# Patient Record
Sex: Female | Born: 1975 | Race: White | Hispanic: No | Marital: Married
Health system: Southern US, Academic
[De-identification: ages and names within clinical notes are randomized; demographics above are authoritative.]

## PROBLEM LIST (undated history)

## (undated) ENCOUNTER — Ambulatory Visit

## (undated) ENCOUNTER — Encounter

## (undated) DIAGNOSIS — T8859XA Other complications of anesthesia, initial encounter: Secondary | ICD-10-CM

## (undated) DIAGNOSIS — I890 Lymphedema, not elsewhere classified: Secondary | ICD-10-CM

## (undated) DIAGNOSIS — M069 Rheumatoid arthritis, unspecified: Secondary | ICD-10-CM

## (undated) DIAGNOSIS — I82409 Acute embolism and thrombosis of unspecified deep veins of unspecified lower extremity: Secondary | ICD-10-CM

## (undated) DIAGNOSIS — K529 Noninfective gastroenteritis and colitis, unspecified: Secondary | ICD-10-CM

## (undated) DIAGNOSIS — K3184 Gastroparesis: Secondary | ICD-10-CM

## (undated) DIAGNOSIS — T4145XA Adverse effect of unspecified anesthetic, initial encounter: Secondary | ICD-10-CM

## (undated) DIAGNOSIS — C55 Malignant neoplasm of uterus, part unspecified: Secondary | ICD-10-CM

## (undated) HISTORY — PX: CHOLECYSTECTOMY: SHX55

## (undated) HISTORY — PX: APPENDECTOMY: SHX54

## (undated) HISTORY — PX: KNEE SURGERY: SHX244

## (undated) HISTORY — DX: Malignant neoplasm of uterus, part unspecified: C55

## (undated) HISTORY — PX: HERNIA REPAIR: SHX51

## (undated) HISTORY — PX: TONSILLECTOMY: SUR1361

## (undated) HISTORY — PX: MINIMALLY INVASIVE FORAMINOTOMY CERVICAL SPINE: SHX2036

## (undated) HISTORY — DX: Acute embolism and thrombosis of unspecified deep veins of unspecified lower extremity: I82.409

## (undated) HISTORY — PX: TOTAL ABDOMINAL HYSTERECTOMY: SHX209

---
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
## 2005-03-01 ENCOUNTER — Ambulatory Visit: Admission: RE | Admit: 2005-03-01 | Discharge: 2005-03-01 | Payer: Self-pay | Admitting: Gynecologic Oncology

## 2005-03-31 ENCOUNTER — Ambulatory Visit: Payer: Self-pay | Admitting: Internal Medicine

## 2005-06-14 ENCOUNTER — Encounter (INDEPENDENT_AMBULATORY_CARE_PROVIDER_SITE_OTHER): Payer: Self-pay | Admitting: Specialist

## 2005-06-14 ENCOUNTER — Ambulatory Visit (HOSPITAL_COMMUNITY): Admission: RE | Admit: 2005-06-14 | Discharge: 2005-06-14 | Payer: Self-pay | Admitting: Gynecologic Oncology

## 2005-06-14 ENCOUNTER — Ambulatory Visit: Admission: RE | Admit: 2005-06-14 | Discharge: 2005-06-14 | Payer: Self-pay | Admitting: Gynecologic Oncology

## 2005-09-06 ENCOUNTER — Ambulatory Visit: Admission: RE | Admit: 2005-09-06 | Discharge: 2005-09-06 | Payer: Self-pay | Admitting: Gynecologic Oncology

## 2005-12-06 ENCOUNTER — Ambulatory Visit: Admission: RE | Admit: 2005-12-06 | Discharge: 2005-12-06 | Payer: Self-pay | Admitting: Gynecologic Oncology

## 2005-12-06 ENCOUNTER — Encounter (INDEPENDENT_AMBULATORY_CARE_PROVIDER_SITE_OTHER): Payer: Self-pay | Admitting: Specialist

## 2005-12-07 ENCOUNTER — Other Ambulatory Visit: Admission: RE | Admit: 2005-12-07 | Discharge: 2005-12-07 | Payer: Self-pay | Admitting: Gynecology

## 2006-02-01 ENCOUNTER — Emergency Department (HOSPITAL_COMMUNITY): Admission: EM | Admit: 2006-02-01 | Discharge: 2006-02-01 | Payer: Self-pay | Admitting: Family Medicine

## 2006-04-05 ENCOUNTER — Emergency Department (HOSPITAL_COMMUNITY): Admission: EM | Admit: 2006-04-05 | Discharge: 2006-04-05 | Payer: Self-pay | Admitting: Family Medicine

## 2006-04-11 ENCOUNTER — Other Ambulatory Visit: Admission: RE | Admit: 2006-04-11 | Discharge: 2006-04-11 | Payer: Self-pay | Admitting: Gynecologic Oncology

## 2006-04-11 ENCOUNTER — Encounter (INDEPENDENT_AMBULATORY_CARE_PROVIDER_SITE_OTHER): Payer: Self-pay | Admitting: *Deleted

## 2006-04-11 ENCOUNTER — Ambulatory Visit: Admission: RE | Admit: 2006-04-11 | Discharge: 2006-04-11 | Payer: Self-pay | Admitting: Gynecologic Oncology

## 2006-05-19 ENCOUNTER — Ambulatory Visit (HOSPITAL_COMMUNITY): Admission: RE | Admit: 2006-05-19 | Discharge: 2006-05-19 | Payer: Self-pay | Admitting: Orthopedic Surgery

## 2006-05-25 ENCOUNTER — Ambulatory Visit (HOSPITAL_COMMUNITY): Admission: RE | Admit: 2006-05-25 | Discharge: 2006-05-25 | Payer: Self-pay | Admitting: Gynecologic Oncology

## 2006-08-06 ENCOUNTER — Emergency Department (HOSPITAL_COMMUNITY): Admission: EM | Admit: 2006-08-06 | Discharge: 2006-08-06 | Payer: Self-pay | Admitting: Family Medicine

## 2006-08-26 ENCOUNTER — Ambulatory Visit (HOSPITAL_COMMUNITY): Admission: RE | Admit: 2006-08-26 | Discharge: 2006-08-26 | Payer: Self-pay | Admitting: Orthopedic Surgery

## 2006-09-13 ENCOUNTER — Ambulatory Visit: Admission: RE | Admit: 2006-09-13 | Discharge: 2006-09-13 | Payer: Self-pay | Admitting: Gynecologic Oncology

## 2006-09-13 ENCOUNTER — Encounter (INDEPENDENT_AMBULATORY_CARE_PROVIDER_SITE_OTHER): Payer: Self-pay | Admitting: *Deleted

## 2006-09-13 ENCOUNTER — Other Ambulatory Visit: Admission: RE | Admit: 2006-09-13 | Discharge: 2006-09-13 | Payer: Self-pay | Admitting: Gynecologic Oncology

## 2006-09-21 ENCOUNTER — Emergency Department (HOSPITAL_COMMUNITY): Admission: EM | Admit: 2006-09-21 | Discharge: 2006-09-21 | Payer: Self-pay | Admitting: Family Medicine

## 2006-12-25 ENCOUNTER — Emergency Department (HOSPITAL_COMMUNITY): Admission: EM | Admit: 2006-12-25 | Discharge: 2006-12-25 | Payer: Self-pay | Admitting: Family Medicine

## 2007-01-11 ENCOUNTER — Ambulatory Visit (HOSPITAL_BASED_OUTPATIENT_CLINIC_OR_DEPARTMENT_OTHER): Admission: RE | Admit: 2007-01-11 | Discharge: 2007-01-11 | Payer: Self-pay | Admitting: Orthopedic Surgery

## 2007-02-28 ENCOUNTER — Ambulatory Visit: Admission: RE | Admit: 2007-02-28 | Discharge: 2007-02-28 | Payer: Self-pay | Admitting: Gynecologic Oncology

## 2007-02-28 ENCOUNTER — Other Ambulatory Visit: Admission: RE | Admit: 2007-02-28 | Discharge: 2007-02-28 | Payer: Self-pay | Admitting: Gynecologic Oncology

## 2007-02-28 ENCOUNTER — Encounter (INDEPENDENT_AMBULATORY_CARE_PROVIDER_SITE_OTHER): Payer: Self-pay | Admitting: *Deleted

## 2007-06-12 ENCOUNTER — Ambulatory Visit (HOSPITAL_COMMUNITY): Admission: RE | Admit: 2007-06-12 | Discharge: 2007-06-12 | Payer: Self-pay | Admitting: Sports Medicine

## 2007-06-29 ENCOUNTER — Emergency Department (HOSPITAL_COMMUNITY): Admission: EM | Admit: 2007-06-29 | Discharge: 2007-06-29 | Payer: Self-pay | Admitting: Family Medicine

## 2007-07-16 ENCOUNTER — Ambulatory Visit: Payer: Self-pay | Admitting: Family Medicine

## 2007-07-16 ENCOUNTER — Telehealth (INDEPENDENT_AMBULATORY_CARE_PROVIDER_SITE_OTHER): Payer: Self-pay | Admitting: *Deleted

## 2007-07-16 DIAGNOSIS — Z9889 Other specified postprocedural states: Secondary | ICD-10-CM | POA: Insufficient documentation

## 2007-07-16 DIAGNOSIS — Z8542 Personal history of malignant neoplasm of other parts of uterus: Secondary | ICD-10-CM | POA: Insufficient documentation

## 2007-07-19 ENCOUNTER — Telehealth (INDEPENDENT_AMBULATORY_CARE_PROVIDER_SITE_OTHER): Payer: Self-pay | Admitting: *Deleted

## 2007-07-20 ENCOUNTER — Ambulatory Visit: Payer: Self-pay | Admitting: Family Medicine

## 2007-07-20 DIAGNOSIS — E28319 Asymptomatic premature menopause: Secondary | ICD-10-CM | POA: Insufficient documentation

## 2007-07-23 ENCOUNTER — Telehealth: Payer: Self-pay | Admitting: Family Medicine

## 2007-07-25 ENCOUNTER — Ambulatory Visit: Payer: Self-pay | Admitting: Family Medicine

## 2007-07-25 LAB — CONVERTED CEMR LAB
Blood in Urine, dipstick: NEGATIVE
Glucose, Urine, Semiquant: NEGATIVE
Nitrite: POSITIVE
Protein, U semiquant: NEGATIVE
Specific Gravity, Urine: <1.005
Urobilinogen, UA: NEGATIVE
WBC Urine, dipstick: NEGATIVE

## 2007-07-27 ENCOUNTER — Ambulatory Visit (HOSPITAL_COMMUNITY): Admission: RE | Admit: 2007-07-27 | Discharge: 2007-07-29 | Payer: Self-pay | Admitting: Specialist

## 2007-09-24 ENCOUNTER — Telehealth (INDEPENDENT_AMBULATORY_CARE_PROVIDER_SITE_OTHER): Payer: Self-pay | Admitting: *Deleted

## 2007-09-25 ENCOUNTER — Ambulatory Visit: Payer: Self-pay | Admitting: Family Medicine

## 2007-09-25 ENCOUNTER — Encounter (INDEPENDENT_AMBULATORY_CARE_PROVIDER_SITE_OTHER): Payer: Self-pay | Admitting: *Deleted

## 2007-09-25 DIAGNOSIS — J019 Acute sinusitis, unspecified: Secondary | ICD-10-CM

## 2007-09-26 ENCOUNTER — Emergency Department (HOSPITAL_COMMUNITY): Admission: EM | Admit: 2007-09-26 | Discharge: 2007-09-26 | Payer: Self-pay | Admitting: Family Medicine

## 2007-11-01 ENCOUNTER — Emergency Department (HOSPITAL_COMMUNITY): Admission: EM | Admit: 2007-11-01 | Discharge: 2007-11-01 | Payer: Self-pay | Admitting: Family Medicine

## 2007-11-02 ENCOUNTER — Telehealth: Payer: Self-pay | Admitting: Family Medicine

## 2007-11-03 ENCOUNTER — Emergency Department (HOSPITAL_COMMUNITY): Admission: EM | Admit: 2007-11-03 | Discharge: 2007-11-03 | Payer: Self-pay | Admitting: Emergency Medicine

## 2007-11-05 ENCOUNTER — Ambulatory Visit: Payer: Self-pay

## 2007-11-09 ENCOUNTER — Encounter: Admission: RE | Admit: 2007-11-09 | Discharge: 2007-11-09 | Payer: Self-pay | Admitting: Internal Medicine

## 2007-11-16 ENCOUNTER — Telehealth: Payer: Self-pay | Admitting: Family Medicine

## 2007-11-18 ENCOUNTER — Emergency Department (HOSPITAL_COMMUNITY): Admission: EM | Admit: 2007-11-18 | Discharge: 2007-11-18 | Payer: Self-pay | Admitting: Family Medicine

## 2007-11-20 ENCOUNTER — Telehealth (INDEPENDENT_AMBULATORY_CARE_PROVIDER_SITE_OTHER): Payer: Self-pay | Admitting: *Deleted

## 2007-11-21 ENCOUNTER — Emergency Department (HOSPITAL_COMMUNITY): Admission: EM | Admit: 2007-11-21 | Discharge: 2007-11-21 | Payer: Self-pay | Admitting: Emergency Medicine

## 2007-12-01 ENCOUNTER — Ambulatory Visit: Payer: Self-pay | Admitting: Family Medicine

## 2007-12-01 DIAGNOSIS — L03039 Cellulitis of unspecified toe: Secondary | ICD-10-CM

## 2007-12-01 DIAGNOSIS — L02619 Cutaneous abscess of unspecified foot: Secondary | ICD-10-CM | POA: Insufficient documentation

## 2007-12-07 ENCOUNTER — Ambulatory Visit (HOSPITAL_COMMUNITY): Admission: RE | Admit: 2007-12-07 | Discharge: 2007-12-07 | Payer: Self-pay | Admitting: Internal Medicine

## 2007-12-07 ENCOUNTER — Ambulatory Visit: Payer: Self-pay | Admitting: Internal Medicine

## 2007-12-17 ENCOUNTER — Telehealth (INDEPENDENT_AMBULATORY_CARE_PROVIDER_SITE_OTHER): Payer: Self-pay | Admitting: *Deleted

## 2007-12-21 ENCOUNTER — Emergency Department (HOSPITAL_COMMUNITY): Admission: EM | Admit: 2007-12-21 | Discharge: 2007-12-21 | Payer: Self-pay | Admitting: Family Medicine

## 2007-12-28 ENCOUNTER — Telehealth: Payer: Self-pay | Admitting: Internal Medicine

## 2007-12-31 ENCOUNTER — Emergency Department (HOSPITAL_COMMUNITY): Admission: EM | Admit: 2007-12-31 | Discharge: 2008-01-01 | Payer: Self-pay | Admitting: Emergency Medicine

## 2008-01-07 ENCOUNTER — Telehealth: Payer: Self-pay | Admitting: Internal Medicine

## 2008-01-07 ENCOUNTER — Encounter
Admission: RE | Admit: 2008-01-07 | Discharge: 2008-01-28 | Payer: Self-pay | Admitting: Physical Medicine & Rehabilitation

## 2008-01-19 ENCOUNTER — Emergency Department (HOSPITAL_COMMUNITY): Admission: EM | Admit: 2008-01-19 | Discharge: 2008-01-19 | Payer: Self-pay | Admitting: Emergency Medicine

## 2008-01-25 ENCOUNTER — Ambulatory Visit: Payer: Self-pay | Admitting: Physical Medicine & Rehabilitation

## 2008-02-05 ENCOUNTER — Encounter: Admission: RE | Admit: 2008-02-05 | Discharge: 2008-05-05 | Payer: Self-pay | Admitting: Anesthesiology

## 2008-02-05 ENCOUNTER — Ambulatory Visit: Payer: Self-pay | Admitting: Anesthesiology

## 2008-02-26 ENCOUNTER — Ambulatory Visit: Payer: Self-pay | Admitting: Physical Medicine & Rehabilitation

## 2008-02-28 ENCOUNTER — Ambulatory Visit: Payer: Self-pay | Admitting: Family Medicine

## 2008-03-03 ENCOUNTER — Ambulatory Visit: Payer: Self-pay | Admitting: Internal Medicine

## 2008-03-03 DIAGNOSIS — M940 Chondrocostal junction syndrome [Tietze]: Secondary | ICD-10-CM

## 2008-03-12 ENCOUNTER — Ambulatory Visit: Payer: Self-pay | Admitting: Physical Medicine & Rehabilitation

## 2008-03-25 ENCOUNTER — Ambulatory Visit: Payer: Self-pay | Admitting: Internal Medicine

## 2008-03-29 ENCOUNTER — Emergency Department (HOSPITAL_COMMUNITY): Admission: EM | Admit: 2008-03-29 | Discharge: 2008-03-29 | Payer: Self-pay | Admitting: Family Medicine

## 2008-04-01 ENCOUNTER — Ambulatory Visit: Payer: Self-pay | Admitting: Anesthesiology

## 2008-04-07 ENCOUNTER — Encounter
Admission: RE | Admit: 2008-04-07 | Discharge: 2008-04-07 | Payer: Self-pay | Admitting: Physical Medicine & Rehabilitation

## 2008-04-09 ENCOUNTER — Telehealth: Payer: Self-pay | Admitting: Internal Medicine

## 2008-04-23 ENCOUNTER — Telehealth (INDEPENDENT_AMBULATORY_CARE_PROVIDER_SITE_OTHER): Payer: Self-pay | Admitting: *Deleted

## 2008-04-29 ENCOUNTER — Ambulatory Visit: Payer: Self-pay | Admitting: Anesthesiology

## 2009-05-27 ENCOUNTER — Encounter (INDEPENDENT_AMBULATORY_CARE_PROVIDER_SITE_OTHER): Payer: Self-pay | Admitting: Gynecologic Oncology

## 2009-05-27 ENCOUNTER — Other Ambulatory Visit: Admission: RE | Admit: 2009-05-27 | Discharge: 2009-05-27 | Payer: Self-pay | Admitting: Gynecologic Oncology

## 2009-05-27 ENCOUNTER — Ambulatory Visit: Admission: RE | Admit: 2009-05-27 | Discharge: 2009-05-27 | Payer: Self-pay | Admitting: Gynecologic Oncology

## 2009-05-28 ENCOUNTER — Ambulatory Visit (HOSPITAL_COMMUNITY): Admission: RE | Admit: 2009-05-28 | Discharge: 2009-05-28 | Payer: Self-pay | Admitting: Gynecologic Oncology

## 2009-09-24 ENCOUNTER — Emergency Department (HOSPITAL_COMMUNITY): Admission: EM | Admit: 2009-09-24 | Discharge: 2009-09-24 | Payer: Self-pay | Admitting: Emergency Medicine

## 2009-10-15 ENCOUNTER — Emergency Department (HOSPITAL_COMMUNITY): Admission: EM | Admit: 2009-10-15 | Discharge: 2009-10-15 | Payer: Self-pay | Admitting: Emergency Medicine

## 2009-11-06 ENCOUNTER — Emergency Department (HOSPITAL_COMMUNITY): Admission: EM | Admit: 2009-11-06 | Discharge: 2009-11-06 | Payer: Self-pay | Admitting: Family Medicine

## 2009-12-21 ENCOUNTER — Emergency Department (HOSPITAL_COMMUNITY): Admission: EM | Admit: 2009-12-21 | Discharge: 2009-12-21 | Payer: Self-pay | Admitting: Emergency Medicine

## 2010-03-25 ENCOUNTER — Emergency Department (HOSPITAL_COMMUNITY): Admission: EM | Admit: 2010-03-25 | Discharge: 2010-03-25 | Payer: Self-pay | Admitting: Family Medicine

## 2010-03-28 ENCOUNTER — Emergency Department (HOSPITAL_COMMUNITY): Admission: EM | Admit: 2010-03-28 | Discharge: 2010-03-28 | Payer: Self-pay | Admitting: Family Medicine

## 2010-04-26 ENCOUNTER — Emergency Department (HOSPITAL_COMMUNITY): Admission: EM | Admit: 2010-04-26 | Discharge: 2010-04-26 | Payer: Self-pay | Admitting: Family Medicine

## 2010-08-18 ENCOUNTER — Emergency Department (HOSPITAL_COMMUNITY): Admission: EM | Admit: 2010-08-18 | Discharge: 2010-08-18 | Payer: Self-pay | Admitting: Family Medicine

## 2010-09-28 ENCOUNTER — Emergency Department (HOSPITAL_COMMUNITY): Admission: EM | Admit: 2010-09-28 | Discharge: 2010-09-28 | Payer: Self-pay | Admitting: Family Medicine

## 2010-09-29 ENCOUNTER — Other Ambulatory Visit: Admission: RE | Admit: 2010-09-29 | Discharge: 2010-09-29 | Payer: Self-pay | Admitting: Gynecologic Oncology

## 2010-09-29 ENCOUNTER — Ambulatory Visit: Admission: RE | Admit: 2010-09-29 | Discharge: 2010-09-29 | Payer: Self-pay | Admitting: Gynecologic Oncology

## 2010-09-30 ENCOUNTER — Ambulatory Visit (HOSPITAL_COMMUNITY): Admission: RE | Admit: 2010-09-30 | Discharge: 2010-09-30 | Payer: Self-pay | Admitting: Gynecologic Oncology

## 2010-10-03 ENCOUNTER — Emergency Department (HOSPITAL_COMMUNITY): Admission: EM | Admit: 2010-10-03 | Discharge: 2010-10-03 | Payer: Self-pay | Admitting: Emergency Medicine

## 2010-10-10 ENCOUNTER — Emergency Department (HOSPITAL_COMMUNITY): Admission: EM | Admit: 2010-10-10 | Discharge: 2010-10-11 | Payer: Self-pay | Admitting: Emergency Medicine

## 2010-10-11 ENCOUNTER — Telehealth: Payer: Self-pay | Admitting: Family Medicine

## 2010-10-11 ENCOUNTER — Ambulatory Visit: Payer: Self-pay | Admitting: Family Medicine

## 2010-10-11 DIAGNOSIS — I82409 Acute embolism and thrombosis of unspecified deep veins of unspecified lower extremity: Secondary | ICD-10-CM | POA: Insufficient documentation

## 2010-10-11 DIAGNOSIS — F411 Generalized anxiety disorder: Secondary | ICD-10-CM | POA: Insufficient documentation

## 2010-10-14 ENCOUNTER — Ambulatory Visit: Payer: Self-pay | Admitting: Family Medicine

## 2010-10-20 ENCOUNTER — Ambulatory Visit: Payer: Self-pay | Admitting: Family Medicine

## 2010-10-20 LAB — CONVERTED CEMR LAB: INR: 2.4

## 2010-11-02 ENCOUNTER — Ambulatory Visit: Payer: Self-pay | Admitting: Family Medicine

## 2010-11-02 LAB — CONVERTED CEMR LAB: INR: 1.6

## 2010-11-03 ENCOUNTER — Emergency Department (HOSPITAL_COMMUNITY): Admission: EM | Admit: 2010-11-03 | Discharge: 2010-11-03 | Payer: Self-pay | Admitting: Emergency Medicine

## 2010-11-09 ENCOUNTER — Encounter (INDEPENDENT_AMBULATORY_CARE_PROVIDER_SITE_OTHER): Payer: Self-pay | Admitting: *Deleted

## 2010-11-24 ENCOUNTER — Ambulatory Visit: Payer: Self-pay | Admitting: Family Medicine

## 2010-11-24 DIAGNOSIS — M25579 Pain in unspecified ankle and joints of unspecified foot: Secondary | ICD-10-CM

## 2010-11-24 LAB — CONVERTED CEMR LAB: INR: 1.5

## 2010-12-06 ENCOUNTER — Telehealth (INDEPENDENT_AMBULATORY_CARE_PROVIDER_SITE_OTHER): Payer: Self-pay | Admitting: *Deleted

## 2010-12-08 ENCOUNTER — Ambulatory Visit: Payer: Self-pay | Admitting: Family Medicine

## 2010-12-15 ENCOUNTER — Emergency Department (HOSPITAL_COMMUNITY)
Admission: EM | Admit: 2010-12-15 | Discharge: 2010-12-15 | Payer: Self-pay | Source: Home / Self Care | Admitting: Emergency Medicine

## 2010-12-26 ENCOUNTER — Emergency Department (HOSPITAL_COMMUNITY)
Admission: EM | Admit: 2010-12-26 | Discharge: 2010-12-26 | Payer: Self-pay | Source: Home / Self Care | Admitting: Family Medicine

## 2010-12-28 ENCOUNTER — Encounter (INDEPENDENT_AMBULATORY_CARE_PROVIDER_SITE_OTHER): Payer: Self-pay | Admitting: *Deleted

## 2010-12-28 ENCOUNTER — Ambulatory Visit
Admission: RE | Admit: 2010-12-28 | Discharge: 2010-12-28 | Payer: Self-pay | Source: Home / Self Care | Attending: Family Medicine | Admitting: Family Medicine

## 2010-12-28 ENCOUNTER — Encounter: Payer: Self-pay | Admitting: Family Medicine

## 2011-01-27 NOTE — Letter (Signed)
Summary: Work Dietitian at [employer]  [address] [place], [place] [address]   Phone: [phone]  Fax: [phone]    Today's Date: October 20, 2010  Name of Patient: [name] Horn  The above named patient had a medical visit today at:  4 pm.  Please take this into consideration when reviewing the time away from work/school.    Special Instructions:  [  ] None  [  ] To be off the remainder of today, returning to the normal work / school schedule tomorrow.  [  ] To be off until the next scheduled appointment on ______________________.  [ x ] Other - can return to work w/out restrictions but should have a chair available for when she has pain in her leg.  Should not work double shifts at this time.   Sincerely yours,   Neena Rhymes MD

## 2011-01-27 NOTE — Assessment & Plan Note (Signed)
Summary: PT/INR//PH  Nurse Visit   Vital Signs:  Patient profile:   35 year old female Height:      64 inches Weight:      205 pounds Temp:     98.6 degrees F oral Pulse rate:   112 / minute BP sitting:   108 / 78  (left arm)  Vitals Entered By: Jeremy Johann CMA (October 14, 2010 3:51 PM)  Patient Instructions: 1)  Please schedule a follow-up appointment for Tuesday 2)  Take 10 mg thurs,fri, sat then 5 mg sun, mon and return to office on tuesday for PT/INR check. 3)  Continue with lovenox daily until recheck  CC: pt/inr, refill Comments [facility] [address]   Current Medications (verified): 1)  Prilosec Otc 20 Mg  Tbec (Omeprazole Magnesium) .Marland Kitchen.. 1 Once Daily 2)  Coumadin 5 Mg Tabs (Warfarin Sodium) .... Take As Directed 3)  Buspirone Hcl 15 Mg Tabs (Buspirone Hcl) .... 1/2 Tab Two Times A Day X1 Week and Then Increase To 1 Tab By Mouth Two Times A Day. 4)  Vicodin 5-500 Mg Tabs (Hydrocodone-Acetaminophen) .Marland Kitchen.. 1 Tab By Mouth Q6 As Needed For Severe Pain 5)  Alprazolam 0.5 Mg Tabs (Alprazolam) .Marland Kitchen.. 1 Tab By Mouth Q4 As Needed For Severe Anxiety 6)  Ventolin Hfa 108 (90 Base) Mcg/act Aers (Albuterol Sulfate) .... 2 Puffs Q4 As Needed For Wheezing  Allergies (verified): 1)  ! Sulfa Laboratory Results   Blood Tests    Date/Time Reported: October 14, 2010 4:08 PM   INR: 1.7   (Normal Range: 0.88-1.12   Therap INR: 2.0-3.5) Comments: Has not taking coumadin today    Orders Added: 1)  Est. Patient Level I [11914] 2)  Protime [[id number]] Prescriptions: LOVENOX 120 MG/0.8ML SOLN (ENOXAPARIN SODIUM) inject 120mg  daily subcutaneously   for 6 days  #1 vial x 0   Entered by:   Jeremy Johann CMA   Authorized by:   Neena Rhymes MD   Signed by:   Jeremy Johann CMA on 10/14/2010   Method used:   Printed then faxed to ...       [facility]  [address] # [id number]* (retail)       [address]       [place], [place]  [address]       Ph: [phone]  or [phone]       Fax: [phone]   RxID:   [id number] ALPRAZOLAM 0.5 MG TABS (ALPRAZOLAM) 1 tab by mouth q4 as needed for severe anxiety  #30 x 0   Entered by:   Jeremy Johann CMA   Authorized by:   Neena Rhymes MD   Signed by:   Jeremy Johann CMA on 10/14/2010   Method used:   Printed then faxed to ...       [facility]  [address] # [id number]* (retail)       [address]       [place], [place]  [address]       Ph: [phone] or [phone]       Fax: [phone]   RxID:   [id number] VICODIN 5-500 MG TABS (HYDROCODONE-ACETAMINOPHEN) 1 tab by mouth Q6 as needed for severe pain  #20 x 0   Entered and Authorized by:   Neena Rhymes MD   Signed by:   Neena Rhymes MD on 10/14/2010   Method used:   Print then Give to Patient  RxID:   [id number]     ANTICOAGULATION RECORD PREVIOUS REGIMEN & LAB RESULTS   Previous INR:  1.7 on  10/11/2010    NEW REGIMEN & LAB RESULTS Current INR: 1.7 Current Coumadin Dose(mg): 5 mg qd Regimen: 10 mg thurs,fri,sat then 5 mg sun, mon  Provider: tabori Repeat testing in: tuesday  Anticoagulation Visit Questionnaire Coumadin dose missed/changed:  No Abnormal Bleeding Symptoms:  No  Any diet changes including alcohol intake, vegetables or greens since the last visit:  No Any illnesses or hospitalizations since the last visit:  No Any signs of clotting since the last visit (including chest discomfort, dizziness, shortness of breath, arm tingling, slurred speech, swelling or redness in leg):  No  MEDICATIONS PRILOSEC OTC 20 MG  TBEC (OMEPRAZOLE MAGNESIUM) 1 once daily COUMADIN 5 MG TABS (WARFARIN SODIUM) take as directed BUSPIRONE HCL 15 MG TABS (BUSPIRONE HCL) 1/2 tab two times a day x1 week and then increase to 1 tab by mouth two times a day. VICODIN 5-500 MG TABS (HYDROCODONE-ACETAMINOPHEN) 1 tab by mouth Q6 as needed for severe pain ALPRAZOLAM 0.5 MG TABS (ALPRAZOLAM) 1 tab by mouth q4 as  needed for severe anxiety VENTOLIN HFA 108 (90 BASE) MCG/ACT AERS (ALBUTEROL SULFATE) 2 puffs q4 as needed for wheezing LOVENOX 120 MG/0.8ML SOLN (ENOXAPARIN SODIUM) inject 120mg  daily subcutaneously   for 6 days

## 2011-01-27 NOTE — Progress Notes (Signed)
Summary: Hydrocodon-Acetaminophen refill  Phone Note Refill Request Message from:  Fax from Pharmacy on December 06, 2010 9:36 AM  Refills Requested: Medication #1:  VICODIN 5-500 MG TABS 1 tab by mouth Q6 as needed for severe pain   [facility] [address], [place], [place]    phone=[phone]  fax =  [phone]   qty = 20  Next Appointment Scheduled: Wed 12/08/2010 = nurse's visit Initial call taken by: Jerolyn Shin,  December 06, 2010 9:37 AM  Follow-up for Phone Call        last filled 11.30.11.......Marland KitchenDoristine Devoid CMA  December 06, 2010 3:12 PM   Additional Follow-up for Phone Call Additional follow up Details #1::        no refills at this time.  if leg is still painful or swollen needs to see ortho. Additional Follow-up by: Neena Rhymes MD,  December 06, 2010 3:37 PM

## 2011-01-27 NOTE — Assessment & Plan Note (Signed)
Summary: [facility] F/U/scm   Vital Signs:  Patient profile:   35 year old female Height:      64 inches Weight:      207 pounds BMI:     35.66 Temp:     98.9 degrees F oral Pulse rate:   115 / minute BP sitting:   110 / 74  (left arm)  Vitals Entered By: Doristine Devoid CMA (October 11, 2010 1:31 PM) CC: hospital f/u- currently doing lovenox has last dose today previously being monitored by oncologist   History of Present Illness: 35 yo woman here today for hospital f/u.  PCP- Lowne.  Onc- Paola Gehrig  1) DVT- dx'd at [facility] on 9/27.  L calf x2.  normal chest CT.  started on Lovenox and coumadin.  wants [place] to follow coumadin.  alternates 5mg  Tues/Thurs/Sat w/ 2.5mg  M/W/F/Sun.  INR was 1.8 yesterday in ER.  took Lovenox again today.  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  Onc stopped hormone replacement (likely the cause of DVT) and stopped Zoloft.  2) Uterine Cancer- following yearly w/ Dr Duard Brady, s/p hysterectomy.  not currently on tx- radiation or chemo.  3) Fever- 3-4 days of low grade temps w/out cause.  no N/V/D.  + fatigue.  no nasal congestion or cough.  some SOB- hx of asthma, not using inhaler.  4) Anxiety- stopped Zoloft due to potential interaction w/ coumadin.  ER gave Xanax.  according to pt's chart, hx of misusing meds.    Current Medications (verified): 1)  Prilosec Otc 20 Mg  Tbec (Omeprazole Magnesium) .Marland Kitchen.. 1 Once Daily 2)  Percocet 5-325 Mg Tabs (Oxycodone-Acetaminophen) .... Take 1 Tablet Every 6 Hours As Needed 3)  Coumadin 5 Mg Tabs (Warfarin Sodium) .... Take As Directed  Allergies (verified): 1)  ! 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   Head:  normocephalic.  no TTP over sinuses Eyes:  no injxn or inflammation Ears:  R ear normal and L ear normal.   Nose:  mild congestion Mouth:  pharynx pink and moist.   Neck:  No deformities, masses, or tenderness noted. Lungs:  normal respiratory effort, no intercostal retractions, no accessory muscle use, and normal breath sounds.   Heart:  normal rate, regular rhythm, and no murmur.   Abdomen:  soft, NT/ND, +BS Pulses:  +2 carotid, radial, DP Extremities:  + TTP over L calf, no edema or erythema Cervical Nodes:  No lymphadenopathy noted Psych:  mildly anxious   Impression & Recommendations:  Problem # 1:  DVT (ICD-453.40) Assessment New this is pt's 2nd LE DVT.  no evidence of PE on CT yesterday.  will need lifetime coumadin tx.  given INR of 1.8 should continue Lovenox injxns.  given pain pt can take vicodin as needed but this should be short term. Orders: Protime ([id number])  Problem # 2:  UTERINE CANCER, HX OF (ICD-V10.42) Assessment: New following w/ oncology yearly.  Problem # 3:  FEVER (  ICD-780.60) Assessment: New likely viral given pt's vague sxs and lack of apparent bacterial infxn on exam.  temp normal in office today.  reviewed supportive care and red flags that should prompt return.  Pt expresses understanding and is in agreement w/ this plan.  Problem # 4:  ANXIETY (ICD-300.00) Assessment: New pt needs controller med rather than rescue medication.  since zoloft was stopped will start Buspar.  xanax only as needed for extreme anxiety.  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 Her updated medication list for this problem  includes:    Buspirone Hcl 15 Mg Tabs (Buspirone hcl) .Marland Kitchen... 1/2 tab two times a day x1 week and then increase to 1 tab by mouth two times a day.    Alprazolam 0.5 Mg Tabs (Alprazolam) .Marland Kitchen... 1 tab by mouth q4 as needed for severe anxiety  Complete Medication List: 1)  Prilosec Otc 20 Mg Tbec (Omeprazole magnesium) .Marland Kitchen.. 1 once daily 2)  Percocet 5-325 Mg Tabs (Oxycodone-acetaminophen) .... Take 1 tablet every 6 hours as needed 3)  Coumadin 5 Mg Tabs (Warfarin sodium) .... Take as directed 4)  Buspirone Hcl 15 Mg Tabs (Buspirone hcl) .... 1/2 tab two times a day x1 week and then increase to 1 tab by mouth two times a day. 5)  Vicodin 5-500 Mg Tabs (Hydrocodone-acetaminophen) .Marland Kitchen.. 1 tab by mouth q6 as needed for severe pain 6)  Alprazolam 0.5 Mg Tabs (Alprazolam) .Marland Kitchen.. 1 tab by mouth q4 as needed for severe anxiety 7)  Ventolin Hfa 108 (90 Base) Mcg/act Aers (Albuterol sulfate) .... 2 puffs q4 as needed for wheezing  Patient Instructions: 1)  Schedule a PT check for Thursday 2)  Schedule your complete physical w/ me at your convenience- do not eat before this appt 3)  Take 5 mg until you follow up on Thursday 4)  Continue the Lovenox until Thursday (or you run out) 5)  Start the Buspirone as directed for anxiety 6)  Only use the Alprazolam for panic attacks 7)  The Vicodin is only for severe pain- otherwise, use tylenol 8)  Heating pad will help with your leg pain 9)  Your low grade temp appears to be viral b/c your blood work from yesterday was all normal 10)  Call with any questions or concerns 11)  Hang in there!! Prescriptions: BUSPIRONE HCL 15 MG TABS (BUSPIRONE HCL) 1/2 tab two times a day x1 week and then increase to 1 tab by mouth two times a day.  #60 x 3   Entered and Authorized by:   Neena Rhymes MD   Signed by:   Neena Rhymes MD on 10/11/2010   Method used:   Reprint   RxID:   [id number] VENTOLIN HFA 108 (90 BASE) MCG/ACT AERS (ALBUTEROL SULFATE) 2 puffs q4 as  needed for wheezing  #1 x 3   Entered and Authorized by:   Neena Rhymes MD   Signed by:   Neena Rhymes MD on 10/11/2010   Method used:   Print then Give to Patient   RxID:   [id number] ALPRAZOLAM 0.5 MG TABS (ALPRAZOLAM) 1 tab by mouth q4 as needed for severe anxiety  #15 x 0   Entered and Authorized by:   Neena Rhymes MD   Signed by:   Neena Rhymes MD on 10/11/2010   Method used:   Print then Give to Patient   RxID:   [id number] VICODIN 5-500 MG TABS (HYDROCODONE-ACETAMINOPHEN) 1 tab by mouth Q6 as needed  for severe pain  #20 x 0   Entered and Authorized by:   Neena Rhymes MD   Signed by:   Neena Rhymes MD on 10/11/2010   Method used:   Print then Give to Patient   RxID:   [id number] BUSPIRONE HCL 15 MG TABS (BUSPIRONE HCL) 1/2 tab two times a day x1 week and then increase to 1 tab by mouth two times a day.  #60 x 3   Entered and Authorized by:   Neena Rhymes MD   Signed by:   Neena Rhymes MD on 10/11/2010   Method used:   Electronically to        [phone][facility] (retail)       [address]       [place], [place]  [address]       Ph: [phone]       Fax: [phone]   RxID:   [id number]    Orders Added: 1)  Protime [[id number]] 2)  Est. Patient Level IV [34742]    Laboratory Results   Blood Tests      INR: 1.7   (Normal Range: 0.88-1.12   Therap INR: 2.0-3.5)

## 2011-01-27 NOTE — Letter (Signed)
Summary: Generic Letter  [facility]  [address] [place], [place] [address]   Phone: [phone]  Fax: [phone]    12/28/2010  Yareni Cordy [address] [place], [place]  [address]  To Whom It May Concern,  Ms Oley has not been taking her Coumadin as directed and as of today her INR is 1 (normal).  This means there is absolutely no increased risk of bleeding.   Sincerely,   Neena Rhymes MD

## 2011-01-27 NOTE — Letter (Signed)
Summary: [facility] No Show Letter  [facility]  [address] [place], [place] [address]   Phone: [phone]  Fax: [phone]    11/09/2010 MRN: [id number]  Aubreyana Piascik [address] [place], [place]  [address]   Dear Ms. Horger,   Our records indicate that you missed your scheduled appointment with ______Dr.Tabori___________ on __11/15/11_________.  Please contact this office to reschedule your appointment as soon as possible.  It is important that you keep your scheduled appointments with your physician, so we can provide you the best care possible.  Please be advised that there may be a charge for "no show" appointments.    Sincerely,   [facility]

## 2011-01-27 NOTE — Assessment & Plan Note (Signed)
Summary: PT CHECK//PH  Nurse Visit   Vital Signs:  Patient profile:   35 year old female Height:      64 inches (162.56 cm) Weight:      202.25 pounds (91.93 kg) Temp:     98.6 degrees F (37.00 degrees C) oral  Vitals Entered By: Lucious Groves CMA (November 02, 2010 4:19 PM) CC: PT check./kb   Allergies: 1)  ! Sulfa Laboratory Results   Blood Tests      INR: 1.6   (Normal Range: 0.88-1.12   Therap INR: 2.0-3.5)    Orders Added: 1)  Est. Patient Level I [[address]] 2)  Protime [[id number]]   ANTICOAGULATION RECORD PREVIOUS REGIMEN & LAB RESULTS   Previous INR:  2.4 on  10/20/2010 Previous Coumadin Dose(mg):  5 mg qd on  10/20/2010 Previous Regimen:  (5mg ) 1 tab MWF,(2.5 mg) 1/2 tab all other days on  10/20/2010  NEW REGIMEN & LAB RESULTS Anticoag. Dx: Deep venous thrombosis Current INR: 1.6 Current Coumadin Dose(mg): 2.5mg  on W,F,Sun and 5mg  all other days Regimen: see below  Provider: Lowne Repeat testing in: 1 week Other Comments: Patient notes that she has missed approx. 4 doses and she has 5mg  tabs at home. She takes 2.5mg  on W,F,Sun and 5mg  all other days. Lucious Groves CMA  November 02, 2010 4:22 PM   Per Dr. Laury Axon, the patient will take 1.5 tabs today, 1 tab tomorrow, and then resume her previous schedule. Re-check in one week, any additional issues must be discussed with Dr. Beverely Low. Lucious Groves CMA  November 02, 2010 4:27 PM   Dose has been reviewed with patient or caretaker during this visit. Reviewed by: Lucious Groves  Anticoagulation Visit Questionnaire Coumadin dose missed/changed:  Yes Coumadin Dose Comments:  one or more missed dose(s) Abnormal Bleeding Symptoms:  No  Any diet changes including alcohol intake, vegetables or greens since the last visit:  No Any illnesses or hospitalizations since the last visit:  Yes      Recent Illness/Hospitalizations:  Patient notes that she has been very sick this week and missed several doses (approx 4) Any signs of  clotting since the last visit (including chest discomfort, dizziness, shortness of breath, arm tingling, slurred speech, swelling or redness in leg):  Yes      Signs of Clotting:  Patient notes that she continues to have pain and swelling of her leg. Adverse Events: Patient also notes still having issues with panic attacks  MEDICATIONS PRILOSEC OTC 20 MG  TBEC (OMEPRAZOLE MAGNESIUM) 1 once daily COUMADIN 5 MG TABS (WARFARIN SODIUM) take as directed BUSPIRONE HCL 15 MG TABS (BUSPIRONE HCL) 1/2 tab two times a day x1 week and then increase to 1 tab by mouth two times a day. VICODIN 5-500 MG TABS (HYDROCODONE-ACETAMINOPHEN) 1 tab by mouth Q6 as needed for severe pain ALPRAZOLAM 0.5 MG TABS (ALPRAZOLAM) 1 tab by mouth q4 as needed for severe anxiety VENTOLIN HFA 108 (90 BASE) MCG/ACT AERS (ALBUTEROL SULFATE) 2 puffs q4 as needed for wheezing

## 2011-01-27 NOTE — Assessment & Plan Note (Signed)
Summary: coumidin check///sph  Nurse Visit   Vital Signs:  Patient profile:   35 year old female Height:      64 inches Weight:      189 pounds Pulse rate:   78 / minute BP sitting:   106 / 76  (left arm)  Vitals Entered By: Jeremy Johann CMA (December 28, 2010 2:26 PM)  Patient Instructions: 1)  SCHEDULE A PT CHECK IN 2 WEEKS 2)  Please resume your coumadin immediately after your tooth extraction- take 2 pills daily 3)  You have had multiple blood clots and failure to take your coumadin as directed and follow up when scheduled could result in serious consequences- such as stroke, heart attack, and death. 4)  You can receive pain medicine from the dentist doing your tooth extraction 5)  Good luck with your procedure  CC: PT check   Current Medications (verified): 1)  Prilosec Otc 20 Mg  Tbec (Omeprazole Magnesium) .Marland Kitchen.. 1 Once Daily 2)  Coumadin 5 Mg Tabs (Warfarin Sodium) .... Take As Directed 3)  Buspirone Hcl 15 Mg Tabs (Buspirone Hcl) .... 1/2 Tab Two Times A Day X1 Week and Then Increase To 1 Tab By Mouth Two Times A Day. 4)  Vicodin 5-500 Mg Tabs (Hydrocodone-Acetaminophen) .Marland Kitchen.. 1 Tab By Mouth Q6 As Needed For Severe Pain 5)  Alprazolam 0.5 Mg Tabs (Alprazolam) .Marland Kitchen.. 1 Tab By Mouth Q4 As Needed For Severe Anxiety 6)  Ventolin Hfa 108 (90 Base) Mcg/act Aers (Albuterol Sulfate) .... 2 Puffs Q4 As Needed For Wheezing  Allergies (verified): 1)  ! Sulfa Laboratory Results   Blood Tests      INR: 1.1   (Normal Range: 0.88-1.12   Therap INR: 2.0-3.5)    Orders Added: 1)  Est. Patient Level I [08657] 2)  Protime [[id number]]    ANTICOAGULATION RECORD PREVIOUS REGIMEN & LAB RESULTS Anticoagulation Diagnosis:  Deep venous thrombosis on  11/02/2010  Previous INR:  1.5 on  11/24/2010 Previous Coumadin Dose(mg):  1 tab daily, 1 1/2 tab M,W,F on  11/24/2010 Previous Regimen:  see below on  11/02/2010  NEW REGIMEN & LAB RESULTS Current INR: 1.1 Current Coumadin  Dose(mg): (7.5 mg) 1 1/2 tab daily Regimen: see below  (no change)   Anticoagulation Visit Questionnaire Coumadin dose missed/changed:  Yes Coumadin Dose Comments:  one or more missed dose(s) Abnormal Bleeding Symptoms:  Yes    Bruising or bleeding from nose or gums, in urine or stool since the last visit:  gum bleeding with brushing Any diet changes including alcohol intake, vegetables or greens since the last visit:  Yes      Diet Comments:ate greens 2 day this week Any illnesses or hospitalizations since the last visit:  No Any signs of clotting since the last visit (including chest discomfort, dizziness, shortness of breath, arm tingling, slurred speech, swelling or redness in leg):  No  MEDICATIONS PRILOSEC OTC 20 MG  TBEC (OMEPRAZOLE MAGNESIUM) 1 once daily COUMADIN 5 MG TABS (WARFARIN SODIUM) take as directed BUSPIRONE HCL 15 MG TABS (BUSPIRONE HCL) 1/2 tab two times a day x1 week and then increase to 1 tab by mouth two times a day. VICODIN 5-500 MG TABS (HYDROCODONE-ACETAMINOPHEN) 1 tab by mouth Q6 as needed for severe pain ALPRAZOLAM 0.5 MG TABS (ALPRAZOLAM) 1 tab by mouth q4 as needed for severe anxiety VENTOLIN HFA 108 (90 BASE) MCG/ACT AERS (ALBUTEROL SULFATE) 2 puffs q4 as needed for wheezing

## 2011-01-27 NOTE — Assessment & Plan Note (Signed)
Summary: PT/INR/PH  Nurse Visit   Vital Signs:  Patient profile:   35 year old female Height:      64 inches Weight:      212 pounds Temp:     98.4 degrees F oral Pulse rate:   82 / minute BP sitting:   100 / 78  (left arm)  Vitals Entered By: Jeremy Johann CMA (October 20, 2010 4:21 PM) CC: pt/inr   Patient Instructions: 1)  Please schedule a follow-up appointment in 1 weeks for PT/INR.  2)  New coumadin dose (5mg ) 1 tab MWF,(2.5 mg) 1/2 tab all other days. 3)  Stop Lovenox. 4)  Take tylenol as needed for pain.   Current Medications (verified): 1)  Prilosec Otc 20 Mg  Tbec (Omeprazole Magnesium) .Marland Kitchen.. 1 Once Daily 2)  Coumadin 5 Mg Tabs (Warfarin Sodium) .... Take As Directed 3)  Buspirone Hcl 15 Mg Tabs (Buspirone Hcl) .... 1/2 Tab Two Times A Day X1 Week and Then Increase To 1 Tab By Mouth Two Times A Day. 4)  Vicodin 5-500 Mg Tabs (Hydrocodone-Acetaminophen) .Marland Kitchen.. 1 Tab By Mouth Q6 As Needed For Severe Pain 5)  Alprazolam 0.5 Mg Tabs (Alprazolam) .Marland Kitchen.. 1 Tab By Mouth Q4 As Needed For Severe Anxiety 6)  Ventolin Hfa 108 (90 Base) Mcg/act Aers (Albuterol Sulfate) .... 2 Puffs Q4 As Needed For Wheezing  Allergies (verified): 1)  ! Sulfa Laboratory Results   Blood Tests    Date/Time Reported: October 20, 2010 4:22 PM   INR: 2.4   (Normal Range: 0.88-1.12   Therap INR: 2.0-3.5)    Orders Added: 1)  Est. Patient Level I [47829] 2)  Protime [[id number]]  Prevention & Chronic Care Immunizations   Influenza vaccine: Not documented    Tetanus booster: Not documented    Pneumococcal vaccine: Not documented  Other Screening   Pap smear: Not documented   Smoking status: never  (07/16/2007)  Lipids   Total Cholesterol: Not documented   LDL: Not documented   LDL Direct: Not documented   HDL: Not documented   Triglycerides: Not documented    ANTICOAGULATION RECORD PREVIOUS REGIMEN & LAB RESULTS   Previous INR:  1.7 on  10/14/2010 Previous Coumadin  Dose(mg):  5 mg qd on  10/14/2010 Previous Regimen:  10 mg thurs,fri,sat then 5 mg sun, mon on  10/14/2010  NEW REGIMEN & LAB RESULTS Current INR: 2.4 Current Coumadin Dose(mg): 5 mg qd Regimen: (5mg ) 1 tab MWF,(2.5 mg) 1/2 tab all other days  Provider: tabori Repeat testing in: repeat 1 week  Anticoagulation Visit Questionnaire Coumadin dose missed/changed:  No Abnormal Bleeding Symptoms:  No  Any diet changes including alcohol intake, vegetables or greens since the last visit:  No Any illnesses or hospitalizations since the last visit:  No Any signs of clotting since the last visit (including chest discomfort, dizziness, shortness of breath, arm tingling, slurred speech, swelling or redness in leg):  No  MEDICATIONS PRILOSEC OTC 20 MG  TBEC (OMEPRAZOLE MAGNESIUM) 1 once daily COUMADIN 5 MG TABS (WARFARIN SODIUM) take as directed BUSPIRONE HCL 15 MG TABS (BUSPIRONE HCL) 1/2 tab two times a day x1 week and then increase to 1 tab by mouth two times a day. VICODIN 5-500 MG TABS (HYDROCODONE-ACETAMINOPHEN) 1 tab by mouth Q6 as needed for severe pain ALPRAZOLAM 0.5 MG TABS (ALPRAZOLAM) 1 tab by mouth q4 as needed for severe anxiety VENTOLIN HFA 108 (90 BASE) MCG/ACT AERS (ALBUTEROL SULFATE) 2 puffs q4 as needed for wheezing

## 2011-01-27 NOTE — Assessment & Plan Note (Signed)
Summary: PT CHECK/KN  Nurse Visit   Vital Signs:  Patient profile:   35 year old female Height:      64 inches Weight:      194 pounds BMI:     33.42 Pulse rate:   68 / minute BP sitting:   110 / 86  (left arm)  Vitals Entered By: Jeremy Johann CMA (November 24, 2010 4:39 PM) CC: PT/INR, pain and swelling in leg, refill pain med Comments Pt took med on Friday but vomited shortly afterward...Marland KitchenMarland KitchenFelecia Deloach CMA  November 24, 2010 4:43 PM    History of Present Illness: 35 yo woman here today for  1) Coumadin- subtherapeutic.  reports she vomited 1 dose and possibly missed another dose but otherwise has been compliant.  missed last PT check.    2) leg pain- L leg, from just below calf down to ankle.  having associated ankle swelling.  swelling will improve w/ elevation.  no known injury.  reports pain has been worsening over last couple weeks. has been making frequent trips to [facility].  no change in activity level.   has been taking coumadin w/out relief.   Review of Systems      See HPI   Patient Instructions: 1)  Please schedule a follow-up appointment in 2 weeks for PT check 2)  Increase Coumadin to 7.5mg  daily 3)  Your ankle is inflammed- continue to ice and elevate as able.  Wear your supportive shoes 4)  Continue the tylenol, vicodin for severe pain 5)  If no improvement in the next 2 weeks- call and we'll send you to ortho 6)  Hang in there!!!   Physical Exam  General:  alert, well-developed, and well-nourished.   Msk:  + TTP over L lateral malleolus w/ edema inferiorly and anteriorly.  full ROM.  no bruising or erythema.  (-) Homan's Pulses:  +2 DP/PT   Impression & Recommendations:  Problem # 1:  ANKLE, PAIN (ICD-719.47) Assessment New pt's pain doesn't appear to be related to DVT.  appears to be tendonitis or mild sprain.  unable to use NSAIDs due to coumadin (although she is subtherapeutic).  continue tylenol, vicodin only as needed for severe  pain.  reviewed supportive care and red flags that should prompt return. Pt expresses understanding and is in agreement w/ this plan.  Problem # 2:  ENCOUNTER FOR LONG-TERM USE OF ANTICOAGULANTS (ICD-V58.61) Assessment: Unchanged subtherapeutic.  increase dose and recheck in 2 weeks. Orders: Protime ([id number])  Complete Medication List: 1)  Prilosec Otc 20 Mg Tbec (Omeprazole magnesium) .Marland Kitchen.. 1 once daily 2)  Coumadin 5 Mg Tabs (Warfarin sodium) .... Take as directed 3)  Buspirone Hcl 15 Mg Tabs (Buspirone hcl) .... 1/2 tab two times a day x1 week and then increase to 1 tab by mouth two times a day. 4)  Vicodin 5-500 Mg Tabs (Hydrocodone-acetaminophen) .Marland Kitchen.. 1 tab by mouth q6 as needed for severe pain 5)  Alprazolam 0.5 Mg Tabs (Alprazolam) .Marland Kitchen.. 1 tab by mouth q4 as needed for severe anxiety 6)  Ventolin Hfa 108 (90 Base) Mcg/act Aers (Albuterol sulfate) .... 2 puffs q4 as needed for wheezing    Current Medications (verified): 1)  Prilosec Otc 20 Mg  Tbec (Omeprazole Magnesium) .Marland Kitchen.. 1 Once Daily 2)  Coumadin 5 Mg Tabs (Warfarin Sodium) .... Take As Directed 3)  Buspirone Hcl 15 Mg Tabs (Buspirone Hcl) .... 1/2 Tab Two Times A Day X1 Week and Then Increase To 1 Tab By Mouth Two Times  A Day. 4)  Vicodin 5-500 Mg Tabs (Hydrocodone-Acetaminophen) .Marland Kitchen.. 1 Tab By Mouth Q6 As Needed For Severe Pain 5)  Alprazolam 0.5 Mg Tabs (Alprazolam) .Marland Kitchen.. 1 Tab By Mouth Q4 As Needed For Severe Anxiety 6)  Ventolin Hfa 108 (90 Base) Mcg/act Aers (Albuterol Sulfate) .... 2 Puffs Q4 As Needed For Wheezing  Allergies (verified): 1)  ! Sulfa Laboratory Results   Blood Tests      INR: 1.5   (Normal Range: 0.88-1.12   Therap INR: 2.0-3.5)    Orders Added: 1)  Protime [[id number]] 2)  Est. Patient Level III [16109] Prescriptions: VICODIN 5-500 MG TABS (HYDROCODONE-ACETAMINOPHEN) 1 tab by mouth Q6 as needed for severe pain  #20 x 0   Entered and Authorized by:   Neena Rhymes MD   Signed by:   Neena Rhymes MD on 11/24/2010   Method used:   Print then Give to Patient   RxID:   [id number]     ANTICOAGULATION RECORD PREVIOUS REGIMEN & LAB RESULTS Anticoagulation Diagnosis:  Deep venous thrombosis on  11/02/2010  Previous INR:  1.6 on  11/02/2010 Previous Coumadin Dose(mg):  2.5mg  on W,F,Sun and 5mg  all other days on  11/02/2010 Previous Regimen:  see below on  11/02/2010  NEW REGIMEN & LAB RESULTS Current INR: 1.5 Current Coumadin Dose(mg): 1 tab daily, 1 1/2 tab M,W,F Regimen: see below  (no change)   Anticoagulation Visit Questionnaire Coumadin dose missed/changed:  Yes Coumadin Dose Comments:  one or more missed dose(s) Abnormal Bleeding Symptoms:  No  Any diet changes including alcohol intake, vegetables or greens since the last visit:  No Any illnesses or hospitalizations since the last visit:  No Any signs of clotting since the last visit (including chest discomfort, dizziness, shortness of breath, arm tingling, slurred speech, swelling or redness in leg):  No  MEDICATIONS PRILOSEC OTC 20 MG  TBEC (OMEPRAZOLE MAGNESIUM) 1 once daily COUMADIN 5 MG TABS (WARFARIN SODIUM) take as directed BUSPIRONE HCL 15 MG TABS (BUSPIRONE HCL) 1/2 tab two times a day x1 week and then increase to 1 tab by mouth two times a day. VICODIN 5-500 MG TABS (HYDROCODONE-ACETAMINOPHEN) 1 tab by mouth Q6 as needed for severe pain ALPRAZOLAM 0.5 MG TABS (ALPRAZOLAM) 1 tab by mouth q4 as needed for severe anxiety VENTOLIN HFA 108 (90 BASE) MCG/ACT AERS (ALBUTEROL SULFATE) 2 puffs q4 as needed for wheezing

## 2011-01-27 NOTE — Letter (Signed)
Summary: Out of Work  [facility]  [address] [place], [place] [address]   Phone: [phone]  Fax: [phone]    October 11, 2010   Employee:  Kadeshia M Hoelzer    To Whom It May Concern:   For Medical reasons, please excuse the above named employee from work for the following dates:  Start:   10/16  End:   10/20  If you need additional information, please feel free to contact our office.         Sincerely,    Neena Rhymes MD
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
## 2011-01-27 NOTE — Letter (Signed)
Summary: Primary Care Appointment Letter  [facility] [place]  [address] [place], [place] [address]   Phone: [phone]  Fax: [phone]    12/28/2010 MRN: [id number]  [name] Horn [address] [place], [place]  [address]  Dear Ms. Kinner,   Your Primary Care Physician Neena Rhymes MD has indicated that:    _______it is time to schedule an appointment.    ___X____you missed your appointment on_12/14/11 and need to call and          reschedule APPT YOU ARE PAST DUE FOR COUMADIN CHECK IT WAS LAST CHECKED 11/24/10. IT IS IMPORTANT THAT YOU F/U IMMEDIATELY.       Please call our office as soon as possible. Our phone number is [phone]          __[phone]______. Our office is open 8a-5p, Monday through Friday.     Thank you,    [facility] Primary Care Scheduler

## 2011-02-06 ENCOUNTER — Emergency Department (HOSPITAL_COMMUNITY)
Admission: EM | Admit: 2011-02-06 | Discharge: 2011-02-06 | Disposition: A | Discharge status: Home / Self Care | Payer: Self-pay | Attending: Emergency Medicine | Admitting: Emergency Medicine

## 2011-02-06 DIAGNOSIS — Z923 Personal history of irradiation: Secondary | ICD-10-CM | POA: Insufficient documentation

## 2011-02-06 DIAGNOSIS — J45909 Unspecified asthma, uncomplicated: Secondary | ICD-10-CM | POA: Insufficient documentation

## 2011-02-06 DIAGNOSIS — K089 Disorder of teeth and supporting structures, unspecified: Secondary | ICD-10-CM | POA: Insufficient documentation

## 2011-02-06 DIAGNOSIS — Z8542 Personal history of malignant neoplasm of other parts of uterus: Secondary | ICD-10-CM | POA: Insufficient documentation

## 2011-02-06 DIAGNOSIS — F411 Generalized anxiety disorder: Secondary | ICD-10-CM | POA: Insufficient documentation

## 2011-03-09 LAB — CBC
HCT: 38.2 % (ref 36.0–46.0)
MCH: 34.9 pg — ABNORMAL HIGH (ref 26.0–34.0)
MCHC: 34.7 g/dL (ref 30.0–36.0)
MCV: 100.5 fL — ABNORMAL HIGH (ref 78.0–100.0)
Platelets: 222 10*3/uL (ref 150–400)
RDW: 13.7 % (ref 11.5–15.5)

## 2011-03-09 LAB — PROTIME-INR
INR: 1.88 — ABNORMAL HIGH (ref 0.00–1.49)
Prothrombin Time: 21.8 seconds — ABNORMAL HIGH (ref 11.6–15.2)
Prothrombin Time: 30.9 seconds — ABNORMAL HIGH (ref 11.6–15.2)

## 2011-03-09 LAB — BASIC METABOLIC PANEL
BUN: 7 mg/dL (ref 6–23)
Chloride: 106 mEq/L (ref 96–112)
Creatinine, Ser: 0.77 mg/dL (ref 0.4–1.2)
Glucose, Bld: 113 mg/dL — ABNORMAL HIGH (ref 70–99)
Potassium: 4.3 mEq/L (ref 3.5–5.1)

## 2011-03-09 LAB — DIFFERENTIAL
Eosinophils Absolute: 0.2 10*3/uL (ref 0.0–0.7)
Eosinophils Relative: 2 % (ref 0–5)
Monocytes Absolute: 0.5 10*3/uL (ref 0.1–1.0)

## 2011-03-10 LAB — PROTIME-INR
INR: 2.21 — ABNORMAL HIGH (ref 0.00–1.49)
Prothrombin Time: 24.7 seconds — ABNORMAL HIGH (ref 11.6–15.2)

## 2011-04-01 LAB — POCT I-STAT, CHEM 8
BUN: 19 mg/dL (ref 6–23)
Creatinine, Ser: 0.6 mg/dL (ref 0.4–1.2)
Glucose, Bld: 96 mg/dL (ref 70–99)
Hemoglobin: 15.6 g/dL — ABNORMAL HIGH (ref 12.0–15.0)
TCO2: 26 mmol/L (ref 0–100)

## 2011-04-04 LAB — URINALYSIS, ROUTINE W REFLEX MICROSCOPIC
Bilirubin Urine: NEGATIVE
Glucose, UA: NEGATIVE mg/dL
Hgb urine dipstick: NEGATIVE
Protein, ur: NEGATIVE mg/dL
Specific Gravity, Urine: 1.006 (ref 1.005–1.030)
Urobilinogen, UA: 1 mg/dL (ref 0.0–1.0)

## 2011-04-04 LAB — URINE CULTURE: Colony Count: NO GROWTH

## 2011-04-04 LAB — URINE MICROSCOPIC-ADD ON
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
## 2011-05-10 NOTE — Assessment & Plan Note (Signed)
[facility]                                 ON-CALL NOTE   NAME:[name] Horn, [name] Horn                        MRN:          [id number]  DATE:12/27/2007                            DOB:          1976/01/31    DATE OF INTERACTION:  December 26, 2007, at 3:55 p.m.   PHONE NUMBER:  [phone]   Had a question about medicines.  She is on medication for a toe  infection, worried about osteomyelitis, is to see an orthopedic doctor  but has not seen him yet.  Has a past balance due with them from an  insurance problem and therefore has not seen orthopedics yet.  Dr. Drue Novel  has been giving her pain medications, according to her.  She left a  message with them this morning to try and get pain medicine and did not  hear back.  I actually checked in the EMR and she has been prescribed  Vicodin ES.  I see no phone note from today, however, but called in #15  Vicodin ES for her at [facility], at [phone], to be taken  until she can call the office.   PRIMARY CARE Damani Kelemen:  Dr. Drue Novel   HOME OFFICE:  Laren Boom, MD  Electronically Signed    RNS/MedQ  DD: 12/27/2007  DT: 12/27/2007  Job #: [id number]

## 2011-05-10 NOTE — Group Therapy Note (Signed)
REFERRING PHYSICIAN:  Kerrin Horn, M.D.   A 35 year old female who has neck pain onset in May of 2008.  She  initially told me that it was about a year ago.  Other documentation  indicates perhaps June of 2008.   CHIEF COMPLAINT:  Neck pain, left shoulder numbness, and left arm pain.   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  She was seen  by Dr. Farris Has from [facility].  She did see a  neurosurgeon as well, although I do not have the name.  She is treated  with Vicodin.  She was sent for MRI of the cervical spine on June 12, 2007, which showed disk protrusion at C6-7 with left foraminal  encroachment combined with osteophyte formation.  There was some mild  left foraminal narrowing at left C5-6 with a small central disk.  The  remainder of the levels showed no foraminal stenosis or central  stenosis.  She was seen by Dr. Otelia Sergeant in initial consultation who  recommended trial of Lyrica, Tramadol, and Vicodin.  The patient was  already tried on Soma and ibuprofen by that time.  She was noted to have  some mild weakness in left triceps function with wrist flexion.  She  underwent foraminotomy left C6-7 and left C7-T1 on July 27, 2007, and  postoperatively placed on OxyContin 20 mg and oxycodone.  She had no  postoperative complications.  She continued on Percocet for about one  month postoperatively and then switched to hydrocodone.  She was okay  for return to work half days after one month and then full-time after  about six weeks.  She worked until December, but states that she was let  go because of too many medical absences.  She was then placed on  restrictions for no more than 10-15 pound overhead lifting.  Goals were  for her to get off of pain medications.  The patient states that she  would not mind  getting off of her medicines.   Her average pain is rated 7-8 out of 10, interferes with activity at a  moderate level.  Sleep is poor.  The pain is worse with standing,  improves with rest and head as well as medications.  Relief from  medications is good.  She is able to climb steps.  She is able to drive.  She is independent with all self-care.  She needs some assistance with  certain household duties.   CURRENT MEDICATIONS:  1. Nortrel 7/7/7.  2. Phenoperidine t.i.d.  3. Tramadol 50 mg one p.o. q.6 hours. She really just takes about      three a day.  4. Hydrocodone 7.5 one to two q.8.  She really just takes about three      a day on average.  5. Over-the-counter Prilosec.  6. Ibuprofen 800 mg daily.   FURTHER TREATMENTS TRIED:  She has not had any formal physical therapy.  She has not had any TENS unit.  She indicates poor sleep due to pain in  the neck.   PAST SURGICAL HISTORY:  Right knee arthroscopy per [name] Horn. [name] Horn,  M.D. in January of 2008.  She had a TAH, BSO for endometrial stromal  tumor in 2004.   SOCIAL HISTORY:  Married.  She lives with her husband.  She has about  three glasses of wine a week.   FAMILY HISTORY:  Heart disease and high blood pressure.   Review of echart shows multiple ER visits for a number of recurrent  problems including ingrown toe nail, recurrent UTI, and cough.  She does  have a primary physician, but she does not always see them.   PHYSICAL EXAMINATION:  VITAL SIGNS:  Blood pressure 121/83, pulse 111,  respirations 18, O2 saturation 99% on room air.  GENERAL:  Mildly anxious female in no acute distress.  Alert and  oriented x3.  Gait is normal.  She is able to toe-walk and heel-walk.  NECK:  Range of motion is 75% range forward flexion, extension, lateral  rotation, and bending.  Tenderness in paraspinals  NEUROLOGY:  Her strength is 5/5 bilateral deltoids, biceps, triceps,  grips as well as hip flexor, knee extension, and  dorsiflexion.  She has  mildly diminished sensation to pinprick left deltoid area.  She has  negative Spurling's maneuver bilaterally.  Deep tendon reflexes are 2+  bilateral biceps, triceps, brachial radialis, knee and ankle.  BACK:  Lumbar spine range of motion full flex/ext, Lat bending, no  evidence of scoliosis, no tenderness to palpation  Her upper and lower extremity range of motion is normal at the  shoulders, elbows, wrists, hands, hips, knees, and ankles.  She does  have a dressing over her left great toe.  She has good pedal pulses  bilaterally.  Shoulder impingement signs are negative.   IMPRESSION:  1. Cervical post laminectomy syndrome.  Her pain is likely      multifactorial including diskogenic, cervical facet mediated, and      probable myofascial.  Given overall goal of reducing or eliminating      narcotic analgesic medications, we will first schedule for cervical      medical branch blocks, proceed to radiofrequency if only temporary      relief.  2. Initiate Ultram ER 200 mg per day.  3. Initiate Nortriptyline 10 q.h.s. for sleep as well as some of her      neuropathic component of pain in the left shoulder area.  4. After injections, consider PT referral for a TENS unit and a trial      of traction.   Thank you for this interesting consultation, I will keep you apprised of  her progress.  I have encouraged her to get back to work.  She states  that this is her goal as well.      [name] Horn, M.D.  Electronically Signed     AEK/MedQ  D:  01/28/2008 15:37:57  T:  01/29/2008 00:13:50  Job #:  [id number]   cc:   [name] Horn, M.D.  Fax: [phone]   Willow Ora, MD  [phone] [place], [place] [address]   G. Dorene Grebe, M.D.  Fax: [phone]   Excell Seltzer. Annabell Howells, M.D.  Fax: [phone]

## 2011-05-10 NOTE — Op Note (Signed)
NAME:  [name] Horn, [name] Horn               ACCOUNT NO.:  [id number]   MEDICAL RECORD NO.:  [id number]          PATIENT TYPE:  OIB   LOCATION:  5011                         FACILITY:  [facility]   PHYSICIAN:  [name], M.D.   DATE OF BIRTH:  04/21/1976   DATE OF PROCEDURE:  07/27/2007  DATE OF DISCHARGE:                               OPERATIVE REPORT   PREOPERATIVE DIAGNOSES:  1. Herniated nucleus pulposus, left C6-7, with left C7 nerve root      encroachment.  2. Herniated nucleus pulposus, left C7-T1.   POSTOPERATIVE DIAGNOSES:  1. 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  2. Left C7-T1 herniated nucleus pulposus, far lateral.   PROCEDURE:  Left C6-7 and left the C7-T1 [place] foraminotomies with  decompression the left C7 and C8 nerve roots.   SURGEON:  [name], M.D.   ASSISTANT:  Maud Deed, PA-C   ANESTHESIA:  General via orotracheal intubation, Dr. Krista Blue,  supplemented with local infiltration with Marcaine 0.5% with 1:200,000  epinephrine 10 mL.   DRAINS:  None.   BRIEF CLINICAL HISTORY:  The patient is a 35 year old right-handed  female.  She has been experiencing neck pain with radiation to the left  arm for and nearly 7 weeks.  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  She was seen  and evaluated by Dr. Farris Has as well as neurosurgery.  On evaluation she  was found to have left intrinsic nerve weakness, left triceps weakness.  Her reflexes remained normal.  Her MRI scan demonstrated a central  protruded disk at C5-6 with no significant cord compression and a very  minimal degree of impingement on the ventral surface of the thecal sac,  the appearance of a chronic disk bulge.  She was also found to have left-  sided C6-7, C7-T1 foraminal spurs with foraminal entrapment involving  the  left C7 and C8 nerve roots.  Attempts at conservative management  were performed.  She underwent steroid Dosepaks multiple times,  developed upper respiratory tract infection which was finally able to be  treated with Zosyn as well as Avelox and Z-Pak, eventually room  recovering from this.  She persists with numbness and weakness in her  left upper extremity intrinsics.  She was brought to the operating room  to undergo left-sided C6-7 and left-sided C7-T1 [place] foraminotomies.   DESCRIPTION OF PROCEDURE:  After adequate general anesthesia the patient  was placed into a prone position, chest rolls were used, the head on the  well-padded [place] horseshoe.  The shoulders taped downwards.  TED  support stockings to prevent DVT.  Standard preoperative antibiotics  with Ancef.  Standard prep with DuraPrep solution over the posterior  neck, extending from the occiput downwards to the upper dorsal spine.  She was draped in the usual manner.  Iodine Vi-Drape was used.  The  expected area of incision was first marked with the surgical marking  pen,  infiltrated with Marcaine 0.5%, 1:200,000 epinephrine.  Incision  made through skin and subcutaneous layers directly down to the prominent  spinous process present here.  This was then continued upwards in the  midline, exposing the next spinous process upwards, then placing a towel  clip on the upper level and an Allis clamp on the less visible prominent  spinous process within the incision.  Intraoperative lateral radiograph  demonstrated the Allis clamp on the C6 spinous process and the upper  clamp on the C5 spinous process.  Incision then was carried distally  caudally an additional 1 inch and exposure obtained over the left side,  the spinous process of C6, C7 and T1.  Electrocautery used to incise the  paracervical muscles off the lateral aspect of the left side the C6, C7  and T1 spinous processes and Cobb used to carefully elevate these   muscles.  These were carried out laterally to the patient's facets at C6-  7 and C7-T1.  Bleeders controlled using bipolar electrocautery.  Boss-  McCullough retractor was inserted in the incision for exposure of the  posterior aspect of the cervical spine, this portion of the procedure  performed using loupe magnification and headlights.  A high-speed bur  was then used to remove a small portion of the medial aspect of the  facet and inferior articular process of C6 and the inferior portion of  the lamina laterally.  A 3.8 mm diamond bur was used.  Additionally, the  superior portion of the lateral portion of the lamina of C7 and the  medial aspect, superior articular process, were carefully burred.  Additionally, this was done at the C7-T1 level as well, again using a  high-speed bur and removing the medial portion of the inferior articular  process of C7 and lateral aspect of the lamina, inferior aspect of C7.  The operating room microscope was then carefully sterilely draped,  brought into the field.  Under the microscope, then a 1 mm Kerrison was  used to carefully release the attachment of the ligamentum flavum from  the superior portion of the lamina of C7 and then the medial portion of  the superior articular process of C7 was carefully resected over about  10-15%.  This was carried superiorly.  The ligamentum flavum was  completely released from the superior portion of the lamina of C7 and a  micro titanium nerve hook was then used to lift the epidural veins away  from the thecal sac laterally and bipolar electrocautery used to divide  the epidural veins out laterally, releasing the fibrovascular leash and  over the posterior and medial aspect of the C7 nerve root.  This  released then the interval between the pedicle was C7, and the axillary  portion of the C7 nerve root was able to be identified.  There was fat  present.  A 3.0 straight microcurette was then used to remove a  small  portion of the superior medial border of the pedicle of C7, allowing for  easier access to the axillary area.  A nerve hook was then able to be  introduced within the axillary area of the nerve and then used to lift  the C7 nerve root superiorly and the uncovertebral joint was able to be  assessed, as was the disk on the left side of C6-7.  The disk itself did  not appear to be herniated.  There did appear to be prominence of the  uncovertebral joint with narrowing of the  neural foramen secondary to  this.  The neural foramen was able to be probed using a nerve hook and  demonstrated no further compression on the left C7 nerve root.  Thrombin-  soaked Gelfoam was applied into the axillary area along with bipolar  electrocautery of the epidural veins here to control bleeding.  Bone wax  applied to the bleeding cancellous bone surfaces.  Attention then turned  to the C7-T1 level, where similarly a bur had been used to thin the  medial aspect of the C7-T1 facet inferior articular process of C7 over  about 15%.  A 1-mm Kerrison used to resect this along with a portion of  the lateral aspect of the C7 inferior lamina.  The ligamentum flavum was  able to be resected off the superior surface of the T1 lamina and the  superior portion of the lamina of T1 also resected laterally using 1 and  2-mm Kerrisons.  The medial aspect of the superior articular process of  T1 was able to be partially facetectomized, again using 1 and 2-mm  Kerrisons.  The medial border of the superior portion of the T1 pedicle  identified, and this was partially removed using a 3.0 micro curette.  Approximately 10-15% of the C7-T1 facet was resected medially expose the  C8 nerve root within its neural foramen.  A micro titanium nerve hook  was used to elevate the epidural vein off of the thecal sac and then  bipolar cautery used to cauterize and divide the epidural veins,  releasing the C8 nerve root.  The  fibrovascular sheath holding the nerve  root ventral was also released.  The interval between the axillary  portion of the nerve root and the superior medial corner of the pedicle  was then easily identified, easily approached with a nerve hook.  Nerve  hook placed beneath the C8 nerve root used to elevate it superiorly.  The undersurface of the nerve root disk at the C7-T1 level was then  easily probed.  No particular disk herniation was noted to be posterior.  Primarily the disk appeared to be bulging within the neural foramen and  the fibrovascular sheath overlying this protrusion was carefully  released, decreasing any tendency for the nerve to be compressed against  this protrusion.  The disk itself was felt to be too far lateral to be  approached through the foraminotomy; however, foraminotomy alone  appeared to be adequate to decompress the nerve.  This allowed the nerve  root to float posterior to the disk protrusion.  This completed then, a  nerve hook could be carefully passed out the neural foramen,  demonstrating its patency and no nerve root compression here.  Bleeders  controlled using bipolar electrocautery and thrombin-soaked Gelfoam but  no active bleeding was present and following copious amounts of  irrigation, the portions of thrombin-soaked Gelfoam were removed  posteriorly.  There was no active bleeding present.  Soft tissues were  allowed to fall back into place.  The patient's ligamentum nuchae was  reapproximated in the midline with interrupted 0 Ethibond sutures, as  were the deep subcu layers.  More superficial layers approximated with  interrupted 0Vicryl sutures, then 2-0 Vicryl sutures, the skin closed  with a running subcu stitch of 4-0 Vicryl.  The patient then had  Dermabond applied.  Four by fours were fixed to the skin with Tegaderm.  The patient was then reactivated following return to a supine position,  extubated, and returned to the recovery room  in  satisfactory condition.  All instrument and sponge counts were correct.      [name], M.D.  Electronically Signed     JEN/MEDQ  D:  07/27/2007  T:  07/28/2007  Job:  [id number]

## 2011-05-10 NOTE — Assessment & Plan Note (Signed)
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  She has tried Lyrica, Tramadol and Vicodin.  She eventually underwent  foraminotomy left C6-7, left C7-T1 July 27, 2007, postoperatively on oxycodone and OxyContin.  When she saw me in  initial consultation, February 05, 2008, scheduled for cervical facet  injections but had a toe infection and was taking antibiotics, and  therefore, this had to be rescheduled.   She has restarted working in that interval time.  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  Does not do any lifting.  Passes out  medications mainly.   Her pain is still at a 7 out of 10 level basically at the neck and upper  shoulder and medial scapular area.  Interferes with activity at a  moderate level.  Sleep is poor.  Pain improves with rest and medications  however.  She does drive.  She does work.  She is independent with all  her self care.  Needs some assistance with certain household duties and  shopping.   MEDICATIONS:  1. Tramadol 3 tablets per day.  2. Norco 10/325 three to four tablets per day.  She does not think the tramadol is helping much.   REVIEW OF SYSTEMS:  Positive for numbness, tingling, spasms, night  sweats, painful urination.   Her blood pressure is 127/77, pulse 73, respirations 18, oxygen  saturation 100% on room air.  GENERAL:  No acute distress.  NECK:  Has 75% range of forward flexion, extension, lateral rotation,  bending.  She has normal sensation in the upper extremities.  Normal  deep tendon reflexes in the upper extremities, 5/5 strength in the  deltoid, biceps, triceps grip.  She has tenderness to palpation at the  medial scapular border on the left side greater than the right side.  She has negative Sperling's maneuver.   IMPRESSION:  1. Cervical postlaminectomy syndrome.  2. Cervical facet syndrome.  3. Probable cervical trapezius myofascial pain syndrome.   PLAN:  1.  Will get her going in some physical therapy right after her      cervical facet injections that are scheduled on April 7.  Will ask      her to trial cervical traction as well as TENS as well as cervical      thoracic stabilization.  2. Will wean off the Ultram.  3. Continue ibuprofen 800 t.i.d. but needs to stop 5 days prior to      injection.  4. Norco 10/325 t.i.d. or q.i.d. 100 for 1 month.  No signs of      aberrant drug behavior.      Erick Colace, M.D.  Electronically Signed     AEK/MedQ  D:  03/13/2008 10:42:53  T:  03/13/2008 12:33:44  Job #:  [id number]   cc:   [name], M.D.  Fax: [phone]   Willow Ora, MD  [phone] [place], [place] [address]

## 2011-05-10 NOTE — Consult Note (Signed)
[name] Horn, [name] Horn              ACCOUNT NO.:  [id number]   MEDICAL RECORD NO.:  [id number]          PATIENT TYPE:  OUT   LOCATION:  GYN                          FACILITY:  [facility]   PHYSICIAN:  John T. Kyla Balzarine, M.D.    DATE OF BIRTH:  1976/03/16   DATE OF CONSULTATION:  05/27/2009  DATE OF DISCHARGE:                                 CONSULTATION   CHIEF COMPLAINT:  Follow-up of stromal sarcoma.   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  All  treatment delivered in [place].  She relocated to [place] area.  Last examination was in March 2008 and was negative.  In  October/November 2009, she was explored for a cholecystectomy and had  extensive adhesiolysis with ventral hernia repair.  This has given her  problems with discomfort and occasional crampy pain.  She continues to  have issues with her frequent UTIs and interstitial cystitis, currently  having an exacerbation of urinary frequency.  She has occasional  diarrhea and continues to have dyspareunia and occasional postcoital  spotting because of vaginal atrophy.   CURRENT MEDICATIONS:  Nortrel daily, peridium and Duricef.   REVIEW OF SYSTEMS:  It should be noted that she had a few subcentimeter  pulmonary nodules seen on a chest CT scan that was performed in [facility] in November.  Recommendation was for 23-month follow-up.   PHYSICAL EXAMINATION:  Weight 169 pounds.  There was no supraclavicular or inguinal adenopathy.  No back or CVA  tenderness.  The abdomen was soft and benign with well-healed midline incision.  There is a diastasis in the umbilical region, without distinct hernia.  This is slightly tender to palpation.  No ascites or mass palpable.  EXTREMITIES:  Full strength and range of motion without edema, cords or  Homan's.  PELVIC:  External  genitalia, BUS, bladder and urethra normal.  The  vagina is extremely atrophic and somewhat agglutinated at the apex with  synechia broken down with speculum.  Radiation changes evident.  Bimanual and rectovaginal examinations reveal no mass or nodularity.   LABORATORY DATA:  Urine was obtained because symptoms and sent for C and  S and urinalysis.   ASSESSMENT:  Stromal sarcoma of the uterus, no evidence of disease  clinically.  Small pulmonary lesions on CT of the chest.  Estrogen  deficiency.   PLAN:  The patient was given refills for Nortrel.  Also prescription for  vaginal Premarin and a prescription for Macrobid in the event she has  positive urinary culture.  We can see her back for follow-up in 6 months  and recommend CT of the chest be obtained within the near future.      John T. Kyla Balzarine, M.D.  Electronically Signed     JTS/MEDQ  D:  05/27/2009  T:  05/27/2009  Job:  [id number]   cc:   Excell Seltzer. Annabell Howells, M.D.  Fax: [phone]   Telford Nab, R.N.  [address]  [place], [place] [address]  Darius Bump, M.D.  Fax: [phone]

## 2011-05-10 NOTE — Procedures (Signed)
NAME:  [name] Horn, [name] Horn              ACCOUNT NO.:  [id number]   MEDICAL RECORD NO.:  [id number]           PATIENT TYPE:   LOCATION:                                 FACILITY:   PHYSICIAN:  Celene Kras, MD        DATE OF BIRTH:  [birth year]   DATE OF PROCEDURE:  04/01/2008  DATE OF DISCHARGE:                               OPERATIVE REPORT   [name]. I  evaluated her __________  history of 14 point review of systems.   1. No evidence of infection.  We are going to go on to cervical facet      medial branch intervention, most problematic to the right side,      demonstrating spondylosis, minimal myelopathy, right side 4, 5, 6      and 7.  Predicate further intervention based on need.  We will      block four levels to affect block at 5, 6 and 7,  most problematic      levels, with contributory innervation addressed.  2. Other modifiable features and health profile reviewed.  Home based      therapy.  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  She needs to be as      functional as possible.  We want to minimize escalation of      controlled substances which is a goal for [place], but she is using      sparingly and appropriately, I will go ahead and renew.  3. Cautions as to medications working.  4. Consider Flector patch or topicals.  5. Consider second intervention as a sequential block, with the      possibility going to RF with positive predictive experience.   Objectively diffuse paracervical myofascial, positive cervical fetal  compression test particularly right greater than left with suprascapular  and levator scapular amplification side bending.  Nothing new  neurologically.   IMPRESSION:  Spondylosis, cervical.   PLAN:  Cervical facet medial branch intervention 4, 5, 6 and 7 with  contributory innervation addressed under local anesthetic and she has  consented.   Predicate further intervention based on need and overall  response.  Questions were answered, discussed in lay terms.  Follow-up  in 1 month.   The patient was taken to the fluoroscopy suite and placed in the supine  position. The neck prepped and draped in the usual fashion.  Using a 25  gauge needle I advanced the cervical facet medial branch at 4, 5, 6 and  7 under a local anesthetic, right side and she tolerated well.  Did  inject 0.5 mL lidocaine 1% MPF at each level, independent needle access  points, with a total of 40 mg of Aristocort in divided dose.   Tolerated the procedure well.  No complications from our procedure.  Appropriate discharge instructions given.  Assess within context of  activities of daily living.  ______________________________  Celene Kras, MD     HH/MEDQ  D:  04/01/2008 09:07:11  T:  04/01/2008 09:21:00  Job:  [id number]

## 2011-05-10 NOTE — Procedures (Signed)
[name], [name]              ACCOUNT NO.:  [id number]   MEDICAL RECORD NO.:  [id number]          PATIENT TYPE:  REC   LOCATION:  TPC                          FACILITY:  [facility]   PHYSICIAN:  Celene Kras, MD        DATE OF BIRTH:  1976/09/14   DATE OF PROCEDURE:  04/29/2008  DATE OF DISCHARGE:                               OPERATIVE REPORT   [name].  I  evaluated her and reviewed health and history, form 14-point review of  systems.   1. [name] has benefited from cervical facet medial branch      intervention, and this will be a sequential intervention to assess      efficacy, and contemplate RF.  I am going to stick with lidocaine.      I do not want to go on to bupivacaine today, as she did have a good      result probably more so on the steroid and local anesthetic.  She      had a lag phase, and then a good result with the referred pain,      suprascapular, levator scapular to the right side, and I am going      to go ahead and reblock her.  We planned C4, C5, C6 and C7,      independent needle access points under local anesthetic.  2. 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  3. Review of medication and other rationale to perform intervention to      minimize escalation of controlled substances.  Will follow up with      her in three weeks to see if she is a suitable RF candidate.  Will      have her see Dr. Wynn Banker, our biomechanical and rehab consultant.   OBJECTIVE:  Diffuse paracervical myofascial discomfort with positive  cervical fetal compression test, almost at baseline, right greater than  left suprascapular, levator scapular pain.  Nothing new neurologically.   IMPRESSION:  Spondylosis, degenerative spinal disease cervical spine,  minimal myelopathy.   PLAN:  Cervical facet medial branch intervention C4, 5, 6, 7.  Independent needle access points under local anesthetic.  She is  consented for today's  procedure, questions were answered and discussed  in lay terms.   The patient taken to fluoroscopy suite and placed in the supine  position.  After prep and drape in the usual fashion, using a 25-gauge  needle, I advanced to the cervical facet medial branch under direct  fluoroscopic observation at C4, C5, C6 and C7 with contributory  innervation addressed.  Confirmed placement.  I then inject 0.5 mL  lidocaine 1% MPF at each level with total of 40 mg Aristocort in divided  dose.   Tolerated procedure well.  No complications from our procedure.  Appropriate recovery.  Discharge instructions given.  Assess within  context of activities of daily living.  No barrier to communication.           ______________________________  Celene Kras, MD     HH/MEDQ  D:  04/29/2008 09:19:51  T:  04/29/2008 09:29:08  Job:  [id number]

## 2011-05-10 NOTE — Assessment & Plan Note (Signed)
1. [name] Horn comes in for pain management today, evaluated her health      and history form, 14-point review of systems.  [name] Horn was      originally scheduled for an intervention today, but I am going to      have hold off.  I took a look at her toe.  We actually have to      assist her in taking off her bandage, and I added saline and a      little lidocaine, as it is crusting onto the bandage.  She has      evidence of an infection and she has some bleeding there.  I want      her to go see her physician that is dealing with her toe, and I      think it is pretty clearly infected.  She has had a systemic      extension in the past, and she will need to be on antibiotics.      Will hold off intervention until this gets cleared up, and I      reviewed that with her.  I will follow up with her in a month.  I      have also reviewed her imaging progress to date, overall directive      care approach.  I have used models and discussed in lay terms.  I      do think she will probably benefit from cervical facet      intervention.  RF could be an option here.  2. Modifiable features and health profile discussed.  She is around a      husband who smokes around her, and the second-hand smoke is clearly      going to aggravate her.  She is an individual who has had cervical      spine surgery, foraminectomy, followed by infection with closure by      secondary intention, and we will follow her expectantly.  I think      that she has less myelopathy, more of a spondylosis, and this would      define more of a cervical facet dysfunction that I think medial      branch will help [place] address.  RF could be an option.  3. Other modifiable features and health profile discussed.  Home based      therapy.  Weight control for best outcome, maintaining contact with      primary care.  4. Full review of risk, complications, and options.  Will go ahead and      also give her some hydrocodone to be used  sparingly.  She is      familiar with this medicine.  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  Reviewed with her, questions are      answered, discussed in lay terms with no barrier to communication.   Objectively, she has diffuse paracervical myofascials, suprascapular  levator, scapular pain, positive cervical facetal  compression test,  right, left, suboccipital compression test positive.  She has a soft, nontender abdomen.  Cardiac exam is fine.  Pulmonary exam is clear to auscultation and percussion.  She has some myofascial pain in the para lumbar region.  Side bending  positive.  In  the posterior cervical region she has incision that looks  like it closed by secondary intention.  Clean and dry.  Her left great  toe is infected as described.  No nail is present.  Apparently this was  removed.   IMPRESSION:  _________ cervical spine with facet syndrome, spondylosis  without myelopathy.  Secondary problem is infected toe.   PLAN:  As outlined above.  We will see her back in about 1 month.  I  will give her some hydrocodone with cautions given.  Questions are  answered, discussed in lay terms.           ______________________________  Celene Kras, MD     HH/MedQ  D:  02/05/2008 10:33:46  T:  02/06/2008 10:37:10  Job #:  [id number]

## 2011-05-12 ENCOUNTER — Encounter: Payer: Self-pay | Admitting: Family Medicine

## 2011-05-13 NOTE — Consult Note (Signed)
NAME:  [name] Horn, [name] Horn               ACCOUNT NO.:  [id number]   MEDICAL RECORD NO.:  [id number]          PATIENT TYPE:  OUT   LOCATION:  GYN                          FACILITY:  [facility]   PHYSICIAN:  Paola A. Duard Brady, MD    DATE OF BIRTH:  [birth year]   DATE OF CONSULTATION:  02/28/2007  DATE OF DISCHARGE:                                 CONSULTATION   HISTORY:  The patient is a very pleasant 35 year old who underwent  exploratory laparotomy, TAH-BSO lymph node dissection, and appendectomy  in November of 2004 for a low-grade endometrial stromal sarcoma.  She  had stage IC disease based on significant extensive venous invasion of  tumor.  She underwent postoperative pelvic radiation in [place].  All of her surgery and radiation therapy was delivered and consulted  about with other GYN oncologist; and we have been seeing her for routine  follow up as she lives in the [place] area.  I most recently saw her  in September of 2007 at which time her exam and Pap smear were  unremarkable.  She had significant atrophic postradiation changes in the  vagina, but no evidence of recurrent disease.  She comes in today for  followup.   She is overall doing fairly well.  She did have right knee surgery in  January 2008.  She still out of work.  Her surgery was arthroscopic; and  she is still undergoing physical therapy.  She does require some more  pain medication; and her orthopedist is out of the office at a meeting;  and there is no one available to call it in, so she is asking that we  write that for her.  She is complaining of persistent increasing fatigue  which has increased steadily since October of 2007.  We have checked a  TSH for her in the past; and she is asking that we check that, again,  for her today.  She has had a couple of episodes of urinary tract  infections with cystitis.  She has been taking Pyridium and Urisep.  She  continues to suffer from dyspareunia and postcoital  bleeding which is  not new for her.  She is attempting to find a new primary physician and  may be going to see Dr. Blossom Hoops who is the same physician that her  husband sees.   PHYSICAL EXAMINATION:  VITAL SIGNS:  Weight 181 pounds which is up 17  pounds since her last visit in September.  Blood pressure 122/88.  GENERAL:  A well-nourished, well-developed female in no acute distress.  NECK:  Supple.  There is no lymphadenopathy, no thyromegaly.  LUNGS:  Clear to auscultation bilaterally.  CARDIOVASCULAR EXAM:  Regular rate and rhythm.  ABDOMEN:  Shows A well-healed vertical skin incision.  She has got  tattoos and an umbilical ring.  Abdomen is soft, nontender,  nondistended.  There are no palpable masses or hepatosplenomegaly.  GROINS:  Negative for adenopathy.  EXTREMITIES:  She has a well-healed surgical incisions overlying her  right knee.  PELVIC:  External genitalia is within normal limits.  The vagina is  markedly atrophic with significant foreshortening, and more  agglutination of the upper vagina than she has had in the past.  There  are postradiation changes.  There is no evidence of recurrent disease.  A ThinPrep Pap was submitted without difficulty.  Bimanual examination,  again, confirms the increased agglutination and foreshortening that she  has had.  The vaginal mucosa is smooth.  RECTOVAGINAL EXAMINATION:  Confirms.   ASSESSMENT:  8. A 35 year old with history of stage IC low-grade endometrial      stromal sarcoma who has no evidence of recurrent disease and is 3      years out; almost 3-1/2 years from her surgery.  Plan to follow      results from Pap smear, will check a TSH.  2. I wrote her for hydrocodone APAP 5/500, #40 take p.r.n..  3. She has had some increasing foreshortening of the vagina.  She is      sexually active, but secondary to dyspareunia has limited her      sexual activities.  We had a discussion regarding the need to      continue with sexual  activity and even discussed a dilator.  She      was given a dilator, today, to see if that would potentially      improve the agglutination of the vagina which may lead to decreased      dyspareunia.  She will return to see me in 6 months or p.r.n.      Paola A. Duard Brady, MD  Electronically Signed     PAG/MEDQ  D:  02/28/2007  T:  02/28/2007  Job:  [id number]   cc:   Hazle Coca  Fax: [phone]   Excell Seltzer. Annabell Howells, M.D.  Fax: [phone]   Telford Nab, R.N.  [address]  [place], [place] [address]

## 2011-05-13 NOTE — Consult Note (Signed)
NAME:  [name] Horn, [name] Horn               ACCOUNT NO.:  [id number]   MEDICAL RECORD NO.:  [id number]          PATIENT TYPE:  OUT   LOCATION:  GYN                          FACILITY:  [facility]   PHYSICIAN:  Paola A. Duard Brady, MD    DATE OF BIRTH:  09/01/1976   DATE OF CONSULTATION:  04/11/2006  DATE OF DISCHARGE:                                   CONSULTATION   ONCOLOGY CONSULTATION   HISTORY OF PRESENT ILLNESS:  [name] Horn is a very pleasant 35 year old who  underwent exploratory laparotomy, total abdominal hysterectomy, bilateral  salpingo-oophorectomy, lymph node dissection and appendectomy in November  2004 for low grade endometrial stromal sarcoma.  She had no evidence of  extrauterine metastatic disease, however, she had stage 1C disease based on  extensive venous invasion of her tumor.  She underwent postoperative pelvic  radiotherapy in [place].  I most recently saw her in December of 2006.  Her examination was essentially unremarkable at that time. Her Pap smear did  show atypical squamous cells with slight squamous atypia.  This most likely  is secondary to radiation changes but she comes in today for follow up.  She  has overall been doing fairly well.  She states since we last saw her in  December she has been feeling somewhat poorly in that she is feeling tired.  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  She is sleeping well.  She is eating well.  She did have a  gastrointestinal bug and for those days she was not eating well and had some  diarrhea, nausea and vomiting but that has resolved. She denies any  significant change in bowel habits or bladder habits. She does complain of  some occasional postcoital spotting and some mild dyspareunia which is no  different than it has been since her radiation.  She is complaining of a  rash on her lower back. She had it  several years ago and it resolved  spontaneously and appears to have returned.  She otherwise denies  complaints.   PHYSICAL EXAMINATION:  VITAL SIGNS:  Weight 162 pounds.  Well-nourished,  well-developed female in no acute distress.  NECK:  Supple.  There is no lymphadenopathy.  No thyromegaly.  LUNGS:  Clear to auscultation bilaterally.  CARDIOVASCULAR:  Regular rate and rhythm.  ABDOMEN:  She has a well-healed vertical skin incision.  She has tattoos.  Abdomen soft, nontender, nondistended.  There are no palpable masses or  hepatosplenomegaly.  GENITOURINARY:  Groin negative for adenopathy.  EXTREMITIES:  There is no edema.  PELVIC:  External genitalia is within normal limits.  The vagina is  atrophic.  The vaginal cuff is visualized.  It is atrophic in appearance,  status post radiation.  No gross visible lesions.  There is no abnormal  discharge.  Thin prep Pap was submitted without difficulty.  Bimanual  examination reveals no masses or nodularity.  Rectal examination confirms.   ASSESSMENT:  35 year old with stage 1C low grade  endometrial stroma sarcoma  who has no evidence of recurrent disease.   PLAN:  1.  Will follow up on results from Pap smear from today.  2.  She will return to see me in six months if her Pap smear is      unremarkable.  3.  She elects to have staging in the summertime. She will contact us in      June and we will get her set up for a chest x-ray as well as CT scan of      the abdomen and pelvis.      Paola A. Duard Brady, MD  Electronically Signed     PAG/MEDQ  D:  04/11/2006  T:  04/12/2006  Job:  [id number]   cc:   Hazle Coca  Fax: [phone]   Excell Seltzer. Annabell Howells, M.D.  Fax: [phone]   Telford Nab, R.N.  [address]  [place], [place] [address]

## 2011-05-13 NOTE — Consult Note (Signed)
NAME:  Snavely, Mattisen               ACCOUNT NO.:  [id number]   MEDICAL RECORD NO.:  [id number]          PATIENT TYPE:  OUT   LOCATION:  GYN                          FACILITY:  [facility]   PHYSICIAN:  Paola A. Duard Brady, MD    DATE OF BIRTH:  [birth year]   DATE OF CONSULTATION:  06/14/2005  DATE OF DISCHARGE:                                   CONSULTATION   REASON FOR CONSULTATION:  [name] Horn is a very pleasant 35 year old who  underwent exploratory laparotomy, TAH/BSO, lymph node dissection, and  appendectomy November 2004 for a low-grade endometrial stromal sarcoma with  no evidence of metastatic disease. She had stage Ic disease. By operative  note, she had extensive venous invasion of tumor just beneath the serosa in  the myometrium. She had postoperative radiation therapy and was seen by Dr.  Durward Fortes in February 2006. We last saw her in March 2006. She recently returned  to this area and needed to be reestablished. At the time we saw her, exam  was unremarkable. She had been doing well with the exception of having some  fatigue and weight loss. She had a normal TSH by report. She comes in today.  She is overall doing fairly well. She has infrequent, intermittent abdominal  discomfort. Bowels are without change. She has not noticed any particular  bowel irregularity associated with particular dietary choices. She has not  been working quite as hard at losing weight and has not been as strict with  her diet or exercise program. She occasionally has some hot flashes,  primarily at night. They have not particularly changed at all. She did have  a CT scan of the abdomen and pelvis as well as a chest x-ray earlier this  month. Chest x-ray was negative. CT scan of the abdomen and pelvis revealed  no evidence of metastatic disease. The osseous structures were unremarkable.   PHYSICAL EXAMINATION:  VITAL SIGNS:  Weight 145, blood pressure 100/68.  GENERAL:  Well-nourished, well-developed female in no  acute distress.  NECK:  Supple. There is no lymphadenopathy, no thyromegaly.  LUNGS:  Clear to auscultation bilaterally.  CARDIOVASCULAR:  Regular rate and rhythm.  ABDOMEN:  She has a well-healed skin incision. Abdomen is soft, nontender,  nondistended. There is no palpable mass or hepatosplenomegaly. Groins are  negative for adenopathy.  EXTREMITIES:  There is no edema.  PELVIC:  External genitalia is within normal limits. The vagina is markedly  atrophic. The vaginal cuff is visualized. There are no visible lesions.  ThinPrep Pap was submitted without difficulty. Bimanual examination reveals  no masses or nodularities. There is some tenderness at the right side of the  vaginal cuff. Rectal confirms.   ASSESSMENT:  A 35 year old with low-grade endometrial stromal sarcoma with  no evidence of recurrent disease.   PLAN:  1.  She will continue her Ortho-Novum 7/7/7 for hot flash management. She      has not used any Premarin cream for      assistance with dyspareunia; that has not been as much of an issue      recently.  2.  She will return to see me in 3 months for follow-up of the results of      her Pap smear. If she should continue to have as much fatigue and should      her weight loss become unintentional, workup will be necessary for that.       PAG/MEDQ  D:  06/14/2005  T:  06/14/2005  Job:  [id number]   cc:   Telford Nab, R.N.  [address]  [place], [place] [address]

## 2011-05-13 NOTE — Consult Note (Signed)
NAME:  [name] Horn, [name] Horn               ACCOUNT NO.:  [id number]   MEDICAL RECORD NO.:  [id number]          PATIENT TYPE:  OUT   LOCATION:  GYN                          FACILITY:  [facility]   PHYSICIAN:  Paola A. Duard Brady, MD    DATE OF BIRTH:  [birth year]   DATE OF CONSULTATION:  03/01/2005  DATE OF DISCHARGE:                                   CONSULTATION   CONSULTING PHYSICIAN:  Dr. Hazle Coca, [facility], GYN/Oncology.   [name] Horn is a very pleasant 35 year old gravida 1, para 0, aborta 1, who  underwent exploratory laparotomy, TAH/BSO, lymph node dissection, and  appendectomy, November 12, 2003, for a low-grade endometrial stroma sarcoma  with no evidence of metastatic disease.  She had stage IC disease.  I do not  have estrogen or progesterone receptor status.  She did have extensive  venous invasion of tumor to just beneath the serosa and the myometrium.  There was no sarcoma seen outside of the uterus.  She had postoperative  radiation therapy and was last seen by Dr. Noland Fordyce, January 27, 2005.  At that  time, her exam was unremarkable.  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  It is more convenient for her to be seen by [name] Horn.  She  has had no evidence of recurrent disease and when Dr. Noland Fordyce saw her last  month, she had a normal Pap smear.  She had radiographic imaging in July  2005 with a CT scan of the abdomen and pelvis which was negative.  She  states she also had a chest x-ray in June 2005 that was unremarkable.   REVIEW OF SYSTEMS:  She has lost some weight and continues to do so.  She  has lost an additional 5 pounds but overall has lost about 20 pounds in the  past year.  She has modified her diet and is exercising more.  She had her  TSH checked which, by her report, was normal.  She has had a cystitis and  recurrent UTIs, and that has been exacerbated since her pelvic and vaginal  cuff brachytherapy.  She is  on Pyridium and __________ on a regular basis.  She does have intermittent constipation and diarrhea, and there have been no  changes.  This all started when she had her myomectomy which diagnosed her  endometrial stromal sarcoma.  It does not appear that her diet significantly  changes her bowel or bladder habits.   PAST MEDICAL HISTORY:  None.   PAST SURGICAL HISTORY:  1.  Myomectomy.  2.  TAH/BSO.  3.  Staging, as above.  4.  Bilateral knee arthroscopic surgery.  5.  Cystoscopy x 2.  6.  Tonsillectomy.   FAMILY HISTORY:  Significant for hypertension.  No diabetes.  Maternal great  aunt had breast cancer and ovarian cancer at a fairly young age.  She is  the only child, so she does not have siblings with any medical exams.   SOCIAL HISTORY:  Denies any tobacco.  She drinks alcohol socially.  She is  married.  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  Her husband is in office sales.   PHYSICAL EXAMINATION:  VITAL SIGNS:  Weight 153 pounds.  Blood pressure  128/80, pulse 60, respirations 16.  GENERAL:  Well-nourished, well-developed female in no acute distress.  At  admission, fully-healed skin incision.  ABDOMEN:  Soft, nontender, nondistended.  No palpable masses or  hepatosplenomegaly.  Groins are negative for adenopathy.  EXTREMITIES:  There is no edema.  BIMANUAL EXAMINATION:  No masses or nodularity.  RECTAL:  Deferred as she recently had an exam February 2 of the year 2006.   ASSESSMENT:  A 35 year old with low-grade endometrial stromal sarcoma with  no evidence of recurrent disease.   PLAN:  1.  She will return to see me in 3 months' time.  At that time, we will      perform a full examination and also stage her radiographically with a CT      scan of the abdomen and pelvis and chest x-ray.  2.  She is having some difficulties with dyspareunia and bleeding      postintercourse.  We will start her on Premarin vaginal cream 1 g twice       weekly. She will continue on her Ortho-Novum 7/7/7.  If she continues to      have some vasomotor symptoms at night, we may consider adding Effexor      XL.  I would be hesitant in increasing her estrogen dose secondary to      her family history of breast cancer.      PAG/MEDQ  D:  03/01/2005  T:  03/01/2005  Job:  [id number]

## 2011-05-13 NOTE — Consult Note (Signed)
NAME:  [name] Horn, [name] Horn               ACCOUNT NO.:  [id number]   MEDICAL RECORD NO.:  [id number]          PATIENT TYPE:  OUT   LOCATION:  GYN                          [facility]ACILITY:  [facility]   PHYSICIAN:  Paola A. Duard Brady, MD    DATE OF BIRTH:  1976-07-22   DATE OF CONSULTATION:  12/06/2005  DATE OF DISCHARGE:                                   CONSULTATION   [name] Horn is a very pleasant 35 year old who underwent exploratory  laparotomy, TAH/BSO, lymph node dissection, appendectomy in November 2004  for a low grade endometrial stromal sarcoma. She had no evidence of  metastatic disease. However, she had stage IC disease and extensive venous  invasion of tumor. She subsequently underwent postoperative pelvic  radiotherapy and this was all done in [facility]. I most recently saw her  in September 2006 at which time her exam and Pap smear were unremarkable.  She comes in today for followup. She is overall doing quite well and denies  any significant complaints with the exception of some slight increased  bleeding after intercourse. She has no bleeding other than with intercourse  and it lasts for about a day or two. Her dyspareunia persists, it is no  worse. Her bladder symptoms have increased as she is no longer on her  Urised. She has followed up with Dr. Annabell Howells for this. She is currently only  on Pyridium and if she could have resolution of some of her bladder  symptoms, she states that she would feel better. Clearly, she had cystitis  as associated by her radiation therapy. She was followed up by her  orthopedic surgeon and she has had resolution of her pain. She otherwise  denies any significant change in her bowel or bladder function. Her bladder  dryness is improved. She denies any hot flashes.   PHYSICAL EXAMINATION:  VITAL SIGNS:  Weight 149 pounds which is up 2 pounds  from her last visit. Blood pressure 122/72.  NECK:  Supple, there is no lymphadenopathy, no thyromegaly.  LUNGS:   Clear to auscultation bilaterally.  CARDIOVASCULAR:  Regular rate and rhythm.  ABDOMEN:  Shows a well healed surgical incision. She has an umbilical  piercing and multiple tattoos. Abdomen is soft, diffusely tender in the  bilateral lower quadrant. There is no rebound or guarding. Abdomen is soft,  there is no palpable mass.  GROINS:  Negative for adenopathy.  EXTREMITIES:  No edema.  PELVIC:  External genitalia is within normal limits. The vagina is atrophic.  The vaginal cuff is visualized, there are no visible lesions. A ThinPrep Pap  was submitted without difficulty. Bimanual examination reveals no masses or  nodularity. Rectal confirms.   ASSESSMENT:  A 35 year old with stage IC low grade endometrial stromal  sarcoma with no evidence of recurrent disease who is now 2 years out.   PLAN:  1.  Will follow results for Pap smear from today. She will return to see [place]      in 4 months.  2.  She will continue her Ortho-Novum 777.      Paola A. Duard Brady, MD  Electronically Signed     PAG/MEDQ  D:  12/06/2005  T:  12/07/2005  Job:  [id number]   cc:   Hazle Coca  Fax: [phone]   Excell Seltzer. Annabell Howells, M.D.  Fax: [phone]   Telford Nab, R.N.  [address]  [place], [place] [address]

## 2011-05-13 NOTE — Consult Note (Signed)
NAME:  [name] Horn, [name] Horn               ACCOUNT NO.:  [id number]   MEDICAL RECORD NO.:  [id number]          PATIENT TYPE:  OUT   LOCATION:  GYN                          FACILITY:  [facility]   PHYSICIAN:  Paola A. Duard Brady, MD    DATE OF BIRTH:  [birth year]   DATE OF CONSULTATION:  09/13/2006  DATE OF DISCHARGE:  09/13/2006                                   CONSULTATION   [name] Horn is a very pleasant 6, soon to be 35 year old who underwent  exploratory laparotomy, TAH-BSO, lymph node dissection and appendectomy in  November 2004 for a low-grade endometrial stromal sarcoma.  She had stage I-  C disease and based on significant extensive venous invasion of her tumor,  she underwent postoperative pelvic radiotherapy in [place].  All of  her surgical and radiation therapy was delivered by other GYN oncologists.  We have been seeing her here in consultation.  She was most recently seen by  me in April 2007, at which time her exam was notable for an atrophic vagina  but otherwise no evidence of metastatic disease.  Her Pap smear showed  atypical squamous cells of undetermined significance.  She did undergo a CT  scan of the abdomen and pelvis on May 31.  It revealed no evidence of  metastatic disease.  She is overall doing fairly well.  She seems to be  feeling a little bit better than she normally does.  She did, however,  injure her right knee as she hyperextended it playing kick ball and is  wearing a knee brace.  She is undergoing physical therapy.  She continues to  have some hot flashes, which are fairly well-controlled with her birth  control pills.  She has been using her vaginal Premarin cream on a fairly  regular basis, approximately two times per week.  She has not noticed any  significant improvement with regard to her postcoital spotting and mild  dyspareunia.   PHYSICAL EXAMINATION:  VITAL SIGNS:  Weight 164 pounds, blood pressure  110/70.  GENERAL:  Well-nourished, well-developed  female in no acute distress.  NECK:  Supple.  There is no lymphadenopathy, no thyromegaly.  LUNGS:  Clear to auscultation bilaterally.  CARDIOVASCULAR:  Regular rate and rhythm.  ABDOMEN:  Abdomen shows a well-healed vertical skin incision with tattoos  and an umbilical ring.  Abdomen is soft, nontender, nondistended.  There are  no palpable masses or hepatosplenomegaly.  Groins are negative for  adenopathy.  EXTREMITIES:  No edema.  PELVIC:  External genitalia is within normal limits.  the vagina is  atrophic.  The vaginal cuff is visualized.  It is atrophic status post  radiation changes with petechial changes.  There are no gross visible  lesions.  ThinPrep Pap was submitted without difficulty.  Bimanual  examination reveals no masses or nodularity.  Rectal confirms.   ASSESSMENT:  A 35 year old with stage I-C low-grade endometrial stromal  sarcoma, who has no evidence of disease and is almost 3 years out.   PLAN:  1. Will follow up the results of her Pap smear from today.  She will  return to see [place] in 6 months.  2. She will continue her birth control pills and her vaginal Premarin      cream.      Paola A. Duard Brady, MD  Electronically Signed     PAG/MEDQ  D:  09/13/2006  T:  09/15/2006  Job:  [id number]   cc:   Hazle Coca  Fax: [phone]   Excell Seltzer. Annabell Howells, M.D.  Fax: [phone]   Telford Nab, R.N.  [address]  [place], [place] [address]

## 2011-05-13 NOTE — Op Note (Signed)
NAME:  Horn, [name]               ACCOUNT NO.:  [id number]   MEDICAL RECORD NO.:  [id number]          PATIENT TYPE:  AMB   LOCATION:  [facility]                         FACILITY:  [facility]   PHYSICIAN:  Almedia Balls. Ranell Patrick, M.D. DATE OF BIRTH:  1976/07/19   DATE OF PROCEDURE:  01/11/2007  DATE OF DISCHARGE:                               OPERATIVE REPORT   PREOPERATIVE DIAGNOSIS:  Right knee pain of unknown etiology, suspect  occult chondral defect.   POSTOPERATIVE DIAGNOSIS:  Right knee pain of unknown etiology, suspect  occult chondral defect as well as medial plica syndrome and loose  bodies.   PROCEDURE PERFORMED:  Right knee arthroscopy with removal of loose body  as well as plical shelf excision.   ATTENDING PHYSICIAN:  Almedia Balls. Ranell Patrick, M.D.   ASSISTANT:  None.   ANESTHESIA:  General.   ESTIMATED BLOOD LOSS:  Minimal.   FLUIDS REPLACED:  Crystalloid 1200 cc.   INSTRUMENT COUNT:  Correct.   COMPLICATIONS:  None.   PERIOPERATIVE ANTIBIOTICS:  Given.   INDICATIONS:  Patient is a 35 year old female with a history of  worsening right medial knee pain.  Patient has had mechanical symptoms  and signs, including large effusions.  The patient presents now with  persistent functional deficits in the knee and pain, desiring operative  treatment, to identify the source of her pain.  Informed consent was  obtained.   DESCRIPTION OF PROCEDURE:  After an adequate level of anesthesia was  achieved, the patient was positioned supine on the operating room table.  The right leg was correctly identified and placed in an arthroscopic leg  holder.  The left leg was placed in a well leg holder.  After sterile  prep and drape to the right knee, we entered the knee arthroscopically,  using standard arthroscopic portals.  Superolateral outflow,  anterolateral scope, and anterior medial working portals were  established.  We identified a very thin fissure line and some scuffing  in the trochlear  notch, dead center, with no wear at all on the patella.  There was a medial plica that did seem to touch the medial femoral  condyle.  It had not done any abrasion to that, and it was not  significantly red; however, due to the fact that it was contacting the  medial femoral condyle, and she had these symptoms, we then performed a  plical excision.  Medial gutter free of loose body.  Lateral gutter did  have a chondral loose body, which we identified and removed.  This was a  1-2 mm loose body and was rounded off like it had been there a while.  ACL was intact.  Lateral compartment completely normal with the  exception of some fissuring, grade 2 and grade 3 changes on the tibial  surface.  The femoral surface seemed to be in good condition.  The  medial compartment was entered.  There was noted to be no sign of  meniscal tear, as there was none in the lateral compartment, but there  were very significant areas of softness where the cartilage was probed  and felt  to be very soft and the bone underlying it potentially as well.  This was mostly on the tibial side but also on the femoral side over  areas measuring 2 x 2 cm.  Following removal of loose body and the  removal of the plical shelf and the inspection of the entire knee,  included scoping the knee through the notch and the back of the knee and  finding no other pathology, we went ahead and completed arthroscopy,  suturing wounds with 4-0 Monocryl, followed by injection with local and  compressive bandage.  The patient tolerated the surgery well and was  taken to the recovery room.      Almedia Balls. Ranell Patrick, M.D.  Electronically Signed     SRN/MEDQ  D:  01/11/2007  T:  01/11/2007  Job:  [id number]

## 2011-05-13 NOTE — Consult Note (Signed)
NAME:  [name] Horn, [name] Horn               ACCOUNT NO.:  [id number]   MEDICAL RECORD NO.:  [id number]          PATIENT TYPE:  OUT   LOCATION:  GYN                          FACILITY:  [facility]   PHYSICIAN:  Paola A. Duard Brady, MD    DATE OF BIRTH:  Apr 03, 1976   DATE OF CONSULTATION:  09/06/2005  DATE OF DISCHARGE:                                   CONSULTATION   [name] Horn is a very pleasant 35 year old, who underwent exploratory  laparotomy, TAH/BSO, lymph node dissection, appendectomy in November 2004  for low grade endometrial stroma sarcoma and who had no evidence of  metastatic disease.  She had stage IC disease and because of extensive  venous invasion of tumor just beneath the serosa, she underwent  postoperative radiation.  We most recently saw her in June 2006 at which  time her exam and Pap smear were unremarkable.  At that time, she had been  having some fatigue and overall weight loss with a negative TSH, and we were  concerned about her persistent weight loss.  She underwent a CT scan of the  abdomen and pelvis in June 2006 which was unremarkable.  She comes in today  for follow up.  She has overall been doing quite well.  She had a viral  infection over the summer with high fevers, headaches, sore throat.  She  missed several days of work but has resolved from that.  She has also  recently pulled her neck trying to open a garage door.  She had a similar  issue in the past.  She was seen by orthopedic surgeon and prescribed  Robaxin and Vicodin which after approximately 2-3 weeks resolved her  symptoms.  She states she has been trying to contact his office but has not  gotten through today.  She otherwise denies any change in her bowel or  bladder habits.  She continues to have vaginal dryness and occasional hot  flashes.  They are no worse or changed.  In fact, the vaginal dryness may be  somewhat improved.  She denies any vaginal bleeding.   PHYSICAL EXAMINATION:  VITAL SIGNS:  Weight  147 pounds which is up 2 pounds  from her last visit, blood pressure 130/78.  GENERAL:  Well-nourished, well-developed female in no acute distress.  NECK:  Supple.  There is no lymphadenopathy, no thyromegaly.  LUNGS:  Clear to auscultation bilaterally.  CARDIOVASCULAR:  Regular rate and rhythm.  ABDOMEN:  She has a well-healed surgical skin incision.  Abdomen is soft,  nontender, nondistended.  There are no palpable masses or  hepatosplenomegaly.  Groins are negative for adenopathy.  EXTREMITIES:  There is no edema.  BIMANUAL EXAMINATION:  No masses or nodularity.  Rectal confirms.   ASSESSMENT:  A 35 year old with stage IC low grade endometrial stroma  sarcoma with no evidence of recurrent disease.   PLAN:  1.  She will return to see me in 3 months.  That will be about 2 years out,      and we can start spacing her appointments out.  2.  She will continue her  Ortho Novum 7-7-7.  3.  I wrote her for Robaxin and Vicodin __________her symptoms but have      encouraged her to continue contacting orthopedic surgeon.      Paola A. Duard Brady, MD  Electronically Signed     PAG/MEDQ  D:  09/06/2005  T:  09/06/2005  Job:  [id number]   cc:   Hazle Coca  [facility] [place]  [place] [address]  Fax: [phone]   Telford Nab, R.N.  [address]  [place], [place] [address]

## 2011-09-14 LAB — URINALYSIS, ROUTINE W REFLEX MICROSCOPIC
Bilirubin Urine: NEGATIVE
Glucose, UA: NEGATIVE
Ketones, ur: 15 — AB
Protein, ur: NEGATIVE
pH: 6.5

## 2011-09-14 LAB — URINE CULTURE
Colony Count: NO GROWTH
Culture: NO GROWTH

## 2011-09-14 LAB — URINE MICROSCOPIC-ADD ON

## 2011-10-10 LAB — BASIC METABOLIC PANEL
BUN: 13
CO2: 28
Calcium: 9.4
Chloride: 102
Creatinine, Ser: 0.9
Glucose, Bld: 87

## 2011-10-10 LAB — CBC
MCHC: 34
MCV: 101.6 — ABNORMAL HIGH
RBC: 3.93
RDW: 13.9

## 2011-10-10 LAB — TYPE AND SCREEN
ABO/RH(D): O NEG
Antibody Screen: NEGATIVE

## 2012-02-07 ENCOUNTER — Other Ambulatory Visit (HOSPITAL_COMMUNITY): Payer: Self-pay | Admitting: *Deleted

## 2012-02-07 DIAGNOSIS — Z1231 Encounter for screening mammogram for malignant neoplasm of breast: Secondary | ICD-10-CM

## 2012-03-06 ENCOUNTER — Ambulatory Visit (HOSPITAL_COMMUNITY): Payer: Self-pay

## 2012-03-29 ENCOUNTER — Ambulatory Visit (HOSPITAL_COMMUNITY): Payer: Self-pay | Attending: Family Medicine

## 2012-10-27 ENCOUNTER — Encounter (HOSPITAL_BASED_OUTPATIENT_CLINIC_OR_DEPARTMENT_OTHER): Payer: Self-pay | Admitting: *Deleted

## 2012-10-27 ENCOUNTER — Emergency Department (HOSPITAL_BASED_OUTPATIENT_CLINIC_OR_DEPARTMENT_OTHER)
Admission: EM | Admit: 2012-10-27 | Discharge: 2012-10-27 | Disposition: A | Discharge status: Home / Self Care | Payer: Self-pay | Attending: Emergency Medicine | Admitting: Emergency Medicine

## 2012-10-27 DIAGNOSIS — R21 Rash and other nonspecific skin eruption: Secondary | ICD-10-CM | POA: Insufficient documentation

## 2012-10-27 DIAGNOSIS — R509 Fever, unspecified: Secondary | ICD-10-CM | POA: Insufficient documentation

## 2012-10-27 DIAGNOSIS — B349 Viral infection, unspecified: Secondary | ICD-10-CM

## 2012-10-27 DIAGNOSIS — B9789 Other viral agents as the cause of diseases classified elsewhere: Secondary | ICD-10-CM | POA: Insufficient documentation

## 2012-10-27 DIAGNOSIS — Z888 Allergy status to other drugs, medicaments and biological substances status: Secondary | ICD-10-CM | POA: Insufficient documentation

## 2012-10-27 DIAGNOSIS — Z8542 Personal history of malignant neoplasm of other parts of uterus: Secondary | ICD-10-CM | POA: Insufficient documentation

## 2012-10-27 DIAGNOSIS — J3489 Other specified disorders of nose and nasal sinuses: Secondary | ICD-10-CM | POA: Insufficient documentation

## 2012-10-27 DIAGNOSIS — Z79899 Other long term (current) drug therapy: Secondary | ICD-10-CM | POA: Insufficient documentation

## 2012-10-27 MED ORDER — PROPARACAINE HCL 0.5 % OP SOLN
1.0000 [drp] | Freq: Once | OPHTHALMIC | Status: AC
  Administered 2012-10-27: 1 [drp] via OPHTHALMIC
  Filled 2012-10-27: qty 15

## 2012-10-27 MED ORDER — VALACYCLOVIR HCL 1 G PO TABS: 1000.0000 mg | ORAL_TABLET | Freq: Three times a day (TID) | ORAL | Status: AC

## 2012-10-27 MED ORDER — FLUORESCEIN SODIUM 1 MG OP STRP
1.0000 | ORAL_STRIP | Freq: Once | OPHTHALMIC | Status: AC
  Administered 2012-10-27: 1 via OPHTHALMIC
  Filled 2012-10-27: qty 1

## 2012-10-27 NOTE — ED Notes (Signed)
Pt presents to ED today for rash and flu like sx for the last 5 days.  Pt has hx of shingles and says it feels the same

## 2012-10-27 NOTE — ED Notes (Signed)
Unable to obtain lab. Assigned RN notified and at bedside for evaluation

## 2012-10-27 NOTE — ED Provider Notes (Signed)
History  This chart was scribed for Jones Skene, MD by Shari Heritage and Marlin Canary. The patient was seen in room MH11/MH11. Patient's care was started at 1817.     CSN: [id number]  Arrival date & time 10/27/12  1729   First MD Initiated Contact with Patient 10/27/12 1817      Chief Complaint  Patient presents with  . Herpes Zoster     The history is provided by the patient. No language interpreter was used.    [name] Horn is a 36 y.o. female who presents to the Emergency Department complaining of erythematous, stinging, worsening blisters to her face, posterior neck, chest and left arm onset 5 days ago. She is also positive for fever, chills, and rhinorrhea. Maximum fever at home was 100.1. Patient denies SOB, chest pain, double vision, blurry vision, nausea, vomiting, diarrhea or abdominal pain. No new joint or bony pain. She has taken Tylenol and Advil for pain relief. Patient has had shingles in the past and states that current symptoms are similar, but more severe. She says she often gets fever blisters that originate in the nares. Patient denies history of HIV. She has a history of uterine cancer and was treated in 2004 with radiation and had a total abdominal hysterectomy. She also has a history of DVT, but is not taking Coumadin at this time. Other medical history includes bilateral knee surgery, clavicle fracture, appendectomy and cholecystectomy. Patient takes OTC Prilosec and Adderall daily. Patient has seasonal allergies.   Oncologist - Dr. Ian Malkin ([facility])  Past Medical History  Diagnosis Date  . Uterine cancer   . DVT (deep venous thrombosis)     x2    Past Surgical History  Procedure Date  . Total abdominal hysterectomy     Sarcoma, s/p XRT  . Minimally invasive foraminotomy cervical spine     C6-T1, Nitka  . Appendectomy   . Cholecystectomy   . Tonsillectomy   . Knee surgery     x3    Family History  Problem Relation Age of Onset  .  Hypertension    . Thyroid disease    . Hyperlipidemia    . Colon cancer      grandmother    History  Substance Use Topics  . Smoking status: Never Smoker   . Smokeless tobacco: Not on file  . Alcohol Use: Yes    OB History    Grav Para Term Preterm Abortions TAB SAB Ect Mult Living                  Review of Systems At least 10pt or greater review of systems completed and are negative except where specified in the HPI.  Allergies  Benadryl and Sulfonamide derivatives  Home Medications   Current Outpatient Rx  Name Route Sig Dispense Refill  . ALPRAZOLAM 0.5 MG PO TABS Oral Take 0.5 mg by mouth every 4 (four) hours as needed.      . BUSPIRONE HCL 15 MG PO TABS Oral Take 15 mg by mouth 2 (two) times daily.      Marland Kitchen HYDROCODONE-ACETAMINOPHEN 5-500 MG PO TABS Oral Take 1 tablet by mouth every 6 (six) hours as needed.      . WARFARIN SODIUM 5 MG PO TABS Oral Take 5 mg by mouth as directed.      . ALBUTEROL SULFATE HFA 108 (90 BASE) MCG/ACT IN AERS Inhalation Inhale 2 puffs into the lungs every 4 (four) hours as needed.      Marland Kitchen  AMPHETAMINE-DEXTROAMPHETAMINE 20 MG PO TABS Oral Take 20 mg by mouth 3 (three) times daily.    Marland Kitchen OMEPRAZOLE 20 MG PO CPDR Oral Take 20 mg by mouth daily.        BP 128/84  Pulse 94  Temp 97.9 F (36.6 C)  Resp 20  SpO2 100%  Physical Exam  Nursing notes reviewed.  Electronic medical record reviewed. VITAL SIGNS:   Filed Vitals:   10/27/12 1756  BP: 128/84  Pulse: 94  Temp: 97.9 F (36.6 C)  Resp: 20  SpO2: 100%   CONSTITUTIONAL: Awake, oriented, appears non-toxic HENT: Atraumatic, normocephalic, oral mucosa pink and moist, airway patent. Nares patent without drainage. External ears normal. EYES: Conjunctiva clear, EOMI, PERRLA NECK: Trachea midline, non-tender, supple CARDIOVASCULAR: Normal heart rate, Normal rhythm, No murmurs, rubs, gallops PULMONARY/CHEST: Clear to auscultation, no rhonchi, wheezes, or rales. Symmetrical breath sounds.  Non-tender. ABDOMINAL: Non-distended, soft, non-tender - no rebound or guarding.  BS normal. NEUROLOGIC: Non-focal, moving all four extremities, no gross sensory or motor deficits. EXTREMITIES: No clubbing, cyanosis, or edema SKIN: Warm, Dry, No erythema, No rash  ED Course  Procedures (including critical care time) COORDINATION OF CARE: 6:30pm- Patient informed of current plan for treatment and evaluation and agrees with plan at this time.      Labs Reviewed  HIV ANTIBODY (ROUTINE TESTING)   No results found.   1. Rash   2. Viral illness       MDM  [name] Horn is a 36 y.o. female presents with lesions suggestive of zoster - however they are not in a dermatomal pattern; this is suggestive of either disseminated zoster or more likely viral exanthem likely secondary to enterovirus.  Pt has had a viral prodrome with fevers and myalgias.  Pt had a lesion in her nose, currently not there. Eyes didn't show any corneal involvement. Will begin tx with valacyclovir, and have her f/u with PCP.  Will screen for HIV.  I explained the diagnosis and have given explicit precautions to return to the ER including any other new or worsening symptoms. The patient understands and accepts the medical plan as it's been dictated and I have answered their questions. Discharge instructions concerning home care and prescriptions have been given.  The patient is STABLE and is discharged to home in good condition.    I personally performed the services described in this documentation, which was scribed in my presence. The recorded information has been reviewed and considered. Jones Skene, M.D.      Jones Skene, MD 10/29/12 1346

## 2012-10-28 LAB — HIV ANTIBODY (ROUTINE TESTING W REFLEX): HIV: NONREACTIVE

## 2013-02-09 ENCOUNTER — Other Ambulatory Visit: Payer: Self-pay

## 2013-04-16 ENCOUNTER — Ambulatory Visit: Payer: BC Managed Care – PPO | Admitting: Diagnostic Neuroimaging

## 2013-04-26 ENCOUNTER — Other Ambulatory Visit: Payer: Self-pay | Admitting: *Deleted

## 2013-04-26 ENCOUNTER — Ambulatory Visit: Payer: Self-pay | Admitting: Diagnostic Neuroimaging

## 2013-05-09 ENCOUNTER — Ambulatory Visit: Payer: Self-pay | Admitting: Diagnostic Neuroimaging

## 2013-10-31 ENCOUNTER — Other Ambulatory Visit: Payer: Self-pay

## 2013-12-24 ENCOUNTER — Emergency Department (HOSPITAL_BASED_OUTPATIENT_CLINIC_OR_DEPARTMENT_OTHER)
Admission: EM | Admit: 2013-12-24 | Discharge: 2013-12-25 | Disposition: A | Discharge status: Home / Self Care | Payer: BC Managed Care – PPO | Attending: Emergency Medicine | Admitting: Emergency Medicine

## 2013-12-24 ENCOUNTER — Encounter (HOSPITAL_BASED_OUTPATIENT_CLINIC_OR_DEPARTMENT_OTHER): Payer: Self-pay | Admitting: Emergency Medicine

## 2013-12-24 DIAGNOSIS — Y92410 Unspecified street and highway as the place of occurrence of the external cause: Secondary | ICD-10-CM | POA: Insufficient documentation

## 2013-12-24 DIAGNOSIS — Z7901 Long term (current) use of anticoagulants: Secondary | ICD-10-CM | POA: Insufficient documentation

## 2013-12-24 DIAGNOSIS — Z79899 Other long term (current) drug therapy: Secondary | ICD-10-CM | POA: Insufficient documentation

## 2013-12-24 DIAGNOSIS — Z8544 Personal history of malignant neoplasm of other female genital organs: Secondary | ICD-10-CM | POA: Insufficient documentation

## 2013-12-24 DIAGNOSIS — S161XXA Strain of muscle, fascia and tendon at neck level, initial encounter: Secondary | ICD-10-CM

## 2013-12-24 DIAGNOSIS — Z86718 Personal history of other venous thrombosis and embolism: Secondary | ICD-10-CM | POA: Insufficient documentation

## 2013-12-24 DIAGNOSIS — Y9389 Activity, other specified: Secondary | ICD-10-CM | POA: Insufficient documentation

## 2013-12-24 DIAGNOSIS — S139XXA Sprain of joints and ligaments of unspecified parts of neck, initial encounter: Secondary | ICD-10-CM | POA: Insufficient documentation
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
## 2013-12-25 ENCOUNTER — Emergency Department (HOSPITAL_BASED_OUTPATIENT_CLINIC_OR_DEPARTMENT_OTHER): Payer: BC Managed Care – PPO

## 2013-12-25 MED ORDER — HYDROCODONE-ACETAMINOPHEN 5-325 MG PO TABS: 1.0000 | ORAL_TABLET | Freq: Four times a day (QID) | ORAL | Status: DC | PRN

## 2013-12-25 MED ORDER — HYDROCODONE-ACETAMINOPHEN 5-325 MG PO TABS
1.0000 | ORAL_TABLET | Freq: Once | ORAL | Status: AC
  Administered 2013-12-25: 1 via ORAL
  Filled 2013-12-25: qty 1

## 2013-12-25 MED ORDER — ONDANSETRON 4 MG PO TBDP
4.0000 mg | ORAL_TABLET | Freq: Once | ORAL | Status: AC
  Administered 2013-12-25: 4 mg via ORAL
  Filled 2013-12-25: qty 1

## 2013-12-25 MED ORDER — CYCLOBENZAPRINE HCL 10 MG PO TABS: 10.0000 mg | ORAL_TABLET | Freq: Three times a day (TID) | ORAL | Status: DC | PRN

## 2013-12-25 MED ORDER — CYCLOBENZAPRINE HCL 10 MG PO TABS
5.0000 mg | ORAL_TABLET | Freq: Once | ORAL | Status: AC
  Administered 2013-12-25: 5 mg via ORAL
  Filled 2013-12-25: qty 1
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
## 2014-12-09 DIAGNOSIS — M5412 Radiculopathy, cervical region: Secondary | ICD-10-CM | POA: Insufficient documentation

## 2015-01-21 DIAGNOSIS — B9689 Other specified bacterial agents as the cause of diseases classified elsewhere: Secondary | ICD-10-CM | POA: Insufficient documentation

## 2015-01-21 DIAGNOSIS — K869 Disease of pancreas, unspecified: Secondary | ICD-10-CM | POA: Insufficient documentation

## 2015-02-24 ENCOUNTER — Telehealth: Payer: Self-pay

## 2015-02-24 NOTE — Telephone Encounter (Signed)
Admission Date: 02/12/15 Discharge Date: 02/17/15  Reason for admission:  Sepsis secondary to C. Difficile colits  Left a message for call back.

## 2015-02-25 ENCOUNTER — Ambulatory Visit: Payer: BLUE CROSS/BLUE SHIELD | Admitting: Family Medicine

## 2015-02-27 NOTE — Telephone Encounter (Signed)
Appt was cancelled.  

## 2015-04-01 ENCOUNTER — Other Ambulatory Visit: Payer: Self-pay | Admitting: Internal Medicine

## 2015-04-01 ENCOUNTER — Ambulatory Visit
Admission: RE | Admit: 2015-04-01 | Discharge: 2015-04-01 | Disposition: A | Discharge status: Home / Self Care | Payer: BLUE CROSS/BLUE SHIELD | Source: Ambulatory Visit | Attending: Internal Medicine | Admitting: Internal Medicine

## 2015-04-01 DIAGNOSIS — Z1231 Encounter for screening mammogram for malignant neoplasm of breast: Secondary | ICD-10-CM

## 2015-04-15 ENCOUNTER — Other Ambulatory Visit: Payer: Self-pay | Admitting: Internal Medicine

## 2015-04-15 DIAGNOSIS — R5381 Other malaise: Secondary | ICD-10-CM

## 2015-10-14 DIAGNOSIS — Z8669 Personal history of other diseases of the nervous system and sense organs: Secondary | ICD-10-CM | POA: Insufficient documentation

## 2015-11-11 ENCOUNTER — Encounter (HOSPITAL_COMMUNITY): Payer: Self-pay | Admitting: Emergency Medicine

## 2015-11-11 ENCOUNTER — Emergency Department (HOSPITAL_COMMUNITY)
Admission: EM | Admit: 2015-11-11 | Discharge: 2015-11-11 | Disposition: A | Discharge status: Home / Self Care | Payer: BLUE CROSS/BLUE SHIELD | Attending: Emergency Medicine | Admitting: Emergency Medicine

## 2015-11-11 DIAGNOSIS — Z86718 Personal history of other venous thrombosis and embolism: Secondary | ICD-10-CM | POA: Insufficient documentation

## 2015-11-11 DIAGNOSIS — K029 Dental caries, unspecified: Secondary | ICD-10-CM | POA: Insufficient documentation

## 2015-11-11 DIAGNOSIS — Z8542 Personal history of malignant neoplasm of other parts of uterus: Secondary | ICD-10-CM | POA: Diagnosis not present

## 2015-11-11 DIAGNOSIS — K047 Periapical abscess without sinus: Secondary | ICD-10-CM | POA: Insufficient documentation

## 2015-11-11 DIAGNOSIS — Z79899 Other long term (current) drug therapy: Secondary | ICD-10-CM | POA: Insufficient documentation

## 2015-11-11 MED ORDER — METRONIDAZOLE 500 MG PO TABS
500.0000 mg | ORAL_TABLET | Freq: Once | ORAL | Status: AC
  Administered 2015-11-11: 500 mg via ORAL
  Filled 2015-11-11: qty 1

## 2015-11-11 MED ORDER — OXYCODONE-ACETAMINOPHEN 5-325 MG PO TABS: 1.0000 | ORAL_TABLET | Freq: Four times a day (QID) | ORAL | Status: DC | PRN

## 2015-11-11 MED ORDER — AMOXICILLIN 500 MG PO CAPS
500.0000 mg | ORAL_CAPSULE | Freq: Once | ORAL | Status: AC
  Administered 2015-11-11: 500 mg via ORAL
  Filled 2015-11-11: qty 1

## 2015-11-11 MED ORDER — OXYCODONE-ACETAMINOPHEN 5-325 MG PO TABS
1.0000 | ORAL_TABLET | Freq: Once | ORAL | Status: AC
  Administered 2015-11-11: 1 via ORAL
  Filled 2015-11-11: qty 1

## 2015-11-11 MED ORDER — BUPIVACAINE-EPINEPHRINE (PF) 0.5% -1:200000 IJ SOLN
1.8000 mL | Freq: Once | INTRAMUSCULAR | Status: AC
  Administered 2015-11-11: 1.8 mL
  Filled 2015-11-11: qty 1.8

## 2015-11-11 MED ORDER — AMOXICILLIN 500 MG PO CAPS: 500.0000 mg | ORAL_CAPSULE | Freq: Three times a day (TID) | ORAL | Status: DC

## 2015-11-11 MED ORDER — METRONIDAZOLE 500 MG PO TABS: 500.0000 mg | ORAL_TABLET | Freq: Two times a day (BID) | ORAL | Status: DC

## 2015-11-11 NOTE — Discharge Instructions (Signed)
Dental Abscess °A dental abscess is a collection of pus in or around a tooth. °CAUSES °This condition is caused by a bacterial infection around the root of the tooth that involves the inner part of the tooth (pulp). It may result from: °· Severe tooth decay. °· Trauma to the tooth that allows bacteria to enter into the pulp, such as a broken or chipped tooth. °· Severe gum disease around a tooth. °SYMPTOMS °Symptoms of this condition include: °· Severe pain in and around the infected tooth. °· Swelling and redness around the infected tooth, in the mouth, or in the face. °· Tenderness. °· Pus drainage. °· Bad breath. °· Bitter taste in the mouth. °· Difficulty swallowing. °· Difficulty opening the mouth. °· Nausea. °· Vomiting. °· Chills. °· Swollen neck glands. °· Fever. °DIAGNOSIS °This condition is diagnosed with examination of the infected tooth. During the exam, your dentist may tap on the infected tooth. Your dentist will also ask about your medical and dental history and may order X-rays. °TREATMENT °This condition is treated by eliminating the infection. This may be done with: °· Antibiotic medicine. °· A root canal. This may be performed to save the tooth. °· Pulling (extracting) the tooth. This may also involve draining the abscess. This is done if the tooth cannot be saved. °HOME CARE INSTRUCTIONS °· Take medicines only as directed by your dentist. °· If you were prescribed antibiotic medicine, finish all of it even if you start to feel better. °· Rinse your mouth (gargle) often with salt water to relieve pain or swelling. °· Do not drive or operate heavy machinery while taking pain medicine. °· Do not apply heat to the outside of your mouth. °· Keep all follow-up visits as directed by your dentist. This is important. °SEEK MEDICAL CARE IF: °· Your pain is worse and is not helped by medicine. °SEEK IMMEDIATE MEDICAL CARE IF: °· You have a fever or chills. °· Your symptoms suddenly get worse. °· You have a  very bad headache. °· You have problems breathing or swallowing. °· You have trouble opening your mouth. °· You have swelling in your neck or around your eye. °  °This information is not intended to replace advice given to you by your health care provider. Make sure you discuss any questions you have with your health care provider. °  °Document Released: 12/12/2005 Document Revised: 04/28/2015 Document Reviewed: 12/09/2014 °Elsevier Interactive Patient Education ©2016 [employer] ° °Dental Pain °Dental pain may be caused by many things, including: °· Tooth decay (cavities or caries). Cavities expose the nerve of your tooth to air and hot or cold temperatures. This can cause pain or discomfort. °· Abscess or infection. A dental abscess is a collection of infected pus from a bacterial infection in the inner part of the tooth (pulp). It usually occurs at the end of the tooth's root. °· Injury. °· An unknown reason (idiopathic). °Your pain may be mild or severe. It may only occur when: °· You are chewing. °· You are exposed to hot or cold temperature. °· You are eating or drinking sugary foods or beverages, such as soda or candy. °Your pain may also be constant. °HOME CARE INSTRUCTIONS °Watch your dental pain for any changes. The following actions may help to lessen any discomfort that you are feeling: °· Take medicines only as directed by your dentist. °· If you were prescribed an antibiotic medicine, finish all of it even if you start to feel better. °· Keep all   follow-up visits as directed by your dentist. This is important. °· Do not apply heat to the outside of your face. °· Rinse your mouth or gargle with salt water if directed by your dentist. This helps with pain and swelling. °¨ You can make salt water by adding ¼ tsp of salt to 1 cup of warm water. °· Apply ice to the painful area of your face: °¨ Put ice in a plastic bag. °¨ Place a towel between your skin and the bag. °¨ Leave the ice on for 20 minutes,  2-3 times per day. °· Avoid foods or drinks that cause you pain, such as: °¨ Very hot or very cold foods or drinks. °¨ Sweet or sugary foods or drinks. °SEEK MEDICAL CARE IF: °· Your pain is not controlled with medicines. °· Your symptoms are worse. °· You have new symptoms. °SEEK IMMEDIATE MEDICAL CARE IF: °· You are unable to open your mouth. °· You are having trouble breathing or swallowing. °· You have a fever. °· Your face, neck, or jaw is swollen. °  °This information is not intended to replace advice given to you by your health care provider. Make sure you discuss any questions you have with your health care provider. °  °Document Released: 12/12/2005 Document Revised: 04/28/2015 Document Reviewed: 12/08/2014 °Elsevier Interactive Patient Education ©2016 Elsevier Inc. ° °

## 2015-11-11 NOTE — ED Provider Notes (Signed)
CSN: [id number]     Arrival date & time 11/11/15  1813 History  By signing my name below, I, Randa Evens, attest that this documentation has been prepared under the direction and in the presence of Delsa Grana, PA-C. Electronically Signed: Randa Evens, ED Scribe. 11/14/2015. 4:49 AM.    Chief Complaint  Patient presents with  . Dental Pain  . Facial Pain   The history is provided by the patient. No language interpreter was used.   HPI Comments: [name] Horn is a 39 y.o. female who presents to the Emergency Department complaining of worsening left sided dental pain onset 2 days prior. Pt reports that she has a had a chipped tooth for 1 year and it has recently flared up. Pt states that she has associated facial swelling. Pt state that the pain is radiating to her her jaw. Pt reports intermittent fever max temp 100. Pt states that she has been taking ibuprofen with no relief. Pt states that she has also been taking hydrocodone with no relief. Pt states that her pain is worse when opening and closing her mouth. Pt states that she did try to see an oral surgeon but was turned away due to not having enough money to cover the visit.    Past Medical History  Diagnosis Date  . Uterine cancer ([place])   . DVT (deep venous thrombosis) ([place])     x2   Past Surgical History  Procedure Laterality Date  . Total abdominal hysterectomy      Sarcoma, s/p XRT  . Minimally invasive foraminotomy cervical spine      C6-T1, Nitka  . Appendectomy    . Cholecystectomy    . Tonsillectomy    . Knee surgery      x3   Family History  Problem Relation Age of Onset  . Hypertension    . Thyroid disease    . Hyperlipidemia    . Colon cancer      grandmother   Social History  Substance Use Topics  . Smoking status: Never Smoker   . Smokeless tobacco: None  . Alcohol Use: Yes   OB History    No data available      Review of Systems  Constitutional: Positive for fever.  HENT: Positive for  dental problem and facial swelling.      Allergies  Benadryl and Sulfonamide derivatives  Home Medications   Prior to Admission medications   Medication Sig Start Date End Date Taking? Authorizing Provider  albuterol (VENTOLIN HFA) 108 (90 BASE) MCG/ACT inhaler Inhale 2 puffs into the lungs every 4 (four) hours as needed.      Historical Provider, MD  ALPRAZolam Duanne Moron) 0.5 MG tablet Take 0.5 mg by mouth every 4 (four) hours as needed.      Historical Provider, MD  amoxicillin (AMOXIL) 500 MG capsule Take 1 capsule (500 mg total) by mouth 3 (three) times daily. 11/11/15   Delsa Grana, PA-C  amphetamine-dextroamphetamine (ADDERALL) 20 MG tablet Take 20 mg by mouth 3 (three) times daily.    Historical Provider, MD  busPIRone (BUSPAR) 15 MG tablet Take 15 mg by mouth 2 (two) times daily.      Historical Provider, MD  cyclobenzaprine (FLEXERIL) 10 MG tablet  03/24/13   Historical Provider, MD  cyclobenzaprine (FLEXERIL) 10 MG tablet Take 1 tablet (10 mg total) by mouth 3 (three) times daily as needed for muscle spasms. 12/25/13   Shanon Rosser, MD  HYDROcodone-acetaminophen (NORCO/VICODIN) 5-325 MG per tablet  Take 1-2 tablets by mouth every 6 (six) hours as needed for moderate pain. 12/25/13   John Molpus, MD  HYDROcodone-acetaminophen (VICODIN) 5-500 MG per tablet Take 1 tablet by mouth every 6 (six) hours as needed.      Historical Provider, MD  metroNIDAZOLE (FLAGYL) 500 MG tablet Take 1 tablet (500 mg total) by mouth 2 (two) times daily. 11/11/15   Delsa Grana, PA-C  omeprazole (PRILOSEC) 20 MG capsule Take 20 mg by mouth daily.      Historical Provider, MD  oxyCODONE-acetaminophen (PERCOCET) 5-325 MG tablet Take 1-2 tablets by mouth every 6 (six) hours as needed for severe pain. 11/11/15   Delsa Grana, PA-C  warfarin (COUMADIN) 5 MG tablet Take 5 mg by mouth as directed.      Historical Provider, MD   BP 115/81 mmHg  Pulse 66  Temp(Src) 97.8 F (36.6 C) (Oral)  Resp 18  SpO2 98%    Physical Exam  Constitutional: She is oriented to person, place, and time. She appears well-developed and well-nourished. No distress.  HENT:  Head: Normocephalic and atraumatic.    Right Ear: External ear normal.  Left Ear: External ear normal.  Nose: Nose normal.  Mouth/Throat: Uvula is midline, oropharynx is clear and moist and mucous membranes are normal. Mucous membranes are not pale, not dry and not cyanotic. No trismus in the jaw. Abnormal dentition. Dental caries present. No dental abscesses or uvula swelling. No oropharyngeal exudate, posterior oropharyngeal edema or posterior oropharyngeal erythema.    No sublingual induration or fluctuance, mild left sublingual ttp  Eyes: Conjunctivae and EOM are normal. Pupils are equal, round, and reactive to light. Right eye exhibits no discharge. Left eye exhibits no discharge. No scleral icterus.  Neck: Normal range of motion. Neck supple. No JVD present. No muscular tenderness present. No rigidity. No tracheal deviation, no edema and no erythema present. No Brudzinski's sign and no Kernig's sign noted.  Cardiovascular: Normal rate and regular rhythm.   Pulmonary/Chest: Effort normal and breath sounds normal. No stridor. No respiratory distress.  Musculoskeletal: Normal range of motion. She exhibits no edema.  Lymphadenopathy:       Head (right side): No submandibular adenopathy present.       Head (left side): No submandibular adenopathy present.    She has no cervical adenopathy.  Neurological: She is alert and oriented to person, place, and time. She exhibits normal muscle tone. Coordination normal.  Skin: Skin is warm and dry. No rash noted. She is not diaphoretic. No erythema. No pallor.  Psychiatric: She has a normal mood and affect. Her behavior is normal. Judgment and thought content normal.  Nursing note and vitals reviewed.   ED Course  Procedures (including critical care time) DIAGNOSTIC STUDIES: Oxygen Saturation is 100%  on RA, normal by my interpretation.    COORDINATION OF CARE: 6:48 PM-Discussed treatment plan with pt at bedside and pt agreed to plan.   NERVE BLOCK Date/Time: 11/11/15 Performed by: Dala Dock Authorized by: Dala Dock Consent: Verbal consent obtained. Risks and benefits: risks, benefits and alternatives were discussed Consent given by: patient Indications: pain relief Body area: tooth # 19 Laterality: Left lower jaw, first molar Needle gauge: 27 G Local anesthetic: bupivacaine  Anesthetic total: 1.8 ml Outcome: pain improved Patient tolerance: Patient tolerated the procedure well with no immediate complications. Comments: Patient had complete relief of pain.  INCISION AND DRAINAGE Performed by: Delsa Grana Consent: Verbal consent obtained. Risks and benefits: risks, benefits and alternatives were  discussed Time out performed prior to procedure Type: abscess Body area: dental abscess Anesthesia: local infiltration Incision was made with a scalpel. Local anesthetic: see note above Anesthetic total: see note  Complexity: simple Drainage: blood without purulence Drainage amount: minimal Packing material: n/a Patient tolerance: Patient tolerated the procedure well with no immediate complications.   Labs Review Labs Reviewed - No data to display  Imaging Review No results found.    EKG Interpretation None      MDM   Final diagnoses:  Periapical abscess    Patient with left lower jaw dental pain and swelling.  She is a poorly seen by a dentist and by oral surgeon today and was told she needed multiple drains placed however the oral surgeon would not treat her due to inability to pay. She was then instructed to report to the ER for treatment.  She does have swelling and induration to the left lower jaw, diffuse dental decay.  She does not have exam findings concerning for Ludwig angina. She has no sublingual tenderness to palpation, no induration or  erythema below her jawline or down her neck.  She is not having any difficulty clearing secretions, swallowing, breathing.  She does not appear septic, was afebrile while in the ER.  Dr. Tyrone Nine has seen and evaluated the patient and suggested to call on-call oral surgeon and attempt to drain indurated area. Patient was numbed and I&D was attempted without any successful purulent drainage.   The case was reviewed with the oncall Oral Surgeon, Dr. Benson Norway, who agreed to see the pt in the morning.    Patient was given pain medication, amoxicillin and Flagyl, with follow-up tomorrow morning with Dr. Benson Norway.    Delsa Grana, PA-C 11/14/15 [place], DO 11/16/15 1038

## 2015-11-11 NOTE — ED Notes (Signed)
Pt. Stated, [name] Horn had a broken tooth for a year and it flared up Monday evening, and its causing my face to hurt.  I went to an oral surgeon and would not do anything cause i didn't have 3000 dollars, told us to come here.

## 2015-11-11 NOTE — ED Provider Notes (Signed)
Medical screening examination/treatment/procedure(s) were conducted as a shared visit with non-physician practitioner(s) and myself.  I personally evaluated the patient during the encounter.   EKG Interpretation None       See the written copy of this report in the patient's paper medical record.  These results did not interface directly into the electronic medical record and are summarized here.  39 yo F with a chief complaints of left-sided dental pain and facial swelling. This been going on for a few days. Patient denies any fevers or chills having some worsening difficulty opening her mouth.  On exam no noted trismus. Patient with fluctuance adjacent to the left second molar. Site left second molar is also fractured and tender to palpation. Suspect odontogenic abscess.  Patient was seen by a dentist and then referred to an oral surgeon today. The oral surgeon felt like she needed incision and drainage tooth removal and possible external drain. When presented the cost patient stated that she is unable to pay for this and then he told them to come to the ED for further evaluation.  Will discuss case with oral surgery. D/c home.   Deno Etienne, DO 11/16/15 1038

## 2015-11-18 DIAGNOSIS — K432 Incisional hernia without obstruction or gangrene: Secondary | ICD-10-CM | POA: Insufficient documentation

## 2015-12-25 ENCOUNTER — Emergency Department (HOSPITAL_COMMUNITY)
Admission: EM | Admit: 2015-12-25 | Discharge: 2015-12-25 | Disposition: A | Discharge status: Home / Self Care | Payer: BLUE CROSS/BLUE SHIELD | Attending: Emergency Medicine | Admitting: Emergency Medicine

## 2015-12-25 ENCOUNTER — Encounter (HOSPITAL_COMMUNITY): Payer: Self-pay | Admitting: *Deleted

## 2015-12-25 DIAGNOSIS — Z86718 Personal history of other venous thrombosis and embolism: Secondary | ICD-10-CM | POA: Insufficient documentation

## 2015-12-25 DIAGNOSIS — K0381 Cracked tooth: Secondary | ICD-10-CM | POA: Diagnosis not present

## 2015-12-25 DIAGNOSIS — Z8542 Personal history of malignant neoplasm of other parts of uterus: Secondary | ICD-10-CM | POA: Diagnosis not present

## 2015-12-25 DIAGNOSIS — Z8619 Personal history of other infectious and parasitic diseases: Secondary | ICD-10-CM | POA: Insufficient documentation

## 2015-12-25 DIAGNOSIS — K047 Periapical abscess without sinus: Secondary | ICD-10-CM

## 2015-12-25 DIAGNOSIS — Z792 Long term (current) use of antibiotics: Secondary | ICD-10-CM | POA: Insufficient documentation

## 2015-12-25 DIAGNOSIS — Z79899 Other long term (current) drug therapy: Secondary | ICD-10-CM | POA: Diagnosis not present

## 2015-12-25 DIAGNOSIS — Z9889 Other specified postprocedural states: Secondary | ICD-10-CM | POA: Insufficient documentation

## 2015-12-25 DIAGNOSIS — K0889 Other specified disorders of teeth and supporting structures: Secondary | ICD-10-CM | POA: Diagnosis present

## 2015-12-25 MED ORDER — PENICILLIN V POTASSIUM 500 MG PO TABS: 500.0000 mg | ORAL_TABLET | Freq: Four times a day (QID) | ORAL | Status: AC

## 2015-12-25 MED ORDER — OXYCODONE-ACETAMINOPHEN 5-325 MG PO TABS: 1.0000 | ORAL_TABLET | ORAL | Status: DC | PRN

## 2015-12-25 MED ORDER — PENICILLIN V POTASSIUM 500 MG PO TABS: 500.0000 mg | ORAL_TABLET | Freq: Four times a day (QID) | ORAL | Status: DC

## 2015-12-25 MED ORDER — HYDROCODONE-ACETAMINOPHEN 5-325 MG PO TABS: 1.0000 | ORAL_TABLET | ORAL | Status: DC | PRN

## 2015-12-25 NOTE — ED Provider Notes (Signed)
CSN: [id number]     Arrival date & time 12/25/15  1948 History  By signing my name below, I, Stephania Fragmin, attest that this documentation has been prepared under the direction and in the presence of [facility], PA-C. Electronically Signed: Stephania Fragmin, ED Scribe. 12/25/2015. 8:53 PM.    Chief Complaint  Patient presents with  . Dental Problem   The history is provided by the patient. No language interpreter was used.    HPI Comments: [name] Horn is a 39 y.o. female with a history of uterine cancer, who presents to the Emergency Department complaining of gradual-onset, constant, severe, right upper dental pain radiating to her right cheek and right eye that began yesterday. She reports bleeding and purulent drainage and states she has had a low-grade fever (temperature of 99.5 at home). Exposure to air and touch exacerbate her pain. Patient had a history of the same on the left side several years ago and had an I&D done. 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 Patient tried to see Dr. Benson Norway oral surgeon for her symptoms, but she was advised to go through the ED for a referral before seeing him. She denies diarrhea. Patient has known allergies to Sulfa.  Denies fevers, sore throat, difficulty swallowing or breathing.    Past Medical History  Diagnosis Date  . Uterine cancer ([place])   . DVT (deep venous thrombosis) ([place])     x2   Past Surgical History  Procedure Laterality Date  . Total abdominal hysterectomy      Sarcoma, s/p XRT  . Minimally invasive foraminotomy cervical spine      C6-T1, Nitka  . Appendectomy    . Cholecystectomy    . Tonsillectomy    . Knee surgery      x3   Family History  Problem Relation Age of Onset  . Hypertension    . Thyroid disease    . Hyperlipidemia    . Colon cancer      grandmother   a Social History  Substance Use Topics  . Smoking status: Never Smoker   . Smokeless tobacco:  None  . Alcohol Use: Yes   OB History    No data available     Review of Systems  Constitutional: Negative for fever.  HENT: Positive for dental problem. Negative for sore throat and trouble swallowing.   Respiratory: Negative for shortness of breath.   Gastrointestinal: Negative for diarrhea.  Musculoskeletal: Negative for neck pain and neck stiffness.  Skin: Negative for color change.  Allergic/Immunologic: Negative for immunocompromised state.  Psychiatric/Behavioral: Negative for self-injury.   Allergies  Benadryl and Sulfonamide derivatives  Home Medications   Prior to Admission medications   Medication Sig Start Date End Date Taking? Authorizing Provider  albuterol (VENTOLIN HFA) 108 (90 BASE) MCG/ACT inhaler Inhale 2 puffs into the lungs every 4 (four) hours as needed.      Historical Provider, MD  ALPRAZolam Duanne Moron) 0.5 MG tablet Take 0.5 mg by mouth every 4 (four) hours as needed.      Historical Provider, MD  amoxicillin (AMOXIL) 500 MG capsule Take 1 capsule (500 mg total) by mouth 3 (three) times daily. 11/11/15   Delsa Grana, PA-C  amphetamine-dextroamphetamine (ADDERALL) 20 MG tablet Take 20 mg by mouth 3 (three) times daily.    Historical Provider, MD  busPIRone (BUSPAR) 15 MG tablet Take 15 mg by mouth 2 (two) times daily.  Historical Provider, MD  cyclobenzaprine (FLEXERIL) 10 MG tablet  03/24/13   Historical Provider, MD  cyclobenzaprine (FLEXERIL) 10 MG tablet Take 1 tablet (10 mg total) by mouth 3 (three) times daily as needed for muscle spasms. 12/25/13   John Molpus, MD  HYDROcodone-acetaminophen (NORCO/VICODIN) 5-325 MG per tablet Take 1-2 tablets by mouth every 6 (six) hours as needed for moderate pain. 12/25/13   John Molpus, MD  HYDROcodone-acetaminophen (VICODIN) 5-500 MG per tablet Take 1 tablet by mouth every 6 (six) hours as needed.      Historical Provider, MD  metroNIDAZOLE (FLAGYL) 500 MG tablet Take 1 tablet (500 mg total) by mouth 2 (two) times  daily. 11/11/15   Delsa Grana, PA-C  omeprazole (PRILOSEC) 20 MG capsule Take 20 mg by mouth daily.      Historical Provider, MD  oxyCODONE-acetaminophen (PERCOCET) 5-325 MG tablet Take 1-2 tablets by mouth every 6 (six) hours as needed for severe pain. 11/11/15   Delsa Grana, PA-C  warfarin (COUMADIN) 5 MG tablet Take 5 mg by mouth as directed.      Historical Provider, MD   BP 142/95 mmHg  Pulse 104  Temp(Src) 97.8 F (36.6 C) (Oral)  Resp 18  SpO2 100% Physical Exam  Constitutional: She appears well-developed and well-nourished. No distress.  HENT:  Head: Normocephalic and atraumatic.  Mouth/Throat: Uvula is midline and oropharynx is clear and moist. Mucous membranes are not dry. No uvula swelling. No oropharyngeal exudate, posterior oropharyngeal edema, posterior oropharyngeal erythema or tonsillar abscesses.  Right upper first molar with old filling, remote fracture, tender to percussion.  Adjacent facial tenderness without notable swelling.    Neck: Trachea normal, normal range of motion and phonation normal. Neck supple. No tracheal tenderness present. No rigidity. No tracheal deviation, no edema, no erythema and normal range of motion present.  Cardiovascular: Normal rate.   Pulmonary/Chest: Effort normal and breath sounds normal. No stridor.  Lymphadenopathy:    She has no cervical adenopathy.  Neurological: She is alert.  Skin: She is not diaphoretic.  Nursing note and vitals reviewed.   ED Course  Procedures (including critical care time)  DIAGNOSTIC STUDIES: Oxygen Saturation is 100% on RA, normal by my interpretation.    COORDINATION OF CARE: 8:25 PM - Discussed treatment plan with pt at bedside. Pt verbalized understanding and agreed to plan.   Patient checked on [place] - has multiple narcotic prescriptions.    MDM   Final diagnoses:  Dental infection   Afebrile, nontoxic patient with new dental pain.  No obvious abscess. No airway concerns.  Doubt deep  space head or neck infection.  Doubt Ludwig's angina.  D/C home with antibiotic, pain medication and dental follow up.  Discussed findings, treatment, and follow up  with patient.  Pt given return precautions.  Pt verbalizes understanding and agrees with plan.         I personally performed the services described in this documentation, which was scribed in my presence. The recorded information has been reviewed and is accurate.    Clayton Bibles, PA-C 12/25/15 2122  Orlie Dakin, MD 12/26/15 [phone]

## 2015-12-25 NOTE — Discharge Instructions (Signed)
Read the information below.  Use the prescribed medication as directed.  Please discuss all new medications with your pharmacist.  Do not take additional tylenol while taking the prescribed pain medication to avoid overdose.  You may return to the Emergency Department at any time for worsening condition or any new symptoms that concern you.    Please call the oral surgeon listed above within 48 hours to schedule a close follow up appointment.  If you develop fevers, swelling in your face, difficulty swallowing or breathing, return to the ER immediately for a recheck.     Dental Abscess A dental abscess is a collection of pus in or around a tooth. CAUSES This condition is caused by a bacterial infection around the root of the tooth that involves the inner part of the tooth (pulp). It may result from:  Severe tooth decay.  Trauma to the tooth that allows bacteria to enter into the pulp, such as a broken or chipped tooth.  Severe gum disease around a tooth. SYMPTOMS Symptoms of this condition include:  Severe pain in and around the infected tooth.  Swelling and redness around the infected tooth, in the mouth, or in the face.  Tenderness.  Pus drainage.  Bad breath.  Bitter taste in the mouth.  Difficulty swallowing.  Difficulty opening the mouth.  Nausea.  Vomiting.  Chills.  Swollen neck glands.  Fever. DIAGNOSIS This condition is diagnosed with examination of the infected tooth. During the exam, your dentist may tap on the infected tooth. Your dentist will also ask about your medical and dental history and may order X-rays. TREATMENT This condition is treated by eliminating the infection. This may be done with:  Antibiotic medicine.  A root canal. This may be performed to save the tooth.  Pulling (extracting) the tooth. This may also involve draining the abscess. This is done if the tooth cannot be saved. HOME CARE INSTRUCTIONS  Take medicines only as directed by  your dentist.  If you were prescribed antibiotic medicine, finish all of it even if you start to feel better.  Rinse your mouth (gargle) often with salt water to relieve pain or swelling.  Do not drive or operate heavy machinery while taking pain medicine.  Do not apply heat to the outside of your mouth.  Keep all follow-up visits as directed by your dentist. This is important. SEEK MEDICAL CARE IF:  Your pain is worse and is not helped by medicine. SEEK IMMEDIATE MEDICAL CARE IF:  You have a fever or chills.  Your symptoms suddenly get worse.  You have a very bad headache.  You have problems breathing or swallowing.  You have trouble opening your mouth.  You have swelling in your neck or around your eye.   This information is not intended to replace advice given to you by your health care provider. Make sure you discuss any questions you have with your health care provider.   Document Released: 12/12/2005 Document Revised: 04/28/2015 Document Reviewed: 12/09/2014 Elsevier Interactive Patient Education [employer].

## 2015-12-25 NOTE — ED Notes (Signed)
Toothache for 2 days she has been seen in the ed in the past for the same.  lmp none

## 2016-01-04 DIAGNOSIS — A0472 Enterocolitis due to Clostridium difficile, not specified as recurrent: Secondary | ICD-10-CM | POA: Insufficient documentation

## 2017-04-25 ENCOUNTER — Encounter (HOSPITAL_COMMUNITY): Payer: Self-pay

## 2017-04-25 ENCOUNTER — Inpatient Hospital Stay (HOSPITAL_COMMUNITY)
Admission: EM | Admit: 2017-04-25 | Discharge: 2017-05-02 | DRG: 392 | Disposition: A | Discharge status: Home / Self Care | Payer: BLUE CROSS/BLUE SHIELD | Attending: Internal Medicine | Admitting: Internal Medicine

## 2017-04-25 ENCOUNTER — Emergency Department (HOSPITAL_COMMUNITY): Payer: BLUE CROSS/BLUE SHIELD

## 2017-04-25 DIAGNOSIS — K2970 Gastritis, unspecified, without bleeding: Secondary | ICD-10-CM | POA: Diagnosis present

## 2017-04-25 DIAGNOSIS — Z86711 Personal history of pulmonary embolism: Secondary | ICD-10-CM

## 2017-04-25 DIAGNOSIS — F909 Attention-deficit hyperactivity disorder, unspecified type: Secondary | ICD-10-CM | POA: Diagnosis present

## 2017-04-25 DIAGNOSIS — Z923 Personal history of irradiation: Secondary | ICD-10-CM

## 2017-04-25 DIAGNOSIS — E860 Dehydration: Secondary | ICD-10-CM | POA: Diagnosis present

## 2017-04-25 DIAGNOSIS — R112 Nausea with vomiting, unspecified: Secondary | ICD-10-CM | POA: Diagnosis not present

## 2017-04-25 DIAGNOSIS — Z86718 Personal history of other venous thrombosis and embolism: Secondary | ICD-10-CM

## 2017-04-25 DIAGNOSIS — G8929 Other chronic pain: Secondary | ICD-10-CM | POA: Diagnosis present

## 2017-04-25 DIAGNOSIS — K449 Diaphragmatic hernia without obstruction or gangrene: Secondary | ICD-10-CM | POA: Diagnosis present

## 2017-04-25 DIAGNOSIS — Z8542 Personal history of malignant neoplasm of other parts of uterus: Secondary | ICD-10-CM

## 2017-04-25 DIAGNOSIS — E876 Hypokalemia: Secondary | ICD-10-CM | POA: Diagnosis present

## 2017-04-25 DIAGNOSIS — R079 Chest pain, unspecified: Secondary | ICD-10-CM | POA: Diagnosis not present

## 2017-04-25 DIAGNOSIS — K222 Esophageal obstruction: Secondary | ICD-10-CM | POA: Diagnosis not present

## 2017-04-25 DIAGNOSIS — R1310 Dysphagia, unspecified: Secondary | ICD-10-CM

## 2017-04-25 DIAGNOSIS — R55 Syncope and collapse: Secondary | ICD-10-CM | POA: Diagnosis present

## 2017-04-25 LAB — I-STAT CHEM 8, ED
BUN: 13 mg/dL (ref 6–20)
CALCIUM ION: 1.22 mmol/L (ref 1.15–1.40)
CHLORIDE: 106 mmol/L (ref 101–111)
CREATININE: 0.8 mg/dL (ref 0.44–1.00)
GLUCOSE: 120 mg/dL — AB (ref 65–99)
HCT: 46 % (ref 36.0–46.0)
Hemoglobin: 15.6 g/dL — ABNORMAL HIGH (ref 12.0–15.0)
Potassium: 3.2 mmol/L — ABNORMAL LOW (ref 3.5–5.1)
SODIUM: 144 mmol/L (ref 135–145)
TCO2: 25 mmol/L (ref 0–100)

## 2017-04-25 LAB — I-STAT BETA HCG BLOOD, ED (MC, WL, AP ONLY): I-stat hCG, quantitative: <5 m[IU]/mL (ref <5)

## 2017-04-25 LAB — CBC WITH DIFFERENTIAL/PLATELET
Basophils Absolute: 0 10*3/uL (ref 0.0–0.1)
Basophils Relative: 0 %
EOS ABS: 0 10*3/uL (ref 0.0–0.7)
Eosinophils Relative: 0 %
HEMATOCRIT: 44.5 % (ref 36.0–46.0)
HEMOGLOBIN: 15.4 g/dL — AB (ref 12.0–15.0)
LYMPHS ABS: 2.5 10*3/uL (ref 0.7–4.0)
LYMPHS PCT: 25 %
MCH: 30.3 pg (ref 26.0–34.0)
MCHC: 34.6 g/dL (ref 30.0–36.0)
MCV: 87.6 fL (ref 78.0–100.0)
MONOS PCT: 3 %
Monocytes Absolute: 0.3 10*3/uL (ref 0.1–1.0)
NEUTROS PCT: 72 %
Neutro Abs: 7 10*3/uL (ref 1.7–7.7)
Platelets: 321 10*3/uL (ref 150–400)
RBC: 5.08 MIL/uL (ref 3.87–5.11)
RDW: 12.5 % (ref 11.5–15.5)
WBC: 9.8 10*3/uL (ref 4.0–10.5)

## 2017-04-25 LAB — COMPREHENSIVE METABOLIC PANEL
ALBUMIN: 4.7 g/dL (ref 3.5–5.0)
ALT: 23 U/L (ref 14–54)
AST: 25 U/L (ref 15–41)
Alkaline Phosphatase: 95 U/L (ref 38–126)
Anion gap: 14 (ref 5–15)
BILIRUBIN TOTAL: 0.9 mg/dL (ref 0.3–1.2)
BUN: 11 mg/dL (ref 6–20)
CO2: 24 mmol/L (ref 22–32)
CREATININE: 0.81 mg/dL (ref 0.44–1.00)
Calcium: 11.1 mg/dL — ABNORMAL HIGH (ref 8.9–10.3)
Chloride: 104 mmol/L (ref 101–111)
GFR calc Af Amer: >60 mL/min (ref >60)
GLUCOSE: 117 mg/dL — AB (ref 65–99)
Potassium: 3.2 mmol/L — ABNORMAL LOW (ref 3.5–5.1)
Sodium: 142 mmol/L (ref 135–145)
Total Protein: 8 g/dL (ref 6.5–8.1)

## 2017-04-25 LAB — D-DIMER, QUANTITATIVE
D-Dimer, Quant: 0.5 ug/mL-FEU (ref 0.00–0.50)
D-Dimer, Quant: 0.63 ug/mL-FEU — ABNORMAL HIGH (ref 0.00–0.50)

## 2017-04-25 LAB — LIPASE, BLOOD: LIPASE: 43 U/L (ref 11–51)

## 2017-04-25 LAB — TROPONIN I

## 2017-04-25 MED ORDER — SODIUM CHLORIDE 0.9 % IV SOLN
INTRAVENOUS | Status: AC
  Administered 2017-04-25 – 2017-04-26 (×3): via INTRAVENOUS

## 2017-04-25 MED ORDER — HYDROMORPHONE HCL 1 MG/ML IJ SOLN
0.5000 mg | INTRAMUSCULAR | Status: DC | PRN
  Administered 2017-04-25 (×2): 0.5 mg via INTRAVENOUS
  Filled 2017-04-25 (×2): qty 1

## 2017-04-25 MED ORDER — METOCLOPRAMIDE HCL 5 MG/ML IJ SOLN
5.0000 mg | Freq: Four times a day (QID) | INTRAMUSCULAR | Status: DC | PRN
  Administered 2017-04-26: 5 mg via INTRAVENOUS
  Filled 2017-04-25 (×2): qty 2

## 2017-04-25 MED ORDER — SODIUM CHLORIDE 0.9 % IV SOLN
INTRAVENOUS | Status: DC
Start: 2017-04-25 — End: 2017-04-25
  Administered 2017-04-25: 16:00:00 via INTRAVENOUS

## 2017-04-25 MED ORDER — ACETAMINOPHEN 650 MG RE SUPP: 650.0000 mg | Freq: Four times a day (QID) | RECTAL | Status: DC | PRN

## 2017-04-25 MED ORDER — METOCLOPRAMIDE HCL 5 MG/ML IJ SOLN
10.0000 mg | Freq: Once | INTRAMUSCULAR | Status: AC
  Administered 2017-04-25: 10 mg via INTRAVENOUS
  Filled 2017-04-25: qty 2

## 2017-04-25 MED ORDER — ONDANSETRON HCL 4 MG/2ML IJ SOLN
4.0000 mg | Freq: Once | INTRAMUSCULAR | Status: AC
  Administered 2017-04-25: 4 mg via INTRAVENOUS

## 2017-04-25 MED ORDER — SODIUM CHLORIDE 0.9 % IV SOLN
30.0000 meq | Freq: Once | INTRAVENOUS | Status: AC
  Administered 2017-04-25: 30 meq via INTRAVENOUS
  Filled 2017-04-25: qty 15

## 2017-04-25 MED ORDER — GI COCKTAIL ~~LOC~~
30.0000 mL | Freq: Once | ORAL | Status: AC
  Administered 2017-04-25: 30 mL via ORAL
  Filled 2017-04-25: qty 30

## 2017-04-25 MED ORDER — SODIUM CHLORIDE 0.9 % IV BOLUS (SEPSIS)
1000.0000 mL | Freq: Once | INTRAVENOUS | Status: AC
  Administered 2017-04-25: 1000 mL via INTRAVENOUS

## 2017-04-25 MED ORDER — ONDANSETRON HCL 4 MG/2ML IJ SOLN
INTRAMUSCULAR | Status: AC
  Filled 2017-04-25: qty 2

## 2017-04-25 MED ORDER — ONDANSETRON HCL 4 MG PO TABS
4.0000 mg | ORAL_TABLET | Freq: Four times a day (QID) | ORAL | Status: DC | PRN
  Administered 2017-04-29: 4 mg via ORAL
  Filled 2017-04-25: qty 1

## 2017-04-25 MED ORDER — HYDROCODONE-ACETAMINOPHEN 5-325 MG PO TABS: 1.0000 | ORAL_TABLET | Freq: Once | ORAL | Status: DC

## 2017-04-25 MED ORDER — HYDROMORPHONE HCL 1 MG/ML IJ SOLN
0.2500 mg | INTRAMUSCULAR | Status: DC | PRN
  Administered 2017-04-25 – 2017-04-30 (×27): 0.25 mg via INTRAVENOUS
  Filled 2017-04-25 (×26): qty 1

## 2017-04-25 MED ORDER — AMPHETAMINE-DEXTROAMPHETAMINE 10 MG PO TABS
20.0000 mg | ORAL_TABLET | Freq: Three times a day (TID) | ORAL | Status: DC
  Filled 2017-04-25 (×12): qty 2

## 2017-04-25 MED ORDER — ALBUTEROL SULFATE (2.5 MG/3ML) 0.083% IN NEBU: 3.0000 mL | INHALATION_SOLUTION | RESPIRATORY_TRACT | Status: DC | PRN

## 2017-04-25 MED ORDER — PROMETHAZINE HCL 25 MG/ML IJ SOLN: 12.5000 mg | Freq: Once | INTRAMUSCULAR | Status: DC

## 2017-04-25 MED ORDER — ACETAMINOPHEN 325 MG PO TABS: 650.0000 mg | ORAL_TABLET | Freq: Four times a day (QID) | ORAL | Status: DC | PRN

## 2017-04-25 MED ORDER — METOCLOPRAMIDE HCL 5 MG/ML IJ SOLN: 10.0000 mg | Freq: Once | INTRAMUSCULAR | Status: DC

## 2017-04-25 MED ORDER — PROMETHAZINE HCL 25 MG/ML IJ SOLN
12.5000 mg | Freq: Once | INTRAMUSCULAR | Status: AC
  Administered 2017-04-25: 12.5 mg via INTRAVENOUS
  Filled 2017-04-25: qty 1

## 2017-04-25 MED ORDER — IOPAMIDOL (ISOVUE-300) INJECTION 61%
INTRAVENOUS | Status: AC
  Administered 2017-04-25: 100 mL
  Filled 2017-04-25: qty 100

## 2017-04-25 MED ORDER — ONDANSETRON HCL 4 MG/2ML IJ SOLN
4.0000 mg | Freq: Four times a day (QID) | INTRAMUSCULAR | Status: DC | PRN
  Administered 2017-04-25 – 2017-05-02 (×14): 4 mg via INTRAVENOUS
  Filled 2017-04-25 (×16): qty 2

## 2017-04-25 NOTE — ED Provider Notes (Signed)
Assumed care from Dr. Tomi Bamberger at 4 PM. Briefly, the patient is a 41 y.o. female with PMHx of  has a past medical history of DVT (deep venous thrombosis) ([place]) and Uterine cancer ([place]). here with nausea, vomiting, chest pain, upper abd pain. Feels lightheaded and weak. S/p chole, TAH with endometrial CA, and had appendectomy as well, h/o DVT. CT A/P pending.  Labs Reviewed  COMPREHENSIVE METABOLIC PANEL - Abnormal; Notable for the following:       Result Value   Potassium 3.2 (*)    Glucose, Bld 117 (*)    Calcium 11.1 (*)    All other components within normal limits  CBC WITH DIFFERENTIAL/PLATELET - Abnormal; Notable for the following:    Hemoglobin 15.4 (*)    All other components within normal limits  I-STAT CHEM 8, ED - Abnormal; Notable for the following:    Potassium 3.2 (*)    Glucose, Bld 120 (*)    Hemoglobin 15.6 (*)    All other components within normal limits  LIPASE, BLOOD  D-DIMER, QUANTITATIVE (NOT AT [facility])  URINALYSIS, ROUTINE W REFLEX MICROSCOPIC  I-STAT BETA HCG BLOOD, ED ([facility], [facility], [facility] ONLY)    Course of Care: -On repeat assessment, pt feels improved with phenergan. CT scan neg. Suspect viral GI illness. Will PO challenge, likely d/c once able to eat/drink. Pt afebrile, well appearing. Abdomen soft, NT, ND, without apparent surgical abnormality. WBC normal. -Patient continues to actively vomiting in the emergency department despite multiple rounds of IV Zofran as well as Phenergan. She is unable to eat or drink. Given her inability to tolerate by mouth, likely due to viral GI illness versus gastritis, will admit for IV fluids. IV potassium given due to inability to tolerate by mouth. Of note, patient also mildly hypercalcemic but I suspect this is due to dehydration.     Duffy Bruce, MD 04/25/17 [phone]

## 2017-04-25 NOTE — ED Notes (Signed)
Pt needs repeat d-dimer per lab

## 2017-04-25 NOTE — Progress Notes (Signed)
Pt arrived to 5w20 at 2320.Alert and oriented x 4. Ambulated from stretcher to bed. Husband at bedside. VS collected, pt placed on cardiac monitor. No signs of acute distress. Pt advised about valuable policy, oriented to room and equipment, instructed to use call bell for assistance and call bell within pt reach. Will continue to monitor and treat pt per MD orders.

## 2017-04-25 NOTE — H&P (Signed)
History and Physical    [name] Horn UVO:[id number] DOB: [birth year] DOA: 04/25/2017  PCP: Thurman Coyer, MD  Patient coming from: Home.  Chief Complaint: Nausea vomiting.  HPI: [name] Horn is a 41 y.o. female with history of uterine cancer status post hysterectomy and radiation therapy and history of DVT off medications now and chronic pain presents to the ER because of persistent nausea vomiting with crampy abdominal pain over the last 24 hours. Patient also had a few episodes of diarrhea. Denies any recent travel or sick contacts. Patient states since morning patient also has been having persistent chest pain which was stabbing in nature radiating to back.  ED Course: In the ER patient was given fluid bolus and pain relief medication. CT of the abdomen and pelvis did not show anything acute. EKG shows normal sinus rhythm with troponin negative. Chest x-ray unremarkable.  Review of Systems: As per HPI, rest all negative.   Past Medical History:  Diagnosis Date  . DVT (deep venous thrombosis) ([place])    x2  . Uterine cancer [facility])     Past Surgical History:  Procedure Laterality Date  . APPENDECTOMY    . CHOLECYSTECTOMY    . KNEE SURGERY     x3  . MINIMALLY INVASIVE FORAMINOTOMY CERVICAL SPINE     C6-T1, Nitka  . TONSILLECTOMY    . TOTAL ABDOMINAL HYSTERECTOMY     Sarcoma, s/p XRT     reports that she has never smoked. She has never used smokeless tobacco. She reports that she drinks alcohol. She reports that she does not use drugs.  Allergies  Allergen Reactions  . Sulfa Antibiotics Anaphylaxis, Shortness Of Breath, Swelling and Rash    Angioedema (also)  . Sulfonamide Derivatives Hives, Shortness Of Breath and Swelling    TONGUE SWELLS  . Benadryl [Diphenhydramine Hcl] Other (See Comments)    Hyperactivity     Family History  Problem Relation Age of Onset  . Hypertension    . Thyroid disease    . Hyperlipidemia    . Colon cancer      grandmother     Prior to Admission medications   Medication Sig Start Date End Date Taking? Authorizing Provider  albuterol (VENTOLIN HFA) 108 (90 BASE) MCG/ACT inhaler Inhale 2 puffs into the lungs every 4 (four) hours as needed for wheezing or shortness of breath.    Yes Historical Provider, MD  amphetamine-dextroamphetamine (ADDERALL) 20 MG tablet Take 20 mg by mouth 3 (three) times daily.   Yes Historical Provider, MD  omeprazole (PRILOSEC) 20 MG capsule Take 20 mg by mouth daily before breakfast. 11/05/10  Yes Historical Provider, MD  oxyCODONE-acetaminophen (PERCOCET/ROXICET) 5-325 MG tablet Take 1-2 tablets by mouth every 4 (four) hours as needed for moderate pain or severe pain. 12/25/15  Yes Clayton Bibles, PA-C  amoxicillin (AMOXIL) 500 MG capsule Take 1 capsule (500 mg total) by mouth 3 (three) times daily. Patient not taking: Reported on 04/25/2017 11/11/15   Delsa Grana, PA-C  cyclobenzaprine (FLEXERIL) 10 MG tablet Take 1 tablet (10 mg total) by mouth 3 (three) times daily as needed for muscle spasms. Patient not taking: Reported on 04/25/2017 12/25/13   Shanon Rosser, MD  metroNIDAZOLE (FLAGYL) 500 MG tablet Take 1 tablet (500 mg total) by mouth 2 (two) times daily. Patient not taking: Reported on 04/25/2017 11/11/15   Delsa Grana, PA-C    Physical Exam: Vitals:   04/25/17 2015 04/25/17 2045 04/25/17 2145 04/25/17 2200  BP: 102/64 114/81 106/67  106/78  Pulse: (!) 59 61 70 (!) 55  Resp: 20 17 16 16   Temp:      TempSrc:      SpO2: 94% 99% 98% 100%      Constitutional: Moderately built and nourished. Vitals:   04/25/17 2015 04/25/17 2045 04/25/17 2145 04/25/17 2200  BP: 102/64 114/81 106/67 106/78  Pulse: (!) 59 61 70 (!) 55  Resp: 20 17 16 16   Temp:      TempSrc:      SpO2: 94% 99% 98% 100%   Eyes: Anicteric no pallor. ENMT: No discharge from the ears eyes nose or mouth. Neck: No mass felt. No neck rigidity. Respiratory: No rhonchi or crepitations. Cardiovascular: S1-S2 no murmurs  appreciated. Abdomen: Soft nontender bowel sounds present. No guarding or rigidity. Musculoskeletal: No edema. No joint effusion. Skin: No rash. Skin appears warm. Neurologic: Alert awake oriented to time place and person. Moves all extremities. Psychiatric: Appears normal. Normal affect.   Labs on Admission: I have personally reviewed following labs and imaging studies  CBC:  Recent Labs Lab 04/25/17 1412 04/25/17 1429  WBC 9.8  --   NEUTROABS 7.0  --   HGB 15.4* 15.6*  HCT 44.5 46.0  MCV 87.6  --   PLT 321  --    Basic Metabolic Panel:  Recent Labs Lab 04/25/17 1412 04/25/17 1429  NA 142 144  K 3.2* 3.2*  CL 104 106  CO2 24  --   GLUCOSE 117* 120*  BUN 11 13  CREATININE 0.81 0.80  CALCIUM 11.1*  --    GFR: CrCl cannot be calculated (Unknown ideal weight.). Liver Function Tests:  Recent Labs Lab 04/25/17 1412  AST 25  ALT 23  ALKPHOS 95  BILITOT 0.9  PROT 8.0  ALBUMIN 4.7    Recent Labs Lab 04/25/17 1412  LIPASE 43   No results for input(s): AMMONIA in the last 168 hours. Coagulation Profile: No results for input(s): INR, PROTIME in the last 168 hours. Cardiac Enzymes: No results for input(s): CKTOTAL, CKMB, CKMBINDEX, TROPONINI in the last 168 hours. BNP (last 3 results) No results for input(s): PROBNP in the last 8760 hours. HbA1C: No results for input(s): HGBA1C in the last 72 hours. CBG: No results for input(s): GLUCAP in the last 168 hours. Lipid Profile: No results for input(s): CHOL, HDL, LDLCALC, TRIG, CHOLHDL, LDLDIRECT in the last 72 hours. Thyroid Function Tests: No results for input(s): TSH, T4TOTAL, FREET4, T3FREE, THYROIDAB in the last 72 hours. Anemia Panel: No results for input(s): VITAMINB12, FOLATE, FERRITIN, TIBC, IRON, RETICCTPCT in the last 72 hours. Urine analysis:    Component Value Date/Time   COLORURINE ORANGE BIOCHEMICALS MAY BE AFFECTED BY COLOR (A) 05/27/2009 1500   APPEARANCEUR CLEAR 05/27/2009 1500    LABSPEC 1.006 05/27/2009 1500   PHURINE 6.0 05/27/2009 1500   GLUCOSEU NEGATIVE 05/27/2009 1500   HGBUR NEGATIVE 05/27/2009 1500   HGBUR negative 07/25/2007 1131   BILIRUBINUR NEGATIVE 05/27/2009 1500   KETONESUR NEGATIVE 05/27/2009 1500   PROTEINUR NEGATIVE 05/27/2009 1500   UROBILINOGEN 1.0 05/27/2009 1500   NITRITE POSITIVE (A) 05/27/2009 1500   LEUKOCYTESUR TRACE (A) 05/27/2009 1500   Sepsis Labs: @LABRCNTIP (procalcitonin:4,lacticidven:4) )No results found for this or any previous visit (from the past 240 hour(s)).   Radiological Exams on Admission: Ct Abdomen Pelvis W Contrast  Result Date: 04/25/2017 CLINICAL DATA:  Nausea and vomiting since last night. Generalized abdominal pain. History uterine cancer and c difficile colitis. Cholecystectomy. Appendectomy. EXAM: CT ABDOMEN AND  PELVIS WITH CONTRAST TECHNIQUE: Multidetector CT imaging of the abdomen and pelvis was performed using the standard protocol following bolus administration of intravenous contrast. CONTRAST:  172mL ISOVUE-300 IOPAMIDOL (ISOVUE-300) INJECTION 61% COMPARISON:  02/27/2017 FINDINGS: Lower chest: Clear lung bases. Normal heart size without pericardial or pleural effusion. Tiny hiatal hernia. Hepatobiliary: Mild hepatic steatosis, without focal liver lesion. Normal gallbladder, without biliary ductal dilatation. Pancreas: Fatty replacement involving the pancreatic head and uncinate process. No duct dilatation or dominant mass. Spleen: Normal in size, without focal abnormality. Adrenals/Urinary Tract: Normal adrenal glands. Normal kidneys, without hydronephrosis. Normal urinary bladder. Stomach/Bowel: Normal remainder of the stomach. Decompressed colon. Normal terminal ileum. Appendectomy. Proximal small bowel is also collapsed. Given this mild limitation, no evidence of small bowel pathology. Vascular/Lymphatic: Normal caliber of the aorta and branch vessels. No retroperitoneal or retrocrural adenopathy. Pelvic node  dissection, without sidewall adenopathy. Reproductive: Hysterectomy.  No adnexal mass. Other: No significant free fluid. No evidence of omental or peritoneal disease. Musculoskeletal: No acute osseous abnormality. IMPRESSION: 1.  No acute process in the abdomen or pelvis. 2. Hepatic steatosis. 3. Hysterectomy, without evidence of metastatic disease. Electronically Signed   By: Abigail Miyamoto M.D.   On: 04/25/2017 16:45   Dg Chest Portable 1 View  Result Date: 04/25/2017 CLINICAL DATA:  Chest pain and tingling beginning last night. Vomiting. EXAM: PORTABLE CHEST 1 VIEW COMPARISON:  PA and lateral chest 02/12/2015. FINDINGS: The lungs are clear. Heart size is normal. No pneumothorax or pleural effusion. No bony abnormality. IMPRESSION: Negative chest. Electronically Signed   By: Inge Rise M.D.   On: 04/25/2017 15:06    EKG: Independently reviewed. Normal sinus rhythm.  Assessment/Plan Principal Problem:   Nausea & vomiting Active Problems:   Chest pain    1. Nausea vomiting - likely viral gastroenteritis. Since patient also has mild area and has had previous episodes of C. difficile and had used antibiotics for months ago will check stool for C. difficile. Continue with hydration. 2. Chest pain appears atypical. We'll cycle cardiac murmur check 2-D echo d-dimer. If pain persist may consider CT chest. 3. History of transient cancer status post hysterectomy and radiation therapy. 4. Previous history of DVT.   DVT prophylaxis: SCDs. Code Status: Full code.  Family Communication: Discussed with patient.  Disposition Plan: Home.  Consults called: None.  Admission status: Observation.    Rise Patience MD [facility] Pager [phone].  If 7PM-7AM, please contact night-coverage [email or web address] Password [email or web address]  04/25/2017, 10:10 PM

## 2017-04-25 NOTE — ED Notes (Signed)
Patient reports she has not vomited in approx 1 hour following administration of Dilaudid and Reglan.

## 2017-04-25 NOTE — ED Provider Notes (Signed)
[place] DEPT Provider Note   CSN: [id number] Arrival date & time: 04/25/17  1405     History   Chief Complaint Chief Complaint  Patient presents with  . Chest Pain  . Loss of Consciousness    HPI [name] Horn is a 41 y.o. female.  HPI Patient presents to the emergency room with complaints of nausea, vomiting, chest pain and abdominal pain. Patient states last evening she started having sharp pain in her chest radiating to her back. She then started having multiple episodes of nausea and vomiting. Associated with that nausea and vomiting she's having upper abdominal pain.  Patient states her symptoms persisted. She feels lightheaded and weak. She had a syncopal episode today associated with her symptoms. Patient brought in by family members. They drove up to the ambulance bay. Patient has had several surgeries including appendectomy, cholecystectomy, and a total abdominal hysterectomy in the past. Past Medical History:  Diagnosis Date  . DVT (deep venous thrombosis) ([place])    x2  . Uterine cancer [facility])     Patient Active Problem List   Diagnosis Date Noted  . ANKLE, PAIN 11/24/2010  . ANXIETY 10/11/2010  . DVT 10/11/2010  . COSTOCHONDRITIS 03/03/2008  . CELLULITIS, GREAT TOE 12/01/2007  . SINUSITIS, ACUTE NOS 09/25/2007  . MENOPAUSE, PREMATURE 07/20/2007  . UTERINE CANCER, HX OF 07/16/2007  . Other postprocedural status(V45.89) 07/16/2007    Past Surgical History:  Procedure Laterality Date  . APPENDECTOMY    . CHOLECYSTECTOMY    . KNEE SURGERY     x3  . MINIMALLY INVASIVE FORAMINOTOMY CERVICAL SPINE     C6-T1, Nitka  . TONSILLECTOMY    . TOTAL ABDOMINAL HYSTERECTOMY     Sarcoma, s/p XRT    OB History    No data available       Home Medications    Prior to Admission medications   Medication Sig Start Date End Date Taking? Authorizing Provider  albuterol (VENTOLIN HFA) 108 (90 BASE) MCG/ACT inhaler Inhale 2 puffs into the lungs every 4 (four)  hours as needed.      Historical Provider, MD  amoxicillin (AMOXIL) 500 MG capsule Take 1 capsule (500 mg total) by mouth 3 (three) times daily. 11/11/15   Delsa Grana, PA-C  amphetamine-dextroamphetamine (ADDERALL) 20 MG tablet Take 20 mg by mouth 3 (three) times daily.    Historical Provider, MD  busPIRone (BUSPAR) 15 MG tablet Take 15 mg by mouth 2 (two) times daily.      Historical Provider, MD  cyclobenzaprine (FLEXERIL) 10 MG tablet  03/24/13   Historical Provider, MD  cyclobenzaprine (FLEXERIL) 10 MG tablet Take 1 tablet (10 mg total) by mouth 3 (three) times daily as needed for muscle spasms. 12/25/13   John Molpus, MD  metroNIDAZOLE (FLAGYL) 500 MG tablet Take 1 tablet (500 mg total) by mouth 2 (two) times daily. 11/11/15   Delsa Grana, PA-C  omeprazole (PRILOSEC) 20 MG capsule Take 20 mg by mouth daily.      Historical Provider, MD  oxyCODONE-acetaminophen (PERCOCET/ROXICET) 5-325 MG tablet Take 1-2 tablets by mouth every 4 (four) hours as needed for moderate pain or severe pain. 12/25/15   Clayton Bibles, PA-C  warfarin (COUMADIN) 5 MG tablet Take 5 mg by mouth as directed.      Historical Provider, MD    Family History Family History  Problem Relation Age of Onset  . Hypertension    . Thyroid disease    . Hyperlipidemia    . Colon  cancer      grandmother    Social History Social History  Substance Use Topics  . Smoking status: Never Smoker  . Smokeless tobacco: Never Used  . Alcohol use Yes     Allergies   Sulfa antibiotics; Sulfonamide derivatives; and Benadryl [diphenhydramine hcl]   Review of Systems Review of Systems  All other systems reviewed and are negative.    Physical Exam Updated Vital Signs BP 136/67   Pulse 62   Temp 99.8 F (37.7 C) (Temporal)   Resp 15   SpO2 100%   Physical Exam  Constitutional: No distress.  HENT:  Head: Normocephalic and atraumatic.  Right Ear: External ear normal.  Left Ear: External ear normal.  Eyes: Conjunctivae are  normal. Right eye exhibits no discharge. Left eye exhibits no discharge. No scleral icterus.  Neck: Neck supple. No tracheal deviation present.  Cardiovascular: Normal rate, regular rhythm and intact distal pulses.   Pulmonary/Chest: Effort normal and breath sounds normal. No stridor. No respiratory distress. She has no wheezes. She has no rales.  Hyperventilating  Abdominal: Soft. Bowel sounds are normal. She exhibits no distension. There is tenderness in the right upper quadrant and epigastric area. There is no rebound and no guarding.  Musculoskeletal: She exhibits no edema or tenderness.  Neurological: She is alert. She has normal strength. No cranial nerve deficit (no facial droop, extraocular movements intact, no slurred speech) or sensory deficit. She exhibits normal muscle tone. She displays no seizure activity. Coordination normal.  Skin: Skin is warm and dry. No rash noted.  Psychiatric: She has a normal mood and affect.  Nursing note and vitals reviewed.    ED Treatments / Results  Labs (all labs ordered are listed, but only abnormal results are displayed) Labs Reviewed  COMPREHENSIVE METABOLIC PANEL - Abnormal; Notable for the following:       Result Value   Potassium 3.2 (*)    Glucose, Bld 117 (*)    Calcium 11.1 (*)    All other components within normal limits  CBC WITH DIFFERENTIAL/PLATELET - Abnormal; Notable for the following:    Hemoglobin 15.4 (*)    All other components within normal limits  I-STAT CHEM 8, ED - Abnormal; Notable for the following:    Potassium 3.2 (*)    Glucose, Bld 120 (*)    Hemoglobin 15.6 (*)    All other components within normal limits  LIPASE, BLOOD  D-DIMER, QUANTITATIVE (NOT AT [facility])  URINALYSIS, ROUTINE W REFLEX MICROSCOPIC  I-STAT BETA HCG BLOOD, ED ([facility], [facility], [facility] ONLY)    EKG  EKG Interpretation  Date/Time:  Tuesday Apr 25 2017 14:08:42 EDT Ventricular Rate:  78 PR Interval:    QRS Duration: 87 QT Interval:  421 QTC  Calculation: 480 R Axis:   15 Text Interpretation:  Sinus rhythm Probable left atrial enlargement RSR' in V1 or V2, right VCD or RVH Since last tracing rate slower Confirmed by Maelynn Moroney  MD-J, Aniston Christman ([id number]) on 04/25/2017 2:16:47 PM       Radiology Dg Chest Portable 1 View  Result Date: 04/25/2017 CLINICAL DATA:  Chest pain and tingling beginning last night. Vomiting. EXAM: PORTABLE CHEST 1 VIEW COMPARISON:  PA and lateral chest 02/12/2015. FINDINGS: The lungs are clear. Heart size is normal. No pneumothorax or pleural effusion. No bony abnormality. IMPRESSION: Negative chest. Electronically Signed   By: Inge Rise M.D.   On: 04/25/2017 15:06    Procedures Procedures (including critical care time)  Medications Ordered in  ED Medications  sodium chloride 0.9 % bolus 1,000 mL (0 mLs Intravenous Stopped 04/25/17 1536)    And  0.9 %  sodium chloride infusion ( Intravenous New Bag/Given 04/25/17 1538)  HYDROmorphone (DILAUDID) injection 0.5 mg (not administered)  ondansetron (ZOFRAN) 4 MG/2ML injection (not administered)  ondansetron (ZOFRAN) injection 4 mg (4 mg Intravenous Given 04/25/17 1421)     Initial Impression / Assessment and Plan / ED Course  I have reviewed the triage vital signs and the nursing notes.  Pertinent labs & imaging results that were available during my care of the patient were reviewed by me and considered in my medical decision making (see chart for details).  Clinical Course as of Apr 26 1599  Tue Apr 25, 2017  1413 Patient was seen at [facility] 2 months ago for abdominal pain. Negative CT abdomen pelvis at that time  [JK]    Clinical Course User Index [JK] Dorie Rank, MD   Pt presents to the ED with complaints of chest pain, abdominal pain, vomiting.   Here in the ED she continues to vomit.    D dimer is negative.  Initial troponin and EKG are normal.  Chest pain may be related to her vomiting associated with the abdominal pain.   Will ct abdomen  /pelvis to evaluate further.  Will turn case to Dr Ellender Hose   Final Clinical Impressions(s) / ED Diagnoses  pending   Dorie Rank, MD 04/25/17 1600

## 2017-04-25 NOTE — ED Notes (Signed)
Dr. Kakrakandy at bedside at this time 

## 2017-04-25 NOTE — ED Triage Notes (Signed)
Pt arrived to EMS bay by private vehicle with altered mental status. Pt arrives to room able to answer questions. She reports having chest pain with tingling last night and a syncopal episode today. Hx of uterine CA.

## 2017-04-26 ENCOUNTER — Other Ambulatory Visit (HOSPITAL_COMMUNITY): Payer: BLUE CROSS/BLUE SHIELD

## 2017-04-26 DIAGNOSIS — Z8542 Personal history of malignant neoplasm of other parts of uterus: Secondary | ICD-10-CM | POA: Diagnosis not present

## 2017-04-26 DIAGNOSIS — K222 Esophageal obstruction: Secondary | ICD-10-CM | POA: Diagnosis present

## 2017-04-26 DIAGNOSIS — R079 Chest pain, unspecified: Secondary | ICD-10-CM | POA: Diagnosis not present

## 2017-04-26 DIAGNOSIS — R55 Syncope and collapse: Secondary | ICD-10-CM | POA: Diagnosis present

## 2017-04-26 DIAGNOSIS — F909 Attention-deficit hyperactivity disorder, unspecified type: Secondary | ICD-10-CM | POA: Diagnosis present

## 2017-04-26 DIAGNOSIS — R112 Nausea with vomiting, unspecified: Secondary | ICD-10-CM | POA: Diagnosis present

## 2017-04-26 DIAGNOSIS — G8929 Other chronic pain: Secondary | ICD-10-CM | POA: Diagnosis present

## 2017-04-26 DIAGNOSIS — E876 Hypokalemia: Secondary | ICD-10-CM | POA: Diagnosis not present

## 2017-04-26 DIAGNOSIS — E860 Dehydration: Secondary | ICD-10-CM

## 2017-04-26 DIAGNOSIS — Z86711 Personal history of pulmonary embolism: Secondary | ICD-10-CM | POA: Diagnosis not present

## 2017-04-26 DIAGNOSIS — Z923 Personal history of irradiation: Secondary | ICD-10-CM | POA: Diagnosis not present

## 2017-04-26 DIAGNOSIS — K449 Diaphragmatic hernia without obstruction or gangrene: Secondary | ICD-10-CM | POA: Diagnosis present

## 2017-04-26 DIAGNOSIS — Z86718 Personal history of other venous thrombosis and embolism: Secondary | ICD-10-CM | POA: Diagnosis not present

## 2017-04-26 DIAGNOSIS — R1310 Dysphagia, unspecified: Secondary | ICD-10-CM | POA: Diagnosis not present

## 2017-04-26 DIAGNOSIS — K2970 Gastritis, unspecified, without bleeding: Secondary | ICD-10-CM | POA: Diagnosis present

## 2017-04-26 DIAGNOSIS — R072 Precordial pain: Secondary | ICD-10-CM | POA: Diagnosis not present

## 2017-04-26 LAB — URINALYSIS, ROUTINE W REFLEX MICROSCOPIC
BILIRUBIN URINE: NEGATIVE
GLUCOSE, UA: NEGATIVE mg/dL
Hgb urine dipstick: NEGATIVE
Ketones, ur: NEGATIVE mg/dL
Leukocytes, UA: NEGATIVE
NITRITE: NEGATIVE
PH: 7 (ref 5.0–8.0)
Protein, ur: NEGATIVE mg/dL
Specific Gravity, Urine: >1.046 — ABNORMAL HIGH (ref 1.005–1.030)

## 2017-04-26 LAB — CBC WITH DIFFERENTIAL/PLATELET
BASOS ABS: 0 10*3/uL (ref 0.0–0.1)
BASOS PCT: 0 %
Eosinophils Absolute: 0 10*3/uL (ref 0.0–0.7)
Eosinophils Relative: 0 %
HEMATOCRIT: 37.2 % (ref 36.0–46.0)
HEMOGLOBIN: 12.1 g/dL (ref 12.0–15.0)
LYMPHS PCT: 38 %
Lymphs Abs: 3.6 10*3/uL (ref 0.7–4.0)
MCH: 29.4 pg (ref 26.0–34.0)
MCHC: 32.5 g/dL (ref 30.0–36.0)
MCV: 90.3 fL (ref 78.0–100.0)
Monocytes Absolute: 0.5 10*3/uL (ref 0.1–1.0)
Monocytes Relative: 5 %
NEUTROS ABS: 5.3 10*3/uL (ref 1.7–7.7)
NEUTROS PCT: 56 %
Platelets: 250 10*3/uL (ref 150–400)
RBC: 4.12 MIL/uL (ref 3.87–5.11)
RDW: 12.7 % (ref 11.5–15.5)
WBC: 9.5 10*3/uL (ref 4.0–10.5)

## 2017-04-26 LAB — COMPREHENSIVE METABOLIC PANEL
ALBUMIN: 3.7 g/dL (ref 3.5–5.0)
ALT: 22 U/L (ref 14–54)
AST: 24 U/L (ref 15–41)
Alkaline Phosphatase: 64 U/L (ref 38–126)
Anion gap: 7 (ref 5–15)
BILIRUBIN TOTAL: 0.7 mg/dL (ref 0.3–1.2)
BUN: 11 mg/dL (ref 6–20)
CO2: 26 mmol/L (ref 22–32)
CREATININE: 0.83 mg/dL (ref 0.44–1.00)
Calcium: 8.7 mg/dL — ABNORMAL LOW (ref 8.9–10.3)
Chloride: 110 mmol/L (ref 101–111)
GFR calc Af Amer: >60 mL/min (ref >60)
GFR calc non Af Amer: >60 mL/min (ref >60)
GLUCOSE: 97 mg/dL (ref 65–99)
POTASSIUM: 3.6 mmol/L (ref 3.5–5.1)
Sodium: 143 mmol/L (ref 135–145)
TOTAL PROTEIN: 5.9 g/dL — AB (ref 6.5–8.1)

## 2017-04-26 LAB — RAPID URINE DRUG SCREEN, HOSP PERFORMED
Amphetamines: NOT DETECTED
BARBITURATES: NOT DETECTED
Benzodiazepines: NOT DETECTED
Cocaine: NOT DETECTED
Opiates: POSITIVE — AB
Tetrahydrocannabinol: NOT DETECTED

## 2017-04-26 LAB — TROPONIN I

## 2017-04-26 LAB — HIV ANTIBODY (ROUTINE TESTING W REFLEX): HIV SCREEN 4TH GENERATION: NONREACTIVE

## 2017-04-26 MED ORDER — METOCLOPRAMIDE HCL 5 MG/ML IJ SOLN
5.0000 mg | Freq: Once | INTRAMUSCULAR | Status: AC
  Administered 2017-04-26: 5 mg via INTRAVENOUS

## 2017-04-26 MED ORDER — PROMETHAZINE HCL 25 MG/ML IJ SOLN
12.5000 mg | Freq: Three times a day (TID) | INTRAMUSCULAR | Status: DC | PRN
  Administered 2017-04-26 – 2017-04-27 (×3): 12.5 mg via INTRAVENOUS
  Filled 2017-04-26 (×3): qty 1

## 2017-04-26 MED ORDER — METOCLOPRAMIDE HCL 5 MG/ML IJ SOLN
5.0000 mg | Freq: Four times a day (QID) | INTRAMUSCULAR | Status: DC
  Administered 2017-04-26 – 2017-05-02 (×25): 5 mg via INTRAVENOUS
  Filled 2017-04-26 (×24): qty 2

## 2017-04-26 NOTE — Progress Notes (Signed)
Zofran given at 2348, Reglan given at 0048. Pt relieved for few hours. At 0450 pt called RN c/o nausea, pt stated she feels she will vomit. No nausea meds can be given to pt at this time. Baltazar Najjar, Np notified.

## 2017-04-26 NOTE — Progress Notes (Signed)
Pt vomiting at the shift change, day shift RN will contact MD, no PRN nausea meds available at this time.

## 2017-04-26 NOTE — Progress Notes (Signed)
PROGRESS NOTE    [name] Horn  JSE:[id number] DOB: 02-15-1976 DOA: 04/25/2017 PCP: Thurman Coyer, MD   Outpatient Specialists:    Brief Narrative:  [name] Horn is a 41 y.o. female with history of uterine cancer status post hysterectomy and radiation therapy and history of DVT off medications now and chronic pain presents to the ER because of persistent nausea vomiting with crampy abdominal pain over the last 24 hours. Patient also had a few episodes of diarrhea. Denies any recent travel or sick contacts. Patient states since morning patient also has been having persistent chest pain which was stabbing in nature radiating to back.  ED Course: In the ER patient was given fluid bolus and pain relief medication. CT of the abdomen and pelvis did not show anything acute. EKG shows normal sinus rhythm with troponin negative. Chest x-ray unremarkable.   Assessment & Plan:   Principal Problem:   Nausea & vomiting Active Problems:   Chest pain   N/V- few episodes of diarrhea -h/o c diff -recent negative in March at [facility] -appears to have CT scan yearly either here or at [facility] -all have been benign -reglan scheduled -PRN phenergan/zofran -low dose dilaudid  Dehydration- concentrated blood and urine -continue IVF  Chest pain -related to eating/vomiting -CE: negative - d dimer: checked x2- normal and 2nd was only slightly elevated -lipase normal      DVT prophylaxis:  SCD's  Code Status: Full Code   Family Communication: At bedside  Disposition Plan:     Consultants:         Subjective: Patient reports family hx of abdominal issues-- uncle was "in a study" here  Objective: Vitals:   04/25/17 2230 04/25/17 2321 04/25/17 2337 04/26/17 0554  BP: 110/65 122/60  112/72  Pulse: (!) 53 (!) 56  (!) 56  Resp: 19 18  18   Temp:  98.4 F (36.9 C)  99.1 F (37.3 C)  TempSrc:  Oral    SpO2: 96% 100%  98%  Height:   5\' 4"  (1.626 m)     Intake/Output  Summary (Last 24 hours) at 04/26/17 0841 Last data filed at 04/26/17 0756  Gross per 24 hour  Intake          1213.33 ml  Output              650 ml  Net           563.33 ml   There were no vitals filed for this visit.  Examination:  General exam: Appears calm and comfortable  Respiratory system: Clear to auscultation. Respiratory effort normal. Cardiovascular system: S1 & S2 heard, RRR. No JVD, murmurs, rubs, gallops or clicks. No pedal edema. Gastrointestinal system: Abdomen is nondistended, soft and nontender. No organomegaly or masses felt. Normal bowel sounds heard. Central nervous system: Alert and oriented. No focal neurological deficits. Extremities: Symmetric 5 x 5 power. Skin: No rashes, lesions or ulcers     Data Reviewed: I have personally reviewed following labs and imaging studies  CBC:  Recent Labs Lab 04/25/17 1412 04/25/17 1429 04/26/17 0352  WBC 9.8  --  9.5  NEUTROABS 7.0  --  5.3  HGB 15.4* 15.6* 12.1  HCT 44.5 46.0 37.2  MCV 87.6  --  90.3  PLT 321  --  073   Basic Metabolic Panel:  Recent Labs Lab 04/25/17 1412 04/25/17 1429 04/26/17 0352  NA 142 144 143  K 3.2* 3.2* 3.6  CL 104 106 110  CO2  24  --  26  GLUCOSE 117* 120* 97  BUN 11 13 11   CREATININE 0.81 0.80 0.83  CALCIUM 11.1*  --  8.7*   GFR: CrCl cannot be calculated (Unknown ideal weight.). Liver Function Tests:  Recent Labs Lab 04/25/17 1412 04/26/17 0352  AST 25 24  ALT 23 22  ALKPHOS 95 64  BILITOT 0.9 0.7  PROT 8.0 5.9*  ALBUMIN 4.7 3.7    Recent Labs Lab 04/25/17 1412  LIPASE 43   No results for input(s): AMMONIA in the last 168 hours. Coagulation Profile: No results for input(s): INR, PROTIME in the last 168 hours. Cardiac Enzymes:  Recent Labs Lab 04/25/17 2244 04/26/17 0352  TROPONINI <0.03 <0.03   BNP (last 3 results) No results for input(s): PROBNP in the last 8760 hours. HbA1C: No results for input(s): HGBA1C in the last 72 hours. CBG: No  results for input(s): GLUCAP in the last 168 hours. Lipid Profile: No results for input(s): CHOL, HDL, LDLCALC, TRIG, CHOLHDL, LDLDIRECT in the last 72 hours. Thyroid Function Tests: No results for input(s): TSH, T4TOTAL, FREET4, T3FREE, THYROIDAB in the last 72 hours. Anemia Panel: No results for input(s): VITAMINB12, FOLATE, FERRITIN, TIBC, IRON, RETICCTPCT in the last 72 hours. Urine analysis:    Component Value Date/Time   COLORURINE YELLOW 04/26/2017 0352   APPEARANCEUR CLEAR 04/26/2017 0352   LABSPEC >1.046 (H) 04/26/2017 0352   PHURINE 7.0 04/26/2017 0352   GLUCOSEU NEGATIVE 04/26/2017 0352   HGBUR NEGATIVE 04/26/2017 0352   HGBUR negative 07/25/2007 1131   BILIRUBINUR NEGATIVE 04/26/2017 0352   KETONESUR NEGATIVE 04/26/2017 0352   PROTEINUR NEGATIVE 04/26/2017 0352   UROBILINOGEN 1.0 05/27/2009 1500   NITRITE NEGATIVE 04/26/2017 0352   LEUKOCYTESUR NEGATIVE 04/26/2017 0352     )No results found for this or any previous visit (from the past 240 hour(s)).    Anti-infectives    None       Radiology Studies: Ct Abdomen Pelvis W Contrast  Result Date: 04/25/2017 CLINICAL DATA:  Nausea and vomiting since last night. Generalized abdominal pain. History uterine cancer and c difficile colitis. Cholecystectomy. Appendectomy. EXAM: CT ABDOMEN AND PELVIS WITH CONTRAST TECHNIQUE: Multidetector CT imaging of the abdomen and pelvis was performed using the standard protocol following bolus administration of intravenous contrast. CONTRAST:  126mL ISOVUE-300 IOPAMIDOL (ISOVUE-300) INJECTION 61% COMPARISON:  02/27/2017 FINDINGS: Lower chest: Clear lung bases. Normal heart size without pericardial or pleural effusion. Tiny hiatal hernia. Hepatobiliary: Mild hepatic steatosis, without focal liver lesion. Normal gallbladder, without biliary ductal dilatation. Pancreas: Fatty replacement involving the pancreatic head and uncinate process. No duct dilatation or dominant mass. Spleen: Normal in  size, without focal abnormality. Adrenals/Urinary Tract: Normal adrenal glands. Normal kidneys, without hydronephrosis. Normal urinary bladder. Stomach/Bowel: Normal remainder of the stomach. Decompressed colon. Normal terminal ileum. Appendectomy. Proximal small bowel is also collapsed. Given this mild limitation, no evidence of small bowel pathology. Vascular/Lymphatic: Normal caliber of the aorta and branch vessels. No retroperitoneal or retrocrural adenopathy. Pelvic node dissection, without sidewall adenopathy. Reproductive: Hysterectomy.  No adnexal mass. Other: No significant free fluid. No evidence of omental or peritoneal disease. Musculoskeletal: No acute osseous abnormality. IMPRESSION: 1.  No acute process in the abdomen or pelvis. 2. Hepatic steatosis. 3. Hysterectomy, without evidence of metastatic disease. Electronically Signed   By: Abigail Miyamoto M.D.   On: 04/25/2017 16:45   Dg Chest Portable 1 View  Result Date: 04/25/2017 CLINICAL DATA:  Chest pain and tingling beginning last night. Vomiting. EXAM: PORTABLE CHEST  1 VIEW COMPARISON:  PA and lateral chest 02/12/2015. FINDINGS: The lungs are clear. Heart size is normal. No pneumothorax or pleural effusion. No bony abnormality. IMPRESSION: Negative chest. Electronically Signed   By: Inge Rise M.D.   On: 04/25/2017 15:06        Scheduled Meds: . amphetamine-dextroamphetamine  20 mg Oral TID  . metoCLOPramide (REGLAN) injection  5 mg Intravenous Q6H   Continuous Infusions: . sodium chloride 100 mL/hr at 04/25/17 2324     LOS: 0 days    Time spent: 25 min    [place], DO [facility] Pager [phone]  If 7PM-7AM, please contact night-coverage [email or web address] Password [facility] 04/26/2017, 8:41 AM

## 2017-04-26 NOTE — Progress Notes (Signed)
Pt vomited x 1, yellow content, feels better afterwards and nausea med given.

## 2017-04-26 NOTE — Progress Notes (Signed)
   Per patient request, Advanced Directive (AD) documentation left at bedside.  If/when patient decides to move forward with AD, please page on-call chaplain or contact the spiritual care department.  Will follow, as needed.  - Rev. Luckey MDiv ThM

## 2017-04-27 ENCOUNTER — Inpatient Hospital Stay (HOSPITAL_COMMUNITY): Payer: BLUE CROSS/BLUE SHIELD

## 2017-04-27 ENCOUNTER — Other Ambulatory Visit (HOSPITAL_COMMUNITY): Payer: BLUE CROSS/BLUE SHIELD

## 2017-04-27 DIAGNOSIS — E876 Hypokalemia: Secondary | ICD-10-CM

## 2017-04-27 MED ORDER — GI COCKTAIL ~~LOC~~
30.0000 mL | Freq: Two times a day (BID) | ORAL | Status: DC | PRN
  Administered 2017-05-01: 30 mL via ORAL
  Filled 2017-04-27: qty 30

## 2017-04-27 MED ORDER — PANTOPRAZOLE SODIUM 40 MG IV SOLR
40.0000 mg | Freq: Every day | INTRAVENOUS | Status: DC
  Administered 2017-04-27 – 2017-05-01 (×5): 40 mg via INTRAVENOUS
  Filled 2017-04-27 (×5): qty 40

## 2017-04-27 MED ORDER — SODIUM CHLORIDE 0.9 % IV SOLN
INTRAVENOUS | Status: DC
  Administered 2017-04-27 – 2017-04-29 (×6): via INTRAVENOUS

## 2017-04-27 MED ORDER — ENOXAPARIN SODIUM 40 MG/0.4ML ~~LOC~~ SOLN: 40.0000 mg | SUBCUTANEOUS | Status: DC

## 2017-04-27 MED ORDER — ENOXAPARIN SODIUM 40 MG/0.4ML ~~LOC~~ SOLN
40.0000 mg | Freq: Every day | SUBCUTANEOUS | Status: DC
  Administered 2017-04-27 – 2017-05-02 (×6): 40 mg via SUBCUTANEOUS
  Filled 2017-04-27 (×6): qty 0.4

## 2017-04-27 MED ORDER — PROMETHAZINE HCL 25 MG/ML IJ SOLN
12.5000 mg | Freq: Three times a day (TID) | INTRAMUSCULAR | Status: DC | PRN
  Administered 2017-04-27 – 2017-05-02 (×15): 12.5 mg via INTRAVENOUS
  Filled 2017-04-27 (×14): qty 1

## 2017-04-27 MED ORDER — IOPAMIDOL (ISOVUE-370) INJECTION 76%
INTRAVENOUS | Status: AC
  Administered 2017-04-27: 100 mL
  Filled 2017-04-27: qty 100

## 2017-04-27 NOTE — Progress Notes (Signed)
PROGRESS NOTE   [name] Horn  CZY:[id number]    DOB: January 14, 1976    DOA: 04/25/2017  PCP: Thurman Coyer, MD   I have briefly reviewed patients previous medical records in [facility].  Brief Narrative:  41 year old married female, PMH of uterine cancer status post hysterectomy & XRT, DVT now off medications, chronic pain, GERD/? Eosinophilic esophagitis (Dr. Dorrene German, GI in [facility] prior C. difficile 3, gives approximately 1 month history of worsening reflux symptoms, PPI were increased by primary GI, had brief 2 days episodes of intractable nonbloody emesis which spontaneously resolved but followed by chronic intermittent nausea and heartburns. Couple of days prior to admission, developed recurrent nausea, nonbloody emesis, intermittent sharp substernal/epigastric pain radiating to back but no diarrhea. No sick contacts with similar symptoms or long distance travel. CT abdomen and pelvis without acute findings. Treated supportively but has persistent nausea, vomiting and unable to keep anything down. [facility] consulted for assistance on 5/3.  Assessment & Plan:   Principal Problem:   Nausea & vomiting Active Problems:   Chest pain   Nausea and vomiting   1. Intractable nausea, vomiting, abdominal pain: Given his protracted history of almost 4 weeks. Follows with GI/Dr. Dorrene German in [facility]. Reports EGD and "stretching" approximately 3 years back and was told that "eosinophilic esophagitis" could not be ruled out. Gives family history of eosinophilic esophagitis. Ongoing problems with intermittent nausea, vomiting, reflux symptoms for 4 weeks which worsened over the couple days prior to admission. Also reports that since beginning of this year, she has difficulty swallowing and only eats when her husband is at home and had to have "Heimlich's maneuver" done once d/t choking. CT abdomen 5/1: No acute findings. Hepatitic steatosis. Hysterectomy without metastatic disease. Does not seem  like a viral GE. DD: Acute esophagitis/? Eosinophilic esophagitis, gastritis, ulcer disease, stricture versus other etiologies. Add IV PPI. Continue antiemetics and Reglan. [facility] consulted for assistance. May need EGD. Continue IV fluids. Trial of GI cocktail. Lipase normal. 2. Hypokalemia: Replaced 3. Atypical chest pain: Not consistent with ischemic type of chest pain. Low index of suspicion for PE despite negative d-dimer 1 and mildly positive the second time. Not hypoxic or tachycardic. No dyspnea reported. Likely GI related. Patient however did have history of DVT 1 for which she was treated with Coumadin for a year and has been off of it for 5-6 years. Discussed in detail with patient regarding low index of suspicion for PE, treating GI etiology and monitoring. However patient is anxious and states that she wishes to definitively rule out a pulmonary embolism and wants to have CTA chest-risks and benefits provided. 4. History of DVT 1: Completed a year of Coumadin approximately 5-6 years ago. 5. Utrine Cancer, s/p Hysterectomy & XRT: CT abdomen without mets. 6. Prior C Diff 3: Had 1 episode of loose stools on day of admission but none since then. Monitor. 7. ADHD: continue Adderral.    DVT prophylaxis: SCD's. Add Lovenox for DVT prophylaxis. Code Status: Full Family Communication: Discussed in detail with spouse at bedside. Disposition: DC home when medically stable and improved.   Consultants:  [facility]   Procedures:  None  Antimicrobials:  None    Subjective: History detailed as above. Four-week history of worsening reflux symptoms, 2 days history of intractable vomiting which resolved but left her with nausea, dysphagia (5-6 months) and recurrent intractable nausea, vomiting, atypical midsternal chest pain, inability to keep any food or drinks down. No diarrhea.  ROS: No dyspnea or palpitations. No dizziness or lightheadedness.  Objective:  Vitals:   04/26/17 0554  04/26/17 1752 04/26/17 2324 04/27/17 0625  BP: 112/72 117/67 104/67 (!) 157/87  Pulse: (!) 56 (!) 56 65 (!) 55  Resp: 18 16 18 18   Temp: 99.1 F (37.3 C) 98.5 F (36.9 C) 98.9 F (37.2 C) 99.1 F (37.3 C)  TempSrc:   Oral Oral  SpO2: 98% 99% 96% 94%  Height:        Examination:  General exam: Pleasant young female, lying comfortably propped up in bed. Oral mucosa with borderline hydration. Respiratory system: Clear to auscultation. Respiratory effort normal. No reproducible chest wall tenderness. Cardiovascular system: S1 & S2 heard, RRR. No JVD, murmurs, rubs, gallops or clicks. No pedal edema. Telemetry: Sinus bradycardia in the 50s-sinus rhythm. Occasional sinus bradycardia in the 40s, may have been asleep. Gastrointestinal system: Abdomen is nondistended, soft . Epigastric tenderness without rigidity, guarding or rebound. No organomegaly or masses felt. Normal bowel sounds heard. Central nervous system: Alert and oriented. No focal neurological deficits. Extremities: Symmetric 5 x 5 power. Skin: No rashes, lesions or ulcers Psychiatry: Judgement and insight appear normal. Mood & affect appropriate.     Data Reviewed: I have personally reviewed following labs and imaging studies  CBC:  Recent Labs Lab 04/25/17 1412 04/25/17 1429 04/26/17 0352  WBC 9.8  --  9.5  NEUTROABS 7.0  --  5.3  HGB 15.4* 15.6* 12.1  HCT 44.5 46.0 37.2  MCV 87.6  --  90.3  PLT 321  --  710   Basic Metabolic Panel:  Recent Labs Lab 04/25/17 1412 04/25/17 1429 04/26/17 0352  NA 142 144 143  K 3.2* 3.2* 3.6  CL 104 106 110  CO2 24  --  26  GLUCOSE 117* 120* 97  BUN 11 13 11   CREATININE 0.81 0.80 0.83  CALCIUM 11.1*  --  8.7*   Liver Function Tests:  Recent Labs Lab 04/25/17 1412 04/26/17 0352  AST 25 24  ALT 23 22  ALKPHOS 95 64  BILITOT 0.9 0.7  PROT 8.0 5.9*  ALBUMIN 4.7 3.7   Cardiac Enzymes:  Recent Labs Lab 04/25/17 2244 04/26/17 0352 04/26/17 0957  TROPONINI  <0.03 <0.03 <0.03      Radiology Studies: Ct Abdomen Pelvis W Contrast  Result Date: 04/25/2017 CLINICAL DATA:  Nausea and vomiting since last night. Generalized abdominal pain. History uterine cancer and c difficile colitis. Cholecystectomy. Appendectomy. EXAM: CT ABDOMEN AND PELVIS WITH CONTRAST TECHNIQUE: Multidetector CT imaging of the abdomen and pelvis was performed using the standard protocol following bolus administration of intravenous contrast. CONTRAST:  187mL ISOVUE-300 IOPAMIDOL (ISOVUE-300) INJECTION 61% COMPARISON:  02/27/2017 FINDINGS: Lower chest: Clear lung bases. Normal heart size without pericardial or pleural effusion. Tiny hiatal hernia. Hepatobiliary: Mild hepatic steatosis, without focal liver lesion. Normal gallbladder, without biliary ductal dilatation. Pancreas: Fatty replacement involving the pancreatic head and uncinate process. No duct dilatation or dominant mass. Spleen: Normal in size, without focal abnormality. Adrenals/Urinary Tract: Normal adrenal glands. Normal kidneys, without hydronephrosis. Normal urinary bladder. Stomach/Bowel: Normal remainder of the stomach. Decompressed colon. Normal terminal ileum. Appendectomy. Proximal small bowel is also collapsed. Given this mild limitation, no evidence of small bowel pathology. Vascular/Lymphatic: Normal caliber of the aorta and branch vessels. No retroperitoneal or retrocrural adenopathy. Pelvic node dissection, without sidewall adenopathy. Reproductive: Hysterectomy.  No adnexal mass. Other: No significant free fluid. No evidence of omental or peritoneal disease. Musculoskeletal: No acute osseous abnormality. IMPRESSION:  1.  No acute process in the abdomen or pelvis. 2. Hepatic steatosis. 3. Hysterectomy, without evidence of metastatic disease. Electronically Signed   By: Abigail Miyamoto M.D.   On: 04/25/2017 16:45   Dg Chest Portable 1 View  Result Date: 04/25/2017 CLINICAL DATA:  Chest pain and tingling beginning last  night. Vomiting. EXAM: PORTABLE CHEST 1 VIEW COMPARISON:  PA and lateral chest 02/12/2015. FINDINGS: The lungs are clear. Heart size is normal. No pneumothorax or pleural effusion. No bony abnormality. IMPRESSION: Negative chest. Electronically Signed   By: Inge Rise M.D.   On: 04/25/2017 15:06        Scheduled Meds: . amphetamine-dextroamphetamine  20 mg Oral TID  . metoCLOPramide (REGLAN) injection  5 mg Intravenous Q6H   Continuous Infusions: . sodium chloride 100 mL/hr at 04/27/17 1000     LOS: 1 day     Tyanna Hach, MD, FACP, FHM. [facility] Pager [phone]  If 7PM-7AM, please contact night-coverage [email or web address] Password [email or web address] 04/27/2017, 12:03 PM

## 2017-04-27 NOTE — Consult Note (Signed)
[facility] Consultation Note  Referring Provider: Dr. Vernell Leep [facility]) Primary Care Physician:  Thurman Coyer, MD  Reason for Consultation:  Chest pain, dysphagia, abdominal pain  HPI: [name] Horn is a 41 y.o. female whom we've been asked to see for above issues.  Patient has intermittent longstanding GERD symptoms, consisting of retrosternal burning, not responsive to prior PPI trials, and complicated by dysphagia for which patient has seen GI MD in [facility] and had endoscopy with dilatation.  Patient in static state of this health until a few weeks ago, when she had escalating retrosternal burning as well as epigastric pain, no clear alleviating or exacerbating factors.  Patient also with intermittent bursts of stabbing chest pain with radiation to back.  Has had associated nausea, vomiting, diarrhea, 8-lb weight loss.  Cardiopulmonary work-up negative in ED and CT scan abdomen/pelvis was unrevealing.   Past Medical History:  Diagnosis Date  . DVT (deep venous thrombosis) ([place])    x2  . Uterine cancer [facility])     Past Surgical History:  Procedure Laterality Date  . APPENDECTOMY    . CHOLECYSTECTOMY    . KNEE SURGERY     x3  . MINIMALLY INVASIVE FORAMINOTOMY CERVICAL SPINE     C6-T1, Nitka  . TONSILLECTOMY    . TOTAL ABDOMINAL HYSTERECTOMY     Sarcoma, s/p XRT    Prior to Admission medications   Medication Sig Start Date End Date Taking? Authorizing Provider  albuterol (VENTOLIN HFA) 108 (90 BASE) MCG/ACT inhaler Inhale 2 puffs into the lungs every 4 (four) hours as needed for wheezing or shortness of breath.    Yes Historical Provider, MD  amphetamine-dextroamphetamine (ADDERALL) 20 MG tablet Take 20 mg by mouth 3 (three) times daily.   Yes Historical Provider, MD  omeprazole (PRILOSEC) 20 MG capsule Take 20 mg by mouth daily before breakfast. 11/05/10  Yes Historical Provider, MD  oxyCODONE-acetaminophen (PERCOCET/ROXICET) 5-325 MG tablet Take 1-2 tablets  by mouth every 4 (four) hours as needed for moderate pain or severe pain. 12/25/15  Yes Clayton Bibles, PA-C  amoxicillin (AMOXIL) 500 MG capsule Take 1 capsule (500 mg total) by mouth 3 (three) times daily. Patient not taking: Reported on 04/25/2017 11/11/15   Delsa Grana, PA-C  cyclobenzaprine (FLEXERIL) 10 MG tablet Take 1 tablet (10 mg total) by mouth 3 (three) times daily as needed for muscle spasms. Patient not taking: Reported on 04/25/2017 12/25/13   Shanon Rosser, MD  metroNIDAZOLE (FLAGYL) 500 MG tablet Take 1 tablet (500 mg total) by mouth 2 (two) times daily. Patient not taking: Reported on 04/25/2017 11/11/15   Delsa Grana, PA-C    Current Facility-Administered Medications  Medication Dose Route Frequency Provider Last Rate Last Dose  . 0.9 %  sodium chloride infusion   Intravenous Continuous Modena Jansky, MD 100 mL/hr at 04/27/17 1000    . acetaminophen (TYLENOL) tablet 650 mg  650 mg Oral Q6H PRN [name] Patience, MD       Or  . acetaminophen (TYLENOL) suppository 650 mg  650 mg Rectal Q6H PRN [name] Patience, MD      . albuterol (PROVENTIL) (2.5 MG/3ML) 0.083% nebulizer solution 3 mL  3 mL Inhalation Q4H PRN [name] Patience, MD      . amphetamine-dextroamphetamine (ADDERALL) tablet 20 mg  20 mg Oral TID [name] Patience, MD      . HYDROmorphone (DILAUDID) injection 0.25 mg  0.25 mg Intravenous Q4H PRN [name] Patience, MD   0.25  mg at 04/27/17 0955  . metoCLOPramide (REGLAN) injection 5 mg  5 mg Intravenous Q6H Jessica U Vann, DO   5 mg at 04/27/17 0541  . ondansetron (ZOFRAN) tablet 4 mg  4 mg Oral Q6H PRN [name] Patience, MD       Or  . ondansetron [facility]) injection 4 mg  4 mg Intravenous Q6H PRN [name] Patience, MD   4 mg at 04/27/17 0955  . promethazine (PHENERGAN) injection 12.5 mg  12.5 mg Intravenous Q8H PRN Geradine Girt, DO   12.5 mg at 04/27/17 0737    Allergies as of 04/25/2017 - Review Complete 04/25/2017  Allergen Reaction Noted  . Sulfa  antibiotics Anaphylaxis, Shortness Of Breath, Swelling, and Rash 08/26/2011  . Sulfonamide derivatives Hives, Shortness Of Breath, and Swelling   . Benadryl [diphenhydramine hcl] Other (See Comments) 10/27/2012    Family History  Problem Relation Age of Onset  . Hypertension    . Thyroid disease    . Hyperlipidemia    . Colon cancer      grandmother    Social History   Social History  . Marital status: Married    Spouse name: N/A  . Number of children: N/A  . Years of education: N/A   Occupational History  . Not on file.   Social History Main Topics  . Smoking status: Never Smoker  . Smokeless tobacco: Never Used  . Alcohol use Yes  . Drug use: No  . Sexual activity: Not on file   Other Topics Concern  . Not on file   Social History Narrative  . No narrative on file    Review of Systems: Positive = bold Gen: Denies any fever, chills, rigors, night sweats, anorexia, fatigue, weakness, malaise, involuntary weight loss, and sleep disorder CV: Denies chest pain, angina, palpitations, syncope, orthopnea, PND, peripheral edema, and claudication. Resp: Denies dyspnea, cough, sputum, wheezing, coughing up blood. GI: Described in detail in HPI.    GU : Denies urinary burning, blood in urine, urinary frequency, urinary hesitancy, nocturnal urination, and urinary incontinence. MS: Denies joint pain or swelling.  Denies muscle weakness, cramps, atrophy.  Derm: Denies rash, itching, oral ulcerations, hives, unhealing ulcers.  Psych: Denies depression, anxiety, memory loss, suicidal ideation, hallucinations,  and confusion. Heme: Denies bruising, bleeding, and enlarged lymph nodes. Neuro:  Denies any headaches, dizziness, paresthesias. Endo:  Denies any problems with DM, thyroid, adrenal function.  Physical Exam: Vital signs in last 24 hours: Temp:  [98.5 F (36.9 C)-99.1 F (37.3 C)] 99.1 F (37.3 C) (05/03 0625) Pulse Rate:  [55-65] 55 (05/03 0625) Resp:  [16-18] 18  (05/03 0625) BP: (104-157)/(67-87) 157/87 (05/03 0625) SpO2:  [94 %-99 %] 94 % (05/03 0625) Last BM Date: 04/25/17 General:   Alert,  Well-developed, well-nourished, pleasant and cooperative in NAD Head:  Normocephalic and atraumatic. Eyes:  Sclera clear, no icterus.   Conjunctiva pink. Ears:  Normal auditory acuity. Nose:  No deformity, discharge,  or lesions. Mouth:  No deformity or lesions.  Oropharynx pink but dry Neck:  Supple; no masses or thyromegaly. Lungs:  Clear throughout to auscultation.   No wheezes, crackles, or rhonchi. No acute distress. Heart:  Regular rate and rhythm; no murmurs, clicks, rubs,  or gallops. Abdomen:  Soft, mild epigastric tenderness without peritonitis; nondistended. No masses, hepatosplenomegaly or hernias noted. Normal bowel sounds, without guarding, and without rebound.     Msk:  Symmetrical without gross deformities. Normal posture. Pulses:  Normal pulses noted. Extremities:  Without clubbing or edema. Neurologic:  Alert and  oriented x4;  grossly normal neurologically. Skin:  Intact without significant lesions or rashes. Psych:  Alert and cooperative. Depressed mood, flat affect  Lab Results:  Recent Labs  04/25/17 1412 04/25/17 1429 04/26/17 0352  WBC 9.8  --  9.5  HGB 15.4* 15.6* 12.1  HCT 44.5 46.0 37.2  PLT 321  --  250   BMET  Recent Labs  04/25/17 1412 04/25/17 1429 04/26/17 0352  NA 142 144 143  K 3.2* 3.2* 3.6  CL 104 106 110  CO2 24  --  26  GLUCOSE 117* 120* 97  BUN 11 13 11   CREATININE 0.81 0.80 0.83  CALCIUM 11.1*  --  8.7*   LFT  Recent Labs  04/26/17 0352  PROT 5.9*  ALBUMIN 3.7  AST 24  ALT 22  ALKPHOS 64  BILITOT 0.7   PT/INR No results for input(s): LABPROT, INR in the last 72 hours.  Studies/Results: Ct Abdomen Pelvis W Contrast  Result Date: 04/25/2017 CLINICAL DATA:  Nausea and vomiting since last night. Generalized abdominal pain. History uterine cancer and c difficile colitis.  Cholecystectomy. Appendectomy. EXAM: CT ABDOMEN AND PELVIS WITH CONTRAST TECHNIQUE: Multidetector CT imaging of the abdomen and pelvis was performed using the standard protocol following bolus administration of intravenous contrast. CONTRAST:  15mL ISOVUE-300 IOPAMIDOL (ISOVUE-300) INJECTION 61% COMPARISON:  02/27/2017 FINDINGS: Lower chest: Clear lung bases. Normal heart size without pericardial or pleural effusion. Tiny hiatal hernia. Hepatobiliary: Mild hepatic steatosis, without focal liver lesion. Normal gallbladder, without biliary ductal dilatation. Pancreas: Fatty replacement involving the pancreatic head and uncinate process. No duct dilatation or dominant mass. Spleen: Normal in size, without focal abnormality. Adrenals/Urinary Tract: Normal adrenal glands. Normal kidneys, without hydronephrosis. Normal urinary bladder. Stomach/Bowel: Normal remainder of the stomach. Decompressed colon. Normal terminal ileum. Appendectomy. Proximal small bowel is also collapsed. Given this mild limitation, no evidence of small bowel pathology. Vascular/Lymphatic: Normal caliber of the aorta and branch vessels. No retroperitoneal or retrocrural adenopathy. Pelvic node dissection, without sidewall adenopathy. Reproductive: Hysterectomy.  No adnexal mass. Other: No significant free fluid. No evidence of omental or peritoneal disease. Musculoskeletal: No acute osseous abnormality. IMPRESSION: 1.  No acute process in the abdomen or pelvis. 2. Hepatic steatosis. 3. Hysterectomy, without evidence of metastatic disease. Electronically Signed   By: Abigail Miyamoto M.D.   On: 04/25/2017 16:45   Dg Chest Portable 1 View  Result Date: 04/25/2017 CLINICAL DATA:  Chest pain and tingling beginning last night. Vomiting. EXAM: PORTABLE CHEST 1 VIEW COMPARISON:  PA and lateral chest 02/12/2015. FINDINGS: The lungs are clear. Heart size is normal. No pneumothorax or pleural effusion. No bony abnormality. IMPRESSION: Negative chest.  Electronically Signed   By: Inge [name] M.D.   On: 04/25/2017 15:06   Impression:  1.  Chest pain and abdominal pain.  Cardiopulmonary work-up in ED negative.  Could be GERD with esophagogastritis.  Post-cholecystectomy excludes gallbladder and normal LFTs makes likelihood of hepatobiliary process low.  Peptic ulcer disease possible.  There is family history of eosinophilic gastroenteritis so that is a consideration as well. 2.  Dysphagia.   History of esophageal dilatation a few years ago.  Family history of eosinophilic gastroenteritis. 3.  Nausea and vomiting. 4.  Diarrhea, improving.  History of N/V and diarrhea seem a bit too protracted (2 weeks) for infectious process.  Plan:  1.  Stool studies in process. 2.  PPI + sucralfate suspension. 3.  Barium swallow to  assess dysphagia and evaluate for esophageal ulceration(s). 4.  May need endoscopy this admission. 5.  Clear liquid diet today is ok, npo after midnight for anticipated barium swallow tomorrow. 6.  [facility] will follow.   LOS: 1 day   Arslan Kier M  04/27/2017, 11:16 AM  Pager [phone] If no answer or after 5 PM call [phone]

## 2017-04-28 ENCOUNTER — Inpatient Hospital Stay (HOSPITAL_COMMUNITY): Payer: BLUE CROSS/BLUE SHIELD

## 2017-04-28 DIAGNOSIS — R072 Precordial pain: Secondary | ICD-10-CM

## 2017-04-28 LAB — ECHOCARDIOGRAM COMPLETE
Height: 64 in
WEIGHTICAEL: 2553.81 [oz_av]

## 2017-04-28 MED ORDER — ONDANSETRON HCL 4 MG/2ML IJ SOLN
4.0000 mg | Freq: Once | INTRAMUSCULAR | Status: AC
  Administered 2017-04-28: 4 mg via INTRAVENOUS
  Filled 2017-04-28: qty 2

## 2017-04-28 MED ORDER — SUCRALFATE 1 GM/10ML PO SUSP
1.0000 g | Freq: Three times a day (TID) | ORAL | Status: DC
Start: 2017-04-28 — End: 2017-04-29
  Filled 2017-04-28 (×2): qty 10

## 2017-04-28 NOTE — Progress Notes (Signed)
PROGRESS NOTE   [name] Horn  TGG:[id number]    DOB: 08/21/1976    DOA: 04/25/2017  PCP: Thurman Coyer, MD   I have briefly reviewed patients previous medical records in [facility].  Brief Narrative:  41 year old married female, PMH of uterine cancer status post hysterectomy & XRT, DVT now off medications, chronic pain, GERD/? Eosinophilic esophagitis (Dr. Dorrene German, GI in [facility]), prior C. difficile 3, gives approximately 1 month history of worsening reflux symptoms, PPI were increased by primary GI, had brief 2 days episodes of intractable nonbloody emesis which spontaneously resolved but followed by chronic intermittent nausea and heartburns. Couple of days prior to admission, developed recurrent nausea, nonbloody emesis, intermittent sharp substernal/epigastric pain radiating to back but no diarrhea. No sick contacts with similar symptoms or long distance travel. CT abdomen and pelvis without acute findings. Treated supportively but has persistent nausea, vomiting and unable to keep anything down. [facility] consulted for assistance on 5/3.  Assessment & Plan:   Principal Problem:   Nausea & vomiting Active Problems:   Chest pain   Nausea and vomiting   1. Intractable nausea, vomiting, abdominal pain: Gives protracted history of almost 4 weeks. Follows with GI/Dr. Dorrene German in [facility]. Reports EGD and "stretching" approximately 3 years back and was told that "eosinophilic esophagitis" could not be ruled out. Gives family history of eosinophilic Gastroenteritis. Ongoing problems with intermittent nausea, vomiting, reflux symptoms for 4 weeks which worsened over the couple days prior to admission. Also reports that since beginning of this year, she has difficulty swallowing and only eats when her husband is at home and had to have "Heimlich's maneuver" done once d/t choking. CT abdomen 5/1: No acute findings. Hepatitic steatosis. Hysterectomy without metastatic disease. Does not seem  like a viral GE. DD: Acute esophagitis/? Eosinophilic esophagitis, gastritis, ulcer disease, stricture versus other etiologies. Added IV PPI. Continue antiemetics and Reglan. Lipase normal. [facility] consultation and follow-up appreciated. Continue PPI and sucralfate added. Scheduled for barium swallow this afternoon and may well need EGD pending barium swallow results. 2. Hypokalemia: Replaced 3. Atypical chest pain: Not consistent with ischemic type of chest pain. Low index of suspicion for PE despite negative d-dimer 1 and mildly positive the second time. Not hypoxic or tachycardic. No dyspnea reported. Likely GI related. Patient however did have history of DVT 1 for which she was treated with Coumadin for a year and has been off of it for 5-6 years. Discussed in detail with patient regarding low index of suspicion for PE, treating GI etiology and monitoring. However patient was anxious and stated that she wished to definitively rule out a pulmonary embolism and wanted to have CTA chest >no PE or acute findings. Follow 2-D echo results. 4. History of DVT 1: Completed a year of Coumadin approximately 5-6 years ago. 5. Utrine Cancer, s/p Hysterectomy & XRT: CT abdomen without mets. 6. Prior C Diff 3: Had 1 episode of loose stools on day of admission but none since then. No reported diarrhea during this admission. 7. ADHD: continue Adderral.    DVT prophylaxis: SCD's. Add Lovenox for DVT prophylaxis. Code Status: Full Family Communication: None at bedside Disposition: DC home when medically stable and improved.   Consultants:  [facility]   Procedures:  None  Antimicrobials:  None    Subjective: Ongoing nausea. No vomiting since 11 PM last night. Ongoing epigastric/lower substernal pain radiating to back, made worse during episodes of nausea or dry heaving.  ROS: No dyspnea  or palpitations. No dizziness or lightheadedness.  Objective:  Vitals:   04/27/17 1532 04/27/17 2256 04/28/17  0200 04/28/17 0530  BP: (!) 147/94 (!) 153/75  112/66  Pulse: (!) 56 (!) 57  (!) 58  Resp: 19     Temp: 99.8 F (37.7 C) 99.9 F (37.7 C)  98.2 F (36.8 C)  TempSrc: Oral Oral  Oral  SpO2: 97% (!) 89%  98%  Weight:   72.4 kg (159 lb 9.8 oz)   Height:   5\' 4"  (1.626 m)     Examination:  General exam: Pleasant young female, lying comfortably propped up in bed. Oral mucosa with borderline hydration. Respiratory system: Clear to auscultation. Respiratory effort normal. No reproducible chest wall tenderness. Cardiovascular system: S1 & S2 heard, RRR. No JVD, murmurs, rubs, gallops or clicks. No pedal edema. Telemetry: Sinus bradycardia in the 50s-sinus rhythm.  Gastrointestinal system: Abdomen is nondistended, soft . Epigastric tenderness without rigidity, guarding or rebound. No organomegaly or masses felt. Normal bowel sounds heard. Central nervous system: Alert and oriented. No focal neurological deficits. Extremities: Symmetric 5 x 5 power. Skin: No rashes, lesions or ulcers Psychiatry: Judgement and insight appear normal. Mood & affect appropriate.     Data Reviewed: I have personally reviewed following labs and imaging studies  CBC:  Recent Labs Lab 04/25/17 1412 04/25/17 1429 04/26/17 0352  WBC 9.8  --  9.5  NEUTROABS 7.0  --  5.3  HGB 15.4* 15.6* 12.1  HCT 44.5 46.0 37.2  MCV 87.6  --  90.3  PLT 321  --  983   Basic Metabolic Panel:  Recent Labs Lab 04/25/17 1412 04/25/17 1429 04/26/17 0352  NA 142 144 143  K 3.2* 3.2* 3.6  CL 104 106 110  CO2 24  --  26  GLUCOSE 117* 120* 97  BUN 11 13 11   CREATININE 0.81 0.80 0.83  CALCIUM 11.1*  --  8.7*   Liver Function Tests:  Recent Labs Lab 04/25/17 1412 04/26/17 0352  AST 25 24  ALT 23 22  ALKPHOS 95 64  BILITOT 0.9 0.7  PROT 8.0 5.9*  ALBUMIN 4.7 3.7   Cardiac Enzymes:  Recent Labs Lab 04/25/17 2244 04/26/17 0352 04/26/17 0957  TROPONINI <0.03 <0.03 <0.03      Radiology Studies: Ct Angio  Chest Pe W Or Wo Contrast  Result Date: 04/27/2017 CLINICAL DATA:  Uterine cancer, prior calf DVT. Atypical chest pain with mildly elevated D-dimer. EXAM: CT ANGIOGRAPHY CHEST WITH CONTRAST TECHNIQUE: Multidetector CT imaging of the chest was performed using the standard protocol during bolus administration of intravenous contrast. Multiplanar CT image reconstructions and MIPs were obtained to evaluate the vascular anatomy. CONTRAST:  100 cc Isovue 370 IV COMPARISON:  CXR from 04/25/2017 and chest CT 10/10/2010 FINDINGS: Cardiovascular: The study is of quality for the evaluation of pulmonary embolism. There are no filling defects in the central, lobar, segmental or subsegmental pulmonary artery branches to suggest acute pulmonary embolism. Great vessels are normal in course and caliber. Normal heart size. No significant pericardial fluid/thickening. Mediastinum/Nodes: No discrete thyroid nodules. Unremarkable esophagus. No pathologically enlarged axillary, mediastinal or hilar lymph nodes. Lungs/Pleura: No pneumothorax. No pleural effusion. Previously noted left lower lobe 5 mm nodule is smaller and 4 mm currently. The nodular density in the periphery of the right lower lobe appears to represent a branch point for pulmonary vessel. No additional nodules or masses. No pneumonic consolidations. Upper abdomen: No acute abnormality Musculoskeletal:  No aggressive appearing focal osseous lesions.  Review of the MIP images confirms the above findings. IMPRESSION: 1. No acute pulmonary consolidation, aortic aneurysm nor dissection. 2. 4 mm smaller left lower lobe nodular density versus 5 mm previously in 2011 consistent with a benign finding. 3. No acute pneumonic consolidation, CHF, effusion or pneumothorax. Electronically Signed   By: Ashley Royalty M.D.   On: 04/27/2017 13:58        Scheduled Meds: . amphetamine-dextroamphetamine  20 mg Oral TID  . enoxaparin (LOVENOX) injection  40 mg Subcutaneous Daily  .  metoCLOPramide (REGLAN) injection  5 mg Intravenous Q6H  . pantoprazole (PROTONIX) IV  40 mg Intravenous Daily  . sucralfate  1 g Oral TID WC & HS   Continuous Infusions: . sodium chloride 100 mL/hr at 04/28/17 0514     LOS: 2 days     Clifton Kovacic, MD, FACP, FHM. [facility] Pager [phone]  If 7PM-7AM, please contact night-coverage [email or web address] Password [email or web address] 04/28/2017, 2:16 PM

## 2017-04-28 NOTE — Progress Notes (Signed)
Barium swallow ordered and pending.  Continue sucralfate and PPI for now.  [facility] will revisit tomorrow; patient may very well end up needing endoscopy during this admission, pending barium swallow results.

## 2017-04-28 NOTE — Progress Notes (Signed)
D/c telemetry per MD order. Modena Morrow E, RN 04/28/2017 3:06 PM

## 2017-04-29 LAB — C DIFFICILE QUICK SCREEN W PCR REFLEX
C DIFFICILE (CDIFF) INTERP: NOT DETECTED
C Diff antigen: NEGATIVE
C Diff toxin: NEGATIVE

## 2017-04-29 LAB — BASIC METABOLIC PANEL
ANION GAP: 14 (ref 5–15)
BUN: 12 mg/dL (ref 6–20)
CALCIUM: 8.7 mg/dL — AB (ref 8.9–10.3)
CO2: 22 mmol/L (ref 22–32)
CREATININE: 0.86 mg/dL (ref 0.44–1.00)
Chloride: 102 mmol/L (ref 101–111)
GLUCOSE: 71 mg/dL (ref 65–99)
Potassium: 3.2 mmol/L — ABNORMAL LOW (ref 3.5–5.1)
Sodium: 138 mmol/L (ref 135–145)

## 2017-04-29 MED ORDER — SODIUM CHLORIDE 0.9 % IV SOLN
INTRAVENOUS | Status: DC
  Administered 2017-04-29: 16:00:00 via INTRAVENOUS
  Filled 2017-04-29 (×3): qty 1000

## 2017-04-29 MED ORDER — POTASSIUM CHLORIDE CRYS ER 20 MEQ PO TBCR
40.0000 meq | EXTENDED_RELEASE_TABLET | Freq: Once | ORAL | Status: DC
  Filled 2017-04-29: qty 2

## 2017-04-29 MED ORDER — SODIUM CHLORIDE 0.9 % IV SOLN: INTRAVENOUS | Status: DC

## 2017-04-29 MED ORDER — POTASSIUM CHLORIDE 10 MEQ/100ML IV SOLN
10.0000 meq | INTRAVENOUS | Status: AC
  Administered 2017-04-29 (×4): 10 meq via INTRAVENOUS
  Filled 2017-04-29 (×4): qty 100

## 2017-04-29 NOTE — Progress Notes (Signed)
[facility] Progress Note  Subjective: Patient still having nausea vomiting chest upper abdominal pain and dysphagia.  Objective: Vital signs in last 24 hours: Temp:  [98.3 F (36.8 C)-98.7 F (37.1 C)] 98.3 F (36.8 C) (05/05 0554) Pulse Rate:  [58-70] 61 (05/05 0554) Resp:  [18] 18 (05/05 0554) BP: (109-117)/(65-75) 109/65 (05/05 0554) SpO2:  [96 %-98 %] 97 % (05/05 0554) Weight change:    PE: Unchanged  Lab Results: Results for orders placed or performed during the hospital encounter of 04/25/17 (from the past 24 hour(s))  C difficile quick scan w PCR reflex     Status: None   Collection Time: 04/28/17 11:57 PM  Result Value Ref Range   C Diff antigen NEGATIVE NEGATIVE   C Diff toxin NEGATIVE NEGATIVE   C Diff interpretation No C. difficile detected.   Basic metabolic panel     Status: Abnormal   Collection Time: 04/29/17  5:32 AM  Result Value Ref Range   Sodium 138 135 - 145 mmol/L   Potassium 3.2 (L) 3.5 - 5.1 mmol/L   Chloride 102 101 - 111 mmol/L   CO2 22 22 - 32 mmol/L   Glucose, Bld 71 65 - 99 mg/dL   BUN 12 6 - 20 mg/dL   Creatinine, Ser 0.86 0.44 - 1.00 mg/dL   Calcium 8.7 (L) 8.9 - 10.3 mg/dL   GFR calc non Af Amer >60 >60 mL/min   GFR calc Af Amer >60 >60 mL/min   Anion gap 14 5 - 15    Studies/Results: Ct Angio Chest Pe W Or Wo Contrast  Result Date: 04/27/2017 CLINICAL DATA:  Uterine cancer, prior calf DVT. Atypical chest pain with mildly elevated D-dimer. EXAM: CT ANGIOGRAPHY CHEST WITH CONTRAST TECHNIQUE: Multidetector CT imaging of the chest was performed using the standard protocol during bolus administration of intravenous contrast. Multiplanar CT image reconstructions and MIPs were obtained to evaluate the vascular anatomy. CONTRAST:  100 cc Isovue 370 IV COMPARISON:  CXR from 04/25/2017 and chest CT 10/10/2010 FINDINGS: Cardiovascular: The study is of quality for the evaluation of pulmonary embolism. There are no filling defects in the  central, lobar, segmental or subsegmental pulmonary artery branches to suggest acute pulmonary embolism. Great vessels are normal in course and caliber. Normal heart size. No significant pericardial fluid/thickening. Mediastinum/Nodes: No discrete thyroid nodules. Unremarkable esophagus. No pathologically enlarged axillary, mediastinal or hilar lymph nodes. Lungs/Pleura: No pneumothorax. No pleural effusion. Previously noted left lower lobe 5 mm nodule is smaller and 4 mm currently. The nodular density in the periphery of the right lower lobe appears to represent a branch point for pulmonary vessel. No additional nodules or masses. No pneumonic consolidations. Upper abdomen: No acute abnormality Musculoskeletal:  No aggressive appearing focal osseous lesions. Review of the MIP images confirms the above findings. IMPRESSION: 1. No acute pulmonary consolidation, aortic aneurysm nor dissection. 2. 4 mm smaller left lower lobe nodular density versus 5 mm previously in 2011 consistent with a benign finding. 3. No acute pneumonic consolidation, CHF, effusion or pneumothorax. Electronically Signed   By: Ashley Royalty M.D.   On: 04/27/2017 13:58   Dg Duanne Limerick  W/kub  Result Date: 04/28/2017 CLINICAL DATA:  Chest pain, dysphagia, nausea, vomiting, epigastric pain. History of reflux. EXAM: UPPER GI SERIES WITH KUB TECHNIQUE: After obtaining a scout radiograph a routine upper GI series was performed using thin and high density barium. FLUOROSCOPY TIME:  Fluoroscopy Time:  2 minutes Radiation Exposure Index (if provided by the fluoroscopic device):  Number of Acquired Spot Images: 0 COMPARISON:  CT 04/25/2017 FINDINGS: Scout film demonstrates changes of prior cholecystectomy. No evidence of bowel obstruction organomegaly. Fluoroscopic evaluation of swallowing demonstrates normal esophageal peristalsis. No fixed stricture, fold thickening or mass. No reflux with the water siphon maneuver. Small hiatal hernia. The patient swallowed a  13 mm barium tablet which freely passed into the stomach. Stomach, duodenal bulb and duodenal sweep are unremarkable. No ulceration, fold thickening or mass. IMPRESSION: Small hiatal hernia.  Otherwise unremarkable study. Electronically Signed   By: Rolm Baptise M.D.   On: 04/28/2017 16:38      Assessment: 1. Nausea vomiting dysphagia upper abdominal pain fairly suspicious for eosinophilic esophagitis plus minus gastroenteritis  Plan: Will plan EGD tomorrow and unless obvious stricture with no visible signs of eosinophilic esophagitis, we'll biopsy for the latter as well as the stomach and duodenum. Otherwise we'll perform esophageal dilatation.    Tasheba Henson C 04/29/2017, 8:21 AM  Pager [phone] If no answer or after 5 PM call [phone]

## 2017-04-29 NOTE — Progress Notes (Signed)
PROGRESS NOTE   [name] Horn  HER:[id number]    DOB: 02-11-1976    DOA: 04/25/2017  PCP: Thurman Coyer, MD   I have briefly reviewed patients previous medical records in [facility].  Brief Narrative:  41 year old married female, PMH of uterine cancer status post hysterectomy & XRT, DVT now off medications, chronic pain, GERD/? Eosinophilic esophagitis (Dr. Dorrene German, GI in [facility]), prior C. difficile 3, gives approximately 1 month history of worsening reflux symptoms, PPI were increased by primary GI, had brief 2 days episodes of intractable nonbloody emesis which spontaneously resolved but followed by chronic intermittent nausea and heartburns. Couple of days prior to admission, developed recurrent nausea, nonbloody emesis, intermittent sharp substernal/epigastric pain radiating to back but no diarrhea. No sick contacts with similar symptoms or long distance travel. CT abdomen and pelvis without acute findings. Treated supportively but has persistent nausea, vomiting and unable to keep anything down. [facility] consulted for assistance on 5/3.Barium swallow: Small hiatal hernia. Ongoing symptoms. EGD planned for 5/6.  Assessment & Plan:   Principal Problem:   Nausea & vomiting Active Problems:   Chest pain   Nausea and vomiting   1. Intractable nausea, vomiting, abdominal pain: Gives protracted history of almost 4 weeks. Follows with GI/Dr. Dorrene German in [facility]. Reports EGD and "stretching" approximately 3 years back and was told that "eosinophilic esophagitis" could not be ruled out. Gives family history of eosinophilic Gastroenteritis. Ongoing problems with intermittent nausea, vomiting, reflux symptoms for 4 weeks which worsened over the couple days prior to admission. Also reports that since beginning of this year, she has difficulty swallowing and only eats when her husband is at home and had to have "Heimlich's maneuver" done once d/t choking. CT abdomen 5/1: No acute findings.  Hepatitic steatosis. Hysterectomy without metastatic disease. Does not seem like a viral GE. DD: Acute esophagitis/? Eosinophilic esophagitis, gastritis, ulcer disease, stricture versus other etiologies. Added IV PPI. Continue antiemetics and Reglan. Lipase normal. [facility] consultation and follow-up appreciated. Continue PPI and sucralfate added. Barium swallow: Small hiatal hernia. Ongoing symptoms. EGD planned for 5/6. 2. Hypokalemia: Replace IV and follow. 3. Atypical chest pain: Not consistent with ischemic type of chest pain. Low index of suspicion for PE despite negative d-dimer 1 and mildly positive the second time. Not hypoxic or tachycardic. No dyspnea reported. Likely GI related. Patient however did have history of DVT 1 for which she was treated with Coumadin for a year and has been off of it for 5-6 years. Discussed in detail with patient regarding low index of suspicion for PE, treating GI etiology and monitoring. However patient was anxious and stated that she wished to definitively rule out a pulmonary embolism and wanted to have CTA chest >no PE or acute findings. 2-D echo results: Normal EF-details below. Improved. 4. History of DVT 1: Completed a year of Coumadin approximately 5-6 years ago. 5. Utrine Cancer, s/p Hysterectomy & XRT: CT abdomen without mets. 6. Prior C Diff 3/diarrhea: Had 2 episodes of diarrhea on 5/4. C. difficile negative. 7. ADHD: continue Adderral.    DVT prophylaxis: SCD's. Add Lovenox for DVT prophylaxis. Code Status: Full Family Communication: None at bedside Disposition: DC home when medically stable and improved.   Consultants:  [facility]   Procedures:  None  Antimicrobials:  None    Subjective: Ongoing nausea-unable/reluctant to take by mouth for fear of vomiting. No vomiting for the last 36 hours. 2 episodes of diarrhea last night but none this  morning. Mild lower substernal/epigastric worsened with nausea or dry heaves.  ROS: No dyspnea  or palpitations. No dizziness or lightheadedness.  Objective:  Vitals:   04/28/17 1506 04/28/17 2152 04/29/17 0554 04/29/17 1409  BP: 117/75 117/70 109/65 118/68  Pulse: 70 (!) 58 61 63  Resp: 18 18 18 17   Temp: 98.7 F (37.1 C) 98.7 F (37.1 C) 98.3 F (36.8 C) 98.6 F (37 C)  TempSrc: Oral   Oral  SpO2: 98% 96% 97% 98%  Weight:      Height:        Examination:  General exam: Pleasant young female, lying comfortably propped up in bed. Oral mucosa moist. Respiratory system: Clear to auscultation. Respiratory effort normal. No reproducible chest wall tenderness. Cardiovascular system: S1 & S2 heard, RRR. No JVD, murmurs, rubs, gallops or clicks. No pedal edema. Telemetry: Sinus bradycardia in the 50s-sinus rhythm.  Gastrointestinal system: Abdomen is nondistended, soft . No tenderness appreciated. No organomegaly or masses felt. Normal bowel sounds heard. Central nervous system: Alert and oriented. No focal neurological deficits. Extremities: Symmetric 5 x 5 power. Skin: No rashes, lesions or ulcers Psychiatry: Judgement and insight appear normal. Mood & affect appropriate.     Data Reviewed: I have personally reviewed following labs and imaging studies  CBC:  Recent Labs Lab 04/25/17 1412 04/25/17 1429 04/26/17 0352  WBC 9.8  --  9.5  NEUTROABS 7.0  --  5.3  HGB 15.4* 15.6* 12.1  HCT 44.5 46.0 37.2  MCV 87.6  --  90.3  PLT 321  --  387   Basic Metabolic Panel:  Recent Labs Lab 04/25/17 1412 04/25/17 1429 04/26/17 0352 04/29/17 0532  NA 142 144 143 138  K 3.2* 3.2* 3.6 3.2*  CL 104 106 110 102  CO2 24  --  26 22  GLUCOSE 117* 120* 97 71  BUN 11 13 11 12   CREATININE 0.81 0.80 0.83 0.86  CALCIUM 11.1*  --  8.7* 8.7*   Liver Function Tests:  Recent Labs Lab 04/25/17 1412 04/26/17 0352  AST 25 24  ALT 23 22  ALKPHOS 95 64  BILITOT 0.9 0.7  PROT 8.0 5.9*  ALBUMIN 4.7 3.7   Cardiac Enzymes:  Recent Labs Lab 04/25/17 2244 04/26/17 0352  04/26/17 0957  TROPONINI <0.03 <0.03 <0.03      Radiology Studies: Darletta Moll Ugi  W/kub  Result Date: 04/28/2017 CLINICAL DATA:  Chest pain, dysphagia, nausea, vomiting, epigastric pain. History of reflux. EXAM: UPPER GI SERIES WITH KUB TECHNIQUE: After obtaining a scout radiograph a routine upper GI series was performed using thin and high density barium. FLUOROSCOPY TIME:  Fluoroscopy Time:  2 minutes Radiation Exposure Index (if provided by the fluoroscopic device): Number of Acquired Spot Images: 0 COMPARISON:  CT 04/25/2017 FINDINGS: Scout film demonstrates changes of prior cholecystectomy. No evidence of bowel obstruction organomegaly. Fluoroscopic evaluation of swallowing demonstrates normal esophageal peristalsis. No fixed stricture, fold thickening or mass. No reflux with the water siphon maneuver. Small hiatal hernia. The patient swallowed a 13 mm barium tablet which freely passed into the stomach. Stomach, duodenal bulb and duodenal sweep are unremarkable. No ulceration, fold thickening or mass. IMPRESSION: Small hiatal hernia.  Otherwise unremarkable study. Electronically Signed   By: Rolm Baptise M.D.   On: 04/28/2017 16:38        Scheduled Meds: . amphetamine-dextroamphetamine  20 mg Oral TID  . enoxaparin (LOVENOX) injection  40 mg Subcutaneous Daily  . metoCLOPramide (REGLAN) injection  5 mg Intravenous Q6H  .  pantoprazole (PROTONIX) IV  40 mg Intravenous Daily   Continuous Infusions: . sodium chloride 100 mL/hr at 04/29/17 1103     LOS: 3 days     Zareya Tuckett, MD, FACP, FHM. [facility] Pager [phone]  If 7PM-7AM, please contact night-coverage [email or web address] Password [email or web address] 04/29/2017, 2:17 PM

## 2017-04-30 ENCOUNTER — Inpatient Hospital Stay (HOSPITAL_COMMUNITY): Payer: BLUE CROSS/BLUE SHIELD | Admitting: Anesthesiology

## 2017-04-30 ENCOUNTER — Encounter (HOSPITAL_COMMUNITY)
Admission: EM | Disposition: A | Discharge status: Home / Self Care | Payer: Self-pay | Source: Home / Self Care | Attending: Internal Medicine

## 2017-04-30 ENCOUNTER — Encounter (HOSPITAL_COMMUNITY): Payer: Self-pay

## 2017-04-30 HISTORY — PX: ESOPHAGOGASTRODUODENOSCOPY: SHX5428

## 2017-04-30 HISTORY — PX: SAVORY DILATION: SHX5439

## 2017-04-30 LAB — BASIC METABOLIC PANEL
Anion gap: 10 (ref 5–15)
BUN: 6 mg/dL (ref 6–20)
CO2: 24 mmol/L (ref 22–32)
CREATININE: 0.82 mg/dL (ref 0.44–1.00)
Calcium: 9 mg/dL (ref 8.9–10.3)
Chloride: 104 mmol/L (ref 101–111)
GFR calc Af Amer: >60 mL/min (ref >60)
Glucose, Bld: 90 mg/dL (ref 65–99)
Potassium: 4.1 mmol/L (ref 3.5–5.1)
SODIUM: 138 mmol/L (ref 135–145)

## 2017-04-30 LAB — MAGNESIUM: MAGNESIUM: 2.1 mg/dL (ref 1.7–2.4)

## 2017-04-30 SURGERY — EGD (ESOPHAGOGASTRODUODENOSCOPY)
Anesthesia: Monitor Anesthesia Care

## 2017-04-30 MED ORDER — HYDROMORPHONE HCL 1 MG/ML IJ SOLN
0.2500 mg | Freq: Four times a day (QID) | INTRAMUSCULAR | Status: DC | PRN
  Administered 2017-04-30 – 2017-05-02 (×7): 0.25 mg via INTRAVENOUS
  Filled 2017-04-30 (×7): qty 1

## 2017-04-30 MED ORDER — PROPOFOL 500 MG/50ML IV EMUL
INTRAVENOUS | Status: DC | PRN
  Administered 2017-04-30: 75 ug/kg/min via INTRAVENOUS

## 2017-04-30 MED ORDER — POTASSIUM CHLORIDE IN NACL 40-0.9 MEQ/L-% IV SOLN
INTRAVENOUS | Status: DC
Start: 2017-04-30 — End: 2017-05-01
  Administered 2017-04-30 – 2017-05-01 (×2): 75 mL/h via INTRAVENOUS
  Filled 2017-04-30 (×2): qty 1000

## 2017-04-30 MED ORDER — DEXAMETHASONE SODIUM PHOSPHATE 10 MG/ML IJ SOLN
INTRAMUSCULAR | Status: AC
  Filled 2017-04-30: qty 1

## 2017-04-30 MED ORDER — LACTATED RINGERS IV SOLN
INTRAVENOUS | Status: DC
  Administered 2017-04-30: 1000 mL via INTRAVENOUS
  Administered 2017-04-30: 08:00:00 via INTRAVENOUS

## 2017-04-30 MED ORDER — LIDOCAINE HCL (CARDIAC) 20 MG/ML IV SOLN
INTRAVENOUS | Status: DC | PRN
  Administered 2017-04-30: 40 mg via INTRAVENOUS

## 2017-04-30 MED ORDER — MIDAZOLAM HCL 5 MG/5ML IJ SOLN
INTRAMUSCULAR | Status: DC | PRN
  Administered 2017-04-30: 2 mg via INTRAVENOUS

## 2017-04-30 MED ORDER — DEXAMETHASONE SODIUM PHOSPHATE 10 MG/ML IJ SOLN
INTRAMUSCULAR | Status: DC | PRN
  Administered 2017-04-30: 10 mg via INTRAVENOUS

## 2017-04-30 MED ORDER — PROPOFOL 10 MG/ML IV BOLUS
INTRAVENOUS | Status: DC | PRN
  Administered 2017-04-30: 50 mg via INTRAVENOUS
  Administered 2017-04-30: 30 mg via INTRAVENOUS

## 2017-04-30 NOTE — H&P (View-Only) (Signed)
PROGRESS NOTE   [name] Horn  BSJ:[id number]    DOB: [birth year]    DOA: 04/25/2017  PCP: Thurman Coyer, MD   I have briefly reviewed patients previous medical records in [facility].  Brief Narrative:  41 year old married female, PMH of uterine cancer status post hysterectomy & XRT, DVT now off medications, chronic pain, GERD/? Eosinophilic esophagitis (Dr. Dorrene German, GI in [facility]), prior C. difficile 3, gives approximately 1 month history of worsening reflux symptoms, PPI were increased by primary GI, had brief 2 days episodes of intractable nonbloody emesis which spontaneously resolved but followed by chronic intermittent nausea and heartburns. Couple of days prior to admission, developed recurrent nausea, nonbloody emesis, intermittent sharp substernal/epigastric pain radiating to back but no diarrhea. No sick contacts with similar symptoms or long distance travel. CT abdomen and pelvis without acute findings. Treated supportively but has persistent nausea, vomiting and unable to keep anything down. [facility] consulted for assistance on 5/3.Barium swallow: Small hiatal hernia. Ongoing symptoms. EGD planned for 5/6.  Assessment & Plan:   Principal Problem:   Nausea & vomiting Active Problems:   Chest pain   Nausea and vomiting   1. Intractable nausea, vomiting, abdominal pain: Gives protracted history of almost 4 weeks. Follows with GI/Dr. Dorrene German in [facility]. Reports EGD and "stretching" approximately 3 years back and was told that "eosinophilic esophagitis" could not be ruled out. Gives family history of eosinophilic Gastroenteritis. Ongoing problems with intermittent nausea, vomiting, reflux symptoms for 4 weeks which worsened over the couple days prior to admission. Also reports that since beginning of this year, she has difficulty swallowing and only eats when her husband is at home and had to have "Heimlich's maneuver" done once d/t choking. CT abdomen 5/1: No acute findings.  Hepatitic steatosis. Hysterectomy without metastatic disease. Does not seem like a viral GE. DD: Acute esophagitis/? Eosinophilic esophagitis, gastritis, ulcer disease, stricture versus other etiologies. Added IV PPI. Continue antiemetics and Reglan. Lipase normal. [facility] consultation and follow-up appreciated. Continue PPI and sucralfate added. Barium swallow: Small hiatal hernia. Ongoing symptoms. EGD planned for 5/6. 2. Hypokalemia: Replace IV and follow. 3. Atypical chest pain: Not consistent with ischemic type of chest pain. Low index of suspicion for PE despite negative d-dimer 1 and mildly positive the second time. Not hypoxic or tachycardic. No dyspnea reported. Likely GI related. Patient however did have history of DVT 1 for which she was treated with Coumadin for a year and has been off of it for 5-6 years. Discussed in detail with patient regarding low index of suspicion for PE, treating GI etiology and monitoring. However patient was anxious and stated that she wished to definitively rule out a pulmonary embolism and wanted to have CTA chest >no PE or acute findings. 2-D echo results: Normal EF-details below. Improved. 4. History of DVT 1: Completed a year of Coumadin approximately 5-6 years ago. 5. Utrine Cancer, s/p Hysterectomy & XRT: CT abdomen without mets. 6. Prior C Diff 3/diarrhea: Had 2 episodes of diarrhea on 5/4. C. difficile negative. 7. ADHD: continue Adderral.    DVT prophylaxis: SCD's. Add Lovenox for DVT prophylaxis. Code Status: Full Family Communication: None at bedside Disposition: DC home when medically stable and improved.   Consultants:  [facility]   Procedures:  None  Antimicrobials:  None    Subjective: Ongoing nausea-unable/reluctant to take by mouth for fear of vomiting. No vomiting for the last 36 hours. 2 episodes of diarrhea last night but none this  morning. Mild lower substernal/epigastric worsened with nausea or dry heaves.  ROS: No dyspnea  or palpitations. No dizziness or lightheadedness.  Objective:  Vitals:   04/28/17 1506 04/28/17 2152 04/29/17 0554 04/29/17 1409  BP: 117/75 117/70 109/65 118/68  Pulse: 70 (!) 58 61 63  Resp: 18 18 18 17   Temp: 98.7 F (37.1 C) 98.7 F (37.1 C) 98.3 F (36.8 C) 98.6 F (37 C)  TempSrc: Oral   Oral  SpO2: 98% 96% 97% 98%  Weight:      Height:        Examination:  General exam: Pleasant young female, lying comfortably propped up in bed. Oral mucosa moist. Respiratory system: Clear to auscultation. Respiratory effort normal. No reproducible chest wall tenderness. Cardiovascular system: S1 & S2 heard, RRR. No JVD, murmurs, rubs, gallops or clicks. No pedal edema. Telemetry: Sinus bradycardia in the 50s-sinus rhythm.  Gastrointestinal system: Abdomen is nondistended, soft . No tenderness appreciated. No organomegaly or masses felt. Normal bowel sounds heard. Central nervous system: Alert and oriented. No focal neurological deficits. Extremities: Symmetric 5 x 5 power. Skin: No rashes, lesions or ulcers Psychiatry: Judgement and insight appear normal. Mood & affect appropriate.     Data Reviewed: I have personally reviewed following labs and imaging studies  CBC:  Recent Labs Lab 04/25/17 1412 04/25/17 1429 04/26/17 0352  WBC 9.8  --  9.5  NEUTROABS 7.0  --  5.3  HGB 15.4* 15.6* 12.1  HCT 44.5 46.0 37.2  MCV 87.6  --  90.3  PLT 321  --  170   Basic Metabolic Panel:  Recent Labs Lab 04/25/17 1412 04/25/17 1429 04/26/17 0352 04/29/17 0532  NA 142 144 143 138  K 3.2* 3.2* 3.6 3.2*  CL 104 106 110 102  CO2 24  --  26 22  GLUCOSE 117* 120* 97 71  BUN 11 13 11 12   CREATININE 0.81 0.80 0.83 0.86  CALCIUM 11.1*  --  8.7* 8.7*   Liver Function Tests:  Recent Labs Lab 04/25/17 1412 04/26/17 0352  AST 25 24  ALT 23 22  ALKPHOS 95 64  BILITOT 0.9 0.7  PROT 8.0 5.9*  ALBUMIN 4.7 3.7   Cardiac Enzymes:  Recent Labs Lab 04/25/17 2244 04/26/17 0352  04/26/17 0957  TROPONINI <0.03 <0.03 <0.03      Radiology Studies: Darletta Moll Ugi  W/kub  Result Date: 04/28/2017 CLINICAL DATA:  Chest pain, dysphagia, nausea, vomiting, epigastric pain. History of reflux. EXAM: UPPER GI SERIES WITH KUB TECHNIQUE: After obtaining a scout radiograph a routine upper GI series was performed using thin and high density barium. FLUOROSCOPY TIME:  Fluoroscopy Time:  2 minutes Radiation Exposure Index (if provided by the fluoroscopic device): Number of Acquired Spot Images: 0 COMPARISON:  CT 04/25/2017 FINDINGS: Scout film demonstrates changes of prior cholecystectomy. No evidence of bowel obstruction organomegaly. Fluoroscopic evaluation of swallowing demonstrates normal esophageal peristalsis. No fixed stricture, fold thickening or mass. No reflux with the water siphon maneuver. Small hiatal hernia. The patient swallowed a 13 mm barium tablet which freely passed into the stomach. Stomach, duodenal bulb and duodenal sweep are unremarkable. No ulceration, fold thickening or mass. IMPRESSION: Small hiatal hernia.  Otherwise unremarkable study. Electronically Signed   By: Rolm Baptise M.D.   On: 04/28/2017 16:38        Scheduled Meds: . amphetamine-dextroamphetamine  20 mg Oral TID  . enoxaparin (LOVENOX) injection  40 mg Subcutaneous Daily  . metoCLOPramide (REGLAN) injection  5 mg Intravenous Q6H  .  pantoprazole (PROTONIX) IV  40 mg Intravenous Daily   Continuous Infusions: . sodium chloride 100 mL/hr at 04/29/17 1103     LOS: 3 days     Amarri Satterly, MD, FACP, FHM. [facility] Pager [phone]  If 7PM-7AM, please contact night-coverage [email or web address] Password [email or web address] 04/29/2017, 2:17 PM

## 2017-04-30 NOTE — Transfer of Care (Signed)
Immediate Anesthesia Transfer of Care Note  Patient: MARCHETA HORSEY  Procedure(s) Performed: Procedure(s): ESOPHAGOGASTRODUODENOSCOPY (EGD) (N/A)  Patient Location: PACU  Anesthesia Type:MAC  Level of Consciousness: awake, alert , oriented and drowsy  Airway & Oxygen Therapy: Patient Spontanous Breathing and Patient connected to nasal cannula oxygen  Post-op Assessment: Report given to RN, Post -op Vital signs reviewed and stable, Patient moving all extremities and Patient moving all extremities X 4  Post vital signs: Reviewed and stable  Last Vitals:  Vitals:   04/30/17 0814 04/30/17 0912  BP: (!) 124/92   Pulse: 60   Resp: 12   Temp: 36.7 C 36.9 C    Last Pain:  Vitals:   04/30/17 0912  TempSrc: Oral  PainSc:       Patients Stated Pain Goal: 2 (21/97/58 8325)  Complications: No apparent anesthesia complications

## 2017-04-30 NOTE — Progress Notes (Signed)
PROGRESS NOTE   [name] Horn  BWG:[id number]    DOB: 1976/09/30    DOA: 04/25/2017  PCP: Thurman Coyer, MD   I have briefly reviewed patients previous medical records in [facility].  Brief Narrative:  41 year old married female, PMH of uterine cancer status post hysterectomy & XRT, DVT now off medications, chronic pain, GERD/? Eosinophilic esophagitis (Dr. Dorrene German, GI in [facility]), prior C. difficile 3, gives approximately 1 month history of worsening reflux symptoms, PPI were increased by primary GI, had brief 2 days episodes of intractable nonbloody emesis which spontaneously resolved but followed by chronic intermittent nausea and heartburns. Couple of days prior to admission, developed recurrent nausea, nonbloody emesis, intermittent sharp substernal/epigastric pain radiating to back but no diarrhea. No sick contacts with similar symptoms or long distance travel. CT abdomen and pelvis without acute findings. Treated supportively but has persistent nausea, vomiting and unable to keep anything down. [facility] consulted for assistance on 5/3.Barium swallow: Small hiatal hernia. Ongoing symptoms. Status post EGD 5/6.  Assessment & Plan:   Principal Problem:   Nausea & vomiting Active Problems:   Chest pain   Nausea and vomiting   1. Intractable nausea, vomiting, abdominal pain: Gives protracted history of almost 4 weeks. Follows with GI/Dr. Dorrene German in [facility]. Reports EGD and "stretching" approximately 3 years back and was told that "eosinophilic esophagitis" could not be ruled out. Gives family history of eosinophilic Gastroenteritis. Ongoing problems with intermittent nausea, vomiting, reflux symptoms for 4 weeks which worsened over the couple days prior to admission. Also reports that since beginning of this year, she has difficulty swallowing and only eats when her husband is at home and had to have "Heimlich's maneuver" done once d/t choking. CT abdomen 5/1: No acute findings.  Hepatitic steatosis. Hysterectomy without metastatic disease. Does not seem like a viral GE. Continue IV PPI, sucralfate and antiemetics. Lipase normal. [facility] consultation and follow-up appreciated. Barium swallow: Small hiatal hernia. Ongoing symptoms. EGD 5/6 showed patchy mild gastritis in the antrum-symptoms seem out of proportion to this finding. Advance diet as tolerated. 2. Hypokalemia: Replaced. 3. Atypical chest pain: Not consistent with ischemic type of chest pain. Low index of suspicion for PE despite negative d-dimer 1 and mildly positive the second time. Not hypoxic or tachycardic. No dyspnea reported. Likely GI related. Patient however did have history of DVT 1 for which she was treated with Coumadin for a year and has been off of it for 5-6 years. Discussed in detail with patient regarding low index of suspicion for PE, treating GI etiology and monitoring. However patient was anxious and stated that she wished to definitively rule out a pulmonary embolism and wanted to have CTA chest >no PE or acute findings. 2-D echo results: Normal EF-details below. Improved. Minimize opioids. 4. History of DVT 1: Completed a year of Coumadin approximately 5-6 years ago. 5. Utrine Cancer, s/p Hysterectomy & XRT: CT abdomen without mets. 6. Prior C Diff 3/diarrhea: Had 2 episodes of diarrhea on 5/4. C. difficile negative. 7. ADHD: continue Adderral.    DVT prophylaxis: SCD's. Add Lovenox for DVT prophylaxis. Code Status: Full Family Communication: Discussed with patient's mother and aunt at bedside. Disposition: DC home when medically stable and improved.   Consultants:  [facility]   Procedures:  EGD 04/30/17.  Antimicrobials:  None    Subjective: Ongoing nausea. One loose BM in the last 24 hours. Not much pain reported.  ROS: No dyspnea or palpitations. No dizziness or  lightheadedness.  Objective:  Vitals:   04/30/17 0912 04/30/17 0920 04/30/17 0930 04/30/17 1150  BP: 119/74 (!)  142/77 136/86 120/73  Pulse: 62 (!) 56 (!) 53 64  Resp: 18 18 18    Temp: 98.5 F (36.9 C)     TempSrc: Oral     SpO2: 100% 100% 99% 98%  Weight:      Height:        Examination:  General exam: Pleasant young female, lying comfortably propped up in bed. Oral mucosa moist. Respiratory system: Clear to auscultation. Respiratory effort normal. No reproducible chest wall tenderness. Cardiovascular system: S1 & S2 heard, RRR. No JVD, murmurs, rubs, gallops or clicks. No pedal edema. Gastrointestinal system: Abdomen is nondistended, soft and nontender. No tenderness appreciated. No organomegaly or masses felt. Normal bowel sounds heard. Central nervous system: Alert and oriented. No focal neurological deficits. Extremities: Symmetric 5 x 5 power. Skin: No rashes, lesions or ulcers Psychiatry: Judgement and insight appear normal. Mood & affect appropriate.     Data Reviewed: I have personally reviewed following labs and imaging studies  CBC:  Recent Labs Lab 04/25/17 1412 04/25/17 1429 04/26/17 0352  WBC 9.8  --  9.5  NEUTROABS 7.0  --  5.3  HGB 15.4* 15.6* 12.1  HCT 44.5 46.0 37.2  MCV 87.6  --  90.3  PLT 321  --  381   Basic Metabolic Panel:  Recent Labs Lab 04/25/17 1412 04/25/17 1429 04/26/17 0352 04/29/17 0532 04/30/17 0343  NA 142 144 143 138 138  K 3.2* 3.2* 3.6 3.2* 4.1  CL 104 106 110 102 104  CO2 24  --  26 22 24   GLUCOSE 117* 120* 97 71 90  BUN 11 13 11 12 6   CREATININE 0.81 0.80 0.83 0.86 0.82  CALCIUM 11.1*  --  8.7* 8.7* 9.0  MG  --   --   --   --  2.1   Liver Function Tests:  Recent Labs Lab 04/25/17 1412 04/26/17 0352  AST 25 24  ALT 23 22  ALKPHOS 95 64  BILITOT 0.9 0.7  PROT 8.0 5.9*  ALBUMIN 4.7 3.7   Cardiac Enzymes:  Recent Labs Lab 04/25/17 2244 04/26/17 0352 04/26/17 0957  TROPONINI <0.03 <0.03 <0.03      Radiology Studies: Darletta Moll Ugi  W/kub  Result Date: 04/28/2017 CLINICAL DATA:  Chest pain, dysphagia, nausea, vomiting,  epigastric pain. History of reflux. EXAM: UPPER GI SERIES WITH KUB TECHNIQUE: After obtaining a scout radiograph a routine upper GI series was performed using thin and high density barium. FLUOROSCOPY TIME:  Fluoroscopy Time:  2 minutes Radiation Exposure Index (if provided by the fluoroscopic device): Number of Acquired Spot Images: 0 COMPARISON:  CT 04/25/2017 FINDINGS: Scout film demonstrates changes of prior cholecystectomy. No evidence of bowel obstruction organomegaly. Fluoroscopic evaluation of swallowing demonstrates normal esophageal peristalsis. No fixed stricture, fold thickening or mass. No reflux with the water siphon maneuver. Small hiatal hernia. The patient swallowed a 13 mm barium tablet which freely passed into the stomach. Stomach, duodenal bulb and duodenal sweep are unremarkable. No ulceration, fold thickening or mass. IMPRESSION: Small hiatal hernia.  Otherwise unremarkable study. Electronically Signed   By: Rolm Baptise M.D.   On: 04/28/2017 16:38        Scheduled Meds: . amphetamine-dextroamphetamine  20 mg Oral TID  . enoxaparin (LOVENOX) injection  40 mg Subcutaneous Daily  . metoCLOPramide (REGLAN) injection  5 mg Intravenous Q6H  . pantoprazole (PROTONIX) IV  40 mg  Intravenous Daily   Continuous Infusions: . 0.9 % NaCl with KCl 40 mEq / L 75 mL/hr (04/30/17 0530)     LOS: 4 days     Virgal Warmuth, MD, FACP, FHM. [facility] Pager [phone]  If 7PM-7AM, please contact night-coverage [email or web address] Password [email or web address] 04/30/2017, 3:04 PM

## 2017-04-30 NOTE — Interval H&P Note (Signed)
History and Physical Interval Note:  04/30/2017 8:41 AM  [name] Horn  has presented today for surgery, with the diagnosis of Dysphagia nausea and vomiting  The various methods of treatment have been discussed with the patient and family. After consideration of risks, benefits and other options for treatment, the patient has consented to  Procedure(s): ESOPHAGOGASTRODUODENOSCOPY (EGD) (N/A) as a surgical intervention .  The patient's history has been reviewed, patient examined, no change in status, stable for surgery.  I have reviewed the patient's chart and labs.  Questions were answered to the patient's satisfaction.     Jersey Ravenscroft C

## 2017-04-30 NOTE — Anesthesia Postprocedure Evaluation (Addendum)
Anesthesia Post Note  Patient: [name] Horn  Procedure(s) Performed: Procedure(s) (LRB): ESOPHAGOGASTRODUODENOSCOPY (EGD) (N/A)  Patient location during evaluation: PACU Anesthesia Type: MAC Level of consciousness: awake and alert Pain management: pain level controlled Vital Signs Assessment: post-procedure vital signs reviewed and stable Respiratory status: spontaneous breathing, nonlabored ventilation, respiratory function stable and patient connected to nasal cannula oxygen Cardiovascular status: stable and blood pressure returned to baseline Anesthetic complications: no       Last Vitals:  Vitals:   04/30/17 0920 04/30/17 0930  BP: (!) 142/77 136/86  Pulse: (!) 56 (!) 53  Resp: 18 18  Temp:      Last Pain:  Vitals:   04/30/17 0912  TempSrc: Oral  PainSc:                  Bethann Qualley S

## 2017-04-30 NOTE — Op Note (Signed)
[facility] Patient Name: [name] Horn Procedure Date : 04/30/2017 MRN: [id number] Attending MD: Missy Sabins , MD Date of Birth: [birth year] CSN: [id number] Age: 41 Admit Type: Outpatient Procedure:                Upper GI endoscopy Indications:              Dysphagia, Unexplained chest pain Providers:                Elyse Jarvis. Amedeo Plenty, MD, Burtis Junes, RN, Lillie Fragmin,                            RN, William Dalton, Technician, Marcene Duos,                            Technician Referring MD:              Medicines:                Propofol per Anesthesia Complications:            No immediate complications. Estimated Blood Loss:     Estimated blood loss: none. Procedure:                Pre-Anesthesia Assessment:                           - Prior to the procedure, a History and Physical                            was performed, and patient medications and                            allergies were reviewed. The patient's tolerance of                            previous anesthesia was also reviewed. The risks                            and benefits of the procedure and the sedation                            options and risks were discussed with the patient.                            All questions were answered, and informed consent                            was obtained. Prior Anticoagulants: The patient has                            taken no previous anticoagulant or antiplatelet                            agents. ASA Grade Assessment: II - A patient with  mild systemic disease. After reviewing the risks                            and benefits, the patient was deemed in                            satisfactory condition to undergo the procedure.                           After obtaining informed consent, the endoscope was                            passed under direct vision. Throughout the                            procedure, the patient's  blood pressure, pulse, and                            oxygen saturations were monitored continuously. The                            EG-[id number] ([id number]) scope was introduced through the                            mouth, and advanced to the second part of duodenum.                            The upper GI endoscopy was accomplished without                            difficulty. The patient tolerated the procedure                            well. Scope In: Scope Out: Findings:      A widely patent Schatzki ring (acquired) was found at the       gastroesophageal junction. A guidewire was placed and the scope was       withdrawn. Dilation was performed with a Savary dilator with mild       resistance at 16 mm. Biopsies were taken with a cold forceps for       histology.      Patchy mild inflammation characterized by erythema and granularity was       found in the gastric antrum. Biopsies were taken with a cold forceps for       histology.      The examined duodenum was normal. Biopsies were taken with a cold       forceps for histology.      A small hiatal hernia was present. Impression:               - Widely patent Schatzki ring. Dilated. Biopsied.                           - Gastritis. Biopsied.                           -  Normal examined duodenum. Biopsied.                           - Small hiatal hernia. Moderate Sedation:      no moderate sedation Recommendation:           - Await pathology results.                           - Mechanical soft diet.                           - Continue present medications.                           - Return to my office in 4 weeks. Procedure Code(s):        --- Professional ---                           [phone], Esophagogastroduodenoscopy, flexible,                            transoral; with insertion of guide wire followed by                            passage of dilator(s) through esophagus over guide                            wire                            [id number], Esophagogastroduodenoscopy, flexible,                            transoral; with biopsy, single or multiple Diagnosis Code(s):        --- Professional ---                           K22.2, Esophageal obstruction                           K29.70, Gastritis, unspecified, without bleeding                           R13.10, Dysphagia, unspecified                           R07.9, Chest pain, unspecified CPT copyright 2016 American Medical Association. All rights reserved. The codes documented in this report are preliminary and upon coder review may  be revised to meet current compliance requirements. Missy Sabins, MD 04/30/2017 9:13:04 AM This report has been signed electronically. Number of Addenda: 0

## 2017-04-30 NOTE — Anesthesia Preprocedure Evaluation (Signed)
Anesthesia Evaluation  Patient identified by MRN, date of birth, ID band Patient awake    Reviewed: Allergy & Precautions, NPO status , Patient's Chart, lab work & pertinent test results  Airway Mallampati: II  TM Distance: >3 FB Neck ROM: Full    Dental no notable dental hx.    Pulmonary neg pulmonary ROS,    Pulmonary exam normal breath sounds clear to auscultation       Cardiovascular + DVT  Normal cardiovascular exam Rhythm:Regular Rate:Normal     Neuro/Psych negative neurological ROS  negative psych ROS   GI/Hepatic negative GI ROS, Neg liver ROS,   Endo/Other  negative endocrine ROS  Renal/GU negative Renal ROS  negative genitourinary   Musculoskeletal negative musculoskeletal ROS (+)   Abdominal   Peds negative pediatric ROS (+)  Hematology negative hematology ROS (+)   Anesthesia Other Findings   Reproductive/Obstetrics negative OB ROS                             Anesthesia Physical Anesthesia Plan  ASA: II  Anesthesia Plan: MAC   Post-op Pain Management:    Induction: Intravenous  Airway Management Planned: Nasal Cannula  Additional Equipment:   Intra-op Plan:   Post-operative Plan:   Informed Consent: I have reviewed the patients History and Physical, chart, labs and discussed the procedure including the risks, benefits and alternatives for the proposed anesthesia with the patient or authorized representative who has indicated his/her understanding and acceptance.   Dental advisory given  Plan Discussed with: CRNA and Surgeon  Anesthesia Plan Comments:         Anesthesia Quick Evaluation

## 2017-05-01 ENCOUNTER — Encounter (HOSPITAL_COMMUNITY): Payer: Self-pay | Admitting: Gastroenterology

## 2017-05-01 MED ORDER — PANTOPRAZOLE SODIUM 40 MG IV SOLR
40.0000 mg | Freq: Two times a day (BID) | INTRAVENOUS | Status: DC
  Administered 2017-05-01 – 2017-05-02 (×2): 40 mg via INTRAVENOUS
  Filled 2017-05-01 (×2): qty 40

## 2017-05-01 MED ORDER — POTASSIUM CHLORIDE IN NACL 40-0.9 MEQ/L-% IV SOLN
INTRAVENOUS | Status: AC
  Administered 2017-05-01 – 2017-05-02 (×2): 50 mL/h via INTRAVENOUS
  Filled 2017-05-01: qty 1000

## 2017-05-01 NOTE — Progress Notes (Signed)
[facility] Progress Note  Subjective: Modest improvement in reflux symptoms, chest and epigastric pain and an food intolerance. No more vomiting.  Objective: Vital signs in last 24 hours: Temp:  [98 F (36.7 C)-98.5 F (36.9 C)] 98 F (36.7 C) (05/07 0520) Pulse Rate:  [64-106] 67 (05/07 0520) Resp:  [18] 18 (05/07 0520) BP: (95-112)/(50-63) 95/50 (05/07 0520) SpO2:  [98 %-99 %] 98 % (05/07 0520) Weight change:    PE: Unchanged  Lab Results: No results found for this or any previous visit (from the past 24 hour(s)).  Studies/Results: No results found.    Assessment: Dysphagia, nausea vomiting, reflux symptoms and epigastric pain. EGD showing a small lower esophageal ring was dilated. Biopsies were taken to rule out eosinophilic esophagitis and gastroenteritis.  Plan: Increase Protonix to twice a day Monitor 1 more day in hospital probably home in the morning. Hopefully biopsies back tomorrow, if not I will follow-up and patient will see me in follow-up.    Cambreigh Dearing C 05/01/2017, 1:12 PM  Pager [phone] If no answer or after 5 PM call [phone]

## 2017-05-01 NOTE — Progress Notes (Addendum)
PROGRESS NOTE   [name] Horn  VAN:[id number]    DOB: [birth year]    DOA: 04/25/2017  PCP: Thurman Coyer, MD   I have briefly reviewed patients previous medical records in [facility].  Brief Narrative:  41 year old married female, PMH of uterine cancer status post hysterectomy & XRT, DVT now off medications, chronic pain, GERD/? Eosinophilic esophagitis (Dr. Dorrene German, GI in [facility]), prior C. difficile 3, gives approximately 1 month history of worsening reflux symptoms, PPI were increased by primary GI, had brief 2 days episodes of intractable nonbloody emesis which spontaneously resolved but followed by chronic intermittent nausea and heartburns. Couple of days prior to admission, developed recurrent nausea, nonbloody emesis, intermittent sharp substernal/epigastric pain radiating to back but no diarrhea. No sick contacts with similar symptoms or long distance travel. CT abdomen and pelvis without acute findings. Treated supportively but has persistent nausea, vomiting and unable to keep anything down. [facility] consulted for assistance on 5/3.Barium swallow: Small hiatal hernia. Ongoing symptoms. Status post EGD 5/6.Improving.  Assessment & Plan:   Principal Problem:   Nausea & vomiting Active Problems:   Chest pain   Nausea and vomiting   1. Intractable nausea, vomiting, abdominal pain: Gives protracted history of almost 4 weeks. Follows with GI/Dr. Dorrene German in [facility] Reports EGD and "stretching" approximately 3 years back and was told that "eosinophilic esophagitis" could not be ruled out. Gives family history of eosinophilic Gastroenteritis. Ongoing problems with intermittent nausea, vomiting, reflux symptoms for 4 weeks which worsened over the couple days prior to admission. Also reports that since beginning of this year, she has difficulty swallowing and only eats when her husband is at home and had to have "Heimlich's maneuver" done once d/t choking. CT abdomen 5/1: No acute  findings. Hepatitic steatosis. Hysterectomy without metastatic disease. Does not seem like a viral GE. Continue IV PPI, sucralfate and antiemetics. Lipase normal. Barium swallow: Small hiatal hernia. Ongoing symptoms. EGD 5/6 showed patchy mild gastritis in the antrum-symptoms seem out of proportion to this finding. Somewhat better. As per GI follow-up, EGD and small lower esophageal ring was dilated, biopsies taken to rule out eosinophilic esophagitis and gastroenteritis, increased Protonix to twice daily, monitor additional day in the hospital and follow biopsy results and outpatient with Dr. Amedeo Plenty. 2. Dysphagia: EGD and small lower esophageal ring was dilated. 3. Hypokalemia: Replaced. 4. Atypical chest pain: Not consistent with ischemic type of chest pain. Low index of suspicion for PE despite negative d-dimer 1 and mildly positive the second time. Not hypoxic or tachycardic. No dyspnea reported. Likely GI related. Patient however did have history of DVT 1 for which she was treated with Coumadin for a year and has been off of it for 5-6 years. Discussed in detail with patient regarding low index of suspicion for PE, treating GI etiology and monitoring. However patient was anxious and stated that she wished to definitively rule out a pulmonary embolism and wanted to have CTA chest >no PE or acute findings. 2-D echo results: Normal EF-details below. Improved. Minimize opioids. 5. History of DVT 1: Completed a year of Coumadin approximately 5-6 years ago. 6. Utrine Cancer, s/p Hysterectomy & XRT: CT abdomen without mets. 7. Prior C Diff 3/diarrhea: Had 2 episodes of diarrhea on 5/4. C. difficile negative. 8. ADHD: continue Adderral.    DVT prophylaxis: SCD's. Added Lovenox for DVT prophylaxis. Code Status: Full Family Communication: Discussed with patient's spouse & mother at bedside. Disposition: DC home when medically stable and improved,  possibly 5/8   Consultants:  [facility]   Procedures:    EGD 04/30/17.  Antimicrobials:  None    Subjective: Overall feels better. Tolerated couple of small meals since EGD yesterday and able to keep down most. Small emesis last night. Not much pain reported.  ROS: No dyspnea or palpitations. No dizziness or lightheadedness.  Objective:  Vitals:   04/30/17 1150 04/30/17 1515 04/30/17 2208 05/01/17 0520  BP: 120/73 112/62 102/63 (!) 95/50  Pulse: 64 (!) 106 64 67  Resp:  18 18 18   Temp:  98.5 F (36.9 C) 98.5 F (36.9 C) 98 F (36.7 C)  TempSrc:   Oral Oral  SpO2: 98% 99% 98% 98%  Weight:      Height:        Examination:  General exam: Pleasant young female, lying comfortably propped up in bed. Oral mucosa moist. Looks better than she did a couple days ago. Respiratory system: Clear to auscultation. Respiratory effort normal. No reproducible chest wall tenderness. Cardiovascular system: S1 & S2 heard, RRR. No JVD, murmurs, rubs, gallops or clicks. No pedal edema. Gastrointestinal system: Abdomen is nondistended, soft and nontender. No tenderness appreciated. No organomegaly or masses felt. Normal bowel sounds heard. Central nervous system: Alert and oriented. No focal neurological deficits. Extremities: Symmetric 5 x 5 power. Skin: No rashes, lesions or ulcers Psychiatry: Judgement and insight appear normal. Mood & affect appropriate.     Data Reviewed: I have personally reviewed following labs and imaging studies  CBC:  Recent Labs Lab 04/25/17 1412 04/25/17 1429 04/26/17 0352  WBC 9.8  --  9.5  NEUTROABS 7.0  --  5.3  HGB 15.4* 15.6* 12.1  HCT 44.5 46.0 37.2  MCV 87.6  --  90.3  PLT 321  --  846   Basic Metabolic Panel:  Recent Labs Lab 04/25/17 1412 04/25/17 1429 04/26/17 0352 04/29/17 0532 04/30/17 0343  NA 142 144 143 138 138  K 3.2* 3.2* 3.6 3.2* 4.1  CL 104 106 110 102 104  CO2 24  --  26 22 24   GLUCOSE 117* 120* 97 71 90  BUN 11 13 11 12 6   CREATININE 0.81 0.80 0.83 0.86 0.82  CALCIUM 11.1*  --   8.7* 8.7* 9.0  MG  --   --   --   --  2.1   Liver Function Tests:  Recent Labs Lab 04/25/17 1412 04/26/17 0352  AST 25 24  ALT 23 22  ALKPHOS 95 64  BILITOT 0.9 0.7  PROT 8.0 5.9*  ALBUMIN 4.7 3.7   Cardiac Enzymes:  Recent Labs Lab 04/25/17 2244 04/26/17 0352 04/26/17 0957  TROPONINI <0.03 <0.03 <0.03      Radiology Studies: No results found.      Scheduled Meds: . amphetamine-dextroamphetamine  20 mg Oral TID  . enoxaparin (LOVENOX) injection  40 mg Subcutaneous Daily  . metoCLOPramide (REGLAN) injection  5 mg Intravenous Q6H  . pantoprazole (PROTONIX) IV  40 mg Intravenous Daily   Continuous Infusions: . 0.9 % NaCl with KCl 40 mEq / L 75 mL/hr (05/01/17 0657)     LOS: 5 days     Ashwini Jago, MD, FACP, FHM. [facility] Pager [phone]  If 7PM-7AM, please contact night-coverage [email or web address] Password [facility] 05/01/2017, 3:57 PM

## 2017-05-02 DIAGNOSIS — R1310 Dysphagia, unspecified: Secondary | ICD-10-CM

## 2017-05-02 MED ORDER — PANTOPRAZOLE SODIUM 40 MG PO TBEC: 40.0000 mg | DELAYED_RELEASE_TABLET | Freq: Two times a day (BID) | ORAL | 1 refills | Status: DC

## 2017-05-02 MED ORDER — HYDROCODONE-ACETAMINOPHEN 5-300 MG PO TABS: 1.0000 | ORAL_TABLET | Freq: Four times a day (QID) | ORAL | 0 refills | Status: DC | PRN

## 2017-05-02 MED ORDER — ONDANSETRON HCL 4 MG PO TABS: 4.0000 mg | ORAL_TABLET | Freq: Four times a day (QID) | ORAL | 0 refills | Status: DC | PRN

## 2017-05-02 NOTE — Discharge Instructions (Signed)
Follow with Primary MD Cloward, Dianna Rossetti, MD in 7 days   Get CBC, CMP,  checked  by Primary MD next visit.    Activity: As tolerated with Full fall precautions use walker/cane & assistance as needed   Disposition Home    Diet:  Soft diet  On your next visit with your primary care physician please Get Medicines reviewed and adjusted.   Please request your Prim.MD to go over all Hospital Tests and Procedure/Radiological results at the follow up, please get all Hospital records sent to your Prim MD by signing hospital release before you go home.   If you experience worsening of your admission symptoms, develop shortness of breath, life threatening emergency, suicidal or homicidal thoughts you must seek medical attention immediately by calling 911 or calling your MD immediately  if symptoms less severe.  You Must read complete instructions/literature along with all the possible adverse reactions/side effects for all the Medicines you take and that have been prescribed to you. Take any new Medicines after you have completely understood and accpet all the possible adverse reactions/side effects.   Do not drive, operating heavy machinery, perform activities at heights, swimming or participation in water activities or provide baby sitting services if your were admitted for syncope or siezures until you have seen by Primary MD or a Neurologist and advised to do so again.  Do not drive when taking Pain medications.    Do not take more than prescribed Pain, Sleep and Anxiety Medications  Special Instructions: If you have smoked or chewed Tobacco  in the last 2 yrs please stop smoking, stop any regular Alcohol  and or any Recreational drug use.  Wear Seat belts while driving.   Please note  You were cared for by a hospitalist during your hospital stay. If you have any questions about your discharge medications or the care you received while you were in the hospital after you are discharged,  you can call the unit and asked to speak with the hospitalist on call if the hospitalist that took care of you is not available. Once you are discharged, your primary care physician will handle any further medical issues. Please note that NO REFILLS for any discharge medications will be authorized once you are discharged, as it is imperative that you return to your primary care physician (or establish a relationship with a primary care physician if you do not have one) for your aftercare needs so that they can reassess your need for medications and monitor your lab values.

## 2017-05-02 NOTE — Discharge Summary (Signed)
[name] Horn, is a 41 y.o. female  DOB 03-Aug-1976  MRN [id number].  Admission date:  04/25/2017  Admitting Physician  Rise Patience, MD  Discharge Date:  05/02/2017   Primary MD  Cloward, Dianna Rossetti, MD  Recommendations for primary care physician for things to follow:  - Please check CBC, BMP during next visit - Patient to follow with [facility] regarding final results of EGD biopsy,    Admission Diagnosis  Nausea & vomiting [R11.2]   Discharge Diagnosis  Nausea & vomiting [R11.2]    Principal Problem:   Nausea & vomiting Active Problems:   Chest pain   Nausea and vomiting      Past Medical History:  Diagnosis Date  . DVT (deep venous thrombosis) (Christ[place])    x2  . Uterine cancer [facility])     Past Surgical History:  Procedure Laterality Date  . APPENDECTOMY    . CHOLECYSTECTOMY    . ESOPHAGOGASTRODUODENOSCOPY N/A 04/30/2017   Procedure: ESOPHAGOGASTRODUODENOSCOPY (EGD);  Surgeon: Teena Irani, MD;  Location: [facility] ENDOSCOPY;  Service: Endoscopy;  Laterality: N/A;  . KNEE SURGERY     x3  . MINIMALLY INVASIVE FORAMINOTOMY CERVICAL SPINE     C6-T1, Nitka  . SAVORY DILATION N/A 04/30/2017   Procedure: SAVORY DILATION;  Surgeon: Teena Irani, MD;  Location: [facility] ENDOSCOPY;  Service: Endoscopy;  Laterality: N/A;  . TONSILLECTOMY    . TOTAL ABDOMINAL HYSTERECTOMY     Sarcoma, s/p XRT       History of present illness and  Hospital Course:     Kindly see H&P for history of present illness and admission details, please review complete Labs, Consult reports and Test reports for all details in brief  HPI  from the history and physical done on the day of admission 04/25/2017  41 year old married female, PMH of uterine cancer status post hysterectomy & XRT, DVT now off medications, chronic pain, GERD/? Eosinophilic esophagitis (Dr. Dorrene German, GI in [facility]), prior C. difficile 3, gives approximately 1  month history of worsening reflux symptoms, PPI were increased by primary GI, had brief 2 days episodes of intractable nonbloody emesis which spontaneously resolved but followed by chronic intermittent nausea and heartburns. Couple of days prior to admission, developed recurrent nausea, nonbloody emesis, intermittent sharp substernal/epigastric pain radiating to back but no diarrhea. No sick contacts with similar symptoms or long distance travel. CT abdomen and pelvis without acute findings. Treated supportively but has persistent nausea, vomiting and unable to keep anything down. [facility] consulted for assistance on 5/3.Barium swallow: Small hiatal hernia. Ongoing symptoms. Status post EGD 5/6.Improving.  Hospital Course   1. Intractable nausea, vomiting, abdominal pain: Gives protracted history of almost 4 weeks. Follows with GI/Dr. Dorrene German in [facility]. Reports EGD and "stretching" approximately 3 years back and was told that "eosinophilic esophagitis" could not be ruled out. Gives family history of eosinophilic Gastroenteritis. Ongoing problems with intermittent nausea, vomiting, reflux symptoms for 4 weeks which worsened over the couple days prior to admission. Also reports that  since beginning of this year, she has difficulty swallowing and only eats when her husband is at home and had to have "Heimlich's maneuver" done once d/t choking. CT abdomen 5/1: No acute findings. Hepatitic steatosis. Hysterectomy without metastatic disease. Does not seem like a viral GE. Continue IV PPI, sucralfate and antiemetics. Lipase normal. Barium swallow: Small hiatal hernia. Ongoing symptoms. EGD on 5/6 showing patchy mild gastritis in the antrum, as well small lower esophageal ring was dilated, and biopsies to rule out eosinophilic esophagitis and gastroenteritis, as well as her Protonix dose was increased to 40 mg twice a day, her diet was advanced gradually, she's been consulted for last 48 hours, she denies any further  nausea or vomiting, and abdominal pain significantly subsided, to follow on biopsy results with Dr. Amedeo Plenty in outpatient setting . 2. Dysphagia: EGD showing small lower esophageal ring which was dilated, she has been tolerating soft diet . 3. Hypokalemia: Replaced. 4. Atypical chest pain: Not consistent with ischemic type of chest pain. Low index of suspicion for PE despite negative d-dimer 1 and mildly positive the second time. Not hypoxic or tachycardic. No dyspnea reported. Likely GI related. Patient however did have history of DVT 1 for which she was treated with Coumadin for a year and has been off of it for 5-6 years. Discussed in detail with patient regarding low index of suspicion for PE, treating GI etiology and monitoring. However patient was anxious and stated that she wished to definitively rule out a pulmonary embolism and wanted to have CTA chest >no PE or acute findings. 2-D echo results: Normal EF-details below. Improved. Minimize opioids. 5. History of DVT 1: Completed a year of Coumadin approximately 5-6 years ago. 6. Utrine Cancer, s/p Hysterectomy & XRT: CT abdomen without mets. 7. Prior C Diff 3/diarrhea: Had 2 episodes of diarrhea on 5/4. C. difficile negative. 8. ADHD: continue Adderral.     Discharge Condition: Stable   Follow UP  Follow-up Information    Teena Irani, MD Follow up in 4 week(s).   Specialty:  Gastroenterology Contact information: [address] [place] [address] [phone]             Discharge Instructions  and  Discharge Medications     Discharge Instructions    Discharge instructions    Complete by:  As directed    Follow with Primary MD Cloward, Dianna Rossetti, MD in 7 days   Get CBC, CMP,  checked  by Primary MD next visit.    Activity: As tolerated with Full fall precautions use walker/cane & assistance as needed   Disposition Home    Diet:  Soft diet  On your next visit with your primary care physician  please Get Medicines reviewed and adjusted.   Please request your Prim.MD to go over all Hospital Tests and Procedure/Radiological results at the follow up, please get all Hospital records sent to your Prim MD by signing hospital release before you go home.   If you experience worsening of your admission symptoms, develop shortness of breath, life threatening emergency, suicidal or homicidal thoughts you must seek medical attention immediately by calling 911 or calling your MD immediately  if symptoms less severe.  You Must read complete instructions/literature along with all the possible adverse reactions/side effects for all the Medicines you take and that have been prescribed to you. Take any new Medicines after you have completely understood and accpet all the possible adverse reactions/side effects.   Do not drive,  operating heavy machinery, perform activities at heights, swimming or participation in water activities or provide baby sitting services if your were admitted for syncope or siezures until you have seen by Primary MD or a Neurologist and advised to do so again.  Do not drive when taking Pain medications.    Do not take more than prescribed Pain, Sleep and Anxiety Medications  Special Instructions: If you have smoked or chewed Tobacco  in the last 2 yrs please stop smoking, stop any regular Alcohol  and or any Recreational drug use.  Wear Seat belts while driving.   Please note  You were cared for by a hospitalist during your hospital stay. If you have any questions about your discharge medications or the care you received while you were in the hospital after you are discharged, you can call the unit and asked to speak with the hospitalist on call if the hospitalist that took care of you is not available. Once you are discharged, your primary care physician will handle any further medical issues. Please note that NO REFILLS for any discharge medications will be authorized once  you are discharged, as it is imperative that you return to your primary care physician (or establish a relationship with a primary care physician if you do not have one) for your aftercare needs so that they can reassess your need for medications and monitor your lab values.   Increase activity slowly    Complete by:  As directed      Allergies as of 05/02/2017      Reactions   Sulfa Antibiotics Anaphylaxis, Shortness Of Breath, Swelling, Rash   Angioedema (also)   Sulfonamide Derivatives Hives, Shortness Of Breath, Swelling   TONGUE SWELLS   Benadryl [diphenhydramine Hcl] Other (See Comments)   Hyperactivity      Medication List    STOP taking these medications   amoxicillin 500 MG capsule Commonly known as:  AMOXIL   metroNIDAZOLE 500 MG tablet Commonly known as:  FLAGYL   omeprazole 20 MG capsule Commonly known as:  PRILOSEC   oxyCODONE-acetaminophen 5-325 MG tablet Commonly known as:  PERCOCET/ROXICET     TAKE these medications   amphetamine-dextroamphetamine 20 MG tablet Commonly known as:  ADDERALL Take 20 mg by mouth 3 (three) times daily.   cyclobenzaprine 10 MG tablet Commonly known as:  FLEXERIL Take 1 tablet (10 mg total) by mouth 3 (three) times daily as needed for muscle spasms.   Hydrocodone-Acetaminophen 5-300 MG Tabs Commonly known as:  VICODIN Take 1 tablet by mouth every 6 (six) hours as needed.   ondansetron 4 MG tablet Commonly known as:  ZOFRAN Take 1 tablet (4 mg total) by mouth every 6 (six) hours as needed for nausea.   pantoprazole 40 MG tablet Commonly known as:  PROTONIX Take 1 tablet (40 mg total) by mouth 2 (two) times daily.   VENTOLIN HFA 108 (90 Base) MCG/ACT inhaler Generic drug:  albuterol Inhale 2 puffs into the lungs every 4 (four) hours as needed for wheezing or shortness of breath.         Diet and Activity recommendation: See Discharge Instructions above   Consults obtained - [facility]    Major procedures and  Radiology Reports - PLEASE review detailed and final reports for all details, in brief -   EGD on 04/30/17   Ct Angio Chest Pe W Or Wo Contrast  Result Date: 04/27/2017 CLINICAL DATA:  Uterine cancer, prior calf DVT. Atypical chest pain with mildly  elevated D-dimer. EXAM: CT ANGIOGRAPHY CHEST WITH CONTRAST TECHNIQUE: Multidetector CT imaging of the chest was performed using the standard protocol during bolus administration of intravenous contrast. Multiplanar CT image reconstructions and MIPs were obtained to evaluate the vascular anatomy. CONTRAST:  100 cc Isovue 370 IV COMPARISON:  CXR from 04/25/2017 and chest CT 10/10/2010 FINDINGS: Cardiovascular: The study is of quality for the evaluation of pulmonary embolism. There are no filling defects in the central, lobar, segmental or subsegmental pulmonary artery branches to suggest acute pulmonary embolism. Great vessels are normal in course and caliber. Normal heart size. No significant pericardial fluid/thickening. Mediastinum/Nodes: No discrete thyroid nodules. Unremarkable esophagus. No pathologically enlarged axillary, mediastinal or hilar lymph nodes. Lungs/Pleura: No pneumothorax. No pleural effusion. Previously noted left lower lobe 5 mm nodule is smaller and 4 mm currently. The nodular density in the periphery of the right lower lobe appears to represent a branch point for pulmonary vessel. No additional nodules or masses. No pneumonic consolidations. Upper abdomen: No acute abnormality Musculoskeletal:  No aggressive appearing focal osseous lesions. Review of the MIP images confirms the above findings. IMPRESSION: 1. No acute pulmonary consolidation, aortic aneurysm nor dissection. 2. 4 mm smaller left lower lobe nodular density versus 5 mm previously in 2011 consistent with a benign finding. 3. No acute pneumonic consolidation, CHF, effusion or pneumothorax. Electronically Signed   By: Ashley Royalty M.D.   On: 04/27/2017 13:58   Ct Abdomen Pelvis W  Contrast  Result Date: 04/25/2017 CLINICAL DATA:  Nausea and vomiting since last night. Generalized abdominal pain. History uterine cancer and c difficile colitis. Cholecystectomy. Appendectomy. EXAM: CT ABDOMEN AND PELVIS WITH CONTRAST TECHNIQUE: Multidetector CT imaging of the abdomen and pelvis was performed using the standard protocol following bolus administration of intravenous contrast. CONTRAST:  13mL ISOVUE-300 IOPAMIDOL (ISOVUE-300) INJECTION 61% COMPARISON:  02/27/2017 FINDINGS: Lower chest: Clear lung bases. Normal heart size without pericardial or pleural effusion. Tiny hiatal hernia. Hepatobiliary: Mild hepatic steatosis, without focal liver lesion. Normal gallbladder, without biliary ductal dilatation. Pancreas: Fatty replacement involving the pancreatic head and uncinate process. No duct dilatation or dominant mass. Spleen: Normal in size, without focal abnormality. Adrenals/Urinary Tract: Normal adrenal glands. Normal kidneys, without hydronephrosis. Normal urinary bladder. Stomach/Bowel: Normal remainder of the stomach. Decompressed colon. Normal terminal ileum. Appendectomy. Proximal small bowel is also collapsed. Given this mild limitation, no evidence of small bowel pathology. Vascular/Lymphatic: Normal caliber of the aorta and branch vessels. No retroperitoneal or retrocrural adenopathy. Pelvic node dissection, without sidewall adenopathy. Reproductive: Hysterectomy.  No adnexal mass. Other: No significant free fluid. No evidence of omental or peritoneal disease. Musculoskeletal: No acute osseous abnormality. IMPRESSION: 1.  No acute process in the abdomen or pelvis. 2. Hepatic steatosis. 3. Hysterectomy, without evidence of metastatic disease. Electronically Signed   By: Abigail Miyamoto M.D.   On: 04/25/2017 16:45   Dg Chest Portable 1 View  Result Date: 04/25/2017 CLINICAL DATA:  Chest pain and tingling beginning last night. Vomiting. EXAM: PORTABLE CHEST 1 VIEW COMPARISON:  PA and lateral  chest 02/12/2015. FINDINGS: The lungs are clear. Heart size is normal. No pneumothorax or pleural effusion. No bony abnormality. IMPRESSION: Negative chest. Electronically Signed   By: Inge Rise M.D.   On: 04/25/2017 15:06   Dg Duanne Limerick  W/kub  Result Date: 04/28/2017 CLINICAL DATA:  Chest pain, dysphagia, nausea, vomiting, epigastric pain. History of reflux. EXAM: UPPER GI SERIES WITH KUB TECHNIQUE: After obtaining a scout radiograph a routine upper GI series was performed using thin  and high density barium. FLUOROSCOPY TIME:  Fluoroscopy Time:  2 minutes Radiation Exposure Index (if provided by the fluoroscopic device): Number of Acquired Spot Images: 0 COMPARISON:  CT 04/25/2017 FINDINGS: Scout film demonstrates changes of prior cholecystectomy. No evidence of bowel obstruction organomegaly. Fluoroscopic evaluation of swallowing demonstrates normal esophageal peristalsis. No fixed stricture, fold thickening or mass. No reflux with the water siphon maneuver. Small hiatal hernia. The patient swallowed a 13 mm barium tablet which freely passed into the stomach. Stomach, duodenal bulb and duodenal sweep are unremarkable. No ulceration, fold thickening or mass. IMPRESSION: Small hiatal hernia.  Otherwise unremarkable study. Electronically Signed   By: Rolm Baptise M.D.   On: 04/28/2017 16:38    Micro Results     Recent Results (from the past 240 hour(s))  C difficile quick scan w PCR reflex     Status: None   Collection Time: 04/28/17 11:57 PM  Result Value Ref Range Status   C Diff antigen NEGATIVE NEGATIVE Final   C Diff toxin NEGATIVE NEGATIVE Final   C Diff interpretation No C. difficile detected.  Final       Today   Subjective:   [name] Horn today has no headache,no chest  pain,no new weakness tingling or numbness, Tolerating soft diet with no nausea, vomiting, reports abdominal pain still presence, but improving ,feels much better wants to go home today.   Objective:   Blood  pressure (!) 100/54, pulse 63, temperature 97.8 F (36.6 C), temperature source Oral, resp. rate 16, height 5\' 4"  (1.626 m), weight 72.4 kg (159 lb 9.8 oz), SpO2 100 %.   Intake/Output Summary (Last 24 hours) at 05/02/17 1221 Last data filed at 05/02/17 0900  Gross per 24 hour  Intake             1020 ml  Output                0 ml  Net             1020 ml    Exam Awake Alert, Oriented x 3, No new F.N deficits, Normal affect Symmetrical Chest wall movement, Good air movement bilaterally, CTAB RRR,No Gallops,Rubs or new Murmurs, No Parasternal Heave +ve B.Sounds, Abd Soft, Minimal tenderness in epigastric area, No rebound -guarding or rigidity. No Cyanosis, Clubbing or edema, No new Rash or bruise  Data Review   CBC w Diff: Lab Results  Component Value Date   WBC 9.5 04/26/2017   HGB 12.1 04/26/2017   HCT 37.2 04/26/2017   PLT 250 04/26/2017   LYMPHOPCT 38 04/26/2017   MONOPCT 5 04/26/2017   EOSPCT 0 04/26/2017   BASOPCT 0 04/26/2017    CMP: Lab Results  Component Value Date   NA 138 04/30/2017   K 4.1 04/30/2017   CL 104 04/30/2017   CO2 24 04/30/2017   BUN 6 04/30/2017   CREATININE 0.82 04/30/2017   PROT 5.9 (L) 04/26/2017   ALBUMIN 3.7 04/26/2017   BILITOT 0.7 04/26/2017   ALKPHOS 64 04/26/2017   AST 24 04/26/2017   ALT 22 04/26/2017  .   Total Time in preparing paper work, data evaluation and todays exam - 35 minutes  Beila Purdie M.D on 05/02/2017 at [facility]   Office  [phone]

## 2017-05-02 NOTE — OR Nursing (Signed)
[facility] Progress Note  Subjective: Patient still little nausea and chest discomfort but overall better on twice a day Protonix, held down diet yesterday  Objective: Vital signs in last 24 hours: Temp:  [97.8 F (36.6 C)-98.4 F (36.9 C)] 97.8 F (36.6 C) (05/08 0529) Pulse Rate:  [63-80] 63 (05/08 0529) Resp:  [16-20] 16 (05/08 0529) BP: (100-105)/(54-62) 100/54 (05/08 0529) SpO2:  [98 %-100 %] 100 % (05/08 0529) @WEIGHTCHANGE @   PE: Unchanged  Lab Results: No results found for this or any previous visit (from the past 24 hour(s)).  Studies/Results: No results found.    Assessment: Presumed gastroesophageal reflux/rule out eosinophilic esophagitis or gastroenteritis, path still pending Subtle lower esophageal ring status post dilatation  Plan: Okay for discharge from GI standpoint. Recommend twice a day Protonix and when necessary Zofran. I will follow-up on biopsies and see in the office.    Ariellah Faust C 05/02/2017, 11:59 AM  Pager [phone] If no answer or after 5 PM call [phone]

## 2017-05-02 NOTE — Progress Notes (Signed)
[name] Horn to be D/C'd Home per MD order.  Discussed with the patient and all questions fully answered.  VSS, Skin clean, dry and intact without evidence of skin break down, no evidence of skin tears noted. IV catheter discontinued intact. Site without signs and symptoms of complications. Dressing and pressure applied.  An After Visit Summary was printed and given to the patient. Patient received prescription.  D/c education completed with patient/family including follow up instructions, medication list, d/c activities limitations if indicated, with other d/c instructions as indicated by MD - patient able to verbalize understanding, all questions fully answered.   Patient instructed to return to ED, call 911, or call MD for any changes in condition.   Patient escorted via [place], and D/C home via private auto.  Christoper Fabian [name] 05/02/2017 1:16 PM

## 2017-05-29 NOTE — Addendum Note (Signed)
Addendum  created 05/29/17 1421 by Myrtie Soman, MD   Sign clinical note

## 2017-11-12 ENCOUNTER — Other Ambulatory Visit: Payer: Self-pay

## 2017-11-12 ENCOUNTER — Emergency Department (HOSPITAL_COMMUNITY): Payer: BLUE CROSS/BLUE SHIELD

## 2017-11-12 ENCOUNTER — Emergency Department (HOSPITAL_COMMUNITY)
Admission: EM | Admit: 2017-11-12 | Discharge: 2017-11-12 | Disposition: A | Discharge status: Home / Self Care | Payer: BLUE CROSS/BLUE SHIELD | Attending: Emergency Medicine | Admitting: Emergency Medicine

## 2017-11-12 DIAGNOSIS — R112 Nausea with vomiting, unspecified: Secondary | ICD-10-CM | POA: Diagnosis not present

## 2017-11-12 DIAGNOSIS — Y9389 Activity, other specified: Secondary | ICD-10-CM | POA: Diagnosis not present

## 2017-11-12 DIAGNOSIS — Z8542 Personal history of malignant neoplasm of other parts of uterus: Secondary | ICD-10-CM | POA: Insufficient documentation

## 2017-11-12 DIAGNOSIS — R197 Diarrhea, unspecified: Secondary | ICD-10-CM | POA: Insufficient documentation

## 2017-11-12 DIAGNOSIS — M25552 Pain in left hip: Secondary | ICD-10-CM | POA: Insufficient documentation

## 2017-11-12 DIAGNOSIS — M542 Cervicalgia: Secondary | ICD-10-CM | POA: Insufficient documentation

## 2017-11-12 DIAGNOSIS — R55 Syncope and collapse: Secondary | ICD-10-CM | POA: Insufficient documentation

## 2017-11-12 DIAGNOSIS — Y92410 Unspecified street and highway as the place of occurrence of the external cause: Secondary | ICD-10-CM | POA: Insufficient documentation

## 2017-11-12 DIAGNOSIS — Y999 Unspecified external cause status: Secondary | ICD-10-CM | POA: Diagnosis not present

## 2017-11-12 DIAGNOSIS — Z79899 Other long term (current) drug therapy: Secondary | ICD-10-CM | POA: Diagnosis not present

## 2017-11-12 DIAGNOSIS — R109 Unspecified abdominal pain: Secondary | ICD-10-CM | POA: Diagnosis not present

## 2017-11-12 LAB — BASIC METABOLIC PANEL
Anion gap: 8 (ref 5–15)
BUN: 9 mg/dL (ref 6–20)
CALCIUM: 9.4 mg/dL (ref 8.9–10.3)
CO2: 27 mmol/L (ref 22–32)
CREATININE: 0.89 mg/dL (ref 0.44–1.00)
Chloride: 102 mmol/L (ref 101–111)
Glucose, Bld: 98 mg/dL (ref 65–99)
Potassium: 4.1 mmol/L (ref 3.5–5.1)
SODIUM: 137 mmol/L (ref 135–145)

## 2017-11-12 LAB — CBC
HCT: 37.2 % (ref 36.0–46.0)
Hemoglobin: 12.7 g/dL (ref 12.0–15.0)
MCH: 31.1 pg (ref 26.0–34.0)
MCHC: 34.1 g/dL (ref 30.0–36.0)
MCV: 91 fL (ref 78.0–100.0)
Platelets: 284 10*3/uL (ref 150–400)
RBC: 4.09 MIL/uL (ref 3.87–5.11)
RDW: 13 % (ref 11.5–15.5)
WBC: 6.7 10*3/uL (ref 4.0–10.5)

## 2017-11-12 LAB — CBG MONITORING, ED: Glucose-Capillary: 100 mg/dL — ABNORMAL HIGH (ref 65–99)

## 2017-11-12 MED ORDER — HYDROCODONE-ACETAMINOPHEN 5-325 MG PO TABS
2.0000 | ORAL_TABLET | Freq: Once | ORAL | Status: AC
  Administered 2017-11-12: 2 via ORAL
  Filled 2017-11-12: qty 2

## 2017-11-12 MED ORDER — HYDROCODONE-ACETAMINOPHEN 5-325 MG PO TABS: 2.0000 | ORAL_TABLET | Freq: Four times a day (QID) | ORAL | 0 refills | Status: DC | PRN

## 2017-11-12 MED ORDER — FENTANYL CITRATE (PF) 100 MCG/2ML IJ SOLN
50.0000 ug | Freq: Once | INTRAMUSCULAR | Status: AC
  Administered 2017-11-12: 50 ug via INTRAVENOUS
  Filled 2017-11-12: qty 2

## 2017-11-12 MED ORDER — CYCLOBENZAPRINE HCL 10 MG PO TABS: 10.0000 mg | ORAL_TABLET | Freq: Two times a day (BID) | ORAL | 0 refills | Status: DC | PRN

## 2017-11-12 MED ORDER — IBUPROFEN 800 MG PO TABS: 800.0000 mg | ORAL_TABLET | Freq: Three times a day (TID) | ORAL | 0 refills | Status: DC

## 2017-11-12 MED ORDER — SODIUM CHLORIDE 0.9 % IV BOLUS (SEPSIS)
1000.0000 mL | Freq: Once | INTRAVENOUS | Status: AC
  Administered 2017-11-12: 1000 mL via INTRAVENOUS

## 2017-11-12 MED ORDER — IOPAMIDOL (ISOVUE-300) INJECTION 61%
INTRAVENOUS | Status: AC
  Administered 2017-11-12: 100 mL
  Filled 2017-11-12: qty 100

## 2017-11-12 NOTE — ED Notes (Signed)
Pt and family member understood dc material. NAD noted. Scripts given at [employer] along with a work excuse
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
## 2018-03-06 ENCOUNTER — Other Ambulatory Visit (HOSPITAL_COMMUNITY): Payer: Self-pay | Admitting: Gastroenterology

## 2018-03-06 DIAGNOSIS — R1310 Dysphagia, unspecified: Secondary | ICD-10-CM

## 2018-03-06 DIAGNOSIS — R1319 Other dysphagia: Secondary | ICD-10-CM

## 2018-03-20 ENCOUNTER — Ambulatory Visit (HOSPITAL_COMMUNITY)
Admission: RE | Admit: 2018-03-20 | Discharge: 2018-03-20 | Disposition: A | Discharge status: Home / Self Care | Payer: Managed Care, Other (non HMO) | Source: Ambulatory Visit | Attending: Gastroenterology | Admitting: Gastroenterology

## 2018-03-20 DIAGNOSIS — R1319 Other dysphagia: Secondary | ICD-10-CM | POA: Insufficient documentation

## 2018-03-20 DIAGNOSIS — K30 Functional dyspepsia: Secondary | ICD-10-CM | POA: Insufficient documentation

## 2018-03-20 DIAGNOSIS — R1310 Dysphagia, unspecified: Secondary | ICD-10-CM | POA: Diagnosis present

## 2018-03-20 MED ORDER — TECHNETIUM TC 99M SULFUR COLLOID
2.0000 | Freq: Once | INTRAVENOUS | Status: AC | PRN
  Administered 2018-03-20: 2 via ORAL

## 2018-04-10 DIAGNOSIS — R002 Palpitations: Secondary | ICD-10-CM | POA: Insufficient documentation

## 2019-04-03 DIAGNOSIS — K3184 Gastroparesis: Secondary | ICD-10-CM | POA: Diagnosis present

## 2019-06-03 ENCOUNTER — Other Ambulatory Visit: Payer: Self-pay

## 2019-06-03 ENCOUNTER — Emergency Department (HOSPITAL_COMMUNITY)
Admission: EM | Admit: 2019-06-03 | Discharge: 2019-06-04 | Disposition: A | Discharge status: Home / Self Care | Payer: Managed Care, Other (non HMO) | Attending: Emergency Medicine | Admitting: Emergency Medicine

## 2019-06-03 ENCOUNTER — Encounter (HOSPITAL_COMMUNITY): Payer: Self-pay | Admitting: Emergency Medicine

## 2019-06-03 DIAGNOSIS — Z8541 Personal history of malignant neoplasm of cervix uteri: Secondary | ICD-10-CM | POA: Diagnosis not present

## 2019-06-03 DIAGNOSIS — R2243 Localized swelling, mass and lump, lower limb, bilateral: Secondary | ICD-10-CM | POA: Insufficient documentation

## 2019-06-03 DIAGNOSIS — Z79899 Other long term (current) drug therapy: Secondary | ICD-10-CM | POA: Insufficient documentation

## 2019-06-03 DIAGNOSIS — R600 Localized edema: Secondary | ICD-10-CM

## 2019-06-03 LAB — CBC
HCT: 36.6 % (ref 36.0–46.0)
Hemoglobin: 11.9 g/dL — ABNORMAL LOW (ref 12.0–15.0)
MCH: 31 pg (ref 26.0–34.0)
MCHC: 32.5 g/dL (ref 30.0–36.0)
MCV: 95.3 fL (ref 80.0–100.0)
Platelets: 286 10*3/uL (ref 150–400)
RBC: 3.84 MIL/uL — ABNORMAL LOW (ref 3.87–5.11)
RDW: 13.7 % (ref 11.5–15.5)
WBC: 6 10*3/uL (ref 4.0–10.5)
nRBC: 0 % (ref 0.0–0.2)

## 2019-06-03 LAB — COMPREHENSIVE METABOLIC PANEL
ALT: 17 U/L (ref 0–44)
AST: 20 U/L (ref 15–41)
Albumin: 4.1 g/dL (ref 3.5–5.0)
Alkaline Phosphatase: 79 U/L (ref 38–126)
Anion gap: 9 (ref 5–15)
BUN: 14 mg/dL (ref 6–20)
CO2: 23 mmol/L (ref 22–32)
Calcium: 9.3 mg/dL (ref 8.9–10.3)
Chloride: 108 mmol/L (ref 98–111)
Creatinine, Ser: 0.96 mg/dL (ref 0.44–1.00)
GFR calc Af Amer: >60 mL/min (ref >60)
GFR calc non Af Amer: >60 mL/min (ref >60)
Glucose, Bld: 94 mg/dL (ref 70–99)
Potassium: 4.1 mmol/L (ref 3.5–5.1)
Sodium: 140 mmol/L (ref 135–145)
Total Bilirubin: 0.4 mg/dL (ref 0.3–1.2)
Total Protein: 6.6 g/dL (ref 6.5–8.1)

## 2019-06-03 NOTE — ED Triage Notes (Addendum)
Pt has hx of DVT. Has bilateral swelling of lower extremities.  Pain in left leg below knee which is where her previous DVT was. No current blood thinners. Pt states she has also had several tick bites recently

## 2019-06-04 ENCOUNTER — Other Ambulatory Visit: Payer: Self-pay

## 2019-06-04 ENCOUNTER — Emergency Department (HOSPITAL_COMMUNITY): Payer: Managed Care, Other (non HMO)

## 2019-06-04 ENCOUNTER — Emergency Department (HOSPITAL_BASED_OUTPATIENT_CLINIC_OR_DEPARTMENT_OTHER): Payer: Managed Care, Other (non HMO)

## 2019-06-04 DIAGNOSIS — M7989 Other specified soft tissue disorders: Secondary | ICD-10-CM

## 2019-06-04 DIAGNOSIS — I82409 Acute embolism and thrombosis of unspecified deep veins of unspecified lower extremity: Secondary | ICD-10-CM | POA: Diagnosis not present

## 2019-06-04 LAB — BRAIN NATRIURETIC PEPTIDE: B Natriuretic Peptide: 32.7 pg/mL (ref 0.0–100.0)

## 2019-06-04 MED ORDER — ONDANSETRON HCL 4 MG/2ML IJ SOLN
4.0000 mg | Freq: Once | INTRAMUSCULAR | Status: AC
  Administered 2019-06-04: 4 mg via INTRAVENOUS
  Filled 2019-06-04: qty 2

## 2019-06-04 MED ORDER — MORPHINE SULFATE (PF) 4 MG/ML IV SOLN
4.0000 mg | Freq: Once | INTRAVENOUS | Status: AC
  Administered 2019-06-04: 04:00:00 4 mg via INTRAVENOUS
  Filled 2019-06-04: qty 1

## 2019-06-04 NOTE — Discharge Instructions (Addendum)
Elevate your legs  Reduce your salt intake  Purchase compression stockings at the pharmacy to help with swelling in your legs  Please call your primary care team for follow-up

## 2019-06-04 NOTE — ED Provider Notes (Signed)
[facility] [facility] EMERGENCY DEPARTMENT Provider Note   CSN: [id number] Arrival date & time: 06/03/19  1912  History   Chief Complaint Chief Complaint  Patient presents with  . Leg Swelling   HPI [name] Horn is a 43 y.o. female with past history significant for DVT, not on anticoagulation, gastroparesis, chronic nausea, who presents for evaluation of bilateral lower extremity swelling. Symptoms onset Saturday 3 days PTA. States she has recently gained 30 pounds over the last 6  Months. Denies hx of CHF, CP, SOB, hemoptysis, abd pain, dysuria, constipation, dizziness, weakness. Has had 2 tick bites over the last month. Has pain to there LLE in the posterior calf. This is the location where she had her previous DVT. Denies recent injury or trauma. Denies fever, chills, N/V,  IVDU, redness, swelling, warmth to her extremities. States that she feels like her LLE extremity will intermittently become "tingly" when her swelling is at its worst. Has been trying to elevated her legs at home with minor resolve. No IVDU or hx of similar symptoms. Has been ambulatory without difficulty. Has had recent tick bites however without joint swelling or pain.  History obtained from patient. No interpretor was used.     HPI  Past Medical History:  Diagnosis Date  . DVT (deep venous thrombosis) ([place])    x2  . Uterine cancer [facility])     Patient Active Problem List   Diagnosis Date Noted  . Nausea and vomiting 04/26/2017  . Nausea & vomiting 04/25/2017  . Chest pain 04/25/2017  . ANKLE, PAIN 11/24/2010  . ANXIETY 10/11/2010  . DVT 10/11/2010  . COSTOCHONDRITIS 03/03/2008  . CELLULITIS, GREAT TOE 12/01/2007  . SINUSITIS, ACUTE NOS 09/25/2007  . MENOPAUSE, PREMATURE 07/20/2007  . UTERINE CANCER, HX OF 07/16/2007  . Other postprocedural status(V45.89) 07/16/2007    Past Surgical History:  Procedure Laterality Date  . APPENDECTOMY    . CHOLECYSTECTOMY    . ESOPHAGOGASTRODUODENOSCOPY  N/A 04/30/2017   Procedure: ESOPHAGOGASTRODUODENOSCOPY (EGD);  Surgeon: Teena Irani, MD;  Location: [facility] ENDOSCOPY;  Service: Endoscopy;  Laterality: N/A;  . KNEE SURGERY     x3  . MINIMALLY INVASIVE FORAMINOTOMY CERVICAL SPINE     C6-T1, Nitka  . SAVORY DILATION N/A 04/30/2017   Procedure: SAVORY DILATION;  Surgeon: Teena Irani, MD;  Location: [facility] ENDOSCOPY;  Service: Endoscopy;  Laterality: N/A;  . TONSILLECTOMY    . TOTAL ABDOMINAL HYSTERECTOMY     Sarcoma, s/p XRT     OB History   No obstetric history on file.      Home Medications    Prior to Admission medications   Medication Sig Start Date End Date Taking? Authorizing Provider  amphetamine-dextroamphetamine (ADDERALL) 20 MG tablet Take 20 mg by mouth 3 (three) times daily.    [provider]  cyclobenzaprine (FLEXERIL) 10 MG tablet Take 1 tablet (10 mg total) 2 (two) times daily as needed by mouth for muscle spasms. 11/12/17   Montine Circle, PA-C  HYDROcodone-acetaminophen (NORCO/VICODIN) 5-325 MG tablet Take 2 tablets every 6 (six) hours as needed by mouth. 11/12/17   Montine Circle, PA-C  hyoscyamine (LEVSIN, ANASPAZ) 0.125 MG tablet Take 0.125 mg every 4 (four) hours as needed by mouth. 07/25/17   [provider]  ibuprofen (ADVIL,MOTRIN) 800 MG tablet Take 1 tablet (800 mg total) 3 (three) times daily by mouth. 11/12/17   Montine Circle, PA-C  pantoprazole (PROTONIX) 40 MG tablet Take 1 tablet (40 mg total) by mouth 2 (two) times daily.  05/02/17 05/02/18  Elgergawy, Silver Huguenin, MD  PRESCRIPTION MEDICATION probiotic    [provider]    Family History Family History  Problem Relation Age of Onset  . Hypertension Other   . Thyroid disease Other   . Hyperlipidemia Other   . Colon cancer Other        grandmother   Social History Social History   Tobacco Use  . Smoking status: Never Smoker  . Smokeless tobacco: Never Used  Substance Use Topics  . Alcohol use: Yes  . Drug use: No    Allergies   Sulfa antibiotics; Sulfonamide derivatives; and Benadryl [diphenhydramine hcl]  Review of Systems Review of Systems  Constitutional: Negative.   HENT: Negative.   Respiratory: Negative.   Cardiovascular: Positive for leg swelling. Negative for chest pain and palpitations.  Gastrointestinal: Negative.   Genitourinary: Negative.   Musculoskeletal: Negative.   Skin: Negative.   Neurological: Negative.   All other systems reviewed and are negative.  Physical Exam Updated Vital Signs BP (!) 94/54   Pulse 71   Temp 98.2 F (36.8 C) (Oral)   Resp 15   Ht 5\' 4"  (1.626 m)   Wt 77.1 kg   SpO2 97%   BMI 29.18 kg/m   Physical Exam Vitals signs and nursing note reviewed.  Constitutional:      General: She is not in acute distress.    Appearance: She is well-developed. She is not ill-appearing, toxic-appearing or diaphoretic.  HENT:     Head: Atraumatic.     Nose: Nose normal.     Mouth/Throat:     Mouth: Mucous membranes are moist.     Pharynx: Oropharynx is clear.  Eyes:     Pupils: Pupils are equal, round, and reactive to light.  Neck:     Musculoskeletal: Normal range of motion and neck supple. No neck rigidity.  Cardiovascular:     Rate and Rhythm: Normal rate.     Pulses: Normal pulses.          Dorsalis pedis pulses are 2+ on the right side and 2+ on the left side.       Posterior tibial pulses are 2+ on the right side and 2+ on the left side.     Heart sounds: Normal heart sounds. No murmur. No gallop.   Pulmonary:     Effort: Pulmonary effort is normal. No respiratory distress.     Breath sounds: Normal breath sounds. No stridor. No wheezing, rhonchi or rales.  Chest:     Chest wall: No tenderness.  Abdominal:     General: Bowel sounds are normal. There is distension.     Tenderness: There is no abdominal tenderness. There is no right CVA tenderness, left CVA tenderness, guarding or rebound.     Hernia: No hernia is present.  Musculoskeletal: Normal  range of motion.        General: No swelling, tenderness, deformity or signs of injury.     Right knee: Normal.     Left knee: Normal.     Right ankle: Normal.     Left ankle: Normal.     Right lower leg: Edema present.     Left lower leg: Edema present.     Right foot: Normal. Normal range of motion. No deformity.     Left foot: Normal. Normal range of motion. No deformity.     Comments: Trace pitting edema to bilateral lower extremities to mid shin. Full ROM without difficulty. No bony tenderness.Tenderness  to popliteal fossa to LLE and to posterior calf. 2+ DP, PT pulses bilaterally.   Feet:     Right foot:     Protective Sensation: 2 sites tested. 2 sites sensed.     Skin integrity: Skin integrity normal.     Left foot:     Protective Sensation: 2 sites tested. 2 sites sensed.     Skin integrity: Skin integrity normal.  Lymphadenopathy:     Cervical: No cervical adenopathy.  Skin:    General: Skin is warm and dry.     Capillary Refill: Capillary refill takes less than 2 seconds.     Comments: No rashes, target lesions, erythema, warmth.  Neurological:     Mental Status: She is alert.      ED Treatments / Results  Labs (all labs ordered are listed, but only abnormal results are displayed) Labs Reviewed  CBC - Abnormal; Notable for the following components:      Result Value   RBC 3.84 (*)    Hemoglobin 11.9 (*)    All other components within normal limits  COMPREHENSIVE METABOLIC PANEL  BRAIN NATRIURETIC PEPTIDE    EKG None  Radiology Dg Chest 2 View  Result Date: 06/04/2019 CLINICAL DATA:  Leg swelling.  Personal history of DVT. EXAM: CHEST - 2 VIEW COMPARISON:  CT of the chest 11/12/2017 FINDINGS: The heart size and mediastinal contours are within normal limits. Both lungs are clear. The visualized skeletal structures are unremarkable. Loop recorder is in place. Surgical clips are present in the gallbladder fossa. IMPRESSION: No active cardiopulmonary disease.  Electronically Signed   By: San Morelle M.D.   On: 06/04/2019 05:17    Procedures Procedures (including critical care time)  Medications Ordered in ED Medications  morphine 4 MG/ML injection 4 mg (4 mg Intravenous Given 06/04/19 0405)  ondansetron (ZOFRAN) injection 4 mg (4 mg Intravenous Given 06/04/19 0403)   Initial Impression / Assessment and Plan / ED Course  I have reviewed the triage vital signs and the nursing notes.  Pertinent labs & imaging results that were available during my care of the patient were reviewed by me and considered in my medical decision making (see chart for details).  86 old female appears otherwise well presents for evaluation of bilateral lower extremity swelling.  Symptoms began 3 days PTA. Has any recent injuries or trauma.  Does have history of a DVT to left lower extremity.  She does have tenderness palpation to popliteal fossa to left lower extremity as well as to posterior calf.  She does have 1+ pitting edema to bilateral lower extremities without erythema or warmth.  Normal musculoskeletal exam.  Neurovascularly intact. She has recently gained 30 lbs over the last 6 months. No CP, SOB, hemoptysis.  Did not currently have ultrasound available to rule out DVT on her left lower extremity.  She is without tachycardia, tachypnea or hypoxia, I have low suspicion for PE.  Will obtain labs, chest x-ray to r/o acute CHF as cause of her lower extremity swelling. Low suspicion for septic joint, septic arthritis, gout, hemarthrosis, cellulitis, abscess. No target lesions to suggest Lyme disease.  Labs and imagine personally reviewed CBC without leukocytosis CMP without electrolyte, renal or liver abnormalities. BNP 32.7 Chest xray without cardiomegaly, pulmonary edema, infiltrates Given history of DVT obtain ultrasound to rule out blood clot.  If no DVT can dc home on Doxy for tick bites and 3-4 days of lasix to see if this helps with her swelling. Low  suspicion for infectious process or cardiac cause of her swelling.  Care transferred to Dr. Venora Maples who will follow up on US DVT study. He will determine ultimate treatment, plan and disposition.   Patient has been seen and evaluated by my attending Dr. Wyvonnia Dusky who agrees with above treatment, plan and disposition.   Final Clinical Impressions(s) / ED Diagnoses   Final diagnoses:  Leg edema    ED Discharge Orders    None       Devin Foskey A, PA-C 06/04/19 0706    Ezequiel Essex, MD 06/04/19 [phone]

## 2019-06-04 NOTE — Progress Notes (Signed)
BLE venous duplex       has been completed. Preliminary results can be found under CV proc through chart review. June Leap, BS, RDMS, RVT

## 2019-06-04 NOTE — ED Notes (Signed)
Patient verbalized understanding of dc instructions, vss, ambulatory with nad.   

## 2019-06-04 NOTE — ED Provider Notes (Signed)
Bilateral venous duplex negative for DVT.  Discharged home with primary care follow-up.  Peripheral edema.  Recommend elevation, compression stockings, salt restriction, primary care follow-up.   Jola Schmidt, MD 06/04/19 [phone]

## 2019-07-30 NOTE — Progress Notes (Deleted)
Office Visit Note  Patient: [name] Horn             Date of Birth: Feb 02, 1976           MRN: [id number]             PCP: Burnadette Peter, MD Referring: Warden Fillers, MD Visit Date: 08/12/2019 Occupation: @GUAROCC @  Subjective:  No chief complaint on file.   History of Present Illness: [name] Horn is a 43 y.o. female ***   Activities of Daily Living:  Patient reports morning stiffness for *** {minute/hour:19697}.   Patient {ACTIONS;DENIES/REPORTS:21021675::"Denies"} nocturnal pain.  Difficulty dressing/grooming: {ACTIONS;DENIES/REPORTS:21021675::"Denies"} Difficulty climbing stairs: {ACTIONS;DENIES/REPORTS:21021675::"Denies"} Difficulty getting out of chair: {ACTIONS;DENIES/REPORTS:21021675::"Denies"} Difficulty using hands for taps, buttons, cutlery, and/or writing: {ACTIONS;DENIES/REPORTS:21021675::"Denies"}  No Rheumatology ROS completed.   PMFS History:  Patient Active Problem List   Diagnosis Date Noted  . Nausea and vomiting 04/26/2017  . Nausea & vomiting 04/25/2017  . Chest pain 04/25/2017  . ANKLE, PAIN 11/24/2010  . ANXIETY 10/11/2010  . DVT 10/11/2010  . COSTOCHONDRITIS 03/03/2008  . CELLULITIS, GREAT TOE 12/01/2007  . SINUSITIS, ACUTE NOS 09/25/2007  . MENOPAUSE, PREMATURE 07/20/2007  . UTERINE CANCER, HX OF 07/16/2007  . Other postprocedural status(V45.89) 07/16/2007    Past Medical History:  Diagnosis Date  . DVT (deep venous thrombosis) ([place])    x2  . Uterine cancer ([place])     Family History  Problem Relation Age of Onset  . Hypertension Other   . Thyroid disease Other   . Hyperlipidemia Other   . Colon cancer Other        grandmother   Past Surgical History:  Procedure Laterality Date  . APPENDECTOMY    . CHOLECYSTECTOMY    . ESOPHAGOGASTRODUODENOSCOPY N/A 04/30/2017   Procedure: ESOPHAGOGASTRODUODENOSCOPY (EGD);  Surgeon: Teena Irani, MD;  Location: [facility] ENDOSCOPY;  Service: Endoscopy;  Laterality: N/A;  . KNEE SURGERY     x3  . MINIMALLY INVASIVE FORAMINOTOMY CERVICAL SPINE     C6-T1, Nitka  . SAVORY DILATION N/A 04/30/2017   Procedure: SAVORY DILATION;  Surgeon: Teena Irani, MD;  Location: [facility] ENDOSCOPY;  Service: Endoscopy;  Laterality: N/A;  . TONSILLECTOMY    . TOTAL ABDOMINAL HYSTERECTOMY     Sarcoma, s/p XRT   Social History   Social History Narrative  . Not on file    There is no immunization history on file for this patient.   Objective: Vital Signs: There were no vitals taken for this visit.   Physical Exam   Musculoskeletal Exam: ***  CDAI Exam: CDAI Score: - Patient Global: -; Provider Global: - Swollen: -; Tender: - Joint Exam   No joint exam has been documented for this visit   There is currently no information documented on the homunculus. Go to the Rheumatology activity and complete the homunculus joint exam.  Investigation: No additional findings.  Imaging: No results found.  Recent Labs: Lab Results  Component Value Date   WBC 6.0 06/03/2019   HGB 11.9 (L) 06/03/2019   PLT 286 06/03/2019   NA 140 06/03/2019   K 4.1 06/03/2019   CL 108 06/03/2019   CO2 23 06/03/2019   GLUCOSE 94 06/03/2019   BUN 14 06/03/2019   CREATININE 0.96 06/03/2019   BILITOT 0.4 06/03/2019   ALKPHOS 79 06/03/2019   AST 20 06/03/2019   ALT 17 06/03/2019   PROT 6.6 06/03/2019   ALBUMIN 4.1 06/03/2019   CALCIUM 9.3 06/03/2019   GFRAA >60 06/03/2019  Speciality Comments: No specialty comments available.  Procedures:  No procedures performed Allergies: Sulfa antibiotics, Sulfonamide derivatives, and Benadryl [diphenhydramine hcl]   Assessment / Plan:     Visit Diagnoses: Conjunctivitis, unspecified conjunctivitis type, unspecified laterality - requesting workup for autoimmune conjunctivitis   Eosinophilic esophagitis  Raynaud's syndrome without gangrene  History of heart failure  History of DVT (deep vein thrombosis)  Costochondritis  Endometrial stromal sarcoma (HCC)   History of Clostridioides difficile colitis  History of shingles  History of anxiety  Orders: No orders of the defined types were placed in this encounter.  No orders of the defined types were placed in this encounter.   Face-to-face time spent with patient was *** minutes. Greater than 50% of time was spent in counseling and coordination of care.  Follow-Up Instructions: No follow-ups on file.   Ofilia Neas, PA-C  Note - This record has been created using Dragon software.  Chart creation errors have been sought, but may not always  have been located. Such creation errors do not reflect on  the standard of medical care.

## 2019-08-04 ENCOUNTER — Emergency Department (HOSPITAL_COMMUNITY)
Admission: EM | Admit: 2019-08-04 | Discharge: 2019-08-05 | Disposition: A | Discharge status: Home / Self Care | Payer: Managed Care, Other (non HMO) | Source: Home / Self Care | Attending: Emergency Medicine | Admitting: Emergency Medicine

## 2019-08-04 ENCOUNTER — Other Ambulatory Visit: Payer: Self-pay

## 2019-08-04 ENCOUNTER — Encounter (HOSPITAL_COMMUNITY): Payer: Self-pay | Admitting: Emergency Medicine

## 2019-08-04 DIAGNOSIS — Z79899 Other long term (current) drug therapy: Secondary | ICD-10-CM | POA: Insufficient documentation

## 2019-08-04 DIAGNOSIS — Z882 Allergy status to sulfonamides status: Secondary | ICD-10-CM | POA: Insufficient documentation

## 2019-08-04 DIAGNOSIS — Z888 Allergy status to other drugs, medicaments and biological substances status: Secondary | ICD-10-CM | POA: Insufficient documentation

## 2019-08-04 DIAGNOSIS — R112 Nausea with vomiting, unspecified: Secondary | ICD-10-CM

## 2019-08-04 DIAGNOSIS — R1084 Generalized abdominal pain: Secondary | ICD-10-CM | POA: Insufficient documentation

## 2019-08-04 DIAGNOSIS — K5900 Constipation, unspecified: Secondary | ICD-10-CM | POA: Insufficient documentation

## 2019-08-04 DIAGNOSIS — Z8542 Personal history of malignant neoplasm of other parts of uterus: Secondary | ICD-10-CM | POA: Insufficient documentation

## 2019-08-04 DIAGNOSIS — Z86718 Personal history of other venous thrombosis and embolism: Secondary | ICD-10-CM | POA: Insufficient documentation

## 2019-08-04 HISTORY — DX: Lymphedema, not elsewhere classified: I89.0

## 2019-08-04 LAB — URINALYSIS, ROUTINE W REFLEX MICROSCOPIC
Bilirubin Urine: NEGATIVE
Glucose, UA: NEGATIVE mg/dL
Hgb urine dipstick: NEGATIVE
Ketones, ur: NEGATIVE mg/dL
Nitrite: NEGATIVE
Protein, ur: NEGATIVE mg/dL
Specific Gravity, Urine: 1.016 (ref 1.005–1.030)
pH: 6 (ref 5.0–8.0)

## 2019-08-04 LAB — CBC
HCT: 45.4 % (ref 36.0–46.0)
Hemoglobin: 14.8 g/dL (ref 12.0–15.0)
MCH: 31.2 pg (ref 26.0–34.0)
MCHC: 32.6 g/dL (ref 30.0–36.0)
MCV: 95.6 fL (ref 80.0–100.0)
Platelets: 288 10*3/uL (ref 150–400)
RBC: 4.75 MIL/uL (ref 3.87–5.11)
RDW: 12.5 % (ref 11.5–15.5)
WBC: 7.8 10*3/uL (ref 4.0–10.5)
nRBC: 0 % (ref 0.0–0.2)

## 2019-08-04 LAB — COMPREHENSIVE METABOLIC PANEL
ALT: 19 U/L (ref 0–44)
AST: 18 U/L (ref 15–41)
Albumin: 4.1 g/dL (ref 3.5–5.0)
Alkaline Phosphatase: 93 U/L (ref 38–126)
Anion gap: 12 (ref 5–15)
BUN: 10 mg/dL (ref 6–20)
CO2: 23 mmol/L (ref 22–32)
Calcium: 10 mg/dL (ref 8.9–10.3)
Chloride: 106 mmol/L (ref 98–111)
Creatinine, Ser: 0.99 mg/dL (ref 0.44–1.00)
GFR calc Af Amer: >60 mL/min (ref >60)
GFR calc non Af Amer: >60 mL/min (ref >60)
Glucose, Bld: 128 mg/dL — ABNORMAL HIGH (ref 70–99)
Potassium: 4.4 mmol/L (ref 3.5–5.1)
Sodium: 141 mmol/L (ref 135–145)
Total Bilirubin: 0.6 mg/dL (ref 0.3–1.2)
Total Protein: 7.6 g/dL (ref 6.5–8.1)

## 2019-08-04 LAB — LIPASE, BLOOD: Lipase: 24 U/L (ref 11–51)

## 2019-08-04 MED ORDER — METOCLOPRAMIDE HCL 5 MG/ML IJ SOLN
10.0000 mg | Freq: Once | INTRAMUSCULAR | Status: AC
  Administered 2019-08-04: 22:00:00 10 mg via INTRAVENOUS
  Filled 2019-08-04: qty 2

## 2019-08-04 MED ORDER — MORPHINE SULFATE (PF) 4 MG/ML IV SOLN
4.0000 mg | Freq: Once | INTRAVENOUS | Status: AC
  Administered 2019-08-04: 4 mg via INTRAVENOUS
  Filled 2019-08-04: qty 1

## 2019-08-04 MED ORDER — SODIUM CHLORIDE 0.9% FLUSH
3.0000 mL | Freq: Once | INTRAVENOUS | Status: AC
  Administered 2019-08-05: 3 mL via INTRAVENOUS

## 2019-08-04 MED ORDER — ONDANSETRON HCL 4 MG/2ML IJ SOLN
4.0000 mg | Freq: Once | INTRAMUSCULAR | Status: AC
  Administered 2019-08-05: 4 mg via INTRAVENOUS
  Filled 2019-08-04: qty 2

## 2019-08-04 MED ORDER — MORPHINE SULFATE (PF) 4 MG/ML IV SOLN
4.0000 mg | Freq: Once | INTRAVENOUS | Status: AC
  Administered 2019-08-05: 4 mg via INTRAVENOUS
  Filled 2019-08-04: qty 1

## 2019-08-04 MED ORDER — SODIUM CHLORIDE 0.9 % IV BOLUS
1000.0000 mL | Freq: Once | INTRAVENOUS | Status: AC
  Administered 2019-08-04: 1000 mL via INTRAVENOUS

## 2019-08-04 NOTE — ED Provider Notes (Signed)
H/o gastroparesis N, V and abdominal pain Labs normal H/o frequent CT's Managing with IVF's, pain/nausea management Minimal PO challenge success  Plan: anticipate discharge home but will need to be reassessed to determine disposition  Consider CT if she needs to be admitted.  12:45 - Sipping water without further vomiting. C/O persistent nausea. 2nd liter fluids ordered.   1:45 - feeling better. Sipping apple juice. No vomiting. She states she is ready to try to manage at home. Will discharge per plan of previous treatment team.    Charlann Lange, PA-C 08/05/19 0157    Quintella Reichert, MD 08/07/19 1043

## 2019-08-04 NOTE — ED Triage Notes (Signed)
C/o generalized abd pain since Friday.  States pain worse at umbilicus.  Also reports nausea and vomiting.  Last BM 3 days ago.

## 2019-08-04 NOTE — ED Provider Notes (Signed)
[facility] EMERGENCY DEPARTMENT Provider Note   CSN: [id number] Arrival date & time: 08/04/19  1744     History   Chief Complaint Chief Complaint  Patient presents with  . Abdominal Pain    HPI [name] Horn is a 43 y.o. female.     Patient with history of uterine cancer status post hysterectomy, cholecystectomy, IBS, gastroparesis -- presents acute onset of abdominal pain starting 2 days ago.  Patient describes a sharp pain in the upper part of her abdomen followed by persistent burning type of pain all of her abdomen with associated nausea and vomiting that is worsened over the past 24 hours.  Patient states that her symptoms are somewhat similar to previous spells of gastroparesis.  No fevers, chest pain, shortness of breath.  She has constipation.  No previous history of small bowel obstruction.  She states that she feels some pressure while urinating but otherwise denies increased urinary frequency or urgency.  She has been taking Zofran at home without much improvement.  She takes Percocet at home for pain.       Past Medical History:  Diagnosis Date  . DVT (deep venous thrombosis) ([place])    x2  . Lymphedema   . Uterine cancer [facility])     Patient Active Problem List   Diagnosis Date Noted  . Nausea and vomiting 04/26/2017  . Nausea & vomiting 04/25/2017  . Chest pain 04/25/2017  . ANKLE, PAIN 11/24/2010  . ANXIETY 10/11/2010  . DVT 10/11/2010  . COSTOCHONDRITIS 03/03/2008  . CELLULITIS, GREAT TOE 12/01/2007  . SINUSITIS, ACUTE NOS 09/25/2007  . MENOPAUSE, PREMATURE 07/20/2007  . UTERINE CANCER, HX OF 07/16/2007  . Other postprocedural status(V45.89) 07/16/2007    Past Surgical History:  Procedure Laterality Date  . APPENDECTOMY    . CHOLECYSTECTOMY    . ESOPHAGOGASTRODUODENOSCOPY N/A 04/30/2017   Procedure: ESOPHAGOGASTRODUODENOSCOPY (EGD);  Surgeon: Teena Irani, MD;  Location: [facility] ENDOSCOPY;  Service: Endoscopy;  Laterality: N/A;  . KNEE  SURGERY     x3  . MINIMALLY INVASIVE FORAMINOTOMY CERVICAL SPINE     C6-T1, Nitka  . SAVORY DILATION N/A 04/30/2017   Procedure: SAVORY DILATION;  Surgeon: Teena Irani, MD;  Location: [facility] ENDOSCOPY;  Service: Endoscopy;  Laterality: N/A;  . TONSILLECTOMY    . TOTAL ABDOMINAL HYSTERECTOMY     Sarcoma, s/p XRT     OB History   No obstetric history on file.      Home Medications    Prior to Admission medications   Medication Sig Start Date End Date Taking? Authorizing Provider  acyclovir (ZOVIRAX) 400 MG tablet Take 400 mg by mouth 3 (three) times daily as needed. For 7 days during an outbreak 04/04/19   [provider]  alosetron (LOTRONEX) 0.5 MG tablet Take 0.5 mg by mouth See admin instructions. Take twice daily every two days    [provider]  amphetamine-dextroamphetamine (ADDERALL) 20 MG tablet Take 20 mg by mouth 3 (three) times daily.    [provider]  Bepotastine Besilate (BEPREVE) 1.5 % SOLN Place 1 drop into both eyes 2 (two) times a day.    [provider]  cyclobenzaprine (FLEXERIL) 10 MG tablet Take 1 tablet (10 mg total) 2 (two) times daily as needed by mouth for muscle spasms. Patient not taking: Reported on 06/04/2019 11/12/17   Montine Circle, PA-C  cycloSPORINE (RESTASIS) 0.05 % ophthalmic emulsion Place 1 drop into both eyes 2 (two) times daily.    [provider]  dicyclomine (BENTYL) 10 MG capsule Take 10 mg by mouth daily as needed for nausea/vomiting. 01/02/19   [provider]  fluticasone (FLOVENT HFA) 220 MCG/ACT inhaler Inhale 2 puffs into the lungs 2 (two) times daily.    [provider]  HYDROcodone-acetaminophen (NORCO/VICODIN) 5-325 MG tablet Take 2 tablets every 6 (six) hours as needed by mouth. Patient not taking: Reported on 06/04/2019 11/12/17   Montine Circle, PA-C  hyoscyamine (LEVSIN, ANASPAZ) 0.125 MG tablet Take 0.125 mg by mouth every 4 (four) hours as needed for cramping.  07/25/17    [provider]  ibuprofen (ADVIL,MOTRIN) 800 MG tablet Take 1 tablet (800 mg total) 3 (three) times daily by mouth. Patient not taking: Reported on 06/04/2019 11/12/17   Montine Circle, PA-C  metoCLOPramide (REGLAN) 10 MG tablet Take 10 mg by mouth 2 (two) times a day.    [provider]  ondansetron (ZOFRAN-ODT) 4 MG disintegrating tablet Take 4 mg by mouth every 8 (eight) hours as needed for nausea or vomiting.    [provider]  oxyCODONE-acetaminophen (PERCOCET/ROXICET) 5-325 MG tablet Take 1 tablet by mouth every 8 (eight) hours.    [provider]  pantoprazole (PROTONIX) 40 MG tablet Take 1 tablet (40 mg total) by mouth 2 (two) times daily. 05/02/17 06/04/19  Elgergawy, Silver Huguenin, MD  Polyethyl Glycol-Propyl Glycol (SYSTANE) 0.4-0.3 % GEL ophthalmic gel Place 1 application into both eyes daily as needed (dryness).    [provider]  promethazine (PHENERGAN) 25 MG tablet Take 25 mg by mouth every 6 (six) hours as needed for nausea or vomiting.    [provider]  SUMAtriptan (IMITREX) 25 MG tablet Take 25 mg by mouth every 2 (two) hours as needed for migraine. May repeat in 2 hours if headache persists or recurs.    [provider]  topiramate (TOPAMAX) 25 MG tablet Take 50 mg by mouth 2 (two) times daily.    [provider]  traZODone (DESYREL) 150 MG tablet Take 150 mg by mouth at bedtime as needed for sleep.    [provider]    Family History Family History  Problem Relation Age of Onset  . Hypertension Other   . Thyroid disease Other   . Hyperlipidemia Other   . Colon cancer Other        grandmother    Social History Social History   Tobacco Use  . Smoking status: Never Smoker  . Smokeless tobacco: Never Used  Substance Use Topics  . Alcohol use: Yes  . Drug use: No     Allergies   Sulfa antibiotics, Sulfonamide derivatives, and Benadryl [diphenhydramine hcl]   Review of Systems Review  of Systems  Constitutional: Negative for fever.  HENT: Negative for rhinorrhea and sore throat.   Eyes: Negative for redness.  Respiratory: Negative for cough and shortness of breath.   Cardiovascular: Negative for chest pain.  Gastrointestinal: Positive for abdominal pain, constipation, nausea and vomiting. Negative for diarrhea.  Genitourinary: Negative for dysuria.  Musculoskeletal: Negative for myalgias.  Skin: Negative for rash.  Neurological: Negative for headaches.     Physical Exam Updated Vital Signs BP 119/89   Pulse 90   Temp (!) 96.8 F (36 C) (Temporal)   Resp 11   SpO2 100%   Physical Exam Vitals signs and nursing note reviewed.  Constitutional:      Appearance: She is well-developed.  HENT:     Head: Normocephalic and atraumatic.  Eyes:  General:        Right eye: No discharge.        Left eye: No discharge.     Conjunctiva/sclera: Conjunctivae normal.  Neck:     Musculoskeletal: Normal range of motion and neck supple.  Cardiovascular:     Rate and Rhythm: Normal rate and regular rhythm.     Heart sounds: Normal heart sounds.  Pulmonary:     Effort: Pulmonary effort is normal.     Breath sounds: Normal breath sounds.  Abdominal:     Palpations: Abdomen is soft.     Tenderness: There is generalized abdominal tenderness (mild, non-focal). There is no guarding or rebound.  Skin:    General: Skin is warm and dry.  Neurological:     Mental Status: She is alert.      ED Treatments / Results  Labs (all labs ordered are listed, but only abnormal results are displayed) Labs Reviewed  COMPREHENSIVE METABOLIC PANEL - Abnormal; Notable for the following components:      Result Value   Glucose, Bld 128 (*)    All other components within normal limits  URINALYSIS, ROUTINE W REFLEX MICROSCOPIC - Abnormal; Notable for the following components:   Leukocytes,Ua MODERATE (*)    Bacteria, UA RARE (*)    All other components within normal limits  LIPASE,  BLOOD  CBC    EKG None  Radiology No results found.  Procedures Procedures (including critical care time)  Medications Ordered in ED Medications  sodium chloride flush (NS) 0.9 % injection 3 mL (3 mLs Intravenous Given 08/05/19 0009)  sodium chloride 0.9 % bolus 1,000 mL (0 mLs Intravenous Stopped 08/05/19 0005)  morphine 4 MG/ML injection 4 mg (4 mg Intravenous Given 08/04/19 2151)  metoCLOPramide (REGLAN) injection 10 mg (10 mg Intravenous Given 08/04/19 2147)  ondansetron (ZOFRAN) injection 4 mg (4 mg Intravenous Given 08/05/19 0009)  morphine 4 MG/ML injection 4 mg (4 mg Intravenous Given 08/05/19 0009)  promethazine (PHENERGAN) injection 12.5 mg (12.5 mg Intravenous Given 08/05/19 0211)     Initial Impression / Assessment and Plan / ED Course  I have reviewed the triage vital signs and the nursing notes.  Pertinent labs & imaging results that were available during my care of the patient were reviewed by me and considered in my medical decision making (see chart for details).        Patient seen and examined. Work-up initiated. Medications ordered.   Vital signs reviewed and are as follows: BP 119/89   Pulse 90   Temp (!) 96.8 F (36 C) (Temporal)   Resp 11   SpO2 100%   Reviewed records from the past couple of years.  She has intermittent presentations with similar symptoms including nausea, vomiting, abdominal pain.  She has had several negative CT imaging studies of the abdomen and pelvis with similar symptoms.  Plan is to give IV fluids, pain medication, and nausea medication.  Will reassess.  11:29 PM Patient with modest improvement. Will give additional dose of pain medication and nausea medication. PO trial.   Handoff to help still PA-C at shift change.  Will reassess after patient receives second round of medications.  If she is improved, plan discharge to home.  Final Clinical Impressions(s) / ED Diagnoses   Final diagnoses:  Generalized abdominal pain   Non-intractable vomiting with nausea, unspecified vomiting type   Patient with abdominal pain. Vitals are stable, no fever. Labs reassuring. Symptoms controlled with treatment in the ED.  maging  not felt indicated. No signs of significant dehydration, patient is tolerating PO's. Lungs are clear and no signs suggestive of PNA. Low concern for appendicitis, cholecystitis, pancreatitis, ruptured viscus, UTI, kidney stone, aortic dissection, aortic aneurysm or other emergent abdominal etiology. Symptoms seem similar to previous episodes of gastroparesis. Reviewed previous visits and several negative abdominal CTs.  Supportive therapy indicated with return if symptoms worsen.    ED Discharge Orders    None       Carlisle Cater, Hershal Coria 08/05/19 2246    Quintella Reichert, MD 08/07/19 [phone]

## 2019-08-05 MED ORDER — PROMETHAZINE HCL 25 MG RE SUPP: 25.0000 mg | Freq: Four times a day (QID) | RECTAL | 0 refills | Status: DC | PRN

## 2019-08-05 MED ORDER — ONDANSETRON 4 MG PO TBDP: 4.0000 mg | ORAL_TABLET | Freq: Three times a day (TID) | ORAL | 0 refills | Status: DC | PRN

## 2019-08-05 MED ORDER — SODIUM CHLORIDE 0.9 % IV BOLUS: 1000.0000 mL | Freq: Once | INTRAVENOUS | Status: DC

## 2019-08-05 MED ORDER — PROMETHAZINE HCL 25 MG/ML IJ SOLN
12.5000 mg | Freq: Once | INTRAMUSCULAR | Status: AC
  Administered 2019-08-05: 12.5 mg via INTRAVENOUS
  Filled 2019-08-05: qty 1

## 2019-08-05 NOTE — Discharge Instructions (Addendum)
Use medications for control of nausea as prescribed. Follow up with your doctor by calling the office tomorrow and scheduling a recheck appointment.   Return to the emergency department with any high fever, severe pain, or uncontrolled vomiting.

## 2019-08-06 ENCOUNTER — Encounter (HOSPITAL_COMMUNITY): Payer: Self-pay | Admitting: *Deleted

## 2019-08-06 ENCOUNTER — Other Ambulatory Visit: Payer: Self-pay

## 2019-08-06 ENCOUNTER — Inpatient Hospital Stay (HOSPITAL_COMMUNITY)
Admission: EM | Admit: 2019-08-06 | Discharge: 2019-08-17 | DRG: 392 | Disposition: A | Discharge status: Home / Self Care | Payer: Managed Care, Other (non HMO) | Attending: Internal Medicine | Admitting: Internal Medicine

## 2019-08-06 DIAGNOSIS — Z8249 Family history of ischemic heart disease and other diseases of the circulatory system: Secondary | ICD-10-CM

## 2019-08-06 DIAGNOSIS — Z808 Family history of malignant neoplasm of other organs or systems: Secondary | ICD-10-CM

## 2019-08-06 DIAGNOSIS — Z20828 Contact with and (suspected) exposure to other viral communicable diseases: Secondary | ICD-10-CM | POA: Diagnosis present

## 2019-08-06 DIAGNOSIS — R1110 Vomiting, unspecified: Secondary | ICD-10-CM

## 2019-08-06 DIAGNOSIS — K200 Eosinophilic esophagitis: Secondary | ICD-10-CM | POA: Diagnosis present

## 2019-08-06 DIAGNOSIS — R1084 Generalized abdominal pain: Secondary | ICD-10-CM

## 2019-08-06 DIAGNOSIS — H04129 Dry eye syndrome of unspecified lacrimal gland: Secondary | ICD-10-CM | POA: Diagnosis present

## 2019-08-06 DIAGNOSIS — Z7951 Long term (current) use of inhaled steroids: Secondary | ICD-10-CM

## 2019-08-06 DIAGNOSIS — E869 Volume depletion, unspecified: Secondary | ICD-10-CM | POA: Diagnosis not present

## 2019-08-06 DIAGNOSIS — K627 Radiation proctitis: Secondary | ICD-10-CM | POA: Diagnosis present

## 2019-08-06 DIAGNOSIS — R509 Fever, unspecified: Secondary | ICD-10-CM

## 2019-08-06 DIAGNOSIS — Z800 Family history of malignant neoplasm of digestive organs: Secondary | ICD-10-CM

## 2019-08-06 DIAGNOSIS — Z888 Allergy status to other drugs, medicaments and biological substances status: Secondary | ICD-10-CM

## 2019-08-06 DIAGNOSIS — G43109 Migraine with aura, not intractable, without status migrainosus: Secondary | ICD-10-CM | POA: Diagnosis present

## 2019-08-06 DIAGNOSIS — Z882 Allergy status to sulfonamides status: Secondary | ICD-10-CM

## 2019-08-06 DIAGNOSIS — H5789 Other specified disorders of eye and adnexa: Secondary | ICD-10-CM | POA: Diagnosis present

## 2019-08-06 DIAGNOSIS — Y842 Radiological procedure and radiotherapy as the cause of abnormal reaction of the patient, or of later complication, without mention of misadventure at the time of the procedure: Secondary | ICD-10-CM | POA: Diagnosis present

## 2019-08-06 DIAGNOSIS — Z90710 Acquired absence of both cervix and uterus: Secondary | ICD-10-CM

## 2019-08-06 DIAGNOSIS — Z79899 Other long term (current) drug therapy: Secondary | ICD-10-CM

## 2019-08-06 DIAGNOSIS — K219 Gastro-esophageal reflux disease without esophagitis: Secondary | ICD-10-CM | POA: Diagnosis present

## 2019-08-06 DIAGNOSIS — K580 Irritable bowel syndrome with diarrhea: Secondary | ICD-10-CM | POA: Diagnosis not present

## 2019-08-06 DIAGNOSIS — A09 Infectious gastroenteritis and colitis, unspecified: Secondary | ICD-10-CM | POA: Diagnosis not present

## 2019-08-06 DIAGNOSIS — Z8619 Personal history of other infectious and parasitic diseases: Secondary | ICD-10-CM

## 2019-08-06 DIAGNOSIS — Z8542 Personal history of malignant neoplasm of other parts of uterus: Secondary | ICD-10-CM

## 2019-08-06 DIAGNOSIS — Z86718 Personal history of other venous thrombosis and embolism: Secondary | ICD-10-CM

## 2019-08-06 DIAGNOSIS — Z7722 Contact with and (suspected) exposure to environmental tobacco smoke (acute) (chronic): Secondary | ICD-10-CM | POA: Diagnosis present

## 2019-08-06 DIAGNOSIS — K3184 Gastroparesis: Secondary | ICD-10-CM | POA: Diagnosis not present

## 2019-08-06 DIAGNOSIS — K5900 Constipation, unspecified: Secondary | ICD-10-CM

## 2019-08-06 DIAGNOSIS — K529 Noninfective gastroenteritis and colitis, unspecified: Secondary | ICD-10-CM

## 2019-08-06 HISTORY — DX: Other complications of anesthesia, initial encounter: T88.59XA

## 2019-08-06 LAB — URINALYSIS, ROUTINE W REFLEX MICROSCOPIC
Bilirubin Urine: NEGATIVE
Glucose, UA: NEGATIVE mg/dL
Hgb urine dipstick: NEGATIVE
Ketones, ur: NEGATIVE mg/dL
Leukocytes,Ua: NEGATIVE
Nitrite: NEGATIVE
Protein, ur: NEGATIVE mg/dL
Specific Gravity, Urine: 1.013 (ref 1.005–1.030)
pH: 7 (ref 5.0–8.0)

## 2019-08-06 LAB — COMPREHENSIVE METABOLIC PANEL
ALT: 14 U/L (ref 0–44)
AST: 16 U/L (ref 15–41)
Albumin: 4.2 g/dL (ref 3.5–5.0)
Alkaline Phosphatase: 87 U/L (ref 38–126)
Anion gap: 11 (ref 5–15)
BUN: 7 mg/dL (ref 6–20)
CO2: 22 mmol/L (ref 22–32)
Calcium: 10.2 mg/dL (ref 8.9–10.3)
Chloride: 109 mmol/L (ref 98–111)
Creatinine, Ser: 1.01 mg/dL — ABNORMAL HIGH (ref 0.44–1.00)
GFR calc Af Amer: >60 mL/min (ref >60)
GFR calc non Af Amer: >60 mL/min (ref >60)
Glucose, Bld: 119 mg/dL — ABNORMAL HIGH (ref 70–99)
Potassium: 4.1 mmol/L (ref 3.5–5.1)
Sodium: 142 mmol/L (ref 135–145)
Total Bilirubin: 0.7 mg/dL (ref 0.3–1.2)
Total Protein: 7.5 g/dL (ref 6.5–8.1)

## 2019-08-06 LAB — LIPASE, BLOOD: Lipase: 30 U/L (ref 11–51)

## 2019-08-06 LAB — I-STAT BETA HCG BLOOD, ED (MC, WL, AP ONLY): I-stat hCG, quantitative: <5 m[IU]/mL (ref <5)

## 2019-08-06 LAB — CBC
HCT: 43.7 % (ref 36.0–46.0)
Hemoglobin: 14.5 g/dL (ref 12.0–15.0)
MCH: 31.2 pg (ref 26.0–34.0)
MCHC: 33.2 g/dL (ref 30.0–36.0)
MCV: 94 fL (ref 80.0–100.0)
Platelets: 281 10*3/uL (ref 150–400)
RBC: 4.65 MIL/uL (ref 3.87–5.11)
RDW: 12.5 % (ref 11.5–15.5)
WBC: 7.1 10*3/uL (ref 4.0–10.5)
nRBC: 0 % (ref 0.0–0.2)

## 2019-08-06 MED ORDER — SODIUM CHLORIDE 0.9% FLUSH
3.0000 mL | Freq: Once | INTRAVENOUS | Status: AC
  Administered 2019-08-07: 3 mL via INTRAVENOUS

## 2019-08-06 NOTE — ED Triage Notes (Signed)
Pt was here 2 nights ago for abd pain, received fluids, and was given the option to be admitted or go home. Pt reports going home, but has been unable to keep her home medications down and has had worsening pain with NV

## 2019-08-07 ENCOUNTER — Other Ambulatory Visit: Payer: Self-pay

## 2019-08-07 ENCOUNTER — Encounter (HOSPITAL_COMMUNITY): Payer: Self-pay | Admitting: Internal Medicine

## 2019-08-07 ENCOUNTER — Emergency Department (HOSPITAL_COMMUNITY): Payer: Managed Care, Other (non HMO)

## 2019-08-07 DIAGNOSIS — Z8542 Personal history of malignant neoplasm of other parts of uterus: Secondary | ICD-10-CM

## 2019-08-07 DIAGNOSIS — Z90710 Acquired absence of both cervix and uterus: Secondary | ICD-10-CM

## 2019-08-07 DIAGNOSIS — Z888 Allergy status to other drugs, medicaments and biological substances status: Secondary | ICD-10-CM

## 2019-08-07 DIAGNOSIS — R1033 Periumbilical pain: Secondary | ICD-10-CM

## 2019-08-07 DIAGNOSIS — Z7722 Contact with and (suspected) exposure to environmental tobacco smoke (acute) (chronic): Secondary | ICD-10-CM

## 2019-08-07 DIAGNOSIS — Z9089 Acquired absence of other organs: Secondary | ICD-10-CM

## 2019-08-07 DIAGNOSIS — K3184 Gastroparesis: Secondary | ICD-10-CM

## 2019-08-07 DIAGNOSIS — Z9049 Acquired absence of other specified parts of digestive tract: Secondary | ICD-10-CM

## 2019-08-07 DIAGNOSIS — K5790 Diverticulosis of intestine, part unspecified, without perforation or abscess without bleeding: Secondary | ICD-10-CM

## 2019-08-07 DIAGNOSIS — K5900 Constipation, unspecified: Secondary | ICD-10-CM | POA: Diagnosis not present

## 2019-08-07 DIAGNOSIS — R1032 Left lower quadrant pain: Secondary | ICD-10-CM

## 2019-08-07 DIAGNOSIS — Z882 Allergy status to sulfonamides status: Secondary | ICD-10-CM

## 2019-08-07 DIAGNOSIS — Z86718 Personal history of other venous thrombosis and embolism: Secondary | ICD-10-CM

## 2019-08-07 DIAGNOSIS — Z881 Allergy status to other antibiotic agents status: Secondary | ICD-10-CM

## 2019-08-07 DIAGNOSIS — Z90722 Acquired absence of ovaries, bilateral: Secondary | ICD-10-CM

## 2019-08-07 DIAGNOSIS — Z8619 Personal history of other infectious and parasitic diseases: Secondary | ICD-10-CM

## 2019-08-07 HISTORY — DX: Gastroparesis: K31.84

## 2019-08-07 LAB — LACTIC ACID, PLASMA
Lactic Acid, Venous: 2.6 mmol/L (ref 0.5–1.9)
Lactic Acid, Venous: 1.1 mmol/L (ref 0.5–1.9)

## 2019-08-07 LAB — SARS CORONAVIRUS 2 (TAT 6-24 HRS): SARS Coronavirus 2: NEGATIVE

## 2019-08-07 MED ORDER — KETOROLAC TROMETHAMINE 30 MG/ML IJ SOLN
15.0000 mg | Freq: Once | INTRAMUSCULAR | Status: AC
  Administered 2019-08-07: 15 mg via INTRAVENOUS
  Filled 2019-08-07: qty 1

## 2019-08-07 MED ORDER — ENOXAPARIN SODIUM 40 MG/0.4ML ~~LOC~~ SOLN
40.0000 mg | SUBCUTANEOUS | Status: DC
  Administered 2019-08-07 – 2019-08-16 (×10): 40 mg via SUBCUTANEOUS
  Filled 2019-08-07 (×11): qty 0.4

## 2019-08-07 MED ORDER — PROMETHAZINE HCL 25 MG/ML IJ SOLN
12.5000 mg | Freq: Once | INTRAMUSCULAR | Status: AC
  Administered 2019-08-07: 12.5 mg via INTRAVENOUS
  Filled 2019-08-07: qty 1

## 2019-08-07 MED ORDER — POLYVINYL ALCOHOL 1.4 % OP SOLN
1.0000 [drp] | Freq: Every day | OPHTHALMIC | Status: DC | PRN
  Filled 2019-08-07: qty 15

## 2019-08-07 MED ORDER — PANTOPRAZOLE SODIUM 40 MG IV SOLR
40.0000 mg | Freq: Once | INTRAVENOUS | Status: AC
  Administered 2019-08-07: 40 mg via INTRAVENOUS
  Filled 2019-08-07: qty 40

## 2019-08-07 MED ORDER — CYCLOSPORINE 0.05 % OP EMUL
1.0000 [drp] | Freq: Two times a day (BID) | OPHTHALMIC | Status: DC
  Administered 2019-08-07 – 2019-08-17 (×20): 1 [drp] via OPHTHALMIC
  Filled 2019-08-07 (×22): qty 30

## 2019-08-07 MED ORDER — TOPIRAMATE 25 MG PO TABS: 25.0000 mg | ORAL_TABLET | Freq: Every day | ORAL | Status: DC

## 2019-08-07 MED ORDER — ACETAMINOPHEN 325 MG PO TABS
650.0000 mg | ORAL_TABLET | Freq: Four times a day (QID) | ORAL | Status: DC | PRN
  Administered 2019-08-08 – 2019-08-15 (×10): 650 mg via ORAL
  Filled 2019-08-07 (×11): qty 2

## 2019-08-07 MED ORDER — KETOROLAC TROMETHAMINE 30 MG/ML IJ SOLN
15.0000 mg | Freq: Three times a day (TID) | INTRAMUSCULAR | Status: AC | PRN
  Administered 2019-08-07 – 2019-08-08 (×3): 15 mg via INTRAVENOUS
  Filled 2019-08-07 (×3): qty 1

## 2019-08-07 MED ORDER — SODIUM CHLORIDE 0.9 % IV BOLUS
1000.0000 mL | Freq: Once | INTRAVENOUS | Status: AC
  Administered 2019-08-07: 1000 mL via INTRAVENOUS

## 2019-08-07 MED ORDER — METOCLOPRAMIDE HCL 5 MG/ML IJ SOLN
10.0000 mg | Freq: Once | INTRAMUSCULAR | Status: AC
  Administered 2019-08-07: 10 mg via INTRAVENOUS
  Filled 2019-08-07: qty 2

## 2019-08-07 MED ORDER — METOCLOPRAMIDE HCL 5 MG/ML IJ SOLN
10.0000 mg | Freq: Four times a day (QID) | INTRAMUSCULAR | Status: DC
  Administered 2019-08-07 – 2019-08-17 (×38): 10 mg via INTRAVENOUS
  Filled 2019-08-07 (×38): qty 2

## 2019-08-07 MED ORDER — SODIUM CHLORIDE 0.9% FLUSH
3.0000 mL | Freq: Two times a day (BID) | INTRAVENOUS | Status: DC
  Administered 2019-08-07 – 2019-08-17 (×13): 3 mL via INTRAVENOUS

## 2019-08-07 MED ORDER — ACETAMINOPHEN 650 MG RE SUPP: 650.0000 mg | Freq: Four times a day (QID) | RECTAL | Status: DC | PRN

## 2019-08-07 MED ORDER — DICYCLOMINE HCL 10 MG/ML IM SOLN
20.0000 mg | Freq: Once | INTRAMUSCULAR | Status: AC
  Administered 2019-08-07: 20 mg via INTRAMUSCULAR
  Filled 2019-08-07: qty 2

## 2019-08-07 MED ORDER — PROMETHAZINE HCL 25 MG/ML IJ SOLN
12.5000 mg | Freq: Four times a day (QID) | INTRAMUSCULAR | Status: DC | PRN
  Administered 2019-08-07 – 2019-08-17 (×20): 12.5 mg via INTRAVENOUS
  Filled 2019-08-07 (×20): qty 1

## 2019-08-07 MED ORDER — TOPIRAMATE 25 MG PO TABS
25.0000 mg | ORAL_TABLET | Freq: Every evening | ORAL | Status: DC | PRN
  Administered 2019-08-14: 25 mg via ORAL
  Filled 2019-08-07 (×3): qty 1

## 2019-08-07 MED ORDER — BUDESONIDE 0.5 MG/2ML IN SUSP
0.5000 mg | Freq: Two times a day (BID) | RESPIRATORY_TRACT | Status: DC
  Administered 2019-08-08 – 2019-08-14 (×12): 0.5 mg via RESPIRATORY_TRACT
  Filled 2019-08-07 (×13): qty 2

## 2019-08-07 MED ORDER — TOPIRAMATE 100 MG PO TABS
100.0000 mg | ORAL_TABLET | Freq: Every day | ORAL | Status: DC
  Administered 2019-08-07 – 2019-08-17 (×10): 100 mg via ORAL
  Filled 2019-08-07 (×11): qty 1

## 2019-08-07 MED ORDER — ONDANSETRON HCL 4 MG/2ML IJ SOLN
4.0000 mg | Freq: Four times a day (QID) | INTRAMUSCULAR | Status: DC | PRN
  Administered 2019-08-07 – 2019-08-15 (×12): 4 mg via INTRAVENOUS
  Filled 2019-08-07 (×16): qty 2

## 2019-08-07 MED ORDER — ATROPINE SULFATE 1 % OP SOLN
1.0000 [drp] | Freq: Every day | OPHTHALMIC | Status: DC | PRN
  Filled 2019-08-07: qty 2

## 2019-08-07 MED ORDER — FLUTICASONE PROPIONATE HFA 220 MCG/ACT IN AERO: 2.0000 | INHALATION_SPRAY | Freq: Two times a day (BID) | RESPIRATORY_TRACT | Status: DC

## 2019-08-07 MED ORDER — MILK AND MOLASSES ENEMA
1.0000 | Freq: Once | RECTAL | Status: AC
  Administered 2019-08-07: 240 mL via RECTAL
  Filled 2019-08-07: qty 240

## 2019-08-07 MED ORDER — PANTOPRAZOLE SODIUM 40 MG IV SOLR
40.0000 mg | INTRAVENOUS | Status: DC
  Administered 2019-08-07 – 2019-08-17 (×11): 40 mg via INTRAVENOUS
  Filled 2019-08-07 (×12): qty 40

## 2019-08-07 MED ORDER — METOCLOPRAMIDE HCL 5 MG/ML IJ SOLN
10.0000 mg | INTRAMUSCULAR | Status: AC
  Administered 2019-08-07: 10 mg via INTRAVENOUS
  Filled 2019-08-07: qty 2

## 2019-08-07 MED ORDER — OLOPATADINE HCL 0.1 % OP SOLN
1.0000 [drp] | Freq: Two times a day (BID) | OPHTHALMIC | Status: DC
  Administered 2019-08-07 – 2019-08-17 (×21): 1 [drp] via OPHTHALMIC
  Filled 2019-08-07: qty 5

## 2019-08-07 MED ORDER — PREDNISOLONE ACETATE 1 % OP SUSP
1.0000 [drp] | Freq: Two times a day (BID) | OPHTHALMIC | Status: DC
  Administered 2019-08-07 – 2019-08-17 (×21): 1 [drp] via OPHTHALMIC
  Filled 2019-08-07: qty 5

## 2019-08-07 MED ORDER — SODIUM CHLORIDE 0.9 % IV SOLN
INTRAVENOUS | Status: AC
  Administered 2019-08-07 – 2019-08-08 (×3): via INTRAVENOUS

## 2019-08-07 NOTE — ED Notes (Signed)
Admitting at bedside 

## 2019-08-07 NOTE — ED Provider Notes (Signed)
[facility] EMERGENCY DEPARTMENT Provider Note   CSN: [id number] Arrival date & time: 08/06/19  2023    History   Chief Complaint Chief Complaint  Patient presents with  . Abdominal Pain    HPI [name] Horn is a 43 y.o. female.     43 year old female with a history of gastroparesis, followed by GI, presents to the emergency department for persistent abdominal pain, nausea, vomiting.  She has been experiencing some left upper quadrant abdominal pain as well as periumbilical discomfort.  This initially began 3 days ago.  Was seen in the emergency department after onset of symptoms.  Was medically managed and discharged with instruction to follow-up with her outpatient doctors.  Has been trying to continue her daily medications, but reports persistent emesis.  Was only able to keep 1 dose of her Protonix down yesterday.  She has not had any bowel movement in the past 5 days.  Reports passing little flatus and only voiding once in the past 24 hours.  She has not had any fevers.  States that her symptoms are fairly consistent with her past exacerbations of gastroparesis.  The history is provided by the patient. No language interpreter was used.  Abdominal Pain   Past Medical History:  Diagnosis Date  . DVT (deep venous thrombosis) ([place])    x2  . Lymphedema   . Uterine cancer [facility])     Patient Active Problem List   Diagnosis Date Noted  . Nausea and vomiting 04/26/2017  . Nausea & vomiting 04/25/2017  . Chest pain 04/25/2017  . ANKLE, PAIN 11/24/2010  . ANXIETY 10/11/2010  . DVT 10/11/2010  . COSTOCHONDRITIS 03/03/2008  . CELLULITIS, GREAT TOE 12/01/2007  . SINUSITIS, ACUTE NOS 09/25/2007  . MENOPAUSE, PREMATURE 07/20/2007  . UTERINE CANCER, HX OF 07/16/2007  . Other postprocedural status(V45.89) 07/16/2007    Past Surgical History:  Procedure Laterality Date  . APPENDECTOMY    . CHOLECYSTECTOMY    . ESOPHAGOGASTRODUODENOSCOPY N/A 04/30/2017    Procedure: ESOPHAGOGASTRODUODENOSCOPY (EGD);  Surgeon: Teena Irani, MD;  Location: [facility] ENDOSCOPY;  Service: Endoscopy;  Laterality: N/A;  . KNEE SURGERY     x3  . MINIMALLY INVASIVE FORAMINOTOMY CERVICAL SPINE     C6-T1, Nitka  . SAVORY DILATION N/A 04/30/2017   Procedure: SAVORY DILATION;  Surgeon: Teena Irani, MD;  Location: [facility] ENDOSCOPY;  Service: Endoscopy;  Laterality: N/A;  . TONSILLECTOMY    . TOTAL ABDOMINAL HYSTERECTOMY     Sarcoma, s/p XRT     OB History   No obstetric history on file.      Home Medications    Prior to Admission medications   Medication Sig Start Date End Date Taking? Authorizing Provider  amphetamine-dextroamphetamine (ADDERALL) 20 MG tablet Take 20 mg by mouth 3 (three) times daily.   Yes [provider]  atropine 1 % ophthalmic solution Place 1 drop into the left eye daily as needed (for dry eye).   Yes [provider]  Bepotastine Besilate (BEPREVE) 1.5 % SOLN Place 1 drop into both eyes 2 (two) times a day.   Yes [provider]  cycloSPORINE (RESTASIS) 0.05 % ophthalmic emulsion Place 1 drop into both eyes 2 (two) times daily.   Yes [provider]  dicyclomine (BENTYL) 10 MG capsule Take 10 mg by mouth daily as needed for nausea/vomiting. 01/02/19  Yes [provider]  fluticasone (FLOVENT HFA) 220 MCG/ACT inhaler Inhale 2 puffs into the lungs 2 (two) times daily.  Yes [provider]  furosemide (LASIX) 20 MG tablet Take 20 mg by mouth every Monday, Wednesday, and Friday.   Yes [provider]  hyoscyamine (LEVSIN, ANASPAZ) 0.125 MG tablet Take 0.125 mg by mouth every 4 (four) hours as needed for cramping.  07/25/17  Yes [provider]  metoCLOPramide (REGLAN) 10 MG tablet Take 10 mg by mouth daily.    Yes [provider]  ondansetron (ZOFRAN-ODT) 4 MG disintegrating tablet Take 1 tablet (4 mg total) by mouth every 8 (eight) hours as needed for nausea or vomiting. 08/05/19   Yes Upstill, Shari, PA-C  Polyethyl Glycol-Propyl Glycol (SYSTANE) 0.4-0.3 % GEL ophthalmic gel Place 1 application into both eyes daily as needed (dryness).   Yes [provider]  potassium chloride (K-DUR) 10 MEQ tablet Take 10 mEq by mouth daily.   Yes [provider]  prednisoLONE acetate (PRED FORTE) 1 % ophthalmic suspension Place 1 drop into the left eye 2 (two) times daily.   Yes [provider]  promethazine (PHENERGAN) 25 MG suppository Place 1 suppository (25 mg total) rectally every 6 (six) hours as needed for nausea or vomiting. 08/05/19  Yes Upstill, Shari, PA-C  promethazine (PHENERGAN) 25 MG tablet Take 25 mg by mouth every 6 (six) hours as needed for nausea or vomiting.   Yes [provider]  SUMAtriptan (IMITREX) 25 MG tablet Take 25 mg by mouth every 2 (two) hours as needed for migraine. May repeat in 2 hours if headache persists or recurs.   Yes [provider]  topiramate (TOPAMAX) 100 MG tablet Take 100 mg by mouth daily. 06/18/19  Yes [provider]  topiramate (TOPAMAX) 25 MG tablet Take 25 mg by mouth at bedtime.    Yes [provider]  traZODone (DESYREL) 150 MG tablet Take 150 mg by mouth at bedtime as needed for sleep.   Yes [provider]  pantoprazole (PROTONIX) 40 MG tablet Take 1 tablet (40 mg total) by mouth 2 (two) times daily. Patient not taking: Reported on 08/05/2019 05/02/17 08/04/28  Elgergawy, Silver Huguenin, MD    Family History Family History  Problem Relation Age of Onset  . Hypertension Other   . Thyroid disease Other   . Hyperlipidemia Other   . Colon cancer Other        grandmother    Social History Social History   Tobacco Use  . Smoking status: Never Smoker  . Smokeless tobacco: Never Used  Substance Use Topics  . Alcohol use: Yes  . Drug use: No     Allergies   Sulfa antibiotics, Sulfonamide derivatives, and Benadryl [diphenhydramine hcl]   Review of Systems Review  of Systems  Gastrointestinal: Positive for abdominal pain.  Ten systems reviewed and are negative for acute change, except as noted in the HPI.    Physical Exam Updated Vital Signs BP 117/79   Pulse 79   Temp 98.6 F (37 C)   Resp 16   SpO2 98%   Physical Exam Vitals signs and nursing note reviewed.  Constitutional:      General: She is not in acute distress.    Appearance: She is well-developed. She is not diaphoretic.     Comments: Nontoxic appearing and in NAD  HENT:     Head: Normocephalic and atraumatic.  Eyes:     General: No scleral icterus.    Conjunctiva/sclera: Conjunctivae normal.  Neck:     Musculoskeletal: Normal range of motion.  Cardiovascular:  Rate and Rhythm: Regular rhythm. Tachycardia present.     Pulses: Normal pulses.  Pulmonary:     Effort: Pulmonary effort is normal. No respiratory distress.     Breath sounds: No stridor. No wheezing.     Comments: Respirations even and unlabored Abdominal:     Palpations: There is no mass.     Tenderness: There is abdominal tenderness.     Comments: Mild periumbilical TTP without masses. Abdomen soft without peritoneal signs.   Musculoskeletal: Normal range of motion.  Skin:    General: Skin is warm and dry.     Coloration: Skin is not pale.     Findings: No erythema or rash.  Neurological:     General: No focal deficit present.     Mental Status: She is alert and oriented to person, place, and time.     Coordination: Coordination normal.  Psychiatric:        Behavior: Behavior normal.      ED Treatments / Results  Labs (all labs ordered are listed, but only abnormal results are displayed) Labs Reviewed  COMPREHENSIVE METABOLIC PANEL - Abnormal; Notable for the following components:      Result Value   Glucose, Bld 119 (*)    Creatinine, Ser 1.01 (*)    All other components within normal limits  LACTIC ACID, PLASMA - Abnormal; Notable for the following components:   Lactic Acid, Venous 2.6 (*)     All other components within normal limits  LIPASE, BLOOD  CBC  URINALYSIS, ROUTINE W REFLEX MICROSCOPIC  I-STAT BETA HCG BLOOD, ED ([facility], [facility], [facility] ONLY)    EKG None  Radiology Dg Abd 2 Views  Result Date: 08/07/2019 CLINICAL DATA:  Abdominal pain, nausea/vomiting EXAM: ABDOMEN - 2 VIEW COMPARISON:  CT abdomen/pelvis dated 11/12/2017 FINDINGS: Nonobstructive bowel gas pattern. No evidence of free air under the diaphragm on the upright view. Moderate colonic stool burden, suggesting constipation. Cholecystectomy clips. Surgical clips related to prior pelvic lymphadenectomy. Visualized osseous structures are within normal limits. IMPRESSION: No evidence of small bowel obstruction or free air. Moderate colonic stool burden, suggesting constipation. Electronically Signed   By: Julian Hy M.D.   On: 08/07/2019 03:06    Procedures Procedures (including critical care time)  Medications Ordered in ED Medications  sodium chloride flush (NS) 0.9 % injection 3 mL (3 mLs Intravenous Given 08/07/19 0547)  sodium chloride 0.9 % bolus 1,000 mL (0 mLs Intravenous Stopped 08/07/19 0548)  metoCLOPramide (REGLAN) injection 10 mg (10 mg Intravenous Given 08/07/19 0329)  pantoprazole (PROTONIX) injection 40 mg (40 mg Intravenous Given 08/07/19 0328)  ketorolac (TORADOL) 30 MG/ML injection 15 mg (15 mg Intravenous Given 08/07/19 0329)  dicyclomine (BENTYL) injection 20 mg (20 mg Intramuscular Given 08/07/19 0301)  sodium chloride 0.9 % bolus 1,000 mL (1,000 mLs Intravenous New Bag/Given 08/07/19 0550)  promethazine (PHENERGAN) injection 12.5 mg (12.5 mg Intravenous Given 08/07/19 0553)  ketorolac (TORADOL) 30 MG/ML injection 15 mg (15 mg Intravenous Given 08/07/19 0551)  milk and molasses enema (240 mLs Rectal Given 08/07/19 0630)    4:29 AM Patient's tachycardia has resolved on repeat assessment.  She has not had any vomiting since roomed in the ED.  Does complain of some persistent nausea with only mild  improvement.  Reporting little change to abdominal pain.  Her x-ray did suggest moderate constipation; no obstruction.  This may be contributing to her discomfort.  She is open to an enema while in the ED.  Will order milk and  molasses enema as well as Phenergan, additional 15 mg IV Toradol.  7:04 AM Patient received enema and is attempting BM. Lactate mildly elevated, but without clinical concern for obstruction on exam. No abdominal distension, leukocytosis. Plan for fluid challenge.    Initial Impression / Assessment and Plan / ED Course  I have reviewed the triage vital signs and the nursing notes.  Pertinent labs & imaging results that were available during my care of the patient were reviewed by me and considered in my medical decision making (see chart for details).        43 year old female presents to the emergency department for symptoms of persistent gastroparesis.  She has a longstanding history of this and is actively followed by GI.  Has been unable to tolerate her oral medications secondary to persistent vomiting.  Laboratory work-up in the ED today is reassuring.  She was initially tachycardic favoring dehydration.  Given 2 L IV fluids while in the ED with improvement in heart rate.  X-ray without obstructive changes, though moderate constipation noted.  She has not had a bowel movement in 5 days.  Enema attempted for additional symptomatic relief.  The patient has not been observed to have had any vomiting since bedded in the emergency department.  Will fluid challenge.  Believe she can be appropriately discharged for outpatient GI follow up if tolerating oral fluids.  Patient signed out to [place], PA-C at change of shift who will reassess and disposition appropriately.    Final Clinical Impressions(s) / ED Diagnoses   Final diagnoses:  Vomiting  Gastroparesis  Constipation, unspecified constipation type    ED Discharge Orders    None       Antonietta Breach, PA-C  08/07/19 7517    Varney Biles, MD 08/07/19 2309

## 2019-08-07 NOTE — ED Notes (Signed)
Provider made aware of critical Lactic Acid result.
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
## 2019-08-07 NOTE — ED Provider Notes (Signed)
Pt signed out to me by Ermalinda Memos, PA-C. Please see previous notes for further history.  In brief, pt presenting for evaluation of abd pain, n/v, and gastroparesis. Followed by GI. She was seen in the ED 2 days ago, was better after medication and d/c. Pt states since d/c she has not been able to tolerate PO. Labs today have been reassuring except a mildly elevated lactic. Xray with colonic stool. Enema has been given. She has received toradol, bentyl, reglan, phenergan. She has had multiple CT scans in the past, will hold on repeat today. Will try fluid challenge and plan for d/c.   720: informed pt vomiting after a few sips of apple juice. Reevaluated pt who states she is still having a lot of pain. I am concerned that she is not improving much after 11 hrs, multiple rounds of IV medications. I think she will benefit from admission. Will order covid.  PCP with [facility].  [facility]  Discussed with internal medicine teaching service, pt to be admitted.    Franchot Heidelberg, PA-C 08/07/19 [id number]    Sherwood Gambler, MD 08/07/19 [phone]

## 2019-08-07 NOTE — Progress Notes (Signed)
New Admission Note:   Arrival Method: bed  Mental Orientation: Alert and oriented  Telemetry: none  Assessment: Completed Skin: Intact  IV: Right Forearm , Right AC  Pain: 7/10  Tubes: none Safety Measures: Safety Fall Prevention Plan has been given, discussed and signed Admission: Completed [facility] Orientation: Patient has been orientated to the room, unit and staff.  Family: none   Orders have been reviewed and implemented. Will continue to monitor the patient. Call light has been placed within reach and bed alarm has been activated.   Akela Pocius RN [facility] Phone: [phone]

## 2019-08-07 NOTE — ED Notes (Signed)
Pt unable to keep a few sips of apple juice down. Sophia, PA made aware via messenger.

## 2019-08-07 NOTE — ED Notes (Signed)
Pt has water & apple juice at bedside.

## 2019-08-07 NOTE — ED Notes (Signed)
Clear liquid lunch tray ordered 

## 2019-08-08 DIAGNOSIS — G43109 Migraine with aura, not intractable, without status migrainosus: Secondary | ICD-10-CM | POA: Diagnosis present

## 2019-08-08 DIAGNOSIS — K627 Radiation proctitis: Secondary | ICD-10-CM | POA: Diagnosis present

## 2019-08-08 DIAGNOSIS — Y842 Radiological procedure and radiotherapy as the cause of abnormal reaction of the patient, or of later complication, without mention of misadventure at the time of the procedure: Secondary | ICD-10-CM | POA: Diagnosis present

## 2019-08-08 DIAGNOSIS — Z8542 Personal history of malignant neoplasm of other parts of uterus: Secondary | ICD-10-CM | POA: Diagnosis not present

## 2019-08-08 DIAGNOSIS — K200 Eosinophilic esophagitis: Secondary | ICD-10-CM | POA: Diagnosis present

## 2019-08-08 DIAGNOSIS — K219 Gastro-esophageal reflux disease without esophagitis: Secondary | ICD-10-CM | POA: Diagnosis present

## 2019-08-08 DIAGNOSIS — Z8619 Personal history of other infectious and parasitic diseases: Secondary | ICD-10-CM | POA: Diagnosis not present

## 2019-08-08 DIAGNOSIS — Z90710 Acquired absence of both cervix and uterus: Secondary | ICD-10-CM | POA: Diagnosis not present

## 2019-08-08 DIAGNOSIS — Z79899 Other long term (current) drug therapy: Secondary | ICD-10-CM | POA: Diagnosis not present

## 2019-08-08 DIAGNOSIS — K5900 Constipation, unspecified: Secondary | ICD-10-CM | POA: Diagnosis present

## 2019-08-08 DIAGNOSIS — K580 Irritable bowel syndrome with diarrhea: Secondary | ICD-10-CM | POA: Diagnosis not present

## 2019-08-08 DIAGNOSIS — Z7722 Contact with and (suspected) exposure to environmental tobacco smoke (acute) (chronic): Secondary | ICD-10-CM | POA: Diagnosis present

## 2019-08-08 DIAGNOSIS — Z8249 Family history of ischemic heart disease and other diseases of the circulatory system: Secondary | ICD-10-CM | POA: Diagnosis not present

## 2019-08-08 DIAGNOSIS — R1084 Generalized abdominal pain: Secondary | ICD-10-CM | POA: Diagnosis not present

## 2019-08-08 DIAGNOSIS — R1012 Left upper quadrant pain: Secondary | ICD-10-CM | POA: Diagnosis not present

## 2019-08-08 DIAGNOSIS — H04129 Dry eye syndrome of unspecified lacrimal gland: Secondary | ICD-10-CM | POA: Diagnosis present

## 2019-08-08 DIAGNOSIS — H5789 Other specified disorders of eye and adnexa: Secondary | ICD-10-CM | POA: Diagnosis present

## 2019-08-08 DIAGNOSIS — A09 Infectious gastroenteritis and colitis, unspecified: Secondary | ICD-10-CM | POA: Diagnosis not present

## 2019-08-08 DIAGNOSIS — R109 Unspecified abdominal pain: Secondary | ICD-10-CM | POA: Diagnosis not present

## 2019-08-08 DIAGNOSIS — Z800 Family history of malignant neoplasm of digestive organs: Secondary | ICD-10-CM | POA: Diagnosis not present

## 2019-08-08 DIAGNOSIS — R509 Fever, unspecified: Secondary | ICD-10-CM | POA: Diagnosis not present

## 2019-08-08 DIAGNOSIS — R197 Diarrhea, unspecified: Secondary | ICD-10-CM | POA: Diagnosis not present

## 2019-08-08 DIAGNOSIS — Z882 Allergy status to sulfonamides status: Secondary | ICD-10-CM | POA: Diagnosis not present

## 2019-08-08 DIAGNOSIS — Z86718 Personal history of other venous thrombosis and embolism: Secondary | ICD-10-CM | POA: Diagnosis not present

## 2019-08-08 DIAGNOSIS — Z888 Allergy status to other drugs, medicaments and biological substances status: Secondary | ICD-10-CM | POA: Diagnosis not present

## 2019-08-08 DIAGNOSIS — K3184 Gastroparesis: Secondary | ICD-10-CM | POA: Diagnosis present

## 2019-08-08 DIAGNOSIS — Z20828 Contact with and (suspected) exposure to other viral communicable diseases: Secondary | ICD-10-CM | POA: Diagnosis present

## 2019-08-08 DIAGNOSIS — E869 Volume depletion, unspecified: Secondary | ICD-10-CM | POA: Diagnosis not present

## 2019-08-08 DIAGNOSIS — Z808 Family history of malignant neoplasm of other organs or systems: Secondary | ICD-10-CM | POA: Diagnosis not present

## 2019-08-08 DIAGNOSIS — R112 Nausea with vomiting, unspecified: Secondary | ICD-10-CM | POA: Diagnosis not present

## 2019-08-08 DIAGNOSIS — Z7951 Long term (current) use of inhaled steroids: Secondary | ICD-10-CM | POA: Diagnosis not present

## 2019-08-08 DIAGNOSIS — G43909 Migraine, unspecified, not intractable, without status migrainosus: Secondary | ICD-10-CM | POA: Diagnosis not present

## 2019-08-08 DIAGNOSIS — K210 Gastro-esophageal reflux disease with esophagitis: Secondary | ICD-10-CM | POA: Diagnosis not present

## 2019-08-08 LAB — BASIC METABOLIC PANEL
Anion gap: 8 (ref 5–15)
BUN: 11 mg/dL (ref 6–20)
CO2: 19 mmol/L — ABNORMAL LOW (ref 22–32)
Calcium: 8.4 mg/dL — ABNORMAL LOW (ref 8.9–10.3)
Chloride: 114 mmol/L — ABNORMAL HIGH (ref 98–111)
Creatinine, Ser: 0.94 mg/dL (ref 0.44–1.00)
GFR calc Af Amer: >60 mL/min (ref >60)
GFR calc non Af Amer: >60 mL/min (ref >60)
Glucose, Bld: 95 mg/dL (ref 70–99)
Potassium: 3.5 mmol/L (ref 3.5–5.1)
Sodium: 141 mmol/L (ref 135–145)

## 2019-08-08 LAB — HIV ANTIBODY (ROUTINE TESTING W REFLEX): HIV Screen 4th Generation wRfx: NONREACTIVE

## 2019-08-08 MED ORDER — MORPHINE SULFATE 4 MG/ML IJ SOLN
4.0000 mg | Freq: Once | INTRAMUSCULAR | Status: AC
  Administered 2019-08-08: 4 mg via INTRAVENOUS
  Filled 2019-08-08: qty 1

## 2019-08-08 NOTE — Plan of Care (Signed)
  Problem: Elimination: Goal: Will not experience complications related to bowel motility Outcome: Progressing   

## 2019-08-08 NOTE — Plan of Care (Signed)
  Problem: Education: Goal: Knowledge of General Education information will improve Description: Including pain rating scale, medication(s)/side effects and non-pharmacologic comfort measures Outcome: Progressing   Problem: Pain Managment: Goal: General experience of comfort will improve Outcome: Progressing   

## 2019-08-08 NOTE — Progress Notes (Signed)
   Subjective:   Ms. Ducey was seen at bedside. She was awake and alert. She states that she feels less pain in her left side than yesterday. She states that after the milk and molasses enema she did have a small bowel movement that alleviated her pain. She kindly asked if we could avoid a M&M enema as it was "very messy and got everywhere." She agreed to a soap sud enema to see if it will help her constipation. All questions and concerns were addressed.   Objective:  Vital signs in last 24 hours: Vitals:   08/07/19 1555 08/07/19 2021 08/08/19 0457 08/08/19 0925  BP: (!) 97/55 99/62 (!) 103/56 107/71  Pulse: 82 79 74 75  Resp: 18 16 16 18   Temp: 99.1 F (37.3 C) 98.4 F (36.9 C) 98.5 F (36.9 C) 98.3 F (36.8 C)  TempSrc: Oral Oral Oral Oral  SpO2: 97% 96% 99% 97%  Weight:   85.9 kg    Physical Exam Vitals signs and nursing note reviewed.  Constitutional:      General: She is not in acute distress.    Appearance: She is well-developed. She is not ill-appearing, toxic-appearing or diaphoretic.  HENT:     Head: Normocephalic and atraumatic.  Cardiovascular:     Rate and Rhythm: Normal rate and regular rhythm.     Heart sounds: Normal heart sounds. No murmur. No friction rub. No gallop.   Pulmonary:     Effort: Pulmonary effort is normal. No respiratory distress.     Breath sounds: Normal breath sounds. No stridor. No wheezing.  Abdominal:     General: Abdomen is protuberant. Bowel sounds are normal. There is distension.     Palpations: Abdomen is rigid. There is no shifting dullness, fluid wave, hepatomegaly or splenomegaly.     Tenderness: There is no abdominal tenderness. There is no guarding or rebound.     Comments: Mildly distended and rigid to palpation in the lower left quadrant. In the presence of her xray findings this is likely 2/2 to her stool load.   Neurological:     Mental Status: She is alert.     Assessment/Plan:  Active Problems:   Gastroparesis   Constipation:  - Likely secondary to radiation proctitis and multiple abdominal surgeries. - Abdominal xray shows large amounts of hardened stool spanning from the sigmoid colon to the R transverse colon.  - Ordered Soap Suds enema - 4 mg morphine PRN  Gastroparesis:  - Reglan 10 mg injection - Zofran 4 mg PRN - Phenergan 12.5 mg PRN  GERD:  - Protonix 40 mg injection  Eosinophilic Esophagitis:  - Continue Fluticasone inhaler - Continue Pulmicort   Inflammatory Eye disease:  - Continue Restasis 1 drop, both eye, BID - Olopatadine 1 drop, both eye, BID - Prednisolone 1 drop, Left eye BID - Polyvinyl alcohol 1 drop, PRN, dry eyes - Atropine 1 drop, PRN, dry eyes  Migraines:  - Continue Topamax   Dispo: Anticipated discharge pending medical course.   Maudie Mercury, MD 08/08/2019, 2:00 PM Pager: [phone]

## 2019-08-09 DIAGNOSIS — H5789 Other specified disorders of eye and adnexa: Secondary | ICD-10-CM

## 2019-08-09 DIAGNOSIS — K5900 Constipation, unspecified: Secondary | ICD-10-CM

## 2019-08-09 DIAGNOSIS — Z79899 Other long term (current) drug therapy: Secondary | ICD-10-CM

## 2019-08-09 DIAGNOSIS — Z7951 Long term (current) use of inhaled steroids: Secondary | ICD-10-CM

## 2019-08-09 DIAGNOSIS — K210 Gastro-esophageal reflux disease with esophagitis: Secondary | ICD-10-CM

## 2019-08-09 DIAGNOSIS — K3184 Gastroparesis: Principal | ICD-10-CM

## 2019-08-09 DIAGNOSIS — G43909 Migraine, unspecified, not intractable, without status migrainosus: Secondary | ICD-10-CM

## 2019-08-09 DIAGNOSIS — Z923 Personal history of irradiation: Secondary | ICD-10-CM

## 2019-08-09 DIAGNOSIS — Z9889 Other specified postprocedural states: Secondary | ICD-10-CM

## 2019-08-09 MED ORDER — POLYETHYLENE GLYCOL 3350 17 G PO PACK
17.0000 g | PACK | Freq: Once | ORAL | Status: DC
  Filled 2019-08-09: qty 1

## 2019-08-09 MED ORDER — MORPHINE SULFATE (PF) 4 MG/ML IV SOLN
4.0000 mg | Freq: Once | INTRAVENOUS | Status: AC
  Administered 2019-08-09: 4 mg via INTRAVENOUS
  Filled 2019-08-09: qty 1

## 2019-08-09 MED ORDER — SORBITOL 70 % SOLN
960.0000 mL | TOPICAL_OIL | Freq: Once | ORAL | Status: AC
  Administered 2019-08-09: 960 mL via RECTAL
  Filled 2019-08-09 (×2): qty 473

## 2019-08-09 MED ORDER — SODIUM CHLORIDE 0.9 % IV SOLN
250.0000 mg | Freq: Three times a day (TID) | INTRAVENOUS | Status: DC
  Administered 2019-08-09 – 2019-08-13 (×11): 250 mg via INTRAVENOUS
  Filled 2019-08-09 (×15): qty 5

## 2019-08-09 MED ORDER — GLYCERIN (LAXATIVE) 2.1 G RE SUPP
1.0000 | Freq: Once | RECTAL | Status: DC
  Filled 2019-08-09: qty 1

## 2019-08-09 MED ORDER — MORPHINE BOLUS VIA INFUSION
4.0000 mg | Freq: Once | INTRAVENOUS | Status: DC
  Filled 2019-08-09: qty 4

## 2019-08-09 MED ORDER — ALUM & MAG HYDROXIDE-SIMETH 200-200-20 MG/5ML PO SUSP
30.0000 mL | Freq: Once | ORAL | Status: AC
  Administered 2019-08-09: 30 mL via ORAL
  Filled 2019-08-09: qty 30

## 2019-08-09 NOTE — Progress Notes (Addendum)
   Subjective:   Ms. Daffron was seen at bedside. She was awake and alert. She states that the soap sud enema was more tolerable than the M&M enema yesterday. She states that she had a small bowel movement after the administration. She states she had a small movement on her own this morning. She described her feces as "small hard bullets with water." She also states that she does have more abdominal pain, and would like another enema if one were to be ordered. All questions and concerns were administered.   She also wanted to bring to the team's attention that she has had a bezoar during her last EGD.   Objective:  Vital signs in last 24 hours: Vitals:   08/08/19 1726 08/08/19 2028 08/08/19 2057 08/09/19 0451  BP: 103/72 106/69  110/65  Pulse: 70 77  (!) 59  Resp: 18 18  16   Temp: 98.2 F (36.8 C) 98.5 F (36.9 C)  98.3 F (36.8 C)  TempSrc: Oral Oral  Oral  SpO2: 99% 98% 97% 99%  Weight:  85.7 kg     Physical Exam Vitals signs and nursing note reviewed.  Constitutional:      Appearance: She is well-developed. She is not ill-appearing or diaphoretic.     Comments: Appears anxious during the interview.   HENT:     Head: Normocephalic and atraumatic.  Cardiovascular:     Rate and Rhythm: Normal rate and regular rhythm.     Heart sounds: Normal heart sounds. No murmur. No friction rub. No gallop.   Pulmonary:     Effort: Pulmonary effort is normal. No respiratory distress.     Breath sounds: Normal breath sounds. No stridor. No wheezing.  Abdominal:     General: Abdomen is protuberant. Bowel sounds are normal. There is distension. There is no abdominal bruit.     Palpations: Abdomen is rigid. There is no shifting dullness, fluid wave, hepatomegaly or splenomegaly.     Tenderness: There is generalized abdominal tenderness. There is no guarding or rebound.     Comments: Mildly distended and rigid to palpation. Tenderness on palpation with no rebound tenderness.   Neurological:   Mental Status: She is alert.     Assessment/Plan:  Active Problems:   Gastroparesis  Constipation:  - Likely secondary to radiation proctitis and multiple abdominal surgeries.  - Ordered SMOG enema.  - 4 mg morphine PRN  Gastroparesis:  - Reglan 10 mg injection - Ordered erythromycin 250 mg Q8H  - Zofran 4 mg PRN - Phenergan 12.5 mg PRN  GERD:  - Protonix 40 mg injection  Eosinophilic Esophagitis:  - Continue Fluticasone inhaler - Continue Pulmicort   Inflammatory Eye disease:  - Continue Restasis 1 drop, both eye, BID - Olopatadine 1 drop, both eye, BID - Prednisolone 1 drop, Left eye BID - Polyvinyl alcohol 1 drop, PRN, dry eyes - Atropine 1 drop, PRN, dry eyes  Migraines:  - Continue Topamax   Dispo: Anticipated discharge pending medical course.   Maudie Mercury, MD 08/09/2019, 7:07 AM Pager: [phone]

## 2019-08-10 ENCOUNTER — Inpatient Hospital Stay (HOSPITAL_COMMUNITY): Payer: Managed Care, Other (non HMO)

## 2019-08-10 DIAGNOSIS — R1084 Generalized abdominal pain: Secondary | ICD-10-CM

## 2019-08-10 DIAGNOSIS — R509 Fever, unspecified: Secondary | ICD-10-CM

## 2019-08-10 LAB — CBC WITH DIFFERENTIAL/PLATELET
Abs Immature Granulocytes: 0.06 10*3/uL (ref 0.00–0.07)
Basophils Absolute: 0 10*3/uL (ref 0.0–0.1)
Basophils Relative: 0 %
Eosinophils Absolute: 0 10*3/uL (ref 0.0–0.5)
Eosinophils Relative: 0 %
HCT: 44.7 % (ref 36.0–46.0)
Hemoglobin: 14.8 g/dL (ref 12.0–15.0)
Immature Granulocytes: 0 %
Lymphocytes Relative: 7 %
Lymphs Abs: 1.1 10*3/uL (ref 0.7–4.0)
MCH: 30.9 pg (ref 26.0–34.0)
MCHC: 33.1 g/dL (ref 30.0–36.0)
MCV: 93.3 fL (ref 80.0–100.0)
Monocytes Absolute: 1.2 10*3/uL — ABNORMAL HIGH (ref 0.1–1.0)
Monocytes Relative: 8 %
Neutro Abs: 12.6 10*3/uL — ABNORMAL HIGH (ref 1.7–7.7)
Neutrophils Relative %: 85 %
Platelets: 304 10*3/uL (ref 150–400)
RBC: 4.79 MIL/uL (ref 3.87–5.11)
RDW: 12.4 % (ref 11.5–15.5)
WBC: 14.9 10*3/uL — ABNORMAL HIGH (ref 4.0–10.5)
nRBC: 0 % (ref 0.0–0.2)

## 2019-08-10 LAB — BASIC METABOLIC PANEL
Anion gap: 11 (ref 5–15)
BUN: 8 mg/dL (ref 6–20)
CO2: 21 mmol/L — ABNORMAL LOW (ref 22–32)
Calcium: 9.3 mg/dL (ref 8.9–10.3)
Chloride: 111 mmol/L (ref 98–111)
Creatinine, Ser: 1.06 mg/dL — ABNORMAL HIGH (ref 0.44–1.00)
GFR calc Af Amer: >60 mL/min (ref >60)
GFR calc non Af Amer: >60 mL/min (ref >60)
Glucose, Bld: 132 mg/dL — ABNORMAL HIGH (ref 70–99)
Potassium: 4.1 mmol/L (ref 3.5–5.1)
Sodium: 143 mmol/L (ref 135–145)

## 2019-08-10 MED ORDER — LACTATED RINGERS IV BOLUS
1000.0000 mL | Freq: Once | INTRAVENOUS | Status: AC
  Administered 2019-08-10: 1000 mL via INTRAVENOUS

## 2019-08-10 MED ORDER — PIPERACILLIN-TAZOBACTAM 3.375 G IVPB
3.3750 g | Freq: Three times a day (TID) | INTRAVENOUS | Status: DC
  Administered 2019-08-10 – 2019-08-13 (×9): 3.375 g via INTRAVENOUS
  Filled 2019-08-10 (×10): qty 50

## 2019-08-10 MED ORDER — MORPHINE SULFATE (PF) 4 MG/ML IV SOLN
4.0000 mg | Freq: Once | INTRAVENOUS | Status: AC
  Administered 2019-08-10: 4 mg via INTRAVENOUS
  Filled 2019-08-10: qty 1

## 2019-08-10 MED ORDER — PROMETHAZINE HCL 25 MG/ML IJ SOLN
12.5000 mg | Freq: Once | INTRAMUSCULAR | Status: AC
  Administered 2019-08-10: 12.5 mg via INTRAVENOUS
  Filled 2019-08-10: qty 1

## 2019-08-10 NOTE — Progress Notes (Signed)
Pharmacy Antibiotic Note  [name] Horn is a 43 y.o. female admitted on 08/06/2019 with intraabdominal infection. Noted that patient has abdominal distension and rigidity. Pharmacy has been consulted for zosyn dosing. Patient's WBC increased from 7.1 to 14.9, Scr 1.06, and patient febrile with Tmax of 101.6 today.     Plan: Zosyn 3.375g IV q8h (4 hour infusion).  Monitor Scr, CBC and clinical progression F/u BCx and LOT plans  Height: 5\' 6"  (167.6 cm) Weight: 188 lb 15 oz (85.7 kg) IBW/kg (Calculated) : 59.3  Temp (24hrs), Avg:100.3 F (37.9 C), Min:98.9 F (37.2 C), Max:101.6 F (38.7 C)  Recent Labs  Lab 08/04/19 1831 08/06/19 2037 08/07/19 0601 08/07/19 1632 08/08/19 0514 08/10/19 0814  WBC 7.8 7.1  --   --   --  14.9*  CREATININE 0.99 1.01*  --   --  0.94 1.06*  LATICACIDVEN  --   --  2.6* 1.1  --   --     Estimated Creatinine Clearance: 74.7 mL/min (A) (by C-G formula based on SCr of 1.06 mg/dL (H)).    Allergies  Allergen Reactions  . Sulfa Antibiotics Anaphylaxis, Shortness Of Breath, Swelling and Rash    Angioedema (also)  . Sulfonamide Derivatives Hives, Shortness Of Breath and Swelling    TONGUE SWELLS  . Benadryl [Diphenhydramine Hcl] Other (See Comments)    Hyperactivity     Antimicrobials this admission: Zosyn 8/15 >>   Dose adjustments this admission: N/A  Microbiology results: 8/15 BCx:   Thank you for allowing pharmacy to be a part of this patient's care.  Leron Croak, PharmD PGY2 Hematology/Oncology Pharmacy Resident 08/10/2019 11:28 AM

## 2019-08-10 NOTE — Progress Notes (Signed)
   Subjective:   [name] Horn was seen at bedside. [name] Horn was awake and alert. [name] Horn states that the [facility] enema worked "too well" and [name] Horn had multiple bouts of bowel movements last night [name] Horn also states that [name] Horn stools have had some blood mixed within them as well. [name] Horn got around 20 minutes of sleep last night due to [name] Horn vomiting and BM. [name] Horn states that [name] Horn vomit has been yellow and green in color. [name] Horn also reports having a fever and is in pain. All questions and concerns were addressed.   Objective:  Vital signs in last 24 hours: Vitals:   08/09/19 2025 08/10/19 0418 08/10/19 0818 08/10/19 0934  BP: (!) 164/88 (!) 115/101  139/90  Pulse: 82 93  (!) 107  Resp: 18 16  12   Temp: 98.9 F (37.2 C) 100.2 F (37.9 C)  (!) 101.6 F (38.7 C)  TempSrc: Oral Oral  Oral  SpO2: 98% 97% 97% 97%  Weight:       Physical Exam Vitals signs and nursing note reviewed.  Constitutional:      Appearance: [name] Horn is well-developed. [name] Horn is not ill-appearing or diaphoretic.     Comments: Appears anxious during the interview.   HENT:     Head: Normocephalic and atraumatic.  Cardiovascular:     Rate and Rhythm: Normal rate and regular rhythm.     Heart sounds: Normal heart sounds. No murmur. No friction rub. No gallop.   Pulmonary:     Effort: Pulmonary effort is normal. No respiratory distress.     Breath sounds: Normal breath sounds. No stridor. No wheezing.  Abdominal:     General: Abdomen is protuberant. Bowel sounds are normal. There is distension. There is no abdominal bruit.     Palpations: Abdomen is rigid. There is no shifting dullness, fluid wave, hepatomegaly or splenomegaly.     Tenderness: There is generalized abdominal tenderness. There is no guarding or rebound.     Comments: Mildly distended and rigid to palpation. Tenderness on palpation with no rebound tenderness.   Neurological:     Mental Status: [name] Horn is alert.     Assessment/Plan:  Principal Problem:   Gastroparesis Active Problems:  Constipation  Constipation:  - Likely secondary to radiation proctitis and multiple abdominal surgeries.   - 4 mg morphine PRN  Abdominal Pain with Fever:  After multiple bouts of bowel movements and vomiting, this could be 2/2 to smog enema, and returning function of the GI tract. With distension of the large colon, this could be 2/2 to a micro tear with extravasation of the colonic flora or possible UTI. - Temp: 101.6 F - WBC: 14.9 - Ordered Abdominal xray - Ordered U/A with reflex - Ordered blood cultures x2  Gastroparesis:  - Reglan 10 mg injection - Erythromycin 250 mg Q8H  - Zofran 4 mg PRN - Phenergan 12.5 mg PRN  GERD:  - Protonix 40 mg injection  Eosinophilic Esophagitis:  - Continue Fluticasone inhaler - Continue Pulmicort   Inflammatory Eye disease:  - Continue Restasis 1 drop, both eye, BID - Olopatadine 1 drop, both eye, BID - Prednisolone 1 drop, Left eye BID - Polyvinyl alcohol 1 drop, PRN, dry eyes - Atropine 1 drop, PRN, dry eyes  Migraines:  - Continue Topamax   Dispo: Anticipated discharge pending medical course.   Maudie Mercury, MD 08/10/2019, 10:32 AM Pager: [phone]

## 2019-08-10 NOTE — Progress Notes (Signed)
Temperature of 101.6 and MEWS score is 4. MD notified and made aware of situation. Tylenol PO given per PRN orders. Orders placed for blood cultures and urine culture. Awaiting collection. Patient complaining of pain 8/10 in abdomen. MD made aware of this as well. Orders placed. Will continue to monitor patients status.

## 2019-08-11 LAB — CBC
HCT: 40.2 % (ref 36.0–46.0)
Hemoglobin: 13.7 g/dL (ref 12.0–15.0)
MCH: 31.5 pg (ref 26.0–34.0)
MCHC: 34.1 g/dL (ref 30.0–36.0)
MCV: 92.4 fL (ref 80.0–100.0)
Platelets: 245 10*3/uL (ref 150–400)
RBC: 4.35 MIL/uL (ref 3.87–5.11)
RDW: 12.4 % (ref 11.5–15.5)
WBC: 13.2 10*3/uL — ABNORMAL HIGH (ref 4.0–10.5)
nRBC: 0 % (ref 0.0–0.2)

## 2019-08-11 MED ORDER — SUMATRIPTAN SUCCINATE 6 MG/0.5ML ~~LOC~~ SOLN
6.0000 mg | Freq: Once | SUBCUTANEOUS | Status: AC
  Administered 2019-08-11: 6 mg via SUBCUTANEOUS
  Filled 2019-08-11: qty 0.5

## 2019-08-11 MED ORDER — KETOROLAC TROMETHAMINE 30 MG/ML IJ SOLN
30.0000 mg | Freq: Once | INTRAMUSCULAR | Status: AC
  Administered 2019-08-11: 30 mg via INTRAMUSCULAR
  Filled 2019-08-11: qty 1

## 2019-08-11 MED ORDER — LACTATED RINGERS IV BOLUS
500.0000 mL | Freq: Once | INTRAVENOUS | Status: AC
  Administered 2019-08-11: 500 mL via INTRAVENOUS

## 2019-08-11 MED ORDER — KETOROLAC TROMETHAMINE 30 MG/ML IJ SOLN: 30.0000 mg | Freq: Once | INTRAMUSCULAR | Status: DC

## 2019-08-11 NOTE — Progress Notes (Signed)
   Subjective:  [name] is awake and alert. She states that she has had another difficult night, needing to use the toilet very frequently and is seeing blood in her stool unchanged from yesterday. She denies pain with defecation. She has had a couple episodes of yellow/green emesis. She reports ongoing diffuse abdominal tenderness and subjective fevers overnight. Eating has been difficult. She has only been able to tolerate some sprite and a couple spoonfuls of jell-o. She mentioned that she is beginning to experience migraine aura symptoms, specifically spots of light in her left field of vision.   Objective:  Vital signs in last 24 hours: Vitals:   08/10/19 2206 08/11/19 0451 08/11/19 0845 08/11/19 0859  BP: 118/68 107/71  111/74  Pulse: 95 91  91  Resp: 18 18  18   Temp: 100 F (37.8 C) 99.6 F (37.6 C)  99.5 F (37.5 C)  TempSrc: Oral Oral  Oral  SpO2: 97% 97% 98% 98%  Weight: 82.5 kg     Height:       Weight change:   Intake/Output Summary (Last 24 hours) at 08/11/2019 0948 Last data filed at 08/11/2019 0815 Gross per 24 hour  Intake 2396.7 ml  Output 130 ml  Net 2266.7 ml   CBC Latest Ref Rng & Units 08/11/2019 08/10/2019 08/06/2019  WBC 4.0 - 10.5 K/uL 13.2(H) 14.9(H) 7.1  Hemoglobin 12.0 - 15.0 g/dL 13.7 14.8 14.5  Hematocrit 36.0 - 46.0 % 40.2 44.7 43.7  Platelets 150 - 400 K/uL 245 304 281   BMP Latest Ref Rng & Units 08/10/2019 08/08/2019 08/06/2019  Glucose 70 - 99 mg/dL 132(H) 95 119(H)  BUN 6 - 20 mg/dL 8 11 7   Creatinine 0.44 - 1.00 mg/dL 1.06(H) 0.94 1.01(H)  Sodium 135 - 145 mmol/L 143 141 142  Potassium 3.5 - 5.1 mmol/L 4.1 3.5 4.1  Chloride 98 - 111 mmol/L 111 114(H) 109  CO2 22 - 32 mmol/L 21(L) 19(L) 22  Calcium 8.9 - 10.3 mg/dL 9.3 8.4(L) 10.2   Physical Exam:  General: well-nourished, non-toxic appearing, sitting up in bed HEENT: NCAT, sclerae non-icteric  CV: RRR, normal S1 and S2, no m/r/g appreciated. No edema.  Pulm: CTAB, normal work of  breathing Abd: normal bowel sounds. Soft, non-distended, mildly tender to palpation. No guarding.  Neuro: A/Ox3, conversant with normal speech, no gross motor defects    Assessment/Plan:  Principal Problem:   Gastroparesis Active Problems:   Constipation  Abdominal pain with diarrhea and fever:   - Temp: 99.6 from 101.6 yesterday - WBC: 13.2 from 14.9 - No acute findings on abdominal xray (8/15) - UA pending - Blood cultures x2 pending  - IV Zosyn - LR bolus given fluid loss from diarrhea  Migraine: - Aura symptoms present, Imitrex 6 mg sub-q once  Gastroparesis:  - Reglan 10 mg injection - Erythromycin 250 mg q8h - Zofran 4 mg prn - Phenergan 12.5 mg PRN  GERD:  - Protonix 40 mg injection qd  Eosinophilic Esophagitis: - Continue Fluticasone inhaler - Continue Pulmicort  Inflammatory Eye disease:  -  Continue Restasis 1 drop, bot eyes, bid - Olopatadine 1 drop, both eyes, bid - Prednisolone 1 drop, left eye bid - Polyvinyl alcohol 1 drop, prn, dry eyes - atropine 1 drop, prn, dry eyes   LOS: 3 days   [name] Horn, Medical Student 08/11/2019, 9:48 AM

## 2019-08-11 NOTE — Progress Notes (Signed)
Patient given PO Tylenol for pain and vomited the medication up approximately 15 minutes later. MD notified and made aware. Will continue to monitor.

## 2019-08-11 NOTE — Progress Notes (Signed)
Patient complaining of pain and requesting IV Morphine. MD notified. MD stated that she was hesitant to give patient IV Morphine D/T history of constipation, but stated that she would look in patients chart to see if there was anything else she could do to treat her pain. Patient updated of this information and understanding.

## 2019-08-12 ENCOUNTER — Ambulatory Visit: Payer: Managed Care, Other (non HMO) | Admitting: Rheumatology

## 2019-08-12 DIAGNOSIS — R112 Nausea with vomiting, unspecified: Secondary | ICD-10-CM

## 2019-08-12 DIAGNOSIS — R1012 Left upper quadrant pain: Secondary | ICD-10-CM

## 2019-08-12 DIAGNOSIS — Z8719 Personal history of other diseases of the digestive system: Secondary | ICD-10-CM

## 2019-08-12 DIAGNOSIS — R197 Diarrhea, unspecified: Secondary | ICD-10-CM

## 2019-08-12 LAB — CBC
HCT: 38.8 % (ref 36.0–46.0)
Hemoglobin: 12.9 g/dL (ref 12.0–15.0)
MCH: 31.4 pg (ref 26.0–34.0)
MCHC: 33.2 g/dL (ref 30.0–36.0)
MCV: 94.4 fL (ref 80.0–100.0)
Platelets: 243 10*3/uL (ref 150–400)
RBC: 4.11 MIL/uL (ref 3.87–5.11)
RDW: 12.2 % (ref 11.5–15.5)
WBC: 10.7 10*3/uL — ABNORMAL HIGH (ref 4.0–10.5)
nRBC: 0 % (ref 0.0–0.2)

## 2019-08-12 MED ORDER — SUMATRIPTAN SUCCINATE 6 MG/0.5ML ~~LOC~~ SOLN
6.0000 mg | SUBCUTANEOUS | Status: AC | PRN
  Administered 2019-08-12 (×2): 6 mg via SUBCUTANEOUS
  Filled 2019-08-12 (×4): qty 0.5

## 2019-08-12 NOTE — Consult Note (Signed)
Referring Provider: Dr. Heber  Primary Care Physician:  Burnadette Peter, MD Primary Gastroenterologist:  Dr. Therisa Doyne  Reason for Consultation:  Diarrhea; Fever  HPI: [name] Horn is a 43 y.o. female with a history of gastroparesis, C. Diff colitis, radiation proctitis, diarrhea-predominant irritable bowel syndrome (IBS) stating that her normal bowel habit is 4-6 small volume loose nonbloody stools per day. Seen for a consult due to diarrhea and fever with a temp of 101.6 on 08/10/19. Prior to admit she had not had a BM for several days, which is unusual for her. History of abdominal pain with her IBS but reports that her pain prior to admit felt different than her normal abd pain. Pain was LUQ and epigastric and intense. Reports last C. Diff dx 3 years ago. Has had chronic diarrhea since radiation treatment for uterine cancer in 2005. Sees Dr. Nanetta Batty for GI in [place] and reports a normal colonoscopy last year. Reports history of esophageal rings and states she was dilated last year. 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 Xray on 08/07/19 showed moderate stool burden and she was given a SMOG enema and since then reports multiple large volume bouts of nonbloody diarrhea. Mushy stool reported yesterday afternoon but she reports she had profuse episodes of diarrhea following that. Complaining of diffuse abdominal pain and nausea without vomiting. C. Diff pending.  Past Medical History:  Diagnosis Date  . Complication of anesthesia    " i TAKE A LIITLE BIT LONGER TO [place] UP "  . DVT (deep venous thrombosis) ([facility])    x2  . Gastroparesis 08/07/2019  . Lymphedema   . Uterine cancer [facility])     Past Surgical History:  Procedure Laterality Date  . APPENDECTOMY    . CHOLECYSTECTOMY    . ESOPHAGOGASTRODUODENOSCOPY N/A 04/30/2017   Procedure: ESOPHAGOGASTRODUODENOSCOPY (EGD);  Surgeon: Teena Irani, MD;  [facility] ENDOSCOPY;  Service: Endoscopy;  Laterality: N/A;  . KNEE SURGERY     x3  . MINIMALLY INVASIVE FORAMINOTOMY CERVICAL SPINE     C6-T1, Nitka  . SAVORY DILATION N/A 04/30/2017   Procedure: SAVORY DILATION;  Surgeon: Teena Irani, MD;  Location: [facility] ENDOSCOPY;  Service: Endoscopy;  Laterality: N/A;  . TONSILLECTOMY    . TOTAL ABDOMINAL HYSTERECTOMY     Sarcoma, s/p XRT    Prior to Admission medications   Medication Sig Start Date End Date Taking? Authorizing Provider  amphetamine-dextroamphetamine (ADDERALL) 20 MG tablet Take 20 mg by mouth 3 (three) times daily.   Yes [provider]  atropine 1 % ophthalmic solution Place 1 drop into the left eye daily as needed (for dry eye).   Yes [provider]  Bepotastine Besilate (BEPREVE) 1.5 % SOLN Place 1 drop into both eyes 2 (two) times a day.   Yes [provider]  cycloSPORINE (RESTASIS) 0.05 % ophthalmic emulsion Place 1 drop into both eyes 2 (two) times daily.   Yes [provider]  dicyclomine (BENTYL) 10 MG capsule Take 10 mg by mouth daily as needed for nausea/vomiting. 01/02/19  Yes [provider]  fluticasone (FLOVENT HFA) 220 MCG/ACT inhaler Inhale 2 puffs into the lungs 2 (two) times daily.   Yes [provider]  furosemide (LASIX) 20 MG tablet Take 20 mg by mouth every Monday, Wednesday, and Friday.   Yes [provider]  hyoscyamine (LEVSIN, ANASPAZ) 0.125 MG tablet Take 0.125 mg by mouth every 4 (four) hours as needed for cramping.  07/25/17  Yes [provider]  metoCLOPramide (REGLAN) 10 MG tablet Take 10 mg by mouth daily.    Yes [provider]  ondansetron (ZOFRAN-ODT) 4 MG disintegrating tablet Take 1 tablet (4 mg total) by mouth every 8 (eight) hours as needed for nausea or vomiting. 08/05/19  Yes Upstill, Shari, PA-C  Polyethyl Glycol-Propyl Glycol (SYSTANE) 0.4-0.3 % GEL ophthalmic gel Place 1 application into both eyes daily as needed (dryness).   Yes [provider]  potassium chloride (K-DUR) 10 MEQ tablet Take  10 mEq by mouth daily.   Yes [provider]  prednisoLONE acetate (PRED FORTE) 1 % ophthalmic suspension Place 1 drop into the left eye 2 (two) times daily.   Yes [provider]  promethazine (PHENERGAN) 25 MG suppository Place 1 suppository (25 mg total) rectally every 6 (six) hours as needed for nausea or vomiting. 08/05/19  Yes Upstill, Shari, PA-C  promethazine (PHENERGAN) 25 MG tablet Take 25 mg by mouth every 6 (six) hours as needed for nausea or vomiting.   Yes [provider]  SUMAtriptan (IMITREX) 25 MG tablet Take 25 mg by mouth every 2 (two) hours as needed for migraine. May repeat in 2 hours if headache persists or recurs.   Yes [provider]  topiramate (TOPAMAX) 100 MG tablet Take 100 mg by mouth daily. 06/18/19  Yes [provider]  topiramate (TOPAMAX) 25 MG tablet Take 25 mg by mouth at bedtime.    Yes [provider]  traZODone (DESYREL) 150 MG tablet Take 150 mg by mouth at bedtime as needed for sleep.   Yes [provider]  pantoprazole (PROTONIX) 40 MG tablet Take 1 tablet (40 mg total) by mouth 2 (two) times daily. Patient not taking: Reported on 08/05/2019 05/02/17 08/04/28  Elgergawy, Silver Huguenin, MD    Scheduled Meds: . budesonide (PULMICORT) nebulizer solution  0.5 mg Nebulization BID  . cycloSPORINE  1 drop Both Eyes BID  . enoxaparin (LOVENOX) injection  40 mg Subcutaneous Q24H  . Glycerin (Adult)  1 suppository Rectal Once   Or  . polyethylene glycol  17 g Oral Once  . metoCLOPramide (REGLAN) injection  10 mg Intravenous Q6H  . olopatadine  1 drop Both Eyes BID  . pantoprazole (PROTONIX) IV  40 mg Intravenous Q24H  . prednisoLONE acetate  1 drop Left Eye BID  . sodium chloride flush  3 mL Intravenous Q12H  . topiramate  100 mg Oral Daily   Continuous Infusions: . erythromycin 250 mg (08/12/19 1221)  . piperacillin-tazobactam (ZOSYN)  IV 3.375 g (08/12/19 1406)   PRN Meds:.acetaminophen **OR**  acetaminophen, atropine, ondansetron (ZOFRAN) IV, polyvinyl alcohol, promethazine, SUMAtriptan, topiramate  Allergies as of 08/06/2019 - Review Complete 08/06/2019  Allergen Reaction Noted  . Sulfa antibiotics Anaphylaxis, Shortness Of Breath, Swelling, and Rash 08/26/2011  . Sulfonamide derivatives Hives, Shortness Of Breath, and Swelling   . Benadryl [diphenhydramine hcl] Other (See Comments) 10/27/2012    Family History  Problem Relation Age of Onset  . Hypertension Other   . Hyperlipidemia Other   . Colon cancer Other        grandmother  . Hashimoto's thyroiditis Mother   . Throat cancer Father   . Arrhythmia Father     Social History   Socioeconomic History  . Marital status: Married    Spouse name: Not on file  . Number of children: Not on file  . Years of education: Not on file  . Highest education level: Not on file  Occupational History  . Not  on file  Social Needs  . Financial resource strain: Not on file  . Food insecurity    Worry: Not on file    Inability: Not on file  . Transportation needs    Medical: Not on file    Non-medical: Not on file  Tobacco Use  . Smoking status: Never Smoker  . Smokeless tobacco: Never Used  Substance and Sexual Activity  . Alcohol use: Not Currently  . Drug use: No  . Sexual activity: Not on file  Lifestyle  . Physical activity    Days per week: Not on file    Minutes per session: Not on file  . Stress: Not on file  Relationships  . Social Herbalist on phone: Not on file    Gets together: Not on file    Attends religious service: Not on file    Active member of club or organization: Not on file    Attends meetings of clubs or organizations: Not on file    Relationship status: Not on file  . Intimate partner violence    Fear of current or ex partner: Not on file    Emotionally abused: Not on file    Physically abused: Not on file    Forced sexual activity: Not on file  Other Topics Concern  . Not on  file  Social History Narrative  . Not on file    Review of Systems: All negative except as stated above in HPI.  Physical Exam: Vital signs: Vitals:   08/12/19 0759 08/12/19 0904  BP:  112/75  Pulse:  92  Resp:  18  Temp:  98.5 F (36.9 C)  SpO2: 98% 100%   Last BM Date: 08/11/19 General:  Lethargic, thin, mild acute distress  Head: normocephalic, atraumatic Eyes: anicteric sclera ENT: oropharynx clear Neck: supple, nontender Lungs:  Clear throughout to auscultation.   No wheezes, crackles, or rhonchi. No acute distress. Heart:  Regular rate and rhythm; no murmurs, clicks, rubs,  or gallops. Abdomen: Diffuse tenderness with guarding, soft, nondistended, +BS  Rectal:  Deferred Ext: no edema  GI:  Lab Results: Recent Labs    08/10/19 0814 08/11/19 0540 08/12/19 0714  WBC 14.9* 13.2* 10.7*  HGB 14.8 13.7 12.9  HCT 44.7 40.2 38.8  PLT 304 245 243   BMET Recent Labs    08/10/19 0814  NA 143  K 4.1  CL 111  CO2 21*  GLUCOSE 132*  BUN 8  CREATININE 1.06*  CALCIUM 9.3   LFT No results for input(s): PROT, ALBUMIN, AST, ALT, ALKPHOS, BILITOT, BILIDIR, IBILI in the last 72 hours. PT/INR No results for input(s): LABPROT, INR in the last 72 hours.   Studies/Results: No results found.  Impression/Plan: 43 yo with diarrhea-predominant IBS and radiation proctitis who had constipation prior to admit and has been having profuse diarrhea since receiving a SMOG enema late last week. On IV Zosyn. Fevers with last fever of 99.7 yesterday afternoon. I think her diarrhea is multi-factorial from her radiation proctitis, irritable bowel syndrome, and the recent enemas. I do not think her fevers are due to her GI tract. I do not think a repeat colonoscopy is needed at this time. Tried to reassure patient. Agree with checking for C. Diff. May need an updated CT scan if abdominal pain continues but await C. Diff results. Patient requesting pain meds but would minimize pain meds  and defer to primary team. Continue supportive care. Will f/u.    LOS:  4 days   [name]  08/12/2019, 4:00 PM  Questions please call [phone]

## 2019-08-12 NOTE — Progress Notes (Signed)
Subjective:  [name] Horn is awake and alert. She states that she has had another difficult night, needing to use the toilet very frequently and is seeing blood in her stool unchanged from yesterday. She has had 6 liquid bowel movements as of 0900 this morning. She also reports constant nausea with occasional exacerbations associated with vomiting. She describes her vomit as yellow with some red streaks. The patient is well-educated on a gastroparesis diet including eating frequent, small meals, but her GI symptoms have prevented her from being able to eat or tolerate PO medications. She describes constant abdominal pain with severe, stabbing pain near the umbilicus occurring about 5 minutes prior to her frequent bowel movements. [name] Horn is also experiencing headache consistent with her normal migraines.     Objective:  Vital signs in last 24 hours: Vitals:   08/11/19 2053 08/12/19 0427 08/12/19 0654 08/12/19 0759  BP: 110/60 (!) 88/59 106/60   Pulse: 97 95 90   Resp: 16 16 16    Temp:  98.7 F (37.1 C)    TempSrc:  Oral    SpO2: 98% 98%  98%  Weight:      Height:       Weight change:   Intake/Output Summary (Last 24 hours) at 08/12/2019 0849 Last data filed at 08/12/2019 0600 Gross per 24 hour  Intake 1830.23 ml  Output 0 ml  Net 1830.23 ml   CBC Latest Ref Rng & Units 08/12/2019 08/11/2019 08/10/2019  WBC 4.0 - 10.5 K/uL 10.7(H) 13.2(H) 14.9(H)  Hemoglobin 12.0 - 15.0 g/dL 12.9 13.7 14.8  Hematocrit 36.0 - 46.0 % 38.8 40.2 44.7  Platelets 150 - 400 K/uL 243 245 304   BMP Latest Ref Rng & Units 08/10/2019 08/08/2019 08/06/2019  Glucose 70 - 99 mg/dL 132(H) 95 119(H)  BUN 6 - 20 mg/dL 8 11 7   Creatinine 0.44 - 1.00 mg/dL 1.06(H) 0.94 1.01(H)  Sodium 135 - 145 mmol/L 143 141 142  Potassium 3.5 - 5.1 mmol/L 4.1 3.5 4.1  Chloride 98 - 111 mmol/L 111 114(H) 109  CO2 22 - 32 mmol/L 21(L) 19(L) 22  Calcium 8.9 - 10.3 mg/dL 9.3 8.4(L) 10.2   Physical Exam:  General: well-nourished,  non-toxic appearing but uncomfortable, laying in bed HEENT: NCAT, sclerae non-icteric  CV: No edema.  Pulm: normal work of breathing Abd: normal bowel sounds. Soft, non-distended, tender to palpation. No guarding.  Neuro: A/Ox3, conversant with normal speech, no gross motor defects    Assessment/Plan:  Principal Problem:   Gastroparesis Active Problems:   Constipation  [name] Horn is having persistent GI symptoms. She presented with a gastroparesis flair and significant constipation which was resolved with a SMOG enema (8/14). Soon afterwards, she experienced fever to 101.6 with an elevated WBC count to 14.7. Zosyn was added. Her temp has since resolved and WBCs have decreased to 10.7, but she continues to have frequent, liquid, bloody stools associated with severe abdominal pain. She also has constant nausea and occasional vomiting and is unable to tolerate anything PO. This could be due to bowel irritation as a result of the SMOG enema. We are also considering infectious causes. If symptoms persist with intervention, we may also consider IBD.   Abdominal pain with diarrhea and fever:  - Fever resolved. Temp 98.7 - WBC: 10.7 from 13.2 - No acute findings on abdominal xray (8/15) - UA pending - Blood cultures x2 no growth @ 48 hours - IV Zosyn - LR bolus given fluid loss from diarrhea -  Test for C. diff toxin - Abdominal pain unchanged. Unable to tolerate PO medications at this time. Treatment with Toradol unsuccessful. Give morphine IV 1 mg q1h prn - GI symptoms have been unchanged for several days despite resolution of fever and decrease in WBCs. Seek GI consult.   Migraine: - Headache still present. Repeat Imitrex 6 mg sub-q once  Gastroparesis:  - Reglan 10 mg injection - Erythromycin 250 mg q8h - Zofran 4 mg prn - Phenergan 12.5 mg PRN  GERD:  - Protonix 40 mg injection qd  Eosinophilic Esophagitis: - Continue Fluticasone inhaler - Continue Pulmicort  Inflammatory Eye  disease:  -  Continue Restasis 1 drop, bot eyes, bid - Olopatadine 1 drop, both eyes, bid - Prednisolone 1 drop, left eye bid - Polyvinyl alcohol 1 drop, prn, dry eyes - atropine 1 drop, prn, dry eyes   LOS: 4 days   [name] Horn, Medical Student 08/12/2019, 8:49 AM

## 2019-08-13 ENCOUNTER — Inpatient Hospital Stay (HOSPITAL_COMMUNITY): Payer: Managed Care, Other (non HMO)

## 2019-08-13 LAB — CBC
HCT: 37.2 % (ref 36.0–46.0)
Hemoglobin: 12.4 g/dL (ref 12.0–15.0)
MCH: 31 pg (ref 26.0–34.0)
MCHC: 33.3 g/dL (ref 30.0–36.0)
MCV: 93 fL (ref 80.0–100.0)
Platelets: 268 10*3/uL (ref 150–400)
RBC: 4 MIL/uL (ref 3.87–5.11)
RDW: 12.2 % (ref 11.5–15.5)
WBC: 8.4 10*3/uL (ref 4.0–10.5)
nRBC: 0 % (ref 0.0–0.2)

## 2019-08-13 LAB — BASIC METABOLIC PANEL
Anion gap: 9 (ref 5–15)
BUN: 8 mg/dL (ref 6–20)
CO2: 20 mmol/L — ABNORMAL LOW (ref 22–32)
Calcium: 9 mg/dL (ref 8.9–10.3)
Chloride: 111 mmol/L (ref 98–111)
Creatinine, Ser: 1.04 mg/dL — ABNORMAL HIGH (ref 0.44–1.00)
GFR calc Af Amer: >60 mL/min (ref >60)
GFR calc non Af Amer: >60 mL/min (ref >60)
Glucose, Bld: 98 mg/dL (ref 70–99)
Potassium: 3.3 mmol/L — ABNORMAL LOW (ref 3.5–5.1)
Sodium: 140 mmol/L (ref 135–145)

## 2019-08-13 LAB — C DIFFICILE QUICK SCREEN W PCR REFLEX
C Diff antigen: NEGATIVE
C Diff interpretation: NOT DETECTED
C Diff toxin: NEGATIVE

## 2019-08-13 MED ORDER — POTASSIUM CHLORIDE 10 MEQ/100ML IV SOLN
10.0000 meq | Freq: Once | INTRAVENOUS | Status: AC
  Administered 2019-08-13: 10 meq via INTRAVENOUS
  Filled 2019-08-13: qty 100

## 2019-08-13 MED ORDER — MORPHINE SULFATE (PF) 2 MG/ML IV SOLN
1.0000 mg | Freq: Two times a day (BID) | INTRAVENOUS | Status: DC | PRN
  Administered 2019-08-13: 1 mg via INTRAVENOUS
  Filled 2019-08-13: qty 1

## 2019-08-13 MED ORDER — MORPHINE SULFATE (PF) 2 MG/ML IV SOLN
1.0000 mg | Freq: Three times a day (TID) | INTRAVENOUS | Status: DC | PRN
  Administered 2019-08-13 – 2019-08-14 (×2): 1 mg via INTRAVENOUS
  Filled 2019-08-13 (×3): qty 1

## 2019-08-13 MED ORDER — POTASSIUM CHLORIDE 20 MEQ PO PACK
40.0000 meq | PACK | Freq: Once | ORAL | Status: DC
  Filled 2019-08-13: qty 2

## 2019-08-13 MED ORDER — POTASSIUM CHLORIDE CRYS ER 20 MEQ PO TBCR: 40.0000 meq | EXTENDED_RELEASE_TABLET | Freq: Once | ORAL | Status: DC

## 2019-08-13 MED ORDER — MORPHINE SULFATE (PF) 2 MG/ML IV SOLN
2.0000 mg | Freq: Once | INTRAVENOUS | Status: AC
  Administered 2019-08-13: 2 mg via INTRAVENOUS
  Filled 2019-08-13: qty 1

## 2019-08-13 MED ORDER — POTASSIUM CHLORIDE 10 MEQ/100ML IV SOLN
10.0000 meq | INTRAVENOUS | Status: AC
  Administered 2019-08-13 (×3): 10 meq via INTRAVENOUS
  Filled 2019-08-13 (×3): qty 100

## 2019-08-13 MED ORDER — IOHEXOL 300 MG/ML  SOLN
100.0000 mL | Freq: Once | INTRAMUSCULAR | Status: AC | PRN
  Administered 2019-08-13: 100 mL via INTRAVENOUS

## 2019-08-13 NOTE — Progress Notes (Signed)
[facility] Progress Note  [name] Horn 43 y.o. 1976-09-03   Subjective: Complains of ongoing abdominal pain. Loose stools overnight and today with report of blood mixed in (now reports before stools were mostly blood - not documented in chart)  Objective: Vital signs: Vitals:   08/13/19 0758 08/13/19 0920  BP:  103/63  Pulse:  74  Resp:  18  Temp:  98.8 F (37.1 C)  SpO2: 97% 97%    Physical Exam: Gen: lethargic, mild acute distress, well-nourished  HEENT: anicteric sclera CV: RRR Chest: CTA B Abd: diffuse tenderness with guarding, soft, nondistended, +BS Ext: no edema  Lab Results: Recent Labs    08/13/19 0535  NA 140  K 3.3*  CL 111  CO2 20*  GLUCOSE 98  BUN 8  CREATININE 1.04*  CALCIUM 9.0   No results for input(s): AST, ALT, ALKPHOS, BILITOT, PROT, ALBUMIN in the last 72 hours. Recent Labs    08/12/19 0714 08/13/19 0535  WBC 10.7* 8.4  HGB 12.9 12.4  HCT 38.8 37.2  MCV 94.4 93.0  PLT 243 268      Assessment/Plan: Chronic diarrhea likely from her IBS and radiation proctitis. C. Diff negative. Due to ongoing abdominal pain will do a CT abd/pelvis with contrast to further evaluate. Advised patient to show staff any BMs for documentation. Minimize pain meds. Supportive care. Will f/u.   [name] Horn 08/13/2019, 12:41 PM  Questions please call [phone] ID: [name] Horn, female   DOB: 18-May-1976, 43 y.o.   MRN: [id number]

## 2019-08-13 NOTE — Progress Notes (Signed)
Pt continues to c/o 10/10 pain (headache and pain in the abdomen). Pt crying from the pain. Morphine PRN frequency has been changed but pt with little to no relief. Notified MD Percell Locus via phone call regarding PRN medications. Pt refuses tylenol as it does not help her pain and morphine is scheduled every 8 hours. Per MD she will review chart and add orders.   Paulla Fore, RN

## 2019-08-13 NOTE — Progress Notes (Signed)
Subjective:  Ms. Acord is awake and alert. She describes continuing abdominal pain, nausea and vomiting, and frequent liquid bowel movements, unchanged from yesterday. She is still unable to tolerate PO intake beyond the occasional sprite and broth. She reports that her headache was helped by Imitrex yesterday but has since returned.   Today we discussed stopping her antibiotics since it is likely that her fevers were not from an infectious source and that the antibiotics may be contributing to her nausea. We also discussed pain management. Ms. Pfohl feels that the only thing that has helped is IV morphine. She will continue to have this available for severe pain but understands that it may also be contributing to her symptoms. She agrees with the plan to minimize its use as much as tolerated.     Objective:  Vital signs in last 24 hours: Vitals:   08/12/19 2053 08/13/19 0539 08/13/19 0758 08/13/19 0920  BP: 113/65 (!) 91/55  103/63  Pulse: 85 72  74  Resp: 18 20  18   Temp: 99.1 F (37.3 C) 98.5 F (36.9 C)  98.8 F (37.1 C)  TempSrc: Oral Oral  Oral  SpO2: 97% 96% 97% 97%  Weight:      Height:       Weight change:   Intake/Output Summary (Last 24 hours) at 08/13/2019 1102 Last data filed at 08/13/2019 0900 Gross per 24 hour  Intake 360 ml  Output 800 ml  Net -440 ml   CBC Latest Ref Rng & Units 08/13/2019 08/12/2019 08/11/2019  WBC 4.0 - 10.5 K/uL 8.4 10.7(H) 13.2(H)  Hemoglobin 12.0 - 15.0 g/dL 12.4 12.9 13.7  Hematocrit 36.0 - 46.0 % 37.2 38.8 40.2  Platelets 150 - 400 K/uL 268 243 245   BMP Latest Ref Rng & Units 08/13/2019 08/10/2019 08/08/2019  Glucose 70 - 99 mg/dL 98 132(H) 95  BUN 6 - 20 mg/dL 8 8 11   Creatinine 0.44 - 1.00 mg/dL 1.04(H) 1.06(H) 0.94  Sodium 135 - 145 mmol/L 140 143 141  Potassium 3.5 - 5.1 mmol/L 3.3(L) 4.1 3.5  Chloride 98 - 111 mmol/L 111 111 114(H)  CO2 22 - 32 mmol/L 20(L) 21(L) 19(L)  Calcium 8.9 - 10.3 mg/dL 9.0 9.3 8.4(L)   Physical Exam:   General: well-nourished, non-toxic appearing but uncomfortable, laying in bed HEENT: NCAT, sclerae non-icteric  CV: No edema.  Pulm: normal work of breathing Abd: normal bowel sounds. Soft, non-distended, tender to palpation. No guarding.  Neuro: A/Ox3, conversant with normal speech, no gross motor defects    Assessment/Plan:  Principal Problem:   Gastroparesis Active Problems:   Constipation  Ms. Buczkowski is having persistent GI symptoms. She presented with a gastroparesis flair and significant constipation which was resolved with a SMOG enema (8/14). Soon afterwards, she experienced fever to 101.6 with an elevated WBC count to 14.7. Zosyn was added. Her temp has since resolved and WBCs have decreased to 10.7, but she continues to have frequent, liquid, bloody stools associated with severe abdominal pain. She also has constant nausea and occasional vomiting and is unable to tolerate anything PO. This is likely due to her many underlying GI conditions including radiation proctitis and IBS. She is also currently on erythromycin and zosyn which can have irritating effects on the gut and may be contributing to her nausea.   Abdominal pain with diarrhea and fever:  - Fever resolved. Temp 98.5 - WBC: 8.4 from 13.2 - No acute findings on abdominal xray (8/15) - Low concern for  infection at this time. Given worsening nausea and GI irritation, d/c erythromycin and zosyn.  - C. diff negative - Abdominal pain unchanged. Unable to tolerate PO medications at this time. Standing order for IV morphine prn with patient understanding that it may be contributing to symptoms despite pain relief and should be used only with severe pain.  - Repeat abdominal CT if symptoms persist   Migraine: - Headache still present. Continue home Topamax and Imitrex.   Gastroparesis:  - Reglan 10 mg injection - Zofran 4 mg prn - Phenergan 12.5 mg PRN  GERD:  - Protonix 40 mg injection qd  Eosinophilic Esophagitis:  - Continue Fluticasone inhaler - Continue Pulmicort  Inflammatory Eye disease:  -  Continue Restasis 1 drop, bot eyes, bid - Olopatadine 1 drop, both eyes, bid - Prednisolone 1 drop, left eye bid - Polyvinyl alcohol 1 drop, prn, dry eyes - atropine 1 drop, prn, dry eyes   LOS: 5 days   Kendell Bane, Medical Student 08/13/2019, 11:02 AM

## 2019-08-13 NOTE — Progress Notes (Signed)
Initial Nutrition Assessment  DOCUMENTATION CODES:   Not applicable  INTERVENTION:    Ensure Enlive po BID, each supplement provides 350 kcal and 20 grams of protein once diet advanced   MVI daily   NUTRITION DIAGNOSIS:   Inadequate oral intake related to diarrhea, nausea as evidenced by per patient/family report.  GOAL:   Patient will meet greater than or equal to 90% of their needs  MONITOR:   PO intake, Supplement acceptance, Weight trends, Labs, Diet advancement, Skin, I & O's  REASON FOR ASSESSMENT:   NPO/Clear Liquid Diet    ASSESSMENT:   Patient with PMH significant for GERD, gastroparesis, CF Menelik esophagitis, diverticulosis, radiation proctitis due to uterine cancer, and multiple abdominal surgeries. Presents this admission with constipation and gastroparesis.   Pt received SMOG enema 8/12 and has had ongoing diarrhea since. Spoke with with pt at bedside. Appetite has been decreased over the last couple of days due to pain from constipation. Pt typically sticks to a liquid diet as her GERD and gastroparesis limit food options. She drinks Ensure Plus throughout the day and will eat bland foods. She has not been able to tolerate a liquid diet this admission. She is seems to be very aware that protein is important. She does not wish to use clear supplementation as it makes her nauseous but will take Ensure once diet is advanced.   Pt endorses a UBW of 185 lb and denies any recent wt loss. Records indicate pt weighed 170 lb on 6/9 and 182 lb this admission.    I/O: +9,184 ml since admit UOP: 800 ml x 24 hrs    Drips: 10 mEq KCl Medications: 10 mg reglan QID, prednisolone Labs: K 3.3 (L)   NUTRITION - FOCUSED PHYSICAL EXAM:    Most Recent Value  Orbital Region  No depletion  Upper Arm Region  No depletion  Thoracic and Lumbar Region  Unable to assess  Buccal Region  No depletion  Temple Region  Mild depletion  Clavicle Bone Region  No depletion  Clavicle  and Acromion Bone Region  No depletion  Scapular Bone Region  Unable to assess  Dorsal Hand  No depletion  Patellar Region  No depletion  Anterior Thigh Region  No depletion  Posterior Calf Region  No depletion  Edema (RD Assessment)  None  Hair  Reviewed  Eyes  Reviewed  Mouth  Reviewed  Skin  Reviewed  Nails  Reviewed     Diet Order:   Diet Order            Diet clear liquid Room service appropriate? Yes; Fluid consistency: Thin  Diet effective now              EDUCATION NEEDS:   Education needs have been addressed  Skin:  Skin Assessment: Reviewed RN Assessment  Last BM:  8/18  Height:   Ht Readings from Last 1 Encounters:  08/10/19 5\' 6"  (1.676 m)    Weight:   Wt Readings from Last 1 Encounters:  08/10/19 82.5 kg    Ideal Body Weight:  59.1 kg  BMI:  Body mass index is 29.36 kg/m.  Estimated Nutritional Needs:   Kcal:  1750-1950 kcal  Protein:  85-100 grams  Fluid:  >/= 1.7 L/day   Mariana Single RD, LDN Clinical Nutrition Pager # - [phone]

## 2019-08-14 DIAGNOSIS — K529 Noninfective gastroenteritis and colitis, unspecified: Secondary | ICD-10-CM

## 2019-08-14 MED ORDER — LACTATED RINGERS IV SOLN
INTRAVENOUS | Status: AC
  Administered 2019-08-14 – 2019-08-15 (×2): via INTRAVENOUS

## 2019-08-14 MED ORDER — MORPHINE SULFATE (PF) 2 MG/ML IV SOLN
2.0000 mg | Freq: Four times a day (QID) | INTRAVENOUS | Status: DC | PRN
  Administered 2019-08-14 – 2019-08-16 (×7): 2 mg via INTRAVENOUS
  Filled 2019-08-14 (×6): qty 1

## 2019-08-14 MED ORDER — SUMATRIPTAN 20 MG/ACT NA SOLN
20.0000 mg | NASAL | Status: DC | PRN
  Administered 2019-08-14 – 2019-08-15 (×2): 20 mg via NASAL
  Filled 2019-08-14 (×2): qty 1

## 2019-08-14 MED ORDER — POTASSIUM CHLORIDE 10 MEQ/100ML IV SOLN
10.0000 meq | INTRAVENOUS | Status: AC
  Administered 2019-08-14 (×3): 10 meq via INTRAVENOUS
  Filled 2019-08-14 (×3): qty 100

## 2019-08-14 NOTE — Progress Notes (Signed)
[facility] Gastroenterology Progress Note  [name] Horn 43 y.o. April 07, 1976   Subjective: Diarrhea much less and feels abdominal pain better controlled. Nurse present during my evaluation.  Objective: Vital signs: Vitals:   08/14/19 0700 08/14/19 1017  BP:  (!) 94/52  Pulse: 78 73  Resp: 18 18  Temp:  98.3 F (36.8 C)  SpO2: 99% 99%  Tm 99.6  Physical Exam: Gen: lethargic, no acute distress  HEENT: anicteric sclera CV: RRR Chest: CTA B Abd: diffuse tenderness (L > R) with guarding, soft, nondistended, +BS Ext: no edema  Lab Results: Recent Labs    08/13/19 0535  NA 140  K 3.3*  CL 111  CO2 20*  GLUCOSE 98  BUN 8  CREATININE 1.04*  CALCIUM 9.0   No results for input(s): AST, ALT, ALKPHOS, BILITOT, PROT, ALBUMIN in the last 72 hours. Recent Labs    08/12/19 0714 08/13/19 0535  WBC 10.7* 8.4  HGB 12.9 12.4  HCT 38.8 37.2  MCV 94.4 93.0  PLT 243 268      Assessment/Plan: Abdominal pain and diarrhea with CT scan yesterday showing colitis of the distal descending colon and sigmoid colon in the setting of a history of radiation proctitis who had constipation prior to admit requiring enemas. T 99.6 last night. She could have had a bout of ischemic colitis that is resolving but clinically she is improving and due to her history of C. Diff would avoid starting additional antibiotics since she is improving with bowel rest, IVFs, and supportive care. Reassurance given. Slowly advance diet. I do not think she needs a flex sig unless hematochezia returns or diarrhea worsens. Needs to ambulate but defer to primary team. Will f/u.   [name] Horn 08/14/2019, 10:54 AM  Questions please call [phone] ID: [name] Horn, female   DOB: [birth year], 43 y.o.   MRN: [id number]

## 2019-08-14 NOTE — Plan of Care (Signed)
  Problem: Clinical Measurements: Goal: Ability to maintain clinical measurements within normal limits will improve Outcome: Progressing   Problem: Elimination: Goal: Will not experience complications related to bowel motility Outcome: Progressing   

## 2019-08-14 NOTE — Progress Notes (Addendum)
Subjective:  [name] Horn is awake and alert. Yesterday PM she was experiencing 10/10 abdominal pain and headache which was improved with IV morphine. Today she describes continuing abdominal pain, nausea and vomiting, and frequent liquid bowel movements, minimally improved from yesterday. Stools appear bloody early in these bouts and less so as the day progresses. She is still unable to tolerate PO intake beyond some occasional juice and has not been able to tolerate PO medication, vomiting within seconds to minutes of taking tylenol.   This morning we discussed the findings on her abdominal CT from yesterday which were consistent with inflammation in the descending and proximal sigmoid colon. She understands that further workup is needed to determine whether this is from an infectious cause or another inflammatory process.     Objective:  Vital signs in last 24 hours: Vitals:   08/13/19 2009 08/13/19 2103 08/14/19 0513 08/14/19 0700  BP:  109/68 105/63   Pulse:  90 81 78  Resp:  18 18 18   Temp:  99.6 F (37.6 C) 98.6 F (37 C)   TempSrc:  Oral Oral   SpO2: 98% 97% 100% 99%  Weight:  82.5 kg    Height:       Weight change:   Intake/Output Summary (Last 24 hours) at 08/14/2019 0937 Last data filed at 08/14/2019 0551 Gross per 24 hour  Intake 540 ml  Output 0 ml  Net 540 ml   CBC Latest Ref Rng & Units 08/13/2019 08/12/2019 08/11/2019  WBC 4.0 - 10.5 K/uL 8.4 10.7(H) 13.2(H)  Hemoglobin 12.0 - 15.0 g/dL 12.4 12.9 13.7  Hematocrit 36.0 - 46.0 % 37.2 38.8 40.2  Platelets 150 - 400 K/uL 268 243 245   BMP Latest Ref Rng & Units 08/13/2019 08/10/2019 08/08/2019  Glucose 70 - 99 mg/dL 98 132(H) 95  BUN 6 - 20 mg/dL 8 8 11   Creatinine 0.44 - 1.00 mg/dL 1.04(H) 1.06(H) 0.94  Sodium 135 - 145 mmol/L 140 143 141  Potassium 3.5 - 5.1 mmol/L 3.3(L) 4.1 3.5  Chloride 98 - 111 mmol/L 111 111 114(H)  CO2 22 - 32 mmol/L 20(L) 21(L) 19(L)  Calcium 8.9 - 10.3 mg/dL 9.0 9.3 8.4(L)   Physical  Exam:  General: non-toxic appearing but uncomfortable, laying in bed HEENT: NCAT, sclerae non-icteric  CV: RRR, no m/r/g appreciated. No edema. All extremities warm and well-perfused.   Pulm: normal work of breathing Abd: active bowel sounds. Soft, non-distended, tender to palpation. No guarding.  Neuro: A/Ox3, conversant with normal speech, no gross motor defects    Assessment/Plan:  Principal Problem:   Gastroparesis Active Problems:   Constipation  [name] Horn is having persistent GI symptoms. She presented with a gastroparesis flair and significant constipation which was resolved with a SMOG enema (8/14). Soon afterwards, she experienced fever to 101.6 with an elevated WBC count to 14.7. Zosyn was added. Her temp has since resolved and WBCs have decreased to 8.4, but she continues to have frequent, sometimes bloody liquid stools associated with severe abdominal pain. She also has constant nausea and occasional vomiting and is unable to tolerate PO medictions. Erythromycin and zosyn were discontinued yesterday to help relieve nausea symptoms with minimal improvement. Abdominal CT (8/18) showed and unremarkable small bowel and thickening of the bowel wall in the descending and proximal sigmoid colon consistent with colitis. This could be from an infectious cause or another inflammatory process. We will coordinate with GI for further workup.   Abdominal pain with diarrhea:  - No  acute findings on abdominal xray (8/15) - Abdominal CT (8/18) shows bowel wall thickening consistent with colitis in the descending and proximal sigmoid colon.  - C. diff negative - GI consult for potential repeat colonoscopy  - IV morphine available q6h prn  Migraine: - Continue home Topamax and Imitrex.   Gastroparesis:  - Reglan 10 mg injection - Zofran 4 mg prn - Phenergan 12.5 mg PRN  GERD:  - Protonix 40 mg injection qd  Eosinophilic Esophagitis: - Continue Fluticasone inhaler - Continue Pulmicort   Inflammatory Eye disease:  - Continue Restasis 1 drop, bot eyes, bid - Olopatadine 1 drop, both eyes, bid - Prednisolone 1 drop, left eye bid - Polyvinyl alcohol 1 drop, prn, dry eyes - atropine 1 drop, prn, dry eyes   LOS: 6 days   Kendell Bane, Medical Student 08/14/2019, 9:37 AM

## 2019-08-14 NOTE — Plan of Care (Signed)
  Problem: Clinical Measurements: Goal: Ability to maintain clinical measurements within normal limits will improve Outcome: Progressing   

## 2019-08-15 LAB — CULTURE, BLOOD (ROUTINE X 2)
Culture: NO GROWTH
Culture: NO GROWTH
Special Requests: ADEQUATE

## 2019-08-15 LAB — BASIC METABOLIC PANEL
Anion gap: 9 (ref 5–15)
BUN: <5 mg/dL — ABNORMAL LOW (ref 6–20)
CO2: 20 mmol/L — ABNORMAL LOW (ref 22–32)
Calcium: 9.1 mg/dL (ref 8.9–10.3)
Chloride: 109 mmol/L (ref 98–111)
Creatinine, Ser: 0.97 mg/dL (ref 0.44–1.00)
GFR calc Af Amer: >60 mL/min (ref >60)
GFR calc non Af Amer: >60 mL/min (ref >60)
Glucose, Bld: 124 mg/dL — ABNORMAL HIGH (ref 70–99)
Potassium: 3.7 mmol/L (ref 3.5–5.1)
Sodium: 138 mmol/L (ref 135–145)

## 2019-08-15 MED ORDER — SUMATRIPTAN SUCCINATE 25 MG PO TABS
25.0000 mg | ORAL_TABLET | ORAL | Status: DC | PRN
  Administered 2019-08-15 – 2019-08-17 (×3): 25 mg via ORAL
  Filled 2019-08-15 (×5): qty 1

## 2019-08-15 NOTE — Progress Notes (Signed)
[facility] Gastroenterology Progress Note  [name] Horn 43 y.o. 1976/10/03   Subjective: Reports a loose nonbloody stool overnight with incontinence. Feels a little better. No appetite but tolerating clear liquids. Resting comfortably upon my entering room. Denies walking outside of the room.  Objective: Vital signs: Vitals:   08/15/19 0517 08/15/19 1007  BP: 94/69 95/60  Pulse: 93 74  Resp: 17 18  Temp: 98.6 F (37 C) 97.8 F (36.6 C)  SpO2: 97% 95%    Physical Exam: Gen: lethargic, no acute distress  HEENT: anicteric sclera CV: RRR Chest: CTA B Abd: diffuse tenderness with less guarding, soft, nondistended, +BS Ext: no edema  Lab Results: Recent Labs    08/13/19 0535 08/15/19 0341  NA 140 138  K 3.3* 3.7  CL 111 109  CO2 20* 20*  GLUCOSE 98 124*  BUN 8 <5*  CREATININE 1.04* 0.97  CALCIUM 9.0 9.1   No results for input(s): AST, ALT, ALKPHOS, BILITOT, PROT, ALBUMIN in the last 72 hours. Recent Labs    08/13/19 0535  WBC 8.4  HGB 12.4  HCT 37.2  MCV 93.0  PLT 268      Assessment/Plan: Abdominal pain and diarrhea with focal colitis seen on CT scan - clinically she is slowly improving. She does not want to advance beyond clear liquid diet right now. Continue supportive care. No new GI recs. Will sign off. Call us back if questions.   [name] 08/15/2019, 10:16 AM  Questions please call [phone] ID: [name] Horn, female   DOB: August 14, 1976, 43 y.o.   MRN: [id number]

## 2019-08-15 NOTE — Progress Notes (Signed)
Subjective:  [name] Horn is awake and alert. She reports no improvement in the frequency of bowel movements but is no longer seeing blood in her stool and reports improvement in pain, rating it 7/10. She is less tender to palpation than previously. She has no appetite but is tolerating minimal clear liquids. She reports improvement in her headaches with administration of her home Imitrex.   We discussed today the plan to continue with bowel rest and supportive care as she makes slow progress in recovery.    Objective:  Vital signs in last 24 hours: Vitals:   08/14/19 1721 08/14/19 2053 08/15/19 0517 08/15/19 1007  BP: 97/65 98/68 94/69  95/60  Pulse: 94 79 93 74  Resp: 18 17 17 18   Temp: 98.1 F (36.7 C) 99.5 F (37.5 C) 98.6 F (37 C) 97.8 F (36.6 C)  TempSrc: Oral Oral Oral Oral  SpO2: 100% 98% 97% 95%  Weight:  83.5 kg    Height:       Weight change: 0.985 kg  Intake/Output Summary (Last 24 hours) at 08/15/2019 1151 Last data filed at 08/15/2019 0600 Gross per 24 hour  Intake 1937.88 ml  Output 0 ml  Net 1937.88 ml   CBC Latest Ref Rng & Units 08/13/2019 08/12/2019 08/11/2019  WBC 4.0 - 10.5 K/uL 8.4 10.7(H) 13.2(H)  Hemoglobin 12.0 - 15.0 g/dL 12.4 12.9 13.7  Hematocrit 36.0 - 46.0 % 37.2 38.8 40.2  Platelets 150 - 400 K/uL 268 243 245   BMP Latest Ref Rng & Units 08/15/2019 08/13/2019 08/10/2019  Glucose 70 - 99 mg/dL 124(H) 98 132(H)  BUN 6 - 20 mg/dL <5(L) 8 8  Creatinine 0.44 - 1.00 mg/dL 0.97 1.04(H) 1.06(H)  Sodium 135 - 145 mmol/L 138 140 143  Potassium 3.5 - 5.1 mmol/L 3.7 3.3(L) 4.1  Chloride 98 - 111 mmol/L 109 111 111  CO2 22 - 32 mmol/L 20(L) 20(L) 21(L)  Calcium 8.9 - 10.3 mg/dL 9.1 9.0 9.3   Physical Exam:  General: non-toxic appearing but uncomfortable, laying in bed HEENT: NCAT, sclerae non-icteric  CV: RRR, no m/r/g appreciated. No edema. All extremities warm and well-perfused.   Pulm: normal work of breathing Abd: active bowel sounds. Soft,  non-distended, tender to palpation. No guarding.  Neuro: A/Ox3, conversant with normal speech, no gross motor defects    Assessment/Plan:  Principal Problem:   Gastroparesis Active Problems:   Constipation   Colitis  Ms. Vanderlinden is having persistent GI symptoms. She presented with a gastroparesis flair and significant constipation which was resolved with a SMOG enema (8/14). Soon afterwards, she experienced fever to 101.6 with an elevated WBC count to 14.7. Zosyn was added. Her temp has since resolved and WBCs have decreased to 8.4, but she continues to have frequent, sometimes bloody liquid stools associated with severe abdominal pain. She also has constant nausea and occasional vomiting and is unable to tolerate PO medictions. Erythromycin and zosyn were discontinued to help relieve nausea symptoms with minimal improvement. Abdominal CT (8/18) showed and unremarkable small bowel and thickening of the bowel wall in the descending and proximal sigmoid colon consistent with colitis. GI recommended no further intervention as symptoms do appear to be slowly improving with supportive care.   Abdominal pain with diarrhea:  - No acute findings on abdominal xray (8/15) - Abdominal CT (8/18) shows bowel wall thickening consistent with colitis in the descending and proximal sigmoid colon.  - C. diff negative - IV morphine available q6h prn for severe abdominal pain -  GI consult 8/19 recommended no further evaluation or intervention at this time. Signed off 8/20 - Continue bowel rest with clear liquid diet, IVF, and supportive care.  - If improvement in symptoms continues, consider stepping down analgesia as tolerated  Migraine: - Continue home Topamax and Imitrex.   Gastroparesis:  - Reglan 10 mg injection - Zofran 4 mg prn - Phenergan 12.5 mg PRN  GERD:  - Protonix 40 mg injection qd  Eosinophilic Esophagitis: - Continue Fluticasone inhaler - Continue Pulmicort  Inflammatory Eye disease:   - Continue Restasis 1 drop, bot eyes, bid - Olopatadine 1 drop, both eyes, bid - Prednisolone 1 drop, left eye bid - Polyvinyl alcohol 1 drop, prn, dry eyes - atropine 1 drop, prn, dry eyes   LOS: 7 days   Kendell Bane, Medical Student 08/15/2019, 11:51 AM

## 2019-08-16 MED ORDER — ADULT MULTIVITAMIN W/MINERALS CH
1.0000 | ORAL_TABLET | Freq: Every day | ORAL | Status: DC
  Administered 2019-08-16 – 2019-08-17 (×2): 1 via ORAL
  Filled 2019-08-16 (×2): qty 1

## 2019-08-16 MED ORDER — ENSURE ENLIVE PO LIQD
237.0000 mL | Freq: Two times a day (BID) | ORAL | Status: DC
  Administered 2019-08-16 – 2019-08-17 (×2): 237 mL via ORAL

## 2019-08-16 MED ORDER — OXYCODONE-ACETAMINOPHEN 5-325 MG PO TABS
1.0000 | ORAL_TABLET | Freq: Four times a day (QID) | ORAL | Status: DC | PRN
  Administered 2019-08-16 – 2019-08-17 (×4): 1 via ORAL
  Filled 2019-08-16 (×4): qty 1

## 2019-08-16 NOTE — Evaluation (Signed)
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  Has gastroparesis, temp that has resolved and cleared for c-diff.  PMHx:  hepatic steatosis, HA's, migraine, GERD, esophagitis, uterine CA, chronic diarrhea  Clinical Impression  Pt was seen for mobility and strength, note her control of gait is only an issue on the stairs but handles with railing.  Follow up acutely but should be fine once she returns home.  Expecting her to need to increase endurance with gradual activity, and pt is feeling the same.  Will not order PT to continue at home as she is walking within the parameters of her home situation, and will expect her to resume usual routine.      Follow Up Recommendations No PT follow up    Equipment Recommendations  None recommended by PT    Recommendations for Other Services       Precautions / Restrictions Precautions Precautions: None Restrictions Weight Bearing Restrictions: No      Mobility  Bed Mobility Overal bed mobility: Modified Independent                Transfers Overall transfer level: Modified independent                  Ambulation/Gait Ambulation/Gait assistance: Min guard;Supervision Gait Distance (Feet): 200 Feet Assistive device: None Gait Pattern/deviations: Step-through pattern;Decreased stride length;Narrow base of support Gait velocity: reduced Gait velocity interpretation: <1.31 ft/sec, indicative of household ambulator General Gait Details: pt had gait belt in place just for safety  Stairs Stairs: Yes Stairs assistance: Supervision;Min guard Stair Management: One rail Right;Forwards;Alternating pattern;Step to pattern Number of Stairs: 5 General stair comments: step to descending and  reciprocal ascending'  Wheelchair Mobility    Modified Rankin (Stroke Patients Only)       Balance Overall balance assessment: No apparent balance deficits (not formally assessed) Sitting-balance support: Feet supported Sitting balance-Leahy Scale: Good     Standing balance support: No upper extremity supported Standing balance-Leahy Scale: Good Standing balance comment: fair with gait on hallway and uses rail to climb stairs                              Pertinent Vitals/Pain Pain Assessment: Faces Faces Pain Scale: Hurts little more Pain Location: abd pain with any pressure Pain Intervention(s): Monitored during session;Repositioned    Home Living Family/patient expects to be discharged to:: Private residence Living Arrangements: Spouse/significant other Available Help at Discharge: Family;Available PRN/intermittently Type of Home: House Home Access: Stairs to enter Entrance Stairs-Rails: Right;Left;Can reach both Entrance Stairs-Number of Steps: 2 Home Layout: Two level;Other (Comment)(spiral staircase) Home Equipment: None Additional Comments: has been walking with no AD    Prior Function Level of Independence: Independent         Comments: home with husband, able to do housework and cooking     Hand Dominance   Dominant Hand: Right    Extremity/Trunk Assessment   Upper Extremity Assessment Upper Extremity Assessment: Overall WFL for tasks assessed    Lower Extremity Assessment Lower Extremity Assessment: Generalized weakness    Cervical / Trunk Assessment Cervical / Trunk Assessment: Normal  Communication   Communication: No difficulties  Cognition Arousal/Alertness: Awake/alert Behavior During Therapy: WFL for tasks  assessed/performed Overall Cognitive Status: Within Functional Limits for tasks assessed                                        General Comments General comments (skin integrity, edema, etc.): Pt is  fairly competent to walk but is working harder to descend stairs, with a careful technique to step down    Exercises     Assessment/Plan    PT Assessment Patient needs continued PT services  PT Problem List Decreased strength;Decreased activity tolerance;Decreased balance;Pain       PT Treatment Interventions DME instruction;Gait training;Stair training;Functional mobility training;Therapeutic activities;Therapeutic exercise;Balance training;Neuromuscular re-education;Patient/family education    PT Goals (Current goals can be found in the Care Plan section)  Acute Rehab PT Goals Patient Stated Goal: to go home and to feel better PT Goal Formulation: With patient Time For Goal Achievement: 08/23/19 Potential to Achieve Goals: Good    Frequency Min 3X/week   Barriers to discharge Inaccessible home environment home alone at times, has stairs to enter hosue    Co-evaluation               AM-PAC PT "6 Clicks" Mobility  Outcome Measure Help needed turning from your back to your side while in a flat bed without using bedrails?: None Help needed moving from lying on your back to sitting on the side of a flat bed without using bedrails?: A Little Help needed moving to and from a bed to a chair (including a wheelchair)?: A Little Help needed standing up from a chair using your arms (e.g., wheelchair or bedside chair)?: A Little Help needed to walk in hospital room?: A Little Help needed climbing 3-5 steps with a railing? : A Little 6 Click Score: 19    End of Session Equipment Utilized During Treatment: Gait belt Activity Tolerance: Patient tolerated treatment well Patient left: in bed;with call bell/phone within reach Nurse Communication: Mobility status PT Visit Diagnosis: Unsteadiness on feet (R26.81);Muscle weakness (generalized) (M62.81)    Time: 1115-1140 PT Time Calculation (min) (ACUTE ONLY): 25 min   Charges:   PT Evaluation $PT Eval Moderate Complexity: 1  Mod PT Treatments $Gait Training: 8-22 mins       Ramond Dial 08/16/2019, 1:12 PM   Mee Hives, PT MS Acute Rehab Dept. Number: [place] and [place]

## 2019-08-16 NOTE — Progress Notes (Signed)
Nutrition Follow-up  DOCUMENTATION CODES:   Not applicable  INTERVENTION:  -Ensure Enlive po BID, each supplement provides 350 kcal and 20 grams of protein -MVI daily   NUTRITION DIAGNOSIS:   Inadequate oral intake related to diarrhea, nausea as evidenced by per patient/family report.  Progressing; improvements to BM frequency   GOAL:   Patient will meet greater than or equal to 90% of their needs  Progressing; diet advanced to soft  MONITOR:   PO intake, Supplement acceptance, Weight trends, Labs, Diet advancement, Skin, I & O's  REASON FOR ASSESSMENT:   NPO/Clear Liquid Diet    ASSESSMENT:  RD working remotely.  Patient with PMH significant for GERD, gastroparesis, CF Menelik esophagitis, diverticulosis, radiation proctitis due to uterine cancer, and multiple abdominal surgeries. Presents this admission with constipation and gastroparesis.  Pt received SMOG enema 8/12 and has had ongoing diarrhea since. Per chart review pt reported appetite has been decreased over the last couple of days due to pain from constipation. Pt typically sticks to a liquid diet as her GERD and gastroparesis limit food options. She drinks Ensure Plus throughout the day and will eat bland foods. She has not been able to tolerate a liquid diet this admission. She is seems to be very aware that protein is important. She does not wish to use clear supplementation as it makes her nauseous but will take Ensure once diet is advanced.   Patient reports feeling much better today with improvements to pain and decreased BM frequency. Patient is pleased to be on a soft diet; eating 75% of last documented meal. RD will order Ensure at this time; continue to monitor po intake and toleration of advanced diet.   UBW 185 lb Current wt 81.9 kg (180.2 lb)   I/O: +12021 ml since admit   Diet Order:   Diet Order            DIET SOFT Room service appropriate? Yes; Fluid consistency: Thin  Diet effective now              EDUCATION NEEDS:   Education needs have been addressed  Skin:  Skin Assessment: Reviewed RN Assessment  Last BM:  8/18  Height:   Ht Readings from Last 1 Encounters:  08/10/19 5\' 6"  (1.676 m)    Weight:   Wt Readings from Last 1 Encounters:  08/15/19 81.9 kg    Ideal Body Weight:  59.1 kg  BMI:  Body mass index is 29.14 kg/m.  Estimated Nutritional Needs:   Kcal:  1750-1950 kcal  Protein:  85-100 grams  Fluid:  >/= 1.7 L/day  Lajuan Lines, RD, LDN [facility] Telephone [phone] After Hours/Weekend Pager: [phone]

## 2019-08-16 NOTE — Progress Notes (Signed)
Subjective:  [name] is awake and alert. She states that she feels much better today and reports improvement in pain and a decrease in bowel movement frequency. Her headaches have been will controlled with her home Imitrex. She is pleased to be on a soft diet. We will follow up to see how well she tolerates that. She has not had trouble getting out of bed to get to the bathroom but will be evaluated by PT for any recommendations that could make things easier for her at home.    Objective:  Vital signs in last 24 hours: Vitals:   08/15/19 1007 08/15/19 1727 08/15/19 2144 08/16/19 0439  BP: 95/60 103/75 103/63 (!) 91/58  Pulse: 74 85 73 72  Resp: 18 18 18 17   Temp: 97.8 F (36.6 C) 98.4 F (36.9 C) 98.2 F (36.8 C) 98.1 F (36.7 C)  TempSrc: Oral Oral Oral Oral  SpO2: 95% 96% 95% 97%  Weight:   81.9 kg   Height:       Weight change: -1.6 kg  Intake/Output Summary (Last 24 hours) at 08/16/2019 0939 Last data filed at 08/16/2019 [id number] Gross per 24 hour  Intake 360 ml  Output 0 ml  Net 360 ml   CBC Latest Ref Rng & Units 08/13/2019 08/12/2019 08/11/2019  WBC 4.0 - 10.5 K/uL 8.4 10.7(H) 13.2(H)  Hemoglobin 12.0 - 15.0 g/dL 12.4 12.9 13.7  Hematocrit 36.0 - 46.0 % 37.2 38.8 40.2  Platelets 150 - 400 K/uL 268 243 245   BMP Latest Ref Rng & Units 08/15/2019 08/13/2019 08/10/2019  Glucose 70 - 99 mg/dL 124(H) 98 132(H)  BUN 6 - 20 mg/dL <5(L) 8 8  Creatinine 0.44 - 1.00 mg/dL 0.97 1.04(H) 1.06(H)  Sodium 135 - 145 mmol/L 138 140 143  Potassium 3.5 - 5.1 mmol/L 3.7 3.3(L) 4.1  Chloride 98 - 111 mmol/L 109 111 111  CO2 22 - 32 mmol/L 20(L) 20(L) 21(L)  Calcium 8.9 - 10.3 mg/dL 9.1 9.0 9.3   Physical Exam:  General: non-toxic, comfortable in bed HEENT: NCAT, sclerae non-icteric  CV: No edema. All extremities warm and well-perfused.   Pulm: normal work of breathing Abd: active bowel sounds. Soft, non-distended, mildly tender to palpation. No guarding.  Neuro: A/Ox3, conversant  with normal speech, no gross motor defects    Assessment/Plan:  Principal Problem:   Gastroparesis Active Problems:   Constipation   Colitis  [name] presented with a gastroparesis flair and significant constipation which was resolved with a SMOG enema (8/14). Soon afterwards, she experienced fever to 101.6 with an elevated WBC count to 14.7. Zosyn was added. Her temp has since resolved and WBCs have decreased to 8.4, but she continues to have frequent, sometimes bloody liquid stools associated with severe abdominal pain. She also has constant nausea and occasional vomiting and is unable to tolerate PO medictions. Erythromycin and zosyn were discontinued to help relieve nausea symptoms with minimal improvement. Abdominal CT (8/18) showed and unremarkable small bowel and thickening of the bowel wall in the descending and proximal sigmoid colon consistent with colitis. GI recommended no further intervention as symptoms do appear to be slowly improving with supportive care. Today she reports notable improvement in her pain and frequency. She has been advanced to a soft diet. If she can tolerate that, she will likely be ready for discharge in 1-2 days.   Abdominal pain with diarrhea:  - No acute findings on abdominal xray (8/15) - Abdominal CT (8/18) shows bowel wall thickening  consistent with colitis in the descending and proximal sigmoid colon.  - GI consult 8/19 recommended no further evaluation or intervention at this time. Signed off 8/20 - Transition to oral pain medication. Oxycodone-acetaminophen 1 tablet q6h prn - Soft diet  Migraine: - Continue home Topamax and Imitrex.   Gastroparesis:  - Reglan 10 mg injection - Zofran 4 mg prn - Phenergan 12.5 mg PRN  GERD:  - Protonix 40 mg injection qd  Eosinophilic Esophagitis: - Continue Fluticasone inhaler - Continue Pulmicort  Inflammatory Eye disease:  - Continue Restasis 1 drop, bot eyes, bid - Olopatadine 1 drop, both eyes,  bid - Prednisolone 1 drop, left eye bid - Polyvinyl alcohol 1 drop, prn, dry eyes - atropine 1 drop, prn, dry eyes  Dispo: Once [name] can tolerate a diet to support her nutritional needs and has pain controlled with oral medications, she will be ready for discharge. Within 1-2 days.     LOS: 8 days   Kendell Bane, Medical Student 08/16/2019, 9:39 AM

## 2019-08-16 NOTE — Plan of Care (Signed)
  Problem: Nutrition: Goal: Adequate nutrition will be maintained Outcome: Progressing   Problem: Coping: Goal: Level of anxiety will decrease Outcome: Progressing   

## 2019-08-17 DIAGNOSIS — R109 Unspecified abdominal pain: Secondary | ICD-10-CM

## 2019-08-17 MED ORDER — OXYCODONE-ACETAMINOPHEN 5-325 MG PO TABS: 1.0000 | ORAL_TABLET | Freq: Four times a day (QID) | ORAL | 0 refills | Status: DC | PRN

## 2019-08-17 NOTE — Progress Notes (Signed)
[place] to be discharged Home per MD order. Discussed prescriptions and follow up appointments with the patient. Prescriptions given to patient; medication list explained in detail. Patient verbalized understanding.  Skin clean, dry and intact without evidence of skin break down, no evidence of skin tears noted. IV catheter discontinued intact. Site without signs and symptoms of complications. Dressing and pressure applied. Pt denies pain at the site currently. No complaints noted.  Patient free of lines, drains, and wounds.   An After Visit Summary (AVS) was printed and given to the patient. Patient escorted via wheelchair, and discharged home via private auto.  Arlyss Repress, RN

## 2019-08-17 NOTE — Discharge Summary (Signed)
Name: [name] Horn MRN: [id number] DOB: 1976/02/17 42 y.o. PCP: [name] Peter, MD  Date of Admission: 08/06/2019  8:33 PM Date of Discharge: 08/17/2019 Attending Physician: [name] Kilts, MD  Discharge Diagnosis: 1. Gastroparesis flare 2. Constipation / Colitis   Discharge Medications: Allergies as of 08/17/2019      Reactions   Sulfa Antibiotics Anaphylaxis, Shortness Of Breath, Swelling, Rash   Angioedema (also)   Sulfonamide Derivatives Hives, Shortness Of Breath, Swelling   TONGUE SWELLS   Benadryl [diphenhydramine Hcl] Other (See Comments)   Hyperactivity      Medication List    TAKE these medications   amphetamine-dextroamphetamine 20 MG tablet Commonly known as: ADDERALL Take 20 mg by mouth 3 (three) times daily.   atropine 1 % ophthalmic solution Place 1 drop into the left eye daily as needed (for dry eye).   Bepreve 1.5 % Soln Generic drug: Bepotastine Besilate Place 1 drop into both eyes 2 (two) times a day.   cycloSPORINE 0.05 % ophthalmic emulsion Commonly known as: RESTASIS Place 1 drop into both eyes 2 (two) times daily.   dicyclomine 10 MG capsule Commonly known as: BENTYL Take 10 mg by mouth daily as needed for nausea/vomiting.   fluticasone 220 MCG/ACT inhaler Commonly known as: FLOVENT HFA Inhale 2 puffs into the lungs 2 (two) times daily.   furosemide 20 MG tablet Commonly known as: LASIX Take 20 mg by mouth every Monday, Wednesday, and Friday.   hyoscyamine 0.125 MG tablet Commonly known as: LEVSIN Take 0.125 mg by mouth every 4 (four) hours as needed for cramping.   metoCLOPramide 10 MG tablet Commonly known as: REGLAN Take 10 mg by mouth daily.   ondansetron 4 MG disintegrating tablet Commonly known as: ZOFRAN-ODT Take 1 tablet (4 mg total) by mouth every 8 (eight) hours as needed for nausea or vomiting.   oxyCODONE-acetaminophen 5-325 MG tablet Commonly known as: PERCOCET/ROXICET Take 1 tablet by mouth every 6  (six) hours as needed for moderate pain.   pantoprazole 40 MG tablet Commonly known as: Protonix Take 1 tablet (40 mg total) by mouth 2 (two) times daily.   potassium chloride 10 MEQ tablet Commonly known as: K-DUR Take 10 mEq by mouth daily.   prednisoLONE acetate 1 % ophthalmic suspension Commonly known as: PRED FORTE Place 1 drop into the left eye 2 (two) times daily.   promethazine 25 MG tablet Commonly known as: PHENERGAN Take 25 mg by mouth every 6 (six) hours as needed for nausea or vomiting.   promethazine 25 MG suppository Commonly known as: PHENERGAN Place 1 suppository (25 mg total) rectally every 6 (six) hours as needed for nausea or vomiting.   SUMAtriptan 25 MG tablet Commonly known as: IMITREX Take 25 mg by mouth every 2 (two) hours as needed for migraine. May repeat in 2 hours if headache persists or recurs.   Systane 0.4-0.3 % Gel ophthalmic gel Generic drug: Polyethyl Glycol-Propyl Glycol Place 1 application into both eyes daily as needed (dryness).   topiramate 25 MG tablet Commonly known as: TOPAMAX Take 25 mg by mouth at bedtime.   topiramate 100 MG tablet Commonly known as: TOPAMAX Take 100 mg by mouth daily.   traZODone 150 MG tablet Commonly known as: DESYREL Take 150 mg by mouth at bedtime as needed for sleep.     Disposition and follow-up:   [name] was discharged from [facility] in Stable condition.  At the hospital follow up visit please address:  1.  Gastroparesis. Ensure the patient follows up with GI.   2.  Labs / imaging needed at time of follow-up: None  3.  Pending labs/ test needing follow-up: None  Follow-up Appointments: Follow-up Information    [name] Peter, MD. Call.   Specialty: Internal Medicine Contact information: [place] [id number] [facility] Course by problem list:  1. Gastroparesis flare. [name] Horn is a 43 y.o  female with a history of eosinophilic esophagitis, gastroparesis, IBD and radiation proctitis who presented to the hospital on 8/12 with early satiety and nausea/vomiting. She was placed on NPO and started on IV Reglan. Her diet was advanced liquids however she continues to have nausea/vomiting. She was subsequently started on IV erythromycin. She continues to have persistent nausea/vomiting and gastroenterology was consulted. They recommended discontinuing the erythromycin, continuing Reglan, and symptomatic management. Over the course of her hospitalization her nausea/vomiting returned to baseline and she was able to tolerate a regular diet. She was discharged with instructions to follow-up with gastroenterology as soon as possible.  2. Constipation / Colitis. On admission the patient had an abdominal x-ray illustrated significant stool burden. She was subsequently given three antibiotics in a stepwise fashion. She then began to have diarrhea and abdominal pain. CT abdomen was obtained that illustrated colitis. She was treated with five days of Zosyn and symptomatic management. Again gastroenterology recommended symptomatic management. Over the course of her hospitalization her diarrhea and abdominal pain significantly improved. She was discharged in stable condition.  Discharge Vitals:   BP 90/70 (BP Location: Right Arm)   Pulse 69   Temp 98.2 F (36.8 C) (Oral)   Resp 18   Ht 5\' 6"  (1.676 m)   Wt 82.2 kg   SpO2 100%   BMI 29.26 kg/m   Pertinent Labs, Studies, and Procedures:   CT Abdomen W/Contrast  1. Colitis involving the distal descending and proximal sigmoid colon, likely infectious or inflammatory. 2. Mild hepatic steatosis.  Discharge Instructions: Discharge Instructions    Call MD for:  persistant nausea and vomiting   Complete by: As directed    Diet - low sodium heart healthy   Complete by: As directed    Discharge instructions   Complete by: As directed    Thank you for  allowing [place] to provide your care. We have not made any changes to her medications. It will be important that you follow-up with her primary care doctor and your gastroenterologist as soon as you can.   Increase activity slowly   Complete by: As directed     Signed: Ina Homes, MD 08/17/2019, 11:51 AM

## 2019-08-17 NOTE — Progress Notes (Signed)
[place] to be discharged Home per MD order. Discussed prescriptions and follow up appointments with the patient. Prescriptions given to patient; medication list explained in detail. Patient verbalized understanding.  Skin clean, dry and intact without evidence of skin break down, no evidence of skin tears noted. IV catheter discontinued intact. Site without signs and symptoms of complications. Dressing and pressure applied. Pt denies pain at the site currently. No complaints noted.  Patient free of lines, drains, and wounds.   An After Visit Summary (AVS) was printed and given to the patient. Patient escorted via wheelchair, and discharged home via private auto.  Arlyss Repress, RN

## 2019-08-17 NOTE — Progress Notes (Signed)
   Subjective:  [name] Horn is doing well this morning. She reports that her bowel movements are decreasing. Over the last 24 hours she has only had six. She is tolerating PO intake. She had some chicken and green beans last night and a bowl of cereal. We discussed discharged home today after lunch. She has all her antiemetics at home. She will follow-up with her gastroenterologist. All questions and concerns addressed.  Objective:  Vital signs in last 24 hours: Vitals:   08/16/19 1809 08/16/19 1942 08/17/19 0430 08/17/19 0900  BP: (!) 92/53 (!) 87/56 (!) 114/97 90/70  Pulse: 71 79 (!) 59 69  Resp: 18 16 16 18   Temp: 98.9 F (37.2 C) 98.8 F (37.1 C) 98.1 F (36.7 C) 98.2 F (36.8 C)  TempSrc: Oral Oral Oral Oral  SpO2: 97% 94% 98% 100%  Weight:   82.2 kg   Height:       General: Well nourished female in no acute distress Pulm: Good air movement with no wheezing or crackles  CV: RRR, no murmurs, no rubs  Abdomen: Active bowel sounds, soft, non-distended, no tenderness to palpation   Assessment/Plan:  Principal Problem:   Gastroparesis Active Problems:   Constipation   Colitis  [name] Horn presented with a gastroparesis flair and significant constipation which was resolved with a SMOG enema (8/14). Soon afterwards, she experienced fever to 101.6 with an elevated WBC count to 14.7. Zosyn was added. Her temp has since resolved and WBCs have decreased to 8.4, but she continues to have frequent, sometimes bloody liquid stools associated with severe abdominal pain. She also has constant nausea and occasional vomiting and is unable to tolerate PO medictions. Erythromycin and zosyn were discontinued to help relieve nausea symptoms with minimal improvement. Abdominal CT (8/18) showed and unremarkable small bowel and thickening of the bowel wall in the descending and proximal sigmoid colon consistent with colitis. GI recommended no further intervention as symptoms do appear to be slowly  improving with supportive care. Today she reports notable improvement in her pain and frequency. She has been advanced to a soft diet. If she can tolerate that, she will likely be ready for discharge in 1-2 days.   Abdominal pain with diarrhea:  - No acute findings on abdominal xray (8/15) - Abdominal CT (8/18) shows bowel wall thickening consistent with colitis in the descending and proximal sigmoid colon.  - GI consult 8/19 recommended no further evaluation or intervention at this time. Signed off 8/20 - Oxycodone-acetaminophen 1 tablet q6h prn - Soft diet  Migraine: - Continue home Topamax and Imitrex.   Gastroparesis:  - Reglan 10 mg PO - Zofran 4 mg prn - Phenergan 12.5 mg PRN  GERD:  - Protonix 40 mg QD  Eosinophilic Esophagitis: - Continue Fluticasone inhaler - Continue Pulmicort  Inflammatory Eye disease:  - Continue Restasis 1 drop, bot eyes, bid - Olopatadine 1 drop, both eyes, bid - Prednisolone 1 drop, left eye bid - Polyvinyl alcohol 1 drop, prn, dry eyes - atropine 1 drop, prn, dry eyes  Dispo: Discharge today    LOS: 9 days   Ina Homes, MD 08/17/2019, 11:06 AM

## 2019-09-11 ENCOUNTER — Ambulatory Visit: Payer: Managed Care, Other (non HMO) | Admitting: Rheumatology

## 2019-09-18 NOTE — Progress Notes (Signed)
Office Visit Note  Patient: [name] Horn             Date of Birth: 07-20-1976           MRN: [id number]             PCP: Burnadette Peter, MD Referring: Warden Fillers, MD Visit Date: 10/02/2019 Occupation: Corporate treasurer  Subjective:  Dry eyes.   History of Present Illness: [name] Horn is a 43 y.o. female seen in consultation per request of Dr. Warden Fillers.  According to patient about 2 years ago she started having recurrent redness and swelling in her eyes.  She is also had blurred vision with cloudy cornea per patient.  She has had scratches in her eyes.  She has extremely dry eyes.  These episodes will happen every 2 to 3 months and sometimes every other month.  She was initially evaluated by her PCP and then was referred to Dr. Katy Fitch.  She has had prednisone eyedrops frequently.  Patient states that she has been using Restasis eyedrops.  She states she sleeps with her eyes open and that makes her eyes very dry.  She also complains of dry mouth.  She states she gets frequent herpes infection in her nose which spreads to her lips and her chin.  Which is usually treated by acyclovir.  She was also diagnosed with eosinophilic esophagitis about 2 years ago.  She states she is under care of gastroenterologist in [place] and has been getting oral prednisone.  She was diagnosed with gastroparesis at the same time and she is participating in a study in [place].  She gives history of reflux since she was 43 years old.  She complains of Raynaud's for the last 5 years.  She is also had pain in multiple joints which she relates to playing sports.  She states she has had cortisone injection in her bilateral shoulders by Dr. Louanne Skye and he is also done meniscal tear repair on her knee joints in the past.  She developed carpal tunnel syndrome but could not get surgery for that as she was going through abdominal hernia repair.  She has had recurrent costochondritis for the last  few years as well.  Activities of Daily Living:  Patient reports morning stiffness for 1 hour.   Patient Reports nocturnal pain.  Difficulty dressing/grooming: Denies Difficulty climbing stairs: Reports Difficulty getting out of chair: Reports Difficulty using hands for taps, buttons, cutlery, and/or writing: Reports  Review of Systems  Constitutional: Positive for fatigue. Negative for night sweats, weight gain and weight loss.  HENT: Positive for mouth dryness. Negative for mouth sores, trouble swallowing, trouble swallowing and nose dryness.   Eyes: Positive for dryness. Negative for pain, redness and visual disturbance.  Respiratory: Negative for cough, shortness of breath and difficulty breathing.   Cardiovascular: Negative for chest pain, palpitations, hypertension, irregular heartbeat and swelling in legs/feet.  Gastrointestinal: Positive for heartburn. Negative for blood in stool, constipation and diarrhea.  Endocrine: Negative for increased urination.  Genitourinary: Negative for vaginal dryness.  Musculoskeletal: Positive for arthralgias, joint pain and morning stiffness. Negative for joint swelling, myalgias, muscle weakness, muscle tenderness and myalgias.  Skin: Positive for color change, hair loss, skin tightness and sensitivity to sunlight. Negative for rash and ulcers.  Allergic/Immunologic: Negative for susceptible to infections.  Neurological: Negative for dizziness, memory loss, night sweats and weakness.  Hematological: Negative for swollen glands.  Psychiatric/Behavioral: Negative for depressed mood and sleep disturbance. The patient is  nervous/anxious.     PMFS History:  Patient Active Problem List   Diagnosis Date Noted   Colitis 08/14/2019   Constipation    Gastroparesis 08/07/2019   Nausea and vomiting 04/26/2017   Nausea & vomiting 04/25/2017   Chest pain 04/25/2017   ANKLE, PAIN 11/24/2010   ANXIETY 10/11/2010   DVT 10/11/2010    COSTOCHONDRITIS 03/03/2008   CELLULITIS, GREAT TOE 12/01/2007   SINUSITIS, ACUTE NOS 09/25/2007   MENOPAUSE, PREMATURE 07/20/2007   UTERINE CANCER, HX OF 07/16/2007   Other postprocedural status(V45.89) 07/16/2007    Past Medical History:  Diagnosis Date   Complication of anesthesia    " i TAKE A LIITLE BIT LONGER TO [place] UP "   DVT (deep venous thrombosis) ([place])    x2   Gastroparesis 08/07/2019   Lymphedema    Uterine cancer ([place])     Family History  Problem Relation Age of Onset   Hypertension Other    Hyperlipidemia Other    Colon cancer Other        grandmother   Hashimoto's thyroiditis Mother    Throat cancer Father    Arrhythmia Father    Past Surgical History:  Procedure Laterality Date   APPENDECTOMY     CHOLECYSTECTOMY     ESOPHAGOGASTRODUODENOSCOPY N/A 04/30/2017   Procedure: ESOPHAGOGASTRODUODENOSCOPY (EGD);  Surgeon: Teena Irani, MD;  Location: [facility] ENDOSCOPY;  Service: Endoscopy;  Laterality: N/A;   HERNIA REPAIR     x2   KNEE SURGERY     x3   MINIMALLY INVASIVE FORAMINOTOMY CERVICAL SPINE     C6-T1, Nitka   SAVORY DILATION N/A 04/30/2017   Procedure: SAVORY DILATION;  Surgeon: Teena Irani, MD;  Location: [facility];  Service: Endoscopy;  Laterality: N/A;   TONSILLECTOMY     TOTAL ABDOMINAL HYSTERECTOMY     Sarcoma, s/p XRT   Social History   Social History Narrative   Not on file    There is no immunization history on file for this patient.   Objective: Vital Signs: BP 127/82 (BP Location: Right Arm, Patient Position: Sitting, Cuff Size: Normal)    Pulse 65    Resp 15    Ht 5\' 4"  (1.626 m)    Wt 203 lb (92.1 kg)    BMI 34.84 kg/m    Physical Exam Vitals signs and nursing note reviewed.  Constitutional:      Appearance: She is well-developed.  HENT:     Head: Normocephalic and atraumatic.  Eyes:     Conjunctiva/sclera: Conjunctivae normal.  Neck:     Musculoskeletal: Normal range of motion.  Cardiovascular:      Rate and Rhythm: Normal rate and regular rhythm.     Heart sounds: Normal heart sounds.  Pulmonary:     Effort: Pulmonary effort is normal.     Breath sounds: Normal breath sounds.  Abdominal:     General: Bowel sounds are normal.     Palpations: Abdomen is soft.  Lymphadenopathy:     Cervical: No cervical adenopathy.  Skin:    General: Skin is warm and dry.     Capillary Refill: Capillary refill takes less than 2 seconds.     Comments: No nailbed capillary changes were noted.  Neurological:     Mental Status: She is alert and oriented to person, place, and time.  Psychiatric:        Behavior: Behavior normal.      Musculoskeletal Exam: Patient had good range of motion of her cervical spine.  She had discomfort range of motion of shoulder joints.  Elbow joints wrist joint MCPs PIPs DIPs with good range of motion with no synovitis.  Hip joints, knee joints, ankles MTPs PIPs with good range of motion with no synovitis.  She has hyperalgesia and positive tender points.  CDAI Exam: CDAI Score: -- Patient Global: --; Provider Global: -- Swollen: --; Tender: -- Joint Exam   No joint exam has been documented for this visit   There is currently no information documented on the homunculus. Go to the Rheumatology activity and complete the homunculus joint exam.  Investigation: No additional findings.  Imaging: No results found.  Recent Labs: Lab Results  Component Value Date   WBC 8.4 08/13/2019   HGB 12.4 08/13/2019   PLT 268 08/13/2019   NA 138 08/15/2019   K 3.7 08/15/2019   CL 109 08/15/2019   CO2 20 (L) 08/15/2019   GLUCOSE 124 (H) 08/15/2019   BUN <5 (L) 08/15/2019   CREATININE 0.97 08/15/2019   BILITOT 0.7 08/06/2019   ALKPHOS 87 08/06/2019   AST 16 08/06/2019   ALT 14 08/06/2019   PROT 7.5 08/06/2019   ALBUMIN 4.2 08/06/2019   CALCIUM 9.1 08/15/2019   GFRAA >60 08/15/2019    Speciality Comments: No specialty comments available.  Procedures:  No  procedures performed Allergies: Other, Sulfa antibiotics, Sulfonamide derivatives, and Benadryl [diphenhydramine hcl]   Assessment / Plan:     Visit Diagnoses: Keratoconjunctivitis sicca ([place]) -patient was referred by Dr. Katy Fitch for recurrent conjunctivitis and keratoconjunctivitis.  Patient states that her eyes are very dry and she sleeps with her eyes open.  She gets frequent corneal scratches.  She has been using Restasis.  I will obtain AVISE labs today.  Carpal tunnel syndrome, unspecified laterality-patient has symptoms of carpal tunnel syndrome and has not had surgery yet.  Eosinophilic esophagitis-she is followed at gastroenterology and takes oral prednisone per patient.  She also gets longstanding history of reflux.  Gastroparesis- she has history of gastroparesis for which she has been followed and has study.  Raynaud's syndrome without gangrene-she gives history of Raynolds especially during wintertime.  She had good capillary refill and no nailbed changes on the examination today.  Chronic pain of both shoulders-related to sports injuries per patient.  She states she gets frequent cortisone injections.  Chronic pain of both knees-patient gives history of meniscal tear repair in bilateral knee joints in the past.  Costochondritis-she gives history of frequent episodes of costochondritis.  DDD cervical spine-patient had foraminotomy by Dr. Louanne Skye in the past.  She is chronic discomfort in her cervical spine.  Myofascial pain-she has generalized pain and discomfort.  She has several positive tender points.  History of uterine cancer  History of Clostridioides difficile colitis  History of shingles  History of anxiety  Orders: No orders of the defined types were placed in this encounter.  No orders of the defined types were placed in this encounter.   Face-to-face time spent with patient was 50 minutes. Greater than 50% of time was spent in counseling and coordination of  care.  Follow-Up Instructions: Return for Sicca symptoms, Raynaud's.   Bo Merino, MD  Note - This record has been created using Editor, commissioning.  Chart creation errors have been sought, but may not always  have been located. Such creation errors do not reflect on  the standard of medical care.

## 2019-10-02 ENCOUNTER — Other Ambulatory Visit: Payer: Self-pay

## 2019-10-02 ENCOUNTER — Ambulatory Visit (INDEPENDENT_AMBULATORY_CARE_PROVIDER_SITE_OTHER): Payer: Managed Care, Other (non HMO) | Admitting: Rheumatology

## 2019-10-02 ENCOUNTER — Encounter: Payer: Self-pay | Admitting: Rheumatology

## 2019-10-02 VITALS — BP 127/82 | HR 65 | Resp 15 | Ht 64.0 in | Wt 203.0 lb

## 2019-10-02 DIAGNOSIS — G5600 Carpal tunnel syndrome, unspecified upper limb: Secondary | ICD-10-CM

## 2019-10-02 DIAGNOSIS — G8929 Other chronic pain: Secondary | ICD-10-CM

## 2019-10-02 DIAGNOSIS — M25512 Pain in left shoulder: Secondary | ICD-10-CM

## 2019-10-02 DIAGNOSIS — Z8659 Personal history of other mental and behavioral disorders: Secondary | ICD-10-CM

## 2019-10-02 DIAGNOSIS — M5030 Other cervical disc degeneration, unspecified cervical region: Secondary | ICD-10-CM

## 2019-10-02 DIAGNOSIS — Z8542 Personal history of malignant neoplasm of other parts of uterus: Secondary | ICD-10-CM

## 2019-10-02 DIAGNOSIS — I7300 Raynaud's syndrome without gangrene: Secondary | ICD-10-CM

## 2019-10-02 DIAGNOSIS — M25511 Pain in right shoulder: Secondary | ICD-10-CM

## 2019-10-02 DIAGNOSIS — Z8619 Personal history of other infectious and parasitic diseases: Secondary | ICD-10-CM

## 2019-10-02 DIAGNOSIS — K3184 Gastroparesis: Secondary | ICD-10-CM

## 2019-10-02 DIAGNOSIS — M3501 Sicca syndrome with keratoconjunctivitis: Secondary | ICD-10-CM | POA: Diagnosis not present

## 2019-10-02 DIAGNOSIS — M25562 Pain in left knee: Secondary | ICD-10-CM

## 2019-10-02 DIAGNOSIS — K200 Eosinophilic esophagitis: Secondary | ICD-10-CM

## 2019-10-02 DIAGNOSIS — M7918 Myalgia, other site: Secondary | ICD-10-CM

## 2019-10-02 DIAGNOSIS — M940 Chondrocostal junction syndrome [Tietze]: Secondary | ICD-10-CM

## 2019-10-02 DIAGNOSIS — M25561 Pain in right knee: Secondary | ICD-10-CM

## 2019-10-09 NOTE — Progress Notes (Signed)
Office Visit Note  Patient: [name] Horn             Date of Birth: 1976/11/20           MRN: [id number]             PCP: Burnadette Peter, MD Referring: Burnadette Peter, MD Visit Date: 10/23/2019 Occupation: @GUAROCC @  Subjective:  Sicca symptoms   History of Present Illness: [name] Horn is a 43 y.o. female with history of sicca symptoms, Raynaud's, and myofascial pain.  She presents today to discuss AVISE labs.  She continues to have severe sicca symptoms.  She has tried using Biotene products for mouth dryness but has not noticed much improvement.  She states she continues to have recurrent dental caries.  She had to dental fillings yesterday.  She states that 2 days ago she started having left eye pain, redness, and severe dryness.  She is having some visual disturbances due to discomfort.  She is using steroid eyedrops as well as an antihistamine drop.  She has not seen Dr. Katy Fitch since the left eye started causing similar symptoms as the right eye did in the past.  She is using Systane gel drops, Restasis, and a nighttime ointment.  She continues to have intermittent symptoms of Raynaud's. She continues to have pain in multiple joints including bilateral hands, bilateral knee joints, bilateral feet.  She denies any joint swelling currently.  She continues to have symptoms of right carpal tunnel syndrome and wears a daytime and nighttime splint on a regular basis.  She is not ready to proceed with surgery at this time.  She has chronic neck pain and stiffness as well.  She denies any symptoms of radiculopathy.  She has generalized muscle aches muscle tenderness due to myofascial pain syndrome.  She has chronic fatigue related to insomnia.   Activities of Daily Living:  Patient reports morning stiffness for 30-60 minutes.   Patient Reports nocturnal pain.  Difficulty dressing/grooming: Denies Difficulty climbing stairs: Denies Difficulty getting out of chair: Denies  Difficulty using hands for taps, buttons, cutlery, and/or writing: Reports  Review of Systems  Constitutional: Positive for fatigue.  HENT: Positive for mouth dryness. Negative for mouth sores and nose dryness.   Eyes: Positive for pain, redness and dryness.  Respiratory: Negative for cough, hemoptysis, shortness of breath and difficulty breathing.   Cardiovascular: Negative for chest pain, palpitations, hypertension and swelling in legs/feet.  Gastrointestinal: Negative for blood in stool, constipation and diarrhea.  Endocrine: Negative for increased urination.  Genitourinary: Negative for painful urination.  Musculoskeletal: Positive for arthralgias, joint pain, joint swelling and morning stiffness. Negative for myalgias, muscle weakness, muscle tenderness and myalgias.  Skin: Positive for color change. Negative for pallor, rash, hair loss, nodules/bumps, skin tightness, ulcers and sensitivity to sunlight.  Allergic/Immunologic: Negative for susceptible to infections.  Neurological: Positive for headaches. Negative for dizziness, light-headedness, memory loss and weakness.  Hematological: Negative for swollen glands.  Psychiatric/Behavioral: Positive for sleep disturbance. Negative for depressed mood and confusion. The patient is not nervous/anxious.     PMFS History:  Patient Active Problem List   Diagnosis Date Noted  . Colitis 08/14/2019  . Constipation   . Gastroparesis 08/07/2019  . Nausea and vomiting 04/26/2017  . Nausea & vomiting 04/25/2017  . Chest pain 04/25/2017  . ANKLE, PAIN 11/24/2010  . ANXIETY 10/11/2010  . DVT 10/11/2010  . COSTOCHONDRITIS 03/03/2008  . CELLULITIS, GREAT TOE 12/01/2007  . SINUSITIS, ACUTE NOS 09/25/2007  .  MENOPAUSE, PREMATURE 07/20/2007  . UTERINE CANCER, HX OF 07/16/2007  . Other postprocedural status(V45.89) 07/16/2007    Past Medical History:  Diagnosis Date  . Complication of anesthesia    " i TAKE A LIITLE BIT LONGER TO [place] UP "   . DVT (deep venous thrombosis) ([place])    x2  . Gastroparesis 08/07/2019  . Lymphedema   . Uterine cancer ([place])     Family History  Problem Relation Age of Onset  . Hypertension Other   . Hyperlipidemia Other   . Colon cancer Other        grandmother  . Hashimoto's thyroiditis Mother   . Throat cancer Father   . Arrhythmia Father    Past Surgical History:  Procedure Laterality Date  . APPENDECTOMY    . CHOLECYSTECTOMY    . ESOPHAGOGASTRODUODENOSCOPY N/A 04/30/2017   Procedure: ESOPHAGOGASTRODUODENOSCOPY (EGD);  Surgeon: Teena Irani, MD;  Location: [facility] ENDOSCOPY;  Service: Endoscopy;  Laterality: N/A;  . HERNIA REPAIR     x2  . KNEE SURGERY     x3  . MINIMALLY INVASIVE FORAMINOTOMY CERVICAL SPINE     C6-T1, Nitka  . SAVORY DILATION N/A 04/30/2017   Procedure: SAVORY DILATION;  Surgeon: Teena Irani, MD;  Location[facility] ENDOSCOPY;  Service: Endoscopy;  Laterality: N/A;  . TONSILLECTOMY    . TOTAL ABDOMINAL HYSTERECTOMY     Sarcoma, s/p XRT   Social History   Social History Narrative  . Not on file    There is no immunization history on file for this patient.   Objective: Vital Signs: BP 100/67 (BP Location: Left Arm, Patient Position: Sitting, Cuff Size: Normal)   Pulse 79   Resp 14   Ht 5\' 4"  (1.626 m)   Wt 198 lb (89.8 kg)   BMI 33.99 kg/m    Physical Exam Vitals signs and nursing note reviewed.  Constitutional:      Appearance: She is well-developed.  HENT:     Head: Normocephalic and atraumatic.  Eyes:     Conjunctiva/sclera: Conjunctivae normal.     Comments: Left eye conjunctival injection   Neck:     Musculoskeletal: Normal range of motion.  Cardiovascular:     Rate and Rhythm: Normal rate and regular rhythm.     Heart sounds: Normal heart sounds.  Pulmonary:     Effort: Pulmonary effort is normal.     Breath sounds: Normal breath sounds.  Abdominal:     General: Bowel sounds are normal.     Palpations: Abdomen is soft.  Lymphadenopathy:      Cervical: No cervical adenopathy.  Skin:    General: Skin is warm and dry.     Capillary Refill: Capillary refill takes less than 2 seconds.  Neurological:     Mental Status: She is alert and oriented to person, place, and time.  Psychiatric:        Behavior: Behavior normal.      Musculoskeletal Exam: C-spine, thoracic spine, and lumbar spine good ROM.  No midline spinal tenderness.  No SI joint tenderness.  Shoulder joints, elbow joints, wrist joints, MCPs, PIPs, and DIPs good ROM with no synovitis. Tenderness of MCPs and bilateral wrist joints but no synovitis noted. Complete fist formation bilaterally.  Hip joints, knee joints, ankle joints, MTPs, PIPs, and DIPs good ROM with no synovitis.  No warmth or effusion of knee joints.  No tenderness or swelling of ankle joints.   CDAI Exam: CDAI Score: - Patient Global: -; Provider Global: -  Swollen: -; Tender: - Joint Exam   No joint exam has been documented for this visit   There is currently no information documented on the homunculus. Go to the Rheumatology activity and complete the homunculus joint exam.  Investigation: No additional findings.  Imaging: No results found.  Recent Labs: Lab Results  Component Value Date   WBC 8.4 08/13/2019   HGB 12.4 08/13/2019   PLT 268 08/13/2019   NA 138 08/15/2019   K 3.7 08/15/2019   CL 109 08/15/2019   CO2 20 (L) 08/15/2019   GLUCOSE 124 (H) 08/15/2019   BUN <5 (L) 08/15/2019   CREATININE 0.97 08/15/2019   BILITOT 0.7 08/06/2019   ALKPHOS 87 08/06/2019   AST 16 08/06/2019   ALT 14 08/06/2019   PROT 7.5 08/06/2019   ALBUMIN 4.2 08/06/2019   CALCIUM 9.1 08/15/2019   GFRAA >60 08/15/2019  October 11/2019 AVISE index -2.4, ANA negative, ENA negative, CCP Negative, Jo 1 -, anticardiolipin negative, beta-2 negative, RF IgM negative, RF IgA 39, anti-CCP negative, antithyroglobulin negative, anti-TPO negative, anti-MPO negative, anti-PR-3 negative, anti-GBM negative  Speciality  Comments: No specialty comments available.  Procedures:  No procedures performed Allergies: Other, Sulfa antibiotics, Sulfonamide derivatives, and Benadryl [diphenhydramine hcl]   Assessment / Plan:     Visit Diagnoses: Keratoconjunctivitis sicca ([place]) - She was referred by Dr. Katy Fitch for recurrent conjunctivitis and keratoconjunctivitis.  AVISE index -2.4, all labs were negative except RF IgA 39.  Lab work was reviewed with the patient today.  All questions were addressed.  She continues to have chronic sicca symptoms.  She is having left eye conjunctival injection, pain, and dryness, which started 2 days ago.  She has been using Restasis and Systane eyedrops.  She is also been using prednisolone acetate drops as well.  She was advised to follow-up with Dr. Carolynn Sayers if her symptoms persist or worsen.  She continues to have mouth dryness and has tried Biotene products without much improvement.  She has had recurrent dental caries and had 2 tooth fillings yesterday.  She continues to have pain in multiple joints including bilateral hands, bilateral knees, bilateral feet.  No joint synovitis was noted on exam today.  Recent lab work revealed RF IgA 39 which raises a suspicion for rheumatoid arthritis.  We will schedule an ultrasound of both hands to assess for synovitis.  We will also reach out to Dr. Warden Fillers to discuss possibly starting her on Methotrexate.    Dry mouth: She has chronic mouth dryness.  She has tried using Biotene products over-the-counter, but she has not noticed much improvement.  She has recurrent dental caries and had 2 teeth fillings yesterday.  We discussed increasing her fluid intake.  High risk medication use -The following baseline immunosuppressive labs were obtained today in case we need to start her on methotrexate.  Plan: COMPLETE METABOLIC PANEL WITH GFR, CBC with Differential/Platelet, HIV Antibody (routine testing w rflx), QuantiFERON-TB Gold Plus, Serum protein  electrophoresis with reflex, IgG, IgA, IgM, Hepatitis B core antibody, IgM, Hepatitis B surface antigen, Hepatitis C antibody  Pain in both hands - She continues to have pain in both hands. She has tenderness of all MCPs and bilateral wrist joints but no synovitis was noted on exam today.  RF IgA was 39 on 10/07/2019.  X-rays of both hands were obtained today which were unremarkable.  We will schedule ultrasound of both hands to assess for synovitis.  Plan: XR Hand 2 View Right, XR Hand 2 View  Left  Pain in both feet -She has chronic pain in both feet.  No joint swelling noted. She has mild osteoarthritic changes in both feet.  Plan: XR Foot 2 Views Right, XR Foot 2 Views Left  Rheumatoid factor positive: Rheumatoid factor IgA 39 on 10/07/19.  She has no synovitis on exam today.  She has pain in multiple joints including bilateral hands bone bilateral knee joints, bilateral feet.  No warmth or effusion of knee joints were noted on exam.  We will schedule ultrasound of both hands to assess for synovitis.  Eosinophilic esophagitis - She has taken oral prednisone per gastroenterology in the past.  Carpal tunnel syndrome of right wrist: She continues to be symptomatic. She wears a daytime and nighttime splint on a regular basis.  She is not ready to proceed with surgery at this time.   Raynaud's syndrome without gangrene - She has been experiencing symptoms of Raynaud's with cooler temperatures recently. Capillary refill <2 seconds.  No digital ulcerations or signs of gangrene. She was encouraged to keep her core body temperature warm and wear gloves/socks.   Chronic pain of both shoulders - Related to sports injuries: She has good ROM of both shoulder joints.  She has no tenderness or inflammation on exam.    Chronic pain of both knees - History of meniscal tear repair in the past.  She has had arthroscopic surgery on both knee joints according to the patient.  She has chronic pain in both knee joints.   No warmth or effusion of knee joints noted on exam.  DDD (degenerative disc disease), cervical - Patient had foraminotomy by Dr. Louanne Skye in the past.  She has good ROM with no discomfort at this time.  She experiences discomfort and stiffness in her neck intermittently. She has no symptoms of radiculopathy.   Myofascial pain:   Other medical conditions are listed as follows:   History of anxiety  Gastroparesis  UTERINE CANCER, HX OF  History of shingles  History of Clostridioides difficile colitis   Orders: Orders Placed This Encounter  Procedures  . XR Hand 2 View Right  . XR Hand 2 View Left  . XR Foot 2 Views Right  . XR Foot 2 Views Left  . COMPLETE METABOLIC PANEL WITH GFR  . CBC with Differential/Platelet  . HIV Antibody (routine testing w rflx)  . QuantiFERON-TB Gold Plus  . Serum protein electrophoresis with reflex  . IgG, IgA, IgM  . Hepatitis B core antibody, IgM  . Hepatitis B surface antigen  . Hepatitis C antibody   No orders of the defined types were placed in this encounter.   Face-to-face time spent with patient was 30 minutes. Greater than 50% of time was spent in counseling and coordination of care.  Follow-Up Instructions: Return in about 4 weeks (around 11/20/2019) for Sicca symptoms , DDD.   Ofilia Neas, PA-C   I examined and evaluated the patient with Hazel Sams PA.  Patient continues to have recurrent eye inflammation.  Her RF IgA is positive.  She has generalized hyperalgesia although she had tenderness over her wrist joints and hands as well.  The x-rays today were unremarkable.  I will schedule ultrasound of her hands.  I spoke with Dr. Warden Fillers this afternoon.  He explained to me that patient has very unusual autoimmune conjunctivitis.  Based on her positive rheumatoid factor he believes that she would benefit from a trial of methotrexate.  He has been using recurrent steroids and  steroid eyedrops to control her symptoms.  I plan to  start her on methotrexate at the follow-up visit..  The plan of care was discussed as noted above.  Bo Merino, MD  Note - This record has been created using Editor, commissioning.  Chart creation errors have been sought, but may not always  have been located. Such creation errors do not reflect on  the standard of medical care.

## 2019-10-23 ENCOUNTER — Ambulatory Visit (INDEPENDENT_AMBULATORY_CARE_PROVIDER_SITE_OTHER): Payer: Managed Care, Other (non HMO)

## 2019-10-23 ENCOUNTER — Other Ambulatory Visit: Payer: Self-pay

## 2019-10-23 ENCOUNTER — Ambulatory Visit (INDEPENDENT_AMBULATORY_CARE_PROVIDER_SITE_OTHER): Payer: Managed Care, Other (non HMO) | Admitting: Rheumatology

## 2019-10-23 ENCOUNTER — Ambulatory Visit: Payer: Self-pay

## 2019-10-23 ENCOUNTER — Encounter: Payer: Self-pay | Admitting: Rheumatology

## 2019-10-23 VITALS — BP 100/67 | HR 79 | Resp 14 | Ht 64.0 in | Wt 198.0 lb

## 2019-10-23 DIAGNOSIS — M79672 Pain in left foot: Secondary | ICD-10-CM

## 2019-10-23 DIAGNOSIS — M25561 Pain in right knee: Secondary | ICD-10-CM

## 2019-10-23 DIAGNOSIS — Z8619 Personal history of other infectious and parasitic diseases: Secondary | ICD-10-CM

## 2019-10-23 DIAGNOSIS — Z79899 Other long term (current) drug therapy: Secondary | ICD-10-CM

## 2019-10-23 DIAGNOSIS — M79642 Pain in left hand: Secondary | ICD-10-CM

## 2019-10-23 DIAGNOSIS — M3501 Sicca syndrome with keratoconjunctivitis: Secondary | ICD-10-CM

## 2019-10-23 DIAGNOSIS — M79671 Pain in right foot: Secondary | ICD-10-CM | POA: Diagnosis not present

## 2019-10-23 DIAGNOSIS — M19071 Primary osteoarthritis, right ankle and foot: Secondary | ICD-10-CM

## 2019-10-23 DIAGNOSIS — M79641 Pain in right hand: Secondary | ICD-10-CM

## 2019-10-23 DIAGNOSIS — M5030 Other cervical disc degeneration, unspecified cervical region: Secondary | ICD-10-CM

## 2019-10-23 DIAGNOSIS — G5601 Carpal tunnel syndrome, right upper limb: Secondary | ICD-10-CM

## 2019-10-23 DIAGNOSIS — R768 Other specified abnormal immunological findings in serum: Secondary | ICD-10-CM | POA: Diagnosis not present

## 2019-10-23 DIAGNOSIS — K3184 Gastroparesis: Secondary | ICD-10-CM

## 2019-10-23 DIAGNOSIS — M19072 Primary osteoarthritis, left ankle and foot: Secondary | ICD-10-CM

## 2019-10-23 DIAGNOSIS — R682 Dry mouth, unspecified: Secondary | ICD-10-CM | POA: Diagnosis not present

## 2019-10-23 DIAGNOSIS — G8929 Other chronic pain: Secondary | ICD-10-CM

## 2019-10-23 DIAGNOSIS — M7918 Myalgia, other site: Secondary | ICD-10-CM

## 2019-10-23 DIAGNOSIS — K200 Eosinophilic esophagitis: Secondary | ICD-10-CM

## 2019-10-23 DIAGNOSIS — Z8542 Personal history of malignant neoplasm of other parts of uterus: Secondary | ICD-10-CM

## 2019-10-23 DIAGNOSIS — Z8659 Personal history of other mental and behavioral disorders: Secondary | ICD-10-CM

## 2019-10-23 DIAGNOSIS — M25511 Pain in right shoulder: Secondary | ICD-10-CM

## 2019-10-23 DIAGNOSIS — M25562 Pain in left knee: Secondary | ICD-10-CM

## 2019-10-23 DIAGNOSIS — M25512 Pain in left shoulder: Secondary | ICD-10-CM

## 2019-10-23 DIAGNOSIS — I7300 Raynaud's syndrome without gangrene: Secondary | ICD-10-CM

## 2019-10-28 LAB — COMPLETE METABOLIC PANEL WITH GFR
AG Ratio: 1.8 (calc) (ref 1.0–2.5)
ALT: 26 U/L (ref 6–29)
AST: 19 U/L (ref 10–35)
Albumin: 4.2 g/dL (ref 3.6–5.1)
Alkaline phosphatase (APISO): 74 U/L (ref 31–125)
BUN: 9 mg/dL (ref 7–25)
CO2: 28 mmol/L (ref 20–32)
Calcium: 9.5 mg/dL (ref 8.6–10.2)
Chloride: 106 mmol/L (ref 98–110)
Creat: 0.93 mg/dL (ref 0.50–1.10)
GFR, Est African American: 86 mL/min/{1.73_m2} (ref >=60)
GFR, Est Non African American: 74 mL/min/{1.73_m2} (ref >=60)
Globulin: 2.3 g/dL (calc) (ref 1.9–3.7)
Glucose, Bld: 102 mg/dL — ABNORMAL HIGH (ref 65–99)
Potassium: 4.6 mmol/L (ref 3.5–5.3)
Sodium: 142 mmol/L (ref 135–146)
Total Bilirubin: 0.3 mg/dL (ref 0.2–1.2)
Total Protein: 6.5 g/dL (ref 6.1–8.1)

## 2019-10-28 LAB — PROTEIN ELECTROPHORESIS, SERUM, WITH REFLEX
Albumin ELP: 4.2 g/dL (ref 3.8–4.8)
Alpha 1: 0.3 g/dL (ref 0.2–0.3)
Alpha 2: 0.7 g/dL (ref 0.5–0.9)
Beta 2: 0.3 g/dL (ref 0.2–0.5)
Beta Globulin: 0.4 g/dL (ref 0.4–0.6)
Gamma Globulin: 0.7 g/dL — ABNORMAL LOW (ref 0.8–1.7)
Total Protein: 6.6 g/dL (ref 6.1–8.1)

## 2019-10-28 LAB — QUANTIFERON-TB GOLD PLUS
Mitogen-NIL: >10 IU/mL
NIL: 0.03 IU/mL
QuantiFERON-TB Gold Plus: NEGATIVE
TB1-NIL: 0 IU/mL
TB2-NIL: 0 IU/mL

## 2019-10-28 LAB — CBC WITH DIFFERENTIAL/PLATELET
Absolute Monocytes: 505 cells/uL (ref 200–950)
Basophils Absolute: 52 cells/uL (ref 0–200)
Basophils Relative: 0.9 %
Eosinophils Absolute: 331 cells/uL (ref 15–500)
Eosinophils Relative: 5.7 %
HCT: 34.1 % — ABNORMAL LOW (ref 35.0–45.0)
Hemoglobin: 11.4 g/dL — ABNORMAL LOW (ref 11.7–15.5)
Lymphs Abs: 2279 cells/uL (ref 850–3900)
MCH: 30.7 pg (ref 27.0–33.0)
MCHC: 33.4 g/dL (ref 32.0–36.0)
MCV: 91.9 fL (ref 80.0–100.0)
MPV: 9.5 fL (ref 7.5–12.5)
Monocytes Relative: 8.7 %
Neutro Abs: 2633 cells/uL (ref 1500–7800)
Neutrophils Relative %: 45.4 %
Platelets: 320 10*3/uL (ref 140–400)
RBC: 3.71 10*6/uL — ABNORMAL LOW (ref 3.80–5.10)
RDW: 13.4 % (ref 11.0–15.0)
Total Lymphocyte: 39.3 %
WBC: 5.8 10*3/uL (ref 3.8–10.8)

## 2019-10-28 LAB — HEPATITIS C ANTIBODY
Hepatitis C Ab: NONREACTIVE
SIGNAL TO CUT-OFF: 0.01 (ref <1.00)

## 2019-10-28 LAB — IGG, IGA, IGM
IgG (Immunoglobin G), Serum: 773 mg/dL (ref 600–1640)
IgM, Serum: 131 mg/dL (ref 50–300)
Immunoglobulin A: 150 mg/dL (ref 47–310)

## 2019-10-28 LAB — HIV ANTIBODY (ROUTINE TESTING W REFLEX): HIV 1&2 Ab, 4th Generation: NONREACTIVE

## 2019-10-28 LAB — IFE INTERPRETATION: Immunofix Electr Int: NOT DETECTED

## 2019-10-28 LAB — HEPATITIS B SURFACE ANTIGEN: Hepatitis B Surface Ag: NONREACTIVE

## 2019-10-28 LAB — HEPATITIS B CORE ANTIBODY, IGM: Hep B C IgM: NONREACTIVE

## 2019-10-29 NOTE — Progress Notes (Signed)
Hgb and Hct are mildly low.  Please notify patient that she has mild anemia.  CMP WNL.   Immunoglobulins WNL.  HIV negative. Hep B negative. Hep C negative. TB gold negative.  IFE did not reveal any monoclonal proteins.

## 2019-10-30 ENCOUNTER — Ambulatory Visit: Payer: Self-pay

## 2019-10-30 ENCOUNTER — Ambulatory Visit (INDEPENDENT_AMBULATORY_CARE_PROVIDER_SITE_OTHER): Payer: Managed Care, Other (non HMO) | Admitting: Rheumatology

## 2019-10-30 ENCOUNTER — Other Ambulatory Visit: Payer: Self-pay

## 2019-10-30 ENCOUNTER — Encounter: Payer: Self-pay | Admitting: Rheumatology

## 2019-10-30 VITALS — BP 110/66 | HR 62 | Resp 16 | Ht 64.0 in | Wt 198.2 lb

## 2019-10-30 DIAGNOSIS — M7918 Myalgia, other site: Secondary | ICD-10-CM

## 2019-10-30 DIAGNOSIS — Z8659 Personal history of other mental and behavioral disorders: Secondary | ICD-10-CM

## 2019-10-30 DIAGNOSIS — I7300 Raynaud's syndrome without gangrene: Secondary | ICD-10-CM

## 2019-10-30 DIAGNOSIS — M19071 Primary osteoarthritis, right ankle and foot: Secondary | ICD-10-CM

## 2019-10-30 DIAGNOSIS — M0579 Rheumatoid arthritis with rheumatoid factor of multiple sites without organ or systems involvement: Secondary | ICD-10-CM

## 2019-10-30 DIAGNOSIS — M3501 Sicca syndrome with keratoconjunctivitis: Secondary | ICD-10-CM | POA: Diagnosis not present

## 2019-10-30 DIAGNOSIS — M19072 Primary osteoarthritis, left ankle and foot: Secondary | ICD-10-CM

## 2019-10-30 DIAGNOSIS — G5601 Carpal tunnel syndrome, right upper limb: Secondary | ICD-10-CM

## 2019-10-30 DIAGNOSIS — Z79899 Other long term (current) drug therapy: Secondary | ICD-10-CM | POA: Diagnosis not present

## 2019-10-30 DIAGNOSIS — M25511 Pain in right shoulder: Secondary | ICD-10-CM

## 2019-10-30 DIAGNOSIS — M25562 Pain in left knee: Secondary | ICD-10-CM

## 2019-10-30 DIAGNOSIS — M79642 Pain in left hand: Secondary | ICD-10-CM

## 2019-10-30 DIAGNOSIS — H16229 Keratoconjunctivitis sicca, not specified as Sjogren's, unspecified eye: Secondary | ICD-10-CM

## 2019-10-30 DIAGNOSIS — K3184 Gastroparesis: Secondary | ICD-10-CM

## 2019-10-30 DIAGNOSIS — G8929 Other chronic pain: Secondary | ICD-10-CM

## 2019-10-30 DIAGNOSIS — M25561 Pain in right knee: Secondary | ICD-10-CM

## 2019-10-30 DIAGNOSIS — M79641 Pain in right hand: Secondary | ICD-10-CM

## 2019-10-30 DIAGNOSIS — M5030 Other cervical disc degeneration, unspecified cervical region: Secondary | ICD-10-CM

## 2019-10-30 DIAGNOSIS — M25512 Pain in left shoulder: Secondary | ICD-10-CM

## 2019-10-30 DIAGNOSIS — Z8542 Personal history of malignant neoplasm of other parts of uterus: Secondary | ICD-10-CM

## 2019-10-30 DIAGNOSIS — K200 Eosinophilic esophagitis: Secondary | ICD-10-CM

## 2019-10-30 DIAGNOSIS — Z8619 Personal history of other infectious and parasitic diseases: Secondary | ICD-10-CM

## 2019-10-30 MED ORDER — METHOTREXATE SODIUM CHEMO INJECTION 50 MG/2ML: INTRAMUSCULAR | 0 refills | Status: AC

## 2019-10-30 MED ORDER — FOLIC ACID 1 MG PO TABS: 2.0000 mg | ORAL_TABLET | Freq: Every day | ORAL | 3 refills | Status: AC

## 2019-10-30 MED ORDER — "TUBERCULIN SYRINGE 27G X 1/2"" 1 ML MISC": 12.0000 | 3 refills | Status: DC

## 2019-10-30 NOTE — Patient Instructions (Addendum)
Standing Labs We placed an order today for your standing lab work.    Please come back and get your standing labs in 2 weeks x2, 2 months, then every 3 months   We have open lab daily Monday through Thursday from 8:30-12:30 PM and 1:30-4:30 PM and Friday from 8:30-12:30 PM and 1:30-4:00 PM at the office of Dr. Bo Merino.   You may experience shorter wait times on Monday and Friday afternoons. The office is located at [address] [place], [place], [place] [address] No appointment is necessary.   Labs are drawn by [facility].  You may receive a bill from [facility] for your lab work.  If you wish to have your labs drawn at another location, please call the office 24 hours in advance to send orders.  If you have any questions regarding directions or hours of operation,  please call [phone].   Just as a reminder please drink plenty of water prior to coming for your lab work. Thanks!  Vaccines You are taking a medication(s) that can suppress your immune system.  The following immunizations are recommended: . Flu annually . Pneumonia (Pneumovax 23 and Prevnar 13 spaced at least 1 year apart) . Shingrix  Please check with your PCP to make sure you are up to date.  Methotrexate tablets What is this medicine? METHOTREXATE (METH oh TREX ate) is a chemotherapy drug used to treat cancer including breast cancer, leukemia, and lymphoma. This medicine can also be used to treat psoriasis and certain kinds of arthritis. This medicine may be used for other purposes; ask your health care provider or pharmacist if you have questions. COMMON BRAND NAME(S): Rheumatrex, Trexall What should I tell my health care provider before I take this medicine? They need to know if you have any of these conditions:  fluid in the stomach area or lungs  if you often drink alcohol  infection or immune system problems  kidney disease or on hemodialysis  liver disease  low blood counts, like low white  cell, platelet, or red cell counts  lung disease  radiation therapy  stomach ulcers  ulcerative colitis  an unusual or allergic reaction to methotrexate, other medicines, foods, dyes, or preservatives  pregnant or trying to get pregnant  breast-feeding How should I use this medicine? Take this medicine by mouth with a glass of water. Follow the directions on the prescription label. Take your medicine at regular intervals. Do not take it more often than directed. Do not stop taking except on your doctor's advice. Make sure you know why you are taking this medicine and how often you should take it. If this medicine is used for a condition that is not cancer, like arthritis or psoriasis, it should be taken weekly, NOT daily. Taking this medicine more often than directed can cause serious side effects, even death. Talk to your healthcare provider about safe handling and disposal of this medicine. You may need to take special precautions. Talk to your pediatrician regarding the use of this medicine in children. While this drug may be prescribed for selected conditions, precautions do apply. Overdosage: If you think you have taken too much of this medicine contact a poison control center or emergency room at once. NOTE: This medicine is only for you. Do not share this medicine with others. What if I miss a dose? If you miss a dose, talk with your doctor or health care professional. Do not take double or extra doses. What may interact with this medicine?  This medicine may interact with the following medication:  acitretin  aspirin and aspirin-like medicines including salicylates  azathioprine  certain antibiotics like penicillins, tetracycline, and chloramphenicol  cyclosporine  gold  hydroxychloroquine  live virus vaccines  NSAIDs, medicines for pain and inflammation, like ibuprofen or naproxen  other cytotoxic  agents  penicillamine  phenylbutazone  phenytoin  probenecid  retinoids such as isotretinoin and tretinoin  steroid medicines like prednisone or cortisone  sulfonamides like sulfasalazine and trimethoprim/sulfamethoxazole  theophylline This list may not describe all possible interactions. Give your health care provider a list of all the medicines, herbs, non-prescription drugs, or dietary supplements you use. Also tell them if you smoke, drink alcohol, or use illegal drugs. Some items may interact with your medicine. What should I watch for while using this medicine? Avoid alcoholic drinks. This medicine can make you more sensitive to the sun. Keep out of the sun. If you cannot avoid being in the sun, wear protective clothing and use sunscreen. Do not use sun lamps or tanning beds/booths. You may need blood work done while you are taking this medicine. Call your doctor or health care professional for advice if you get a fever, chills or sore throat, or other symptoms of a cold or flu. Do not treat yourself. This drug decreases your body's ability to fight infections. Try to avoid being around people who are sick. This medicine may increase your risk to bruise or bleed. Call your doctor or health care professional if you notice any unusual bleeding. Check with your doctor or health care professional if you get an attack of severe diarrhea, nausea and vomiting, or if you sweat a lot. The loss of too much body fluid can make it dangerous for you to take this medicine. Talk to your doctor about your risk of cancer. You may be more at risk for certain types of cancers if you take this medicine. Both men and women must use effective birth control with this medicine. Do not become pregnant while taking this medicine or until at least 1 normal menstrual cycle has occurred after stopping it. Women should inform their doctor if they wish to become pregnant or think they might be pregnant. Men should  not father a child while taking this medicine and for 3 months after stopping it. There is a potential for serious side effects to an unborn child. Talk to your health care professional or pharmacist for more information. Do not breast-feed an infant while taking this medicine. What side effects may I notice from receiving this medicine? Side effects that you should report to your doctor or health care professional as soon as possible:  allergic reactions like skin rash, itching or hives, swelling of the face, lips, or tongue  breathing problems or shortness of breath  diarrhea  dry, nonproductive cough  low blood counts - this medicine may decrease the number of white blood cells, red blood cells and platelets. You may be at increased risk for infections and bleeding.  mouth sores  redness, blistering, peeling or loosening of the skin, including inside the mouth  signs of infection - fever or chills, cough, sore throat, pain or trouble passing urine  signs and symptoms of bleeding such as bloody or black, tarry stools; red or dark-brown urine; spitting up blood or brown material that looks like coffee grounds; red spots on the skin; unusual bruising or bleeding from the eye, gums, or nose  signs and symptoms of kidney injury like trouble passing urine  or change in the amount of urine  signs and symptoms of liver injury like dark yellow or brown urine; general ill feeling or flu-like symptoms; light-colored stools; loss of appetite; nausea; right upper belly pain; unusually weak or tired; yellowing of the eyes or skin Side effects that usually do not require medical attention (report to your doctor or health care professional if they continue or are bothersome):  dizziness  hair loss  tiredness  upset stomach  vomiting This list may not describe all possible side effects. Call your doctor for medical advice about side effects. You may report side effects to FDA at  1-800-FDA-1088. Where should I keep my medicine? Keep out of the reach of children. Store at room temperature between 20 and 25 degrees C (68 and 77 degrees F). Protect from light. Throw away any unused medicine after the expiration date. NOTE: This sheet is a summary. It may not cover all possible information. If you have questions about this medicine, talk to your doctor, pharmacist, or health care provider.  2020 Elsevier/Gold Standard (2017-08-03 13:38:43)

## 2019-10-30 NOTE — Progress Notes (Signed)
Pharmacy Note  Subjective: Patient presents today to the [facility] to see Dr. Estanislado Pandy.  Patient seen by the pharmacist for counseling on methotrexate for rheumatoid arthritis. She is naive to treatment.  Objective: CBC    Component Value Date/Time   WBC 5.8 10/23/2019 1151   RBC 3.71 (L) 10/23/2019 1151   HGB 11.4 (L) 10/23/2019 1151   HCT 34.1 (L) 10/23/2019 1151   PLT 320 10/23/2019 1151   MCV 91.9 10/23/2019 1151   MCH 30.7 10/23/2019 1151   MCHC 33.4 10/23/2019 1151   RDW 13.4 10/23/2019 1151   LYMPHSABS 2,279 10/23/2019 1151   MONOABS 1.2 (H) 08/10/2019 0814   EOSABS 331 10/23/2019 1151   BASOSABS 52 10/23/2019 1151    CMP     Component Value Date/Time   NA 142 10/23/2019 1151   K 4.6 10/23/2019 1151   CL 106 10/23/2019 1151   CO2 28 10/23/2019 1151   GLUCOSE 102 (H) 10/23/2019 1151   BUN 9 10/23/2019 1151   CREATININE 0.93 10/23/2019 1151   CALCIUM 9.5 10/23/2019 1151   PROT 6.5 10/23/2019 1151   PROT 6.6 10/23/2019 1151   ALBUMIN 4.2 08/06/2019 2037   AST 19 10/23/2019 1151   ALT 26 10/23/2019 1151   ALKPHOS 87 08/06/2019 2037   BILITOT 0.3 10/23/2019 1151   GFRNONAA 74 10/23/2019 1151   GFRAA 86 10/23/2019 1151    Baseline Immunosuppressant Therapy Labs TB GOLD Quantiferon TB Gold Latest Ref Rng & Units 10/23/2019  Quantiferon TB Gold Plus NEGATIVE NEGATIVE   Hepatitis Panel Hepatitis Latest Ref Rng & Units 10/23/2019  Hep B Surface Ag NON-REACTI NON-REACTIVE  Hep B IgM NON-REACTI NON-REACTIVE  Hep C Ab NON-REACTI NON-REACTIVE  Hep C Ab NON-REACTI NON-REACTIVE   HIV Lab Results  Component Value Date   HIV NON-REACTIVE 10/23/2019   HIV Non Reactive 08/07/2019   HIV Non Reactive 04/25/2017   HIV NON REACTIVE 10/27/2012   Immunoglobulins Immunoglobulin Electrophoresis Latest Ref Rng & Units 10/23/2019  IgA  47 - 310 mg/dL 150  IgG 600 - 1,640 mg/dL 773  IgM 50 - 300 mg/dL 131   SPEP Serum Protein Electrophoresis Latest Ref  Rng & Units 10/23/2019  Total Protein 6.1 - 8.1 g/dL 6.6  Albumin 3.8 - 4.8 g/dL 4.2  Alpha-1 0.2 - 0.3 g/dL 0.3  Alpha-2 0.5 - 0.9 g/dL 0.7  Beta Globulin 0.4 - 0.6 g/dL 0.4  Beta 2 0.2 - 0.5 g/dL 0.3  Gamma Globulin 0.8 - 1.7 g/dL 0.7(L)   G6PD No results found for: G6PDH TPMT No results found for: TPMT   Chest-xray:  No acute findnings 08/10/2019  Contraception: hysterectomy  Alcohol use: N/A  Assessment/Plan:   Patient was counseled on the purpose, proper use, and adverse effects of methotrexate including nausea, infection, and signs and symptoms of pneumonitis. Discussed that there is the possibility of an increased risk of malignancy, specifically lymphomas, but it is not well understood if this increased risk is due to the medication or the disease state.  Instructed patient that medication should be held for infection and prior to surgery.  Advised patient to avoid live vaccines. Recommend annual influenza, Pneumovax 23, Prevnar 13, and Shingrix as indicated.   Reviewed instructions with patient to take methotrexate weekly along with folic acid daily.  Discussed the importance of frequent monitoring of kidney and liver function and blood counts, and provided patient with standing lab instructions.  Counseled patient to avoid NSAIDs and alcohol while on methotrexate.  Provided patient with educational materials on methotrexate and answered all questions.   Patient voiced understanding.  Patient consented to methotrexate use.  Will upload into chart.    Dose of methotrexate will be 0.6 ml every 7 days for 2 weeks then increase to 0.8 mlevery 7 days as tolerated along with folic acid 2 mg daily.  Educated patient on how to use a vial and syringe and reviewed injection technique with patient.  Patient was able to demonstrate proper technique for injections using vial and syringe.  Provided patient educational material regarding injection technique and storage of methotrexate.    All  questions encouraged and answered.  Instructed patient to call with any questions or concerns.  Mariella Saa, PharmD, [place], [facility] Pharmacist [phone]  10/30/2019 3:29 PM

## 2019-10-30 NOTE — Progress Notes (Signed)
Office Visit Note  Patient: [name] Horn             Date of Birth: Jun 23, 1976           MRN: [id number]             PCP: Burnadette Peter, MD Referring: Burnadette Peter, MD Visit Date: 10/30/2019 Occupation: @GUAROCC @  Subjective:  Discuss MTX   History of Present Illness: [name] Horn is a 43 y.o. female with history of seropositive rheumatoid arthritis and keratoconjunctivitis sicca.  She continues to have pain in bilateral hands and bilateral feet.  She has noticed intermittent swelling in both hands.  She has intermittent pain in the right shoulder and right hip joint.  She has limited range of motion in the right hip and has been experiencing nocturnal pain when lying on her right side at night.  She has been taking Advil very sparingly for pain relief.   Activities of Daily Living:  Patient reports morning stiffness for 30-45 minutes.   Patient Reports nocturnal pain.  Difficulty dressing/grooming: Denies Difficulty climbing stairs: reports  Difficulty getting out of chair: Denies Difficulty using hands for taps, buttons, cutlery, and/or writing: Reports  Review of Systems  Constitutional: Positive for fatigue.  HENT: Positive for mouth dryness. Negative for mouth sores and nose dryness.   Eyes: Positive for dryness. Negative for pain and visual disturbance.  Respiratory: Negative for cough, hemoptysis, shortness of breath and difficulty breathing.   Cardiovascular: Negative for chest pain, palpitations, hypertension and swelling in legs/feet.  Gastrointestinal: Negative for blood in stool, constipation and diarrhea.  Endocrine: Negative for increased urination.  Genitourinary: Negative for painful urination.  Musculoskeletal: Positive for arthralgias, joint pain and morning stiffness. Negative for joint swelling, myalgias, muscle weakness, muscle tenderness and myalgias.  Skin: Negative for color change, pallor, rash, hair loss, nodules/bumps, skin tightness,  ulcers and sensitivity to sunlight.  Allergic/Immunologic: Negative for susceptible to infections.  Neurological: Negative for dizziness, numbness, headaches and weakness.  Hematological: Negative for swollen glands.  Psychiatric/Behavioral: Negative for depressed mood and sleep disturbance. The patient is not nervous/anxious.     PMFS History:  Patient Active Problem List   Diagnosis Date Noted  . Colitis 08/14/2019  . Constipation   . Gastroparesis 08/07/2019  . Nausea and vomiting 04/26/2017  . Nausea & vomiting 04/25/2017  . Chest pain 04/25/2017  . ANKLE, PAIN 11/24/2010  . ANXIETY 10/11/2010  . DVT 10/11/2010  . COSTOCHONDRITIS 03/03/2008  . CELLULITIS, GREAT TOE 12/01/2007  . SINUSITIS, ACUTE NOS 09/25/2007  . MENOPAUSE, PREMATURE 07/20/2007  . UTERINE CANCER, HX OF 07/16/2007  . Other postprocedural status(V45.89) 07/16/2007    Past Medical History:  Diagnosis Date  . Complication of anesthesia    " i TAKE A LIITLE BIT LONGER TO [place] UP "  . DVT (deep venous thrombosis) ([place])    x2  . Gastroparesis 08/07/2019  . Lymphedema   . Uterine cancer ([place])     Family History  Problem Relation Age of Onset  . Hypertension Other   . Hyperlipidemia Other   . Colon cancer Other        grandmother  . Hashimoto's thyroiditis Mother   . Throat cancer Father   . Arrhythmia Father    Past Surgical History:  Procedure Laterality Date  . APPENDECTOMY    . CHOLECYSTECTOMY    . ESOPHAGOGASTRODUODENOSCOPY N/A 04/30/2017   Procedure: ESOPHAGOGASTRODUODENOSCOPY (EGD);  Surgeon: Teena Irani, MD;  Location: [place];  Service:  Endoscopy;  Laterality: N/A;  . HERNIA REPAIR     x2  . KNEE SURGERY     x3  . MINIMALLY INVASIVE FORAMINOTOMY CERVICAL SPINE     C6-T1, Nitka  . SAVORY DILATION N/A 04/30/2017   Procedure: SAVORY DILATION;  Surgeon: Teena Irani, MD;  Location: [facility] ENDOSCOPY;  Service: Endoscopy;  Laterality: N/A;  . TONSILLECTOMY    . TOTAL ABDOMINAL HYSTERECTOMY      Sarcoma, s/p XRT   Social History   Social History Narrative  . Not on file   Immunization History  Administered Date(s) Administered  . Influenza,inj,Quad PF,6+ Mos 11/21/2017, 09/10/2018  . Influenza-Unspecified 10/22/2014, 10/23/2015     Objective: Vital Signs: There were no vitals taken for this visit.   Physical Exam Vitals signs and nursing note reviewed.  Constitutional:      Appearance: She is well-developed.  HENT:     Head: Normocephalic and atraumatic.  Eyes:     Conjunctiva/sclera: Conjunctivae normal.  Neck:     Musculoskeletal: Normal range of motion.  Cardiovascular:     Rate and Rhythm: Normal rate and regular rhythm.     Heart sounds: Normal heart sounds.  Pulmonary:     Effort: Pulmonary effort is normal.     Breath sounds: Normal breath sounds.  Abdominal:     General: Bowel sounds are normal.     Palpations: Abdomen is soft.  Lymphadenopathy:     Cervical: No cervical adenopathy.  Skin:    General: Skin is warm and dry.     Capillary Refill: Capillary refill takes less than 2 seconds.  Neurological:     Mental Status: She is alert and oriented to person, place, and time.  Psychiatric:        Behavior: Behavior normal.      Musculoskeletal Exam: C-spine, thoracic spine, and lumbar spine good ROM.  No midline spinal tenderness.  No SI joint tenderness.  Right shoulder has good range of motion with discomfort.  Left shoulder is full range of motion with no discomfort.  Elbow joints, wrist joints, MCPs, PIPs and DIPs have no obvious synovitis.  She has tenderness of the right second, third, and fourth MCP joints and the second and third PIP joints.  She has tenderness of the left second and third MCP joints.  She has complete fist formation bilaterally.  Right hip has slightly limited range of motion with discomfort.  Left hip has good range of motion with no discomfort.  Knee joints, ankle joints, MTPs, PIPs, DIPs good range of motion with no synovitis.   No warmth or effusion of bilateral knee joints.  No tenderness or swelling of ankle joints.  CDAI Exam: CDAI Score: 1  Patient Global: 6 mm; Provider Global: 4 mm Swollen: 0 ; Tender: 0  Joint Exam   No joint exam has been documented for this visit   There is currently no information documented on the homunculus. Go to the Rheumatology activity and complete the homunculus joint exam.  Investigation: No additional findings.  Imaging: [place] Extrem Up Bilat Comp  Result Date: 10/30/2019 Ultrasound examination of bilateral hands was performed per EULAR recommendations. Using 12 MHz transducer, grayscale and power Doppler bilateral second, third, and fifth MCP joints and bilateral wrist joints both dorsal and volar aspects were evaluated to look for synovitis or tenosynovitis. The findings were there was synovitis in bilateral second and third MCP joints on ultrasound examination. Right median nerve was 0.10 cm squares which was within normal limits  and left median nerve was 0.06 cm squares which was within normal limits. Impression: Ultrasound examination synovitis in bilateral hands.  Bilateral median nerves are within normal limits.  Xr Foot 2 Views Left  Result Date: 10/23/2019 PIP and DIP narrowing was noted.  No MTP or intertarsal narrowing was noted.  No erosive changes were noted.  A small inferior calcaneal spur was noted. Impression: These findings are consistent with osteoarthritis of the foot.  Xr Foot 2 Views Right  Result Date: 10/23/2019 PIP and DIP narrowing was noted.  No MTP or intertarsal narrowing was noted.  No erosive changes were noted.  A small inferior calcaneal spur was noted. Impression: These findings are consistent with osteoarthritis of the foot.  Xr Hand 2 View Left  Result Date: 10/23/2019 No MCP, PIP or DIP narrowing was noted.  No intercarpal joint space narrowing was noted.  No erosive changes were noted. Impression: Unremarkable x-ray of the hand.  Xr  Hand 2 View Right  Result Date: 10/23/2019 No MCP, PIP or DIP narrowing was noted.  No intercarpal or radiocarpal joint space narrowing was noted.  No erosive changes were noted. Impression: Unremarkable x-ray of the hand.   Recent Labs: Lab Results  Component Value Date   WBC 5.8 10/23/2019   HGB 11.4 (L) 10/23/2019   PLT 320 10/23/2019   NA 142 10/23/2019   K 4.6 10/23/2019   CL 106 10/23/2019   CO2 28 10/23/2019   GLUCOSE 102 (H) 10/23/2019   BUN 9 10/23/2019   CREATININE 0.93 10/23/2019   BILITOT 0.3 10/23/2019   ALKPHOS 87 08/06/2019   AST 19 10/23/2019   ALT 26 10/23/2019   PROT 6.5 10/23/2019   PROT 6.6 10/23/2019   ALBUMIN 4.2 08/06/2019   CALCIUM 9.5 10/23/2019   GFRAA 86 10/23/2019   QFTBGOLDPLUS NEGATIVE 10/23/2019    Speciality Comments: No specialty comments available.  Procedures:  No procedures performed Allergies: Other, Sulfa antibiotics, Sulfonamide derivatives, and Benadryl [diphenhydramine hcl]   Assessment / Plan:     Visit Diagnoses: Rheumatoid arthritis involving multiple sites with positive rheumatoid factor (HCC) - She had an ultrasound today of both hands which revealed active synovitis.  She has tenderness of several MCP and PIP joints as described above.  She continues to have persistent pain in bilateral hands, bilateral feet, and the right shoulder joint.  She has painful range of motion of the right shoulder on exam today.  She will be starting on methotrexate 0.6 mL once weekly and if labs are stable in 2 weeks she will increase to 0.8 mL once weekly.  She will start taking folic acid 2 mg by mouth daily.  Indications, contraindications, potential side effects of methotrexate were discussed today.  All questions were addressed and consent was obtained.  She will return for lab work in 2 weeks x 2 then 2 months then every 3 months.  Standing orders are in place.  She was advised to notify us if she cannot tolerate taking methotrexate.  She will  follow-up in the office in about 6 weeks.  Plan: methotrexate 50 MG/2ML injection, TUBERCULIN SYR 1CC/27GX1/2" (B-D TB SYRINGE 1CC/27GX1/2") 27G X 1/2" 1 ML MISC, folic acid (FOLVITE) 1 MG tablet  Drug Counseling Patient was counseled on the purpose, proper use, and adverse effects of methotrexate including nausea, infection, and signs and symptoms of pneumonitis.  Reviewed instructions with patient to take methotrexate weekly along with folic acid daily.  Discussed the importance of frequent monitoring of kidney  and liver function and blood counts, and provided patient with standing lab instructions.  Counseled patient to avoid NSAIDs and alcohol while on methotrexate.  Provided patient with educational materials on methotrexate and answered all questions.  Advised patient to get annual influenza vaccine and to get a pneumococcal vaccine if patient has not already had one.  Patient voiced understanding.  Patient consented to methotrexate use.  Will upload into chart.    High risk medication use -Methotrexate 0.6 mL once weekly for 2 weeks and if labs are stable at that time she will increase to 0.8 mL once weekly.  She will start taking folic acid 2 mg by mouth daily.  She will return for lab work in 2 weeks x 2 then 2 months then every 3 months.  Standing orders are in place.  Baseline immunosuppressive lab work was obtained on 10/23/2019. Plan: CBC with Differential/Platelet, COMPLETE METABOLIC PANEL WITH GFR  Keratoconjunctivitis sicca ([place]):   Pain in both hands - She had an ultrasound of both hands which revealed active synovitis today. Plan: [place] Extrem Up Bilat Comp  Primary osteoarthritis of both feet: She has chronic pain in both feet.  She has no obvious synovitis at this time.   Carpal tunnel syndrome of right wrist: She does not want to proceed with surgery at this time.   Raynaud's syndrome without gangrene: She has intermittent symptoms of Raynaud's.  No digital ulcerations or signs of  gangrene were noted on exam today.  Chronic pain of both knees: She has good range of motion of bilateral knee joints.  No warmth or effusion of knee joints were noted.  Chronic pain of both shoulders: She has painful but good range of motion of the right shoulder joint on exam.  Left shoulder has good range of motion with no discomfort at this time.  If her right shoulder pain persists she will return for cortisone injection in the future.  DDD (degenerative disc disease), cervical: She has good ROM.  No symptoms of radiculopathy at this time.   Myofascial pain: She has generalized muscle aches and muscle tenderness due to myofascial pain syndrome.  She has trapezius muscle tension and muscle tenderness bilaterally.  She has chronic fatigue related to insomnia.  She is been experiencing nocturnal pain which has been worsening her fatigue and generalized pain recently.  Other medical conditions are listed as follows:   Eosinophilic esophagitis  Gastroparesis  History of anxiety  UTERINE CANCER, HX OF  History of Clostridioides difficile colitis  History of shingles    Orders: Orders Placed This Encounter  Procedures  . [place] Extrem Up Bilat Comp  . CBC with Differential/Platelet  . COMPLETE METABOLIC PANEL WITH GFR   Meds ordered this encounter  Medications  . methotrexate 50 MG/2ML injection    Sig: Inject 0.6 mLs (15 mg total) into the skin once a week for 14 days, THEN 0.8 mLs (20 mg total) once a week for 14 days.    Dispense:  4 mL    Refill:  0  . TUBERCULIN SYR 1CC/27GX1/2" (B-D TB SYRINGE 1CC/27GX1/2") 27G X 1/2" 1 ML MISC    Sig: 12 Syringes by Does not apply route once a week.    Dispense:  12 each    Refill:  3  . folic acid (FOLVITE) 1 MG tablet    Sig: Take 2 tablets (2 mg total) by mouth daily.    Dispense:  180 tablet    Refill:  3  Face-to-face time spent with patient was 30 minutes. Greater than 50% of time was spent in counseling and coordination of  care.  Follow-Up Instructions: Return in about 4 weeks (around 11/27/2019) for Rheumatoid arthritis.   Ofilia Neas, PA-C   I examined and evaluated the patient with Hazel Sams PA.  Patient has tenderness in her hands.  Ultrasound examination performed by me today showed mild synovitis.  She has been having recurrent keratoconjunctivitis.  I also had discussion with Dr. Katy Fitch after her last visit.  He was in agreement with a trial of methotrexate.  Patient was in agreement today.  Indications side effects contraindications were discussed at length.  She will be starting on methotrexate.  The plan of care was discussed as noted above.  Bo Merino, MD  Note - This record has been created using Editor, commissioning.  Chart creation errors have been sought, but may not always  have been located. Such creation errors do not reflect on  the standard of medical care.

## 2019-10-30 NOTE — Progress Notes (Deleted)
Office Visit Note  Patient: [name] Horn             Date of Birth: 03-05-1976           MRN: [id number]             PCP: Burnadette Peter, MD Referring: Burnadette Peter, MD Visit Date: 10/30/2019 Occupation: @GUAROCC @  Subjective:  No chief complaint on file.   History of Present Illness: [name] Horn is a 43 y.o. female ***   Activities of Daily Living:  Patient reports morning stiffness for *** {minute/hour:19697}.   Patient {ACTIONS;DENIES/REPORTS:21021675::"Denies"} nocturnal pain.  Difficulty dressing/grooming: {ACTIONS;DENIES/REPORTS:21021675::"Denies"} Difficulty climbing stairs: {ACTIONS;DENIES/REPORTS:21021675::"Denies"} Difficulty getting out of chair: {ACTIONS;DENIES/REPORTS:21021675::"Denies"} Difficulty using hands for taps, buttons, cutlery, and/or writing: {ACTIONS;DENIES/REPORTS:21021675::"Denies"}  No Rheumatology ROS completed.   PMFS History:  Patient Active Problem List   Diagnosis Date Noted  . Colitis 08/14/2019  . Constipation   . Gastroparesis 08/07/2019  . Nausea and vomiting 04/26/2017  . Nausea & vomiting 04/25/2017  . Chest pain 04/25/2017  . ANKLE, PAIN 11/24/2010  . ANXIETY 10/11/2010  . DVT 10/11/2010  . COSTOCHONDRITIS 03/03/2008  . CELLULITIS, GREAT TOE 12/01/2007  . SINUSITIS, ACUTE NOS 09/25/2007  . MENOPAUSE, PREMATURE 07/20/2007  . UTERINE CANCER, HX OF 07/16/2007  . Other postprocedural status(V45.89) 07/16/2007    Past Medical History:  Diagnosis Date  . Complication of anesthesia    " i TAKE A LIITLE BIT LONGER TO [place] UP "  . DVT (deep venous thrombosis) ([place])    x2  . Gastroparesis 08/07/2019  . Lymphedema   . Uterine cancer ([place])     Family History  Problem Relation Age of Onset  . Hypertension Other   . Hyperlipidemia Other   . Colon cancer Other        grandmother  . Hashimoto's thyroiditis Mother   . Throat cancer Father   . Arrhythmia Father    Past Surgical History:  Procedure  Laterality Date  . APPENDECTOMY    . CHOLECYSTECTOMY    . ESOPHAGOGASTRODUODENOSCOPY N/A 04/30/2017   Procedure: ESOPHAGOGASTRODUODENOSCOPY (EGD);  Surgeon: Teena Irani, MD;  Location[facility] ENDOSCOPY;  Service: Endoscopy;  Laterality: N/A;  . HERNIA REPAIR     x2  . KNEE SURGERY     x3  . MINIMALLY INVASIVE FORAMINOTOMY CERVICAL SPINE     C6-T1, Nitka  . SAVORY DILATION N/A 04/30/2017   Procedure: SAVORY DILATION;  Surgeon: Teena Irani, MD;  Location: [facility] ENDOSCOPY;  Service: Endoscopy;  Laterality: N/A;  . TONSILLECTOMY    . TOTAL ABDOMINAL HYSTERECTOMY     Sarcoma, s/p XRT   Social History   Social History Narrative  . Not on file    There is no immunization history on file for this patient.   Objective: Vital Signs: There were no vitals taken for this visit.   Physical Exam   Musculoskeletal Exam: ***  CDAI Exam: CDAI Score: - Patient Global: -; Provider Global: - Swollen: -; Tender: - Joint Exam   No joint exam has been documented for this visit   There is currently no information documented on the homunculus. Go to the Rheumatology activity and complete the homunculus joint exam.  Investigation: No additional findings.  Imaging: Xr Foot 2 Views Left  Result Date: 10/23/2019 PIP and DIP narrowing was noted.  No MTP or intertarsal narrowing was noted.  No erosive changes were noted.  A small inferior calcaneal spur was noted. Impression: These findings are consistent with osteoarthritis  of the foot.  Xr Foot 2 Views Right  Result Date: 10/23/2019 PIP and DIP narrowing was noted.  No MTP or intertarsal narrowing was noted.  No erosive changes were noted.  A small inferior calcaneal spur was noted. Impression: These findings are consistent with osteoarthritis of the foot.  Xr Hand 2 View Left  Result Date: 10/23/2019 No MCP, PIP or DIP narrowing was noted.  No intercarpal joint space narrowing was noted.  No erosive changes were noted. Impression: Unremarkable  x-ray of the hand.  Xr Hand 2 View Right  Result Date: 10/23/2019 No MCP, PIP or DIP narrowing was noted.  No intercarpal or radiocarpal joint space narrowing was noted.  No erosive changes were noted. Impression: Unremarkable x-ray of the hand.   Recent Labs: Lab Results  Component Value Date   WBC 5.8 10/23/2019   HGB 11.4 (L) 10/23/2019   PLT 320 10/23/2019   NA 142 10/23/2019   K 4.6 10/23/2019   CL 106 10/23/2019   CO2 28 10/23/2019   GLUCOSE 102 (H) 10/23/2019   BUN 9 10/23/2019   CREATININE 0.93 10/23/2019   BILITOT 0.3 10/23/2019   ALKPHOS 87 08/06/2019   AST 19 10/23/2019   ALT 26 10/23/2019   PROT 6.5 10/23/2019   PROT 6.6 10/23/2019   ALBUMIN 4.2 08/06/2019   CALCIUM 9.5 10/23/2019   GFRAA 86 10/23/2019   QFTBGOLDPLUS NEGATIVE 10/23/2019    Speciality Comments: No specialty comments available.  Procedures:  No procedures performed Allergies: Other, Sulfa antibiotics, Sulfonamide derivatives, and Benadryl [diphenhydramine hcl]   Assessment / Plan:     Visit Diagnoses: Rheumatoid arthritis involving multiple sites with positive rheumatoid factor (HCC)  High risk medication use  Keratoconjunctivitis sicca (HCC)  Primary osteoarthritis of both feet  Carpal tunnel syndrome of right wrist  Eosinophilic esophagitis  Raynaud's syndrome without gangrene  Chronic pain of both knees  Chronic pain of both shoulders  DDD (degenerative disc disease), cervical  Myofascial pain  Gastroparesis  History of anxiety  UTERINE CANCER, HX OF  History of Clostridioides difficile colitis  History of shingles  Orders: No orders of the defined types were placed in this encounter.  No orders of the defined types were placed in this encounter.   Face-to-face time spent with patient was *** minutes. Greater than 50% of time was spent in counseling and coordination of care.  Follow-Up Instructions: No follow-ups on file.   Bo Merino, MD  Note -  This record has been created using Editor, commissioning.  Chart creation errors have been sought, but may not always  have been located. Such creation errors do not reflect on  the standard of medical care.

## 2019-11-13 ENCOUNTER — Other Ambulatory Visit: Payer: Self-pay

## 2019-11-13 ENCOUNTER — Telehealth: Payer: Self-pay | Admitting: Pharmacist

## 2019-11-13 DIAGNOSIS — Z79899 Other long term (current) drug therapy: Secondary | ICD-10-CM

## 2019-11-13 NOTE — Telephone Encounter (Signed)
Patient came to office for labs.  She is having major dental work in the near future.  She wanted to know how long she needs to hold MTX.  Advised patient to hold 1 week prior to procedure and to call our office 1 week after to assess if she can resume MTX at that time.  Patient verbalized understanding.  Mariella Saa, PharmD, [place], [facility] [phone]  11/13/2019 4:37 PM

## 2019-11-14 LAB — CBC WITH DIFFERENTIAL/PLATELET
Absolute Monocytes: 459 cells/uL (ref 200–950)
Basophils Absolute: 41 cells/uL (ref 0–200)
Basophils Relative: 0.5 %
Eosinophils Absolute: 238 cells/uL (ref 15–500)
Eosinophils Relative: 2.9 %
HCT: 38.8 % (ref 35.0–45.0)
Hemoglobin: 13.1 g/dL (ref 11.7–15.5)
Lymphs Abs: 2444 cells/uL (ref 850–3900)
MCH: 31.3 pg (ref 27.0–33.0)
MCHC: 33.8 g/dL (ref 32.0–36.0)
MCV: 92.8 fL (ref 80.0–100.0)
MPV: 9.5 fL (ref 7.5–12.5)
Monocytes Relative: 5.6 %
Neutro Abs: 5018 cells/uL (ref 1500–7800)
Neutrophils Relative %: 61.2 %
Platelets: 359 10*3/uL (ref 140–400)
RBC: 4.18 10*6/uL (ref 3.80–5.10)
RDW: 13.4 % (ref 11.0–15.0)
Total Lymphocyte: 29.8 %
WBC: 8.2 10*3/uL (ref 3.8–10.8)

## 2019-11-14 LAB — COMPLETE METABOLIC PANEL WITH GFR
AG Ratio: 1.7 (calc) (ref 1.0–2.5)
ALT: 22 U/L (ref 6–29)
AST: 16 U/L (ref 10–35)
Albumin: 4.5 g/dL (ref 3.6–5.1)
Alkaline phosphatase (APISO): 93 U/L (ref 31–125)
BUN: 15 mg/dL (ref 7–25)
CO2: 24 mmol/L (ref 20–32)
Calcium: 10 mg/dL (ref 8.6–10.2)
Chloride: 104 mmol/L (ref 98–110)
Creat: 1.01 mg/dL (ref 0.50–1.10)
GFR, Est African American: 78 mL/min/{1.73_m2} (ref >=60)
GFR, Est Non African American: 67 mL/min/{1.73_m2} (ref >=60)
Globulin: 2.7 g/dL (calc) (ref 1.9–3.7)
Glucose, Bld: 94 mg/dL (ref 65–99)
Potassium: 4.5 mmol/L (ref 3.5–5.3)
Sodium: 140 mmol/L (ref 135–146)
Total Bilirubin: 0.3 mg/dL (ref 0.2–1.2)
Total Protein: 7.2 g/dL (ref 6.1–8.1)

## 2019-11-14 NOTE — Progress Notes (Signed)
Within normal limits

## 2019-11-14 NOTE — Progress Notes (Signed)
Virtual Visit via Telephone Note  I connected with [place] on 11/27/19 at 11:15 AM EST by telephone and verified that I am speaking with the correct person using two identifiers.  Location: Patient: Home  Provider: Clinic  This service was conducted via virtual visit.  The patient was located at home. I was located in my office.  Consent was obtained prior to the virtual visit and is aware of possible charges through their insurance for this visit.  The patient is an established patient.  Dr. Estanislado Pandy, MD conducted the virtual visit and Hazel Sams, PA-C acted as scribe during the service.  Office staff helped with scheduling follow up visits after the service was conducted.   I discussed the limitations, risks, security and privacy concerns of performing an evaluation and management service by telephone and the availability of in person appointments. I also discussed with the patient that there may be a patient responsible charge related to this service. The patient expressed understanding and agreed to proceed.  CC: Medication monitoring  History of Present Illness: Patient is a 43 year old with a past medical history of seropositive rheumatoid arthritis, keratoconjunctivitis sicca, and osteoarthritis.  She was started on injectable MTX 1 month ago. She has been injecting MTX 0.8 ml sq for the past 2 doses.  She is tolerating MTX without any side effects. She is taking folic acid 2 mg po daily. She has noticed less frequent and milder eye inflammation since starting on MTX.  She is having persistent pain in both shoulder joints, both hands, and both feet.  She has intermittent swelling and joint stiffness in both hands.   Review of Systems  Constitutional: Negative for fever and malaise/fatigue.  HENT:       +Dry mouth  Eyes: Negative for photophobia, pain, discharge and redness.  Respiratory: Negative for cough, shortness of breath and wheezing.   Cardiovascular: Negative for chest  pain and palpitations.  Gastrointestinal: Negative for blood in stool, constipation and diarrhea.  Genitourinary: Negative for dysuria.  Musculoskeletal: Positive for joint pain. Negative for back pain, myalgias and neck pain.       +Joint swelling +Morning stiffness   Skin: Negative for rash.  Neurological: Negative for dizziness and headaches.  Psychiatric/Behavioral: Negative for depression. The patient is not nervous/anxious and does not have insomnia.       Observations/Objective: Physical Exam  Constitutional: She is oriented to person, place, and time.  Neurological: She is alert and oriented to person, place, and time.  Psychiatric: Mood, memory, affect and judgment normal.   Patient reports morning stiffness for 30  minutes.   Patient reports nocturnal pain.  Difficulty dressing/grooming: Denies Difficulty climbing stairs: Denies Difficulty getting out of chair: Denies Difficulty using hands for taps, buttons, cutlery, and/or writing: Reports    Assessment and Plan: Visit Diagnoses: Rheumatoid arthritis with positive rheumatoid factor: She has persistent discomfort in both shoulder joints, both hands, and both feet.  She has intermittent inflammation in MCPs and MTP joints.  She has ongoing morning stiffness.  She has had 2 doses of injectable MTX 0.8 ml sq once weekly and folic acid 2 mg po daily.  She will require more time on the current treatment regimen.  She was advised to notify us if she develops increased joint pain or joint swelling.  She will follow up in 3 months.   High risk medication use: MTX 0.8 ml sq once weekly and folic acid 2 mg po daily. CBC and CMP  were drawn on 11/13/19 and were WNL. She is due to update lab work. She was advised to hold MTX 2 weeks prior to oral surgery and then she will require clearance by the oral surgeon prior to restarting.   Keratoconjunctivitis sicca ([place]) - She was referred by Dr. Katy Fitch for recurrent conjunctivitis and  keratoconjunctivitis.  AVISE index -2.4, all labs were negative except RF IgA 39.  She has had intermittent mild eye irritation bilaterally, but her symptoms have been improving since starting on injectable MTX. She will continue on the current treatment regimen.   Rheumatoid factor positive: Rheumatoid factor IgA 39 on 10/07/19.    Eosinophilic esophagitis - She has taken oral prednisone per gastroenterology in the past.  Carpal tunnel syndrome of right wrist: She continues to be symptomatic. She wears a daytime and nighttime splint on a regular basis.  She is not ready to proceed with surgery at this time. She has been wearing a brace to help.  Raynaud's syndrome without gangrene -She has intermittent symptoms of Raynaud's.  No digital ulcerations or signs of gangrene.   Chronic pain of both shoulders - Related to sports injuries:   Chronic pain of both knees - History of meniscal tear repair in the past.  She has had arthroscopic surgery on both knee joints according to the patient.   DDD (degenerative disc disease), cervical - Patient had foraminotomy by Dr. Louanne Skye in the past.    Myofascial pain: She has generalized muscle aches and muscle tenderness.  She has chronic fatigue related to insomnia.   Other medical conditions are listed as follows:   History of anxiety  Gastroparesis  UTERINE CANCER, HX OF  History of shingles  History of Clostridioides difficile colitis   Follow Up Instructions: She will follow up in 3 months.     I discussed the assessment and treatment plan with the patient. The patient was provided an opportunity to ask questions and all were answered. The patient agreed with the plan and demonstrated an understanding of the instructions.   The patient was advised to call back or seek an in-person evaluation if the symptoms worsen or if the condition fails to improve as anticipated.  I provided 20 minutes of non-face-to-face time during this  encounter.   Bo Merino, MD   Scribed by-  Hazel Sams, PA-C

## 2019-11-15 ENCOUNTER — Ambulatory Visit: Payer: Managed Care, Other (non HMO) | Admitting: Rheumatology

## 2019-11-25 ENCOUNTER — Other Ambulatory Visit: Payer: Self-pay | Admitting: *Deleted

## 2019-11-25 ENCOUNTER — Telehealth: Payer: Self-pay | Admitting: Rheumatology

## 2019-11-25 DIAGNOSIS — Z79899 Other long term (current) drug therapy: Secondary | ICD-10-CM

## 2019-11-25 NOTE — Telephone Encounter (Signed)
Lab orders released.  

## 2019-11-25 NOTE — Telephone Encounter (Signed)
Patient is scheduled for a virtual appt this week ,and due for labs. Patient would like to go to [facility] on [address] for labs. Please release orders. Patient aware orders will not be released before Tuesday 11/26/2019.

## 2019-11-27 ENCOUNTER — Encounter: Payer: Self-pay | Admitting: Rheumatology

## 2019-11-27 ENCOUNTER — Telehealth (INDEPENDENT_AMBULATORY_CARE_PROVIDER_SITE_OTHER): Payer: Managed Care, Other (non HMO) | Admitting: Rheumatology

## 2019-11-27 ENCOUNTER — Other Ambulatory Visit: Payer: Self-pay

## 2019-11-27 DIAGNOSIS — M19072 Primary osteoarthritis, left ankle and foot: Secondary | ICD-10-CM

## 2019-11-27 DIAGNOSIS — M3501 Sicca syndrome with keratoconjunctivitis: Secondary | ICD-10-CM

## 2019-11-27 DIAGNOSIS — Z8659 Personal history of other mental and behavioral disorders: Secondary | ICD-10-CM

## 2019-11-27 DIAGNOSIS — K3184 Gastroparesis: Secondary | ICD-10-CM

## 2019-11-27 DIAGNOSIS — M19071 Primary osteoarthritis, right ankle and foot: Secondary | ICD-10-CM | POA: Diagnosis not present

## 2019-11-27 DIAGNOSIS — Z79899 Other long term (current) drug therapy: Secondary | ICD-10-CM

## 2019-11-27 DIAGNOSIS — M0579 Rheumatoid arthritis with rheumatoid factor of multiple sites without organ or systems involvement: Secondary | ICD-10-CM

## 2019-11-27 DIAGNOSIS — I7300 Raynaud's syndrome without gangrene: Secondary | ICD-10-CM

## 2019-11-27 DIAGNOSIS — M5030 Other cervical disc degeneration, unspecified cervical region: Secondary | ICD-10-CM

## 2019-11-27 DIAGNOSIS — G5601 Carpal tunnel syndrome, right upper limb: Secondary | ICD-10-CM

## 2019-11-27 DIAGNOSIS — M7918 Myalgia, other site: Secondary | ICD-10-CM

## 2019-11-27 DIAGNOSIS — K200 Eosinophilic esophagitis: Secondary | ICD-10-CM

## 2019-11-28 ENCOUNTER — Telehealth: Payer: Self-pay | Admitting: Rheumatology

## 2019-11-28 NOTE — Telephone Encounter (Signed)
Patient is going to get labs drawn this afternoon, will refill MTX after labs results. Patient verbalized understanding.

## 2019-11-28 NOTE — Telephone Encounter (Signed)
Patient left a message requesting refill on MTX sent to [facility] on [address]. Patient is out due to dosage increase, and is due for next dose on Friday. Please call to advise.

## 2019-11-28 NOTE — Telephone Encounter (Signed)
Attempted to contact patient and left message on machine for patient to call the office.   Patient needs labs after increasing dose of MTX to 0.20ml for 2 weeks.

## 2019-11-29 LAB — COMPLETE METABOLIC PANEL WITH GFR
AG Ratio: 1.9 (calc) (ref 1.0–2.5)
ALT: 23 U/L (ref 6–29)
AST: 19 U/L (ref 10–35)
Albumin: 4.4 g/dL (ref 3.6–5.1)
Alkaline phosphatase (APISO): 70 U/L (ref 31–125)
BUN/Creatinine Ratio: 6 (calc) (ref 6–22)
BUN: 5 mg/dL — ABNORMAL LOW (ref 7–25)
CO2: 22 mmol/L (ref 20–32)
Calcium: 9.7 mg/dL (ref 8.6–10.2)
Chloride: 109 mmol/L (ref 98–110)
Creat: 0.81 mg/dL (ref 0.50–1.10)
GFR, Est African American: 102 mL/min/{1.73_m2} (ref >=60)
GFR, Est Non African American: 88 mL/min/{1.73_m2} (ref >=60)
Globulin: 2.3 g/dL (calc) (ref 1.9–3.7)
Glucose, Bld: 114 mg/dL — ABNORMAL HIGH (ref 65–99)
Potassium: 4 mmol/L (ref 3.5–5.3)
Sodium: 142 mmol/L (ref 135–146)
Total Bilirubin: 0.3 mg/dL (ref 0.2–1.2)
Total Protein: 6.7 g/dL (ref 6.1–8.1)

## 2019-11-29 LAB — CBC WITH DIFFERENTIAL/PLATELET
Absolute Monocytes: 299 cells/uL (ref 200–950)
Basophils Absolute: 27 cells/uL (ref 0–200)
Basophils Relative: 0.4 %
Eosinophils Absolute: 41 cells/uL (ref 15–500)
Eosinophils Relative: 0.6 %
HCT: 38.4 % (ref 35.0–45.0)
Hemoglobin: 12.7 g/dL (ref 11.7–15.5)
Lymphs Abs: 1408 cells/uL (ref 850–3900)
MCH: 31.3 pg (ref 27.0–33.0)
MCHC: 33.1 g/dL (ref 32.0–36.0)
MCV: 94.6 fL (ref 80.0–100.0)
MPV: 9.8 fL (ref 7.5–12.5)
Monocytes Relative: 4.4 %
Neutro Abs: 5025 cells/uL (ref 1500–7800)
Neutrophils Relative %: 73.9 %
Platelets: 290 10*3/uL (ref 140–400)
RBC: 4.06 10*6/uL (ref 3.80–5.10)
RDW: 13.4 % (ref 11.0–15.0)
Total Lymphocyte: 20.7 %
WBC: 6.8 10*3/uL (ref 3.8–10.8)

## 2019-11-29 MED ORDER — METHOTREXATE SODIUM CHEMO INJECTION 50 MG/2ML: INTRAMUSCULAR | 0 refills | Status: DC

## 2019-11-29 NOTE — Progress Notes (Signed)
Labs are within normal limits.  Glucose is mildly elevated.

## 2019-11-29 NOTE — Telephone Encounter (Signed)
Last Visit: 11/27/2019 telemedicine  Next Visit: message sent to the front desk to schedule  Labs: 11/28/2019 Labs are within normal limits. Glucose is mildly elevated.  Okay to refill per Dr. Estanislado Pandy.

## 2019-12-11 ENCOUNTER — Telehealth: Payer: Self-pay | Admitting: Rheumatology

## 2019-12-11 NOTE — Telephone Encounter (Signed)
FYI: patient want to let you know her oral surgery is scheduled for this Friday 12/13/2019.

## 2019-12-12 NOTE — Telephone Encounter (Signed)
Returned patient call.  She called to notify the office her surgery was scheduled on 12/13/2019.  It was not scheduled at the time of her tele-visit on 12/2.  She held her MTX last week and will this Friday per Dr. Arlean Hopping recommendation.  She understands she should not resume MTX until she has clearance from provider.  All questions encouraged and answered. Instructed patient to call with any other questions or concerns.  Mariella Saa, PharmD, [facility], [facility] Pharmacist [phone]  12/12/2019 10:29 AM

## 2019-12-26 ENCOUNTER — Telehealth: Payer: Self-pay | Admitting: Rheumatology

## 2019-12-26 NOTE — Telephone Encounter (Signed)
Patient left a voicemail stating she had oral surgery 2 weeks ago and discontinued taking her Methotrexate.  Patient is requesting a return call to let her know if she can restart the medication tomorrow or if she needs to wait another week.

## 2019-12-26 NOTE — Telephone Encounter (Signed)
Spoke with Dr. Estanislado Pandy and patient can resume MTX tomorrow if there is no infection and is healing well.  Advised patient and patient verbalized understanding, patient states she has finished the antibiotics as well.

## 2020-01-17 ENCOUNTER — Telehealth: Payer: Self-pay | Admitting: Rheumatology

## 2020-01-17 NOTE — Telephone Encounter (Signed)
Patient advised a 90 day supply was sent to the pharmacy on 11/29/19. Patient will check with the pharmacy.

## 2020-01-17 NOTE — Telephone Encounter (Signed)
Patient called stating she is due to take her Methotrexate injection today and forgot that she was out of medication.  Patient is requesting prescription refill to be sent to [facility] at [address] [place].  Patient is requesting a return call.

## 2020-01-19 DIAGNOSIS — C55 Malignant neoplasm of uterus, part unspecified: Secondary | ICD-10-CM | POA: Insufficient documentation

## 2020-01-19 DIAGNOSIS — Z9889 Other specified postprocedural states: Secondary | ICD-10-CM | POA: Insufficient documentation

## 2020-01-19 DIAGNOSIS — I493 Ventricular premature depolarization: Secondary | ICD-10-CM | POA: Insufficient documentation

## 2020-01-20 ENCOUNTER — Telehealth: Payer: Self-pay | Admitting: Rheumatology

## 2020-01-20 MED ORDER — SHINGRIX 50 MCG/0.5ML IM SUSR: 0.5000 mL | Freq: Once | INTRAMUSCULAR | 0 refills | Status: AC

## 2020-01-20 NOTE — Telephone Encounter (Signed)
Patient states due to her age, patient needs a written rx to receive Singles vaccine. Please fax it to [facility] on [address], and advise patient when faxed over.

## 2020-01-20 NOTE — Telephone Encounter (Signed)
Patient advised that prescription has been sent to the pharmacy.

## 2020-01-21 ENCOUNTER — Telehealth: Payer: Self-pay | Admitting: Rheumatology

## 2020-01-21 NOTE — Telephone Encounter (Signed)
Patient called stating she went to [facility] to get her Shingles vaccine and was told she can't get the vaccine because she is under 100 even though Dr. Estanislado Pandy sent in the prescription.  Patient is requesting a return call.

## 2020-01-21 NOTE — Telephone Encounter (Signed)
I called patient, patient has a history of shingles. Per [employer], advised patient to contact PCP. Patient has been dismissed from PCP and is currently looking for a new PCP.

## 2020-01-22 ENCOUNTER — Telehealth: Payer: Self-pay | Admitting: Rheumatology

## 2020-01-22 NOTE — Telephone Encounter (Signed)
Patient advised the reason the prescription was denied is because a prescription for the MTX was sent in for a 90 day supply on 11/29/19. Patient will check with pharmacy.

## 2020-01-22 NOTE — Telephone Encounter (Signed)
Patient called requesting prescription refill of Methotrexate to be sent to [facility] at [place].  Patient states she called the pharmacy today and was told that Dr. Estanislado Pandy denied her prescription.

## 2020-01-24 ENCOUNTER — Telehealth: Payer: Self-pay | Admitting: *Deleted

## 2020-01-24 NOTE — Telephone Encounter (Signed)
Please advise patient to hold MTX until she has completed the course of fluconazole and her symptoms have completely resolved.

## 2020-01-24 NOTE — Telephone Encounter (Signed)
Patient advised to hold MTX until she has completed the course of fluconazole and her symptoms have completely resolved. Patient verbalized understanding.

## 2020-01-24 NOTE — Telephone Encounter (Signed)
Received refill request from pharmacy. Patient states she does not currently need a refill. Patient states she has been diagnosed with thrush. Patient states she was prescribed the GI cocktail and Fluconazole. Patient would like to know if she can take her MTX injection. Please advise.

## 2020-01-26 ENCOUNTER — Other Ambulatory Visit: Payer: Self-pay

## 2020-01-26 ENCOUNTER — Encounter (HOSPITAL_COMMUNITY): Payer: Self-pay | Admitting: Emergency Medicine

## 2020-01-26 ENCOUNTER — Emergency Department (HOSPITAL_COMMUNITY)
Admission: EM | Admit: 2020-01-26 | Discharge: 2020-01-26 | Disposition: A | Discharge status: Home / Self Care | Payer: Managed Care, Other (non HMO) | Attending: Emergency Medicine | Admitting: Emergency Medicine

## 2020-01-26 DIAGNOSIS — R197 Diarrhea, unspecified: Secondary | ICD-10-CM | POA: Diagnosis not present

## 2020-01-26 DIAGNOSIS — Z79899 Other long term (current) drug therapy: Secondary | ICD-10-CM | POA: Diagnosis not present

## 2020-01-26 DIAGNOSIS — E669 Obesity, unspecified: Secondary | ICD-10-CM | POA: Insufficient documentation

## 2020-01-26 DIAGNOSIS — R112 Nausea with vomiting, unspecified: Secondary | ICD-10-CM | POA: Insufficient documentation

## 2020-01-26 DIAGNOSIS — Z86718 Personal history of other venous thrombosis and embolism: Secondary | ICD-10-CM | POA: Diagnosis not present

## 2020-01-26 DIAGNOSIS — Z8542 Personal history of malignant neoplasm of other parts of uterus: Secondary | ICD-10-CM | POA: Diagnosis not present

## 2020-01-26 DIAGNOSIS — Z6831 Body mass index (BMI) 31.0-31.9, adult: Secondary | ICD-10-CM | POA: Diagnosis not present

## 2020-01-26 DIAGNOSIS — K3184 Gastroparesis: Secondary | ICD-10-CM | POA: Diagnosis not present

## 2020-01-26 HISTORY — DX: Rheumatoid arthritis, unspecified: M06.9

## 2020-01-26 LAB — URINALYSIS, ROUTINE W REFLEX MICROSCOPIC
Bilirubin Urine: NEGATIVE
Glucose, UA: NEGATIVE mg/dL
Hgb urine dipstick: NEGATIVE
Ketones, ur: NEGATIVE mg/dL
Leukocytes,Ua: NEGATIVE
Nitrite: NEGATIVE
Protein, ur: NEGATIVE mg/dL
Specific Gravity, Urine: 1.009 (ref 1.005–1.030)
pH: 8 (ref 5.0–8.0)

## 2020-01-26 LAB — COMPREHENSIVE METABOLIC PANEL
ALT: 29 U/L (ref 0–44)
AST: 22 U/L (ref 15–41)
Albumin: 4.1 g/dL (ref 3.5–5.0)
Alkaline Phosphatase: 71 U/L (ref 38–126)
Anion gap: 8 (ref 5–15)
BUN: <5 mg/dL — ABNORMAL LOW (ref 6–20)
CO2: 21 mmol/L — ABNORMAL LOW (ref 22–32)
Calcium: 9.5 mg/dL (ref 8.9–10.3)
Chloride: 115 mmol/L — ABNORMAL HIGH (ref 98–111)
Creatinine, Ser: 0.78 mg/dL (ref 0.44–1.00)
GFR calc Af Amer: >60 mL/min (ref >60)
GFR calc non Af Amer: >60 mL/min (ref >60)
Glucose, Bld: 110 mg/dL — ABNORMAL HIGH (ref 70–99)
Potassium: 4 mmol/L (ref 3.5–5.1)
Sodium: 144 mmol/L (ref 135–145)
Total Bilirubin: 0.7 mg/dL (ref 0.3–1.2)
Total Protein: 6.7 g/dL (ref 6.5–8.1)

## 2020-01-26 LAB — CBC
HCT: 41.2 % (ref 36.0–46.0)
Hemoglobin: 13.3 g/dL (ref 12.0–15.0)
MCH: 31 pg (ref 26.0–34.0)
MCHC: 32.3 g/dL (ref 30.0–36.0)
MCV: 96 fL (ref 80.0–100.0)
Platelets: 301 10*3/uL (ref 150–400)
RBC: 4.29 MIL/uL (ref 3.87–5.11)
RDW: 14.3 % (ref 11.5–15.5)
WBC: 6.7 10*3/uL (ref 4.0–10.5)
nRBC: 0 % (ref 0.0–0.2)

## 2020-01-26 LAB — LIPASE, BLOOD: Lipase: 22 U/L (ref 11–51)

## 2020-01-26 MED ORDER — LIDOCAINE VISCOUS HCL 2 % MT SOLN
15.0000 mL | Freq: Once | OROMUCOSAL | Status: AC
  Administered 2020-01-26: 15 mL via OROMUCOSAL
  Filled 2020-01-26: qty 15

## 2020-01-26 MED ORDER — METOCLOPRAMIDE HCL 5 MG/ML IJ SOLN
10.0000 mg | Freq: Once | INTRAMUSCULAR | Status: AC
  Administered 2020-01-26: 10 mg via INTRAVENOUS
  Filled 2020-01-26: qty 2

## 2020-01-26 MED ORDER — ONDANSETRON 4 MG PO TBDP
4.0000 mg | ORAL_TABLET | Freq: Once | ORAL | Status: AC | PRN
  Administered 2020-01-26: 4 mg via ORAL
  Filled 2020-01-26: qty 1

## 2020-01-26 MED ORDER — DICYCLOMINE HCL 10 MG PO CAPS
20.0000 mg | ORAL_CAPSULE | Freq: Once | ORAL | Status: AC
  Administered 2020-01-26: 20 mg via ORAL
  Filled 2020-01-26: qty 2

## 2020-01-26 MED ORDER — FAMOTIDINE IN NACL 20-0.9 MG/50ML-% IV SOLN
20.0000 mg | Freq: Once | INTRAVENOUS | Status: AC
  Administered 2020-01-26: 19:00:00 20 mg via INTRAVENOUS
  Filled 2020-01-26: qty 50

## 2020-01-26 MED ORDER — MORPHINE SULFATE (PF) 4 MG/ML IV SOLN
4.0000 mg | Freq: Once | INTRAVENOUS | Status: AC
  Administered 2020-01-26: 19:00:00 4 mg via INTRAVENOUS
  Filled 2020-01-26: qty 1

## 2020-01-26 MED ORDER — SODIUM CHLORIDE 0.9 % IV BOLUS
1000.0000 mL | Freq: Once | INTRAVENOUS | Status: AC
  Administered 2020-01-26: 1000 mL via INTRAVENOUS

## 2020-01-26 NOTE — Discharge Instructions (Addendum)
Please follow-up with your gastroenterologist.  Please take Zofran around-the-clock as we discussed TP nausea under control so you are able to take your fluconazole and other medications.  Please return the ED if you spike a high fever/are unable to tolerate your medications.  Or have any other concerning symptoms.  I recommend a bland diet for the next several days.  Please read the information on diet for diarrhea above.  Please do not take any more Imodium.

## 2020-01-26 NOTE — ED Provider Notes (Signed)
[facility] EMERGENCY DEPARTMENT Provider Note   CSN: [id number] Arrival date & time: 01/26/20  1603     History Chief Complaint  Patient presents with  . Diarrhea  . Emesis    [name] Horn is a 44 y.o. female.   HPI  Patient is a 44 year old female with history of cholecystectomy, total abdominal hysterectomy and bilateral salpingostomy, appendectomy, numerous hernia repairs  She is medical history is significant for C. 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  She is followed by gastroenterology specifically Dr. Nanetta Batty with [facility]   Patient presents today for nausea, vomiting, diarrhea.  She states that her symptoms began Friday with diarrhea and she took some Imodium without relief.  Saturday she states she vomited several times was unable to tolerate p.o.  She states she was recently prescribed fluconazole for candidal esophagitis which was thought to be result of being on her DMARDs for rheumatoid arthritis.  She has not been able to tolerate fluconazole for the past 2 days.  She states that today she has not been able to tolerate p.o. at all.  Patient is is vomiting is nonbloody nonbilious, denies any blood or unusual stool apart from the loose/watery stool.  States that she is not having the severity abdominal pain that she had with her prior episodes of C. difficile.  Patient states that she has had C. difficile twice in the past been treated with vancomycin.  She denies any recent antibiotic use (last use was amoxicillin in December for dental infection), denies any recent travel, denies fevers, bloody diarrhea, known sick exposure.   Patient also denies any headaches, dizziness, chest pain, shortness of breath.  Denies any cough or congestion.  Denies any known exposure to Covid.    Patient denies any vaginal itching, pain, dysuria,  frequency, urgency flank pain or menstrual irregularities--she has not had her period since her hysterectomy. Past Medical History:  Diagnosis Date  . Complication of anesthesia    " i TAKE A LIITLE BIT LONGER TO [place] UP "  . DVT (deep venous thrombosis) ([place])    x2  . Gastroparesis 08/07/2019  . Lymphedema   . RA (rheumatoid arthritis) ([place])   . Uterine cancer [facility])     Patient Active Problem List   Diagnosis Date Noted  . Colitis 08/14/2019  . Constipation   . Gastroparesis 08/07/2019  . Nausea and vomiting 04/26/2017  . Nausea & vomiting 04/25/2017  . Chest pain 04/25/2017  . ANKLE, PAIN 11/24/2010  . ANXIETY 10/11/2010  . DVT 10/11/2010  . COSTOCHONDRITIS 03/03/2008  . CELLULITIS, GREAT TOE 12/01/2007  . SINUSITIS, ACUTE NOS 09/25/2007  . MENOPAUSE, PREMATURE 07/20/2007  . UTERINE CANCER, HX OF 07/16/2007  . Other postprocedural status(V45.89) 07/16/2007    Past Surgical History:  Procedure Laterality Date  . APPENDECTOMY    . CHOLECYSTECTOMY    . ESOPHAGOGASTRODUODENOSCOPY N/A 04/30/2017   Procedure: ESOPHAGOGASTRODUODENOSCOPY (EGD);  Surgeon: Teena Irani, MD;  Location: [facility] ENDOSCOPY;  Service: Endoscopy;  Laterality: N/A;  . HERNIA REPAIR     x2  . KNEE SURGERY     x3  . MINIMALLY INVASIVE FORAMINOTOMY CERVICAL SPINE     C6-T1, Nitka  . SAVORY DILATION N/A 04/30/2017   Procedure: SAVORY DILATION;  Surgeon: Teena Irani, MD;  Location: [facility] ENDOSCOPY;  Service: Endoscopy;  Laterality: N/A;  . TONSILLECTOMY    . TOTAL ABDOMINAL HYSTERECTOMY     Sarcoma,  s/p XRT     OB History   No obstetric history on file.     Family History  Problem Relation Age of Onset  . Hypertension Other   . Hyperlipidemia Other   . Colon cancer Other        grandmother  . Hashimoto's thyroiditis Mother   . Throat cancer Father   . Arrhythmia Father     Social History   Tobacco Use  . Smoking status: Never Smoker  . Smokeless tobacco: Never Used  Substance Use Topics  .  Alcohol use: Not Currently  . Drug use: No    Home Medications Prior to Admission medications   Medication Sig Start Date End Date Taking? Authorizing Provider  amphetamine-dextroamphetamine (ADDERALL) 20 MG tablet Take 20 mg by mouth 3 (three) times daily.    [provider]  atropine 1 % ophthalmic solution Place 1 drop into the left eye daily as needed (for dry eye).    [provider]  Bepotastine Besilate (BEPREVE) 1.5 % SOLN Place 1 drop into both eyes 2 (two) times a day.    [provider]  budesonide (PULMICORT) 0.25 MG/2ML nebulizer solution Swallow 30ml po BID.  Rinse and spit after.  No food or drink for 30 minutes after administration. 08/28/19   [provider]  cycloSPORINE (RESTASIS) 0.05 % ophthalmic emulsion Place 1 drop into both eyes 2 (two) times daily.    [provider]  dicyclomine (BENTYL) 10 MG capsule Take 10 mg by mouth daily as needed for nausea/vomiting. 01/02/19   [provider]  fluticasone (FLOVENT HFA) 220 MCG/ACT inhaler Inhale 2 puffs into the lungs 2 (two) times daily.    [provider]  folic acid (FOLVITE) 1 MG tablet Take 2 tablets (2 mg total) by mouth daily. 10/30/19   Bo Merino, MD  furosemide (LASIX) 20 MG tablet Take 20 mg by mouth every Monday, Wednesday, and Friday.    [provider]  hyoscyamine (LEVSIN, ANASPAZ) 0.125 MG tablet Take 0.125 mg by mouth every 4 (four) hours as needed for cramping.  07/25/17   [provider]  methotrexate 50 MG/2ML injection Inject 0.41mL into the skin once weekly. 11/29/19   Bo Merino, MD  metoCLOPramide (REGLAN) 10 MG tablet Take 10 mg by mouth daily.     [provider]  ondansetron (ZOFRAN-ODT) 4 MG disintegrating tablet Take 1 tablet (4 mg total) by mouth every 8 (eight) hours as needed for nausea or vomiting. 08/05/19   Charlann Lange, PA-C  oxyCODONE-acetaminophen (PERCOCET/ROXICET) 5-325 MG tablet Take 1 tablet  by mouth every 6 (six) hours as needed for moderate pain. 08/17/19   Ina Homes, MD  pantoprazole (PROTONIX) 40 MG tablet Take 1 tablet (40 mg total) by mouth 2 (two) times daily. 05/02/17 08/04/28  Elgergawy, Silver Huguenin, MD  Polyethyl Glycol-Propyl Glycol (SYSTANE) 0.4-0.3 % GEL ophthalmic gel Place 1 application into both eyes daily as needed (dryness).    [provider]  potassium chloride (K-DUR) 10 MEQ tablet Take 10 mEq by mouth daily.    [provider]  prednisoLONE acetate (PRED FORTE) 1 % ophthalmic suspension Place 1 drop into the left eye 2 (two) times daily.    [provider]  promethazine (PHENERGAN) 25 MG suppository Place 1 suppository (25 mg total) rectally every 6 (six) hours as needed for nausea or vomiting. 08/05/19   Charlann Lange, PA-C  promethazine (PHENERGAN) 25 MG tablet Take 25 mg by mouth every 6 (six) hours  as needed for nausea or vomiting.    [provider]  SUMAtriptan (IMITREX) 25 MG tablet Take 25 mg by mouth every 2 (two) hours as needed for migraine. May repeat in 2 hours if headache persists or recurs.    [provider]  topiramate (TOPAMAX) 100 MG tablet Take 100 mg by mouth daily. 06/18/19   [provider]  topiramate (TOPAMAX) 25 MG tablet Take 25 mg by mouth at bedtime.     [provider]  traZODone (DESYREL) 150 MG tablet Take 150 mg by mouth at bedtime as needed for sleep.    [provider]  TUBERCULIN SYR 1CC/27GX1/2" (B-D TB SYRINGE 1CC/27GX1/2") 27G X 1/2" 1 ML MISC 12 Syringes by Does not apply route once a week. 10/30/19   Bo Merino, MD    Allergies    Other, Sulfa antibiotics, Sulfonamide derivatives, and Benadryl [diphenhydramine hcl]  Review of Systems   Review of Systems  Constitutional: Positive for fatigue. Negative for chills and fever.  HENT: Negative for congestion.   Eyes: Negative for pain.  Respiratory: Negative for cough and shortness of breath.     Cardiovascular: Negative for chest pain and leg swelling.  Gastrointestinal: Positive for abdominal pain and vomiting.  Genitourinary: Negative for dysuria and vaginal bleeding.  Musculoskeletal: Negative for myalgias.  Skin: Negative for rash.  Neurological: Negative for dizziness and headaches.    Physical Exam Updated Vital Signs BP 97/65   Pulse 65   Temp 99.8 F (37.7 C) (Rectal)   Resp (!) 22   Ht 5\' 4"  (1.626 m)   Wt 83.9 kg   SpO2 99%   BMI 31.76 kg/m   Physical Exam Vitals and nursing note reviewed.  Constitutional:      General: She is not in acute distress.    Appearance: She is obese.  HENT:     Head: Normocephalic and atraumatic.     Nose: Nose normal.  Eyes:     General: No scleral icterus. Cardiovascular:     Rate and Rhythm: Normal rate and regular rhythm.     Pulses: Normal pulses.     Heart sounds: Normal heart sounds.  Pulmonary:     Effort: Pulmonary effort is normal. No respiratory distress.     Breath sounds: No wheezing.  Abdominal:     Palpations: Abdomen is soft.     Tenderness: There is no abdominal tenderness. There is no right CVA tenderness, left CVA tenderness or guarding.     Hernia: No hernia is present.     Comments: No significant tenderness to palpation of abdomen.  Musculoskeletal:     Cervical back: Normal range of motion.     Right lower leg: No edema.     Left lower leg: No edema.  Skin:    General: Skin is warm and dry.     Capillary Refill: Capillary refill takes less than 2 seconds.  Neurological:     Mental Status: She is alert. Mental status is at baseline.  Psychiatric:        Mood and Affect: Mood normal.        Behavior: Behavior normal.     ED Results / Procedures / Treatments   Labs (all labs ordered are listed, but only abnormal results are displayed) Labs Reviewed  COMPREHENSIVE METABOLIC PANEL - Abnormal; Notable for the following components:      Result Value   Chloride 115 (*)    CO2 21 (*)     Glucose,  Bld 110 (*)    BUN <5 (*)    All other components within normal limits  C DIFFICILE QUICK SCREEN W PCR REFLEX  LIPASE, BLOOD  CBC  URINALYSIS, ROUTINE W REFLEX MICROSCOPIC    EKG None  Radiology No results found.  Procedures Procedures (including critical care time)  Medications Ordered in ED Medications  ondansetron (ZOFRAN-ODT) disintegrating tablet 4 mg (4 mg Oral Given 01/26/20 1614)  sodium chloride 0.9 % bolus 1,000 mL (0 mLs Intravenous Stopped 01/26/20 1948)  metoCLOPramide (REGLAN) injection 10 mg (10 mg Intravenous Given 01/26/20 1846)  morphine 4 MG/ML injection 4 mg (4 mg Intravenous Given 01/26/20 1846)  famotidine (PEPCID) IVPB 20 mg premix (0 mg Intravenous Stopped 01/26/20 1916)  dicyclomine (BENTYL) capsule 20 mg (20 mg Oral Given 01/26/20 2000)  lidocaine (XYLOCAINE) 2 % viscous mouth solution 15 mL (15 mLs Mouth/Throat Given 01/26/20 2000)    ED Course  I have reviewed the triage vital signs and the nursing notes.  Pertinent labs & imaging results that were available during my care of the patient were reviewed by me and considered in my medical decision making (see chart for details).    MDM Rules/Calculators/A&P                      Is a 44 year old female with a significant past medical history rheumatologic diseases, gastroparesis, esophagitis.  She has recently been placed on fluconazole for candidal esophagitis and over the past several days has not been able to tolerate p.o. due to nausea and vomiting.  She is also has some diarrhea.  She has benign abdominal exam and vitals within normal limits rectal temperature is not elevated.  She is afebrile.  She has nausea vomiting and diarrhea I suspect that this is related to her chronic gastroparesis or potentially viral gastroenteritis.  Doubt appendicitis or cholecystitis/biliary etiology as she has had these organs removed already.  She also has both ovaries removed making torsion unlikely or cyst  unlikely.  She does have a history of C. difficile colitis and for this reason I will obtain C. difficile panel.  However she is significantly improved with Pepcid, 1 L normal saline, Bentyl, morphine, Reglan and is able to tolerate p.o. has not vomited at all during ED stay and is now able to take her medications which was her primary concern.  No indication for CT imaging at this time.  Her abdominal exam on repeat examination is benign with no focal tenderness and no changes and overall improvement.  Discharge home she has Zofran at home as well as medications that she is prescribed for her symptoms.  She will follow-up with gastroenterology she is seen by them regularly.  She states she is agreeable with plan to discharge this time.  I recommended that she use Zofran ATC for the next 2 days in order to ensure that she is able to tolerate her medications and does not became p.o. intolerable again.  C. Difficile panel pending at this time.  I discussed with microbiology lab who states that they will contact MD/PA to have antibiotics prescribed to patient if this comes back positive.  The medical records were personally reviewed by myself. I personally reviewed all lab results and interpreted all imaging studies and either concurred with their official read or contacted radiology for clarification.   This patient appears reasonably screened and I doubt any other medical condition requiring further workup, evaluation, or treatment in the ED at this time  prior to discharge.   Patient's vitals are WNL apart from vital sign abnormalities discussed above, patient is in NAD, and able to ambulate in the ED at their baseline and able to tolerate PO.  Pain has been managed or a plan has been made for home management and has no complaints prior to discharge. Patient is comfortable with above plan and for discharge at this time. All questions were answered prior to disposition. Results from the ER workup discussed  with the patient face to face and all questions answered to the best of my ability. The patient is safe for discharge with strict return precautions. Patient appears safe for discharge with appropriate follow-up. Conveyed my impression with the patient and they voiced understanding and are agreeable to plan.   An After Visit Summary was printed and given to the patient.  Portions of this note were generated with [employer]. Dictation errors may occur despite best attempts at proofreading.    [name] Horn was evaluated in Emergency Department on 01/26/2020 for the symptoms described in the history of present illness. She was evaluated in the context of the global COVID-19 pandemic, which necessitated consideration that the patient might be at risk for infection with the SARS-CoV-2 virus that causes COVID-19. Institutional protocols and algorithms that pertain to the evaluation of patients at risk for COVID-19 are in a state of rapid change based on information released by regulatory bodies including the CDC and federal and state organizations. These policies and algorithms were followed during the patient's care in the ED.  Final Clinical Impression(s) / ED Diagnoses Final diagnoses:  Nausea and vomiting, intractability of vomiting not specified, unspecified vomiting type  Gastroparesis  Diarrhea, unspecified type    Rx / DC Orders ED Discharge Orders    None       Tedd Sias, [place] 01/26/20 2132    Milton Ferguson, MD 01/27/20 [phone]

## 2020-01-26 NOTE — ED Triage Notes (Signed)
Patient reports last week having severe thrush and started on medications. Friday started having diarrhea, took imodium w/o relief. Unable to keep down medications due to vomiting. C/o abdominal cramping.

## 2020-01-27 LAB — C DIFFICILE QUICK SCREEN W PCR REFLEX
C Diff antigen: POSITIVE — AB
C Diff toxin: NEGATIVE

## 2020-01-27 LAB — CLOSTRIDIUM DIFFICILE BY PCR, REFLEXED: Toxigenic C. Difficile by PCR: NEGATIVE

## 2020-02-25 NOTE — Progress Notes (Signed)
Office Visit Note  Patient: [name] Horn             Date of Birth: 1976-10-20           MRN: [id number]             PCP: Burnadette Peter, MD Referring: Burnadette Peter, MD Visit Date: 03/03/2020 Occupation: @GUAROCC @  Subjective:  Oral thrush  History of Present Illness: [name] Horn is a 44 y.o. female with a past medical history of seropositive rheumatoid arthritis, keratoconjunctivitis sicca, and osteoarthritis. Since her last visit, she has had difficulty with recurrent oral thrush. Her first episode was on January 26th that prompted her to go to urgent care. At this time she was started on Fluconazole for 10 days. She stopped her Methotrexate at this time as well as her Flovent inhaler for eosinophilic esophagitis. Her oral thrush improved. She then restarted her Methotrexate on Februaruy 26th and within the next two days she developed thrush again. She went to urgent care and was started on Fluconazole for two weeks and a Nystatin rinse. She states that the oral thrush has caused significant discomfort for her. She has noticed an improvement since starting these medications.   Since she has been off of her Methotrexate, she has noticed increased bilateral hand pain and swelling. She has also experienced cervical neck pain, bilateral shoulder pain, bilateral hip pain, and stiffness in her bilateral feet with accompanying swelling. She has noticed increased difficulty with opening jars and performing small movements with her hands. She continues to have difficulty with right carpal tunnel syndrome and tries to wear a brace as often as she can. She will also apply ice and heat as needed. She states that this helps to alleviate the pain. She states that she has noticed an improvement in the frequency of her raynaud's as she is staying home more often and not working right now. However, she states that her hands have become more sensitive to cold objects as she can reach for  something in the refrigerator and this will cause her fingers to turn white. She states that her eye inflammation has improved since starting Methotrexate. She endorses persistent generalized muscle pain and fatigue as well as chronic insomnia that is unchanged.   Activities of Daily Living:  Patient reports morning stiffness for 30-45 minutes.   Patient Reports nocturnal pain.  Difficulty dressing/grooming: Denies Difficulty climbing stairs: Reports Difficulty getting out of chair: Denies Difficulty using hands for taps, buttons, cutlery, and/or writing: Reports  Review of Systems  Constitutional: Positive for fatigue. Negative for night sweats, weight gain and weight loss.  HENT: Positive for mouth dryness. Negative for mouth sores, trouble swallowing, trouble swallowing and nose dryness.   Eyes: Positive for dryness. Negative for pain, redness and visual disturbance.  Respiratory: Negative for cough, shortness of breath and difficulty breathing.   Cardiovascular: Negative for chest pain, palpitations, hypertension, irregular heartbeat and swelling in legs/feet.  Gastrointestinal: Negative for blood in stool, constipation and diarrhea.  Endocrine: Negative for increased urination.  Genitourinary: Negative for difficulty urinating, painful urination and vaginal dryness.  Musculoskeletal: Positive for arthralgias, joint pain, joint swelling and morning stiffness. Negative for myalgias, muscle weakness, muscle tenderness and myalgias.  Skin: Negative for color change, rash, hair loss, skin tightness, ulcers and sensitivity to sunlight.  Allergic/Immunologic: Negative for susceptible to infections.  Neurological: Negative for dizziness, headaches, memory loss, night sweats and weakness.  Hematological: Negative for bruising/bleeding tendency and swollen glands.  Psychiatric/Behavioral: Negative for depressed mood, confusion and sleep disturbance. The patient is not nervous/anxious.     PMFS  History:  Patient Active Problem List   Diagnosis Date Noted  . Colitis 08/14/2019  . Constipation   . Gastroparesis 08/07/2019  . Nausea and vomiting 04/26/2017  . Nausea & vomiting 04/25/2017  . Chest pain 04/25/2017  . ANKLE, PAIN 11/24/2010  . ANXIETY 10/11/2010  . DVT 10/11/2010  . COSTOCHONDRITIS 03/03/2008  . CELLULITIS, GREAT TOE 12/01/2007  . SINUSITIS, ACUTE NOS 09/25/2007  . MENOPAUSE, PREMATURE 07/20/2007  . UTERINE CANCER, HX OF 07/16/2007  . Other postprocedural status(V45.89) 07/16/2007    Past Medical History:  Diagnosis Date  . Complication of anesthesia    " i TAKE A LIITLE BIT LONGER TO [place] UP "  . DVT (deep venous thrombosis) ([place])    x2  . Gastroparesis 08/07/2019  . Lymphedema   . RA (rheumatoid arthritis) ([place])   . Uterine cancer ([place])     Family History  Problem Relation Age of Onset  . Hypertension Other   . Hyperlipidemia Other   . Colon cancer Other        grandmother  . Hashimoto's thyroiditis Mother   . Throat cancer Father   . Arrhythmia Father    Past Surgical History:  Procedure Laterality Date  . APPENDECTOMY    . CHOLECYSTECTOMY    . ESOPHAGOGASTRODUODENOSCOPY N/A 04/30/2017   Procedure: ESOPHAGOGASTRODUODENOSCOPY (EGD);  Surgeon: Teena Irani, MD;  Location: [facility] ENDOSCOPY;  Service: Endoscopy;  Laterality: N/A;  . HERNIA REPAIR     x2  . KNEE SURGERY     x3  . MINIMALLY INVASIVE FORAMINOTOMY CERVICAL SPINE     C6-T1, Nitka  . SAVORY DILATION N/A 04/30/2017   Procedure: SAVORY DILATION;  Surgeon: Teena Irani, MD;  Location: [facility] ENDOSCOPY;  Service: Endoscopy;  Laterality: N/A;  . TONSILLECTOMY    . TOTAL ABDOMINAL HYSTERECTOMY     Sarcoma, s/p XRT   Social History   Social History Narrative  . Not on file   Immunization History  Administered Date(s) Administered  . Influenza,inj,Quad PF,6+ Mos 11/21/2017, 09/10/2018  . Influenza-Unspecified 10/22/2014, 10/23/2015     Objective: Vital Signs: BP 112/73 (BP Location: Left  Arm, Patient Position: Sitting, Cuff Size: Normal)   Pulse 73   Resp 14   Ht 5\' 4"  (1.626 m)   Wt 187 lb 3.2 oz (84.9 kg)   BMI 32.13 kg/m    Physical Exam Constitutional:      General: She is not in acute distress.    Appearance: Normal appearance.  HENT:     Head: Normocephalic and atraumatic.  Eyes:     Conjunctiva/sclera: Conjunctivae normal.  Cardiovascular:     Rate and Rhythm: Normal rate.  Pulmonary:     Effort: Pulmonary effort is normal.  Abdominal:     Palpations: Abdomen is soft.  Musculoskeletal:        General: Normal range of motion.     Cervical back: Normal range of motion.     Right lower leg: No edema.     Left lower leg: No edema.  Skin:    General: Skin is warm and dry.  Neurological:     Mental Status: She is alert and oriented to person, place, and time.  Psychiatric:        Behavior: Behavior normal.    Musculoskeletal Exam: Cervical spine, thoracic spine, and lumbar spine with good ROM and no discomfort. No midline spinal tenderness or  SI joint tenderness. Shoulder joints, elbow joints, wrist joints, MCPs, PIPs, and DIPs with good ROM and no synovitis. Tenderness to palpation of the 2nd and 3rd MCP joints and 3rd PIP of right hand. She has tenderness of the  3rd MCP on her left hand. Complete fist formation bilaterally. Hip joints, knee joints, ankle joints, MTPs, and PIPs with good ROM. No warmth or effusion of bilateral knees. No tenderness or edema of bilateral ankle joints. Bilateral tenderness of trochanteric bursas.    CDAI Exam: CDAI Score: 5.2  Patient Global: 6 mm; Provider Global: 6 mm Swollen: 0 ; Tender: 4  Joint Exam 03/03/2020      Right  Left  MCP 2   Tender     MCP 3   Tender   Tender  PIP 3   Tender        Investigation: No additional findings.  Imaging: No results found.  Recent Labs: Lab Results  Component Value Date   WBC 6.7 01/26/2020   HGB 13.3 01/26/2020   PLT 301 01/26/2020   NA 144 01/26/2020   K 4.0  01/26/2020   CL 115 (H) 01/26/2020   CO2 21 (L) 01/26/2020   GLUCOSE 110 (H) 01/26/2020   BUN <5 (L) 01/26/2020   CREATININE 0.78 01/26/2020   BILITOT 0.7 01/26/2020   ALKPHOS 71 01/26/2020   AST 22 01/26/2020   ALT 29 01/26/2020   PROT 6.7 01/26/2020   ALBUMIN 4.1 01/26/2020   CALCIUM 9.5 01/26/2020   GFRAA >60 01/26/2020   QFTBGOLDPLUS NEGATIVE 10/23/2019    Speciality Comments: No specialty comments available.  Procedures:  No procedures performed Allergies: Other, Sulfa antibiotics, Sulfonamide derivatives, and Benadryl [diphenhydramine hcl]   Assessment / Plan:     Visit Diagnoses: Rheumatoid arthritis involving multiple sites with positive rheumatoid factor ([place]) - She has been experiencing recurrent oral thrush and has been unable to take her Methotrexate as prescribed. She has had two episodes of oral thrush since January. Her last dose of Methotrexate was on February 26th and that weekend she began having symptoms of oral thrush again. She is currently on a two week course of Fluconazole and a Nystatin rinse. Since stopping her Methotrexate, she has noticed increased pain in her cervical spine, bilateral shoulders, bilateral hips, and bilateral feet. She has also noticed intermittent swelling in her bilateral wrists, bilateral hands, and bilateral feet. She endorses morning stiffness for 30-45 minutes as well. Shoulder joints, elbow joints, wrist joints, MCPs, PIPs, and DIPs with good ROM and no synovitis. Tenderness to palpation of the 2nd and 3rd MCP joints and 3rd PIP of the right hand. She also has tenderness of the 3rd MCP of the left hand. We discussed with the patient her current options regarding Methotrexate, possibly switching medications, and her recurrent oral thrush. At this time, she would like to wait for her oral thrush to completely clear and then try restarting Methotrexate 0.8 mL subcutaneous injection once weekly and folic acid 2 mg PO daily. We advised her to  follow up with her PCP regarding oral thrush and to discuss seeing an infectious disease specialist. She is currently up to date on routine lab work and will return in April for lab work again. Follow up in 2 months.  Recurrent thrush-patient is experiencing recurrent thrush most likely related to the Flovent inhaler.  She has stopped using Flovent.  We will see if her symptoms improve.  High risk medication use - MTX 0.8 ml sq once weekly and  folic acid 2 mg po daily. She has been unable to take Methotrexate as prescribed due to recurrent oral thrush. She is currently on a two week course of Fluconazole and a Nystatin rinse to help resolve her symptoms. Once her oral thrush has cleared, we will restart Methotrexate as prescribed. We discussed consulting infectious disease to determine the best course of action if her oral thrush returns again. She is currently up to date on her routine lab work and will follow up in April for lab work again.  Keratoconjunctivitis sicca ([place]) - She was referred by Dr. Katy Fitch for recurrent conjunctivitis and keratoconjunctivitis.  AVISE index -2.4, all labs were negative except RF IgA 39. Since starting Methotrexate, she has noticed an improvement in her eye inflammation. She states that she has not had any recent difficulties with eye inflammation.  Carpal tunnel syndrome of right wrist - She endorses persistent right wrist pain with occasional swelling of her right hand and wrist. She wears a brace often and will apply ice or heat as needed. She states that this does help alleviate the pain. Good ROM present on exam. No synovitis or tenderness noted.   Raynaud's syndrome without gangrene - She states that she has noticed an improvement in the frequency of her raynaud's as she is staying home more often and not working right now. However, she states that her hands have become more sensitive to cold objects as she can reach for something in the refrigerator and this will  cause her fingers to turn white. No signs of gangrene or digital ulcers noticed on examination. Encouraged her to keep her hands warm when able.  Rheumatoid factor positive - Rheumatoid factor IgA 39 on 10/07/19.    Eosinophilic esophagitis - She has taken oral prednisone per gastroenterology in the past. She has discontinued her Flovent inhaler due to recurrent oral thrush.  DDD (degenerative disc disease), cervical - Patient had foraminotomy by Dr. Louanne Skye in the past. She has persistent cervical pain. No tenderness noted on examination. Good ROM present.  Myofascial pain - Generalized muscle aches and pain. Chronic fatigue related to insomnia.  Gastroparesis  History of anxiety  History of Clostridioides difficile colitis  UTERINE CANCER, HX OF  History of shingles  Orders: No orders of the defined types were placed in this encounter.  No orders of the defined types were placed in this encounter.   .  Follow-Up Instructions: Return in about 2 months (around 05/03/2020) for Rheumatoid arthritis.   Bo Merino, MD  Note - This record has been created using Editor, commissioning.  Chart creation errors have been sought, but may not always  have been located. Such creation errors do not reflect on  the standard of medical care.

## 2020-03-03 ENCOUNTER — Ambulatory Visit (INDEPENDENT_AMBULATORY_CARE_PROVIDER_SITE_OTHER): Payer: Managed Care, Other (non HMO) | Admitting: Rheumatology

## 2020-03-03 ENCOUNTER — Encounter: Payer: Self-pay | Admitting: Rheumatology

## 2020-03-03 ENCOUNTER — Other Ambulatory Visit: Payer: Self-pay

## 2020-03-03 VITALS — BP 112/73 | HR 73 | Resp 14 | Ht 64.0 in | Wt 187.2 lb

## 2020-03-03 DIAGNOSIS — G5601 Carpal tunnel syndrome, right upper limb: Secondary | ICD-10-CM

## 2020-03-03 DIAGNOSIS — Z8619 Personal history of other infectious and parasitic diseases: Secondary | ICD-10-CM

## 2020-03-03 DIAGNOSIS — K3184 Gastroparesis: Secondary | ICD-10-CM

## 2020-03-03 DIAGNOSIS — M3501 Sicca syndrome with keratoconjunctivitis: Secondary | ICD-10-CM | POA: Diagnosis not present

## 2020-03-03 DIAGNOSIS — M5030 Other cervical disc degeneration, unspecified cervical region: Secondary | ICD-10-CM

## 2020-03-03 DIAGNOSIS — M0579 Rheumatoid arthritis with rheumatoid factor of multiple sites without organ or systems involvement: Secondary | ICD-10-CM

## 2020-03-03 DIAGNOSIS — M7918 Myalgia, other site: Secondary | ICD-10-CM

## 2020-03-03 DIAGNOSIS — Z8659 Personal history of other mental and behavioral disorders: Secondary | ICD-10-CM

## 2020-03-03 DIAGNOSIS — Z79899 Other long term (current) drug therapy: Secondary | ICD-10-CM

## 2020-03-03 DIAGNOSIS — R768 Other specified abnormal immunological findings in serum: Secondary | ICD-10-CM

## 2020-03-03 DIAGNOSIS — Z8542 Personal history of malignant neoplasm of other parts of uterus: Secondary | ICD-10-CM

## 2020-03-03 DIAGNOSIS — I7300 Raynaud's syndrome without gangrene: Secondary | ICD-10-CM

## 2020-03-03 DIAGNOSIS — B370 Candidal stomatitis: Secondary | ICD-10-CM

## 2020-03-03 DIAGNOSIS — K200 Eosinophilic esophagitis: Secondary | ICD-10-CM

## 2020-03-03 NOTE — Patient Instructions (Signed)
Standing Labs We placed an order today for your standing lab work.    Please come back and get your standing labs in April and every 3 months   We have open lab daily Monday through Thursday from 8:30-12:30 PM and 1:30-4:30 PM and Friday from 8:30-12:30 PM and 1:30-4:00 PM at the office of Dr. Marria Mathison.   You may experience shorter wait times on Monday and Friday afternoons. The office is located at [address], [address], [place],  [address] No appointment is necessary.   Labs are drawn by [facility].  You may receive a bill from [facility] for your lab work.  If you wish to have your labs drawn at another location, please call the office 24 hours in advance to send orders.  If you have any questions regarding directions or hours of operation,  please call [phone].   Just as a reminder please drink plenty of water prior to coming for your lab work. Thanks!   

## 2020-03-31 ENCOUNTER — Other Ambulatory Visit: Payer: Self-pay

## 2020-03-31 DIAGNOSIS — Z79899 Other long term (current) drug therapy: Secondary | ICD-10-CM

## 2020-03-31 LAB — CBC WITH DIFFERENTIAL/PLATELET
Absolute Monocytes: 393 cells/uL (ref 200–950)
Basophils Absolute: 41 cells/uL (ref 0–200)
Basophils Relative: 0.6 %
Eosinophils Absolute: 200 cells/uL (ref 15–500)
Eosinophils Relative: 2.9 %
HCT: 37.7 % (ref 35.0–45.0)
Hemoglobin: 12.3 g/dL (ref 11.7–15.5)
Lymphs Abs: 2519 cells/uL (ref 850–3900)
MCH: 31.1 pg (ref 27.0–33.0)
MCHC: 32.6 g/dL (ref 32.0–36.0)
MCV: 95.4 fL (ref 80.0–100.0)
MPV: 9.7 fL (ref 7.5–12.5)
Monocytes Relative: 5.7 %
Neutro Abs: 3747 cells/uL (ref 1500–7800)
Neutrophils Relative %: 54.3 %
Platelets: 310 10*3/uL (ref 140–400)
RBC: 3.95 10*6/uL (ref 3.80–5.10)
RDW: 13.1 % (ref 11.0–15.0)
Total Lymphocyte: 36.5 %
WBC: 6.9 10*3/uL (ref 3.8–10.8)

## 2020-03-31 LAB — COMPLETE METABOLIC PANEL WITH GFR
AG Ratio: 1.8 (calc) (ref 1.0–2.5)
ALT: 22 U/L (ref 6–29)
AST: 19 U/L (ref 10–35)
Albumin: 4.2 g/dL (ref 3.6–5.1)
Alkaline phosphatase (APISO): 72 U/L (ref 31–125)
BUN: 11 mg/dL (ref 7–25)
CO2: 26 mmol/L (ref 20–32)
Calcium: 9.7 mg/dL (ref 8.6–10.2)
Chloride: 107 mmol/L (ref 98–110)
Creat: 1 mg/dL (ref 0.50–1.10)
GFR, Est African American: 79 mL/min/{1.73_m2} (ref >=60)
GFR, Est Non African American: 68 mL/min/{1.73_m2} (ref >=60)
Globulin: 2.3 g/dL (calc) (ref 1.9–3.7)
Glucose, Bld: 102 mg/dL — ABNORMAL HIGH (ref 65–99)
Potassium: 4.3 mmol/L (ref 3.5–5.3)
Sodium: 141 mmol/L (ref 135–146)
Total Bilirubin: 0.3 mg/dL (ref 0.2–1.2)
Total Protein: 6.5 g/dL (ref 6.1–8.1)

## 2020-04-01 ENCOUNTER — Telehealth: Payer: Self-pay | Admitting: Rheumatology

## 2020-04-01 DIAGNOSIS — B370 Candidal stomatitis: Secondary | ICD-10-CM

## 2020-04-01 NOTE — Progress Notes (Signed)
CBC and CMP are normal.

## 2020-04-01 NOTE — Telephone Encounter (Signed)
Patient called stating Dr. Estanislado Pandy recommended that she schedule an appointment with an infectious disease doctor.  Patient states she called [facility] in [place] and they require a referral.  Patient requested a return call.

## 2020-04-02 ENCOUNTER — Encounter: Payer: Self-pay | Admitting: Allergy

## 2020-04-02 ENCOUNTER — Other Ambulatory Visit: Payer: Self-pay

## 2020-04-02 ENCOUNTER — Ambulatory Visit (INDEPENDENT_AMBULATORY_CARE_PROVIDER_SITE_OTHER): Payer: Managed Care, Other (non HMO) | Admitting: Otolaryngology

## 2020-04-02 ENCOUNTER — Encounter (INDEPENDENT_AMBULATORY_CARE_PROVIDER_SITE_OTHER): Payer: Self-pay | Admitting: Otolaryngology

## 2020-04-02 ENCOUNTER — Ambulatory Visit (INDEPENDENT_AMBULATORY_CARE_PROVIDER_SITE_OTHER): Payer: Managed Care, Other (non HMO) | Admitting: Allergy

## 2020-04-02 VITALS — BP 114/76 | HR 84 | Temp 97.6°F | Resp 18 | Ht 64.17 in | Wt 183.5 lb

## 2020-04-02 VITALS — Temp 98.1°F

## 2020-04-02 DIAGNOSIS — R490 Dysphonia: Secondary | ICD-10-CM | POA: Diagnosis not present

## 2020-04-02 DIAGNOSIS — Z130 Encounter for screening for diseases of the blood and blood-forming organs and certain disorders involving the immune mechanism: Secondary | ICD-10-CM | POA: Diagnosis not present

## 2020-04-02 DIAGNOSIS — Z8619 Personal history of other infectious and parasitic diseases: Secondary | ICD-10-CM

## 2020-04-02 DIAGNOSIS — Z889 Allergy status to unspecified drugs, medicaments and biological substances status: Secondary | ICD-10-CM

## 2020-04-02 DIAGNOSIS — Z8709 Personal history of other diseases of the respiratory system: Secondary | ICD-10-CM | POA: Diagnosis not present

## 2020-04-02 DIAGNOSIS — B370 Candidal stomatitis: Secondary | ICD-10-CM | POA: Diagnosis not present

## 2020-04-02 DIAGNOSIS — K200 Eosinophilic esophagitis: Secondary | ICD-10-CM

## 2020-04-02 DIAGNOSIS — Z9109 Other allergy status, other than to drugs and biological substances: Secondary | ICD-10-CM

## 2020-04-02 NOTE — Patient Instructions (Addendum)
.   Get bloodwork:  o We are ordering labs, so please allow 1-2 weeks for the results to come back. o With the newly implemented Cures Act, the labs might be visible to you at the same time that they become visible to me. However, I will not address the results until all of the results are back, so please be patient.  o   Keep track of infections.  Follow up in 3 weeks or sooner if needed Follow up with ENT and GI

## 2020-04-02 NOTE — Telephone Encounter (Signed)
Okay to refer? 

## 2020-04-02 NOTE — Telephone Encounter (Signed)
Patient advised referral has been placed for  Infectious Disease.

## 2020-04-02 NOTE — Progress Notes (Signed)
New Patient Note  RE: [name] Horn MRN: [id number] DOB: Feb 24, 1976 Date of Office Visit: 04/02/2020  Referring provider: Riki Sheer, NP Primary care provider: Alan Ripper, [place]  Chief Complaint: Frequent Infections [name]  History of Present Illness: I had the pleasure of seeing [name] Horn for initial evaluation at the [facility] [place] [facility] [place] on 04/03/2020. She is a 44 y.o. female, who is referred here by Andy Gauss for the evaluation of frequent oral thrush.  Patient has bene having issues with frequent oral thrush/mouth ulcers since January 2021. No prior episodes of thrush/ulcers before this.  She was recently diagnosed with RA and was started on methotrexate in November 2020.   She also has eosinophilic esophagitis and was on swallowed Flovent for 3 years. She did not have any issues with thrush all those years until January.  She stopped the Flovent in late January but she had another episode at the end of January/February.  She had a week course of fluconazole tablet and lidocaine which helped.  She had another episode of thrush in February/March. Treated with a higher dose of fluconazole and nystatin wash which helped.  Last week she had another bout of thrush.  She does got some mild fevers when the thrush flares up.   Patient is going to ENT this afternoon for evaluation as well.   She was seen by GI as well and they are the ones who started the initial fluconazole treatment. She did not have an EGD during these episodes. She has had dilatations in the past for her EoE. She has tried various dietary changes due to her eoe, gastroparesis, and reflux.   She also noted some voice changes since the ongoing thrush.   She saw her rheumatologist and has been skipping methotrexate during these oral thrush flares.   Patient has history multiple infections including strep throat s/p tonsillectomy, pneumonia x 1, ear infection x 1, GI  infections/diarrhea - C diff x 3, tooth abscess. Patient also has history of opportunistic infections including fungal infections - oral thrush, viral infections - fever blisters, shingles.    Patient labs show immunoglobulin levels 10/23/2019 - IgG, IgA IgM unremarkable. Patient reports 2 antibiotic use in the last 12 month. 3 hospital admissions for C diff in the past.  Patient does not have any history of chronic steroid use, diabetes mellitus, protein losing enteropathy, renal or hepatic dysfunction, history of HIV, hepatitis B or C. 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 Patient is on methotrexate for RA. She has poor dentition and had multiple teeth extractions.  No family history of immunodeficiency. Patient is not a product of a consanguineous relationship.  Assessment and Plan: [name] Horn is a 44 y.o. female with: Encounter for screening for diseases of the blood and blood-forming organs and certain disorders involving the immune mechanism In January 2021 started to get frequent bouts of oral thrush/mouth ulcers. Initially thought to be due to her swallowed Flovent used for EoE. However, she stopped using Flovent in January and had multiple reoccurrences since then. Treated with oral fluconazole and nystatin wash which help while on the medication. Recently diagnosed with RA and started on methotrexate. Having some episodes of fevers with these thrush. History of C diff x 3, shingles, pneumonia x 1, strep throat s/p tonsillectomy, poor dentitions. 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  Normal immunoglobulin levels in 2020.  Discussed at length with the patient that she has a complicated medical history and will order some basic immune evaluation bloodwork. Given her history there is some concern if her immunosuppressants are contributing to theses issues versus if she has some type of  underlying immunodeficiency.   Get bloodwork and depending on results will make further recommendations.  Keep track of infections.   History of asthma History of EIB as a teenager. Stable now.  Monitor symptoms.   Environmental allergies Mild rhino conjunctivitis symptoms in the fall.  Monitor symptoms. Consider testing in future.   Eosinophilic esophagitis Diagnosed with EoE meany years ago and was on Flovent for 3 years. Patient had dilatations in the past.  Follow up with GI.  Consider food testing in future.   Return in about 3 weeks (around 04/23/2020) for Skin testing.  Keep ENT appointment as scheduled.   Lab Orders     IgG 1, 2, 3, and 4     IgG, IgA, IgM     Lymph Enumeration, Basic & NK Cells     Strep pneumoniae 23 Serotypes IgG     HIV Antibody (routine testing w rflx)     Complement, total     IgE     Diphtheria / Tetanus Antibody Panel     HBV/HCV (Profile VIII)  Other allergy screening: Asthma: no  Exercise induced asthma as a teenager.  Rhino conjunctivitis: yes  Some coughing and itchy eyes in the fall.  Food allergy: no Medication allergy: yes  Hymenoptera allergy: no Urticaria: no Eczema:no  Diagnostics: None.  Past Medical History: Patient Active Problem List   Diagnosis Date Noted  . Eosinophilic esophagitis [id number]  . Encounter for screening for diseases of the blood and blood-forming organs and certain disorders involving the immune mechanism 04/02/2020  . Oral candidiasis 04/02/2020  . History of Clostridium difficile infection 04/02/2020  . History of shingles 04/02/2020  . History of asthma 04/02/2020  . Multiple drug allergies 04/02/2020  . Environmental allergies 04/02/2020  . Colitis 08/14/2019  . Constipation   . Gastroparesis 08/07/2019  . Nausea and vomiting 04/26/2017  . Nausea & vomiting 04/25/2017  . Chest pain 04/25/2017  . ANKLE, PAIN 11/24/2010  . ANXIETY 10/11/2010  . DVT 10/11/2010  . COSTOCHONDRITIS  03/03/2008  . CELLULITIS, GREAT TOE 12/01/2007  . SINUSITIS, ACUTE NOS 09/25/2007  . MENOPAUSE, PREMATURE 07/20/2007  . UTERINE CANCER, HX OF 07/16/2007  . Other postprocedural status(V45.89) 07/16/2007   Past Medical History:  Diagnosis Date  . Complication of anesthesia    " i TAKE A LIITLE BIT LONGER TO [place] UP "  . DVT (deep venous thrombosis) ([place])    x2  . Gastroparesis 08/07/2019  . Lymphedema   . RA (rheumatoid arthritis) ([place])   . Uterine cancer [facility])    Past Surgical History: Past Surgical History:  Procedure Laterality Date  . APPENDECTOMY    . CHOLECYSTECTOMY    . ESOPHAGOGASTRODUODENOSCOPY N/A 04/30/2017   Procedure: ESOPHAGOGASTRODUODENOSCOPY (EGD);  Surgeon: Teena Irani, MD;  Location: [facility] ENDOSCOPY;  Service: Endoscopy;  Laterality: N/A;  . HERNIA REPAIR     x2  . KNEE SURGERY     x3  . MINIMALLY INVASIVE FORAMINOTOMY CERVICAL SPINE     C6-T1, N[place]ka  . SAVORY DILATION N/A 04/30/2017   Procedure: SAVORY DILATION;  Surgeon: Teena Irani, MD;  Location: [facility] ENDOSCOPY;  Service: Endoscopy;  Laterality: N/A;  . TONSILLECTOMY    . TOTAL ABDOMINAL HYSTERECTOMY     Sarcoma, s/p  XRT   Medication List:  Current Outpatient Medications  Medication Sig Dispense Refill  . acyclovir (ZOVIRAX) 400 MG tablet Take 400 mg by mouth 3 (three) times daily.    Marland Kitchen amphetamine-dextroamphetamine (ADDERALL) 20 MG tablet Take 20 mg by mouth 3 (three) times daily.    Marland Kitchen atropine 1 % ophthalmic solution Place 1 drop into the left eye daily as needed (for dry eye).    . baclofen (LIORESAL) 10 MG tablet Take 10 mg by mouth daily.    . Bepotastine Besilate (BEPREVE) 1.5 % SOLN Place 1 drop into both eyes 2 (two) times a day.    . cycloSPORINE (RESTASIS) 0.05 % ophthalmic emulsion Place 1 drop into both eyes 2 (two) times daily.    Marland Kitchen dicyclomine (BENTYL) 10 MG capsule Take 10 mg by mouth daily as needed for nausea/vomiting.    . fluconazole (DIFLUCAN) 200 MG tablet     . folic acid (FOLVITE) 1  MG tablet Take 2 tablets (2 mg total) by mouth daily. 180 tablet 3  . furosemide (LASIX) 20 MG tablet Take 20 mg by mouth daily as needed.     Marland Kitchen LORazepam (ATIVAN) 1 MG tablet Take 1 mg by mouth 3 (three) times daily as needed.    . methotrexate 50 MG/2ML injection Inject 0.15mL into the skin once weekly. 10 mL 0  . metoCLOPramide (REGLAN) 10 MG tablet Take 10 mg by mouth daily.     Marland Kitchen nystatin (MYCOSTATIN) 100000 UNIT/ML suspension Take 10 mLs by mouth 3 (three) times daily.    . ondansetron (ZOFRAN-ODT) 4 MG disintegrating tablet Take 1 tablet (4 mg total) by mouth every 8 (eight) hours as needed for nausea or vomiting. 15 tablet 0  . oxyCODONE-acetaminophen (PERCOCET) 7.5-325 MG tablet     . pantoprazole (PROTONIX) 40 MG tablet Take 1 tablet (40 mg total) by mouth 2 (two) times daily. 60 tablet 1  . Polyethyl Glycol-Propyl Glycol (SYSTANE) 0.4-0.3 % GEL ophthalmic gel Place 1 application into both eyes daily as needed (dryness).    . potassium chloride (K-DUR) 10 MEQ tablet Take 10 mEq by mouth daily as needed.     . prednisoLONE acetate (PRED FORTE) 1 % ophthalmic suspension Place 1 drop into the left eye 2 (two) times daily.    . promethazine (PHENERGAN) 25 MG suppository Place 1 suppository (25 mg total) rectally every 6 (six) hours as needed for nausea or vomiting. 12 each 0  . promethazine (PHENERGAN) 25 MG tablet Take 25 mg by mouth every 6 (six) hours as needed for nausea or vomiting.    . SUMAtriptan (IMITREX) 25 MG tablet Take 25 mg by mouth every 2 (two) hours as needed for migraine. May repeat in 2 hours if headache persists or recurs.    . topiramate (TOPAMAX) 100 MG tablet Take 100 mg by mouth daily.    Marland Kitchen topiramate (TOPAMAX) 25 MG tablet Take 25 mg by mouth at bedtime.     . traZODone (DESYREL) 150 MG tablet Take 150 mg by mouth at bedtime as needed for sleep.    . TUBERCULIN SYR 1CC/27GX1/2" (B-D TB SYRINGE 1CC/27GX1/2") 27G X 1/2" 1 ML MISC 12 Syringes by Does not apply route once  a week. 12 each 3  . Vitamin D, Ergocalciferol, (DRISDOL) 1.25 MG (50000 UNIT) CAPS capsule Take 50,000 Units by mouth once a week.    . budesonide (PULMICORT) 0.25 MG/2ML nebulizer solution Swallow 34ml po BID.  Rinse and spit after.  No food or drink  for 30 minutes after administration.    . fluticasone (FLOVENT HFA) 220 MCG/ACT inhaler Inhale 2 puffs into the lungs 2 (two) times daily.    . hyoscyamine (LEVSIN, ANASPAZ) 0.125 MG tablet Take 0.125 mg by mouth every 4 (four) hours as needed for cramping.      No current facility-administered medications for this visit.   Allergies: Allergies  Allergen Reactions  . Other     Dermabond surgical glue.   . Sulfa Antibiotics Anaphylaxis, Shortness Of Breath, Swelling and Rash    Angioedema (also)  . Sulfonamide Derivatives Hives, Shortness Of Breath and Swelling    TONGUE SWELLS  . Benadryl [Diphenhydramine Hcl] Other (See Comments)    Hyperactivity    Social History: Social History   Socioeconomic History  . Marital status: Married    Spouse name: Not on file  . Number of children: Not on file  . Years of education: Not on file  . Highest education level: Not on file  Occupational History  . Not on file  Tobacco Use  . Smoking status: Passive Smoke Exposure - Never Smoker  . Smokeless tobacco: Never Used  Substance and Sexual Activity  . Alcohol use: Not Currently  . Drug use: No  . Sexual activity: Not on file  Other Topics Concern  . Not on file  Social History Narrative  . Not on file   Social Determinants of Health   Financial Resource Strain:   . Difficulty of Paying Living Expenses:   Food Insecurity:   . Worried About Charity fundraiser in the Last Year:   . Arboriculturist in the Last Year:   Transportation Needs:   . Film/video editor (Medical):   Marland Kitchen Lack of Transportation (Non-Medical):   Physical Activity:   . Days of Exercise per Week:   . Minutes of Exercise per Session:   Stress:   . Feeling of  Stress :   Social Connections:   . Frequency of Communication with Friends and Family:   . Frequency of Social Gatherings with Friends and Family:   . Attends Religious Services:   . Active Member of Clubs or Organizations:   . Attends Archivist Meetings:   Marland Kitchen Marital Status:    Lives in an old [place]. Smoking: denies, husband smokes mainly outdoors.  Occupation: Data processing manager HistoryFreight forwarder in the house: yes Carpet in the family room: yes Carpet in the bedroom: yes Heating: heat pump Cooling: window Pet: yes cats  Family History: Family History  Problem Relation Age of Onset  . Hypertension Other   . Hyperlipidemia Other   . Colon cancer Other        grandmother  . Hashimoto's thyroiditis Mother   . Eczema Mother   . Angioedema Mother   . Throat cancer Father   . Arrhythmia Father   . Angioedema Maternal Grandfather    Problem                               Relation Immunodeficiency   No  Review of Systems  Constitutional: Positive for fever. Negative for appetite change, chills and unexpected weight change.  HENT: Positive for sore throat and voice change. Negative for congestion and rhinorrhea.   Eyes: Negative for itching.  Respiratory: Negative for cough, chest tightness, shortness of breath and wheezing.   Cardiovascular: Negative for chest pain.  Gastrointestinal: Positive for abdominal pain.  Genitourinary: Negative for difficulty urinating.  Skin: Negative for rash.  Allergic/Immunologic: Negative for food allergies.  Neurological: Negative for headaches.   Objective: BP 114/76 (BP Location: Left Arm, Patient Position: Sitting, Cuff Size: Normal)   Pulse 84   Temp 97.6 F (36.4 C) (Temporal)   Resp 18   Ht 5' 4.17" (1.63 m)   Wt 183 lb 8 oz (83.2 kg)   SpO2 98%   BMI 31.33 kg/m  Body mass index is 31.33 kg/m. Physical Exam  Constitutional: She is oriented to person, place, and time. She appears  well-developed and well-nourished.  HENT:  Head: Normocephalic and atraumatic.  Right Ear: External ear normal.  Left Ear: External ear normal.  Nose: Nose normal.  Multiple upper teeth missing. No oral thrush or ulcers noted.   Eyes: Conjunctivae and EOM are normal.  Cardiovascular: Normal rate, regular rhythm and normal heart sounds. Exam reveals no gallop and no friction rub.  No murmur heard. Pulmonary/Chest: Effort normal and breath sounds normal. She has no wheezes. She has no rales.  Abdominal: Soft.  Musculoskeletal:     Cervical back: Neck supple.  Neurological: She is alert and oriented to person, place, and time.  Skin: Skin is warm. No rash noted.  Psychiatric: She has a normal mood and affect. Her behavior is normal.  Nursing note and vitals reviewed.  The plan was reviewed with the patient/family, and all questions/concerned were addressed.  It was my pleasure to see [name] Horn today and participate in her care. Please feel free to contact me with any questions or concerns.  Sincerely,  Rexene Alberts, DO Allergy & Immunology  [facility] office: [phone] [facility] office: [place] office: [phone]

## 2020-04-02 NOTE — Progress Notes (Signed)
HPI: [name] Horn is a 44 y.o. female who presents is referred by her PCP for evaluation of recurrent thrush.  She apparently started having recurrent thrush in January.  She has been on methotrexate for rheumatoid arthritis.  She also has other he immune issues and has an appointment to see an immunologist.  She has just recently been treated with Diflucan for the past week and is doing much better.  She previously had a lot of hoarseness as well as a sore throat.  Her voice is doing better presently. She does not smoke.Marland Kitchen  Past Medical History:  Diagnosis Date  . Complication of anesthesia    " i TAKE A LIITLE BIT LONGER TO [place] UP "  . DVT (deep venous thrombosis) ([place])    x2  . Gastroparesis 08/07/2019  . Lymphedema   . RA (rheumatoid arthritis) ([place])   . Uterine cancer [facility])    Past Surgical History:  Procedure Laterality Date  . APPENDECTOMY    . CHOLECYSTECTOMY    . ESOPHAGOGASTRODUODENOSCOPY N/A 04/30/2017   Procedure: ESOPHAGOGASTRODUODENOSCOPY (EGD);  Surgeon: Teena Irani, MD;  Location: [facility] ENDOSCOPY;  Service: Endoscopy;  Laterality: N/A;  . HERNIA REPAIR     x2  . KNEE SURGERY     x3  . MINIMALLY INVASIVE FORAMINOTOMY CERVICAL SPINE     C6-T1, Nitka  . SAVORY DILATION N/A 04/30/2017   Procedure: SAVORY DILATION;  Surgeon: Teena Irani, MD;  Location: [facility] ENDOSCOPY;  Service: Endoscopy;  Laterality: N/A;  . TONSILLECTOMY    . TOTAL ABDOMINAL HYSTERECTOMY     Sarcoma, s/p XRT   Social History   Socioeconomic History  . Marital status: Married    Spouse name: Not on file  . Number of children: Not on file  . Years of education: Not on file  . Highest education level: Not on file  Occupational History  . Not on file  Tobacco Use  . Smoking status: Passive Smoke Exposure - Never Smoker  . Smokeless tobacco: Never Used  Substance and Sexual Activity  . Alcohol use: Not Currently  . Drug use: No  . Sexual activity: Not on file  Other Topics Concern  . Not on file   Social History Narrative  . Not on file   Social Determinants of Health   Financial Resource Strain:   . Difficulty of Paying Living Expenses:   Food Insecurity:   . Worried About Charity fundraiser in the Last Year:   . Arboriculturist in the Last Year:   Transportation Needs:   . Film/video editor (Medical):   Marland Kitchen Lack of Transportation (Non-Medical):   Physical Activity:   . Days of Exercise per Week:   . Minutes of Exercise per Session:   Stress:   . Feeling of Stress :   Social Connections:   . Frequency of Communication with Friends and Family:   . Frequency of Social Gatherings with Friends and Family:   . Attends Religious Services:   . Active Member of Clubs or Organizations:   . Attends Archivist Meetings:   Marland Kitchen Marital Status:    Family History  Problem Relation Age of Onset  . Hypertension Other   . Hyperlipidemia Other   . Colon cancer Other        grandmother  . Hashimoto's thyroiditis Mother   . Eczema Mother   . Angioedema Mother   . Throat cancer Father   . Arrhythmia Father   . Angioedema  Maternal Grandfather    Allergies  Allergen Reactions  . Other     Dermabond surgical glue.   . Sulfa Antibiotics Anaphylaxis, Shortness Of Breath, Swelling and Rash    Angioedema (also)  . Sulfonamide Derivatives Hives, Shortness Of Breath and Swelling    TONGUE SWELLS  . Benadryl [Diphenhydramine Hcl] Other (See Comments)    Hyperactivity    Prior to Admission medications   Medication Sig Start Date End Date Taking? Authorizing Provider  acyclovir (ZOVIRAX) 400 MG tablet Take 400 mg by mouth 3 (three) times daily. 03/03/20  Yes [provider]  amphetamine-dextroamphetamine (ADDERALL) 20 MG tablet Take 20 mg by mouth 3 (three) times daily.   Yes [provider]  atropine 1 % ophthalmic solution Place 1 drop into the left eye daily as needed (for dry eye).   Yes [provider]  baclofen (LIORESAL) 10 MG tablet Take 10  mg by mouth daily. 02/28/20  Yes [provider]  Bepotastine Besilate (BEPREVE) 1.5 % SOLN Place 1 drop into both eyes 2 (two) times a day.   Yes [provider]  budesonide (PULMICORT) 0.25 MG/2ML nebulizer solution Swallow 47ml po BID.  Rinse and spit after.  No food or drink for 30 minutes after administration. 08/28/19  Yes [provider]  cycloSPORINE (RESTASIS) 0.05 % ophthalmic emulsion Place 1 drop into both eyes 2 (two) times daily.   Yes [provider]  dicyclomine (BENTYL) 10 MG capsule Take 10 mg by mouth daily as needed for nausea/vomiting. 01/02/19  Yes [provider]  fluconazole (DIFLUCAN) 200 MG tablet  03/23/20  Yes [provider]  fluticasone (FLOVENT HFA) 220 MCG/ACT inhaler Inhale 2 puffs into the lungs 2 (two) times daily.   Yes [provider]  folic acid (FOLVITE) 1 MG tablet Take 2 tablets (2 mg total) by mouth daily. 10/30/19  Yes Deveshwar, Abel Presto, MD  furosemide (LASIX) 20 MG tablet Take 20 mg by mouth daily as needed.    Yes [provider]  hyoscyamine (LEVSIN, ANASPAZ) 0.125 MG tablet Take 0.125 mg by mouth every 4 (four) hours as needed for cramping.  07/25/17  Yes [provider]  LORazepam (ATIVAN) 1 MG tablet Take 1 mg by mouth 3 (three) times daily as needed. 03/08/20  Yes [provider]  methotrexate 50 MG/2ML injection Inject 0.56mL into the skin once weekly. 11/29/19  Yes Deveshwar, Abel Presto, MD  metoCLOPramide (REGLAN) 10 MG tablet Take 10 mg by mouth daily.    Yes [provider]  nystatin (MYCOSTATIN) 100000 UNIT/ML suspension Take 10 mLs by mouth 3 (three) times daily. 02/25/20  Yes [provider]  ondansetron (ZOFRAN-ODT) 4 MG disintegrating tablet Take 1 tablet (4 mg total) by mouth every 8 (eight) hours as needed for nausea or vomiting. 08/05/19  Yes Upstill, Shari, PA-C  oxyCODONE-acetaminophen (PERCOCET) 7.5-325 MG tablet  03/31/20  Yes [provider]   pantoprazole (PROTONIX) 40 MG tablet Take 1 tablet (40 mg total) by mouth 2 (two) times daily. 05/02/17 08/04/28 Yes Elgergawy, Silver Huguenin, MD  Polyethyl Glycol-Propyl Glycol (SYSTANE) 0.4-0.3 % GEL ophthalmic gel Place 1 application into both eyes daily as needed (dryness).   Yes [provider]  potassium chloride (K-DUR) 10 MEQ tablet Take 10 mEq by mouth daily as needed.    Yes [provider]  prednisoLONE acetate (PRED FORTE) 1 % ophthalmic suspension Place 1 drop into the left eye 2 (two) times daily.   Yes [provider]  promethazine (PHENERGAN) 25 MG suppository Place 1 suppository (25 mg total) rectally every 6 (six) hours as needed for nausea or vomiting. 08/05/19  Yes Upstill, Shari, PA-C  promethazine (PHENERGAN) 25 MG tablet Take 25 mg by mouth every 6 (six) hours as needed for nausea or vomiting.   Yes [provider]  SUMAtriptan (IMITREX) 25 MG tablet Take 25 mg by mouth every 2 (two) hours as needed for migraine. May repeat in 2 hours if headache persists or recurs.   Yes [provider]  topiramate (TOPAMAX) 100 MG tablet Take 100 mg by mouth daily. 06/18/19  Yes [provider]  topiramate (TOPAMAX) 25 MG tablet Take 25 mg by mouth at bedtime.    Yes [provider]  traZODone (DESYREL) 150 MG tablet Take 150 mg by mouth at bedtime as needed for sleep.   Yes [provider]  TUBERCULIN SYR 1CC/27GX1/2" (B-D TB SYRINGE 1CC/27GX1/2") 27G X 1/2" 1 ML MISC 12 Syringes by Does not apply route once a week. 10/30/19  Yes Deveshwar, Abel Presto, MD  Vitamin D, Ergocalciferol, (DRISDOL) 1.25 MG (50000 UNIT) CAPS capsule Take 50,000 Units by mouth once a week. 03/09/20  Yes [provider]     Positive ROS: Otherwise negative  All other systems have been reviewed and were otherwise negative with the exception of those mentioned in the HPI and as above.  Physical Exam: Constitutional: Alert, well-appearing, no acute  distress.  Her voice sounds good to me in the office today. Ears: External ears without lesions or tenderness. Ear canals are clear bilaterally with intact, clear TMs.  Nasal: External nose without lesions. Septum midline.. Clear nasal passages bilaterally. Oral: Lips and gums without lesions. Tongue and palate mucosa without lesions. Posterior oropharynx clear..  She is status post tonsillectomy.  Clinically has no evidence of active thrush on examination of mucosal membranes. Fiberoptic laryngoscopy was performed through the left nostril.  Nasopharynx was clear.  Base of tongue vallecula and epiglottis were normal.  Vocal cords were clear bilaterally with normal vocal cord mobility.  She had no evidence of thrush or erythema of mucous membranes of the hypopharynx or larynx. Neck: No palpable adenopathy or masses Respiratory: Breathing comfortably  Skin: No facial/neck lesions or rash noted. Laryngoscopy  Date/Time: 04/02/2020 3:13 PM Performed by: Rozetta Nunnery, MD Authorized by: Rozetta Nunnery, MD   Consent:    Consent obtained:  Verbal   Consent given by:  Patient Procedure details:    Indications: direct visualization of the upper aerodigestive tract     Medication:  Afrin   Instrument: flexible fiberoptic laryngoscope     Scope location: left nare   Sinus:    Left nasopharynx: normal   Mouth:    Vallecula: normal     Base of tongue: normal     Epiglottis: normal   Throat:    True vocal cords: normal   Comments:     Clear upper airway examination on fiberoptic laryngoscopy with no evidence of active thrush.  Normal vocal cords bilaterally with normal vocal cord mobility.    Assessment: Patient with history of recurrent thrush.  This has responded well to fluconazole. History of hoarseness with clear laryngeal exam on fiberoptic endoscopy in the office today  Plan: Reviewed with patient today that she has no evidence of active thrush presently. She will  follow up here as needed.   Radene Journey, MD   CC:

## 2020-04-03 ENCOUNTER — Encounter: Payer: Self-pay | Admitting: Allergy

## 2020-04-03 DIAGNOSIS — K200 Eosinophilic esophagitis: Secondary | ICD-10-CM | POA: Insufficient documentation

## 2020-04-03 NOTE — Assessment & Plan Note (Signed)
History of EIB as a teenager. Stable now.  Monitor symptoms.

## 2020-04-03 NOTE — Assessment & Plan Note (Deleted)
In January 2021 started to get frequent bouts of oral thrush/mouth ulcers. Initially thought to be due to her swallowed Flovent use for EoE. However, she stopped using Flovent in January and had multiple reoccurrences since then. Treated with oral fluconazole and nystatin wash which help while on the medication. Recently diagnosed with RA and started on methotrexate. Having some episodes of fevers with these thrush. History of C diff x 3, shingles, pneumonia x 1, strep throat s/p tonsillectomy, poor dentitions. 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  Normal immunoglobulin levels in 2020.   Discussed at length with the patient that she has a complicated medical history and will need initial basic immune evaluation bloodwork.

## 2020-04-03 NOTE — Assessment & Plan Note (Signed)
Mild rhino conjunctivitis symptoms in the fall.  Monitor symptoms. Consider testing in future.

## 2020-04-03 NOTE — Assessment & Plan Note (Signed)
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 Patient had dilatations in the past.  Follow up with GI.  Consider food testing in future.

## 2020-04-03 NOTE — Assessment & Plan Note (Addendum)
In January 2021 started to get frequent bouts of oral thrush/mouth ulcers. Initially thought to be due to her swallowed Flovent used for EoE. However, she stopped using Flovent in January and had multiple reoccurrences since then. Treated with oral fluconazole and nystatin wash which help while on the medication. Recently diagnosed with RA and started on methotrexate. Having some episodes of fevers with these thrush. History of C diff x 3, shingles, pneumonia x 1, strep throat s/p tonsillectomy, poor dentitions. 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  Normal immunoglobulin levels in 2020.   Discussed at length with the patient that she has a complicated medical history and will order some basic immune evaluation bloodwork. Given her history there is some concern if her immunosuppressants are contributing to theses issues versus if she has some type of underlying immunodeficiency.   Get bloodwork and depending on results will make further recommendations.  Keep track of infections.

## 2020-04-08 ENCOUNTER — Ambulatory Visit: Payer: Managed Care, Other (non HMO) | Admitting: Infectious Disease

## 2020-04-08 LAB — LYMPH ENUMERATION, BASIC & NK CELLS
% CD 3 Pos. Lymph.: 75.7 % (ref 57.5–86.2)
% CD 4 Pos. Lymph.: 55.1 % (ref 30.8–58.5)
% NK (CD56/16): 3.9 % (ref 1.4–19.4)
Ab NK (CD56/16): 98 /uL (ref 24–406)
Absolute CD 3: 1893 /uL (ref 622–2402)
Absolute CD 4 Helper: 1378 /uL (ref 359–1519)
Basophils Absolute: 0.1 10*3/uL (ref 0.0–0.2)
Basos: 1 %
CD19 % B Cell: 19.4 % (ref 3.3–25.4)
CD19 Abs: 485 /uL (ref 12–645)
CD4/CD8 Ratio: 2.6 (ref 0.92–3.72)
CD8 % Suppressor T Cell: 21.2 % (ref 12.0–35.5)
CD8 T Cell Abs: 530 /uL (ref 109–897)
EOS (ABSOLUTE): 0.5 10*3/uL — ABNORMAL HIGH (ref 0.0–0.4)
Eos: 7 %
Hematocrit: 35.2 % (ref 34.0–46.6)
Hemoglobin: 11.5 g/dL (ref 11.1–15.9)
Immature Grans (Abs): 0 10*3/uL (ref 0.0–0.1)
Immature Granulocytes: 0 %
Lymphocytes Absolute: 2.5 10*3/uL (ref 0.7–3.1)
Lymphs: 39 %
MCH: 31.4 pg (ref 26.6–33.0)
MCHC: 32.7 g/dL (ref 31.5–35.7)
MCV: 96 fL (ref 79–97)
Monocytes Absolute: 0.5 10*3/uL (ref 0.1–0.9)
Monocytes: 7 %
Neutrophils Absolute: 2.9 10*3/uL (ref 1.4–7.0)
Neutrophils: 46 %
Platelets: 279 10*3/uL (ref 150–450)
RBC: 3.66 x10E6/uL — ABNORMAL LOW (ref 3.77–5.28)
RDW: 12.8 % (ref 11.7–15.4)
WBC: 6.4 10*3/uL (ref 3.4–10.8)

## 2020-04-08 LAB — STREP PNEUMONIAE 23 SEROTYPES IGG
Pneumo Ab Type 1*: 0.5 ug/mL — ABNORMAL LOW (ref >1.3)
Pneumo Ab Type 12 (12F)*: 0.4 ug/mL — ABNORMAL LOW (ref >1.3)
Pneumo Ab Type 14*: 0.5 ug/mL — ABNORMAL LOW (ref >1.3)
Pneumo Ab Type 17 (17F)*: 0.4 ug/mL — ABNORMAL LOW (ref >1.3)
Pneumo Ab Type 19 (19F)*: 1.3 ug/mL — ABNORMAL LOW (ref >1.3)
Pneumo Ab Type 2*: 0.7 ug/mL — ABNORMAL LOW (ref >1.3)
Pneumo Ab Type 20*: 1.2 ug/mL — ABNORMAL LOW (ref >1.3)
Pneumo Ab Type 22 (22F)*: 0.4 ug/mL — ABNORMAL LOW (ref >1.3)
Pneumo Ab Type 23 (23F)*: 0.1 ug/mL — ABNORMAL LOW (ref >1.3)
Pneumo Ab Type 26 (6B)*: 0.9 ug/mL — ABNORMAL LOW (ref >1.3)
Pneumo Ab Type 3*: 3.3 ug/mL (ref >1.3)
Pneumo Ab Type 34 (10A)*: 7 ug/mL (ref >1.3)
Pneumo Ab Type 4*: 0.8 ug/mL — ABNORMAL LOW (ref >1.3)
Pneumo Ab Type 43 (11A)*: 0.6 ug/mL — ABNORMAL LOW (ref >1.3)
Pneumo Ab Type 5*: 0.5 ug/mL — ABNORMAL LOW (ref >1.3)
Pneumo Ab Type 51 (7F)*: 3.1 ug/mL (ref >1.3)
Pneumo Ab Type 54 (15B)*: 0.7 ug/mL — ABNORMAL LOW (ref >1.3)
Pneumo Ab Type 56 (18C)*: 0.6 ug/mL — ABNORMAL LOW (ref >1.3)
Pneumo Ab Type 57 (19A)*: 5 ug/mL (ref >1.3)
Pneumo Ab Type 68 (9V)*: 2.5 ug/mL (ref >1.3)
Pneumo Ab Type 70 (33F)*: 0.4 ug/mL — ABNORMAL LOW (ref >1.3)
Pneumo Ab Type 8*: 0.3 ug/mL — ABNORMAL LOW (ref >1.3)
Pneumo Ab Type 9 (9N)*: 0.2 ug/mL — ABNORMAL LOW (ref >1.3)

## 2020-04-08 LAB — HBV/HCV (PROFILE VIII)
HCV Ab: <0.1 s/co ratio (ref 0.0–0.9)
Hep B C IgM: NEGATIVE
Hep B Core Total Ab: NEGATIVE
Hep B E Ab: NEGATIVE
Hep B E Ag: NEGATIVE
Hep B Surface Ab, Qual: REACTIVE
Hepatitis B Surface Ag: NEGATIVE

## 2020-04-08 LAB — DIPHTHERIA / TETANUS ANTIBODY PANEL
Diphtheria Ab: >3 IU/mL (ref <0.10)
Tetanus Ab, IgG: 2.6 IU/mL (ref <0.10)

## 2020-04-08 LAB — COMPLEMENT, TOTAL: Compl, Total (CH50): >60 U/mL (ref >41)

## 2020-04-08 LAB — HIV ANTIBODY (ROUTINE TESTING W REFLEX): HIV Screen 4th Generation wRfx: NONREACTIVE

## 2020-04-08 LAB — IGG 1, 2, 3, AND 4
IgG (Immunoglobin G), Serum: 731 mg/dL (ref 586–1602)
IgG, Subclass 1: 411 mg/dL (ref 248–810)
IgG, Subclass 2: 186 mg/dL (ref 130–555)
IgG, Subclass 3: 19 mg/dL (ref 15–102)
IgG, Subclass 4: 33 mg/dL (ref 2–96)

## 2020-04-08 LAB — IGG, IGA, IGM
IgA/Immunoglobulin A, Serum: 134 mg/dL (ref 87–352)
IgM (Immunoglobulin M), Srm: 122 mg/dL (ref 26–217)

## 2020-04-08 LAB — HCV COMMENT:

## 2020-04-08 LAB — IGE: IgE (Immunoglobulin E), Serum: 14 IU/mL (ref 6–495)

## 2020-04-13 ENCOUNTER — Ambulatory Visit: Payer: Managed Care, Other (non HMO) | Admitting: Infectious Disease

## 2020-04-13 ENCOUNTER — Telehealth: Payer: Self-pay | Admitting: *Deleted

## 2020-04-13 NOTE — Telephone Encounter (Signed)
RN left message asking patient to reschedule today's missed visit if she is still interested in the referral. Landis Gandy, RN

## 2020-04-18 ENCOUNTER — Other Ambulatory Visit: Payer: Self-pay

## 2020-04-18 ENCOUNTER — Encounter (HOSPITAL_COMMUNITY): Payer: Self-pay

## 2020-04-18 ENCOUNTER — Emergency Department (HOSPITAL_COMMUNITY)
Admission: EM | Admit: 2020-04-18 | Discharge: 2020-04-19 | Disposition: A | Discharge status: Home / Self Care | Payer: Managed Care, Other (non HMO) | Attending: Emergency Medicine | Admitting: Emergency Medicine

## 2020-04-18 DIAGNOSIS — Y929 Unspecified place or not applicable: Secondary | ICD-10-CM | POA: Insufficient documentation

## 2020-04-18 DIAGNOSIS — Z79899 Other long term (current) drug therapy: Secondary | ICD-10-CM | POA: Insufficient documentation

## 2020-04-18 DIAGNOSIS — W5503XA Scratched by cat, initial encounter: Secondary | ICD-10-CM | POA: Diagnosis not present

## 2020-04-18 DIAGNOSIS — S60922A Unspecified superficial injury of left hand, initial encounter: Secondary | ICD-10-CM | POA: Diagnosis not present

## 2020-04-18 DIAGNOSIS — Y93K9 Activity, other involving animal care: Secondary | ICD-10-CM | POA: Insufficient documentation

## 2020-04-18 DIAGNOSIS — Z23 Encounter for immunization: Secondary | ICD-10-CM | POA: Insufficient documentation

## 2020-04-18 DIAGNOSIS — S60921A Unspecified superficial injury of right hand, initial encounter: Secondary | ICD-10-CM | POA: Insufficient documentation

## 2020-04-18 DIAGNOSIS — W5501XA Bitten by cat, initial encounter: Secondary | ICD-10-CM | POA: Diagnosis not present

## 2020-04-18 DIAGNOSIS — Y939 Activity, unspecified: Secondary | ICD-10-CM | POA: Insufficient documentation

## 2020-04-18 DIAGNOSIS — S61451A Open bite of right hand, initial encounter: Secondary | ICD-10-CM

## 2020-04-18 DIAGNOSIS — Y999 Unspecified external cause status: Secondary | ICD-10-CM | POA: Diagnosis not present

## 2020-04-18 LAB — CBC WITH DIFFERENTIAL/PLATELET
Abs Immature Granulocytes: 0.02 10*3/uL (ref 0.00–0.07)
Basophils Absolute: 0 10*3/uL (ref 0.0–0.1)
Basophils Relative: 0 %
Eosinophils Absolute: 0.1 10*3/uL (ref 0.0–0.5)
Eosinophils Relative: 1 %
HCT: 36.8 % (ref 36.0–46.0)
Hemoglobin: 12.2 g/dL (ref 12.0–15.0)
Immature Granulocytes: 0 %
Lymphocytes Relative: 26 %
Lymphs Abs: 1.7 10*3/uL (ref 0.7–4.0)
MCH: 31.8 pg (ref 26.0–34.0)
MCHC: 33.2 g/dL (ref 30.0–36.0)
MCV: 95.8 fL (ref 80.0–100.0)
Monocytes Absolute: 0.5 10*3/uL (ref 0.1–1.0)
Monocytes Relative: 7 %
Neutro Abs: 4.3 10*3/uL (ref 1.7–7.7)
Neutrophils Relative %: 66 %
Platelets: 264 10*3/uL (ref 150–400)
RBC: 3.84 MIL/uL — ABNORMAL LOW (ref 3.87–5.11)
RDW: 13 % (ref 11.5–15.5)
WBC: 6.5 10*3/uL (ref 4.0–10.5)
nRBC: 0 % (ref 0.0–0.2)

## 2020-04-18 LAB — BASIC METABOLIC PANEL
Anion gap: 9 (ref 5–15)
BUN: 7 mg/dL (ref 6–20)
CO2: 23 mmol/L (ref 22–32)
Calcium: 9.8 mg/dL (ref 8.9–10.3)
Chloride: 109 mmol/L (ref 98–111)
Creatinine, Ser: 0.86 mg/dL (ref 0.44–1.00)
GFR calc Af Amer: >60 mL/min (ref >60)
GFR calc non Af Amer: >60 mL/min (ref >60)
Glucose, Bld: 107 mg/dL — ABNORMAL HIGH (ref 70–99)
Potassium: 3.8 mmol/L (ref 3.5–5.1)
Sodium: 141 mmol/L (ref 135–145)

## 2020-04-18 MED ORDER — SODIUM CHLORIDE 0.9% FLUSH: 3.0000 mL | Freq: Once | INTRAVENOUS | Status: DC

## 2020-04-18 NOTE — ED Triage Notes (Signed)
Patient here with cat bite to right wrist last night with redness moving up forearm. Patient complains of pain to extremity.

## 2020-04-19 ENCOUNTER — Encounter (HOSPITAL_COMMUNITY): Payer: Self-pay | Admitting: Emergency Medicine

## 2020-04-19 MED ORDER — SODIUM CHLORIDE 0.9 % IV SOLN
1.5000 g | Freq: Once | INTRAVENOUS | Status: AC
  Administered 2020-04-19: 1.5 g via INTRAVENOUS
  Filled 2020-04-19: qty 1.5

## 2020-04-19 MED ORDER — TETANUS-DIPHTH-ACELL PERTUSSIS 5-2.5-18.5 LF-MCG/0.5 IM SUSP
0.5000 mL | Freq: Once | INTRAMUSCULAR | Status: AC
  Administered 2020-04-19: 0.5 mL via INTRAMUSCULAR
  Filled 2020-04-19: qty 0.5

## 2020-04-19 MED ORDER — AMOXICILLIN-POT CLAVULANATE 875-125 MG PO TABS: 1.0000 | ORAL_TABLET | Freq: Two times a day (BID) | ORAL | 0 refills | Status: DC

## 2020-04-19 MED ORDER — MORPHINE SULFATE (PF) 4 MG/ML IV SOLN
4.0000 mg | Freq: Once | INTRAVENOUS | Status: AC
  Administered 2020-04-19: 4 mg via INTRAVENOUS
  Filled 2020-04-19: qty 1

## 2020-04-19 MED ORDER — PROMETHAZINE HCL 25 MG/ML IJ SOLN
12.5000 mg | Freq: Once | INTRAMUSCULAR | Status: AC
  Administered 2020-04-19: 12.5 mg via INTRAVENOUS
  Filled 2020-04-19: qty 1

## 2020-04-19 NOTE — ED Provider Notes (Addendum)
[facility] EMERGENCY DEPARTMENT Provider Note   CSN: [id number] Arrival date & time: 04/18/20  1722     History No chief complaint on file.   [name] Horn is a 44 y.o. female with history of DVT, gastroparesis, lymphedema, rheumatoid arthritis, asthma presenting for evaluation of acute onset, progressively worsening left hand pain secondary to cat bite sustained around 8 PM on Friday.  She reports that last night she attempted to pull some litter off of her cat when her cat then bit and scratched at her right hand.  She reports that her cat is not up-to-date on all of its immunizations but that it is exclusively an indoor cat and is behaving at baseline.  She has a background working in a Therapist, art.  She has noted no symptoms of rabies from her cat.  She reports soreness and throbbing pain to the dorsum of the right hand radiating down the wrist and forearm, worsens with movements.  She endorses tingling of all of her fingertips.  She has noted some swelling and erythema to the dorsum of the right hand.  She reports subjective fevers and chills, maximum temperature at home has been 100 F.  She has been taking ibuprofen and Tylenol with some improvement in her pain.  She had nausea but no vomiting.  She does report chronic nausea due to her gastroparesis however and she takes Zofran, Phenergan, and Reglan at home.  She has not been taking all of her home medications but has had her methotrexate.  She has been cleaning the wounds with warm water and soap and applying antibiotic ointment.  She has noted that a couple of the wounds have opened up and drained purulent fluid.  She is right-hand dominant.  She is unsure if her tetanus is up-to-date.  The history is provided by the patient.       Past Medical History:  Diagnosis Date  . Complication of anesthesia    " i TAKE A LIITLE BIT LONGER TO [place] UP "  . DVT (deep venous thrombosis) ([place])    x2  .  Gastroparesis 08/07/2019  . Lymphedema   . RA (rheumatoid arthritis) ([place])   . Uterine [facility])     Patient Active Problem List   Diagnosis Date Noted  . Eosinophilic esophagitis [id number]  . Encounter for screening for diseases of the blood and blood-forming organs and certain disorders involving the immune mechanism 04/02/2020  . Oral candidiasis 04/02/2020  . History of Clostridium difficile infection 04/02/2020  . History of shingles 04/02/2020  . History of asthma 04/02/2020  . Multiple drug allergies 04/02/2020  . Environmental allergies 04/02/2020  . Colitis 08/14/2019  . Constipation   . Gastroparesis 08/07/2019  . Nausea and vomiting 04/26/2017  . Nausea & vomiting 04/25/2017  . Chest pain 04/25/2017  . ANKLE, PAIN 11/24/2010  . ANXIETY 10/11/2010  . DVT 10/11/2010  . COSTOCHONDRITIS 03/03/2008  . CELLULITIS, GREAT TOE 12/01/2007  . SINUSITIS, ACUTE NOS 09/25/2007  . MENOPAUSE, PREMATURE 07/20/2007  . UTERINE CANCER, HX OF 07/16/2007  . Other postprocedural status(V45.89) 07/16/2007    Past Surgical History:  Procedure Laterality Date  . APPENDECTOMY    . CHOLECYSTECTOMY    . ESOPHAGOGASTRODUODENOSCOPY N/A 04/30/2017   Procedure: ESOPHAGOGASTRODUODENOSCOPY (EGD);  Surgeon: Teena Irani, MD;  Location: [facility] ENDOSCOPY;  Service: Endoscopy;  Laterality: N/A;  . HERNIA REPAIR     x2  . KNEE SURGERY     x3  . MINIMALLY INVASIVE FORAMINOTOMY  CERVICAL SPINE     C6-T1, Nitka  . SAVORY DILATION N/A 04/30/2017   Procedure: SAVORY DILATION;  Surgeon: Teena Irani, MD;  Location: [facility] ENDOSCOPY;  Service: Endoscopy;  Laterality: N/A;  . TONSILLECTOMY    . TOTAL ABDOMINAL HYSTERECTOMY     Sarcoma, s/p XRT     OB History   No obstetric history on file.     Family History  Problem Relation Age of Onset  . Hypertension Other   . Hyperlipidemia Other   . Colon cancer Other        grandmother  . Hashimoto's thyroiditis Mother   . Eczema Mother   . Angioedema Mother    . Throat cancer Father   . Arrhythmia Father   . Angioedema Maternal Grandfather     Social History   Tobacco Use  . Smoking status: Passive Smoke Exposure - Never Smoker  . Smokeless tobacco: Never Used  Substance Use Topics  . Alcohol use: Not Currently  . Drug use: No    Home Medications Prior to Admission medications   Medication Sig Start Date End Date Taking? Authorizing Provider  acyclovir (ZOVIRAX) 400 MG tablet Take 400 mg by mouth 3 (three) times daily. 03/03/20   [provider]  amphetamine-dextroamphetamine (ADDERALL) 20 MG tablet Take 20 mg by mouth 3 (three) times daily.    [provider]  atropine 1 % ophthalmic solution Place 1 drop into the left eye daily as needed (for dry eye).    [provider]  baclofen (LIORESAL) 10 MG tablet Take 10 mg by mouth daily. 02/28/20   [provider]  Bepotastine Besilate (BEPREVE) 1.5 % SOLN Place 1 drop into both eyes 2 (two) times a day.    [provider]  budesonide (PULMICORT) 0.25 MG/2ML nebulizer solution Swallow 18ml po BID.  Rinse and spit after.  No food or drink for 30 minutes after administration. 08/28/19   [provider]  cycloSPORINE (RESTASIS) 0.05 % ophthalmic emulsion Place 1 drop into both eyes 2 (two) times daily.    [provider]  dicyclomine (BENTYL) 10 MG capsule Take 10 mg by mouth daily as needed for nausea/vomiting. 01/02/19   [provider]  fluconazole (DIFLUCAN) 200 MG tablet  03/23/20   [provider]  fluticasone (FLOVENT HFA) 220 MCG/ACT inhaler Inhale 2 puffs into the lungs 2 (two) times daily.    [provider]  folic acid (FOLVITE) 1 MG tablet Take 2 tablets (2 mg total) by mouth daily. 10/30/19   Bo Merino, MD  furosemide (LASIX) 20 MG tablet Take 20 mg by mouth daily as needed.     [provider]  hyoscyamine (LEVSIN, ANASPAZ) 0.125 MG tablet Take 0.125 mg by mouth every 4 (four) hours as  needed for cramping.  07/25/17   [provider]  LORazepam (ATIVAN) 1 MG tablet Take 1 mg by mouth 3 (three) times daily as needed. 03/08/20   [provider]  methotrexate 50 MG/2ML injection Inject 0.11mL into the skin once weekly. 11/29/19   Bo Merino, MD  metoCLOPramide (REGLAN) 10 MG tablet Take 10 mg by mouth daily.     [provider]  nystatin (MYCOSTATIN) 100000 UNIT/ML suspension Take 10 mLs by mouth 3 (three) times daily. 02/25/20   [provider]  ondansetron (ZOFRAN-ODT) 4 MG disintegrating tablet Take 1 tablet (4 mg total) by mouth every 8 (eight) hours as needed for nausea or vomiting. 08/05/19   Charlann Lange, PA-C  oxyCODONE-acetaminophen (  PERCOCET) 7.5-325 MG tablet  03/31/20   [provider]  pantoprazole (PROTONIX) 40 MG tablet Take 1 tablet (40 mg total) by mouth 2 (two) times daily. 05/02/17 08/04/28  Elgergawy, Silver Huguenin, MD  Polyethyl Glycol-Propyl Glycol (SYSTANE) 0.4-0.3 % GEL ophthalmic gel Place 1 application into both eyes daily as needed (dryness).    [provider]  potassium chloride (K-DUR) 10 MEQ tablet Take 10 mEq by mouth daily as needed.     [provider]  prednisoLONE acetate (PRED FORTE) 1 % ophthalmic suspension Place 1 drop into the left eye 2 (two) times daily.    [provider]  promethazine (PHENERGAN) 25 MG suppository Place 1 suppository (25 mg total) rectally every 6 (six) hours as needed for nausea or vomiting. 08/05/19   Charlann Lange, PA-C  promethazine (PHENERGAN) 25 MG tablet Take 25 mg by mouth every 6 (six) hours as needed for nausea or vomiting.    [provider]  SUMAtriptan (IMITREX) 25 MG tablet Take 25 mg by mouth every 2 (two) hours as needed for migraine. May repeat in 2 hours if headache persists or recurs.    [provider]  topiramate (TOPAMAX) 100 MG tablet Take 100 mg by mouth daily. 06/18/19   [provider]  topiramate (TOPAMAX) 25  MG tablet Take 25 mg by mouth at bedtime.     [provider]  traZODone (DESYREL) 150 MG tablet Take 150 mg by mouth at bedtime as needed for sleep.    [provider]  TUBERCULIN SYR 1CC/27GX1/2" (B-D TB SYRINGE 1CC/27GX1/2") 27G X 1/2" 1 ML MISC 12 Syringes by Does not apply route once a week. 10/30/19   Bo Merino, MD  Vitamin D, Ergocalciferol, (DRISDOL) 1.25 MG (50000 UNIT) CAPS capsule Take 50,000 Units by mouth once a week. 03/09/20   [provider]    Allergies    Other, Sulfa antibiotics, Sulfonamide derivatives, and Benadryl [diphenhydramine hcl]  Review of Systems   Review of Systems  Constitutional: Positive for chills and fever.  Respiratory: Negative for shortness of breath.   Cardiovascular: Negative for chest pain.  Gastrointestinal: Positive for nausea. Negative for abdominal pain and vomiting.  Musculoskeletal: Positive for arthralgias.  Skin: Positive for color change and wound.  Neurological: Positive for numbness. Negative for weakness.  All other systems reviewed and are negative.   Physical Exam Updated Vital Signs BP 107/76   Pulse 76   Temp 97.7 F (36.5 C)   Resp 12   SpO2 100%   Physical Exam Vitals and nursing note reviewed.  Constitutional:      General: She is not in acute distress.    Appearance: She is well-developed.  HENT:     Head: Normocephalic and atraumatic.  Eyes:     General:        Right eye: No discharge.        Left eye: No discharge.     Conjunctiva/sclera: Conjunctivae normal.  Neck:     Vascular: No JVD.     Trachea: No tracheal deviation.  Cardiovascular:     Rate and Rhythm: Normal rate.     Pulses: Normal pulses.     Comments: 2+ radial pulses bilaterally Pulmonary:     Effort: Pulmonary effort is normal.  Abdominal:     General: There is no distension.  Musculoskeletal:        General: Swelling and tenderness present.     Cervical back: Neck supple.     Comments:  See below  images.  Patient with erythema and mild swelling to the dorsum of the right hand.  She has numerous small superficial wounds consistent with scratches and bites from her pet cat.  5/5 strength of bilateral wrist and digits with flexion and extension against resistance.  She is able to perform thumb opposition without limitation.  Pen marking from when she was initially triaged with some mild surrounding erythema.  No streaking.  Skin:    General: Skin is warm and dry.     Capillary Refill: Capillary refill takes less than 2 seconds.     Findings: Erythema present.  Neurological:     Mental Status: She is alert.     Comments: Mildly altered sensation to light touch of the right hand diffusely, worst along the dorsum of the hand.  Good grip strength bilaterally.  Psychiatric:        Behavior: Behavior normal.         ED Results / Procedures / Treatments   Labs (all labs ordered are listed, but only abnormal results are displayed) Labs Reviewed  CBC WITH DIFFERENTIAL/PLATELET - Abnormal; Notable for the following components:      Result Value   RBC 3.84 (*)    All other components within normal limits  BASIC METABOLIC PANEL - Abnormal; Notable for the following components:   Glucose, Bld 107 (*)    All other components within normal limits    EKG None  Radiology No results found.  Procedures Procedures (including critical care time)  Medications Ordered in ED Medications  sodium chloride flush (NS) 0.9 % injection 3 mL (has no administration in time range)  Tdap (BOOSTRIX) injection 0.5 mL (0.5 mLs Intramuscular Given 04/19/20 0318)  ampicillin-sulbactam (UNASYN) 1.5 g in sodium chloride 0.9 % 100 mL IVPB (0 g Intravenous Stopped 04/19/20 0529)  morphine 4 MG/ML injection 4 mg (4 mg Intravenous Given 04/19/20 0314)  promethazine (PHENERGAN) injection 12.5 mg (12.5 mg Intravenous Given 04/19/20 0314)    ED Course  I have reviewed the triage vital signs and the nursing  notes.  Pertinent labs & imaging results that were available during my care of the patient were reviewed by me and considered in my medical decision making (see chart for details).    MDM Rules/Calculators/A&P                      Patient presenting for evaluation of right hand pain, swelling after cat bite and scratch sustained on Friday evening.  She is afebrile in the ED, vital signs are stable.  She is nontoxic in appearance.  Overall she is clinically well in appearance.  Mild swelling noted to the dorsum of the right hand with associated erythema but no streaking.  She is able to range at the wrist and digits without difficulty despite pain.  Mild paresthesias noted to the fingertips which could be associated with diffuse soft tissue swelling.  Physical examination does not suggest drainable underlying abscess.  Doubt necrotizing fasciitis.  Lab work reviewed and interpreted by myself shows no leukocytosis, no anemia, no metabolic derangements, no renal insufficiency.  Her tetanus was updated in the ED and she was given IV antibiotics and symptomatic management in the ED. She does not appear overtly septic.  We did shared decision-making conversation regarding the utility of admission for IV antibiotics and observation versus monitoring symptoms at home and p.o. antibiotics outpatient. She is open to the later.  Her tetanus was updated in  the ED.  We will discharge her with a course of Augmentin.  She will follow-up with her PCP closely on an outpatient basis and she was also given the information for hand surgery to follow-up with if needed.  Discussed strict ED return precautions including fever, vomiting, streaking of redness up the arm, lymphadenopathy, worsening swelling or paresthesias.  Patient verbalized understanding of and agreement with plan and is stable for discharge at this time.  Discussed with Dr. Randal Buba who agrees with assessment and plan at this time.  5:55 AM Spoke with Cecilie Lowers  with our pharmacy staff who recommends switching patient to Clindamycin and recommends probiotic as well due to risk of toxicity with penicillins and methotrexate.  Spoke with Gerald Stabs with [employer], discussed recommendations to discontinue Augmentin.  Patient will instead start a course of clindamycin, 600 mg 3 times daily for 7 days. Gerald Stabs verbalized understanding of change in medications.    Final Clinical Impression(s) / ED Diagnoses Final diagnoses:  Cat bite of right hand, initial encounter    Rx / DC Orders ED Discharge Orders         Ordered    amoxicillin-clavulanate (AUGMENTIN) 875-125 MG tablet  Every 12 hours,   Status:  Discontinued     04/19/20 0425           Renita Papa, PA-C 04/19/20 0440    Palumbo, April, MD 04/19/20 Crabtree, Bouton, PA-C 04/19/20 0603    Palumbo, April, MD 04/19/20 [id number]

## 2020-04-19 NOTE — Discharge Instructions (Addendum)
Please take all of your antibiotics until finished!   Take your antibiotics with food.  Common side effects of antibiotics include nausea, vomiting, abdominal discomfort, and diarrhea. You may help offset some of this with probiotics which you can buy or get in yogurt. Do not eat  or take the probiotics until 2 hours after your antibiotic.    Some studies suggest that certain antibiotics can reduce the efficacy of certain oral contraceptive pills (birth control), so please use additional contraceptives (condoms or other barrier method) while you are taking the antibiotics and for an additional 5 to 7 days afterwards if you are a female on these medications.  You can take 1 to 2 tablets of Tylenol (350mg -1000mg  depending on the dose) every 6 hours as needed for pain.  Do not exceed 4000 mg of Tylenol daily.  If your pain persists you can take a dose of ibuprofen in between doses of Tylenol.  I usually recommend 400 to 600 mg of ibuprofen every 6 hours.  Take this with food to avoid upset stomach issues.  Call your PCP on Monday to schedule follow-up for this week.  Please also ask them about taking the antibiotic with your methotrexate and follow their recommendations regarding any dose adjustments you might need to make.  You can also touch base with the hand doctor for reevaluation as well, whoever can see you sooner.  Return to the emergency department immediately if any concerning signs or symptoms develop such as spreading redness, worsening swelling, fevers, vomiting, weakness, streaking of redness up the arm, swollen lymph nodes.

## 2020-04-19 NOTE — ED Notes (Signed)
Pt verbalized understanding of d/c instructions, follow up care and s/s requiring return to ed. Pt had no futher questions and was transported to exit to via wheelchair

## 2020-04-23 ENCOUNTER — Ambulatory Visit (INDEPENDENT_AMBULATORY_CARE_PROVIDER_SITE_OTHER): Payer: Managed Care, Other (non HMO) | Admitting: Allergy

## 2020-04-23 ENCOUNTER — Encounter: Payer: Self-pay | Admitting: Allergy

## 2020-04-23 ENCOUNTER — Other Ambulatory Visit: Payer: Self-pay

## 2020-04-23 VITALS — BP 114/78 | HR 80 | Temp 98.3°F | Resp 16

## 2020-04-23 DIAGNOSIS — T781XXD Other adverse food reactions, not elsewhere classified, subsequent encounter: Secondary | ICD-10-CM | POA: Diagnosis not present

## 2020-04-23 DIAGNOSIS — J310 Chronic rhinitis: Secondary | ICD-10-CM | POA: Insufficient documentation

## 2020-04-23 DIAGNOSIS — K200 Eosinophilic esophagitis: Secondary | ICD-10-CM

## 2020-04-23 DIAGNOSIS — T781XXA Other adverse food reactions, not elsewhere classified, initial encounter: Secondary | ICD-10-CM | POA: Insufficient documentation

## 2020-04-23 DIAGNOSIS — D808 Other immunodeficiencies with predominantly antibody defects: Secondary | ICD-10-CM

## 2020-04-23 DIAGNOSIS — T7819XA Other adverse food reactions, not elsewhere classified, initial encounter: Secondary | ICD-10-CM | POA: Insufficient documentation

## 2020-04-23 DIAGNOSIS — Z8709 Personal history of other diseases of the respiratory system: Secondary | ICD-10-CM

## 2020-04-23 NOTE — Assessment & Plan Note (Signed)
Past history - In January 2021 started to get frequent bouts of oral thrush/mouth ulcers. Initially thought to be due to her swallowed Flovent used for EoE. However, she stopped using Flovent in January and had multiple reoccurrences since then. Treated with oral fluconazole and nystatin wash which help while on the medication. Recently diagnosed with RA and started on methotrexate. Having some episodes of fevers with these thrush. History of C diff x 3, shingles, pneumonia x 1, strep throat s/p tonsillectomy, poor dentitions. 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  Normal immunoglobulin levels in 2020.  Interim history - normal IgG, IgA, IgM, IgE, CH50, CD19, CD3, CD4, CD8, NK cells, good tetanus and diptheria titers; nonreactive to HIV and hepatitis panel. Poor pneumococcal titers despite both pneumovax and prevnar vaccine within the past 6 months.   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 She is not having frequent upper/lower respiratory infections at this time and given her normal immunoglobulin levels there is no indication for IgG replacement or prophylactic antibiotics.   Follow up with ID as scheduled and depending on their work up and results will discuss whether it would be worthwhile ordering additional in depth immunology bloodwork and/or referral to [facility] immunology.   Keep track of infections.

## 2020-04-23 NOTE — Patient Instructions (Addendum)
Today's skin testing showed: Negative to environmental and food allergy panel  Thrush and infections  Follow up with ID as scheduled.   Keep track of infections.  You had a poor response to pneumovax and prevnar - specific antibody deficiency.   History of asthma  Monitor symptoms.   Environmental allergies  May use over the counter antihistamines such as Zyrtec (cetirizine), Claritin (loratadine), Allegra (fexofenadine), or Xyzal (levocetirizine) daily as needed.  Eosinophilic esophagitis  Follow up with GI regarding repeat EGD.  Start elimination diet with avoiding all animal milk products.  Follow up in 2 months or sooner if needed.

## 2020-04-23 NOTE — Progress Notes (Signed)
Follow Up Note  RE: [name] Horn MRN: [id number] DOB: 07/26/1976 Date of Office Visit: 04/23/2020  Referring provider: Burnadette Peter, MD Primary care provider: Alan Ripper, [place]  Chief Complaint: Allergy Testing  History of Present Illness: I had the pleasure of seeing [name] Horn for a follow up visit at the [facility] on 04/23/2020. She is a 44 y.o. female, who is being followed for history of frequent infections, history of asthma, rhino conjunctivitis, eosinophilic esophagitis. Her previous allergy office visit was on 04/02/2020 with Dr. Maudie Mercury. Today is a skin testing and follow up.  Thrush/infections Patient is still having issues with thrush and still on nystatin swish and fluconazole - prescribed by PCP.  She saw ENT and evaluation was normal. Patient is scheduled to see ID in May.   Patient had to get IV antibiotics and oral doxycycline after a recent cat bite from her own cat.   Patient had Prevnar 13 on 10/30/2019 and pneumovax 23 on 01/09/2020  History of asthma No issues.  Environmental allergies Waxes and wanes. Today having some itchy eyes.  Eosinophilic esophagitis Patient has noted some food getting stuck and more difficulty swallowing her foods since she stopped Flovent.  Currently on Protonix 40mg  BID. Has not seen her GI doctor yet. Due to her gastroparesis she is only eating one meal per day which contains cheese daily.   Assessment and Plan: [name] Horn is a 44 y.o. 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 Initially thought to be due to her swallowed Flovent used for EoE. However, she stopped using Flovent in January and had multiple reoccurrences since then. Treated with oral fluconazole and nystatin wash which help while on the medication. Recently diagnosed  with RA and started on methotrexate. Having some episodes of fevers with these thrush. History of C diff x 3, shingles, pneumonia x 1, strep throat s/p tonsillectomy, poor dentitions. 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  Normal immunoglobulin levels in 2020.  Interim history - normal IgG, IgA, IgM, IgE, CH50, CD19, CD3, CD4, CD8, NK cells, good tetanus and diptheria titers; nonreactive to HIV and hepatitis panel. Poor pneumococcal titers despite both pneumovax and prevnar vaccine within the past 6 months.   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 She is not having frequent upper/lower respiratory infections at this time and given her normal immunoglobulin levels there is no indication for IgG replacement or prophylactic antibiotics.   Follow up with ID as scheduled and depending on their work up and results will discuss whether it would be worthwhile ordering additional in depth immunology bloodwork and/or referral to [facility] immunology.   Keep track of infections.  Eosinophilic esophagitis Past history - Diagnosed with EoE meany years ago and was on Flovent for 3 years. Patient had dilatations in the past. Interim history - Noticing clinical symptoms with difficulty swallowing since stopped Flovent. Given her history with thrush, it would be crucial to identify her trigger foods and start to eliminate those from her diet rather than having patient on swallowed Flovent.   Today's food panel was negative.  Follow up with GI regarding repeat EGD.  Start elimination diet with avoiding all animal milk products first for at least 4-6 weeks and monitor  symptoms - handout given.  Chronic rhinitis Past history - Mild rhino conjunctivitis symptoms in the fall. Interim history - itchy eyes lately.   Today's skin testing was negative to environmental allergies.  May use over the  counter antihistamines such as Zyrtec (cetirizine), Claritin (loratadine), Allegra (fexofenadine), or Xyzal (levocetirizine) daily as needed.  History of asthma Past history - History of EIB as a teenager. Stable now.  Monitor symptoms.   Return in about 2 months (around 06/23/2020).  Diagnostics: Skin Testing: Environmental allergy panel and food panel. Negative test to: both panels as below.  Results discussed with patient/family. Airborne Adult Perc - 04/23/20 1401    Time Antigen Placed  1401    Allergen Manufacturer  Lavella Hammock    Location  Back    Number of Test  59    Panel 1  Select    1. Control-Buffer 50% Glycerol  Negative    2. Control-Histamine 1 mg/ml  2+    3. Albumin saline  Negative    4. [place]  Negative    5. [place]  Negative    6. [place]  Negative    7. [place] Blue  Negative    8. Meadow Fescue  Negative    9. Perennial Rye  Negative    10. Sweet Vernal  Negative    11. Timothy  Negative    12. Cocklebur  Negative    13. Burweed Marshelder  Negative    14. Ragweed, short  Negative    15. Ragweed, Giant  Negative    16. Plantain,  English  Negative    17. Lamb's Quarters  Negative    18. Sheep Sorrell  Negative    19. Rough Pigweed  Negative    20. Marsh Elder, Rough  Negative    21. Mugwort, Common  Negative    22. Ash mix  Negative    23. Birch mix  Negative    24. Beech American  Negative    25. Box, Elder  Negative    26. Cedar, red  Negative    27. Cottonwood, [place]  Negative    28. Elm mix  Negative    29. Hickory mix  Negative    30. Maple mix  Negative    31. Oak, [place] mix  Negative    32. Pecan Pollen  Negative    33. Pine mix  Negative    34. Sycamore Eastern  Negative    35. [place], Black Pollen  Negative    36. Alternaria alternata  Negative    37. Cladosporium Herbarum  Negative    38. Aspergillus mix  Negative    39. Penicillium mix  Negative    40. Bipolaris sorokiniana (Helminthosporium)  Negative    41. Drechslera  spicifera (Curvularia)  Negative    42. Mucor plumbeus  Negative    43. Fusarium moniliforme  Negative    44. Aureobasidium pullulans (pullulara)  Negative    45. Rhizopus oryzae  Negative    46. Botrytis cinera  Negative    47. Epicoccum nigrum  Negative    48. Phoma betae  Negative    49. Candida Albicans  Negative    50. Trichophyton mentagrophytes  Negative    51. Mite, D Farinae  5,000 AU/ml  Negative    52. Mite, D Pteronyssinus  5,000 AU/ml  Negative    53. Cat Hair 10,000 BAU/ml  Negative    54.  Dog Epithelia  Negative    55. Mixed Feathers  Negative    56. Horse Epithelia  Negative    57. Cockroach, German  Negative    58. Mouse  Negative    59. Tobacco Leaf  Negative     Food Adult Perc - 04/23/20 1400    Time Antigen Placed  1401    Allergen Manufacturer  Lavella Hammock    Location  Back    Number of allergen test  72    Control-Histamine 1 mg/ml  2+    1. Peanut  Negative    2. Soybean  Negative    3. Wheat  Negative    4. Sesame  Negative    5. Milk, cow  Negative    6. Egg White, Chicken  Negative    7. Casein  Negative    8. Shellfish Mix  Negative    9. Fish Mix  Negative    10. Cashew  Negative    11. Pecan Food  Negative    12. Council  Negative    13. Almond  Negative    14. Hazelnut  Negative    15. [place] nut  Negative    16. Coconut  Negative    17. Pistachio  Negative    18. Catfish  Negative    19. Bass  Negative    20. Trout  Negative    21. Tuna  Negative    22. Salmon  Negative    23. Flounder  Negative    24. Codfish  Negative    25. Shrimp  Negative    26. Crab  Negative    27. Lobster  Negative    28. Oyster  Negative    29. Scallops  Negative    30. Barley  Negative    31. Oat   Negative    32. Rye   Negative    33. Hops  Negative    34. Rice  Negative    35. Cottonseed  Negative    36. Saccharomyces Cerevisiae   Negative    37. Pork  Negative    38. [place] Meat  Negative    39. Chicken Meat  Negative    40. Beef  Negative      41. Lamb  Negative    42. Tomato  Negative    43. White Potato  Negative    44. Sweet Potato  Negative    45. Pea, Green/English  Negative    46. Navy Bean  Negative    47. Mushrooms  Negative    48. Avocado  Negative    49. Onion  Negative    50. Cabbage  Negative    51. Carrots  Negative    52. Celery  Negative    53. Corn  Negative    54. Cucumber  Negative    55. Grape (White seedless)  Negative    56. Orange   Negative    57. Banana  Negative    58. Apple  Negative    59. Peach  Negative    60. Strawberry  Negative    61. Cantaloupe  Negative    62. Watermelon  Negative    63. Pineapple  Negative    64. Chocolate/Cacao bean  Negative    65. Karaya Gum  Negative    66. Acacia (Arabic Gum)  Negative    67. Cinnamon  Negative    68. Nutmeg  Negative    69. Ginger  Negative    70. Garlic  Negative  71. Pepper, black  Negative    72. Mustard  Negative       Medication List:  Current Outpatient Medications  Medication Sig Dispense Refill  . acyclovir (ZOVIRAX) 400 MG tablet Take 400 mg by mouth 3 (three) times daily.    Marland Kitchen amphetamine-dextroamphetamine (ADDERALL) 20 MG tablet Take 20 mg by mouth 3 (three) times daily.    Marland Kitchen atropine 1 % ophthalmic solution Place 1 drop into the left eye daily as needed (for dry eye).    . baclofen (LIORESAL) 10 MG tablet Take 10 mg by mouth daily.    . Bepotastine Besilate (BEPREVE) 1.5 % SOLN Place 1 drop into both eyes 2 (two) times a day.    . budesonide (PULMICORT) 0.25 MG/2ML nebulizer solution Swallow 16ml po BID.  Rinse and spit after.  No food or drink for 30 minutes after administration.    . clindamycin (CLEOCIN) 300 MG capsule Take 600 mg by mouth 3 (three) times daily.    . cycloSPORINE (RESTASIS) 0.05 % ophthalmic emulsion Place 1 drop into both eyes 2 (two) times daily.    Marland Kitchen dicyclomine (BENTYL) 10 MG capsule Take 10 mg by mouth daily as needed for nausea/vomiting.    Marland Kitchen doxycycline (ADOXA) 100 MG tablet Take 100 mg by  mouth 2 (two) times daily.    . fluconazole (DIFLUCAN) 200 MG tablet     . fluticasone (FLOVENT HFA) 220 MCG/ACT inhaler Inhale 2 puffs into the lungs 2 (two) times daily.    . folic acid (FOLVITE) 1 MG tablet Take 2 tablets (2 mg total) by mouth daily. 180 tablet 3  . furosemide (LASIX) 20 MG tablet Take 20 mg by mouth daily as needed.     . hyoscyamine (LEVSIN, ANASPAZ) 0.125 MG tablet Take 0.125 mg by mouth every 4 (four) hours as needed for cramping.     Marland Kitchen LORazepam (ATIVAN) 1 MG tablet Take 1 mg by mouth 3 (three) times daily as needed.    . methotrexate 50 MG/2ML injection Inject 0.29mL into the skin once weekly. 10 mL 0  . metoCLOPramide (REGLAN) 10 MG tablet Take 10 mg by mouth daily.     Marland Kitchen nystatin (MYCOSTATIN) 100000 UNIT/ML suspension Take 10 mLs by mouth 3 (three) times daily.    . ondansetron (ZOFRAN-ODT) 4 MG disintegrating tablet Take 1 tablet (4 mg total) by mouth every 8 (eight) hours as needed for nausea or vomiting. 15 tablet 0  . oxyCODONE-acetaminophen (PERCOCET) 7.5-325 MG tablet     . pantoprazole (PROTONIX) 40 MG tablet Take 1 tablet (40 mg total) by mouth 2 (two) times daily. 60 tablet 1  . Polyethyl Glycol-Propyl Glycol (SYSTANE) 0.4-0.3 % GEL ophthalmic gel Place 1 application into both eyes daily as needed (dryness).    . potassium chloride (K-DUR) 10 MEQ tablet Take 10 mEq by mouth daily as needed.     . prednisoLONE acetate (PRED FORTE) 1 % ophthalmic suspension Place 1 drop into the left eye 2 (two) times daily.    . promethazine (PHENERGAN) 25 MG suppository Place 1 suppository (25 mg total) rectally every 6 (six) hours as needed for nausea or vomiting. 12 each 0  . promethazine (PHENERGAN) 25 MG tablet Take 25 mg by mouth every 6 (six) hours as needed for nausea or vomiting.    . SUMAtriptan (IMITREX) 25 MG tablet Take 25 mg by mouth every 2 (two) hours as needed for migraine. May repeat in 2 hours if headache persists or recurs.    Marland Kitchen  topiramate (TOPAMAX) 100 MG  tablet Take 100 mg by mouth daily.    Marland Kitchen topiramate (TOPAMAX) 25 MG tablet Take 25 mg by mouth at bedtime.     . traZODone (DESYREL) 150 MG tablet Take 150 mg by mouth at bedtime as needed for sleep.    . TUBERCULIN SYR 1CC/27GX1/2" (B-D TB SYRINGE 1CC/27GX1/2") 27G X 1/2" 1 ML MISC 12 Syringes by Does not apply route once a week. 12 each 3  . Vitamin D, Ergocalciferol, (DRISDOL) 1.25 MG (50000 UNIT) CAPS capsule Take 50,000 Units by mouth once a week.     No current facility-administered medications for this visit.   Allergies: Allergies  Allergen Reactions  . Other     Dermabond surgical glue.   . Sulfa Antibiotics Anaphylaxis, Shortness Of Breath, Swelling and Rash    Angioedema (also)  . Sulfonamide Derivatives Hives, Shortness Of Breath and Swelling    TONGUE SWELLS  . Benadryl [Diphenhydramine Hcl] Other (See Comments)    Hyperactivity    I reviewed her past medical history, social history, family history, and environmental history and no significant changes have been reported from her previous visit.  Review of Systems  Constitutional: Negative for appetite change, chills, fever and unexpected weight change.  HENT: Positive for sore throat and voice change. Negative for congestion and rhinorrhea.   Eyes: Positive for itching.  Respiratory: Negative for cough, chest tightness, shortness of breath and wheezing.   Cardiovascular: Negative for chest pain.  Gastrointestinal: Positive for abdominal pain.  Genitourinary: Negative for difficulty urinating.  Skin: Positive for rash.  Allergic/Immunologic: Negative for environmental allergies and food allergies.  Neurological: Negative for headaches.   Objective: BP 114/78 (BP Location: Left Arm, Patient Position: Sitting, Cuff Size: Normal)   Pulse 80   Temp 98.3 F (36.8 C) (Temporal)   Resp 16  There is no height or weight on file to calculate BMI. Physical Exam  Constitutional: She is oriented to person, place, and time.  She appears well-developed and well-nourished.  HENT:  Head: Normocephalic and atraumatic.  Right Ear: External ear normal.  Left Ear: External ear normal.  Nose: Nose normal.  Multiple upper teeth missing. Few areas on the tongue with whitish patches.   Eyes: Conjunctivae and EOM are normal.  Cardiovascular: Normal rate, regular rhythm and normal heart sounds. Exam reveals no gallop and no friction rub.  No murmur heard. Pulmonary/Chest: Effort normal and breath sounds normal. She has no wheezes. She has no rales.  Abdominal: Soft.  Musculoskeletal:     Cervical back: Neck supple.  Neurological: She is alert and oriented to person, place, and time.  Skin: Skin is warm. Rash noted.  Linear marks on forearms bilaterally where she was bitten by her cat.  Psychiatric: She has a normal mood and affect. Her behavior is normal.  Nursing note and vitals reviewed.  Previous notes and tests were reviewed. The plan was reviewed with the patient/family, and all questions/concerned were addressed.  It was my pleasure to see [name] Horn today and participate in her care. Please feel free to contact me with any questions or concerns.  Sincerely,  Rexene Alberts, DO Allergy & Immunology  [facility] office: [phone] [facility] office: [place] office: [phone]

## 2020-04-23 NOTE — Assessment & Plan Note (Signed)
Past history - History of EIB as a teenager. Stable now.  Monitor symptoms.

## 2020-04-23 NOTE — Assessment & Plan Note (Signed)
Past history - Diagnosed with EoE meany years ago and was on Flovent for 3 years. Patient had dilatations in the past. Interim history - Noticing clinical symptoms with difficulty swallowing since stopped Flovent. Given her history with thrush, it would be crucial to identify her trigger foods and start to eliminate those from her diet rather than having patient on swallowed Flovent.   Today's food panel was negative.  Follow up with GI regarding repeat EGD.  Start elimination diet with avoiding all animal milk products first for at least 4-6 weeks and monitor symptoms - handout given.

## 2020-04-23 NOTE — Assessment & Plan Note (Signed)
Past history - Mild rhino conjunctivitis symptoms in the fall. Interim history - itchy eyes lately.   Today's skin testing was negative to environmental allergies.  May use over the counter antihistamines such as Zyrtec (cetirizine), Claritin (loratadine), Allegra (fexofenadine), or Xyzal (levocetirizine) daily as needed.

## 2020-04-29 NOTE — Progress Notes (Signed)
Office Visit Note  Patient: [name] Horn             Date of Birth: 13-Mar-1976           MRN: [id number]             PCP: Alan Ripper, PA Referring: Burnadette Peter, MD Visit Date: 05/06/2020 Occupation: @GUAROCC @  Subjective:  Left eye pain   History of Present Illness: [name] Horn is a 44 y.o. female with history of seropositive rheumatoid arthritis.  Patient is on methotrexate 0.8 mL injections once weekly and folic acid 2 mg by mouth daily.  Patient reports that she was evaluated in the ED on 04/18/2020 after her cat bit her on the right wrist.  She was given IV antibiotics and discharged on doxycycline for 7 days which she completed.  She had methotrexate for 1 week while taking doxycycline.  She states that while off of methotrexate she had a flare in her left eye.  She has been using eyedrops and has resumed methotrexate which has been improving her symptoms.  She states that overall her eye inflammation has improved significantly since starting on methotrexate.  She says she continues to have pain and stiffness in both hands.  She has been having increased difficulty gripping and opening jars.  She denies any joint swelling at this time.  She denies any other increased joint pain or joint swelling. She states that she has an upcoming appointment with infectious disease on Monday for evaluation of recurrent oral thrush.  Patient reports that she was recently evaluated by asthma and allergist specialist which perform thorough testing.  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    Activities of Daily Living:  Patient reports morning stiffness for 1  hour.   Patient Denies nocturnal pain.  Difficulty dressing/grooming: Reports Difficulty climbing stairs: Reports Difficulty getting out of chair: Denies Difficulty using hands for taps, buttons, cutlery, and/or writing: Reports  Review of Systems  Constitutional:  Positive for fatigue.  HENT: Positive for mouth dryness. Negative for mouth sores and nose dryness.   Eyes: Positive for dryness. Negative for pain and visual disturbance.  Respiratory: Negative for cough, hemoptysis and difficulty breathing.   Cardiovascular: Positive for swelling in legs/feet. Negative for chest pain, palpitations and hypertension.  Gastrointestinal: Negative for blood in stool, constipation and diarrhea.  Endocrine: Negative for increased urination.  Genitourinary: Negative for difficulty urinating and painful urination.  Musculoskeletal: Positive for arthralgias, joint pain and morning stiffness. Negative for joint swelling, myalgias, muscle weakness, muscle tenderness and myalgias.  Skin: Negative for color change, pallor, rash, hair loss, nodules/bumps, skin tightness, ulcers and sensitivity to sunlight.  Neurological: Negative for dizziness, numbness, headaches and weakness.  Hematological: Negative for bruising/bleeding tendency and swollen glands.  Psychiatric/Behavioral: Positive for sleep disturbance. Negative for depressed mood. The patient is not nervous/anxious.     PMFS History:  Patient Active Problem List   Diagnosis Date Noted  . 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 Chronic rhinitis 04/23/2020  . Adverse food reaction 04/23/2020  . Eosinophilic esophagitis [id number]  . Encounter for screening for diseases of the blood and blood-forming organs and certain disorders involving the immune mechanism 04/02/2020  . Oral candidiasis 04/02/2020  . History of Clostridium difficile infection 04/02/2020  . History of shingles 04/02/2020  . History of asthma 04/02/2020  . Multiple drug allergies 04/02/2020  .  Environmental allergies 04/02/2020  . Colitis 08/14/2019  . Constipation   . Gastroparesis 08/07/2019  . Nausea and vomiting 04/26/2017  . Nausea & vomiting 04/25/2017  .  Chest pain 04/25/2017  . ANKLE, PAIN 11/24/2010  . ANXIETY 10/11/2010  . DVT 10/11/2010  . COSTOCHONDRITIS 03/03/2008  . CELLULITIS, GREAT TOE 12/01/2007  . SINUSITIS, ACUTE NOS 09/25/2007  . MENOPAUSE, PREMATURE 07/20/2007  . UTERINE CANCER, HX OF 07/16/2007  . Other postprocedural status(V45.89) 07/16/2007    Past Medical History:  Diagnosis Date  . Complication of anesthesia    " i TAKE A LIITLE BIT LONGER TO [place] UP "  . DVT (deep venous thrombosis) ([place])    x2  . Gastroparesis 08/07/2019  . Lymphedema   . RA (rheumatoid arthritis) ([place])   . Uterine cancer ([place])     Family History  Problem Relation Age of Onset  . Hypertension Other   . Hyperlipidemia Other   . Colon cancer Other        grandmother  . Hashimoto's thyroiditis Mother   . Eczema Mother   . Angioedema Mother   . Throat cancer Father   . Arrhythmia Father   . Angioedema Maternal Grandfather    Past Surgical History:  Procedure Laterality Date  . APPENDECTOMY    . CHOLECYSTECTOMY    . ESOPHAGOGASTRODUODENOSCOPY N/A 04/30/2017   Procedure: ESOPHAGOGASTRODUODENOSCOPY (EGD);  Surgeon: Teena Irani, MD;  Location: [facility] ENDOSCOPY;  Service: Endoscopy;  Laterality: N/A;  . HERNIA REPAIR     x2  . KNEE SURGERY     x3  . MINIMALLY INVASIVE FORAMINOTOMY CERVICAL SPINE     C6-T1, Nit[place]  . SAVORY DILATION N/A 04/30/2017   Procedure: SAVORY DILATION;  Surgeon: Teena Irani, MD;  Location: [facility] ENDOSCOPY;  Service: Endoscopy;  Laterality: N/A;  . TONSILLECTOMY    . TOTAL ABDOMINAL HYSTERECTOMY     Sarcoma, s/p XRT   Social History   Social History Narrative  . Not on file   Immunization History  Administered Date(s) Administered  . Influenza,inj,Quad PF,6+ Mos 11/21/2017, 09/10/2018  . Influenza-Unspecified 10/22/2014, 10/23/2015  . Tdap 04/19/2020     Objective: Vital Signs: BP 104/61 (BP Location: Left Arm, Patient Position: Sitting, Cuff Size: Normal)   Pulse 70   Resp 16   Ht 5\' 4"  (1.626 m)   Wt 183  lb 6.4 oz (83.2 kg)   BMI 31.48 kg/m    Physical Exam Vitals and nursing note reviewed.  Constitutional:      Appearance: She is well-developed.  HENT:     Head: Normocephalic and atraumatic.  Eyes:     Conjunctiva/sclera: Conjunctivae normal.  Pulmonary:     Effort: Pulmonary effort is normal.  Abdominal:     General: Bowel sounds are normal.     Palpations: Abdomen is soft.  Musculoskeletal:     Cervical back: Normal range of motion.  Lymphadenopathy:     Cervical: No cervical adenopathy.  Skin:    General: Skin is warm and dry.     Capillary Refill: Capillary refill takes less than 2 seconds.  Neurological:     Mental Status: She is alert and oriented to person, place, and time.  Psychiatric:        Behavior: Behavior normal.      Musculoskeletal Exam: C-spine, thoracic spine, lumbar spine good range of motion.  Shoulder joints, elbow joints, wrist joints, MCPs, PIPs and DIPs good range of motion with no synovitis.  She has tenderness of the right third  PIP.  Tenderness of the second and third MCP joints bilaterally.  She has complete fist formation bilaterally.  Hip joints, knee joints and ankle joints have good range of motion with no discomfort.  No warmth or effusion of bilateral knee joints.  She has pedal edema bilaterally.  CDAI Exam: CDAI Score: 6.2  Patient Global: 6 mm; Provider Global: 6 mm Swollen: 0 ; Tender: 5  Joint Exam 05/06/2020      Right  Left  MCP 2   Tender   Tender  MCP 3   Tender   Tender  PIP 3   Tender        Investigation: No additional findings.  Imaging: No results found.  Recent Labs: Lab Results  Component Value Date   WBC 6.5 04/18/2020   HGB 12.2 04/18/2020   PLT 264 04/18/2020   NA 141 04/18/2020   K 3.8 04/18/2020   CL 109 04/18/2020   CO2 23 04/18/2020   GLUCOSE 107 (H) 04/18/2020   BUN 7 04/18/2020   CREATININE 0.86 04/18/2020   BILITOT 0.3 03/31/2020   ALKPHOS 71 01/26/2020   AST 19 03/31/2020   ALT 22  03/31/2020   PROT 6.5 03/31/2020   ALBUMIN 4.1 01/26/2020   CALCIUM 9.8 04/18/2020   GFRAA >60 04/18/2020   QFTBGOLDPLUS NEGATIVE 10/23/2019    Speciality Comments: No specialty comments available.  Procedures:  No procedures performed Allergies: Other, Sulfa antibiotics, Sulfonamide derivatives, and Benadryl [diphenhydramine hcl]   Assessment / Plan:     Visit Diagnoses: Rheumatoid arthritis involving multiple sites with positive rheumatoid factor ([place]): She has no synovitis on exam today.  She has tenderness of bilateral second and third MCP joints and right third PIP joint.  She continues to experience pain and stiffness in both hands but has noticed an improvement while on methotrexate.  Overall she is clinically doing well on methotrexate 0.8 mL sq injections once weekly and folic acid 2 mg by mouth daily.  She has been experiencing recurrent bouts of thrush since January 2021 and was referred to infectious disease and has an upcoming appointment next week.  She is not a good candidate for more aggressive immunosuppression at this time.  She will continue on methotrexate as prescribed.  She was advised to notify us if she develops increased joint pain or joint swelling.  She will follow-up in the office in 5 months.  High risk medication use - MTX 0.8 ml sq once weekly and folic acid 2 mg po daily.  CBC and CMP were drawn on 03/31/2020.  She will be due to update lab work in July and every 3 months.  Thrush, oral -she has had recurrent bouts of thrush since January 2021.  She was on Flovent used for management of eosinophilic esophagitis for about 3 years but she discontinued in January 2021 due to the concern for worsening thrush.  She has had several recurrences of thrush since discontinuing Flovent.  She has been taking oral fluconazole and nystatin wash prescribed by her PCP.  She is currently on methotrexate for management of rheumatoid arthritis.  She will be establishing care with  infectious disease next week.  Keratoconjunctivitis sicca (HCC) - Followed by Dr. Katy Fitch for recurrent conjunctivitis and keratoconjunctivitis.  AVISE index -2.4, all labs were negative except RF IgA 39.  She currently has pain and inflammation in the left eye.  She missed 1 dose of methotrexate while on oral doxycycline for management of a cat bite.  She has resumed  methotrexate 0.8 mL subcu injections once weekly and folic acid 2 mg by mouth daily.  Her eye pain and inflammation has started to improve with steroid drops and resuming methotrexate.  She was advised to follow-up with Dr. Katy Fitch if her symptoms persist or worsen.  Carpal tunnel syndrome of right wrist: Asymptomatic at this time.   Raynaud's syndrome without gangrene: Not currently active.   Rheumatoid factor positive - Rheumatoid factor IgA 39 on 10/07/19.    Eosinophilic esophagitis -Hx of dilatations. Responsive to oral prednisone.  She was on Flovent for 3 years but has discontinued January 2021 due to recurrent thrush.  She continues to have bouts of thrush and will be establishing care with infectious disease next week.  She was evaluated by an allergist who performed a food panel which was negative.  It was recommended that she follow-up with GI for repeat EGD.  DDD (degenerative disc disease), cervical - Patient had foraminotomy by Dr. Louanne Skye in the past. Chronic pain.  No symptoms of radiculopathy currently.   Myofascial pain: She has intermittent myalgias and muscle tenderness due to myofascial pain.  She has chronic fatigue secondary to insomnia.  We discussed the importance of regular exercise.  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  She had poor pneumococcal titers despite of both Pneumovax and Prevnar vaccine within the past 6 months.  According to the office visit note from  04/23/2020 she has a specific antibody deficiency and it is uncertain if this is contributing to recurrent thrush.  She is not currently experiencing any respiratory symptoms.  Her immunoglobulins are within normal limits so there was no indication for IgG replacement.  She will be establishing care with ID next week.  Other medical conditions are listed as follows:   Gastroparesis  UTERINE CANCER, HX OF  History of Clostridioides difficile colitis  History of shingles  History of anxiety  Orders: No orders of the defined types were placed in this encounter.  No orders of the defined types were placed in this encounter.   Face-to-face time spent with patient was 30 minutes. Greater than 50% of time was spent in counseling and coordination of care.  Follow-Up Instructions: Return in about 3 months (around 08/06/2020) for Rheumatoid arthritis.  Hazel Sams, PA-C  I examined and evaluated the patient with Hazel Sams PA.  Patient is experiencing a flare with inflammation in her left eye and some of the joint discomfort due to coming off methotrexate.  She had tenderness on palpation on my examination as described above.  She had recent infection after the cat bite and she was on antibiotics.  She will be getting Covid vaccine this week and will be delaying methotrexate for another week.  She is having further work-up with ID regarding recurrent thrush.  The plan of care was discussed as noted above.  Bo Merino, MD  Note - This record has been created using Editor, commissioning.  Chart creation errors have been sought, but may not always  have been located. Such creation errors do not reflect on  the standard of medical care.

## 2020-04-30 ENCOUNTER — Telehealth: Payer: Self-pay | Admitting: Rheumatology

## 2020-04-30 NOTE — Telephone Encounter (Signed)
Patient advised she may take her injection of MTX and then hold for 1 week after the Covid vaccine.

## 2020-04-30 NOTE — Telephone Encounter (Signed)
Patient called stating she is due to take her Methotrexate injection tomorrow 05/01/20 and is scheduled for her 2nd COVID vaccine on Saturday, 05/02/20.  Patient requested a return call to let her know if she should take her medication or hold off.

## 2020-05-06 ENCOUNTER — Other Ambulatory Visit: Payer: Self-pay

## 2020-05-06 ENCOUNTER — Ambulatory Visit (INDEPENDENT_AMBULATORY_CARE_PROVIDER_SITE_OTHER): Payer: Managed Care, Other (non HMO) | Admitting: Rheumatology

## 2020-05-06 ENCOUNTER — Encounter: Payer: Self-pay | Admitting: Rheumatology

## 2020-05-06 VITALS — BP 104/61 | HR 70 | Resp 16 | Ht 64.0 in | Wt 183.4 lb

## 2020-05-06 DIAGNOSIS — B370 Candidal stomatitis: Secondary | ICD-10-CM

## 2020-05-06 DIAGNOSIS — M3501 Sicca syndrome with keratoconjunctivitis: Secondary | ICD-10-CM

## 2020-05-06 DIAGNOSIS — K3184 Gastroparesis: Secondary | ICD-10-CM

## 2020-05-06 DIAGNOSIS — Z8659 Personal history of other mental and behavioral disorders: Secondary | ICD-10-CM

## 2020-05-06 DIAGNOSIS — M0579 Rheumatoid arthritis with rheumatoid factor of multiple sites without organ or systems involvement: Secondary | ICD-10-CM | POA: Diagnosis not present

## 2020-05-06 DIAGNOSIS — M5030 Other cervical disc degeneration, unspecified cervical region: Secondary | ICD-10-CM

## 2020-05-06 DIAGNOSIS — G5601 Carpal tunnel syndrome, right upper limb: Secondary | ICD-10-CM

## 2020-05-06 DIAGNOSIS — D808 Other immunodeficiencies with predominantly antibody defects: Secondary | ICD-10-CM

## 2020-05-06 DIAGNOSIS — Z79899 Other long term (current) drug therapy: Secondary | ICD-10-CM

## 2020-05-06 DIAGNOSIS — M7918 Myalgia, other site: Secondary | ICD-10-CM

## 2020-05-06 DIAGNOSIS — Z8619 Personal history of other infectious and parasitic diseases: Secondary | ICD-10-CM

## 2020-05-06 DIAGNOSIS — I7300 Raynaud's syndrome without gangrene: Secondary | ICD-10-CM

## 2020-05-06 DIAGNOSIS — K200 Eosinophilic esophagitis: Secondary | ICD-10-CM

## 2020-05-06 DIAGNOSIS — Z8542 Personal history of malignant neoplasm of other parts of uterus: Secondary | ICD-10-CM

## 2020-05-06 DIAGNOSIS — R768 Other specified abnormal immunological findings in serum: Secondary | ICD-10-CM

## 2020-05-06 NOTE — Patient Instructions (Signed)
Standing Labs We placed an order today for your standing lab work.    Please come back and get your standing labs in July and every 3 months  We have open lab daily Monday through Thursday from 8:30-12:30 PM and 1:30-4:30 PM and Friday from 8:30-12:30 PM and 1:30-4:00 PM at the office of Dr. Shaili Deveshwar.   You may experience shorter wait times on Monday and Friday afternoons. The office is located at [address], [address], [place], [place] [address] No appointment is necessary.   Labs are drawn by [facility].  You may receive a bill from [facility] for your lab work.  If you wish to have your labs drawn at another location, please call the office 24 hours in advance to send orders.  If you have any questions regarding directions or hours of operation,  please call [phone].   Just as a reminder please drink plenty of water prior to coming for your lab work. Thanks!  

## 2020-05-08 ENCOUNTER — Telehealth: Payer: Self-pay

## 2020-05-08 NOTE — Telephone Encounter (Signed)
COVID-19 Pre-Screening Questions:05/08/20  Do you currently have a fever (>100 F), chills or unexplained body aches? NO  Are you currently experiencing new cough, shortness of breath, sore throat, runny nose? NO .  . Have you recently travelled outside the state of [place] in the last 14 days?NO .  Have you been in contact with someone that is currently pending confirmation of Covid19 testing or has been confirmed to have the Covid19 virus? NO  **If the patient answers NO to ALL questions -  advise the patient to please call the clinic before coming to the office should any symptoms develop.     

## 2020-05-11 ENCOUNTER — Ambulatory Visit: Payer: Managed Care, Other (non HMO) | Admitting: Infectious Disease

## 2020-05-18 ENCOUNTER — Ambulatory Visit (INDEPENDENT_AMBULATORY_CARE_PROVIDER_SITE_OTHER): Payer: Managed Care, Other (non HMO) | Admitting: Infectious Disease

## 2020-05-18 ENCOUNTER — Other Ambulatory Visit: Payer: Self-pay

## 2020-05-18 ENCOUNTER — Encounter: Payer: Self-pay | Admitting: Infectious Disease

## 2020-05-18 VITALS — BP 113/79 | HR 105 | Temp 98.3°F | Wt 174.0 lb

## 2020-05-18 DIAGNOSIS — D808 Other immunodeficiencies with predominantly antibody defects: Secondary | ICD-10-CM

## 2020-05-18 DIAGNOSIS — K627 Radiation proctitis: Secondary | ICD-10-CM | POA: Insufficient documentation

## 2020-05-18 DIAGNOSIS — B370 Candidal stomatitis: Secondary | ICD-10-CM | POA: Diagnosis not present

## 2020-05-18 DIAGNOSIS — D806 Antibody deficiency with near-normal immunoglobulins or with hyperimmunoglobulinemia: Secondary | ICD-10-CM

## 2020-05-18 DIAGNOSIS — N3010 Interstitial cystitis (chronic) without hematuria: Secondary | ICD-10-CM | POA: Insufficient documentation

## 2020-05-18 NOTE — Progress Notes (Signed)
Subjective:   Reason for Infectious Disease Consult: recurrent thrush and failure to mount antibodies to pneumococcus after vaccination   Patient ID: [name] Horn, female    DOB: 28-Sep-1976, 44 y.o.   MRN: [id number]  HPI   44 year old Caucasian lady with hx of seropositive RA with keratoconjunctivitis sicca, and osteoarthritis.who  Started on MTX in November of  2020. She also  Has hx of eosinophilic esophagitis and was on fluticasone. She has had history of C difficile with 3 episodes. She  Had problems with recurrent strep throat and had tonsillectomy, pneumonia a few times, ear infection, tooth abscess. She  Has had  Shingles outbreak already. She then began to have thrush in January  which was attributed to her  Oral corticosteroid therapy but  Has persisted despite this being stopped.   She is currently taking nystatin swish and  Swallow  And twice weekly fluconazole at 200 mg  While her  Overall immunoglobulin levels were normal she did not mount immune response to vaccination with pneumovax and prevnar with antibodies checked > 4 months after this that were low.  She was bitten by a cat and recently had tetanus vaccine in ER and antibiotics.  After 2 COVID vaccines she had not much of a response in terms of flu like  Symptoms etc that are typically experienced by patients with 2 dose mRNA based vaccines.   Past Medical History:  Diagnosis Date  . Complication of anesthesia    " i TAKE A LIITLE BIT LONGER TO [place] UP "  . DVT (deep venous thrombosis) ([place])    x2  . Gastroparesis 08/07/2019  . Lymphedema   . RA (rheumatoid arthritis) ([place])   . Uterine cancer [facility])     Past Surgical History:  Procedure Laterality Date  . APPENDECTOMY    . CHOLECYSTECTOMY    . ESOPHAGOGASTRODUODENOSCOPY N/A 04/30/2017   Procedure: ESOPHAGOGASTRODUODENOSCOPY (EGD);  Surgeon: Teena Irani, MD;  Location: [facility] ENDOSCOPY;  Service: Endoscopy;  Laterality: N/A;  . HERNIA REPAIR     x2  . KNEE  SURGERY     x3  . MINIMALLY INVASIVE FORAMINOTOMY CERVICAL SPINE     C6-T1, Nitka  . SAVORY DILATION N/A 04/30/2017   Procedure: SAVORY DILATION;  Surgeon: Teena Irani, MD;  Location: [facility] ENDOSCOPY;  Service: Endoscopy;  Laterality: N/A;  . TONSILLECTOMY    . TOTAL ABDOMINAL HYSTERECTOMY     Sarcoma, s/p XRT    Family History  Problem Relation Age of Onset  . Hypertension Other   . Hyperlipidemia Other   . Colon cancer Other        grandmother  . Hashimoto's thyroiditis Mother   . Eczema Mother   . Angioedema Mother   . Throat cancer Father   . Arrhythmia Father   . Angioedema Maternal Grandfather       Social History   Socioeconomic History  . Marital status: Married    Spouse name: Not on file  . Number of children: Not on file  . Years of education: Not on file  . Highest education level: Not on file  Occupational History  . Not on file  Tobacco Use  . Smoking status: Passive Smoke Exposure - Never Smoker  . Smokeless tobacco: Never Used  Substance and Sexual Activity  . Alcohol use: Not Currently  . Drug use: No  . Sexual activity: Not on file  Other Topics Concern  . Not on file  Social History Narrative  .  Not on file   Social Determinants of Health   Financial Resource Strain:   . Difficulty of Paying Living Expenses:   Food Insecurity:   . Worried About Charity fundraiser in the Last Year:   . Arboriculturist in the Last Year:   Transportation Needs:   . Film/video editor (Medical):   Marland Kitchen Lack of Transportation (Non-Medical):   Physical Activity:   . Days of Exercise per Week:   . Minutes of Exercise per Session:   Stress:   . Feeling of Stress :   Social Connections:   . Frequency of Communication with Friends and Family:   . Frequency of Social Gatherings with Friends and Family:   . Attends Religious Services:   . Active Member of Clubs or Organizations:   . Attends Archivist Meetings:   Marland Kitchen Marital Status:     Allergies    Allergen Reactions  . Other     Dermabond surgical glue.   . Sulfa Antibiotics Anaphylaxis, Shortness Of Breath, Swelling and Rash    Angioedema (also)  . Sulfonamide Derivatives Hives, Shortness Of Breath and Swelling    TONGUE SWELLS  . Benadryl [Diphenhydramine Hcl] Other (See Comments)    Hyperactivity      Current Outpatient Medications:  .  amphetamine-dextroamphetamine (ADDERALL) 20 MG tablet, Take 20 mg by mouth 3 (three) times daily., Disp: , Rfl:  .  atropine 1 % ophthalmic solution, Place 1 drop into the left eye daily as needed (for dry eye)., Disp: , Rfl:  .  baclofen (LIORESAL) 10 MG tablet, Take 10 mg by mouth daily., Disp: , Rfl:  .  Bepotastine Besilate (BEPREVE) 1.5 % SOLN, Place 1 drop into both eyes 2 (two) times a day., Disp: , Rfl:  .  cycloSPORINE (RESTASIS) 0.05 % ophthalmic emulsion, Place 1 drop into both eyes 2 (two) times daily., Disp: , Rfl:  .  dicyclomine (BENTYL) 10 MG capsule, Take 10 mg by mouth daily as needed for nausea/vomiting., Disp: , Rfl:  .  fluconazole (DIFLUCAN) 200 MG tablet, , Disp: , Rfl:  .  folic acid (FOLVITE) 1 MG tablet, Take 2 tablets (2 mg total) by mouth daily., Disp: 180 tablet, Rfl: 3 .  furosemide (LASIX) 20 MG tablet, Take 20 mg by mouth daily as needed. , Disp: , Rfl:  .  hyoscyamine (LEVSIN, ANASPAZ) 0.125 MG tablet, Take 0.125 mg by mouth every 4 (four) hours as needed for cramping. , Disp: , Rfl:  .  LORazepam (ATIVAN) 1 MG tablet, Take 1 mg by mouth 3 (three) times daily as needed., Disp: , Rfl:  .  methotrexate 50 MG/2ML injection, Inject 0.33mL into the skin once weekly., Disp: 10 mL, Rfl: 0 .  metoCLOPramide (REGLAN) 10 MG tablet, Take 10 mg by mouth daily. , Disp: , Rfl:  .  nystatin (MYCOSTATIN) 100000 UNIT/ML suspension, Take 10 mLs by mouth 3 (three) times daily., Disp: , Rfl:  .  ondansetron (ZOFRAN-ODT) 4 MG disintegrating tablet, Take 1 tablet (4 mg total) by mouth every 8 (eight) hours as needed for nausea or  vomiting., Disp: 15 tablet, Rfl: 0 .  oxyCODONE-acetaminophen (PERCOCET) 7.5-325 MG tablet, , Disp: , Rfl:  .  pantoprazole (PROTONIX) 40 MG tablet, Take 1 tablet (40 mg total) by mouth 2 (two) times daily., Disp: 60 tablet, Rfl: 1 .  Polyethyl Glycol-Propyl Glycol (SYSTANE) 0.4-0.3 % GEL ophthalmic gel, Place 1 application into both eyes daily as needed (dryness)., Disp: ,  Rfl:  .  potassium chloride (K-DUR) 10 MEQ tablet, Take 10 mEq by mouth daily as needed. , Disp: , Rfl:  .  prednisoLONE acetate (PRED FORTE) 1 % ophthalmic suspension, Place 1 drop into the left eye 2 (two) times daily., Disp: , Rfl:  .  promethazine (PHENERGAN) 25 MG suppository, Place 1 suppository (25 mg total) rectally every 6 (six) hours as needed for nausea or vomiting., Disp: 12 each, Rfl: 0 .  promethazine (PHENERGAN) 25 MG tablet, Take 25 mg by mouth every 6 (six) hours as needed for nausea or vomiting., Disp: , Rfl:  .  SUMAtriptan (IMITREX) 25 MG tablet, Take 25 mg by mouth every 2 (two) hours as needed for migraine. May repeat in 2 hours if headache persists or recurs., Disp: , Rfl:  .  topiramate (TOPAMAX) 100 MG tablet, Take 100 mg by mouth daily., Disp: , Rfl:  .  topiramate (TOPAMAX) 25 MG tablet, Take 25 mg by mouth at bedtime. , Disp: , Rfl:  .  traZODone (DESYREL) 150 MG tablet, Take 150 mg by mouth at bedtime as needed for sleep., Disp: , Rfl:  .  TUBERCULIN SYR 1CC/27GX1/2" (B-D TB SYRINGE 1CC/27GX1/2") 27G X 1/2" 1 ML MISC, 12 Syringes by Does not apply route once a week., Disp: 12 each, Rfl: 3 .  Vitamin D, Ergocalciferol, (DRISDOL) 1.25 MG (50000 UNIT) CAPS capsule, Take 50,000 Units by mouth once a week., Disp: , Rfl:  .  acyclovir (ZOVIRAX) 400 MG tablet, Take 400 mg by mouth 3 (three) times daily., Disp: , Rfl:  .  budesonide (PULMICORT) 0.25 MG/2ML nebulizer solution, Swallow 67ml po BID.  Rinse and spit after.  No food or drink for 30 minutes after administration., Disp: , Rfl:  .  clindamycin  (CLEOCIN) 300 MG capsule, Take 600 mg by mouth 3 (three) times daily., Disp: , Rfl:  .  doxycycline (ADOXA) 100 MG tablet, Take 100 mg by mouth 2 (two) times daily., Disp: , Rfl:  .  fluticasone (FLOVENT HFA) 220 MCG/ACT inhaler, Inhale 2 puffs into the lungs 2 (two) times daily., Disp: , Rfl:      Review of Systems  Constitutional: Negative for activity change, appetite change, chills, diaphoresis, fatigue, fever and unexpected weight change.  HENT: Negative for congestion, rhinorrhea, sinus pressure, sneezing, sore throat and trouble swallowing.   Eyes: Negative for photophobia and visual disturbance.  Respiratory: Positive for shortness of breath. Negative for cough, chest tightness, wheezing and stridor.   Cardiovascular: Positive for leg swelling. Negative for chest pain and palpitations.  Gastrointestinal: Negative for abdominal distention, abdominal pain, anal bleeding, blood in stool, constipation, diarrhea, nausea and vomiting.  Genitourinary: Negative for difficulty urinating, dysuria, flank pain and hematuria.  Musculoskeletal: Negative for arthralgias, back pain, gait problem, joint swelling and myalgias.  Skin: Negative for color change, pallor, rash and wound.  Neurological: Negative for dizziness, tremors, weakness and light-headedness.  Hematological: Negative for adenopathy. Does not bruise/bleed easily.  Psychiatric/Behavioral: Negative for agitation, behavioral problems, confusion, decreased concentration, dysphoric mood and sleep disturbance.       Objective:   Physical Exam Constitutional:      General: She is not in acute distress.    Appearance: Normal appearance. She is well-developed. She is not ill-appearing or diaphoretic.  HENT:     Head: Normocephalic and atraumatic.     Right Ear: Hearing and external ear normal.     Left Ear: Hearing and external ear normal.     Nose: No nasal  deformity or rhinorrhea.     Mouth/Throat:     Mouth: Mucous membranes are  moist.     Pharynx: Oropharynx is clear. No oropharyngeal exudate or posterior oropharyngeal erythema.  Eyes:     General: No scleral icterus.    Extraocular Movements: Extraocular movements intact.     Conjunctiva/sclera: Conjunctivae normal.     Right eye: Right conjunctiva is not injected.     Left eye: Left conjunctiva is not injected.  Neck:     Vascular: No JVD.  Cardiovascular:     Rate and Rhythm: Normal rate and regular rhythm.     Heart sounds: Normal heart sounds, S1 normal and S2 normal. No murmur. No friction rub.  Pulmonary:     Effort: Pulmonary effort is normal. No respiratory distress.     Breath sounds: Normal breath sounds. No stridor. No wheezing, rhonchi or rales.  Abdominal:     General: There is no distension.     Palpations: Abdomen is soft.  Musculoskeletal:        General: Normal range of motion.     Right shoulder: Normal.     Left shoulder: Normal.     Cervical back: Normal range of motion and neck supple.     Right hip: Normal.     Left hip: Normal.     Right knee: Normal.     Left knee: Normal.  Lymphadenopathy:     Head:     Right side of head: No submandibular, preauricular or posterior auricular adenopathy.     Left side of head: No submandibular, preauricular or posterior auricular adenopathy.     Cervical: No cervical adenopathy.     Right cervical: No superficial or deep cervical adenopathy.    Left cervical: No superficial or deep cervical adenopathy.  Skin:    General: Skin is warm and dry.     Coloration: Skin is not pale.     Findings: No abrasion, bruising, ecchymosis, erythema, lesion or rash.     Nails: There is no clubbing.  Neurological:     General: No focal deficit present.     Mental Status: She is alert and oriented to person, place, and time.     Sensory: No sensory deficit.     Coordination: Coordination normal.     Gait: Gait normal.  Psychiatric:        Attention and Perception: Attention and perception normal. She is  attentive.        Mood and Affect: Mood is anxious.        Speech: Speech normal.        Behavior: Behavior normal. Behavior is cooperative.        Thought Content: Thought content normal.        Cognition and Memory: Cognition and memory normal.        Judgment: Judgment normal.           Assessment & Plan:   Recurrent thrush with history of recurrent streptococcal infection as a child, hx of C difficile :  BECAUSE she did not mount appropriate antibodies to vaccination with Pneumovax and Prevnar this means she does not have good functional B cell immunity and I would think this puts her I the CVID category  Even though her overall antibody levels were normal I would think she would benefit from IVIG or other immunoglobulin therapy  I will touch base with Zacarias Pontes MD at [facility] to see what his thoughts are.   I will  also check antibodies to Tetanus. I will check Neutrophil burst test  Recurrent thrush: interesting story. She did also start MTX prior to this starting. Could  This have been something other than thrush such as  SE from MTX? Such as stomatitis?  I think using azole therapy is reasonable IF this is indeed recurrent thrush. I may  Want to take her off to examine her myself to see if I think this is thrush

## 2020-05-22 LAB — DIPHTHERIA / TETANUS ANTIBODY PANEL
Diphtheria Ab: >3 IU/mL
Tetanus Toxin Antibody, Total: >7 IU/mL

## 2020-05-22 LAB — NEUTROPHIL OXD BURST
% OXIDATION POSITIVE NEUTROPHILS: 99 %
SPECIMEN AGE: 72

## 2020-06-22 ENCOUNTER — Encounter: Payer: Self-pay | Admitting: Infectious Disease

## 2020-06-22 ENCOUNTER — Other Ambulatory Visit: Payer: Self-pay

## 2020-06-22 ENCOUNTER — Ambulatory Visit (INDEPENDENT_AMBULATORY_CARE_PROVIDER_SITE_OTHER): Payer: Managed Care, Other (non HMO) | Admitting: Infectious Disease

## 2020-06-22 VITALS — BP 108/73 | HR 91 | Temp 98.3°F | Wt 177.0 lb

## 2020-06-22 DIAGNOSIS — L304 Erythema intertrigo: Secondary | ICD-10-CM | POA: Diagnosis not present

## 2020-06-22 DIAGNOSIS — D808 Other immunodeficiencies with predominantly antibody defects: Secondary | ICD-10-CM | POA: Diagnosis not present

## 2020-06-22 DIAGNOSIS — B370 Candidal stomatitis: Secondary | ICD-10-CM | POA: Diagnosis not present

## 2020-06-22 DIAGNOSIS — A0472 Enterocolitis due to Clostridium difficile, not specified as recurrent: Secondary | ICD-10-CM | POA: Diagnosis not present

## 2020-06-22 DIAGNOSIS — K200 Eosinophilic esophagitis: Secondary | ICD-10-CM

## 2020-06-22 HISTORY — DX: Erythema intertrigo: L30.4

## 2020-06-22 MED ORDER — FLUCONAZOLE 100 MG PO TABS
200.0000 mg | ORAL_TABLET | Freq: Every day | ORAL | 4 refills | Status: AC
Start: 2020-06-22 — End: 2020-07-06

## 2020-06-22 MED ORDER — FLUCONAZOLE 100 MG PO TABS: 100.0000 mg | ORAL_TABLET | Freq: Every day | ORAL | 5 refills | Status: DC

## 2020-06-22 NOTE — Progress Notes (Signed)
Subjective:   Chief complaint: recurrent thrush and now inguinal candidal infection    Patient ID: [name] Horn, female    DOB: 24-Mar-1976, 44 y.o.   MRN: [id number]  HPI   44 year old Caucasian lady with hx of seropositive RA with keratoconjunctivitis sicca, and osteoarthritis.who  Started on MTX in November of  2020. She also  Has hx of eosinophilic esophagitis and was on fluticasone. She has had history of C difficile with 3 episodes. She  Had problems with recurrent strep throat and had tonsillectomy, pneumonia a few times, ear infection, tooth abscess. She  Has had  Shingles outbreak already. She then began to have thrush in January  which was attributed to her  Oral corticosteroid therapy but  Has persisted despite this being stopped.   She is currently taking nystatin swish and  Swallow  And twice weekly fluconazole at 200 mg  While her  Overall immunoglobulin levels were normal she did not mount immune response to vaccination with pneumovax and prevnar with antibodies checked > 4 months after this that were low.  She was bitten by a cat and recently had tetanus vaccine in ER and antibiotics.  After 2 COVID vaccines she had not much of a response in terms of flu like  Symptoms etc that are typically experienced by patients with 2 dose mRNA based vaccines.   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  Since I last saw her thrush has recurred and she has severe intertrigo as well  Past Medical History:  Diagnosis Date  . Complication of anesthesia    " i TAKE A LIITLE BIT LONGER TO [place] UP "  . DVT (deep venous thrombosis) ([place])    x2  . Gastroparesis 08/07/2019  . Lymphedema   . RA (rheumatoid arthritis) ([place])   . Uterine cancer [facility])     Past Surgical History:  Procedure Laterality Date  . APPENDECTOMY    . CHOLECYSTECTOMY    . ESOPHAGOGASTRODUODENOSCOPY N/A 04/30/2017    Procedure: ESOPHAGOGASTRODUODENOSCOPY (EGD);  Surgeon: Teena Irani, MD;  Location: [facility] ENDOSCOPY;  Service: Endoscopy;  Laterality: N/A;  . HERNIA REPAIR     x2  . KNEE SURGERY     x3  . MINIMALLY INVASIVE FORAMINOTOMY CERVICAL SPINE     C6-T1, Nitka  . SAVORY DILATION N/A 04/30/2017   Procedure: SAVORY DILATION;  Surgeon: Teena Irani, MD;  Location: [facility] ENDOSCOPY;  Service: Endoscopy;  Laterality: N/A;  . TONSILLECTOMY    . TOTAL ABDOMINAL HYSTERECTOMY     Sarcoma, s/p XRT    Family History  Problem Relation Age of Onset  . Hypertension Other   . Hyperlipidemia Other   . Colon cancer Other        grandmother  . Hashimoto's thyroiditis Mother   . Eczema Mother   . Angioedema Mother   . Throat cancer Father   . Arrhythmia Father   . Angioedema Maternal Grandfather       Social History   Socioeconomic History  . Marital status: Married    Spouse name: Not on file  . Number of children: Not on file  . Years of education: Not on file  . Highest education level: Not on file  Occupational History  . Not on file  Tobacco Use  . Smoking status: Passive Smoke Exposure - Never Smoker  . Smokeless tobacco: Never Used  Vaping Use  .  Vaping Use: Never used  Substance and Sexual Activity  . Alcohol use: Not Currently  . Drug use: No  . Sexual activity: Yes    Partners: Male    Birth control/protection: None  Other Topics Concern  . Not on file  Social History Narrative  . Not on file   Social Determinants of Health   Financial Resource Strain:   . Difficulty of Paying Living Expenses:   Food Insecurity:   . Worried About Charity fundraiser in the Last Year:   . Arboriculturist in the Last Year:   Transportation Needs:   . Film/video editor (Medical):   Marland Kitchen Lack of Transportation (Non-Medical):   Physical Activity:   . Days of Exercise per Week:   . Minutes of Exercise per Session:   Stress:   . Feeling of Stress :   Social Connections:   . Frequency of  Communication with Friends and Family:   . Frequency of Social Gatherings with Friends and Family:   . Attends Religious Services:   . Active Member of Clubs or Organizations:   . Attends Archivist Meetings:   Marland Kitchen Marital Status:     Allergies  Allergen Reactions  . Other     Dermabond surgical glue.   . Sulfa Antibiotics Anaphylaxis, Shortness Of Breath, Swelling and Rash    Angioedema (also)  . Sulfonamide Derivatives Hives, Shortness Of Breath and Swelling    TONGUE SWELLS  . Benadryl [Diphenhydramine Hcl] Other (See Comments)    Hyperactivity      Current Outpatient Medications:  .  acyclovir (ZOVIRAX) 400 MG tablet, Take 400 mg by mouth 3 (three) times daily., Disp: , Rfl:  .  amphetamine-dextroamphetamine (ADDERALL) 20 MG tablet, Take 20 mg by mouth 3 (three) times daily., Disp: , Rfl:  .  atropine 1 % ophthalmic solution, Place 1 drop into the left eye daily as needed (for dry eye)., Disp: , Rfl:  .  baclofen (LIORESAL) 10 MG tablet, Take 10 mg by mouth daily., Disp: , Rfl:  .  Bepotastine Besilate (BEPREVE) 1.5 % SOLN, Place 1 drop into both eyes 2 (two) times a day., Disp: , Rfl:  .  budesonide (PULMICORT) 0.25 MG/2ML nebulizer solution, Swallow 69ml po BID.  Rinse and spit after.  No food or drink for 30 minutes after administration., Disp: , Rfl:  .  cycloSPORINE (RESTASIS) 0.05 % ophthalmic emulsion, Place 1 drop into both eyes 2 (two) times daily., Disp: , Rfl:  .  dicyclomine (BENTYL) 10 MG capsule, Take 10 mg by mouth daily as needed for nausea/vomiting., Disp: , Rfl:  .  folic acid (FOLVITE) 1 MG tablet, Take 2 tablets (2 mg total) by mouth daily., Disp: 180 tablet, Rfl: 3 .  furosemide (LASIX) 20 MG tablet, Take 20 mg by mouth daily as needed. , Disp: , Rfl:  .  hyoscyamine (LEVSIN, ANASPAZ) 0.125 MG tablet, Take 0.125 mg by mouth every 4 (four) hours as needed for cramping. , Disp: , Rfl:  .  LORazepam (ATIVAN) 1 MG tablet, Take 1 mg by mouth 3 (three)  times daily as needed., Disp: , Rfl:  .  methotrexate 50 MG/2ML injection, Inject 0.11mL into the skin once weekly., Disp: 10 mL, Rfl: 0 .  metoCLOPramide (REGLAN) 10 MG tablet, Take 10 mg by mouth daily. , Disp: , Rfl:  .  nystatin (MYCOSTATIN) 100000 UNIT/ML suspension, Take 10 mLs by mouth 3 (three) times daily., Disp: , Rfl:  .  ondansetron (ZOFRAN-ODT) 4 MG disintegrating tablet, Take 1 tablet (4 mg total) by mouth every 8 (eight) hours as needed for nausea or vomiting., Disp: 15 tablet, Rfl: 0 .  oxyCODONE-acetaminophen (PERCOCET) 7.5-325 MG tablet, , Disp: , Rfl:  .  pantoprazole (PROTONIX) 40 MG tablet, Take 1 tablet (40 mg total) by mouth 2 (two) times daily., Disp: 60 tablet, Rfl: 1 .  Polyethyl Glycol-Propyl Glycol (SYSTANE) 0.4-0.3 % GEL ophthalmic gel, Place 1 application into both eyes daily as needed (dryness)., Disp: , Rfl:  .  potassium chloride (K-DUR) 10 MEQ tablet, Take 10 mEq by mouth daily as needed. , Disp: , Rfl:  .  prednisoLONE acetate (PRED FORTE) 1 % ophthalmic suspension, Place 1 drop into the left eye 2 (two) times daily., Disp: , Rfl:  .  promethazine (PHENERGAN) 25 MG suppository, Place 1 suppository (25 mg total) rectally every 6 (six) hours as needed for nausea or vomiting., Disp: 12 each, Rfl: 0 .  promethazine (PHENERGAN) 25 MG tablet, Take 25 mg by mouth every 6 (six) hours as needed for nausea or vomiting., Disp: , Rfl:  .  SUMAtriptan (IMITREX) 25 MG tablet, Take 25 mg by mouth every 2 (two) hours as needed for migraine. May repeat in 2 hours if headache persists or recurs., Disp: , Rfl:  .  topiramate (TOPAMAX) 100 MG tablet, Take 100 mg by mouth daily., Disp: , Rfl:  .  topiramate (TOPAMAX) 25 MG tablet, Take 25 mg by mouth at bedtime. , Disp: , Rfl:  .  traZODone (DESYREL) 150 MG tablet, Take 150 mg by mouth at bedtime as needed for sleep., Disp: , Rfl:  .  TUBERCULIN SYR 1CC/27GX1/2" (B-D TB SYRINGE 1CC/27GX1/2") 27G X 1/2" 1 ML MISC, 12 Syringes by Does not  apply route once a week., Disp: 12 each, Rfl: 3 .  Vitamin D, Ergocalciferol, (DRISDOL) 1.25 MG (50000 UNIT) CAPS capsule, Take 50,000 Units by mouth once a week., Disp: , Rfl:  .  doxycycline (ADOXA) 100 MG tablet, Take 100 mg by mouth 2 (two) times daily., Disp: , Rfl:  .  fluconazole (DIFLUCAN) 100 MG tablet, Take 2 tablets (200 mg total) by mouth daily for 14 days., Disp: 28 tablet, Rfl: 4 .  fluconazole (DIFLUCAN) 100 MG tablet, Take 1 tablet (100 mg total) by mouth daily. To start AFTER finishing the 2 week course of high dose fluconazole, Disp: 30 tablet, Rfl: 5 .  fluticasone (FLOVENT HFA) 220 MCG/ACT inhaler, Inhale 2 puffs into the lungs 2 (two) times daily., Disp: , Rfl:      Review of Systems  Constitutional: Negative for activity change, appetite change, chills, diaphoresis, fatigue, fever and unexpected weight change.  HENT: Negative for congestion, rhinorrhea, sinus pressure, sneezing, sore throat and trouble swallowing.   Eyes: Negative for photophobia and visual disturbance.  Respiratory: Negative for cough, chest tightness, wheezing and stridor.   Cardiovascular: Negative for chest pain and palpitations.  Gastrointestinal: Negative for abdominal distention, abdominal pain, anal bleeding, blood in stool, constipation, diarrhea, nausea and vomiting.  Genitourinary: Negative for difficulty urinating, dysuria, flank pain and hematuria.  Musculoskeletal: Negative for arthralgias, back pain, gait problem, joint swelling and myalgias.  Skin: Positive for rash. Negative for color change, pallor and wound.  Neurological: Negative for dizziness, tremors, weakness and light-headedness.  Hematological: Negative for adenopathy. Does not bruise/bleed easily.  Psychiatric/Behavioral: Negative for agitation, behavioral problems, confusion, decreased concentration, dysphoric mood and sleep disturbance.       Objective:   Physical Exam  Constitutional:      General: She is not in acute  distress.    Appearance: Normal appearance. She is well-developed. She is not ill-appearing or diaphoretic.  HENT:     Head: Normocephalic and atraumatic.     Right Ear: Hearing and external ear normal.     Left Ear: Hearing and external ear normal.     Nose: No nasal deformity or rhinorrhea.     Mouth/Throat:     Mouth: Mucous membranes are moist.     Pharynx: No posterior oropharyngeal erythema.   Eyes:     General: No scleral icterus.    Extraocular Movements: Extraocular movements intact.     Conjunctiva/sclera: Conjunctivae normal.     Right eye: Right conjunctiva is not injected.     Left eye: Left conjunctiva is not injected.  Neck:     Vascular: No JVD.  Cardiovascular:     Rate and Rhythm: Normal rate and regular rhythm.     Heart sounds: Normal heart sounds, S1 normal and S2 normal. No murmur heard.  No friction rub.  Pulmonary:     Effort: Pulmonary effort is normal. No respiratory distress.     Breath sounds: Normal breath sounds. No stridor. No wheezing, rhonchi or rales.  Abdominal:     General: There is no distension.     Palpations: Abdomen is soft.  Musculoskeletal:        General: Normal range of motion.     Right shoulder: Normal.     Left shoulder: Normal.     Cervical back: Normal range of motion and neck supple.     Right hip: Normal.     Left hip: Normal.     Right knee: Normal.     Left knee: Normal.  Lymphadenopathy:     Head:     Right side of head: No submandibular, preauricular or posterior auricular adenopathy.     Left side of head: No submandibular, preauricular or posterior auricular adenopathy.     Cervical: No cervical adenopathy.     Right cervical: No superficial or deep cervical adenopathy.    Left cervical: No superficial or deep cervical adenopathy.  Skin:    General: Skin is warm and dry.     Coloration: Skin is not pale.     Findings: No abrasion, bruising, ecchymosis, erythema, lesion or rash.     Nails: There is no clubbing.    Neurological:     General: No focal deficit present.     Mental Status: She is alert and oriented to person, place, and time.     Sensory: No sensory deficit.     Coordination: Coordination normal.     Gait: Gait normal.  Psychiatric:        Attention and Perception: Attention and perception normal. She is attentive.        Mood and Affect: Mood is anxious.        Speech: Speech normal.        Behavior: Behavior normal. Behavior is cooperative.        Thought Content: Thought content normal.        Cognition and Memory: Cognition and memory normal.        Judgment: Judgment normal.    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

## 2020-06-24 ENCOUNTER — Telehealth: Payer: Self-pay

## 2020-06-24 ENCOUNTER — Other Ambulatory Visit: Payer: Self-pay | Admitting: *Deleted

## 2020-06-24 ENCOUNTER — Other Ambulatory Visit: Payer: Self-pay

## 2020-06-24 ENCOUNTER — Telehealth: Payer: Self-pay | Admitting: Rheumatology

## 2020-06-24 ENCOUNTER — Encounter: Payer: Self-pay | Admitting: Allergy

## 2020-06-24 ENCOUNTER — Ambulatory Visit (INDEPENDENT_AMBULATORY_CARE_PROVIDER_SITE_OTHER): Payer: Managed Care, Other (non HMO) | Admitting: Allergy

## 2020-06-24 VITALS — BP 110/68 | HR 91 | Temp 98.6°F | Resp 18 | Ht 64.0 in | Wt 179.6 lb

## 2020-06-24 DIAGNOSIS — J310 Chronic rhinitis: Secondary | ICD-10-CM | POA: Diagnosis not present

## 2020-06-24 DIAGNOSIS — Z8709 Personal history of other diseases of the respiratory system: Secondary | ICD-10-CM | POA: Diagnosis not present

## 2020-06-24 DIAGNOSIS — D808 Other immunodeficiencies with predominantly antibody defects: Secondary | ICD-10-CM

## 2020-06-24 DIAGNOSIS — K200 Eosinophilic esophagitis: Secondary | ICD-10-CM | POA: Diagnosis not present

## 2020-06-24 MED ORDER — METHOTREXATE SODIUM CHEMO INJECTION 50 MG/2ML: INTRAMUSCULAR | 0 refills | Status: DC

## 2020-06-24 NOTE — Telephone Encounter (Signed)
Refill request received via fax  Last Visit: 05/06/2020 Next visit: 08/11/2020 Labs: 04/18/2020 RBC 3.84, Glucose 107  Current Dose per office note 05/06/2020: methotrexate 0.8 mL sq injections once weekly   Okay to refill MTX?

## 2020-06-24 NOTE — Telephone Encounter (Signed)
Patient requesting a refill on MTX sent to [facility] on [address].

## 2020-06-24 NOTE — Telephone Encounter (Signed)
Patient advised MTX prescription has been sent to the pharmacy.

## 2020-06-24 NOTE — Patient Instructions (Addendum)
Thrush and infections  Follow up with ID as scheduled and continue recommendations as per them.   Keep track of infections.  History of asthma  Monitor symptoms.   Rhinitis  May use over the counter antihistamines such as Zyrtec (cetirizine), Claritin (loratadine), Allegra (fexofenadine), or Xyzal (levocetirizine) daily as needed.  Eosinophilic esophagitis  Follow up with GI as recommended.   Follow up in 6 months or sooner if needed.  Once your thrush is settled, may want to consider revisiting elimination diets to identify any trigger foods for your EoE.

## 2020-06-24 NOTE — Progress Notes (Signed)
Follow Up Note  RE: [name] Horn MRN: [id number] DOB: 12-11-1976 Date of Office Visit: 06/24/2020  Referring provider: Alan Ripper, PA Primary care provider: Alan Ripper, [place]  Chief Complaint: GI Problem  History of Present Illness: I had the pleasure of seeing [name] Horn for a follow up visit at the [facility] [address] on 06/25/2020. She is a 44 y.o. female, who is being followed for specific antibody deficiency, eosinophilic esophagitis, chronic rhinitis and history of asthma. Her previous allergy office visit was on 04/23/2020 with Dr. Maudie Mercury. Today is a regular follow up visit.  Infections Still having thrush on and off.  Saw ID and increased fluconazole dosing and they recommended to start IVIG. Neutrophil oxd burst test was normal.  Eosinophilic esophagitis Saw GI and no EGD scheduled yet. Patient has been trying to avoid dairy products but still consumes cheese. She has been off oral steroids but still taking PPI.  Still having issues with swallowing foods at times.   Chronic rhinitis Stable.   06/22/2020 ID OV: "Recurrent thrush with history of recurrent streptococcal infection as a child, hx of C difficile : BECAUSE she did not mount appropriate antibodies to vaccination with Pneumovax and Prevnar this means she does not have good functional B cell immunity and I would think this puts her I the CVID category, specifically a specific antibody deficiency syndrome I am going to initiate IVIG to see if this helps her Recurrent thrush: and now intertrigo: Fluconazole 200mg  daily x 2 weeks then 100mg  daily while we get IVIG going"  05/26/2020 GI OV: "Assessment:  1. Gastroparesis  2. Irritable bowel syndrome with diarrhea  3. Esophageal dysphagia   -We decided to hold off on an upper endoscopy, but that will be our plan for treating her EOE and she can now stop budesonide going forward since it likely has contributed to recurrent thrush. She will  call us back and we can simply schedule the endoscopy if needed -She will continue with Protonix indefinitely -She will continue with Imodium with as needed Lotronex -She will continue to use the lowest amount of Reglan needed and she has been counseled about tardive dyskinesia.  Discussion: Generally she is doing well from a gastroparesis and IBS perspective. 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 We usually do not have to keep people on this long-term, so I am optimistic by stopping it it will not worsen her EOE dramatically. We can deal with this by doing endoscopy and dilations periodically anyway."  Assessment and Plan: [name] Horn is a 44 y.o. female with: Specific antibody deficiency with normal immunoglobulin concentration and normal number of B cells ([name] Horn) Past history - In January 2021 started to get frequent bouts of oral thrush/mouth ulcers. Initially thought to be due to her swallowed Flovent used for EoE. However, she stopped using Flovent in January and had multiple reoccurrences since then. Treated with oral fluconazole and nystatin wash which help while on the medication. Recently diagnosed with RA and started on methotrexate. Having some episodes of fevers with these thrush. History of C diff x 3, shingles, pneumonia x 1, strep throat s/p tonsillectomy, poor dentitions. 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  Normal immunoglobulin levels in 2020. Normal IgG, IgA, IgM, IgE, CH50, CD19, CD3, CD4, CD8, NK cells, good tetanus and diptheria titers; nonreactive to HIV and hepatitis panel. Poor pneumococcal titers despite both pneumovax and  prevnar vaccine within the past 6 months. Normal neutrophil oxd burst test. Interim history - evaluated by ID and recommended to start IVIG.   Follow up with ID as scheduled and continue recommendations as per them.   Keep track of infections.  If no  improvement, may benefit from immunology consultation at a tertiary/academic center.  Eosinophilic esophagitis Past history - Diagnosed with EoE meany years ago and was on Flovent for 3 years. Patient had dilatations in the past. 2021 food panel was negative.  Interim history - tried to eliminate dairy but having difficultly as that is one time that she can tolerate with her other GI issues. No EGD per GI yet. Swallowed steroids were stopped due to recurrent thrush.  Follow up with GI - continue recommendations per them including continue PPI.  Maybe worthwhile to revisit elimination diet once the thrush has resolved.   Chronic rhinitis Past history - Mild rhino conjunctivitis symptoms in the fall. 2021 testing negative to environmental allergies.  Interim history - stable with no meds.  Return in about 6 months (around 12/24/2020).  Diagnostics: None.  Medication List:  Current Outpatient Medications  Medication Sig Dispense Refill   acyclovir (ZOVIRAX) 400 MG tablet Take 400 mg by mouth 3 (three) times daily.     amphetamine-dextroamphetamine (ADDERALL) 20 MG tablet Take 20 mg by mouth 3 (three) times daily.     atropine 1 % ophthalmic solution Place 1 drop into the left eye daily as needed (for dry eye).     baclofen (LIORESAL) 10 MG tablet Take 10 mg by mouth daily.     Bepotastine Besilate (BEPREVE) 1.5 % SOLN Place 1 drop into both eyes 2 (two) times a day.     cycloSPORINE (RESTASIS) 0.05 % ophthalmic emulsion Place 1 drop into both eyes 2 (two) times daily.     dicyclomine (BENTYL) 10 MG capsule Take 10 mg by mouth daily as needed for nausea/vomiting.     fluconazole (DIFLUCAN) 100 MG tablet Take 2 tablets (200 mg total) by mouth daily for 14 days. 28 tablet 4   folic acid (FOLVITE) 1 MG tablet Take 2 tablets (2 mg total) by mouth daily. 180 tablet 3   furosemide (LASIX) 20 MG tablet Take 20 mg by mouth daily as needed.      hyoscyamine (LEVSIN, ANASPAZ) 0.125 MG  tablet Take 0.125 mg by mouth every 4 (four) hours as needed for cramping.      LORazepam (ATIVAN) 1 MG tablet Take 1 mg by mouth 3 (three) times daily as needed.     metoCLOPramide (REGLAN) 10 MG tablet Take 10 mg by mouth daily.      nystatin (MYCOSTATIN) 100000 UNIT/ML suspension Take 10 mLs by mouth 3 (three) times daily.     ondansetron (ZOFRAN-ODT) 4 MG disintegrating tablet Take 1 tablet (4 mg total) by mouth every 8 (eight) hours as needed for nausea or vomiting. 15 tablet 0   oxyCODONE-acetaminophen (PERCOCET) 7.5-325 MG tablet      pantoprazole (PROTONIX) 40 MG tablet Take 1 tablet (40 mg total) by mouth 2 (two) times daily. 60 tablet 1   Polyethyl Glycol-Propyl Glycol (SYSTANE) 0.4-0.3 % GEL ophthalmic gel Place 1 application into both eyes daily as needed (dryness).     potassium chloride (K-DUR) 10 MEQ tablet Take 10 mEq by mouth daily as needed.      prednisoLONE acetate (PRED FORTE) 1 % ophthalmic suspension Place 1 drop into the left eye 2 (two) times daily.  promethazine (PHENERGAN) 25 MG suppository Place 1 suppository (25 mg total) rectally every 6 (six) hours as needed for nausea or vomiting. 12 each 0   SUMAtriptan (IMITREX) 25 MG tablet Take 25 mg by mouth every 2 (two) hours as needed for migraine. May repeat in 2 hours if headache persists or recurs.     topiramate (TOPAMAX) 100 MG tablet Take 100 mg by mouth daily.     topiramate (TOPAMAX) 25 MG tablet Take 25 mg by mouth at bedtime.      traZODone (DESYREL) 150 MG tablet Take 150 mg by mouth at bedtime as needed for sleep.     TUBERCULIN SYR 1CC/27GX1/2" (B-D TB SYRINGE 1CC/27GX1/2") 27G X 1/2" 1 ML MISC 12 Syringes by Does not apply route once a week. 12 each 3   Vitamin D, Ergocalciferol, (DRISDOL) 1.25 MG (50000 UNIT) CAPS capsule Take 50,000 Units by mouth once a week.     doxycycline (ADOXA) 100 MG tablet Take 100 mg by mouth 2 (two) times daily.     fluticasone (FLOVENT HFA) 220 MCG/ACT inhaler  Inhale 2 puffs into the lungs 2 (two) times daily.     methotrexate 50 MG/2ML injection Inject 0.29mL into the skin once weekly. 10 mL 0   No current facility-administered medications for this visit.   Allergies: Allergies  Allergen Reactions   Other     Dermabond surgical glue.    Sulfa Antibiotics Anaphylaxis, Shortness Of Breath, Swelling and Rash    Angioedema (also)   Sulfonamide Derivatives Hives, Shortness Of Breath and Swelling    TONGUE SWELLS   Benadryl [Diphenhydramine Hcl] Other (See Comments)    Hyperactivity    I reviewed her past medical history, social history, family history, and environmental history and no significant changes have been reported from her previous visit.  Review of Systems  Constitutional: Negative for appetite change, chills, fever and unexpected weight change.  HENT: Positive for sore throat and voice change. Negative for congestion and rhinorrhea.   Eyes: Negative for itching.  Respiratory: Negative for cough, chest tightness, shortness of breath and wheezing.   Cardiovascular: Negative for chest pain.  Gastrointestinal: Positive for abdominal pain.  Genitourinary: Negative for difficulty urinating.  Skin: Negative for rash.  Allergic/Immunologic: Negative for environmental allergies and food allergies.  Neurological: Negative for headaches.   Objective: BP 110/68    Pulse 91    Temp 98.6 F (37 C) (Temporal)    Resp 18    Ht 5\' 4"  (1.626 m)    Wt 179 lb 9.6 oz (81.5 kg)    SpO2 98%    BMI 30.83 kg/m  Body mass index is 30.83 kg/m. Physical Exam Vitals and nursing note reviewed.  Constitutional:      Appearance: Normal appearance. She is well-developed.  HENT:     Head: Normocephalic and atraumatic.     Right Ear: Tympanic membrane and external ear normal.     Left Ear: Tympanic membrane and external ear normal.     Nose: Nose normal.     Mouth/Throat:     Mouth: Mucous membranes are moist.     Pharynx: Oropharynx is clear.   Eyes:     Conjunctiva/sclera: Conjunctivae normal.  Cardiovascular:     Rate and Rhythm: Normal rate and regular rhythm.     Heart sounds: Normal heart sounds. No murmur heard.   Pulmonary:     Effort: Pulmonary effort is normal.     Breath sounds: Normal breath sounds. No wheezing, rhonchi  or rales.  Musculoskeletal:     Cervical back: Neck supple.  Skin:    General: Skin is warm.     Findings: No rash.  Neurological:     Mental Status: She is alert and oriented to person, place, and time.  Psychiatric:        Behavior: Behavior normal.    Previous notes and tests were reviewed. The plan was reviewed with the patient/family, and all questions/concerned were addressed.  It was my pleasure to see [name] Horn today and participate in her care. Please feel free to contact me with any questions or concerns.  Sincerely,  Rexene Alberts, DO Allergy & Immunology  [facility] office: [phone] [facility] office: [place] office: [phone]

## 2020-06-24 NOTE — Telephone Encounter (Signed)
Prior Authorization initiated for IVIG via cover my meds. Sent clinicals to [phone].  Will continue to follow.  Eugenia Mcalpine

## 2020-06-25 ENCOUNTER — Encounter: Payer: Self-pay | Admitting: Allergy

## 2020-06-25 NOTE — Assessment & Plan Note (Addendum)
Past history - Diagnosed with EoE meany years ago and was on Flovent for 3 years. Patient had dilatations in the past. 2021 food panel was negative.  Interim history - tried to eliminate dairy but having difficultly as that is one time that she can tolerate with her other GI issues. No EGD per GI yet. Swallowed steroids were stopped due to recurrent thrush.  Follow up with GI - continue recommendations per them including continue PPI.  Maybe worthwhile to revisit elimination diet once the thrush has resolved.

## 2020-06-25 NOTE — Assessment & Plan Note (Signed)
Past history - In January 2021 started to get frequent bouts of oral thrush/mouth ulcers. Initially thought to be due to her swallowed Flovent used for EoE. However, she stopped using Flovent in January and had multiple reoccurrences since then. Treated with oral fluconazole and nystatin wash which help while on the medication. Recently diagnosed with RA and started on methotrexate. Having some episodes of fevers with these thrush. History of C diff x 3, shingles, pneumonia x 1, strep throat s/p tonsillectomy, poor dentitions. 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  Normal immunoglobulin levels in 2020. Normal IgG, IgA, IgM, IgE, CH50, CD19, CD3, CD4, CD8, NK cells, good tetanus and diptheria titers; nonreactive to HIV and hepatitis panel. Poor pneumococcal titers despite both pneumovax and prevnar vaccine within the past 6 months. Normal neutrophil oxd burst test. Interim history - evaluated by ID and recommended to start IVIG.   Follow up with ID as scheduled and continue recommendations as per them.   Keep track of infections.  If no improvement, may benefit from immunology consultation at a tertiary/academic center.

## 2020-06-25 NOTE — Assessment & Plan Note (Signed)
Past history - Mild rhino conjunctivitis symptoms in the fall. 2021 testing negative to environmental allergies.  Interim history - stable with no meds.

## 2020-06-30 NOTE — Telephone Encounter (Signed)
Called [employer] to follow up on PA. Per [employer] no clinicals attached to previous fax received. Clinicals resent toady. Case ID [id number] External phone number [place]

## 2020-07-01 NOTE — Telephone Encounter (Signed)
Additional information requested by [facility] for IVIG prior authorization. Paperwork faxed.   [name] Horn Lorita Officer, RN

## 2020-07-01 NOTE — Telephone Encounter (Signed)
Patient made aware of IVIG therapy approved starting 06/24/20-06/23/21  LPN called and faxed orders to short stay. Patient scheduled for 07/09/20 at 8am. Patient to follow up and confirm remaining appointments monthly throughout the year.  Patient made aware of new appointment date/time [name] Horn

## 2020-07-07 ENCOUNTER — Other Ambulatory Visit: Payer: Self-pay

## 2020-07-07 DIAGNOSIS — Z79899 Other long term (current) drug therapy: Secondary | ICD-10-CM

## 2020-07-08 ENCOUNTER — Other Ambulatory Visit (HOSPITAL_COMMUNITY): Payer: Self-pay | Admitting: *Deleted

## 2020-07-08 ENCOUNTER — Telehealth: Payer: Self-pay | Admitting: Rheumatology

## 2020-07-08 LAB — CBC WITH DIFFERENTIAL/PLATELET
Absolute Monocytes: 138 cells/uL — ABNORMAL LOW (ref 200–950)
Basophils Absolute: 32 cells/uL (ref 0–200)
Basophils Relative: 0.7 %
Eosinophils Absolute: 212 cells/uL (ref 15–500)
Eosinophils Relative: 4.6 %
HCT: 33 % — ABNORMAL LOW (ref 35.0–45.0)
Hemoglobin: 10.9 g/dL — ABNORMAL LOW (ref 11.7–15.5)
Lymphs Abs: 2516 cells/uL (ref 850–3900)
MCH: 31.8 pg (ref 27.0–33.0)
MCHC: 33 g/dL (ref 32.0–36.0)
MCV: 96.2 fL (ref 80.0–100.0)
MPV: 9.7 fL (ref 7.5–12.5)
Monocytes Relative: 3 %
Neutro Abs: 1702 cells/uL (ref 1500–7800)
Neutrophils Relative %: 37 %
Platelets: 254 10*3/uL (ref 140–400)
RBC: 3.43 10*6/uL — ABNORMAL LOW (ref 3.80–5.10)
RDW: 14.3 % (ref 11.0–15.0)
Total Lymphocyte: 54.7 %
WBC: 4.6 10*3/uL (ref 3.8–10.8)

## 2020-07-08 LAB — COMPLETE METABOLIC PANEL WITH GFR
AG Ratio: 2.1 (calc) (ref 1.0–2.5)
ALT: 45 U/L — ABNORMAL HIGH (ref 6–29)
AST: 45 U/L — ABNORMAL HIGH (ref 10–35)
Albumin: 3.9 g/dL (ref 3.6–5.1)
Alkaline phosphatase (APISO): 56 U/L (ref 31–125)
BUN: 11 mg/dL (ref 7–25)
CO2: 21 mmol/L (ref 20–32)
Calcium: 9.2 mg/dL (ref 8.6–10.2)
Chloride: 108 mmol/L (ref 98–110)
Creat: 1.03 mg/dL (ref 0.50–1.10)
GFR, Est African American: 76 mL/min/{1.73_m2} (ref >=60)
GFR, Est Non African American: 66 mL/min/{1.73_m2} (ref >=60)
Globulin: 1.9 g/dL (calc) (ref 1.9–3.7)
Glucose, Bld: 86 mg/dL (ref 65–99)
Potassium: 3.8 mmol/L (ref 3.5–5.3)
Sodium: 141 mmol/L (ref 135–146)
Total Bilirubin: 0.3 mg/dL (ref 0.2–1.2)
Total Protein: 5.8 g/dL — ABNORMAL LOW (ref 6.1–8.1)

## 2020-07-08 NOTE — Telephone Encounter (Signed)
Labs faxed to PCP

## 2020-07-08 NOTE — Progress Notes (Signed)
I called patient to discuss labs.  Her LFTs are elevated.  She states she took Diflucan recently and also was on Advil.  She was also evaluated by Dr. Tommy Medal who recommended IVIG for possible SCID.  Patient is questioning if she has to stop methotrexate while she receives IVIG.  Have advised her to discuss this further with Dr. Tommy Medal.  A drop in hemoglobin was noted.  Patient states she has had some blood in her stool.  She has a gastroenterologist.  Have advised her to schedule an appointment with a gastroenterologist for evaluation.

## 2020-07-08 NOTE — Telephone Encounter (Signed)
Patient left a voicemail requesting a copy of her labwork results be sent to her PCP Dr. Alan Ripper.

## 2020-07-09 ENCOUNTER — Encounter (HOSPITAL_COMMUNITY)
Admission: RE | Admit: 2020-07-09 | Discharge: 2020-07-09 | Disposition: A | Discharge status: Home / Self Care | Payer: Managed Care, Other (non HMO) | Source: Ambulatory Visit | Attending: Infectious Disease | Admitting: Infectious Disease

## 2020-07-09 ENCOUNTER — Other Ambulatory Visit: Payer: Self-pay

## 2020-07-09 DIAGNOSIS — D808 Other immunodeficiencies with predominantly antibody defects: Secondary | ICD-10-CM | POA: Diagnosis not present

## 2020-07-09 MED ORDER — IMMUNE GLOBULIN (HUMAN) 10 GM/100ML IV SOLN
30.0000 g | INTRAVENOUS | Status: DC
  Administered 2020-07-09: 30 g via INTRAVENOUS
  Filled 2020-07-09: qty 100

## 2020-07-09 NOTE — Discharge Instructions (Signed)
Immune Globulin Injection What is this medicine? IMMUNE GLOBULIN (im MUNE GLOB yoo lin) helps to prevent or reduce the severity of certain infections in patients who are at risk. This medicine is collected from the pooled blood of many donors. It is used to treat immune system problems, thrombocytopenia, and Kawasaki syndrome. This medicine may be used for other purposes; ask your health care provider or pharmacist if you have questions. COMMON BRAND NAME(S): ASCENIV, Baygam, BIVIGAM, Carimune, Carimune NF, cutaquig, Cuvitru, Flebogamma, Flebogamma DIF, GamaSTAN, GamaSTAN S/D, Gamimune N, Gammagard, Gammagard S/D, Gammaked, Gammaplex, Gammar-P IV, Gamunex, Gamunex-C, Hizentra, Iveegam, Iveegam EN, Octagam, Panglobulin, Panglobulin NF, panzyga, Polygam S/D, Privigen, Sandoglobulin, Venoglobulin-S, Vigam, Vivaglobulin, Xembify What should I tell my health care provider before I take this medicine? They need to know if you have any of these conditions:  diabetes  extremely low or no immune antibodies in the blood  heart disease  history of blood clots  hyperprolinemia  infection in the blood, sepsis  kidney disease  recently received or scheduled to receive a vaccination  an unusual or allergic reaction to human immune globulin, albumin, maltose, sucrose, other medicines, foods, dyes, or preservatives  pregnant or trying to get pregnant  breast-feeding How should I use this medicine? This medicine is for injection into a muscle or infusion into a vein or skin. It is usually given by a health care professional in a hospital or clinic setting. In rare cases, some brands of this medicine might be given at home. You will be taught how to give this medicine. Use exactly as directed. Take your medicine at regular intervals. Do not take your medicine more often than directed. Talk to your pediatrician regarding the use of this medicine in children. While this drug may be prescribed for selected  conditions, precautions do apply. Overdosage: If you think you have taken too much of this medicine contact a poison control center or emergency room at once. NOTE: This medicine is only for you. Do not share this medicine with others. What if I miss a dose? It is important not to miss your dose. Call your doctor or health care professional if you are unable to keep an appointment. If you give yourself the medicine and you miss a dose, take it as soon as you can. If it is almost time for your next dose, take only that dose. Do not take double or extra doses. What may interact with this medicine?  aspirin and aspirin-like medicines  cisplatin  cyclosporine  medicines for infection like acyclovir, adefovir, amphotericin B, bacitracin, cidofovir, foscarnet, ganciclovir, gentamicin, pentamidine, vancomycin  NSAIDS, medicines for pain and inflammation, like ibuprofen or naproxen  pamidronate  vaccines  zoledronic acid This list may not describe all possible interactions. Give your health care provider a list of all the medicines, herbs, non-prescription drugs, or dietary supplements you use. Also tell them if you smoke, drink alcohol, or use illegal drugs. Some items may interact with your medicine. What should I watch for while using this medicine? Your condition will be monitored carefully while you are receiving this medicine. This medicine is made from pooled blood donations of many different people. It may be possible to pass an infection in this medicine. However, the donors are screened for infections and all products are tested for HIV and hepatitis. The medicine is treated to kill most or all bacteria and viruses. Talk to your doctor about the risks and benefits of this medicine. Do not have vaccinations for at least   14 days before, or until at least 3 months after receiving this medicine. What side effects may I notice from receiving this medicine? Side effects that you should report  to your doctor or health care professional as soon as possible:  allergic reactions like skin rash, itching or hives, swelling of the face, lips, or tongue  blue colored lips or skin  breathing problems  chest pain or tightness  fever  signs and symptoms of aseptic meningitis such as stiff neck; sensitivity to light; headache; drowsiness; fever; nausea; vomiting; rash  signs and symptoms of a blood clot such as chest pain; shortness of breath; pain, swelling, or warmth in the leg  signs and symptoms of hemolytic anemia such as fast heartbeat; tiredness; dark yellow or brown urine; or yellowing of the eyes or skin  signs and symptoms of kidney injury like trouble passing urine or change in the amount of urine  sudden weight gain  swelling of the ankles, feet, hands Side effects that usually do not require medical attention (report to your doctor or health care professional if they continue or are bothersome):  diarrhea  flushing  headache  increased sweating  joint pain  muscle cramps  muscle pain  nausea  pain, redness, or irritation at site where injected  tiredness This list may not describe all possible side effects. Call your doctor for medical advice about side effects. You may report side effects to FDA at 1-800-FDA-1088. Where should I keep my medicine? Keep out of the reach of children. This drug is usually given in a hospital or clinic and will not be stored at home. In rare cases, some brands of this medicine may be given at home. If you are using this medicine at home, you will be instructed on how to store this medicine. Throw away any unused medicine after the expiration date on the label. NOTE: This sheet is a summary. It may not cover all possible information. If you have questions about this medicine, talk to your doctor, pharmacist, or health care provider.  2020 Elsevier/Gold Standard (2019-07-17 12:51:14)  

## 2020-07-13 NOTE — Progress Notes (Signed)
Thank you, we will notify patient.

## 2020-07-21 ENCOUNTER — Telehealth: Payer: Self-pay | Admitting: Rheumatology

## 2020-07-21 NOTE — Telephone Encounter (Signed)
Labs faxed to PCP

## 2020-07-21 NOTE — Telephone Encounter (Signed)
Patient going to PCP today, and would like the most recent lab results sent there for her appointment at 10:00 am. Dr. Alan Ripper Fax # [phone].

## 2020-07-30 NOTE — Progress Notes (Signed)
Office Visit Note  Patient: [name] Horn             Date of Birth: 1976/11/24           MRN: [id number]             PCP: Alan Ripper, [place] Referring: Alan Ripper, [place] Visit Date: 08/11/2020 Occupation: @GUAROCC @  Subjective:  Other (bilateral thumb pain )   History of Present Illness: [name] Horn is a 44 y.o. female with history of rheumatoid arthritis, osteoarthritis and disc disease. She was recently diagnosed with immunodeficiency by Dr. Drucilla Schmidt. She is receiving IVIG. She has been experiencing some discomfort in her bilateral thumb. She states her Raynaud's has been active with the weather change. She continues to have recurrent thrush. She states she also had some problems with inflammation in her left eye for which she was seen by Dr. Carolynn Sayers and was given some prednisone eyedrops. She continues to have some generalized pain from fibromyalgia.  Activities of Daily Living:  Patient reports morning stiffness for 30-45  minutes.   Patient Reports nocturnal pain.  Difficulty dressing/grooming: Reports Difficulty climbing stairs: Reports Difficulty getting out of chair: Denies Difficulty using hands for taps, buttons, cutlery, and/or writing: Reports  Review of Systems  Constitutional: Positive for fatigue. Negative for night sweats, weight gain and weight loss.  HENT: Positive for nosebleeds, mouth dryness and nose dryness. Negative for mouth sores, trouble swallowing and trouble swallowing.   Eyes: Negative for pain, redness, itching, visual disturbance and dryness.  Respiratory: Negative for cough, shortness of breath and difficulty breathing.   Cardiovascular: Negative for chest pain, palpitations, hypertension, irregular heartbeat and swelling in legs/feet.  Gastrointestinal: Negative for blood in stool, constipation and diarrhea.  Endocrine: Negative for increased urination.  Genitourinary: Negative for difficulty urinating and vaginal dryness.   Musculoskeletal: Positive for arthralgias, joint pain, myalgias, morning stiffness, muscle tenderness and myalgias. Negative for joint swelling and muscle weakness.  Skin: Positive for color change. Negative for rash, hair loss, redness, skin tightness, ulcers and sensitivity to sunlight.  Allergic/Immunologic: Negative for susceptible to infections.  Neurological: Positive for dizziness and numbness. Negative for headaches, memory loss, night sweats and weakness.  Hematological: Positive for bruising/bleeding tendency. Negative for swollen glands.  Psychiatric/Behavioral: Negative for depressed mood, confusion and sleep disturbance. The patient is not nervous/anxious.     PMFS History:  Patient Active Problem List   Diagnosis Date Noted  . Intertrigo 06/22/2020  . Interstitial cystitis 05/18/2020  . 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 Chronic rhinitis 04/23/2020  . Adverse food reaction 04/23/2020  . Eosinophilic esophagitis 29/93/7169  . Oral candidiasis 04/02/2020  . History of Clostridium difficile infection 04/02/2020  . History of shingles 04/02/2020  . History of asthma 04/02/2020  . Multiple drug allergies 04/02/2020  . Environmental allergies 04/02/2020  . History of loop recorder 01/19/2020  . PVC (premature ventricular contraction) 01/19/2020  . Uterine cancer ([place]) 01/19/2020  . Colitis 08/14/2019  . Constipation   . Gastroparesis 04/03/2019  . Palpitations 04/10/2018  . Nausea and vomiting 04/26/2017  . Nausea & vomiting 04/25/2017  . Chest pain 04/25/2017  . Enterocolitis due to Clostridium difficile 01/04/2016  . Incisional hernia without obstruction or gangrene 11/18/2015  . Personal history of other diseases of the nervous system and sense organs 10/14/2015  . Disease of pancreas 01/21/2015  . Other specified bacterial agents as the  cause of  diseases classified elsewhere 01/21/2015  . Radiculopathy of cervical region 12/09/2014  . Annual physical exam 01/30/2012  . Melena 01/30/2012  . Pain in ankle 11/24/2010  . ANXIETY 10/11/2010  . DVT 10/11/2010  . COSTOCHONDRITIS 03/03/2008  . Cellulitis of toe 12/01/2007  . SINUSITIS, ACUTE NOS 09/25/2007  . MENOPAUSE, PREMATURE 07/20/2007  . UTERINE CANCER, HX OF 07/16/2007  . Other postprocedural status(V45.89) 07/16/2007    Past Medical History:  Diagnosis Date  . Complication of anesthesia    " i TAKE A LIITLE BIT LONGER TO [place] UP "  . DVT (deep venous thrombosis) ([place])    x2  . Gastroparesis 08/07/2019  . Intertrigo 06/22/2020  . Lymphedema   . RA (rheumatoid arthritis) ([place])   . Uterine cancer ([place])     Family History  Problem Relation Age of Onset  . Hypertension Other   . Hyperlipidemia Other   . Colon cancer Other        grandmother  . Hashimoto's thyroiditis Mother   . Eczema Mother   . Angioedema Mother   . Throat cancer Father   . Arrhythmia Father   . Angioedema Maternal Grandfather    Past Surgical History:  Procedure Laterality Date  . APPENDECTOMY    . CHOLECYSTECTOMY    . ESOPHAGOGASTRODUODENOSCOPY N/A 04/30/2017   Procedure: ESOPHAGOGASTRODUODENOSCOPY (EGD);  Surgeon: Teena Irani, MD;  Location: [facility] ENDOSCOPY;  Service: Endoscopy;  Laterality: N/A;  . HERNIA REPAIR     x2  . KNEE SURGERY     x3  . MINIMALLY INVASIVE FORAMINOTOMY CERVICAL SPINE     C6-T1, N[place]  . SAVORY DILATION N/A 04/30/2017   Procedure: SAVORY DILATION;  Surgeon: Teena Irani, MD;  Location: [facility] ENDOSCOPY;  Service: Endoscopy;  Laterality: N/A;  . TONSILLECTOMY    . TOTAL ABDOMINAL HYSTERECTOMY     Sarcoma, s/p XRT   Social History   Social History Narrative  . Not on file   Immunization History  Administered Date(s) Administered  . Influenza, Seasonal, Injecte, Preservative Fre 10/22/2014, 10/23/2015, 09/10/2018  . Influenza,inj,Quad PF,6+ Mos 11/21/2017, 09/10/2018   . Influenza-Unspecified 10/22/2014, 10/23/2015  . PFIZER SARS-COV-2 Vaccination 12/27/2019, 02/24/2020  . Pneumococcal Conjugate-13 10/30/2019  . Tdap 04/19/2020     Objective: Vital Signs: BP 109/78 (BP Location: Left Arm, Patient Position: Sitting, Cuff Size: Normal)   Pulse (!) 110   Resp 14   Ht 5\' 4"  (1.626 m)   Wt 170 lb 9.6 oz (77.4 kg)   BMI 29.28 kg/m    Physical Exam Vitals and nursing note reviewed.  Constitutional:      Appearance: She is well-developed.  HENT:     Head: Normocephalic and atraumatic.     Mouth/Throat:     Comments: Oral thrush noted Eyes:     Conjunctiva/sclera: Conjunctivae normal.  Cardiovascular:     Rate and Rhythm: Normal rate and regular rhythm.     Heart sounds: Normal heart sounds.  Pulmonary:     Effort: Pulmonary effort is normal.     Breath sounds: Normal breath sounds.  Abdominal:     General: Bowel sounds are normal.     Palpations: Abdomen is soft.  Musculoskeletal:     Cervical back: Normal range of motion.  Lymphadenopathy:     Cervical: No cervical adenopathy.  Skin:    General: Skin is warm and dry.     Capillary Refill: Capillary refill takes less than 2 seconds.  Neurological:     Mental Status: She  is alert and oriented to person, place, and time.  Psychiatric:        Behavior: Behavior normal.      Musculoskeletal Exam: C-spine was in good range of motion. Shoulder joints, elbow joints, wrist joints, MCPs PIPs and DIPs with good range of motion with no synovitis. She has some PIP and DIP thickening bilaterally. Hip joints, knee joints, ankles, MTPs and PIPs with good range of motion with no synovitis.  CDAI Exam: CDAI Score: 0.8  Patient Global: 5 mm; Provider Global: 3 mm Swollen: 0 ; Tender: 0  Joint Exam 08/11/2020   No joint exam has been documented for this visit   There is currently no information documented on the homunculus. Go to the Rheumatology activity and complete the homunculus joint  exam.  Investigation: No additional findings.  Imaging: No results found.  Recent Labs: Lab Results  Component Value Date   WBC 4.6 07/07/2020   HGB 10.9 (L) 07/07/2020   PLT 254 07/07/2020   NA 141 07/07/2020   K 3.8 07/07/2020   CL 108 07/07/2020   CO2 21 07/07/2020   GLUCOSE 86 07/07/2020   BUN 11 07/07/2020   CREATININE 1.03 07/07/2020   BILITOT 0.3 07/07/2020   ALKPHOS 71 01/26/2020   AST 45 (H) 07/07/2020   ALT 45 (H) 07/07/2020   PROT 5.8 (L) 07/07/2020   ALBUMIN 4.1 01/26/2020   CALCIUM 9.2 07/07/2020   GFRAA 76 07/07/2020   QFTBGOLDPLUS NEGATIVE 10/23/2019    Speciality Comments: No specialty comments available.  Procedures:  No procedures performed Allergies: Other, Sulfa antibiotics, Sulfonamide derivatives, and Benadryl [diphenhydramine hcl]   Assessment / Plan:     Visit Diagnoses: Rheumatoid arthritis involving multiple sites with positive rheumatoid factor (HCC)-she is doing well with no synovitis on examination. She complains of some discomfort in her thumb. I did not notice any synovitis. Her last labs show elevated LFTs. We will repeat LFTs today. If her LFTs are elevated we will should be able to decrease her methotrexate.  High risk medication use - MTX 0.8 ml sq once weekly and folic acid 2 mg po daily.  - Plan: AST, ALT  Keratoconjunctivitis sicca (HCC) - Followed by Dr. Katy Fitch for recurrent conjunctivitis and keratoconjunctivitis.  AVISE index -2.4, all labs were negative except RF IgA 39. She states she had recent visit with Dr. Carolynn Sayers for inflammation in her left eye and was given some prednisone eyedrops.  Carpal tunnel syndrome of right wrist-she still has off-and-on symptoms. Use of brace was discussed.  Raynaud's syndrome without gangrene-she complains of increased Raynaud's symptoms with the weather change. She had good capillary refill.  Thrush, oral-she still continues to have oral thrush. She has been followed by Dr. Tommy Medal.  Eosinophilic esophagitis - Hx of dilatations. Responsive to oral prednisone.  DDD (degenerative disc disease), cervical-she continues to have some stiffness.  Myofascial pain-she has generalized hyperalgesia and positive tender points. Need for regular exercise was emphasized.  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 Tommy Medal. She is receiving IVIG. Her last IVIG infusion was on August 12.  Other medical problems are listed as follows:  Gastroparesis  History of shingles  UTERINE CANCER, HX OF  History of Clostridioides difficile colitis  History of anxiety   She is fully vaccinated against COVID-19. Instructions regarding the booster was given. She will have to hold methotrexate for 1 week after the booster.  Orders: Orders Placed  This Encounter  Procedures  . AST  . ALT   No orders of the defined types were placed in this encounter.     Follow-Up Instructions: Return in about 5 months (around 01/11/2021) for Rheumatoid arthritis.   Bo Merino, MD  Note - This record has been created using Editor, commissioning.  Chart creation errors have been sought, but may not always  have been located. Such creation errors do not reflect on  the standard of medical care.

## 2020-08-04 ENCOUNTER — Ambulatory Visit: Payer: Managed Care, Other (non HMO) | Admitting: Rheumatology

## 2020-08-05 ENCOUNTER — Other Ambulatory Visit (HOSPITAL_COMMUNITY): Payer: Self-pay

## 2020-08-06 ENCOUNTER — Encounter (HOSPITAL_COMMUNITY)
Admission: RE | Admit: 2020-08-06 | Discharge: 2020-08-06 | Disposition: A | Discharge status: Home / Self Care | Payer: Managed Care, Other (non HMO) | Source: Ambulatory Visit | Attending: Infectious Disease | Admitting: Infectious Disease

## 2020-08-06 ENCOUNTER — Other Ambulatory Visit: Payer: Self-pay

## 2020-08-06 DIAGNOSIS — D808 Other immunodeficiencies with predominantly antibody defects: Secondary | ICD-10-CM | POA: Diagnosis not present

## 2020-08-06 MED ORDER — ONDANSETRON HCL 4 MG PO TABS
4.0000 mg | ORAL_TABLET | Freq: Once | ORAL | Status: AC
  Administered 2020-08-06: 4 mg via ORAL

## 2020-08-06 MED ORDER — IMMUNE GLOBULIN (HUMAN) 10 GM/100ML IV SOLN
30.0000 g | INTRAVENOUS | Status: DC
  Administered 2020-08-06: 30 g via INTRAVENOUS
  Filled 2020-08-06: qty 100

## 2020-08-06 MED ORDER — ONDANSETRON HCL 4 MG PO TABS
ORAL_TABLET | ORAL | Status: AC
  Filled 2020-08-06: qty 1

## 2020-08-06 NOTE — Progress Notes (Signed)
Pt c/o nausea post IVIG infusion, md notified. Give Zofran 4 mg PO now Will continue to monitor.

## 2020-08-11 ENCOUNTER — Encounter: Payer: Self-pay | Admitting: Rheumatology

## 2020-08-11 ENCOUNTER — Ambulatory Visit (INDEPENDENT_AMBULATORY_CARE_PROVIDER_SITE_OTHER): Payer: Managed Care, Other (non HMO) | Admitting: Rheumatology

## 2020-08-11 ENCOUNTER — Other Ambulatory Visit: Payer: Self-pay

## 2020-08-11 VITALS — BP 109/78 | HR 110 | Resp 14 | Ht 64.0 in | Wt 170.6 lb

## 2020-08-11 DIAGNOSIS — K3184 Gastroparesis: Secondary | ICD-10-CM

## 2020-08-11 DIAGNOSIS — M3501 Sicca syndrome with keratoconjunctivitis: Secondary | ICD-10-CM

## 2020-08-11 DIAGNOSIS — Z79899 Other long term (current) drug therapy: Secondary | ICD-10-CM | POA: Diagnosis not present

## 2020-08-11 DIAGNOSIS — Z8659 Personal history of other mental and behavioral disorders: Secondary | ICD-10-CM

## 2020-08-11 DIAGNOSIS — Z8542 Personal history of malignant neoplasm of other parts of uterus: Secondary | ICD-10-CM

## 2020-08-11 DIAGNOSIS — M0579 Rheumatoid arthritis with rheumatoid factor of multiple sites without organ or systems involvement: Secondary | ICD-10-CM

## 2020-08-11 DIAGNOSIS — M5030 Other cervical disc degeneration, unspecified cervical region: Secondary | ICD-10-CM

## 2020-08-11 DIAGNOSIS — K200 Eosinophilic esophagitis: Secondary | ICD-10-CM

## 2020-08-11 DIAGNOSIS — G5601 Carpal tunnel syndrome, right upper limb: Secondary | ICD-10-CM

## 2020-08-11 DIAGNOSIS — D808 Other immunodeficiencies with predominantly antibody defects: Secondary | ICD-10-CM

## 2020-08-11 DIAGNOSIS — I7300 Raynaud's syndrome without gangrene: Secondary | ICD-10-CM

## 2020-08-11 DIAGNOSIS — B370 Candidal stomatitis: Secondary | ICD-10-CM

## 2020-08-11 DIAGNOSIS — Z8619 Personal history of other infectious and parasitic diseases: Secondary | ICD-10-CM

## 2020-08-11 DIAGNOSIS — M7918 Myalgia, other site: Secondary | ICD-10-CM

## 2020-08-11 NOTE — Patient Instructions (Addendum)
COVID-19 vaccine recommendations:   COVID-19 vaccine is recommended for everyone (unless you are allergic to a vaccine component), even if you are on a medication that suppresses your immune system.   If you are on Methotrexate, Cellcept (mycophenolate), Rinvoq, Morrie Sheldon, and Olumiant- hold the medication for 1 week after each vaccine. Hold Methotrexate for 2 weeks after the single dose COVID-19 vaccine.   If you are on Orencia subcutaneous injection - hold medication one week prior to and one week after the first COVID-19 vaccine dose (only).   If you are on Orencia IV infusions- time vaccination administration so that the first COVID-19 vaccination will occur four weeks after the infusion and postpone the subsequent infusion by one week.   If you are on Cyclophosphamide or Rituxan infusions please contact your doctor prior to receiving the COVID-19 vaccine.   Do not take Tylenol or ant anti-inflammatory medications (NSAIDs) 24 hours prior to the COVID-19 vaccination.   There is no direct evidence about the efficacy of the COVID-19 vaccine in individuals who are on medications that suppress the immune system.   Even if you are fully vaccinated, and you are on any medications that suppress your immune system, please continue to wear a mask, maintain at least six feet social distance and practice hand hygiene.   If you develop a COVID-19 infection, please contact your PCP or our office to determine if you need antibody infusion.  The booster vaccine is now available for immunocompromised patients. It is advised that if you had Pfizer vaccine you should get [employer] booster.  If you had a Moderna vaccine then you should get a Moderna booster. Johnson and Wynetta Emery does not have a booster vaccine at this time.  Please see the following web sites for updated information.    [email or web address]://[email or web address]-Vaccination-Patient-Resources.[email or web address]  [email or web address]  Standing Labs We placed an order today for your standing lab work.   Please have your standing labs drawn in October and every 3 months  If possible, please have your labs drawn 2 weeks prior to your appointment so that the provider can discuss your results at your appointment.  We have open lab daily Monday through Thursday from 8:30-12:30 PM and 1:30-4:30 PM and Friday from 8:30-12:30 PM and 1:30-4:00 PM at the office of Dr. Bo Merino, [facility].   Please be advised, patients with office appointments requiring lab work will take precedents over walk-in lab work.  If possible, please come for your lab work on Monday and Friday afternoons, as you may experience shorter wait times. The office is located at [address] [place], [place], [place] [address] No appointment is necessary.   Labs are drawn by [facility]. Please bring your co-pay at the time of your lab draw.  You may receive a bill from [facility] for your lab work.  If you wish to have your labs drawn at another location, please call the office 24 hours in advance to send orders.  If you have any questions regarding directions or hours of operation,  please call [phone].   As a reminder, please drink plenty of water prior to coming for your lab work. Thanks!

## 2020-08-12 ENCOUNTER — Other Ambulatory Visit: Payer: Self-pay

## 2020-08-12 ENCOUNTER — Encounter: Payer: Self-pay | Admitting: Infectious Disease

## 2020-08-12 ENCOUNTER — Ambulatory Visit (INDEPENDENT_AMBULATORY_CARE_PROVIDER_SITE_OTHER): Payer: Managed Care, Other (non HMO) | Admitting: Infectious Disease

## 2020-08-12 VITALS — BP 109/79 | HR 102 | Temp 98.1°F | Wt 169.0 lb

## 2020-08-12 DIAGNOSIS — R7401 Elevation of levels of liver transaminase levels: Secondary | ICD-10-CM

## 2020-08-12 DIAGNOSIS — L304 Erythema intertrigo: Secondary | ICD-10-CM

## 2020-08-12 DIAGNOSIS — D808 Other immunodeficiencies with predominantly antibody defects: Secondary | ICD-10-CM | POA: Diagnosis not present

## 2020-08-12 DIAGNOSIS — Z889 Allergy status to unspecified drugs, medicaments and biological substances status: Secondary | ICD-10-CM

## 2020-08-12 DIAGNOSIS — A0472 Enterocolitis due to Clostridium difficile, not specified as recurrent: Secondary | ICD-10-CM

## 2020-08-12 DIAGNOSIS — M057A Rheumatoid arthritis with rheumatoid factor of other specified site without organ or systems involvement: Secondary | ICD-10-CM

## 2020-08-12 DIAGNOSIS — M069 Rheumatoid arthritis, unspecified: Secondary | ICD-10-CM

## 2020-08-12 DIAGNOSIS — B370 Candidal stomatitis: Secondary | ICD-10-CM | POA: Diagnosis not present

## 2020-08-12 DIAGNOSIS — D806 Antibody deficiency with near-normal immunoglobulins or with hyperimmunoglobulinemia: Secondary | ICD-10-CM

## 2020-08-12 DIAGNOSIS — K141 Geographic tongue: Secondary | ICD-10-CM

## 2020-08-12 DIAGNOSIS — B001 Herpesviral vesicular dermatitis: Secondary | ICD-10-CM

## 2020-08-12 HISTORY — DX: Geographic tongue: K14.1

## 2020-08-12 HISTORY — DX: Herpesviral vesicular dermatitis: B00.1

## 2020-08-12 HISTORY — DX: Rheumatoid arthritis, unspecified: M06.9

## 2020-08-12 HISTORY — DX: Elevation of levels of liver transaminase levels: R74.01

## 2020-08-12 LAB — CBC WITH DIFFERENTIAL/PLATELET
Absolute Monocytes: 151 cells/uL — ABNORMAL LOW (ref 200–950)
Basophils Absolute: 39 cells/uL (ref 0–200)
Basophils Relative: 0.7 %
Eosinophils Absolute: 123 cells/uL (ref 15–500)
Eosinophils Relative: 2.2 %
HCT: 38.3 % (ref 35.0–45.0)
Hemoglobin: 13.1 g/dL (ref 11.7–15.5)
Lymphs Abs: 1898 cells/uL (ref 850–3900)
MCH: 33.2 pg — ABNORMAL HIGH (ref 27.0–33.0)
MCHC: 34.2 g/dL (ref 32.0–36.0)
MCV: 97 fL (ref 80.0–100.0)
MPV: 10.2 fL (ref 7.5–12.5)
Monocytes Relative: 2.7 %
Neutro Abs: 3388 cells/uL (ref 1500–7800)
Neutrophils Relative %: 60.5 %
Platelets: 329 10*3/uL (ref 140–400)
RBC: 3.95 10*6/uL (ref 3.80–5.10)
RDW: 15.2 % — ABNORMAL HIGH (ref 11.0–15.0)
Total Lymphocyte: 33.9 %
WBC: 5.6 10*3/uL (ref 3.8–10.8)

## 2020-08-12 LAB — COMPLETE METABOLIC PANEL WITH GFR
AG Ratio: 1.5 (calc) (ref 1.0–2.5)
ALT: 79 U/L — ABNORMAL HIGH (ref 6–29)
AST: 72 U/L — ABNORMAL HIGH (ref 10–35)
Albumin: 4.6 g/dL (ref 3.6–5.1)
Alkaline phosphatase (APISO): 73 U/L (ref 31–125)
BUN: 9 mg/dL (ref 7–25)
CO2: 23 mmol/L (ref 20–32)
Calcium: 10.4 mg/dL — ABNORMAL HIGH (ref 8.6–10.2)
Chloride: 108 mmol/L (ref 98–110)
Creat: 0.8 mg/dL (ref 0.50–1.10)
GFR, Est African American: 103 mL/min/{1.73_m2} (ref >=60)
GFR, Est Non African American: 89 mL/min/{1.73_m2} (ref >=60)
Globulin: 3 g/dL (calc) (ref 1.9–3.7)
Glucose, Bld: 105 mg/dL — ABNORMAL HIGH (ref 65–99)
Potassium: 4.1 mmol/L (ref 3.5–5.3)
Sodium: 141 mmol/L (ref 135–146)
Total Bilirubin: 0.7 mg/dL (ref 0.2–1.2)
Total Protein: 7.6 g/dL (ref 6.1–8.1)

## 2020-08-12 MED ORDER — VALACYCLOVIR HCL 1 G PO TABS: 1000.0000 mg | ORAL_TABLET | Freq: Every day | ORAL | 5 refills | Status: DC

## 2020-08-12 NOTE — Patient Instructions (Signed)
Stop the fluconazole and I would also stop the nystatin swish and swallow  RTC in early September

## 2020-08-12 NOTE — Progress Notes (Signed)
Subjective:   Chief complaint: Recent outbreak of HSV 1 labialis still continued problems with what is thought to be thrush   Patient ID: [name] Horn, female    DOB: [birth year], 44 y.o.   MRN: [id number]  HPI   44 year old Caucasian lady with hx of seropositive RA with keratoconjunctivitis sicca, and osteoarthritis.who  Started on MTX in November of  2020. She also  Has hx of eosinophilic esophagitis and was on fluticasone. She has had history of C difficile with 3 episodes. She  Had problems with recurrent strep throat and had tonsillectomy, pneumonia a few times, ear infection, tooth abscess. She  Has had  Shingles outbreak already. She then began to have thrush in January  which was attributed to her  Oral corticosteroid therapy but  Has persisted despite this being stopped.   She wastaking nystatin swish and  Swallow and previously twice weekly fluconazole at 200 mg  While her  Overall immunoglobulin levels were normal she did not mount immune response to vaccination with pneumovax and prevnar with antibodies checked > 4 months after this that were low.  She was bitten by a cat and recently had tetanus vaccine in ER and antibiotics.  After 2 COVID vaccines she had not much of a response in terms of flu like  Symptoms etc that are typically experienced by patients with 2 dose mRNA based vaccines.   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  When I last saw her she was suffering from intertrigo.  I increased her fluconazole 200 mg daily prophylactic dose and a higher dose we are treating her.  She is continues to have abnormalities on her tongue but it is not clear to me that this is really thrush but could be something else such as geographic tongue for example.   Past Medical History:  Diagnosis Date  . Complication of anesthesia    " i TAKE A LIITLE BIT LONGER TO [name] Horn UP "  . DVT  (deep venous thrombosis) ([name] Horn)    x2  . Gastroparesis 08/07/2019  . Intertrigo 06/22/2020  . Lymphedema   . RA (rheumatoid arthritis) ([name] Horn)   . Uterine cancer [name] Horn)     Past Surgical History:  Procedure Laterality Date  . APPENDECTOMY    . CHOLECYSTECTOMY    . ESOPHAGOGASTRODUODENOSCOPY N/A 04/30/2017   Procedure: ESOPHAGOGASTRODUODENOSCOPY (EGD);  Surgeon: Teena Irani, MD;  Location: [facility] ENDOSCOPY;  Service: Endoscopy;  Laterality: N/A;  . HERNIA REPAIR     x2  . KNEE SURGERY     x3  . MINIMALLY INVASIVE FORAMINOTOMY CERVICAL SPINE     C6-T1, Nitka  . SAVORY DILATION N/A 04/30/2017   Procedure: SAVORY DILATION;  Surgeon: Teena Irani, MD;  Location: [facility] ENDOSCOPY;  Service: Endoscopy;  Laterality: N/A;  . TONSILLECTOMY    . TOTAL ABDOMINAL HYSTERECTOMY     [name] Horn, s/p XRT    Family History  Problem Relation Age of Onset  . Hypertension Other   . Hyperlipidemia Other   . Colon cancer Other        grandmother  . Hashimoto's thyroiditis Mother   . Eczema Mother   . Angioedema Mother   . Throat cancer Father   . Arrhythmia Father   . Angioedema Maternal Grandfather       Social History   Socioeconomic History  . Marital status: Married    Spouse name: Not  on file  . Number of children: Not on file  . Years of education: Not on file  . Highest education level: Not on file  Occupational History  . Not on file  Tobacco Use  . Smoking status: Passive Smoke Exposure - Never Smoker  . Smokeless tobacco: Never Used  Vaping Use  . Vaping Use: Never used  Substance and Sexual Activity  . Alcohol use: Not Currently  . Drug use: No  . Sexual activity: Yes    Partners: Male    Birth control/protection: None  Other Topics Concern  . Not on file  Social History Narrative  . Not on file   Social Determinants of Health   Financial Resource Strain:   . Difficulty of Paying Living Expenses:   Food Insecurity:   . Worried About Charity fundraiser in the Last Year:   .  Arboriculturist in the Last Year:   Transportation Needs:   . Film/video editor (Medical):   Marland Kitchen Lack of Transportation (Non-Medical):   Physical Activity:   . Days of Exercise per Week:   . Minutes of Exercise per Session:   Stress:   . Feeling of Stress :   Social Connections:   . Frequency of Communication with Friends and Family:   . Frequency of Social Gatherings with Friends and Family:   . Attends Religious Services:   . Active Member of Clubs or Organizations:   . Attends Archivist Meetings:   Marland Kitchen Marital Status:     Allergies  Allergen Reactions  . Other     Dermabond surgical glue.   . Sulfa Antibiotics Anaphylaxis, Shortness Of Breath, Swelling and Rash    Angioedema (also)  . Sulfonamide Derivatives Hives, Shortness Of Breath and Swelling    TONGUE SWELLS  . Benadryl [Diphenhydramine Hcl] Other (See Comments)    Hyperactivity      Current Outpatient Medications:  .  acyclovir (ZOVIRAX) 400 MG tablet, Take 400 mg by mouth 3 (three) times daily., Disp: , Rfl:  .  amphetamine-dextroamphetamine (ADDERALL) 20 MG tablet, Take 20 mg by mouth 3 (three) times daily., Disp: , Rfl:  .  atropine 1 % ophthalmic solution, Place 1 drop into the left eye daily as needed (for dry eye)., Disp: , Rfl:  .  baclofen (LIORESAL) 10 MG tablet, Take 10 mg by mouth daily., Disp: , Rfl:  .  Bepotastine Besilate (BEPREVE) 1.5 % SOLN, Place 1 drop into both eyes 2 (two) times a day., Disp: , Rfl:  .  cycloSPORINE (RESTASIS) 0.05 % ophthalmic emulsion, Place 1 drop into both eyes 2 (two) times daily., Disp: , Rfl:  .  dicyclomine (BENTYL) 10 MG capsule, Take 10 mg by mouth daily as needed for nausea/vomiting., Disp: , Rfl:  .  folic acid (FOLVITE) 1 MG tablet, Take 2 tablets (2 mg total) by mouth daily., Disp: 180 tablet, Rfl: 3 .  furosemide (LASIX) 20 MG tablet, Take 20 mg by mouth daily as needed. , Disp: , Rfl:  .  hyoscyamine (LEVSIN, ANASPAZ) 0.125 MG tablet, Take 0.125 mg  by mouth every 4 (four) hours as needed for cramping. , Disp: , Rfl:  .  LORazepam (ATIVAN) 1 MG tablet, Take 1 mg by mouth 3 (three) times daily as needed., Disp: , Rfl:  .  methotrexate 50 MG/2ML injection, Inject 0.68mL into the skin once weekly., Disp: 10 mL, Rfl: 0 .  metoCLOPramide (REGLAN) 10 MG tablet, Take 10 mg by mouth  daily. , Disp: , Rfl:  .  metoprolol succinate (TOPROL-XL) 25 MG 24 hr tablet, Take 25 mg by mouth daily., Disp: , Rfl:  .  nystatin (MYCOSTATIN) 100000 UNIT/ML suspension, Take 10 mLs by mouth 3 (three) times daily., Disp: , Rfl:  .  ondansetron (ZOFRAN-ODT) 4 MG disintegrating tablet, Take 1 tablet (4 mg total) by mouth every 8 (eight) hours as needed for nausea or vomiting., Disp: 15 tablet, Rfl: 0 .  oxyCODONE-acetaminophen (PERCOCET) 7.5-325 MG tablet, , Disp: , Rfl:  .  pantoprazole (PROTONIX) 40 MG tablet, Take 1 tablet (40 mg total) by mouth 2 (two) times daily., Disp: 60 tablet, Rfl: 1 .  Polyethyl Glycol-Propyl Glycol (SYSTANE) 0.4-0.3 % GEL ophthalmic gel, Place 1 application into both eyes daily as needed (dryness)., Disp: , Rfl:  .  potassium chloride (K-DUR) 10 MEQ tablet, Take 10 mEq by mouth daily as needed. , Disp: , Rfl:  .  pravastatin (PRAVACHOL) 40 MG tablet, Take 40 mg by mouth daily., Disp: , Rfl:  .  prednisoLONE acetate (PRED FORTE) 1 % ophthalmic suspension, Place 1 drop into the left eye 2 (two) times daily., Disp: , Rfl:  .  promethazine (PHENERGAN) 25 MG suppository, Place 1 suppository (25 mg total) rectally every 6 (six) hours as needed for nausea or vomiting., Disp: 12 each, Rfl: 0 .  SUMAtriptan (IMITREX) 25 MG tablet, Take 25 mg by mouth every 2 (two) hours as needed for migraine. May repeat in 2 hours if headache persists or recurs., Disp: , Rfl:  .  topiramate (TOPAMAX) 100 MG tablet, Take 100 mg by mouth daily., Disp: , Rfl:  .  topiramate (TOPAMAX) 25 MG tablet, Take 25 mg by mouth at bedtime. , Disp: , Rfl:  .  traZODone (DESYREL) 150  MG tablet, Take 150 mg by mouth at bedtime as needed for sleep., Disp: , Rfl:  .  triamcinolone lotion (KENALOG) 0.1 %, APPLY TOPICALLY TO SCALP AT BEDTIME AS DIRECTED, Disp: , Rfl:  .  TUBERCULIN SYR 1CC/27GX1/2" (B-D TB SYRINGE 1CC/27GX1/2") 27G X 1/2" 1 ML MISC, 12 Syringes by Does not apply route once a week., Disp: 12 each, Rfl: 3     Review of Systems  Constitutional: Negative for activity change, appetite change, chills, diaphoresis, fatigue, fever and unexpected weight change.  HENT: Negative for congestion, rhinorrhea, sinus pressure, sneezing, sore throat and trouble swallowing.   Eyes: Negative for photophobia and visual disturbance.  Respiratory: Negative for cough, chest tightness, wheezing and stridor.   Cardiovascular: Negative for chest pain and palpitations.  Gastrointestinal: Negative for abdominal distention, abdominal pain, anal bleeding, blood in stool, constipation, diarrhea, nausea and vomiting.  Genitourinary: Negative for difficulty urinating, dysuria, flank pain and hematuria.  Musculoskeletal: Negative for arthralgias, back pain, gait problem, joint swelling and myalgias.  Skin: Positive for rash. Negative for color change, pallor and wound.  Neurological: Negative for dizziness, tremors, weakness and light-headedness.  Hematological: Negative for adenopathy. Does not bruise/bleed easily.  Psychiatric/Behavioral: Negative for agitation, behavioral problems, confusion, decreased concentration, dysphoric mood and sleep disturbance.       Objective:   Physical Exam Constitutional:      General: She is not in acute distress.    Appearance: Normal appearance. She is well-developed. She is not ill-appearing or diaphoretic.  HENT:     Head: Normocephalic and atraumatic.     Right Ear: Hearing and external ear normal.     Left Ear: Hearing and external ear normal.     Nose:  No nasal deformity or rhinorrhea.     Mouth/Throat:     Mouth: Mucous membranes are moist.       Pharynx: No posterior oropharyngeal erythema.   Eyes:     General: No scleral icterus.    Extraocular Movements: Extraocular movements intact.     Conjunctiva/sclera: Conjunctivae normal.     Right eye: Right conjunctiva is not injected.     Left eye: Left conjunctiva is not injected.  Neck:     Vascular: No JVD.  Cardiovascular:     Rate and Rhythm: Normal rate and regular rhythm.     Heart sounds: S1 normal and S2 normal. No murmur heard.   Pulmonary:     Effort: Pulmonary effort is normal. No respiratory distress.     Breath sounds: No stridor. No wheezing or rhonchi.  Abdominal:     General: There is no distension.     Palpations: Abdomen is soft.  Musculoskeletal:        General: Normal range of motion.     Right shoulder: Normal.     Left shoulder: Normal.     Cervical back: Normal range of motion and neck supple.     Right hip: Normal.     Left hip: Normal.     Right knee: Normal.     Left knee: Normal.  Lymphadenopathy:     Head:     Right side of head: No submandibular, preauricular or posterior auricular adenopathy.     Left side of head: No submandibular, preauricular or posterior auricular adenopathy.     Cervical: No cervical adenopathy.     Right cervical: No superficial or deep cervical adenopathy.    Left cervical: No superficial or deep cervical adenopathy.  Skin:    General: Skin is warm and dry.     Coloration: Skin is not pale.     Findings: No abrasion, bruising, ecchymosis, erythema, lesion or rash.     Nails: There is no clubbing.  Neurological:     General: No focal deficit present.     Mental Status: She is alert and oriented to person, place, and time.     Sensory: No sensory deficit.     Coordination: Coordination normal.     Gait: Gait normal.  Psychiatric:        Attention and Perception: Attention and perception normal. She is attentive.        Mood and Affect: Mood is anxious.        Speech: Speech normal.        Behavior:  Behavior normal. Behavior is cooperative.        Thought Content: Thought content normal.        Cognition and Memory: Cognition and memory normal.        Judgment: Judgment normal.    Op lesions from photograph she showed me several visits ago.    Intertrigo at last visit      Tongue today on August 12, 2020:         Assessment & Plan:   Oral lesions: It is starting to make less and less sense to me that this is thrush.  If it really truly was thrush then it should spine wholeheartedly to fluconazole therapy.  Yes Candida can acquire Azle resistance but she is only been receiving antifungal therapy for a little bit less than a year and it would seem highly unusual for developed Azle resistant thrush.  I will have her discontinue her fluconazole and  come back and see me in a couple of weeks to see how her tongue looks at that point in time.  Could certainly swab for culture though the presence of Candida on culture does not prove the diagnosis either.  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  She is being followed by immunology as well as rheumatology  Rheumatology patient currently on methotrexate.  Transaminitis consideration was being given for methotrexate to be reduced due to elevated liver function tests I will check a repeat CMP today to save her a trip to rheumatologist and forward labs to Dr. Cyndie Chime far.  We are getting rid of the fluconazole the day as well for the time being.  Over 19 prevention I would be in favor of her getting a third dose of COVID-19 vaccine

## 2020-08-14 ENCOUNTER — Telehealth: Payer: Self-pay | Admitting: *Deleted

## 2020-08-14 DIAGNOSIS — Z79899 Other long term (current) drug therapy: Secondary | ICD-10-CM

## 2020-08-14 NOTE — Telephone Encounter (Signed)
-----   Message from Bo Merino, MD sent at 08/14/2020  8:01 AM EDT ----- CBC is stable.  LFTs are elevated.  Please advise patient to reduce methotrexate to 0.4 mL subcu weekly.  Repeat LFTs in 2 weeks.

## 2020-08-14 NOTE — Progress Notes (Signed)
CBC is stable.  LFTs are elevated.  Please advise patient to reduce methotrexate to 0.4 mL subcu weekly.  Repeat LFTs in 2 weeks.

## 2020-08-26 ENCOUNTER — Ambulatory Visit: Payer: Managed Care, Other (non HMO) | Admitting: Infectious Disease

## 2020-08-27 ENCOUNTER — Other Ambulatory Visit: Payer: Self-pay | Admitting: *Deleted

## 2020-08-27 ENCOUNTER — Telehealth: Payer: Self-pay | Admitting: Rheumatology

## 2020-08-27 DIAGNOSIS — Z79899 Other long term (current) drug therapy: Secondary | ICD-10-CM

## 2020-08-27 NOTE — Telephone Encounter (Signed)
Noted  

## 2020-08-27 NOTE — Telephone Encounter (Signed)
FYI: Patient going to [facility] for labs today. She is concerned about getting results in time for her next injection she is due for tomorrow.  Patient advised to hold on taking next dose tomorrow until she hears from our office. Patient to call if she does not hear anything by 4:30pm.

## 2020-08-28 LAB — AST: AST: 51 U/L — ABNORMAL HIGH (ref 10–35)

## 2020-08-28 LAB — ALT: ALT: 48 U/L — ABNORMAL HIGH (ref 6–29)

## 2020-09-01 ENCOUNTER — Telehealth: Payer: Self-pay

## 2020-09-01 NOTE — Progress Notes (Signed)
LFTs are still elevated but better.  I would recommend repeating AST and ALT in 1 month.

## 2020-09-01 NOTE — Telephone Encounter (Signed)
Patient called office today stating she has increase sores on tongue and mouth. Noticed this yesterday and feels like it is getting worse. Patient has also noticed swelling in her throat, but denies any issues breathing.  Patient denies any changes in medication/diet. Patient believes this could be related to thrush.  Advised patient if swelling in throat or tongue get worse to go to ED for evaluation. Patient verbalized understanding. [name] Horn

## 2020-09-01 NOTE — Telephone Encounter (Signed)
Can we actually have her seen by one of my partners (I dont think I have space tomorrow myself) I had purposedly with-held antifungal therapy SO that we could see what it looked like in person and decide if this is really thrush or not (I am skeptical of diagnosis)

## 2020-09-02 ENCOUNTER — Ambulatory Visit: Payer: Managed Care, Other (non HMO) | Admitting: Internal Medicine

## 2020-09-02 NOTE — Telephone Encounter (Signed)
Ok perfect. John this is a strange case w patient thought to have thrush but I am wondering if she really has a different diagnosis. Ill talk to you before you see her and or feel free to ring me

## 2020-09-02 NOTE — Telephone Encounter (Signed)
Patient scheduled for today at 3:15 with Dr. Megan Salon. Patient is aware of appointment.

## 2020-09-03 ENCOUNTER — Ambulatory Visit (INDEPENDENT_AMBULATORY_CARE_PROVIDER_SITE_OTHER): Payer: Managed Care, Other (non HMO) | Admitting: Internal Medicine

## 2020-09-03 ENCOUNTER — Encounter: Payer: Self-pay | Admitting: Internal Medicine

## 2020-09-03 ENCOUNTER — Encounter (HOSPITAL_COMMUNITY)
Admission: RE | Admit: 2020-09-03 | Discharge: 2020-09-03 | Disposition: A | Discharge status: Home / Self Care | Payer: Managed Care, Other (non HMO) | Source: Ambulatory Visit | Attending: Infectious Disease | Admitting: Infectious Disease

## 2020-09-03 ENCOUNTER — Other Ambulatory Visit: Payer: Self-pay

## 2020-09-03 VITALS — BP 107/73 | HR 67 | Temp 98.0°F | Wt 172.0 lb

## 2020-09-03 DIAGNOSIS — D808 Other immunodeficiencies with predominantly antibody defects: Secondary | ICD-10-CM | POA: Insufficient documentation

## 2020-09-03 DIAGNOSIS — K121 Other forms of stomatitis: Secondary | ICD-10-CM | POA: Diagnosis not present

## 2020-09-03 MED ORDER — IMMUNE GLOBULIN (HUMAN) 10 GM/100ML IV SOLN
30.0000 g | INTRAVENOUS | Status: DC
  Administered 2020-09-03: 30 g via INTRAVENOUS
  Filled 2020-09-03: qty 200

## 2020-09-03 MED ORDER — FLUCONAZOLE 100 MG PO TABS: 200.0000 mg | ORAL_TABLET | Freq: Every day | ORAL | 0 refills | Status: DC

## 2020-09-03 NOTE — Addendum Note (Signed)
Addended by: Michel Bickers on: 09/03/2020 02:59 PM   Modules accepted: Orders

## 2020-09-03 NOTE — Progress Notes (Addendum)
[facility] for Infectious Disease  Patient Active Problem List   Diagnosis Date Noted  . Stomatitis 09/03/2020    Priority: High  . Rheumatoid arthritis ([place]) 08/12/2020  . Herpes labialis 08/12/2020  . Transaminitis 08/12/2020  . 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 Intertrigo 06/22/2020  . Interstitial cystitis 05/18/2020  . 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 Chronic rhinitis 04/23/2020  . Adverse food reaction 04/23/2020  . Eosinophilic esophagitis 56/43/3295  . Oral candidiasis 04/02/2020  . History of Clostridium difficile infection 04/02/2020  . History of shingles 04/02/2020  . History of asthma 04/02/2020  . Multiple drug allergies 04/02/2020  . Environmental allergies 04/02/2020  . History of loop recorder 01/19/2020  . PVC (premature ventricular contraction) 01/19/2020  . Uterine cancer ([place]) 01/19/2020  . Colitis 08/14/2019  . Constipation   . Gastroparesis 04/03/2019  . Palpitations 04/10/2018  . Nausea and vomiting 04/26/2017  . Nausea & vomiting 04/25/2017  . Chest pain 04/25/2017  . Enterocolitis due to Clostridium difficile 01/04/2016  . Incisional hernia without obstruction or gangrene 11/18/2015  . Personal history of other diseases of the nervous system and sense organs 10/14/2015  . Disease of pancreas 01/21/2015  . Other specified bacterial agents as the cause of diseases classified elsewhere 01/21/2015  . Radiculopathy of cervical region 12/09/2014  . Annual physical exam 01/30/2012  . Melena 01/30/2012  . Pain in ankle 11/24/2010  . ANXIETY 10/11/2010  . DVT 10/11/2010  . COSTOCHONDRITIS 03/03/2008  . Cellulitis of toe 12/01/2007  . MENOPAUSE, PREMATURE 07/20/2007  . UTERINE CANCER, HX OF 07/16/2007  . Other postprocedural status(V45.89) 07/16/2007    Patient's Medications  New Prescriptions   FLUCONAZOLE  (DIFLUCAN) 100 MG TABLET    Take 2 tablets (200 mg total) by mouth daily.  Previous Medications   ACYCLOVIR (ZOVIRAX) 400 MG TABLET    Take 400 mg by mouth 3 (three) times daily.    AMPHETAMINE-DEXTROAMPHETAMINE (ADDERALL) 20 MG TABLET    Take 20 mg by mouth 3 (three) times daily.   ANTACID 200-200-20 MG/5ML SUSPENSION    Take by mouth.   ATROPINE 1 % OPHTHALMIC SOLUTION    Place 1 drop into the left eye daily as needed (for dry eye).   BACLOFEN (LIORESAL) 10 MG TABLET    Take 10 mg by mouth daily.   BEPOTASTINE BESILATE (BEPREVE) 1.5 % SOLN    Place 1 drop into both eyes 2 (two) times a day.   CYCLOSPORINE (RESTASIS) 0.05 % OPHTHALMIC EMULSION    Place 1 drop into both eyes 2 (two) times daily.   DICYCLOMINE (BENTYL) 10 MG CAPSULE    Take 10 mg by mouth daily as needed for nausea/vomiting.   FOLIC ACID (FOLVITE) 1 MG TABLET    Take 2 tablets (2 mg total) by mouth daily.   FUROSEMIDE (LASIX) 20 MG TABLET    Take 20 mg by mouth daily as needed.    HYOSCYAMINE (LEVSIN, ANASPAZ) 0.125 MG TABLET    Take 0.125 mg by mouth every 4 (four) hours as needed for cramping.    LORAZEPAM (ATIVAN) 1 MG TABLET    Take 1 mg by mouth 3 (three) times daily as needed.   METHOTREXATE 50 MG/2ML INJECTION    Inject 0.28mL into the skin once weekly.   METOCLOPRAMIDE (REGLAN) 10 MG TABLET    Take 10 mg by mouth daily.  METOPROLOL SUCCINATE (TOPROL-XL) 25 MG 24 HR TABLET    Take 25 mg by mouth daily.   NYSTATIN (MYCOSTATIN) 100000 UNIT/ML SUSPENSION    Take 10 mLs by mouth 3 (three) times daily.   ONDANSETRON (ZOFRAN-ODT) 4 MG DISINTEGRATING TABLET    Take 1 tablet (4 mg total) by mouth every 8 (eight) hours as needed for nausea or vomiting.   OXYCODONE-ACETAMINOPHEN (PERCOCET) 7.5-325 MG TABLET       PANTOPRAZOLE (PROTONIX) 40 MG TABLET    Take 1 tablet (40 mg total) by mouth 2 (two) times daily.   POLYETHYL GLYCOL-PROPYL GLYCOL (SYSTANE) 0.4-0.3 % GEL OPHTHALMIC GEL    Place 1 application into both eyes daily as  needed (dryness).   POTASSIUM CHLORIDE (K-DUR) 10 MEQ TABLET    Take 10 mEq by mouth daily as needed.    PRAVASTATIN (PRAVACHOL) 40 MG TABLET    Take 40 mg by mouth daily.   PREDNISOLONE ACETATE (PRED FORTE) 1 % OPHTHALMIC SUSPENSION    Place 1 drop into the left eye 2 (two) times daily.   PROMETHAZINE (PHENERGAN) 25 MG SUPPOSITORY    Place 1 suppository (25 mg total) rectally every 6 (six) hours as needed for nausea or vomiting.   SUMATRIPTAN (IMITREX) 25 MG TABLET    Take 25 mg by mouth every 2 (two) hours as needed for migraine. May repeat in 2 hours if headache persists or recurs.   TOPIRAMATE (TOPAMAX) 100 MG TABLET    Take 100 mg by mouth daily.   TOPIRAMATE (TOPAMAX) 25 MG TABLET    Take 25 mg by mouth at bedtime.    TRAZODONE (DESYREL) 150 MG TABLET    Take 150 mg by mouth at bedtime as needed for sleep.   TRIAMCINOLONE LOTION (KENALOG) 0.1 %    APPLY TOPICALLY TO SCALP AT BEDTIME AS DIRECTED   TUBERCULIN SYR 1CC/27GX1/2" (B-D TB SYRINGE 1CC/27GX1/2") 27G X 1/2" 1 ML MISC    12 Syringes by Does not apply route once a week.   VALACYCLOVIR (VALTREX) 1000 MG TABLET    Take 1 tablet (1,000 mg total) by mouth daily.  Modified Medications   No medications on file  Discontinued Medications   No medications on file    Subjective: [name] Horn is seen on a working basis.  She is followed by my partner, Dr. Tommy Horn, she has rheumatoid arthritis and possible immunodeficiency.  She has been bothered by recurrent stomatitis.  He instructed her to stop fluconazole and nystatin several weeks ago.  She recently developed a recurrence of her oral lesions.  She recalls that in the past the lesions would get a little bit better on 100 mg of fluconazole.  She says that she got better more quickly when she took 200 mg of fluconazole.  Review of Systems: Review of Systems  Constitutional: Negative for fever.  HENT:       As noted in HPI.    Past Medical History:  Diagnosis Date  . Complication of  anesthesia    " i TAKE A LIITLE BIT LONGER TO [place] UP "  . DVT (deep venous thrombosis) ([place])    x2  . Gastroparesis 08/07/2019  . 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 Herpes labialis 08/12/2020  . Intertrigo 06/22/2020  . Lymphedema   . RA (rheumatoid arthritis) ([place])   . Rheumatoid arthritis ([place]) 08/12/2020  . Transaminitis 08/12/2020  . Uterine cancer [facility])     Social History   Tobacco Use  . Smoking status: Passive Smoke  Exposure - Never Smoker  . Smokeless tobacco: Never Used  Vaping Use  . Vaping Use: Never used  Substance Use Topics  . Alcohol use: Not Currently  . Drug use: No    Family History  Problem Relation Age of Onset  . Hypertension Other   . Hyperlipidemia Other   . Colon cancer Other        grandmother  . Hashimoto's thyroiditis Mother   . Eczema Mother   . Angioedema Mother   . Throat cancer Father   . Arrhythmia Father   . Angioedema Maternal Grandfather     Allergies  Allergen Reactions  . Other     Dermabond surgical glue.   . Sulfa Antibiotics Anaphylaxis, Shortness Of Breath, Swelling and Rash    Angioedema (also)  . Sulfonamide Derivatives Hives, Shortness Of Breath and Swelling    TONGUE SWELLS  . Benadryl [Diphenhydramine Hcl] Other (See Comments)    Hyperactivity     Objective: Vitals:   09/03/20 1413  BP: 107/73  Pulse: 67  Temp: 98 F (36.7 C)  TempSrc: Oral  Weight: 172 lb (78 kg)   Body mass index is 29.52 kg/m.  Physical Exam Constitutional:      Comments: She is very pleasant.  HENT:     Mouth/Throat:     Comments: She has scattered areas of pinpoint white exudate on her inner lips and buccal mucosa.  She has upper dentures but she does not wear them.          Problem List Items Addressed This Visit      High   Stomatitis    Obtain cultures today of her oral lesions.  If Candida is grown I will asked the lab to do antibiotic susceptibility testing.  I will start her back on fluconazole 200 mg daily.       Relevant Medications   fluconazole (DIFLUCAN) 100 MG tablet   Other Relevant Orders   Wound culture       Michel Bickers, MD [facility] for Infectious [place] [phone] pager   [phone] cell 09/03/2020, 2:56 PM

## 2020-09-03 NOTE — Addendum Note (Signed)
Addended by: Caffie Pinto on: 09/03/2020 03:06 PM   Modules accepted: Orders

## 2020-09-03 NOTE — Assessment & Plan Note (Signed)
Obtain cultures today of her oral lesions.  If Candida is grown I will asked the lab to do antibiotic susceptibility testing.  I will start her back on fluconazole 200 mg daily.

## 2020-09-05 ENCOUNTER — Other Ambulatory Visit: Payer: Self-pay

## 2020-09-05 ENCOUNTER — Encounter (HOSPITAL_COMMUNITY): Payer: Self-pay | Admitting: Emergency Medicine

## 2020-09-05 ENCOUNTER — Emergency Department (HOSPITAL_COMMUNITY)
Admission: EM | Admit: 2020-09-05 | Discharge: 2020-09-07 | Disposition: A | Discharge status: Home / Self Care | Payer: Managed Care, Other (non HMO) | Attending: Emergency Medicine | Admitting: Emergency Medicine

## 2020-09-05 ENCOUNTER — Emergency Department (HOSPITAL_COMMUNITY): Payer: Managed Care, Other (non HMO)

## 2020-09-05 DIAGNOSIS — Z7722 Contact with and (suspected) exposure to environmental tobacco smoke (acute) (chronic): Secondary | ICD-10-CM | POA: Diagnosis not present

## 2020-09-05 DIAGNOSIS — M791 Myalgia, unspecified site: Secondary | ICD-10-CM | POA: Diagnosis not present

## 2020-09-05 DIAGNOSIS — E860 Dehydration: Secondary | ICD-10-CM | POA: Diagnosis not present

## 2020-09-05 DIAGNOSIS — J45909 Unspecified asthma, uncomplicated: Secondary | ICD-10-CM | POA: Diagnosis not present

## 2020-09-05 DIAGNOSIS — Z20822 Contact with and (suspected) exposure to covid-19: Secondary | ICD-10-CM | POA: Insufficient documentation

## 2020-09-05 DIAGNOSIS — R509 Fever, unspecified: Secondary | ICD-10-CM | POA: Diagnosis not present

## 2020-09-05 DIAGNOSIS — Z8542 Personal history of malignant neoplasm of other parts of uterus: Secondary | ICD-10-CM | POA: Diagnosis not present

## 2020-09-05 DIAGNOSIS — Z79899 Other long term (current) drug therapy: Secondary | ICD-10-CM | POA: Diagnosis not present

## 2020-09-05 DIAGNOSIS — R531 Weakness: Secondary | ICD-10-CM | POA: Diagnosis not present

## 2020-09-05 DIAGNOSIS — R638 Other symptoms and signs concerning food and fluid intake: Secondary | ICD-10-CM | POA: Insufficient documentation

## 2020-09-05 LAB — CBC
HCT: 37.7 % (ref 36.0–46.0)
Hemoglobin: 12.4 g/dL (ref 12.0–15.0)
MCH: 33.7 pg (ref 26.0–34.0)
MCHC: 32.9 g/dL (ref 30.0–36.0)
MCV: 102.4 fL — ABNORMAL HIGH (ref 80.0–100.0)
Platelets: 214 10*3/uL (ref 150–400)
RBC: 3.68 MIL/uL — ABNORMAL LOW (ref 3.87–5.11)
RDW: 15.2 % (ref 11.5–15.5)
WBC: 7.5 10*3/uL (ref 4.0–10.5)
nRBC: 0 % (ref 0.0–0.2)

## 2020-09-05 MED ORDER — ACETAMINOPHEN 500 MG PO TABS
1000.0000 mg | ORAL_TABLET | Freq: Once | ORAL | Status: AC
  Administered 2020-09-05: 1000 mg via ORAL
  Filled 2020-09-05: qty 2

## 2020-09-05 NOTE — ED Triage Notes (Signed)
Patient reports fever/chills, body aches X several days. Patient reports her husband has been around coworkers who were diagnosed with COVID. Patient is fully vaccinated.

## 2020-09-06 ENCOUNTER — Emergency Department (HOSPITAL_COMMUNITY): Payer: Managed Care, Other (non HMO)

## 2020-09-06 LAB — URINALYSIS, ROUTINE W REFLEX MICROSCOPIC
Bilirubin Urine: NEGATIVE
Glucose, UA: NEGATIVE mg/dL
Hgb urine dipstick: NEGATIVE
Ketones, ur: 80 mg/dL — AB
Nitrite: NEGATIVE
Protein, ur: NEGATIVE mg/dL
Specific Gravity, Urine: 1.013 (ref 1.005–1.030)
pH: 6 (ref 5.0–8.0)

## 2020-09-06 LAB — BASIC METABOLIC PANEL
Anion gap: 10 (ref 5–15)
BUN: <5 mg/dL — ABNORMAL LOW (ref 6–20)
CO2: 24 mmol/L (ref 22–32)
Calcium: 9.6 mg/dL (ref 8.9–10.3)
Chloride: 103 mmol/L (ref 98–111)
Creatinine, Ser: 1.18 mg/dL — ABNORMAL HIGH (ref 0.44–1.00)
GFR calc Af Amer: >60 mL/min (ref >60)
GFR calc non Af Amer: 56 mL/min — ABNORMAL LOW (ref >60)
Glucose, Bld: 104 mg/dL — ABNORMAL HIGH (ref 70–99)
Potassium: 4.6 mmol/L (ref 3.5–5.1)
Sodium: 137 mmol/L (ref 135–145)

## 2020-09-06 LAB — RESPIRATORY PANEL BY PCR

## 2020-09-06 LAB — SARS CORONAVIRUS 2 BY RT PCR (HOSPITAL ORDER, PERFORMED IN ~~LOC~~ HOSPITAL LAB): SARS Coronavirus 2: NEGATIVE

## 2020-09-06 MED ORDER — SODIUM CHLORIDE 0.9 % IV BOLUS
1000.0000 mL | Freq: Once | INTRAVENOUS | Status: AC
  Administered 2020-09-06: 1000 mL via INTRAVENOUS

## 2020-09-06 MED ORDER — TOPIRAMATE 25 MG PO TABS
100.0000 mg | ORAL_TABLET | Freq: Once | ORAL | Status: AC
  Administered 2020-09-06: 100 mg via ORAL
  Filled 2020-09-06: qty 4

## 2020-09-06 MED ORDER — PROMETHAZINE HCL 25 MG/ML IJ SOLN
25.0000 mg | Freq: Once | INTRAMUSCULAR | Status: AC
  Administered 2020-09-06: 25 mg via INTRAVENOUS
  Filled 2020-09-06: qty 1

## 2020-09-06 MED ORDER — MORPHINE SULFATE (PF) 4 MG/ML IV SOLN
4.0000 mg | Freq: Once | INTRAVENOUS | Status: AC
  Administered 2020-09-06: 4 mg via INTRAVENOUS
  Filled 2020-09-06: qty 1

## 2020-09-06 MED ORDER — PANTOPRAZOLE SODIUM 40 MG IV SOLR
40.0000 mg | Freq: Once | INTRAVENOUS | Status: AC
  Administered 2020-09-06: 40 mg via INTRAVENOUS
  Filled 2020-09-06: qty 40

## 2020-09-06 NOTE — ED Notes (Signed)
Waiting  For iv team

## 2020-09-06 NOTE — ED Provider Notes (Signed)
[facility] EMERGENCY DEPARTMENT Provider Note   CSN: [id number] Arrival date & time: 09/05/20  2249     History Chief Complaint  Patient presents with  . Fever    [name] Horn is a 44 y.o. female.  Pt presents to the ED today with fever and chills.  Pt said sx started on 9/10.  She had IVIG on Thursday and initially thought it was due to that.  Her husband was exposed to Covid at work.  She's been immunized, but has an immunodeficiency.  Pt has some flank pain and is not urinating very much.  She feels generally bad.        Past Medical History:  Diagnosis Date  . Complication of anesthesia    " i TAKE A LIITLE BIT LONGER TO [place] UP "  . DVT (deep venous thrombosis) ([facility])    x2  . Gastroparesis 08/07/2019  . 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 Herpes labialis 08/12/2020  . Intertrigo 06/22/2020  . Lymphedema   . RA (rheumatoid arthritis) ([place])   . Rheumatoid arthritis ([place]) 08/12/2020  . Transaminitis 08/12/2020  . Uterine cancer [facility])     Patient Active Problem List   Diagnosis Date Noted  . Stomatitis 09/03/2020  . Rheumatoid arthritis ([place]) 08/12/2020  . Herpes labialis 08/12/2020  . Transaminitis 08/12/2020  . 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 Intertrigo 06/22/2020  . Interstitial cystitis 05/18/2020  . 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 Chronic rhinitis 04/23/2020  . Adverse food reaction 04/23/2020  . Eosinophilic esophagitis 30/16/0109  . Oral candidiasis 04/02/2020  . History of Clostridium difficile infection 04/02/2020  . History of shingles 04/02/2020  . History of asthma 04/02/2020  . Multiple drug allergies 04/02/2020  . Environmental allergies 04/02/2020  . History of loop recorder 01/19/2020  . PVC (premature ventricular contraction) 01/19/2020  . Uterine cancer ([place]) 01/19/2020  . Colitis 08/14/2019  .  Constipation   . Gastroparesis 04/03/2019  . Palpitations 04/10/2018  . Nausea and vomiting 04/26/2017  . Nausea & vomiting 04/25/2017  . Chest pain 04/25/2017  . Enterocolitis due to Clostridium difficile 01/04/2016  . Incisional hernia without obstruction or gangrene 11/18/2015  . Personal history of other diseases of the nervous system and sense organs 10/14/2015  . Disease of pancreas 01/21/2015  . Other specified bacterial agents as the cause of diseases classified elsewhere 01/21/2015  . Radiculopathy of cervical region 12/09/2014  . Annual physical exam 01/30/2012  . Melena 01/30/2012  . Pain in ankle 11/24/2010  . ANXIETY 10/11/2010  . DVT 10/11/2010  . COSTOCHONDRITIS 03/03/2008  . Cellulitis of toe 12/01/2007  . MENOPAUSE, PREMATURE 07/20/2007  . UTERINE CANCER, HX OF 07/16/2007  . Other postprocedural status(V45.89) 07/16/2007    Past Surgical History:  Procedure Laterality Date  . APPENDECTOMY    . CHOLECYSTECTOMY    . ESOPHAGOGASTRODUODENOSCOPY N/A 04/30/2017   Procedure: ESOPHAGOGASTRODUODENOSCOPY (EGD);  Surgeon: Teena Irani, MD;  Location[facility] ENDOSCOPY;  Service: Endoscopy;  Laterality: N/A;  . HERNIA REPAIR     x2  . KNEE SURGERY     x3  . MINIMALLY INVASIVE FORAMINOTOMY CERVICAL SPINE     C6-T1, Nitka  . SAVORY DILATION N/A 04/30/2017   Procedure: SAVORY DILATION;  Surgeon: Teena Irani, MD;  Location: [facility] ENDOSCOPY;  Service: Endoscopy;  Laterality: N/A;  . TONSILLECTOMY    . TOTAL ABDOMINAL HYSTERECTOMY     Sarcoma, s/p  XRT     OB History   No obstetric history on file.     Family History  Problem Relation Age of Onset  . Hypertension Other   . Hyperlipidemia Other   . Colon cancer Other        grandmother  . Hashimoto's thyroiditis Mother   . Eczema Mother   . Angioedema Mother   . Throat cancer Father   . Arrhythmia Father   . Angioedema Maternal Grandfather     Social History   Tobacco Use  . Smoking status: Passive Smoke Exposure -  Never Smoker  . Smokeless tobacco: Never Used  Vaping Use  . Vaping Use: Never used  Substance Use Topics  . Alcohol use: Not Currently  . Drug use: No    Home Medications Prior to Admission medications   Medication Sig Start Date End Date Taking? Authorizing Provider  acyclovir (ZOVIRAX) 400 MG tablet Take 400 mg by mouth 3 (three) times daily.  Patient not taking: Reported on 09/03/2020 03/03/20   [provider]  amphetamine-dextroamphetamine (ADDERALL) 20 MG tablet Take 20 mg by mouth 3 (three) times daily.    [provider]  ANTACID 200-200-20 MG/5ML suspension Take by mouth. 09/02/20   [provider]  atropine 1 % ophthalmic solution Place 1 drop into the left eye daily as needed (for dry eye).    [provider]  baclofen (LIORESAL) 10 MG tablet Take 10 mg by mouth daily. 02/28/20   [provider]  Bepotastine Besilate (BEPREVE) 1.5 % SOLN Place 1 drop into both eyes 2 (two) times a day.    [provider]  cycloSPORINE (RESTASIS) 0.05 % ophthalmic emulsion Place 1 drop into both eyes 2 (two) times daily.    [provider]  dicyclomine (BENTYL) 10 MG capsule Take 10 mg by mouth daily as needed for nausea/vomiting. 01/02/19   [provider]  fluconazole (DIFLUCAN) 100 MG tablet Take 2 tablets (200 mg total) by mouth daily. 09/03/20   Michel Bickers, MD  folic acid (FOLVITE) 1 MG tablet Take 2 tablets (2 mg total) by mouth daily. 10/30/19   Bo Merino, MD  furosemide (LASIX) 20 MG tablet Take 20 mg by mouth daily as needed.     [provider]  hyoscyamine (LEVSIN, ANASPAZ) 0.125 MG tablet Take 0.125 mg by mouth every 4 (four) hours as needed for cramping.  07/25/17   [provider]  LORazepam (ATIVAN) 1 MG tablet Take 1 mg by mouth 3 (three) times daily as needed. 03/08/20   [provider]  methotrexate 50 MG/2ML injection Inject 0.54mL into the skin once weekly. 06/24/20   Ofilia Neas,  PA-C  metoCLOPramide (REGLAN) 10 MG tablet Take 10 mg by mouth daily.     [provider]  metoprolol succinate (TOPROL-XL) 25 MG 24 hr tablet Take 25 mg by mouth daily.    [provider]  nystatin (MYCOSTATIN) 100000 UNIT/ML suspension Take 10 mLs by mouth 3 (three) times daily. Patient not taking: Reported on 09/03/2020 02/25/20   [provider]  ondansetron (ZOFRAN-ODT) 4 MG disintegrating tablet Take 1 tablet (4 mg total) by mouth every 8 (eight) hours as needed for nausea or vomiting. 08/05/19   Charlann Lange, PA-C  oxyCODONE-acetaminophen (PERCOCET) 7.5-325 MG tablet  03/31/20   [provider]  pantoprazole (PROTONIX) 40 MG tablet Take 1 tablet (40 mg total) by mouth 2 (two) times daily. 05/02/17 08/04/28  Elgergawy, Silver Huguenin, MD  Polyethyl Glycol-Propyl  Glycol (SYSTANE) 0.4-0.3 % GEL ophthalmic gel Place 1 application into both eyes daily as needed (dryness).    [provider]  potassium chloride (K-DUR) 10 MEQ tablet Take 10 mEq by mouth daily as needed.     [provider]  pravastatin (PRAVACHOL) 40 MG tablet Take 40 mg by mouth daily.    [provider]  prednisoLONE acetate (PRED FORTE) 1 % ophthalmic suspension Place 1 drop into the left eye 2 (two) times daily.    [provider]  promethazine (PHENERGAN) 25 MG suppository Place 1 suppository (25 mg total) rectally every 6 (six) hours as needed for nausea or vomiting. 08/05/19   Charlann Lange, PA-C  SUMAtriptan (IMITREX) 25 MG tablet Take 25 mg by mouth every 2 (two) hours as needed for migraine. May repeat in 2 hours if headache persists or recurs.    [provider]  topiramate (TOPAMAX) 100 MG tablet Take 100 mg by mouth daily. 06/18/19   [provider]  topiramate (TOPAMAX) 25 MG tablet Take 25 mg by mouth at bedtime.     [provider]  traZODone (DESYREL) 150 MG tablet Take 150 mg by mouth at bedtime as needed for sleep.    [provider]  triamcinolone lotion (KENALOG) 0.1 % APPLY TOPICALLY TO SCALP AT BEDTIME AS DIRECTED 07/07/20   [provider]  TUBERCULIN SYR 1CC/27GX1/2" (B-D TB SYRINGE 1CC/27GX1/2") 27G X 1/2" 1 ML MISC 12 Syringes by Does not apply route once a week. 10/30/19   Bo Merino, MD  valACYclovir (VALTREX) 1000 MG tablet Take 1 tablet (1,000 mg total) by mouth daily. 08/12/20   Truman Hayward, MD    Allergies    Other, Sulfa antibiotics, Sulfonamide derivatives, and Benadryl [diphenhydramine hcl]  Review of Systems   Review of Systems  Constitutional: Positive for appetite change, chills and fever.  Musculoskeletal: Positive for myalgias.  Neurological: Positive for weakness.  All other systems reviewed and are negative.   Physical Exam Updated Vital Signs BP (!) 104/53   Pulse 89   Temp 98.6 F (37 C)   Resp 18   SpO2 100%   Physical Exam Vitals and nursing note reviewed.  Constitutional:      Appearance: Normal appearance.  HENT:     Head: Normocephalic and atraumatic.     Right Ear: External ear normal.     Left Ear: External ear normal.     Nose: Nose normal.     Comments: Stomatitis and oral thrush (chronic problems)    Mouth/Throat:     Mouth: Mucous membranes are dry.  Eyes:     Extraocular Movements: Extraocular movements intact.     Conjunctiva/sclera: Conjunctivae normal.     Pupils: Pupils are equal, round, and reactive to light.  Cardiovascular:     Rate and Rhythm: Normal rate and regular rhythm.     Pulses: Normal pulses.     Heart sounds: Normal heart sounds.  Pulmonary:     Effort: Pulmonary effort is normal.     Breath sounds: Normal breath sounds.  Abdominal:     General: Abdomen is flat. Bowel sounds are normal.     Palpations: Abdomen is soft.  Musculoskeletal:        General: Normal range of motion.     Cervical back: Normal range of motion and neck supple.  Skin:    General: Skin is warm.     Capillary Refill: Capillary  refill takes less than 2 seconds.  Neurological:     General: No focal deficit present.     Mental Status: She is alert and oriented to person, place, and time.  Psychiatric:        Mood and Affect: Mood normal.        Behavior: Behavior normal.        Thought Content: Thought content normal.        Judgment: Judgment normal.     ED Results / Procedures / Treatments   Labs (all labs ordered are listed, but only abnormal results are displayed) Labs Reviewed  CBC - Abnormal; Notable for the following components:      Result Value   RBC 3.68 (*)    MCV 102.4 (*)    All other components within normal limits  BASIC METABOLIC PANEL - Abnormal; Notable for the following components:   Glucose, Bld 104 (*)    BUN <5 (*)    Creatinine, Ser 1.18 (*)    GFR calc non Af Amer 56 (*)    All other components within normal limits  URINALYSIS, ROUTINE W REFLEX MICROSCOPIC - Abnormal; Notable for the following components:   Ketones, ur 80 (*)    Leukocytes,Ua SMALL (*)    Bacteria, UA RARE (*)    All other components within normal limits  SARS CORONAVIRUS 2 BY RT PCR (HOSPITAL ORDER, [place])  RESPIRATORY PANEL BY PCR  CULTURE, BLOOD (ROUTINE X 2)  CULTURE, BLOOD (ROUTINE X 2)  URINE CULTURE    EKG None  Radiology DG Chest Portable 1 View  Result Date: 09/06/2020 CLINICAL DATA:  Fever and body aches. EXAM: PORTABLE CHEST 1 VIEW COMPARISON:  August 10, 2019 FINDINGS: There is no evidence of acute infiltrate, pleural effusion or pneumothorax. The heart size and mediastinal contours are within normal limits. A radiopaque loop recorder device is noted. The visualized skeletal structures are unremarkable. Radiopaque surgical clips are seen overlying the right upper quadrant. IMPRESSION: No active cardiopulmonary disease. Electronically Signed   By: Virgina Norfolk M.D.   On: 09/06/2020 03:08    Procedures Procedures (including critical care  time)  Medications Ordered in ED Medications  acetaminophen (TYLENOL) tablet 1,000 mg (1,000 mg Oral Given 09/05/20 2332)  sodium chloride 0.9 % bolus 1,000 mL (1,000 mLs Intravenous New Bag/Given 09/06/20 2020)  promethazine (PHENERGAN) injection 25 mg (25 mg Intravenous Given 09/06/20 2022)  topiramate (TOPAMAX) tablet 100 mg (100 mg Oral Given 09/06/20 1935)  pantoprazole (PROTONIX) injection 40 mg (40 mg Intravenous Given 09/06/20 2021)  morphine 4 MG/ML injection 4 mg (4 mg Intravenous Given 09/06/20 2021)    ED Course  I have reviewed the triage vital signs and the nursing notes.  Pertinent labs & imaging results that were available during my care of the patient were reviewed by me and considered in my medical decision making (see chart for details).    MDM Rules/Calculators/A&P                          Source of fever has not been found.  RVP and Covid are neg.  Neg CXR and UA.  No abd pain that is different than nl.   Blood cultures have been sent.  No fever for 24 hrs.  Pt is stable for d/c.  Return if worse.  Final Clinical Impression(s) / ED Diagnoses Final diagnoses:  Dehydration  Fever, unspecified fever cause    Rx / DC Orders ED Discharge  Orders    None       Isla Pence, MD 09/06/20 2233

## 2020-09-07 LAB — URINE CULTURE: Culture: <10000 — AB

## 2020-09-08 ENCOUNTER — Telehealth: Payer: Self-pay

## 2020-09-08 NOTE — Telephone Encounter (Signed)
COVID-19 Pre-Screening Questions:09/08/20  Do you currently have a fever (>100 F), chills or unexplained body aches? NO   Are you currently experiencing new cough, shortness of breath, sore throat, runny nose? NO   Have you been in contact with someone that is currently pending confirmation of Covid19 testing or has been confirmed to have the [place] virus?  NO, BUT WAS AT URGENT CARE OVER WEEKEND WITH A HIGH FEVER. COVID TEST CAME BACK NEGATIVE   **If the patient answers NO to ALL questions -  advise the patient to please call the clinic before coming to the office should any symptoms develop.

## 2020-09-09 ENCOUNTER — Ambulatory Visit: Payer: Managed Care, Other (non HMO)

## 2020-09-09 ENCOUNTER — Ambulatory Visit (INDEPENDENT_AMBULATORY_CARE_PROVIDER_SITE_OTHER): Payer: Managed Care, Other (non HMO) | Admitting: Infectious Disease

## 2020-09-09 ENCOUNTER — Encounter: Payer: Self-pay | Admitting: Infectious Disease

## 2020-09-09 ENCOUNTER — Other Ambulatory Visit: Payer: Self-pay

## 2020-09-09 VITALS — BP 113/73 | HR 80 | Temp 98.3°F | Wt 171.0 lb

## 2020-09-09 DIAGNOSIS — B370 Candidal stomatitis: Secondary | ICD-10-CM

## 2020-09-09 DIAGNOSIS — D808 Other immunodeficiencies with predominantly antibody defects: Secondary | ICD-10-CM | POA: Diagnosis not present

## 2020-09-09 DIAGNOSIS — R509 Fever, unspecified: Secondary | ICD-10-CM | POA: Diagnosis not present

## 2020-09-09 DIAGNOSIS — Z23 Encounter for immunization: Secondary | ICD-10-CM

## 2020-09-09 NOTE — Progress Notes (Signed)
   Covid-19 Vaccination Clinic  Name:  [name] Horn    MRN: [id number] DOB: 09-20-1976  09/09/2020  [name] was observed post Covid-19 immunization for 15 minutes without incident. She was provided with Vaccine Information Sheet and instruction to access the V-Safe system.   [name] was instructed to call 911 with any severe reactions post vaccine: Marland Kitchen Difficulty breathing  . Swelling of face and throat  . A fast heartbeat  . A bad rash all over body  . Dizziness and weakness     Shequita Peplinski T Brooks Sailors

## 2020-09-09 NOTE — Patient Instructions (Signed)
Next appt can be with Dr. Megan Salon or NP if not available with me

## 2020-09-09 NOTE — Progress Notes (Signed)
Subjective:   Chief complaint: Recent problems with fevers myalgias worked up in the ER  Patient ID: [name] Horn, female    DOB: [birth year], 44 y.o.   MRN: [id number]  HPI   44 year old Caucasian lady with hx of seropositive RA with keratoconjunctivitis sicca, and osteoarthritis.who  Started on MTX in November of  2020. She also  Has hx of eosinophilic esophagitis and was on fluticasone. She has had history of C difficile with 3 episodes. She  Had problems with recurrent strep throat and had tonsillectomy, pneumonia a few times, ear infection, tooth abscess. She  Has had  Shingles outbreak already. She then began to have thrush in January  which was attributed to her  Oral corticosteroid therapy but  Has persisted despite this being stopped.   She wastaking nystatin swish and  Swallow and previously twice weekly fluconazole at 200 mg  While her  Overall immunoglobulin levels were normal she did not mount immune response to vaccination with pneumovax and prevnar with antibodies checked > 4 months after this that were low.  She was bitten by a cat and recently had tetanus vaccine in ER and antibiotics.  After 2 COVID vaccines she had not much of a response in terms of flu like  Symptoms etc that are typically experienced by patients with 2 dose mRNA based vaccines.   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  When I last saw her she was suffering from intertrigo.  I increased her fluconazole 200 mg daily prophylactic dose and a higher dose we are treating her.  She is continued to have abnormalities on her tongue but it is not clear to me that this is really thrush but could be something else such as geographic tongue for example.  I ended up taking her off of the fluconazole with desire to see how she did off of it.  She ended up developing stomatitis but missed her appointment with Dr.  Megan Salon was late again and then finally did see him he did a swab for fungal culture which is growing copious Candida albicans I have asked the lab to send sensitivity data he also placed her on 200 mg of fluconazole daily and her lesions seem to have improved and I have taken a picture of them today.  I still am wondering why she should have such a problem with thrush and specifically now potentially have developed some nasal resistance albeit one that seems to be over come with higher dose of fluconazole.  She also was seen in the ER recently with high fevers and myalgias and malaise.  She tested negative for Covid and had a chest x-ray which was unremarkable and other lab work was unremarkable blood cultures that were negative she did not have a CT scan.     Past Medical History:  Diagnosis Date  . Complication of anesthesia    " i TAKE A LIITLE BIT LONGER TO [place] UP "  . DVT (deep venous thrombosis) ([place])    x2  . Gastroparesis 08/07/2019  . 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 Herpes labialis 08/12/2020  . Intertrigo 06/22/2020  . Lymphedema   . RA (rheumatoid arthritis) ([place])   . Rheumatoid arthritis (Uncertain) 08/12/2020  . Transaminitis 08/12/2020  . Uterine cancer [facility])     Past Surgical History:  Procedure Laterality Date  . APPENDECTOMY    . CHOLECYSTECTOMY    .  ESOPHAGOGASTRODUODENOSCOPY N/A 04/30/2017   Procedure: ESOPHAGOGASTRODUODENOSCOPY (EGD);  Surgeon: Teena Irani, MD;  Location: [facility] ENDOSCOPY;  Service: Endoscopy;  Laterality: N/A;  . HERNIA REPAIR     x2  . KNEE SURGERY     x3  . MINIMALLY INVASIVE FORAMINOTOMY CERVICAL SPINE     C6-T1, Nitka  . SAVORY DILATION N/A 04/30/2017   Procedure: SAVORY DILATION;  Surgeon: Teena Irani, MD;  Location: [facility] ENDOSCOPY;  Service: Endoscopy;  Laterality: N/A;  . TONSILLECTOMY    . TOTAL ABDOMINAL HYSTERECTOMY     Sarcoma, s/p XRT    Family History  Problem Relation Age of Onset  . Hypertension Other   . Hyperlipidemia Other   .  Colon cancer Other        grandmother  . Hashimoto's thyroiditis Mother   . Eczema Mother   . Angioedema Mother   . Throat cancer Father   . Arrhythmia Father   . Angioedema Maternal Grandfather       Social History   Socioeconomic History  . Marital status: Married    Spouse name: Not on file  . Number of children: Not on file  . Years of education: Not on file  . Highest education level: Not on file  Occupational History  . Not on file  Tobacco Use  . Smoking status: Passive Smoke Exposure - Never Smoker  . Smokeless tobacco: Never Used  Vaping Use  . Vaping Use: Never used  Substance and Sexual Activity  . Alcohol use: Not Currently  . Drug use: No  . Sexual activity: Yes    Partners: Male    Birth control/protection: None  Other Topics Concern  . Not on file  Social History Narrative  . Not on file   Social Determinants of Health   Financial Resource Strain:   . Difficulty of Paying Living Expenses: Not on file  Food Insecurity:   . Worried About Charity fundraiser in the Last Year: Not on file  . Ran Out of Food in the Last Year: Not on file  Transportation Needs:   . Lack of Transportation (Medical): Not on file  . Lack of Transportation (Non-Medical): Not on file  Physical Activity:   . Days of Exercise per Week: Not on file  . Minutes of Exercise per Session: Not on file  Stress:   . Feeling of Stress : Not on file  Social Connections:   . Frequency of Communication with Friends and Family: Not on file  . Frequency of Social Gatherings with Friends and Family: Not on file  . Attends Religious Services: Not on file  . Active Member of Clubs or Organizations: Not on file  . Attends Archivist Meetings: Not on file  . Marital Status: Not on file    Allergies  Allergen Reactions  . Other     Dermabond surgical glue.   . Sulfa Antibiotics Anaphylaxis, Shortness Of Breath, Swelling and Rash    Angioedema (also)  . Sulfonamide Derivatives  Hives, Shortness Of Breath and Swelling    TONGUE SWELLS  . Benadryl [Diphenhydramine Hcl] Other (See Comments)    Hyperactivity      Current Outpatient Medications:  .  amphetamine-dextroamphetamine (ADDERALL) 20 MG tablet, Take 20 mg by mouth 3 (three) times daily., Disp: , Rfl:  .  ANTACID 200-200-20 MG/5ML suspension, Take by mouth., Disp: , Rfl:  .  atropine 1 % ophthalmic solution, Place 1 drop into the left eye daily as needed (for dry eye).,  Disp: , Rfl:  .  baclofen (LIORESAL) 10 MG tablet, Take 10 mg by mouth daily., Disp: , Rfl:  .  Bepotastine Besilate (BEPREVE) 1.5 % SOLN, Place 1 drop into both eyes 2 (two) times a day., Disp: , Rfl:  .  cycloSPORINE (RESTASIS) 0.05 % ophthalmic emulsion, Place 1 drop into both eyes 2 (two) times daily., Disp: , Rfl:  .  dicyclomine (BENTYL) 10 MG capsule, Take 10 mg by mouth daily as needed for nausea/vomiting., Disp: , Rfl:  .  fluconazole (DIFLUCAN) 100 MG tablet, Take 2 tablets (200 mg total) by mouth daily., Disp: 60 tablet, Rfl: 0 .  folic acid (FOLVITE) 1 MG tablet, Take 2 tablets (2 mg total) by mouth daily., Disp: 180 tablet, Rfl: 3 .  furosemide (LASIX) 20 MG tablet, Take 20 mg by mouth daily as needed. , Disp: , Rfl:  .  hyoscyamine (LEVSIN, ANASPAZ) 0.125 MG tablet, Take 0.125 mg by mouth every 4 (four) hours as needed for cramping. , Disp: , Rfl:  .  LORazepam (ATIVAN) 1 MG tablet, Take 1 mg by mouth 3 (three) times daily as needed., Disp: , Rfl:  .  methotrexate 50 MG/2ML injection, Inject 0.33mL into the skin once weekly., Disp: 10 mL, Rfl: 0 .  metoCLOPramide (REGLAN) 10 MG tablet, Take 10 mg by mouth daily. , Disp: , Rfl:  .  metoprolol succinate (TOPROL-XL) 25 MG 24 hr tablet, Take 25 mg by mouth daily., Disp: , Rfl:  .  nystatin (MYCOSTATIN) 100000 UNIT/ML suspension, Take 10 mLs by mouth 3 (three) times daily. , Disp: , Rfl:  .  ondansetron (ZOFRAN-ODT) 4 MG disintegrating tablet, Take 1 tablet (4 mg total) by mouth every 8  (eight) hours as needed for nausea or vomiting., Disp: 15 tablet, Rfl: 0 .  oxyCODONE-acetaminophen (PERCOCET) 7.5-325 MG tablet, , Disp: , Rfl:  .  pantoprazole (PROTONIX) 40 MG tablet, Take 1 tablet (40 mg total) by mouth 2 (two) times daily., Disp: 60 tablet, Rfl: 1 .  Polyethyl Glycol-Propyl Glycol (SYSTANE) 0.4-0.3 % GEL ophthalmic gel, Place 1 application into both eyes daily as needed (dryness)., Disp: , Rfl:  .  potassium chloride (K-DUR) 10 MEQ tablet, Take 10 mEq by mouth daily as needed. , Disp: , Rfl:  .  pravastatin (PRAVACHOL) 40 MG tablet, Take 40 mg by mouth daily., Disp: , Rfl:  .  prednisoLONE acetate (PRED FORTE) 1 % ophthalmic suspension, Place 1 drop into the left eye 2 (two) times daily., Disp: , Rfl:  .  promethazine (PHENERGAN) 25 MG suppository, Place 1 suppository (25 mg total) rectally every 6 (six) hours as needed for nausea or vomiting., Disp: 12 each, Rfl: 0 .  SUMAtriptan (IMITREX) 25 MG tablet, Take 25 mg by mouth every 2 (two) hours as needed for migraine. May repeat in 2 hours if headache persists or recurs., Disp: , Rfl:  .  topiramate (TOPAMAX) 100 MG tablet, Take 100 mg by mouth daily., Disp: , Rfl:  .  topiramate (TOPAMAX) 25 MG tablet, Take 25 mg by mouth at bedtime. , Disp: , Rfl:  .  traZODone (DESYREL) 150 MG tablet, Take 150 mg by mouth at bedtime as needed for sleep., Disp: , Rfl:  .  triamcinolone lotion (KENALOG) 0.1 %, APPLY TOPICALLY TO SCALP AT BEDTIME AS DIRECTED, Disp: , Rfl:  .  TUBERCULIN SYR 1CC/27GX1/2" (B-D TB SYRINGE 1CC/27GX1/2") 27G X 1/2" 1 ML MISC, 12 Syringes by Does not apply route once a week., Disp: 12 each, Rfl:  3 .  valACYclovir (VALTREX) 1000 MG tablet, Take 1 tablet (1,000 mg total) by mouth daily., Disp: 30 tablet, Rfl: 5 .  acyclovir (ZOVIRAX) 400 MG tablet, Take 400 mg by mouth 3 (three) times daily.  (Patient not taking: Reported on 09/09/2020), Disp: , Rfl:      Review of Systems  Constitutional: Positive for chills,  diaphoresis, fatigue and fever. Negative for activity change, appetite change and unexpected weight change.  HENT: Negative for congestion, rhinorrhea, sinus pressure, sneezing, sore throat and trouble swallowing.   Eyes: Negative for photophobia and visual disturbance.  Respiratory: Negative for cough, chest tightness, wheezing and stridor.   Cardiovascular: Negative for chest pain and palpitations.  Gastrointestinal: Negative for abdominal distention, abdominal pain, anal bleeding, blood in stool, constipation, diarrhea, nausea and vomiting.  Genitourinary: Negative for difficulty urinating, dysuria, flank pain and hematuria.  Musculoskeletal: Positive for myalgias. Negative for arthralgias, back pain, gait problem and joint swelling.  Skin: Negative for color change, pallor and wound.  Neurological: Negative for dizziness, tremors, weakness and light-headedness.  Hematological: Negative for adenopathy. Does not bruise/bleed easily.  Psychiatric/Behavioral: Negative for agitation, behavioral problems, confusion, decreased concentration, dysphoric mood and sleep disturbance.       Objective:   Physical Exam Constitutional:      General: She is not in acute distress.    Appearance: Normal appearance. She is well-developed. She is not ill-appearing or diaphoretic.  HENT:     Head: Normocephalic and atraumatic.     Right Ear: Hearing and external ear normal.     Left Ear: Hearing and external ear normal.     Nose: No nasal deformity or rhinorrhea.     Mouth/Throat:     Mouth: Mucous membranes are moist.     Pharynx: No posterior oropharyngeal erythema.   Eyes:     General: No scleral icterus.    Extraocular Movements: Extraocular movements intact.     Conjunctiva/sclera: Conjunctivae normal.     Right eye: Right conjunctiva is not injected.     Left eye: Left conjunctiva is not injected.  Neck:     Vascular: No JVD.  Cardiovascular:     Rate and Rhythm: Normal rate and regular  rhythm.     Heart sounds: S1 normal and S2 normal. No murmur heard.  No gallop.   Pulmonary:     Effort: Pulmonary effort is normal. No respiratory distress.     Breath sounds: No stridor. No wheezing or rhonchi.  Abdominal:     General: Abdomen is flat. There is no distension.     Palpations: Abdomen is soft. There is no mass.  Musculoskeletal:        General: Normal range of motion.     Right shoulder: Normal.     Left shoulder: Normal.     Cervical back: Normal range of motion and neck supple.     Right hip: Normal.     Left hip: Normal.     Right knee: Normal.     Left knee: Normal.  Lymphadenopathy:     Head:     Right side of head: No submandibular, preauricular or posterior auricular adenopathy.     Left side of head: No submandibular, preauricular or posterior auricular adenopathy.     Cervical: No cervical adenopathy.     Right cervical: No superficial or deep cervical adenopathy.    Left cervical: No superficial or deep cervical adenopathy.  Skin:    General: Skin is warm and dry.  Coloration: Skin is not pale.     Findings: No abrasion, bruising, ecchymosis, erythema, lesion or rash.     Nails: There is no clubbing.  Neurological:     General: No focal deficit present.     Mental Status: She is alert and oriented to person, place, and time. Mental status is at baseline.     Sensory: No sensory deficit.     Coordination: Coordination normal.     Gait: Gait normal.  Psychiatric:        Attention and Perception: Attention and perception normal. She is attentive.        Mood and Affect: Mood is anxious.        Speech: Speech normal.        Behavior: Behavior normal. Behavior is cooperative.        Thought Content: Thought content normal.        Cognition and Memory: Cognition and memory normal.        Judgment: Judgment normal.    Op lesions from photograph she showed me several visits ago.    Intertrigo at last visit      Tongue today  August 12, 2020:    Lips when seen by Dr. Megan Salon:     Tongue and lips today 09/09/2020:        Assessment & Plan:   Oral lesions: Very strange if this is indeed due to thrush that is now becoming resistant to [place] therapy we will continue 200 mg of fluconazole and she is on course to finish a 30-day supply of medication.  I would like her to be seen prior to this being stopped.  I have asked for the Candida that is been isolated be sent for susceptibility testing.  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  She is being followed by immunology as well as rheumatology  Rheumatology patient currently on methotrexate.  Transaminitis consideration was being given for methotrexate which was reduced   FUO: No clear-cut cause of her fevers I am repeating some basic labs today.  Certainly should they return she should have a CT chest abdomen pelvis.  COVID 19 prevention I would be in favor of her getting a third dose of COVID-19 vaccine which we will get her today.  I spent greater than 40 minutes with the patient including greater than 50% of time in face to face counsel of the patient re her condition her oral lesions and in coordination of her care.

## 2020-09-10 LAB — COMPLETE METABOLIC PANEL WITH GFR
AG Ratio: 1.4 (calc) (ref 1.0–2.5)
ALT: 14 U/L (ref 6–29)
AST: 24 U/L (ref 10–35)
Albumin: 4 g/dL (ref 3.6–5.1)
Alkaline phosphatase (APISO): 63 U/L (ref 31–125)
BUN/Creatinine Ratio: 6 (calc) (ref 6–22)
BUN: 6 mg/dL — ABNORMAL LOW (ref 7–25)
CO2: 27 mmol/L (ref 20–32)
Calcium: 9.1 mg/dL (ref 8.6–10.2)
Chloride: 108 mmol/L (ref 98–110)
Creat: 0.93 mg/dL (ref 0.50–1.10)
GFR, Est African American: 86 mL/min/{1.73_m2} (ref >=60)
GFR, Est Non African American: 74 mL/min/{1.73_m2} (ref >=60)
Globulin: 2.8 g/dL (calc) (ref 1.9–3.7)
Glucose, Bld: 104 mg/dL — ABNORMAL HIGH (ref 65–99)
Potassium: 4.2 mmol/L (ref 3.5–5.3)
Sodium: 141 mmol/L (ref 135–146)
Total Bilirubin: 0.4 mg/dL (ref 0.2–1.2)
Total Protein: 6.8 g/dL (ref 6.1–8.1)

## 2020-09-10 LAB — CBC WITH DIFFERENTIAL/PLATELET
Absolute Monocytes: 465 cells/uL (ref 200–950)
Basophils Absolute: 51 cells/uL (ref 0–200)
Basophils Relative: 1.1 %
Eosinophils Absolute: 336 cells/uL (ref 15–500)
Eosinophils Relative: 7.3 %
HCT: 33.5 % — ABNORMAL LOW (ref 35.0–45.0)
Hemoglobin: 11.2 g/dL — ABNORMAL LOW (ref 11.7–15.5)
Lymphs Abs: 1973 cells/uL (ref 850–3900)
MCH: 33.3 pg — ABNORMAL HIGH (ref 27.0–33.0)
MCHC: 33.4 g/dL (ref 32.0–36.0)
MCV: 99.7 fL (ref 80.0–100.0)
MPV: 10 fL (ref 7.5–12.5)
Monocytes Relative: 10.1 %
Neutro Abs: 1776 cells/uL (ref 1500–7800)
Neutrophils Relative %: 38.6 %
Platelets: 273 10*3/uL (ref 140–400)
RBC: 3.36 10*6/uL — ABNORMAL LOW (ref 3.80–5.10)
RDW: 14.8 % (ref 11.0–15.0)
Total Lymphocyte: 42.9 %
WBC: 4.6 10*3/uL (ref 3.8–10.8)

## 2020-09-10 LAB — C-REACTIVE PROTEIN: CRP: 2.2 mg/L (ref <8.0)

## 2020-09-10 LAB — CK: Total CK: 488 U/L — ABNORMAL HIGH (ref 29–143)

## 2020-09-10 LAB — SEDIMENTATION RATE: Sed Rate: 11 mm/h (ref 0–20)

## 2020-09-10 NOTE — Progress Notes (Signed)
Rheumatoid arthritis should not cause elevated CK.  I noticed that she is on statins which can cause elevation of CK. I will suggest repeating CK, aldolase, myositis panel, HMG-CoA reductase antibody, ANA.  We can have patient come to our office and get these labs. Thank you

## 2020-09-11 ENCOUNTER — Other Ambulatory Visit: Payer: Self-pay

## 2020-09-11 ENCOUNTER — Other Ambulatory Visit: Payer: Self-pay | Admitting: Radiology

## 2020-09-11 DIAGNOSIS — M0579 Rheumatoid arthritis with rheumatoid factor of multiple sites without organ or systems involvement: Secondary | ICD-10-CM

## 2020-09-11 DIAGNOSIS — R748 Abnormal levels of other serum enzymes: Secondary | ICD-10-CM

## 2020-09-11 LAB — CULTURE, BLOOD (ROUTINE X 2)
Culture: NO GROWTH
Culture: NO GROWTH
Special Requests: ADEQUATE

## 2020-09-12 LAB — CK: Total CK: 144 U/L — ABNORMAL HIGH (ref 29–143)

## 2020-09-17 LAB — YEAST SUSCEPTIBILITY, CUSTOM MIC, 4 DRUG
MIC: 0.015
MIC: 0.03
MIC: 0.5
MIC: <=0.008

## 2020-09-17 LAB — TEST AUTHORIZATION

## 2020-09-17 LAB — CULTURE, YEAST, WITH IDENTIFICATION
Micro Number:: 10933343
Specimen Quality:: ADEQUATE

## 2020-09-17 NOTE — Progress Notes (Signed)
CK is borderline elevated-144.  143 is normal.  Aldolase WNL.  ANA negative.

## 2020-09-23 LAB — MYOSITIS ASSESSR PLUS JO-1 AUTOABS
EJ Autoabs: NOT DETECTED
Jo-1 Autoabs: <1 AI
Ku Autoabs: NOT DETECTED
Mi-2 Autoabs: NOT DETECTED
OJ Autoabs: NOT DETECTED
PL-12 Autoabs: NOT DETECTED
PL-7 Autoabs: NOT DETECTED
SRP Autoabs: NOT DETECTED

## 2020-09-23 LAB — ALDOLASE: Aldolase: 3.9 U/L (ref <=8.1)

## 2020-09-23 LAB — ANA: Anti Nuclear Antibody (ANA): NEGATIVE

## 2020-09-23 LAB — HMGCR AB (IGG): HMGCR AB (IGG): <2 CU (ref <20)

## 2020-09-23 NOTE — Progress Notes (Signed)
LFTs WNL on 09/09/20.  If she is clinically doing well and has not had any recent flares I would recommend continuing on the reduce dose of MTX.  If she starts to develop increased joint pain or joint swelling she can try increasing MTX to 0.6 ml sq injections once weekly.  Recheck lab work 2 weeks after increasing the dose.

## 2020-09-23 NOTE — Progress Notes (Signed)
Myositis panel negative.  HMGCR ab negative.

## 2020-09-30 ENCOUNTER — Telehealth: Payer: Self-pay | Admitting: *Deleted

## 2020-09-30 ENCOUNTER — Other Ambulatory Visit (HOSPITAL_COMMUNITY): Payer: Self-pay

## 2020-09-30 NOTE — Telephone Encounter (Signed)
Patient called, states she is chronically dehydrated, worsened by current oral thrush. She has an IVIG infusion scheduled for 10/7, is asking if a bolus of fluids can be added after her IVIG infusion. She feels dizzy often, reports extremely dry mouth and throat.  She is hoping that Dr Tommy Medal would add a bolus tomorrow.  Her IVIG infusion scheduled for 10/7 at 8am, but she is usually there for 3 hours or longer.  Please advise. Landis Gandy, RN

## 2020-10-01 ENCOUNTER — Encounter (HOSPITAL_COMMUNITY)
Admission: RE | Admit: 2020-10-01 | Discharge: 2020-10-01 | Disposition: A | Discharge status: Home / Self Care | Payer: Managed Care, Other (non HMO) | Source: Ambulatory Visit | Attending: Infectious Disease | Admitting: Infectious Disease

## 2020-10-01 ENCOUNTER — Other Ambulatory Visit: Payer: Self-pay

## 2020-10-01 DIAGNOSIS — D808 Other immunodeficiencies with predominantly antibody defects: Secondary | ICD-10-CM | POA: Insufficient documentation

## 2020-10-01 MED ORDER — IMMUNE GLOBULIN (HUMAN) 10 GM/100ML IV SOLN
30.0000 g | INTRAVENOUS | Status: DC
  Administered 2020-10-01: 30 g via INTRAVENOUS
  Filled 2020-10-01: qty 200

## 2020-10-05 ENCOUNTER — Ambulatory Visit (INDEPENDENT_AMBULATORY_CARE_PROVIDER_SITE_OTHER): Payer: Managed Care, Other (non HMO) | Admitting: Family

## 2020-10-05 ENCOUNTER — Other Ambulatory Visit: Payer: Self-pay

## 2020-10-05 ENCOUNTER — Encounter: Payer: Self-pay | Admitting: Family

## 2020-10-05 VITALS — BP 96/68 | HR 80 | Temp 98.2°F | Wt 164.0 lb

## 2020-10-05 DIAGNOSIS — K121 Other forms of stomatitis: Secondary | ICD-10-CM

## 2020-10-05 DIAGNOSIS — B370 Candidal stomatitis: Secondary | ICD-10-CM | POA: Diagnosis not present

## 2020-10-05 DIAGNOSIS — R509 Fever, unspecified: Secondary | ICD-10-CM | POA: Diagnosis not present

## 2020-10-05 HISTORY — DX: Fever, unspecified: R50.9

## 2020-10-05 MED ORDER — FLUCONAZOLE 200 MG PO TABS: 200.0000 mg | ORAL_TABLET | Freq: Every day | ORAL | 1 refills | Status: DC

## 2020-10-05 NOTE — Patient Instructions (Signed)
Nice to see you.  We will check your lab work today.  Continue your current dose of fluconazole.   Plan for follow up with Dr. Tommy Medal in 1 month or sooner if needed.   Have a great day and stay safe!

## 2020-10-05 NOTE — Progress Notes (Signed)
Subjective:    Patient ID: [name] Horn, female    DOB: [birth year], 44 y.o.   MRN: [id number]  Chief Complaint  Patient presents with  . Follow-up     HPI:  [name] Horn is a 44 y.o. female with seropositive RA with keratoconjunctivitis sicca, osteoarthritis on methotrexate and also receiving IVIG with recurrent stomatitis who was last seen in the office by Dr. Tommy Medal on 9/15 presents today for 3 week follow up. Previously started on Fluconazole 269m daily for Candida albicans infection with the following cultures:  Yeast Susceptibility, Custom MIC, 4 Drug Order: [id number]:  Final result Visible to patient:  Yes (not seen) Next appt:  Today at 03:00 PM in Infectious Diseases (Mauricio Po FNP)     1 Result Note Component 1 mo ago  organism CANDIDA ALBICANS   Antimicrobial 1 FLUCONAZOLE   MIC 0.500 S   Antimicrobial 2 VORICONAZOLE   MIC <=0.008 S   Antimicrobial 3 POSACONAZOLE   MIC 0.030   Antimicrobial 4 CASPOFUNGIN   MIC 0.015 S   Comment: .  Drug concentrations are expressed in mcg/mL.  S = Susceptible  S-DD = Susceptible-Dose Dependent  I = Intermediate  R = Resistant        She continues to be followed by Immunology and Rheumatology. During her last office visit she was having fevers of unclear origin. Seen by General Surgery with concern for lower leg mass with differentials being hematoma or abscess. Appears ultrasound was cancelled.   [name] been taking the fluconazole as prescribed with no adverse side effects. Has noted improvements in her symptoms since increasing the dose of fluconazole. Does wax and wane some with occasional lesions that come and go. Has also had a couple of sores located on the right underside of her tongue. Did not feel like a canker sore. Had a fever this past weekend at 102.1 and generally wax and wane. This has been managed with Tylenol as needed. Sees her immunologist towards the end of the year.     Allergies  Allergen Reactions  . Other     Dermabond surgical glue.   . Sulfa Antibiotics Anaphylaxis, Shortness Of Breath, Swelling and Rash    Angioedema (also)  . Sulfonamide Derivatives Hives, Shortness Of Breath and Swelling    TONGUE SWELLS  . Benadryl [Diphenhydramine Hcl] Other (See Comments)    Hyperactivity       Outpatient Medications Prior to Visit  Medication Sig Dispense Refill  . acyclovir (ZOVIRAX) 400 MG tablet Take 400 mg by mouth 3 (three) times daily.     .Marland Kitchenamphetamine-dextroamphetamine (ADDERALL) 20 MG tablet Take 20 mg by mouth 3 (three) times daily.    . ANTACID 200-200-20 MG/5ML suspension Take by mouth.    .Marland Kitchenatropine 1 % ophthalmic solution Place 1 drop into the left eye daily as needed (for dry eye).    . baclofen (LIORESAL) 10 MG tablet Take 10 mg by mouth daily.    . Bepotastine Besilate (BEPREVE) 1.5 % SOLN Place 1 drop into both eyes 2 (two) times a day.    . cycloSPORINE (RESTASIS) 0.05 % ophthalmic emulsion Place 1 drop into both eyes 2 (two) times daily.    .Marland Kitchendicyclomine (BENTYL) 10 MG capsule Take 10 mg by mouth daily as needed for nausea/vomiting.    . folic acid (FOLVITE) 1 MG tablet Take 2 tablets (2 mg total) by mouth daily. 180 tablet 3  . furosemide (LASIX) 20 MG  tablet Take 20 mg by mouth daily as needed.     . hyoscyamine (LEVSIN, ANASPAZ) 0.125 MG tablet Take 0.125 mg by mouth every 4 (four) hours as needed for cramping.     Marland Kitchen LORazepam (ATIVAN) 1 MG tablet Take 1 mg by mouth 3 (three) times daily as needed.    . methotrexate 50 MG/2ML injection Inject 0.56m into the skin once weekly. 10 mL 0  . metoCLOPramide (REGLAN) 10 MG tablet Take 10 mg by mouth daily.     . metoprolol succinate (TOPROL-XL) 25 MG 24 hr tablet Take 25 mg by mouth daily.    .Marland Kitchennystatin (MYCOSTATIN) 100000 UNIT/ML suspension Take 10 mLs by mouth 3 (three) times daily.     . ondansetron (ZOFRAN-ODT) 4 MG disintegrating tablet Take 1 tablet (4 mg total) by mouth every  8 (eight) hours as needed for nausea or vomiting. 15 tablet 0  . oxyCODONE-acetaminophen (PERCOCET) 7.5-325 MG tablet     . pantoprazole (PROTONIX) 40 MG tablet Take 1 tablet (40 mg total) by mouth 2 (two) times daily. 60 tablet 1  . Polyethyl Glycol-Propyl Glycol (SYSTANE) 0.4-0.3 % GEL ophthalmic gel Place 1 application into both eyes daily as needed (dryness).    . potassium chloride (K-DUR) 10 MEQ tablet Take 10 mEq by mouth daily as needed.     . pravastatin (PRAVACHOL) 40 MG tablet Take 40 mg by mouth daily.    . prednisoLONE acetate (PRED FORTE) 1 % ophthalmic suspension Place 1 drop into the left eye 2 (two) times daily.    . promethazine (PHENERGAN) 25 MG suppository Place 1 suppository (25 mg total) rectally every 6 (six) hours as needed for nausea or vomiting. 12 each 0  . SUMAtriptan (IMITREX) 25 MG tablet Take 25 mg by mouth every 2 (two) hours as needed for migraine. May repeat in 2 hours if headache persists or recurs.    . topiramate (TOPAMAX) 100 MG tablet Take 100 mg by mouth daily.    .Marland Kitchentopiramate (TOPAMAX) 25 MG tablet Take 25 mg by mouth at bedtime.     . traZODone (DESYREL) 150 MG tablet Take 150 mg by mouth at bedtime as needed for sleep.    .Marland Kitchentriamcinolone lotion (KENALOG) 0.1 % APPLY TOPICALLY TO SCALP AT BEDTIME AS DIRECTED    . TUBERCULIN SYR 1CC/27GX1/2" (B-D TB SYRINGE 1CC/27GX1/2") 27G X 1/2" 1 ML MISC 12 Syringes by Does not apply route once a week. 12 each 3  . valACYclovir (VALTREX) 1000 MG tablet Take 1 tablet (1,000 mg total) by mouth daily. 30 tablet 5  . fluconazole (DIFLUCAN) 100 MG tablet Take 2 tablets (200 mg total) by mouth daily. 60 tablet 0   No facility-administered medications prior to visit.     Past Medical History:  Diagnosis Date  . Complication of anesthesia    " i TA[place] A [place] LONGER [place] "  . DVT (deep venous thrombosis) ([place]    x2  . Gastroparesis 08/07/2019  . 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 Herpes labialis 08/12/2020  .  Intertrigo 06/22/2020  . Lymphedema   . RA (rheumatoid arthritis) ([place]   . Rheumatoid arthritis ([place] 08/12/2020  . Transaminitis 08/12/2020  . Uterine cancer ([facility]      Past Surgical History:  Procedure Laterality Date  . APPENDECTOMY    . CHOLECYSTECTOMY    . ESOPHAGOGASTRODUODENOSCOPY N/A 04/30/2017   Procedure: ESOPHAGOGASTRODUODENOSCOPY (EGD);  Surgeon: HTeena Irani MD;  [facility];  Service: Endoscopy;  Laterality:  N/A;  . HERNIA REPAIR     x2  . KNEE SURGERY     x3  . MINIMALLY INVASIVE FORAMINOTOMY CERVICAL SPINE     C6-T1, Nitka  . SAVORY DILATION N/A 04/30/2017   Procedure: SAVORY DILATION;  Surgeon: Teena Irani, MD;  [facility] ENDOSCOPY;  Service: Endoscopy;  Laterality: N/A;  . TONSILLECTOMY    . TOTAL ABDOMINAL HYSTERECTOMY     Sarcoma, s/p XRT    Review of Systems  Constitutional: Negative for chills, diaphoresis, fatigue and fever.  Respiratory: Negative for cough, chest tightness, shortness of breath and wheezing.   Cardiovascular: Negative for chest pain.  Gastrointestinal: Negative for abdominal pain, diarrhea, nausea and vomiting.      Objective:    BP 96/68   Pulse 80   Temp 98.2 F (36.8 C) (Oral)   Wt 164 lb (74.4 kg)   BMI 28.15 kg/m  Nursing note and vital signs reviewed.  Physical Exam Constitutional:      General: She is not in acute distress.    Appearance: She is well-developed.  Cardiovascular:     Rate and Rhythm: Normal rate and regular rhythm.     Heart sounds: Normal heart sounds.  Pulmonary:     Effort: Pulmonary effort is normal.     Breath sounds: Normal breath sounds.  Skin:    General: Skin is warm and dry.  Neurological:     Mental Status: She is alert and oriented to person, place, and time.  Psychiatric:        Behavior: Behavior normal.        Thought Content: Thought content normal.        Judgment: Judgment normal.           Depression screen [facility] 2/9 08/12/2020 06/22/2020  Decreased Interest 0 0   Down, Depressed, Hopeless 0 0  PHQ - 2 Score 0 0       Assessment & Plan:    Patient Active Problem List   Diagnosis Date Noted  . FUO (fever of unknown origin) 10/05/2020  . Stomatitis 09/03/2020  . Rheumatoid arthritis ([place]) 08/12/2020  . Herpes labialis 08/12/2020  . Transaminitis 08/12/2020  . 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 Intertrigo 06/22/2020  . Interstitial cystitis 05/18/2020  . 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 Chronic rhinitis 04/23/2020  . Adverse food reaction 04/23/2020  . Eosinophilic esophagitis 56/31/4970  . Oral candidiasis 04/02/2020  . History of Clostridium difficile infection 04/02/2020  . History of shingles 04/02/2020  . History of asthma 04/02/2020  . Multiple drug allergies 04/02/2020  . Environmental allergies 04/02/2020  . History of loop recorder 01/19/2020  . PVC (premature ventricular contraction) 01/19/2020  . Uterine cancer ([place]) 01/19/2020  . Colitis 08/14/2019  . Constipation   . Gastroparesis 04/03/2019  . Palpitations 04/10/2018  . Nausea and vomiting 04/26/2017  . Nausea & vomiting 04/25/2017  . Chest pain 04/25/2017  . Enterocolitis due to Clostridium difficile 01/04/2016  . Incisional hernia without obstruction or gangrene 11/18/2015  . Personal history of other diseases of the nervous system and sense organs 10/14/2015  . Disease of pancreas 01/21/2015  . Other specified bacterial agents as the cause of diseases classified elsewhere 01/21/2015  . Radiculopathy of cervical region 12/09/2014  . Annual physical exam 01/30/2012  . Melena 01/30/2012  . Pain in ankle 11/24/2010  . ANXIETY 10/11/2010  . DVT 10/11/2010  . COSTOCHONDRITIS  03/03/2008  . Cellulitis of toe 12/01/2007  . MENOPAUSE, PREMATURE 07/20/2007  . UTERINE CANCER, HX OF 07/16/2007  . Other postprocedural status(V45.89) 07/16/2007      Problem List Items Addressed This Visit      Digestive   Oral candidiasis    [name] Horn continues to take her fluconazole as prescribed with no adverse side effects with decreased symptoms over the course of the past 3 weeks although occasional "outbreaks" of lesions.  Appears overall improved today.  Given previous circumstances with discontinuation of medication of reemergence we will continue current dose of fluconazole she appears suppressed at this time.  May also consider itraconazole solution as an alternative.  Check blood work for therapeutic drug monitoring.  Plan to continue fluconazole for at least 1 more month.       Relevant Medications   fluconazole (DIFLUCAN) 200 MG tablet   Other Relevant Orders   CBC w/Diff   Comp Met (CMET)   Stomatitis - Primary    No clear evidence of stomatitis today.  Continue to monitor.        Other   FUO (fever of unknown origin)    [name] Horn continues to have fevers periodically most recently 102.1 within the last week.  Managed with Tylenol.  There is no clear evidence of infection although general surgery is evaluating her left calf with concern for hematoma versus abscess.  Previous blood cultures without growth.  Recommend continuing to monitor at this time.      Relevant Orders   CBC w/Diff   Comp Met (CMET)       I have discontinued [name] Horn's fluconazole. I am also having her start on fluconazole. Additionally, I am having her maintain her amphetamine-dextroamphetamine, pantoprazole, hyoscyamine, SUMAtriptan, traZODone, topiramate, Bepotastine Besilate, metoCLOPramide, cycloSPORINE, Polyethyl Glycol-Propyl Glycol, dicyclomine, topiramate, atropine, furosemide, potassium chloride, prednisoLONE acetate, promethazine, ondansetron, TUBERCULIN SYR 5AO/13YQ6/5", folic acid, baclofen, nystatin, acyclovir, LORazepam, oxyCODONE-acetaminophen, methotrexate, pravastatin, metoprolol succinate, triamcinolone lotion, valACYclovir, and  Antacid.   Meds ordered this encounter  Medications  . fluconazole (DIFLUCAN) 200 MG tablet    Sig: Take 1 tablet (200 mg total) by mouth daily.    Dispense:  30 tablet    Refill:  1    Order Specific Question:   Supervising Provider    Answer:   Carlyle Basques [[id number]]     Follow-up: Return in about 1 month (around 11/05/2020), or if symptoms worsen or fail to improve.   Terri Piedra, MSN, FNP-C Nurse Practitioner [facility] for Infectious Disease [place] number: [phone]

## 2020-10-05 NOTE — Assessment & Plan Note (Addendum)
[name] Horn continues to take her fluconazole as prescribed with no adverse side effects with decreased symptoms over the course of the past 3 weeks although occasional "outbreaks" of lesions.  Appears overall improved today.  Given previous circumstances with discontinuation of medication of reemergence we will continue current dose of fluconazole she appears suppressed at this time.  May also consider itraconazole solution as an alternative.  Check blood work for therapeutic drug monitoring.  Plan to continue fluconazole for at least 1 more month.

## 2020-10-05 NOTE — Assessment & Plan Note (Signed)
[name] Horn continues to have fevers periodically most recently 102.1 within the last week.  Managed with Tylenol.  There is no clear evidence of infection although general surgery is evaluating her left calf with concern for hematoma versus abscess.  Previous blood cultures without growth.  Recommend continuing to monitor at this time.

## 2020-10-05 NOTE — Telephone Encounter (Signed)
She can have bolus but I believe she already had it now?

## 2020-10-05 NOTE — Assessment & Plan Note (Signed)
No clear evidence of stomatitis today.  Continue to monitor.

## 2020-10-06 LAB — COMPREHENSIVE METABOLIC PANEL
AG Ratio: 1.3 (calc) (ref 1.0–2.5)
ALT: 9 U/L (ref 6–29)
AST: 20 U/L (ref 10–35)
Albumin: 4 g/dL (ref 3.6–5.1)
Alkaline phosphatase (APISO): 63 U/L (ref 31–125)
BUN: 8 mg/dL (ref 7–25)
CO2: 28 mmol/L (ref 20–32)
Calcium: 9.7 mg/dL (ref 8.6–10.2)
Chloride: 106 mmol/L (ref 98–110)
Creat: 1.02 mg/dL (ref 0.50–1.10)
Globulin: 3.2 g/dL (calc) (ref 1.9–3.7)
Glucose, Bld: 93 mg/dL (ref 65–99)
Potassium: 4.2 mmol/L (ref 3.5–5.3)
Sodium: 139 mmol/L (ref 135–146)
Total Bilirubin: 0.5 mg/dL (ref 0.2–1.2)
Total Protein: 7.2 g/dL (ref 6.1–8.1)

## 2020-10-06 LAB — CBC WITH DIFFERENTIAL/PLATELET
Absolute Monocytes: 362 cells/uL (ref 200–950)
Basophils Absolute: 10 cells/uL (ref 0–200)
Basophils Relative: 0.3 %
Eosinophils Absolute: 122 cells/uL (ref 15–500)
Eosinophils Relative: 3.8 %
HCT: 37.3 % (ref 35.0–45.0)
Hemoglobin: 12.6 g/dL (ref 11.7–15.5)
Lymphs Abs: 1930 cells/uL (ref 850–3900)
MCH: 33.9 pg — ABNORMAL HIGH (ref 27.0–33.0)
MCHC: 33.8 g/dL (ref 32.0–36.0)
MCV: 100.3 fL — ABNORMAL HIGH (ref 80.0–100.0)
MPV: 10.3 fL (ref 7.5–12.5)
Monocytes Relative: 11.3 %
Neutro Abs: 778 cells/uL — ABNORMAL LOW (ref 1500–7800)
Neutrophils Relative %: 24.3 %
Platelets: 204 10*3/uL (ref 140–400)
RBC: 3.72 10*6/uL — ABNORMAL LOW (ref 3.80–5.10)
RDW: 13.1 % (ref 11.0–15.0)
Total Lymphocyte: 60.3 %
WBC: 3.2 10*3/uL — ABNORMAL LOW (ref 3.8–10.8)

## 2020-10-14 ENCOUNTER — Telehealth: Payer: Self-pay

## 2020-10-14 NOTE — Telephone Encounter (Signed)
Patient requesting bolus fluids during IVIG infusion scheduled for 10/29/20 due to dehydration worsened by oral thrush. Routing to provider for advise. [name] Horn

## 2020-10-14 NOTE — Telephone Encounter (Signed)
Yes that is fine if she really sees a fluid bolus.  Is a half a liter okay?

## 2020-10-15 ENCOUNTER — Other Ambulatory Visit: Payer: Self-pay | Admitting: Rheumatology

## 2020-10-15 DIAGNOSIS — M0579 Rheumatoid arthritis with rheumatoid factor of multiple sites without organ or systems involvement: Secondary | ICD-10-CM

## 2020-10-15 NOTE — Telephone Encounter (Signed)
Patient request a refill on MTX vials and syringes to be sent into [facility] on [address].

## 2020-10-15 NOTE — Telephone Encounter (Signed)
Patient is ok with half a liter.

## 2020-10-16 MED ORDER — "TUBERCULIN SYRINGE 27G X 1/2"" 1 ML MISC": 12.0000 | 3 refills | Status: DC

## 2020-10-16 MED ORDER — METHOTREXATE SODIUM CHEMO INJECTION 50 MG/2ML
INTRAMUSCULAR | 0 refills | Status: DC
Start: 2020-10-16 — End: 2021-01-12

## 2020-10-16 NOTE — Telephone Encounter (Signed)
Last Visit: 08/11/2020 Next Visit: 01/12/2021 Labs: 10/05/2020 WBC 3.2, RBC 3.72, MCV 100.3, MCH 33.9, Neutro ABS 778  Current Dose per office note 08/11/2020: MTX 0.8 ml sq once weekly  DX: Rheumatoid arthritis involving multiple sites with positive rheumatoid factor   Okay to refill MTX and syringes?

## 2020-10-29 ENCOUNTER — Encounter (HOSPITAL_COMMUNITY)
Admission: RE | Admit: 2020-10-29 | Discharge: 2020-10-29 | Disposition: A | Discharge status: Home / Self Care | Payer: Managed Care, Other (non HMO) | Source: Ambulatory Visit | Attending: Infectious Disease | Admitting: Infectious Disease

## 2020-10-29 ENCOUNTER — Telehealth: Payer: Self-pay

## 2020-10-29 DIAGNOSIS — D808 Other immunodeficiencies with predominantly antibody defects: Secondary | ICD-10-CM | POA: Diagnosis present

## 2020-10-29 MED ORDER — IMMUNE GLOBULIN (HUMAN) 10 GM/100ML IV SOLN
30.0000 g | INTRAVENOUS | Status: DC
  Administered 2020-10-29: 30 g via INTRAVENOUS
  Filled 2020-10-29: qty 100

## 2020-10-29 MED ORDER — SODIUM CHLORIDE 0.9 % IV BOLUS
500.0000 mL | Freq: Once | INTRAVENOUS | Status: AC
  Administered 2020-10-29: 500 mL via INTRAVENOUS

## 2020-10-29 NOTE — Telephone Encounter (Signed)
Received order per Terri Piedra, NP for 500 ml 0.9 NaCl bolus IV x1 during IVIG infusion. Orders faxed to short stay at [place]

## 2020-11-09 ENCOUNTER — Ambulatory Visit (INDEPENDENT_AMBULATORY_CARE_PROVIDER_SITE_OTHER): Payer: Managed Care, Other (non HMO) | Admitting: Infectious Disease

## 2020-11-09 ENCOUNTER — Other Ambulatory Visit: Payer: Self-pay

## 2020-11-09 ENCOUNTER — Encounter: Payer: Self-pay | Admitting: Infectious Disease

## 2020-11-09 VITALS — BP 129/79 | HR 76 | Temp 98.7°F | Wt 160.0 lb

## 2020-11-09 DIAGNOSIS — K3184 Gastroparesis: Secondary | ICD-10-CM

## 2020-11-09 DIAGNOSIS — K141 Geographic tongue: Secondary | ICD-10-CM

## 2020-11-09 DIAGNOSIS — D808 Other immunodeficiencies with predominantly antibody defects: Secondary | ICD-10-CM | POA: Diagnosis not present

## 2020-11-09 DIAGNOSIS — B001 Herpesviral vesicular dermatitis: Secondary | ICD-10-CM | POA: Diagnosis not present

## 2020-11-09 DIAGNOSIS — D806 Antibody deficiency with near-normal immunoglobulins or with hyperimmunoglobulinemia: Secondary | ICD-10-CM

## 2020-11-09 DIAGNOSIS — B370 Candidal stomatitis: Secondary | ICD-10-CM | POA: Diagnosis not present

## 2020-11-09 DIAGNOSIS — R509 Fever, unspecified: Secondary | ICD-10-CM

## 2020-11-09 DIAGNOSIS — K121 Other forms of stomatitis: Secondary | ICD-10-CM

## 2020-11-09 NOTE — Progress Notes (Signed)
Subjective:   Chief complaint: Complaints of dehydration, left-sided rib pain abdominal pain recent pain in her left calf  Patient ID: [name] Horn, female    DOB: [birth year], 44 y.o.   MRN: [id number]  HPI   44 year old Caucasian lady with hx of seropositive RA with keratoconjunctivitis sicca, and osteoarthritis.who  Started on MTX in November of  2020. She also  Has hx of eosinophilic esophagitis and was on fluticasone. She has had history of C difficile with 3 episodes. She  Had problems with recurrent strep throat and had tonsillectomy, pneumonia a few times, ear infection, tooth abscess. She  Has had  Shingles outbreak already. She then began to have thrush in January  which was attributed to her  Oral corticosteroid therapy but  Has persisted despite this being stopped.   She was taking nystatin swish and  Swallow and previously twice weekly fluconazole at 200 mg  While her  Overall immunoglobulin levels were normal she did not mount immune response to vaccination with pneumovax and prevnar with antibodies checked > 4 months after this that were low.  She was bitten by a cat and recently had tetanus vaccine in ER and antibiotics.  After 2 COVID vaccines she had not much of a response in terms of flu like  Symptoms etc that are typically experienced by patients with 2 dose mRNA based vaccines.   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  During one visit several visits ago she appeared to be suffering from intertrigo.  I increased her fluconazole 200 mg daily prophylactic dose and a higher dose  She is continued to have abnormalities on her tongue but it is not clear to me that this is really thrush but could be something else such as geographic tongue for example.  I ended up taking her off of the fluconazole with desire to see how she did off of it.  She ended up developing  stomatitis but missed her appointment with Dr. Megan Salon was late again and then finally did see him he did a swab for fungal culture which is growing copious Candida albicans   Sensitivity data on her candida showed:  organism CANDIDA ALBICANS   Antimicrobial 1 FLUCONAZOLE   MIC 0.500 S   Antimicrobial 2 VORICONAZOLE   MIC <=0.008 S   Antimicrobial 3 POSACONAZOLE   MIC 0.030   Antimicrobial 4 CASPOFUNGIN   MIC 0.015 S   Comment: .    He is remained on fluconazole at high dose since then and is seen Terri Piedra in the interim.  Her tongue lesions have not resolved though she was telling me that her tongue was "completely pink last week.  Again I feel that her story fits much better with geographic tongue then with candidiasis.  I would like to observe her again off of antifungal therapy.  She tells me she is having some rib pain that is more recently bothering her.  She also has a bit of epigastric discomfort.  She is concerned that she may be dehydrated.  She had a area in her left calf that she said was began to hurt again where there have been an abscess discovered at clinic in [place] to read the note from general surgery at [facility].  Apparently the area there was she was thought to have an abscess was not revealed on ultrasound of the  lower extremity and not found to be present when seen by general surgery.  She says that time she has temperatures to go up 202 F.      Past Medical History:  Diagnosis Date  . Complication of anesthesia    " i TAKE A LIITLE BIT LONGER TO [place] UP "  . DVT (deep venous thrombosis) ([place])    x2  . Gastroparesis 08/07/2019  . 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 Herpes labialis 08/12/2020  . Intertrigo 06/22/2020  . Lymphedema   . RA (rheumatoid arthritis) ([place])   . Rheumatoid arthritis ([place]) 08/12/2020  . Transaminitis 08/12/2020  . Uterine cancer [facility])     Past Surgical History:  Procedure Laterality Date  . APPENDECTOMY     . CHOLECYSTECTOMY    . ESOPHAGOGASTRODUODENOSCOPY N/A 04/30/2017   Procedure: ESOPHAGOGASTRODUODENOSCOPY (EGD);  Surgeon: Teena Irani, MD;  Location: [facility] ENDOSCOPY;  Service: Endoscopy;  Laterality: N/A;  . HERNIA REPAIR     x2  . KNEE SURGERY     x3  . MINIMALLY INVASIVE FORAMINOTOMY CERVICAL SPINE     C6-T1, Nitka  . SAVORY DILATION N/A 04/30/2017   Procedure: SAVORY DILATION;  Surgeon: Teena Irani, MD;  Location: [facility] ENDOSCOPY;  Service: Endoscopy;  Laterality: N/A;  . TONSILLECTOMY    . TOTAL ABDOMINAL HYSTERECTOMY     Sarcoma, s/p XRT    Family History  Problem Relation Age of Onset  . Hypertension Other   . Hyperlipidemia Other   . Colon cancer Other        grandmother  . Hashimoto's thyroiditis Mother   . Eczema Mother   . Angioedema Mother   . Throat cancer Father   . Arrhythmia Father   . Angioedema Maternal Grandfather       Social History   Socioeconomic History  . Marital status: Married    Spouse name: Not on file  . Number of children: Not on file  . Years of education: Not on file  . Highest education level: Not on file  Occupational History  . Not on file  Tobacco Use  . Smoking status: Passive Smoke Exposure - Never Smoker  . Smokeless tobacco: Never Used  Vaping Use  . Vaping Use: Never used  Substance and Sexual Activity  . Alcohol use: Not Currently  . Drug use: No  . Sexual activity: Yes    Partners: Male    Birth control/protection: None  Other Topics Concern  . Not on file  Social History Narrative  . Not on file   Social Determinants of Health   Financial Resource Strain:   . Difficulty of Paying Living Expenses: Not on file  Food Insecurity:   . Worried About Charity fundraiser in the Last Year: Not on file  . Ran Out of Food in the Last Year: Not on file  Transportation Needs:   . Lack of Transportation (Medical): Not on file  . Lack of Transportation (Non-Medical): Not on file  Physical Activity:   . Days of Exercise per  Week: Not on file  . Minutes of Exercise per Session: Not on file  Stress:   . Feeling of Stress : Not on file  Social Connections:   . Frequency of Communication with Friends and Family: Not on file  . Frequency of Social Gatherings with Friends and Family: Not on file  . Attends Religious Services: Not on file  . Active Member of Clubs or Organizations: Not on file  . Attends Archivist  Meetings: Not on file  . Marital Status: Not on file    Allergies  Allergen Reactions  . Other     Dermabond surgical glue.   . Sulfa Antibiotics Anaphylaxis, Shortness Of Breath, Swelling and Rash    Angioedema (also)  . Sulfonamide Derivatives Hives, Shortness Of Breath and Swelling    TONGUE SWELLS  . Benadryl [Diphenhydramine Hcl] Other (See Comments)    Hyperactivity      Current Outpatient Medications:  .  acyclovir (ZOVIRAX) 400 MG tablet, Take 400 mg by mouth 3 (three) times daily. , Disp: , Rfl:  .  amphetamine-dextroamphetamine (ADDERALL) 20 MG tablet, Take 20 mg by mouth 3 (three) times daily., Disp: , Rfl:  .  ANTACID 200-200-20 MG/5ML suspension, Take by mouth., Disp: , Rfl:  .  atropine 1 % ophthalmic solution, Place 1 drop into the left eye daily as needed (for dry eye)., Disp: , Rfl:  .  baclofen (LIORESAL) 10 MG tablet, Take 10 mg by mouth daily., Disp: , Rfl:  .  Bepotastine Besilate (BEPREVE) 1.5 % SOLN, Place 1 drop into both eyes 2 (two) times a day., Disp: , Rfl:  .  cycloSPORINE (RESTASIS) 0.05 % ophthalmic emulsion, Place 1 drop into both eyes 2 (two) times daily., Disp: , Rfl:  .  dicyclomine (BENTYL) 10 MG capsule, Take 10 mg by mouth daily as needed for nausea/vomiting., Disp: , Rfl:  .  fluconazole (DIFLUCAN) 200 MG tablet, Take 1 tablet (200 mg total) by mouth daily., Disp: 30 tablet, Rfl: 1 .  folic acid (FOLVITE) 1 MG tablet, Take 2 tablets (2 mg total) by mouth daily., Disp: 180 tablet, Rfl: 3 .  furosemide (LASIX) 20 MG tablet, Take 20 mg by mouth  daily as needed. , Disp: , Rfl:  .  hyoscyamine (LEVSIN, ANASPAZ) 0.125 MG tablet, Take 0.125 mg by mouth every 4 (four) hours as needed for cramping. , Disp: , Rfl:  .  LORazepam (ATIVAN) 1 MG tablet, Take 1 mg by mouth 3 (three) times daily as needed., Disp: , Rfl:  .  methotrexate 50 MG/2ML injection, Inject 0.14mL into the skin once weekly., Disp: 10 mL, Rfl: 0 .  metoCLOPramide (REGLAN) 10 MG tablet, Take 10 mg by mouth daily. , Disp: , Rfl:  .  metoprolol succinate (TOPROL-XL) 25 MG 24 hr tablet, Take 25 mg by mouth daily., Disp: , Rfl:  .  nystatin (MYCOSTATIN) 100000 UNIT/ML suspension, Take 10 mLs by mouth 3 (three) times daily. , Disp: , Rfl:  .  ondansetron (ZOFRAN-ODT) 4 MG disintegrating tablet, Take 1 tablet (4 mg total) by mouth every 8 (eight) hours as needed for nausea or vomiting., Disp: 15 tablet, Rfl: 0 .  oxyCODONE-acetaminophen (PERCOCET) 7.5-325 MG tablet, , Disp: , Rfl:  .  pantoprazole (PROTONIX) 40 MG tablet, Take 1 tablet (40 mg total) by mouth 2 (two) times daily., Disp: 60 tablet, Rfl: 1 .  Polyethyl Glycol-Propyl Glycol (SYSTANE) 0.4-0.3 % GEL ophthalmic gel, Place 1 application into both eyes daily as needed (dryness)., Disp: , Rfl:  .  potassium chloride (K-DUR) 10 MEQ tablet, Take 10 mEq by mouth daily as needed. , Disp: , Rfl:  .  pravastatin (PRAVACHOL) 40 MG tablet, Take 40 mg by mouth daily., Disp: , Rfl:  .  prednisoLONE acetate (PRED FORTE) 1 % ophthalmic suspension, Place 1 drop into the left eye 2 (two) times daily., Disp: , Rfl:  .  promethazine (PHENERGAN) 25 MG suppository, Place 1 suppository (25 mg total) rectally  every 6 (six) hours as needed for nausea or vomiting., Disp: 12 each, Rfl: 0 .  SUMAtriptan (IMITREX) 25 MG tablet, Take 25 mg by mouth every 2 (two) hours as needed for migraine. May repeat in 2 hours if headache persists or recurs., Disp: , Rfl:  .  topiramate (TOPAMAX) 100 MG tablet, Take 100 mg by mouth daily., Disp: , Rfl:  .  topiramate  (TOPAMAX) 25 MG tablet, Take 25 mg by mouth at bedtime. , Disp: , Rfl:  .  traZODone (DESYREL) 150 MG tablet, Take 150 mg by mouth at bedtime as needed for sleep., Disp: , Rfl:  .  triamcinolone lotion (KENALOG) 0.1 %, APPLY TOPICALLY TO SCALP AT BEDTIME AS DIRECTED, Disp: , Rfl:  .  TUBERCULIN SYR 1CC/27GX1/2" (B-D TB SYRINGE 1CC/27GX1/2") 27G X 1/2" 1 ML MISC, 12 Syringes by Does not apply route once a week., Disp: 12 each, Rfl: 3 .  valACYclovir (VALTREX) 1000 MG tablet, Take 1 tablet (1,000 mg total) by mouth daily., Disp: 30 tablet, Rfl: 5     Review of Systems  Constitutional: Positive for fatigue and fever. Negative for activity change, appetite change, chills, diaphoresis and unexpected weight change.  HENT: Positive for sore throat. Negative for congestion, rhinorrhea, sinus pressure, sneezing and trouble swallowing.   Eyes: Negative for photophobia and visual disturbance.  Respiratory: Negative for cough, chest tightness, wheezing and stridor.   Cardiovascular: Negative for chest pain and palpitations.  Gastrointestinal: Negative for abdominal distention, abdominal pain, anal bleeding, blood in stool, constipation, diarrhea, nausea and vomiting.  Genitourinary: Negative for difficulty urinating, dysuria, flank pain and hematuria.  Musculoskeletal: Positive for myalgias. Negative for arthralgias, back pain, gait problem and joint swelling.  Skin: Negative for color change, pallor and wound.  Neurological: Negative for dizziness, tremors, weakness and light-headedness.  Hematological: Negative for adenopathy. Does not bruise/bleed easily.  Psychiatric/Behavioral: Negative for agitation, behavioral problems, confusion, decreased concentration, dysphoric mood and sleep disturbance.       Objective:   Physical Exam Constitutional:      General: She is not in acute distress.    Appearance: Normal appearance. She is well-developed. She is not ill-appearing or diaphoretic.  HENT:      Head: Normocephalic and atraumatic.     Right Ear: Hearing and external ear normal.     Left Ear: Hearing and external ear normal.     Nose: No nasal deformity or rhinorrhea.     Mouth/Throat:     Mouth: Mucous membranes are moist.     Pharynx: No posterior oropharyngeal erythema.   Eyes:     General: No scleral icterus.    Extraocular Movements: Extraocular movements intact.     Conjunctiva/sclera: Conjunctivae normal.     Right eye: Right conjunctiva is not injected.     Left eye: Left conjunctiva is not injected.  Neck:     Vascular: No JVD.  Cardiovascular:     Rate and Rhythm: Normal rate and regular rhythm.     Heart sounds: S1 normal and S2 normal. No murmur heard.  No gallop.   Pulmonary:     Effort: Pulmonary effort is normal. No respiratory distress.     Breath sounds: No stridor. No wheezing or rhonchi.  Abdominal:     General: Abdomen is flat. There is no distension.     Palpations: Abdomen is soft. There is no mass.     Tenderness: There is abdominal tenderness in the left upper quadrant.  Musculoskeletal:  General: Normal range of motion.     Right shoulder: Normal.     Left shoulder: Normal.     Cervical back: Normal range of motion and neck supple.     Right hip: Normal.     Left hip: Normal.     Right knee: Normal.     Left knee: Normal.  Lymphadenopathy:     Head:     Right side of head: No submandibular, preauricular or posterior auricular adenopathy.     Left side of head: No submandibular, preauricular or posterior auricular adenopathy.     Cervical: No cervical adenopathy.     Right cervical: No superficial or deep cervical adenopathy.    Left cervical: No superficial or deep cervical adenopathy.  Skin:    General: Skin is warm and dry.     Coloration: Skin is not pale.     Findings: No abrasion, bruising, ecchymosis, erythema, lesion or rash.     Nails: There is no clubbing.  Neurological:     General: No focal deficit present.     Mental  Status: She is alert and oriented to person, place, and time. Mental status is at baseline.     Sensory: No sensory deficit.     Coordination: Coordination normal.     Gait: Gait normal.  Psychiatric:        Attention and Perception: Attention and perception normal. She is attentive.        Mood and Affect: Mood is anxious.        Speech: Speech normal.        Behavior: Behavior normal. Behavior is cooperative.        Thought Content: Thought content normal.        Cognition and Memory: Cognition and memory normal.        Judgment: Judgment normal.    Op lesions from photograph she showed me several visits ago.    Intertrigo at last visit      Tongue today  August 12, 2020:    Lips when seen by Dr. 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 I would take her off fluconazole again and see her back in a week.  I may refer her back to ENT for their opinion and consideration of a biopsy my understanding of geographic tongue is purely symptomatic treatment and may be if we can restrain ourselves and simply treat her symptomatically we can get out of this vicious cycle of assuming that she has thrush and that fluconazole is making a difference.  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  For example she could get CT scan performed again last when I see done in epic is in August 2020.  She has had studies done at [facility] in [place] as well but not more recently than that.    Immunodeficiency: I am also concerned that giving her IVIG replacement is not making a difference in her quality of life.  We will consider stopping this in the future.  Leukopenia: Her white count was low and ANC low recently, will repeat CBC w differential. 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 She remains a very strange case  She is being followed by  immunology as well as rheumatology  Rheumatology patient currently on methotrexate.

## 2020-11-09 NOTE — Patient Instructions (Addendum)
Stop the fluconazole  RTC to see me next Wednesday  I think you more likely have something known as "geographic tongue"  We could see if a ENT MD can biopsy to help with understanding this

## 2020-11-10 LAB — COMPLETE METABOLIC PANEL WITH GFR
AG Ratio: 1.4 (calc) (ref 1.0–2.5)
ALT: 8 U/L (ref 6–29)
AST: 20 U/L (ref 10–35)
Albumin: 4.2 g/dL (ref 3.6–5.1)
Alkaline phosphatase (APISO): 68 U/L (ref 31–125)
BUN: 9 mg/dL (ref 7–25)
CO2: 21 mmol/L (ref 20–32)
Calcium: 9.2 mg/dL (ref 8.6–10.2)
Chloride: 112 mmol/L — ABNORMAL HIGH (ref 98–110)
Creat: 0.83 mg/dL (ref 0.50–1.10)
GFR, Est African American: 98 mL/min/{1.73_m2} (ref >=60)
GFR, Est Non African American: 85 mL/min/{1.73_m2} (ref >=60)
Globulin: 3.1 g/dL (calc) (ref 1.9–3.7)
Glucose, Bld: 104 mg/dL — ABNORMAL HIGH (ref 65–99)
Potassium: 3.6 mmol/L (ref 3.5–5.3)
Sodium: 142 mmol/L (ref 135–146)
Total Bilirubin: 0.6 mg/dL (ref 0.2–1.2)
Total Protein: 7.3 g/dL (ref 6.1–8.1)

## 2020-11-10 LAB — CBC WITH DIFFERENTIAL/PLATELET
Absolute Monocytes: 291 cells/uL (ref 200–950)
Basophils Absolute: 21 cells/uL (ref 0–200)
Basophils Relative: 0.4 %
Eosinophils Absolute: 10 cells/uL — ABNORMAL LOW (ref 15–500)
Eosinophils Relative: 0.2 %
HCT: 39.2 % (ref 35.0–45.0)
Hemoglobin: 13 g/dL (ref 11.7–15.5)
Lymphs Abs: 1487 cells/uL (ref 850–3900)
MCH: 32.7 pg (ref 27.0–33.0)
MCHC: 33.2 g/dL (ref 32.0–36.0)
MCV: 98.5 fL (ref 80.0–100.0)
MPV: 10.6 fL (ref 7.5–12.5)
Monocytes Relative: 5.6 %
Neutro Abs: 3390 cells/uL (ref 1500–7800)
Neutrophils Relative %: 65.2 %
Platelets: 274 10*3/uL (ref 140–400)
RBC: 3.98 10*6/uL (ref 3.80–5.10)
RDW: 13 % (ref 11.0–15.0)
Total Lymphocyte: 28.6 %
WBC: 5.2 10*3/uL (ref 3.8–10.8)

## 2020-11-18 ENCOUNTER — Other Ambulatory Visit: Payer: Self-pay

## 2020-11-18 ENCOUNTER — Encounter: Payer: Self-pay | Admitting: Infectious Disease

## 2020-11-18 ENCOUNTER — Ambulatory Visit (INDEPENDENT_AMBULATORY_CARE_PROVIDER_SITE_OTHER): Payer: Managed Care, Other (non HMO) | Admitting: Infectious Disease

## 2020-11-18 VITALS — BP 103/69 | HR 67 | Temp 98.3°F

## 2020-11-18 DIAGNOSIS — K121 Other forms of stomatitis: Secondary | ICD-10-CM | POA: Diagnosis not present

## 2020-11-18 DIAGNOSIS — D808 Other immunodeficiencies with predominantly antibody defects: Secondary | ICD-10-CM

## 2020-11-18 DIAGNOSIS — D708 Other neutropenia: Secondary | ICD-10-CM

## 2020-11-18 DIAGNOSIS — B370 Candidal stomatitis: Secondary | ICD-10-CM

## 2020-11-18 DIAGNOSIS — K200 Eosinophilic esophagitis: Secondary | ICD-10-CM

## 2020-11-18 DIAGNOSIS — K141 Geographic tongue: Secondary | ICD-10-CM

## 2020-11-18 DIAGNOSIS — M25571 Pain in right ankle and joints of right foot: Secondary | ICD-10-CM

## 2020-11-18 DIAGNOSIS — M05712 Rheumatoid arthritis with rheumatoid factor of left shoulder without organ or systems involvement: Secondary | ICD-10-CM

## 2020-11-18 NOTE — Progress Notes (Signed)
Subjective:   Chief complaint: Complaints yet again of problem in her left leg as well as some soreness in the interior of her mouth on the buccal mucosa  Patient ID: [name] Horn, female    DOB: January 22, 1976, 44 y.o.   MRN: [id number]  HPI   44 year old Caucasian lady with hx of seropositive RA with keratoconjunctivitis sicca, and osteoarthritis.who  Started on MTX in November of  2020. She also  Has hx of eosinophilic esophagitis and was on fluticasone. She has had history of C difficile with 3 episodes. She  Had problems with recurrent strep throat and had tonsillectomy, pneumonia a few times, ear infection, tooth abscess. She  Has had  Shingles outbreak already. She then began to have thrush in January  which was attributed to her  Oral corticosteroid therapy but  Has persisted despite this being stopped.   She was taking nystatin swish and  Swallow and previously twice weekly fluconazole at 200 mg  While her  Overall immunoglobulin levels were normal she did not mount immune response to vaccination with pneumovax and prevnar with antibodies checked > 4 months after this that were low.  She was bitten by a cat and recently had tetanus vaccine in ER and antibiotics.  After 2 COVID vaccines she had not much of a response in terms of flu like  Symptoms etc that are typically experienced by patients with 2 dose mRNA based vaccines.   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  During one visit several visits ago she appeared to be suffering from intertrigo.  I increased her fluconazole 200 mg daily prophylactic dose and a higher dose  She is continued to have abnormalities on her tongue but it is not clear to me that this is really thrush but could be something else such as geographic tongue for example.  I ended up taking her off of the fluconazole with desire to see how she did off of  it.  She ended up developing stomatitis but missed her appointment with Dr. Megan Salon was late again and then finally did see him he did a swab for fungal culture which is growing copious Candida albicans   Sensitivity data on her candida showed:  organism CANDIDA ALBICANS   Antimicrobial 1 FLUCONAZOLE   MIC 0.500 S   Antimicrobial 2 VORICONAZOLE   MIC <=0.008 S   Antimicrobial 3 POSACONAZOLE   MIC 0.030   Antimicrobial 4 CASPOFUNGIN   MIC 0.015 S   Comment: .    He is remained on fluconazole at high dose since then and is seen Terri Piedra in the interim.  Her tongue lesions have not resolved though she was telling me that her tongue was "completely pink last week.  Again I feel that her story fits much better with geographic tongue then with candidiasis.  I wanted to observe her again off of antifungal therapy.  She had a area in her left calf that she said was began to hurt again where there have been an abscess discovered at clinic in [place] to read the note from general surgery at [facility].  Apparently the area there was she was thought to have an abscess was not revealed on ultrasound of the lower extremity and not found to be present when seen by general surgery.  Since taking her off of the fluconazole last week her tongue  actually looks improved.  She does say she is having some soreness in the entire of her mouth.  I really do not think this has anything to do with Candida though.  She is again complaining of some soreness in her left calf and shows me reports done at [facility] regarding a cystic area that they were concerned could be an abscess.  She is apparently been started on doxycycline for this.            Past Medical History:  Diagnosis Date  . Complication of anesthesia    " i TAKE A LIITLE BIT LONGER TO [place] UP "  . DVT (deep venous thrombosis) ([place])    x2  . Gastroparesis 08/07/2019  . 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  Herpes labialis 08/12/2020  . Intertrigo 06/22/2020  . Lymphedema   . RA (rheumatoid arthritis) ([place])   . Rheumatoid arthritis ([place]) 08/12/2020  . Transaminitis 08/12/2020  . Uterine cancer [facility])     Past Surgical History:  Procedure Laterality Date  . APPENDECTOMY    . CHOLECYSTECTOMY    . ESOPHAGOGASTRODUODENOSCOPY N/A 04/30/2017   Procedure: ESOPHAGOGASTRODUODENOSCOPY (EGD);  Surgeon: Teena Irani, MD;  Location: [facility] ENDOSCOPY;  Service: Endoscopy;  Laterality: N/A;  . HERNIA REPAIR     x2  . KNEE SURGERY     x3  . MINIMALLY INVASIVE FORAMINOTOMY CERVICAL SPINE     C6-T1, N[place]ka  . SAVORY DILATION N/A 04/30/2017   Procedure: SAVORY DILATION;  Surgeon: Teena Irani, MD;  Location: [facility] ENDOSCOPY;  Service: Endoscopy;  Laterality: N/A;  . TONSILLECTOMY    . TOTAL ABDOMINAL HYSTERECTOMY     Sarcoma, s/p XRT    Family History  Problem Relation Age of Onset  . Hypertension Other   . Hyperlipidemia Other   . Colon cancer Other        grandmother  . Hashimoto's thyroiditis Mother   . Eczema Mother   . Angioedema Mother   . Throat cancer Father   . Arrhythmia Father   . Angioedema Maternal Grandfather       Social History   Socioeconomic History  . Marital status: Married    Spouse name: Not on file  . Number of children: Not on file  . Years of education: Not on file  . Highest education level: Not on file  Occupational History  . Not on file  Tobacco Use  . Smoking status: Passive Smoke Exposure - Never Smoker  . Smokeless tobacco: Never Used  Vaping Use  . Vaping Use: Never used  Substance and Sexual Activity  . Alcohol use: Not Currently  . Drug use: No  . Sexual activity: Yes    Partners: Male    Birth control/protection: None  Other Topics Concern  . Not on file  Social History Narrative  . Not on file   Social Determinants of Health   Financial Resource Strain:   . Difficulty of Paying Living Expenses: Not on file  Food Insecurity:   . Worried About  Charity fundraiser in the Last Year: Not on file  . Ran Out of Food in the Last Year: Not on file  Transportation Needs:   . Lack of Transportation (Medical): Not on file  . Lack of Transportation (Non-Medical): Not on file  Physical Activity:   . Days of Exercise per Week: Not on file  . Minutes of Exercise per Session: Not on file  Stress:   . Feeling of Stress : Not on file  Social Connections:   . Frequency of Communication with Friends and Family: Not on file  . Frequency of Social Gatherings with Friends and Family: Not on file  . Attends Religious Services: Not on file  . Active Member of Clubs or Organizations: Not on file  . Attends Archivist Meetings: Not on file  . Marital Status: Not on file    Allergies  Allergen Reactions  . Other     Dermabond surgical glue.   . Sulfa Antibiotics Anaphylaxis, Shortness Of Breath, Swelling and Rash    Angioedema (also)  . Sulfonamide Derivatives Hives, Shortness Of Breath and Swelling    TONGUE SWELLS  . Benadryl [Diphenhydramine Hcl] Other (See Comments)    Hyperactivity      Current Outpatient Medications:  .  acyclovir (ZOVIRAX) 400 MG tablet, Take 400 mg by mouth 3 (three) times daily. , Disp: , Rfl:  .  ANTACID 200-200-20 MG/5ML suspension, Take by mouth., Disp: , Rfl:  .  atropine 1 % ophthalmic solution, Place 1 drop into the left eye daily as needed (for dry eye)., Disp: , Rfl:  .  baclofen (LIORESAL) 10 MG tablet, Take 10 mg by mouth daily., Disp: , Rfl:  .  Bepotastine Besilate (BEPREVE) 1.5 % SOLN, Place 1 drop into both eyes 2 (two) times a day., Disp: , Rfl:  .  cycloSPORINE (RESTASIS) 0.05 % ophthalmic emulsion, Place 1 drop into both eyes 2 (two) times daily., Disp: , Rfl:  .  dicyclomine (BENTYL) 10 MG capsule, Take 10 mg by mouth daily as needed for nausea/vomiting., Disp: , Rfl:  .  fluconazole (DIFLUCAN) 200 MG tablet, Take 1 tablet (200 mg total) by mouth daily., Disp: 30 tablet, Rfl: 1 .  folic  acid (FOLVITE) 1 MG tablet, Take 2 tablets (2 mg total) by mouth daily., Disp: 180 tablet, Rfl: 3 .  furosemide (LASIX) 20 MG tablet, Take 20 mg by mouth daily as needed. , Disp: , Rfl:  .  hyoscyamine (LEVSIN, ANASPAZ) 0.125 MG tablet, Take 0.125 mg by mouth every 4 (four) hours as needed for cramping. , Disp: , Rfl:  .  LORazepam (ATIVAN) 1 MG tablet, Take 1 mg by mouth 3 (three) times daily as needed., Disp: , Rfl:  .  methotrexate 50 MG/2ML injection, Inject 0.24mL into the skin once weekly., Disp: 10 mL, Rfl: 0 .  metoCLOPramide (REGLAN) 10 MG tablet, Take 10 mg by mouth daily. , Disp: , Rfl:  .  metoprolol succinate (TOPROL-XL) 25 MG 24 hr tablet, Take 25 mg by mouth daily., Disp: , Rfl:  .  nystatin (MYCOSTATIN) 100000 UNIT/ML suspension, Take 10 mLs by mouth 3 (three) times daily. , Disp: , Rfl:  .  ondansetron (ZOFRAN-ODT) 4 MG disintegrating tablet, Take 1 tablet (4 mg total) by mouth every 8 (eight) hours as needed for nausea or vomiting., Disp: 15 tablet, Rfl: 0 .  oxyCODONE-acetaminophen (PERCOCET) 7.5-325 MG tablet, , Disp: , Rfl:  .  pantoprazole (PROTONIX) 40 MG tablet, Take 1 tablet (40 mg total) by mouth 2 (two) times daily., Disp: 60 tablet, Rfl: 1 .  Polyethyl Glycol-Propyl Glycol (SYSTANE) 0.4-0.3 % GEL ophthalmic gel, Place 1 application into both eyes daily as needed (dryness)., Disp: , Rfl:  .  potassium chloride (K-DUR) 10 MEQ tablet, Take 10 mEq by mouth daily as needed. , Disp: , Rfl:  .  pravastatin (PRAVACHOL) 40 MG tablet, Take 40 mg by mouth daily., Disp: , Rfl:  .  prednisoLONE acetate (PRED FORTE)  1 % ophthalmic suspension, Place 1 drop into the left eye 2 (two) times daily., Disp: , Rfl:  .  promethazine (PHENERGAN) 25 MG suppository, Place 1 suppository (25 mg total) rectally every 6 (six) hours as needed for nausea or vomiting., Disp: 12 each, Rfl: 0 .  SUMAtriptan (IMITREX) 25 MG tablet, Take 25 mg by mouth every 2 (two) hours as needed for migraine. May repeat in  2 hours if headache persists or recurs., Disp: , Rfl:  .  topiramate (TOPAMAX) 100 MG tablet, Take 100 mg by mouth daily., Disp: , Rfl:  .  topiramate (TOPAMAX) 25 MG tablet, Take 25 mg by mouth at bedtime. , Disp: , Rfl:  .  traZODone (DESYREL) 150 MG tablet, Take 150 mg by mouth at bedtime as needed for sleep., Disp: , Rfl:  .  triamcinolone lotion (KENALOG) 0.1 %, APPLY TOPICALLY TO SCALP AT BEDTIME AS DIRECTED, Disp: , Rfl:  .  TUBERCULIN SYR 1CC/27GX1/2" (B-D TB SYRINGE 1CC/27GX1/2") 27G X 1/2" 1 ML MISC, 12 Syringes by Does not apply route once a week., Disp: 12 each, Rfl: 3 .  valACYclovir (VALTREX) 1000 MG tablet, Take 1 tablet (1,000 mg total) by mouth daily., Disp: 30 tablet, Rfl: 5 .  amphetamine-dextroamphetamine (ADDERALL) 20 MG tablet, Take 20 mg by mouth 3 (three) times daily. (Patient not taking: Reported on 11/09/2020), Disp: , Rfl:      Review of Systems  Constitutional: Positive for fatigue and fever. Negative for activity change, appetite change, chills, diaphoresis and unexpected weight change.  HENT: Positive for sore throat. Negative for congestion, rhinorrhea, sinus pressure, sneezing and trouble swallowing.   Eyes: Negative for photophobia and visual disturbance.  Respiratory: Negative for cough, chest tightness, wheezing and stridor.   Cardiovascular: Negative for chest pain and palpitations.  Gastrointestinal: Negative for abdominal distention, abdominal pain, anal bleeding, blood in stool, constipation, diarrhea, nausea and vomiting.  Genitourinary: Negative for difficulty urinating, dysuria, flank pain and hematuria.  Musculoskeletal: Positive for myalgias. Negative for arthralgias, back pain, gait problem and joint swelling.  Skin: Negative for color change, pallor and wound.  Neurological: Negative for dizziness, tremors, weakness and light-headedness.  Hematological: Negative for adenopathy. Does not bruise/bleed easily.  Psychiatric/Behavioral: Negative for  agitation, behavioral problems, confusion, decreased concentration, dysphoric mood and sleep disturbance.       Objective:   Physical Exam Constitutional:      General: She is not in acute distress.    Appearance: Normal appearance. She is well-developed. She is not ill-appearing or diaphoretic.  HENT:     Head: Normocephalic and atraumatic.     Right Ear: Hearing and external ear normal.     Left Ear: Hearing and external ear normal.     Nose: No nasal deformity or rhinorrhea.     Mouth/Throat:     Mouth: Mucous membranes are moist.     Pharynx: No posterior oropharyngeal erythema.  Eyes:     General: No scleral icterus.    Extraocular Movements: Extraocular movements intact.     Conjunctiva/sclera: Conjunctivae normal.     Right eye: Right conjunctiva is not injected.     Left eye: Left conjunctiva is not injected.  Neck:     Vascular: No JVD.  Cardiovascular:     Rate and Rhythm: Normal rate and regular rhythm.     Heart sounds: S1 normal and S2 normal. No murmur heard.  No gallop.   Pulmonary:     Effort: Pulmonary effort is normal. No respiratory distress.  Breath sounds: No stridor. No wheezing or rhonchi.  Abdominal:     General: Abdomen is flat. There is no distension.     Palpations: Abdomen is soft. There is no mass.     Tenderness: There is abdominal tenderness in the left upper quadrant.  Musculoskeletal:        General: Normal range of motion.     Right shoulder: Normal.     Left shoulder: Normal.     Cervical back: Normal range of motion and neck supple.     Right hip: Normal.     Left hip: Normal.     Right knee: Normal.     Left knee: Normal.  Lymphadenopathy:     Head:     Right side of head: No submandibular, preauricular or posterior auricular adenopathy.     Left side of head: No submandibular, preauricular or posterior auricular adenopathy.     Cervical: No cervical adenopathy.     Right cervical: No superficial or deep cervical adenopathy.     Left cervical: No superficial or deep cervical adenopathy.  Skin:    General: Skin is warm and dry.     Coloration: Skin is not pale.     Findings: No abrasion, bruising, ecchymosis, erythema, lesion or rash.     Nails: There is no clubbing.  Neurological:     General: No focal deficit present.     Mental Status: She is alert and oriented to person, place, and time. Mental status is at baseline.     Sensory: No sensory deficit.     Coordination: Coordination normal.     Gait: Gait normal.  Psychiatric:        Attention and Perception: Attention and perception normal. She is attentive.        Mood and Affect: Mood is anxious.        Speech: Speech normal.        Behavior: Behavior normal. Behavior is cooperative.        Thought Content: Thought content normal.        Cognition and Memory: Cognition and memory normal.        Judgment: Judgment normal.    Op lesions from photograph she showed me several visits ago.    ]  Tongue  August 12, 2020:    Lips when seen by Dr. 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  I would like to continue observe her off of [place] therapy.  I encouraged her to use viscous lidocaine to help with pain control and I am putting in a referral for her to straight see ear nose and throat again.  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   ARea that she  isconcerned about being potentially infected in her calf: On exam there really is not much here that I  can appreciate in terms of evidence of an abscess.  She is apparently on some doxycycline at present

## 2020-11-25 ENCOUNTER — Other Ambulatory Visit (HOSPITAL_COMMUNITY): Payer: Self-pay | Admitting: *Deleted

## 2020-11-25 ENCOUNTER — Encounter (HOSPITAL_COMMUNITY): Payer: Managed Care, Other (non HMO)

## 2020-11-26 ENCOUNTER — Telehealth: Payer: Self-pay

## 2020-11-26 ENCOUNTER — Other Ambulatory Visit: Payer: Self-pay

## 2020-11-26 ENCOUNTER — Ambulatory Visit (HOSPITAL_COMMUNITY)
Admission: RE | Admit: 2020-11-26 | Discharge: 2020-11-26 | Disposition: A | Discharge status: Home / Self Care | Payer: Managed Care, Other (non HMO) | Source: Ambulatory Visit | Attending: Infectious Disease | Admitting: Infectious Disease

## 2020-11-26 DIAGNOSIS — D808 Other immunodeficiencies with predominantly antibody defects: Secondary | ICD-10-CM | POA: Insufficient documentation

## 2020-11-26 MED ORDER — IMMUNE GLOBULIN (HUMAN) 10 GM/100ML IV SOLN
30.0000 g | INTRAVENOUS | Status: DC
  Administered 2020-11-26: 30 g via INTRAVENOUS
  Filled 2020-11-26: qty 200

## 2020-11-26 NOTE — Progress Notes (Signed)
Pt questioned receiving IV fluid bolus today, pt instructed we only had orders to infuse IVIG today, per note 11/4 IV fluid bolus was a one time order.  She was instructed to talk with her referring md.

## 2020-11-26 NOTE — Telephone Encounter (Signed)
Patient is calling from the parking lot of the infusion center "totally confused" as to why she was not given the extra bolus of IV fluids that was discussed with provider.  She states the infusion nurse said it was a one time only dose for the extra and none was given.   I verified the order in the medical records which states she was to receive the extra IV fluids once only.  Patient was informed of the order.  She seemed to be surprised but accepted this information.   Laverle Patter, RN

## 2020-12-03 ENCOUNTER — Telehealth: Payer: Self-pay

## 2020-12-03 NOTE — Telephone Encounter (Signed)
Patient reports she has been trying to get into ENT office for biosopies as Dr. Tommy Medal recommended. Patient has a tentative appointment in mid January, but she is trying to get one done sooner. She has also called [facility], but no sooner appointment available. Ask if Dr. Tommy Medal has any further recommendations.  Routing to provider for advise. [name] Horn

## 2020-12-03 NOTE — Telephone Encounter (Signed)
That is probably the best that can be done, perhaps they have a waiting list. I am sorry I did not suggest getting back to them sooner than I did. I put referral in to them in Epic. Margaret any thoughts? I can talk to Dr. Lucia Gaskins

## 2020-12-04 NOTE — Telephone Encounter (Signed)
Called Dr Pollie Friar office spoke with Gay Filler she advised patient has not called them to schedule but that they can see patient the week of December 20   I will call her and give her their phone # to call.

## 2020-12-04 NOTE — Telephone Encounter (Signed)
Excellent

## 2020-12-07 ENCOUNTER — Other Ambulatory Visit: Payer: Self-pay

## 2020-12-07 ENCOUNTER — Ambulatory Visit (INDEPENDENT_AMBULATORY_CARE_PROVIDER_SITE_OTHER): Payer: Managed Care, Other (non HMO) | Admitting: Otolaryngology

## 2020-12-07 ENCOUNTER — Other Ambulatory Visit (INDEPENDENT_AMBULATORY_CARE_PROVIDER_SITE_OTHER): Payer: Self-pay

## 2020-12-07 ENCOUNTER — Other Ambulatory Visit (HOSPITAL_COMMUNITY)
Admission: RE | Admit: 2020-12-07 | Discharge: 2020-12-07 | Disposition: A | Discharge status: Home / Self Care | Payer: Managed Care, Other (non HMO) | Source: Ambulatory Visit | Attending: Otolaryngology | Admitting: Otolaryngology

## 2020-12-07 ENCOUNTER — Other Ambulatory Visit: Payer: Self-pay | Admitting: Infectious Disease

## 2020-12-07 ENCOUNTER — Encounter (INDEPENDENT_AMBULATORY_CARE_PROVIDER_SITE_OTHER): Payer: Self-pay | Admitting: Otolaryngology

## 2020-12-07 VITALS — Temp 97.0°F

## 2020-12-07 DIAGNOSIS — D3709 Neoplasm of uncertain behavior of other specified sites of the oral cavity: Secondary | ICD-10-CM

## 2020-12-07 DIAGNOSIS — D849 Immunodeficiency, unspecified: Secondary | ICD-10-CM | POA: Diagnosis not present

## 2020-12-07 DIAGNOSIS — Z8619 Personal history of other infectious and parasitic diseases: Secondary | ICD-10-CM

## 2020-12-07 MED ORDER — NYSTATIN 100000 UNIT/ML MT SUSP: 10.0000 mL | Freq: Three times a day (TID) | OROMUCOSAL | 2 refills | Status: AC

## 2020-12-07 NOTE — Telephone Encounter (Signed)
Prescription sent in  

## 2020-12-07 NOTE — Telephone Encounter (Signed)
Patient made aware and appreciative of Dr. Tommy Medal sending prescription in for her. [name] Horn

## 2020-12-07 NOTE — Telephone Encounter (Signed)
Patient reports she was seen by Dr. Lucia Gaskins today. Patient requesting Nystatin solution while she awaits pathology report. Routing to Dr. Tommy Medal for approval. Patient using [facility] on [address]. [name] Horn

## 2020-12-07 NOTE — Progress Notes (Signed)
HPI: [name] Horn is a 44 y.o. female who returns today for evaluation of chronic oral thrush.  She has a chronic immune deficiency and is followed by immunology.  She has been treated for oral thrush numerous times in the past and her immunologist recommended obtaining a biopsy the next time the thrush returns.  She has stopped her oral antifungal medication and presents to have biopsy performed.  Presently the thrush is worse on the buccal mucosa on both sides..  Past Medical History:  Diagnosis Date  . Complication of anesthesia    " i TAKE A LIITLE BIT LONGER TO [place] UP "  . DVT (deep venous thrombosis) ([place])    x2  . Gastroparesis 08/07/2019  . 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 Herpes labialis 08/12/2020  . Intertrigo 06/22/2020  . Lymphedema   . RA (rheumatoid arthritis) ([place])   . Rheumatoid arthritis ([place]) 08/12/2020  . Transaminitis 08/12/2020  . Uterine cancer [facility])    Past Surgical History:  Procedure Laterality Date  . APPENDECTOMY    . CHOLECYSTECTOMY    . ESOPHAGOGASTRODUODENOSCOPY N/A 04/30/2017   Procedure: ESOPHAGOGASTRODUODENOSCOPY (EGD);  Surgeon: Teena Irani, MD;  Location: [facility] ENDOSCOPY;  Service: Endoscopy;  Laterality: N/A;  . HERNIA REPAIR     x2  . KNEE SURGERY     x3  . MINIMALLY INVASIVE FORAMINOTOMY CERVICAL SPINE     C6-T1, Nitka  . SAVORY DILATION N/A 04/30/2017   Procedure: SAVORY DILATION;  Surgeon: Teena Irani, MD;  Location: [facility] ENDOSCOPY;  Service: Endoscopy;  Laterality: N/A;  . TONSILLECTOMY    . TOTAL ABDOMINAL HYSTERECTOMY     Sarcoma, s/p XRT   Social History   Socioeconomic History  . Marital status: Married    Spouse name: Not on file  . Number of children: Not on file  . Years of education: Not on file  . Highest education level: Not on file  Occupational History  . Not on file  Tobacco Use  . Smoking status: Passive Smoke Exposure - Never Smoker  . Smokeless tobacco: Never Used  Vaping Use  . Vaping Use: Never used  Substance and  Sexual Activity  . Alcohol use: Not Currently  . Drug use: No  . Sexual activity: Yes    Partners: Male    Birth control/protection: None  Other Topics Concern  . Not on file  Social History Narrative  . Not on file   Social Determinants of Health   Financial Resource Strain: Not on file  Food Insecurity: Not on file  Transportation Needs: Not on file  Physical Activity: Not on file  Stress: Not on file  Social Connections: Not on file   Family History  Problem Relation Age of Onset  . Hypertension Other   . Hyperlipidemia Other   . Colon cancer Other        grandmother  . Hashimoto's thyroiditis Mother   . Eczema Mother   . Angioedema Mother   . Throat cancer Father   . Arrhythmia Father   . Angioedema Maternal Grandfather    Allergies  Allergen Reactions  . Other     Dermabond surgical glue.   . Sulfa Antibiotics Anaphylaxis, Shortness Of Breath, Swelling and Rash    Angioedema (also)  . Sulfonamide Derivatives Hives, Shortness Of Breath and Swelling    TONGUE SWELLS  . Benadryl [Diphenhydramine Hcl] Other (See Comments)    Hyperactivity    Prior to Admission medications   Medication Sig Start Date End Date Taking?  Authorizing Provider  acyclovir (ZOVIRAX) 400 MG tablet Take 400 mg by mouth 3 (three) times daily.  03/03/20  Yes [provider]  amphetamine-dextroamphetamine (ADDERALL) 20 MG tablet Take 20 mg by mouth 3 (three) times daily.   Yes [provider]  ANTACID 200-200-20 MG/5ML suspension Take by mouth. 09/02/20  Yes [provider]  atropine 1 % ophthalmic solution Place 1 drop into the left eye daily as needed (for dry eye).   Yes [provider]  baclofen (LIORESAL) 10 MG tablet Take 10 mg by mouth daily. 02/28/20  Yes [provider]  Bepotastine Besilate 1.5 % SOLN Place 1 drop into both eyes 2 (two) times a day.   Yes [provider]  cycloSPORINE (RESTASIS) 0.05 % ophthalmic emulsion Place 1 drop  into both eyes 2 (two) times daily.   Yes [provider]  dicyclomine (BENTYL) 10 MG capsule Take 10 mg by mouth daily as needed for nausea/vomiting. 01/02/19  Yes [provider]  fluconazole (DIFLUCAN) 200 MG tablet Take 1 tablet (200 mg total) by mouth daily. 10/05/20  Yes Golden Circle, FNP  folic acid (FOLVITE) 1 MG tablet Take 2 tablets (2 mg total) by mouth daily. 10/30/19  Yes Deveshwar, Abel Presto, MD  furosemide (LASIX) 20 MG tablet Take 20 mg by mouth daily as needed.    Yes [provider]  hyoscyamine (LEVSIN, ANASPAZ) 0.125 MG tablet Take 0.125 mg by mouth every 4 (four) hours as needed for cramping.  07/25/17  Yes [provider]  LORazepam (ATIVAN) 1 MG tablet Take 1 mg by mouth 3 (three) times daily as needed. 03/08/20  Yes [provider]  methotrexate 50 MG/2ML injection Inject 0.6mL into the skin once weekly. 10/16/20  Yes Deveshwar, Abel Presto, MD  metoCLOPramide (REGLAN) 10 MG tablet Take 10 mg by mouth daily.    Yes [provider]  metoprolol succinate (TOPROL-XL) 25 MG 24 hr tablet Take 25 mg by mouth daily.   Yes [provider]  ondansetron (ZOFRAN-ODT) 4 MG disintegrating tablet Take 1 tablet (4 mg total) by mouth every 8 (eight) hours as needed for nausea or vomiting. 08/05/19  Yes Upstill, Shari, PA-C  oxyCODONE-acetaminophen (PERCOCET) 7.5-325 MG tablet  03/31/20  Yes [provider]  pantoprazole (PROTONIX) 40 MG tablet Take 1 tablet (40 mg total) by mouth 2 (two) times daily. 05/02/17 08/04/28 Yes Elgergawy, Silver Huguenin, MD  Polyethyl Glycol-Propyl Glycol (SYSTANE) 0.4-0.3 % GEL ophthalmic gel Place 1 application into both eyes daily as needed (dryness).   Yes [provider]  potassium chloride (K-DUR) 10 MEQ tablet Take 10 mEq by mouth daily as needed.    Yes [provider]  pravastatin (PRAVACHOL) 40 MG tablet Take 40 mg by mouth daily.   Yes [provider]  prednisoLONE acetate  (PRED FORTE) 1 % ophthalmic suspension Place 1 drop into the left eye 2 (two) times daily.   Yes [provider]  promethazine (PHENERGAN) 25 MG suppository Place 1 suppository (25 mg total) rectally every 6 (six) hours as needed for nausea or vomiting. 08/05/19  Yes Upstill, Shari, PA-C  SUMAtriptan (IMITREX) 25 MG tablet Take 25 mg by mouth every 2 (two) hours as needed for migraine. May repeat in 2 hours if headache persists or recurs.   Yes [provider]  topiramate (TOPAMAX) 100 MG tablet Take 100 mg by mouth daily. 06/18/19  Yes [provider]  topiramate (TOPAMAX) 25 MG tablet Take 25 mg by mouth at  bedtime.    Yes [provider]  traZODone (DESYREL) 150 MG tablet Take 150 mg by mouth at bedtime as needed for sleep.   Yes [provider]  triamcinolone lotion (KENALOG) 0.1 % APPLY TOPICALLY TO SCALP AT BEDTIME AS DIRECTED 07/07/20  Yes [provider]  TUBERCULIN SYR 1CC/27GX1/2" (B-D TB SYRINGE 1CC/27GX1/2") 27G X 1/2" 1 ML MISC 12 Syringes by Does not apply route once a week. 10/16/20  Yes Deveshwar, Abel Presto, MD  valACYclovir (VALTREX) 1000 MG tablet Take 1 tablet (1,000 mg total) by mouth daily. 08/12/20  Yes Tommy Medal, Lavell Islam, MD  nystatin (MYCOSTATIN) 100000 UNIT/ML suspension Take 10 mLs (1,000,000 Units total) by mouth 3 (three) times daily. 12/07/20   Truman Hayward, MD     Positive ROS: Otherwise negative  All other systems have been reviewed and were otherwise negative with the exception of those mentioned in the HPI and as above.  Physical Exam: Constitutional: Alert, well-appearing, no acute distress Ears: External ears without lesions or tenderness. Ear canals are clear bilaterally with intact, clear TMs.  Nasal: External nose without lesions. . Clear nasal passages Oral: Lips and gums without lesions.  Patient has what appears to be thrush on both oral buccal mucosa was.  Left side may be slightly worse than the  right side.  A biopsy of this was obtained in the office.  The area was injected with 1 cc of Xylocaine with epinephrine and then approximate 8 mm -10 mm mucosa was excised along with the white plaque.  This was sent in formalin to pathology.  Hemostasis was obtained with silver nitrate and pressure with gauze sponge. Neck: No palpable adenopathy or masses Respiratory: Breathing comfortably  Skin: No facial/neck lesions or rash noted.  Procedures  Assessment: History of recurrent thrush in patient with immunodeficiency  Plan: A biopsy of the buccal oral mucosa was obtained in the office today.   Radene Journey, MD

## 2020-12-08 LAB — SURGICAL PATHOLOGY

## 2020-12-14 ENCOUNTER — Telehealth: Payer: Self-pay | Admitting: *Deleted

## 2020-12-14 NOTE — Telephone Encounter (Signed)
I do not think we need to be giving her repeated fluid boluses. I am also strongly considering stopping the IVIG though she believes it is helping he w resp infecftions. I actually do not thuink she has thrush at all by the way but geographic tongue

## 2020-12-14 NOTE — Telephone Encounter (Signed)
Patient called for clarification, as a nurse in short stay told her she got her IVIG infusions q 8 weeks. Per Dr Lucianne Lei Dam's order, IVIG is written for q 4 weeks. RN spoke with Laverne in Short Stay, scheduled patient for infusion 12/30 at 10 am.  RN relayed to patient.  Patient had questions about previous one time IV fluid bolus, asked if this could be each time as she sometimes has difficulty drinking due to thrust.  RN advised her to discuss need for fluid boluses with Dr Tommy Medal at her upcoming appointment 1/5, as she recently received treatment for thrush from Dr Tommy Medal and is feeling better. She will focus on hydration in the mean time. Landis Gandy, RN

## 2020-12-24 ENCOUNTER — Ambulatory Visit (HOSPITAL_COMMUNITY)
Admission: RE | Admit: 2020-12-24 | Discharge: 2020-12-24 | Disposition: A | Discharge status: Home / Self Care | Payer: Managed Care, Other (non HMO) | Source: Ambulatory Visit | Attending: Infectious Disease | Admitting: Infectious Disease

## 2020-12-24 ENCOUNTER — Other Ambulatory Visit: Payer: Self-pay

## 2020-12-24 DIAGNOSIS — D808 Other immunodeficiencies with predominantly antibody defects: Secondary | ICD-10-CM | POA: Diagnosis present

## 2020-12-24 MED ORDER — IMMUNE GLOBULIN (HUMAN) 10 GM/100ML IV SOLN
30.0000 g | INTRAVENOUS | Status: DC
  Administered 2020-12-24: 11:00:00 30 g via INTRAVENOUS
  Filled 2020-12-24: qty 100

## 2020-12-27 NOTE — Progress Notes (Deleted)
Follow Up Note  RE: [name] Horn MRN: [id number] DOB: 06-05-1976 Date of Office Visit: 12/28/2020  Referring provider: Alan Ripper, PA Primary care provider: Alan Horn, [place]  Chief Complaint: No chief complaint on file.  History of Present Illness: I had the pleasure of seeing [name] Horn for a follow up visit at the [facility] [place] [facility] [place] on 12/27/2020. She is a 45 y.o. female, who is being followed for specific antibody deficiency, eosinophilic esophagitis and chronic rhinitis. Her previous allergy office visit was on 06/24/2020 with Dr. Maudie Horn. Today is a regular follow up visit.  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 Initially thought to be due to her swallowed Flovent used for EoE. However, she stopped using Flovent in January and had multiple reoccurrences since then. Treated with oral fluconazole and nystatin wash which help while on the medication. Recently diagnosed with RA and started on methotrexate. Having some episodes of fevers with these thrush. History of C diff x 3, shingles, pneumonia x 1, strep throat s/p tonsillectomy, poor dentitions. 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  Normal immunoglobulin levels in 2020. Normal IgG, IgA, IgM, IgE, CH50, CD19, CD3, CD4, CD8, NK cells, good tetanus and diptheria titers; nonreactive to HIV and hepatitis panel. Poor pneumococcal titers despite both pneumovax and prevnar vaccine within the past 6 months. Normal neutrophil oxd burst test. Interim history - evaluated by ID and recommended to start IVIG.   Follow up with ID as scheduled and continue recommendations as per them.   Keep track of infections.  If no improvement, may benefit from immunology consultation at a tertiary/academic center.  Eosinophilic esophagitis Past  history - Diagnosed with EoE meany years ago and was on Flovent for 3 years. Patient had dilatations in the past. 2021 food panel was negative.  Interim history - tried to eliminate dairy but having difficultly as that is one time that she can tolerate with her other GI issues. No EGD per GI yet. Swallowed steroids were stopped due to recurrent thrush.  Follow up with GI - continue recommendations per them including continue PPI.  Maybe worthwhile to revisit elimination diet once the thrush has resolved.   Chronic rhinitis Past history - Mild rhino conjunctivitis symptoms in the fall. 2021 testing negative to environmental allergies.  Interim history - stable with no meds.  Return in about 6 months (around 12/24/2020).  11/18/2020 ID visit: "Oral lesions: Continue suspect this is geographic tongue and not related to an infectious disease.  I would like to continue observe her off of [place] therapy.  I encouraged her to use viscous lidocaine to help with pain control and I am putting in a referral for her to straight see ear nose and throat again.  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   ARea that she isconcerned about being potentially infected in her calf: On exam there really is not much here that I can appreciate in terms of evidence  of an abscess.  She is apparently on some doxycycline at present"  12/07/2020 tongue bx: "A. BUCCAL MUCOSA, LEFT CHEEK, BIOPSY:  - Squamous mucosa with hyperkeratosis and fungus consistent with  Candida.  - No dysplasia or malignancy. "  Assessment  and Plan: [name] Horn is a 45 y.o. female with: No problem-specific Assessment & Plan notes found for this encounter.  No follow-ups on file.  No orders of the defined types were placed in this encounter.  Lab Orders  No laboratory test(s) ordered today    Diagnostics: Spirometry:  Tracings reviewed. Her effort: {Blank single:19197::"Good reproducible efforts.","It was hard to get consistent efforts and there is a question as to whether this reflects a maximal maneuver.","Poor effort, data can not be interpreted."} FVC: ***L FEV1: ***L, ***% predicted FEV1/FVC ratio: ***% Interpretation: {Blank single:19197::"Spirometry consistent with mild obstructive disease","Spirometry consistent with moderate obstructive disease","Spirometry consistent with severe obstructive disease","Spirometry consistent with possible restrictive disease","Spirometry consistent with mixed obstructive and restrictive disease","Spirometry uninterpretable due to technique","Spirometry consistent with normal pattern","No overt abnormalities noted given today's efforts"}.  Please see scanned spirometry results for details.  Skin Testing: {Blank single:19197::"Select foods","Environmental allergy panel","Environmental allergy panel and select foods","Food allergy panel","None","Deferred due to recent antihistamines use"}. Positive test to: ***. Negative test to: ***.  Results discussed with patient/family.   Medication List:  Current Outpatient Medications  Medication Sig Dispense Refill  . acyclovir (ZOVIRAX) 400 MG tablet Take 400 mg by mouth 3 (three) times daily.     Marland Kitchen amphetamine-dextroamphetamine (ADDERALL) 20 MG tablet Take 20 mg by mouth 3 (three) times daily.    . ANTACID 200-200-20 MG/5ML suspension Take by mouth.    Marland Kitchen atropine 1 % ophthalmic solution Place 1 drop into the left eye daily as needed (for dry eye).    . baclofen (LIORESAL) 10 MG tablet Take 10 mg by mouth daily.    . Bepotastine Besilate 1.5 %  SOLN Place 1 drop into both eyes 2 (two) times a day.    . cycloSPORINE (RESTASIS) 0.05 % ophthalmic emulsion Place 1 drop into both eyes 2 (two) times daily.    Marland Kitchen dicyclomine (BENTYL) 10 MG capsule Take 10 mg by mouth daily as needed for nausea/vomiting.    . fluconazole (DIFLUCAN) 200 MG tablet Take 1 tablet (200 mg total) by mouth daily. 30 tablet 1  . folic acid (FOLVITE) 1 MG tablet Take 2 tablets (2 mg total) by mouth daily. 180 tablet 3  . furosemide (LASIX) 20 MG tablet Take 20 mg by mouth daily as needed.     . hyoscyamine (LEVSIN, ANASPAZ) 0.125 MG tablet Take 0.125 mg by mouth every 4 (four) hours as needed for cramping.     Marland Kitchen LORazepam (ATIVAN) 1 MG tablet Take 1 mg by mouth 3 (three) times daily as needed.    . methotrexate 50 MG/2ML injection Inject 0.72mL into the skin once weekly. 10 mL 0  . metoCLOPramide (REGLAN) 10 MG tablet Take 10 mg by mouth daily.     . metoprolol succinate (TOPROL-XL) 25 MG 24 hr tablet Take 25 mg by mouth daily.    Marland Kitchen nystatin (MYCOSTATIN) 100000 UNIT/ML suspension Take 10 mLs (1,000,000 Units total) by mouth 3 (three) times daily. 60 mL 2  . ondansetron (ZOFRAN-ODT) 4 MG disintegrating tablet Take 1 tablet (4 mg total) by mouth every 8 (eight) hours as needed for nausea or vomiting. 15 tablet 0  . oxyCODONE-acetaminophen (PERCOCET) 7.5-325 MG tablet     . pantoprazole (PROTONIX) 40 MG tablet Take  1 tablet (40 mg total) by mouth 2 (two) times daily. 60 tablet 1  . Polyethyl Glycol-Propyl Glycol (SYSTANE) 0.4-0.3 % GEL ophthalmic gel Place 1 application into both eyes daily as needed (dryness).    . potassium chloride (K-DUR) 10 MEQ tablet Take 10 mEq by mouth daily as needed.     . pravastatin (PRAVACHOL) 40 MG tablet Take 40 mg by mouth daily.    . prednisoLONE acetate (PRED FORTE) 1 % ophthalmic suspension Place 1 drop into the left eye 2 (two) times daily.    . promethazine (PHENERGAN) 25 MG suppository Place 1 suppository (25 mg total) rectally every 6  (six) hours as needed for nausea or vomiting. 12 each 0  . SUMAtriptan (IMITREX) 25 MG tablet Take 25 mg by mouth every 2 (two) hours as needed for migraine. May repeat in 2 hours if headache persists or recurs.    . topiramate (TOPAMAX) 100 MG tablet Take 100 mg by mouth daily.    Marland Kitchen topiramate (TOPAMAX) 25 MG tablet Take 25 mg by mouth at bedtime.     . traZODone (DESYREL) 150 MG tablet Take 150 mg by mouth at bedtime as needed for sleep.    Marland Kitchen triamcinolone lotion (KENALOG) 0.1 % APPLY TOPICALLY TO SCALP AT BEDTIME AS DIRECTED    . TUBERCULIN SYR 1CC/27GX1/2" (B-D TB SYRINGE 1CC/27GX1/2") 27G X 1/2" 1 ML MISC 12 Syringes by Does not apply route once a week. 12 each 3  . valACYclovir (VALTREX) 1000 MG tablet Take 1 tablet (1,000 mg total) by mouth daily. 30 tablet 5   No current facility-administered medications for this visit.   Allergies: Allergies  Allergen Reactions  . Other     Dermabond surgical glue.   . Sulfa Antibiotics Anaphylaxis, Shortness Of Breath, Swelling and Rash    Angioedema (also)  . Sulfonamide Derivatives Hives, Shortness Of Breath and Swelling    TONGUE SWELLS  . Benadryl [Diphenhydramine Hcl] Other (See Comments)    Hyperactivity    I reviewed her past medical history, social history, family history, and environmental history and no significant changes have been reported from her previous visit.  Review of Systems  Constitutional: Negative for appetite change, chills, fever and unexpected weight change.  HENT: Positive for sore throat and voice change. Negative for congestion and rhinorrhea.   Eyes: Negative for itching.  Respiratory: Negative for cough, chest tightness, shortness of breath and wheezing.   Cardiovascular: Negative for chest pain.  Gastrointestinal: Positive for abdominal pain.  Genitourinary: Negative for difficulty urinating.  Skin: Negative for rash.  Allergic/Immunologic: Negative for environmental allergies and food allergies.   Neurological: Negative for headaches.   Objective: There were no vitals taken for this visit. There is no height or weight on file to calculate BMI. Physical Exam Vitals and nursing note reviewed.  Constitutional:      Appearance: Normal appearance. She is well-developed.  HENT:     Head: Normocephalic and atraumatic.     Right Ear: Tympanic membrane and external ear normal.     Left Ear: Tympanic membrane and external ear normal.     Nose: Nose normal.     Mouth/Throat:     Mouth: Mucous membranes are moist.     Pharynx: Oropharynx is clear.  Eyes:     Conjunctiva/sclera: Conjunctivae normal.  Cardiovascular:     Rate and Rhythm: Normal rate and regular rhythm.     Heart sounds: Normal heart sounds. No murmur heard.   Pulmonary:  Effort: Pulmonary effort is normal.     Breath sounds: Normal breath sounds. No wheezing, rhonchi or rales.  Musculoskeletal:     Cervical back: Neck supple.  Skin:    General: Skin is warm.     Findings: No rash.  Neurological:     Mental Status: She is alert and oriented to person, place, and time.  Psychiatric:        Behavior: Behavior normal.    Previous notes and tests were reviewed. The plan was reviewed with the patient/family, and all questions/concerned were addressed.  It was my pleasure to see Thana today and participate in her care. Please feel free to contact me with any questions or concerns.  Sincerely,  Rexene Alberts, DO Allergy & Immunology  [facility] office: [place] office: [phone]

## 2020-12-28 ENCOUNTER — Ambulatory Visit: Payer: Managed Care, Other (non HMO) | Admitting: Allergy

## 2020-12-29 NOTE — Progress Notes (Signed)
Office Visit Note  Patient: [name] Horn             Date of Birth: 1976-11-23           MRN: [id number]             PCP: Chiquita Loth, PA Referring: Chiquita Loth, PA Visit Date: 01/12/2021 Occupation: @GUAROCC @  Subjective:  Other (Discuss medication options. D/C MTX per infectious disease MD)   History of Present Illness: [name] Horn is a 45 y.o. female with history of rheumatoid arthritis.  She developed oral lesions for which she has been under care of Dr. 49.  She was tried on IVIG without any response.  Dr. Daiva Eves does not believe that she has and needs to infection.  He is referred her to [facility] immunology for further advice.  He also recommended stopping methotrexate that that may be contributing to her oral symptoms.  She has been followed by GI regarding esophageal esophagitis.  She states she may require esophageal dilation as needed.  She is also on Valtrex for HSV 1 prophylaxis.  She complains of some discomfort in her hands and wrist joints.  She also has active Raynauds due to cold weather.  Activities of Daily Living:  Patient reports morning stiffness for 30-45 minutes.   Patient Reports nocturnal pain.  Difficulty dressing/grooming: Denies Difficulty climbing stairs: Denies Difficulty getting out of chair: Denies Difficulty using hands for taps, buttons, cutlery, and/or writing: Reports  Review of Systems  Constitutional: Positive for fatigue. Negative for night sweats, weight gain and weight loss.  HENT: Positive for nose dryness. Negative for mouth sores, trouble swallowing, trouble swallowing and mouth dryness.   Eyes: Negative for pain, redness, itching, visual disturbance and dryness.  Respiratory: Negative for cough, shortness of breath and difficulty breathing.   Cardiovascular: Negative for chest pain, palpitations, hypertension, irregular heartbeat and swelling in legs/feet.  Gastrointestinal: Negative for blood in stool, constipation  and diarrhea.  Endocrine: Negative for increased urination.  Genitourinary: Positive for difficulty urinating. Negative for vaginal dryness.  Musculoskeletal: Positive for arthralgias, joint pain, joint swelling, myalgias, morning stiffness, muscle tenderness and myalgias. Negative for muscle weakness.  Skin: Positive for color change. Negative for rash, hair loss, redness, skin tightness, ulcers and sensitivity to sunlight.  Allergic/Immunologic: Positive for susceptible to infections.  Neurological: Positive for numbness, headaches and weakness. Negative for dizziness, memory loss and night sweats.  Hematological: Positive for anemia. Negative for swollen glands.  Psychiatric/Behavioral: Negative for depressed mood, confusion and sleep disturbance. The patient is not nervous/anxious.     PMFS History:  Patient Active Problem List   Diagnosis Date Noted  . Fatigue 12/30/2020  . Malaise 12/30/2020  . FUO (fever of unknown origin) 10/05/2020  . Stomatitis 09/03/2020  . Rheumatoid arthritis (HCC) 08/12/2020  . Herpes labialis 08/12/2020  . Transaminitis 08/12/2020  . 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 Intertrigo 06/22/2020  . Interstitial cystitis 05/18/2020  . 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 Chronic rhinitis 04/23/2020  . Adverse food reaction 04/23/2020  . Eosinophilic esophagitis 04/03/2020  . Oral candidiasis 04/02/2020  . History of Clostridium difficile infection 04/02/2020  . History of shingles 04/02/2020  . History of asthma 04/02/2020  . Multiple drug allergies 04/02/2020  . Environmental allergies 04/02/2020  . History of loop recorder 01/19/2020  . PVC (premature ventricular contraction) 01/19/2020  . Uterine cancer (HCC) 01/19/2020  .  Colitis 08/14/2019  . Constipation   . Gastroparesis 04/03/2019  . Palpitations 04/10/2018  . Nausea and vomiting  04/26/2017  . Nausea & vomiting 04/25/2017  . Chest pain 04/25/2017  . Enterocolitis due to Clostridium difficile 01/04/2016  . Incisional hernia without obstruction or gangrene 11/18/2015  . Personal history of other diseases of the nervous system and sense organs 10/14/2015  . Disease of pancreas 01/21/2015  . Other specified bacterial agents as the cause of diseases classified elsewhere 01/21/2015  . Radiculopathy of cervical region 12/09/2014  . Annual physical exam 01/30/2012  . Melena 01/30/2012  . Pain in ankle 11/24/2010  . ANXIETY 10/11/2010  . DVT 10/11/2010  . COSTOCHONDRITIS 03/03/2008  . Cellulitis of toe 12/01/2007  . MENOPAUSE, PREMATURE 07/20/2007  . UTERINE CANCER, HX OF 07/16/2007  . Other postprocedural status(V45.89) 07/16/2007    Past Medical History:  Diagnosis Date  . Complication of anesthesia    " i TAKE A LIITLE BIT LONGER TO [place] UP "  . DVT (deep venous thrombosis) ([place])    x2  . Fatigue 12/30/2020  . Gastroparesis 08/07/2019  . 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 Herpes labialis 08/12/2020  . Intertrigo 06/22/2020  . Lymphedema   . Malaise 12/30/2020  . RA (rheumatoid arthritis) ([place])   . Rheumatoid arthritis ([place]) 08/12/2020  . Transaminitis 08/12/2020  . Uterine cancer ([place])     Family History  Problem Relation Age of Onset  . Hypertension Other   . Hyperlipidemia Other   . Colon cancer Other        grandmother  . Hashimoto's thyroiditis Mother   . Eczema Mother   . Angioedema Mother   . Throat cancer Father   . Arrhythmia Father   . Angioedema Maternal Grandfather    Past Surgical History:  Procedure Laterality Date  . APPENDECTOMY    . cheek biopsy  11/2020  . CHOLECYSTECTOMY    . ESOPHAGOGASTRODUODENOSCOPY N/A 04/30/2017   Procedure: ESOPHAGOGASTRODUODENOSCOPY (EGD);  Surgeon: Teena Irani, MD;  Location[facility] ENDOSCOPY;  Service: Endoscopy;  Laterality: N/A;  . HERNIA REPAIR     x2  . KNEE SURGERY     x3  . MINIMALLY INVASIVE  FORAMINOTOMY CERVICAL SPINE     C6-T1, Nitka  . SAVORY DILATION N/A 04/30/2017   Procedure: SAVORY DILATION;  Surgeon: Teena Irani, MD;  Location: [facility] ENDOSCOPY;  Service: Endoscopy;  Laterality: N/A;  . TONSILLECTOMY    . TOTAL ABDOMINAL HYSTERECTOMY     Sarcoma, s/p XRT   Social History   Social History Narrative  . Not on file   Immunization History  Administered Date(s) Administered  . Influenza, Seasonal, Injecte, Preservative Fre 10/22/2014, 10/23/2015, 09/10/2018  . Influenza,inj,Quad PF,6+ Mos 11/21/2017, 09/10/2018  . Influenza-Unspecified 10/22/2014, 10/23/2015  . PFIZER(Purple Top)SARS-COV-2 Vaccination 12/27/2019, 02/24/2020, 09/09/2020  . Pneumococcal Conjugate-13 10/30/2019  . Tdap 04/19/2020     Objective: Vital Signs: BP 109/74 (BP Location: Left Arm, Patient Position: Sitting, Cuff Size: Normal)   Pulse 73   Resp 15   Ht 5\' 4"  (1.626 m)   Wt 164 lb 6.4 oz (74.6 kg)   BMI 28.22 kg/m    Physical Exam Vitals and nursing note reviewed.  Constitutional:      Appearance: She is well-developed and well-nourished.  HENT:     Head: Normocephalic and atraumatic.  Eyes:     Extraocular Movements: EOM normal.     Conjunctiva/sclera: Conjunctivae normal.  Cardiovascular:     Rate and Rhythm: Normal rate and regular rhythm.  Pulses: Intact distal pulses.     Heart sounds: Normal heart sounds.  Pulmonary:     Effort: Pulmonary effort is normal.     Breath sounds: Normal breath sounds.  Abdominal:     General: Bowel sounds are normal.     Palpations: Abdomen is soft.  Musculoskeletal:     Cervical back: Normal range of motion.  Lymphadenopathy:     Cervical: No cervical adenopathy.  Skin:    General: Skin is warm and dry.     Capillary Refill: Capillary refill takes less than 2 seconds.  Neurological:     Mental Status: She is alert and oriented to person, place, and time.  Psychiatric:        Mood and Affect: Mood and affect normal.        Behavior:  Behavior normal.      Musculoskeletal Exam: C-spine was in good range of motion.  Shoulder joints, elbow joints, wrist joints, MCPs PIPs and DIPs with good range of motion with no synovitis.  Hip joints, knee joints, ankles, MTPs and PIPs with good range of motion with no synovitis.  CDAI Exam: CDAI Score: 0.4  Patient Global: 2 mm; Provider Global: 2 mm Swollen: 0 ; Tender: 0  Joint Exam 01/12/2021   No joint exam has been documented for this visit   There is currently no information documented on the homunculus. Go to the Rheumatology activity and complete the homunculus joint exam.  Investigation: No additional findings.  Imaging: No results found.  Recent Labs: Lab Results  Component Value Date   WBC 3.3 (L) 12/30/2020   HGB 11.3 (L) 12/30/2020   PLT 186 12/30/2020   NA 140 12/30/2020   K 4.3 12/30/2020   CL 108 12/30/2020   CO2 25 12/30/2020   GLUCOSE 73 12/30/2020   BUN 9 12/30/2020   CREATININE 0.83 12/30/2020   BILITOT 0.4 12/30/2020   ALKPHOS 71 01/26/2020   AST 33 12/30/2020   ALT 20 12/30/2020   PROT 6.9 12/30/2020   ALBUMIN 4.1 01/26/2020   CALCIUM 9.4 12/30/2020   GFRAA 98 12/30/2020   QFTBGOLDPLUS NEGATIVE 10/23/2019    Speciality Comments: No specialty comments available.  Procedures:  No procedures performed Allergies: Other, Sulfa antibiotics, Sulfonamide derivatives, and Benadryl [diphenhydramine hcl]   Assessment / Plan:     Visit Diagnoses: Rheumatoid arthritis involving multiple sites with positive rheumatoid factor (HCC)-patient had no synovitis on examination.  She has been taking methotrexate until last week.  She stopped methotrexate per recommendations of Dr. Tommy Medal.  As he thought that methotrexate may be contributing to her oral lesions.  High risk medication use -(MTX 0.8 ml sq once weekly-on hold) and folic acid 2 mg po daily  Keratoconjunctivitis sicca (HCC) - Followed by Dr. Katy Fitch for recurrent conjunctivitis and  keratoconjunctivitis.  AVISE index -2.4, all labs were negative except RF IgA 39.   Carpal tunnel syndrome of right wrist-patient states she is a still symptomatic.  Phalen's and Tinel's are negative.  Raynaud's syndrome without gangrene-she continues to have some Raynaud's symptoms.  Geographic tongue - And stomatitis.  She had no response to IVIG.  She will be seeing [facility] immunology for further work-up.  She has been under care of Dr. Tommy Medal.  I reviewed records.  Eosinophilic esophagitis - Hx of dilatations. Responsive to oral prednisone.  DDD (degenerative disc disease), cervical-she continues to have some stiffness.  Myofascial pain-she has generalized pain and positive tender points.  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 Tommy Medal.  She had no response to IVIG.  She will be seeing an Engineer, structural at [facility].  History of shingles-she is on prophylactic treatment.  Other medical problems are listed as follows:  History of anxiety  UTERINE CANCER, HX OF  History of Clostridioides difficile colitis  Gastroparesis  Orders: No orders of the defined types were placed in this encounter.  No orders of the defined types were placed in this encounter.    Follow-Up Instructions: Return in about 3 months (around 04/12/2021) for Rheumatoid arthritis.   Bo Merino, MD  Note - This record has been created using Editor, commissioning.  Chart creation errors have been sought, but may not always  have been located. Such creation errors do not reflect on  the standard of medical care.

## 2020-12-30 ENCOUNTER — Other Ambulatory Visit: Payer: Self-pay

## 2020-12-30 ENCOUNTER — Encounter: Payer: Self-pay | Admitting: Infectious Disease

## 2020-12-30 ENCOUNTER — Ambulatory Visit (INDEPENDENT_AMBULATORY_CARE_PROVIDER_SITE_OTHER): Payer: 59 | Admitting: Infectious Disease

## 2020-12-30 VITALS — BP 91/61 | HR 90 | Resp 16 | Ht 64.0 in | Wt 162.0 lb

## 2020-12-30 DIAGNOSIS — D808 Other immunodeficiencies with predominantly antibody defects: Secondary | ICD-10-CM | POA: Diagnosis not present

## 2020-12-30 DIAGNOSIS — R5383 Other fatigue: Secondary | ICD-10-CM | POA: Insufficient documentation

## 2020-12-30 DIAGNOSIS — Z8619 Personal history of other infectious and parasitic diseases: Secondary | ICD-10-CM | POA: Diagnosis not present

## 2020-12-30 DIAGNOSIS — K141 Geographic tongue: Secondary | ICD-10-CM

## 2020-12-30 DIAGNOSIS — R5381 Other malaise: Secondary | ICD-10-CM

## 2020-12-30 DIAGNOSIS — K200 Eosinophilic esophagitis: Secondary | ICD-10-CM | POA: Diagnosis not present

## 2020-12-30 DIAGNOSIS — B370 Candidal stomatitis: Secondary | ICD-10-CM

## 2020-12-30 DIAGNOSIS — A0472 Enterocolitis due to Clostridium difficile, not specified as recurrent: Secondary | ICD-10-CM

## 2020-12-30 HISTORY — DX: Other malaise: R53.81

## 2020-12-30 HISTORY — DX: Other fatigue: R53.83

## 2020-12-30 NOTE — Progress Notes (Signed)
Subjective:   Chief complaint:  Complaints of malaise and profound fatigue    Patient ID: [name] Horn, female    DOB: 1976/03/07, 45 y.o.   MRN: [id number]  HPI   45 year old Caucasian lady with hx of seropositive RA with keratoconjunctivitis sicca, and osteoarthritis.who  Started on MTX in November of  2020. She also  Has hx of eosinophilic esophagitis and was on fluticasone. She has had history of C difficile with 3 episodes. She  Had problems with recurrent strep throat and had tonsillectomy, pneumonia a few times, ear infection, tooth abscess. She  Has had  Shingles outbreak already. She then began to have thrush in January  which was attributed to her  Oral corticosteroid therapy but  Has persisted despite this being stopped.   She was taking nystatin swish and  Swallow and previously twice weekly fluconazole at 200 mg  While her  Overall immunoglobulin levels were normal she did not mount immune response to vaccination with pneumovax and prevnar with antibodies checked > 4 months after this that were low.  She was bitten by a cat and recently had tetanus vaccine in ER and antibiotics.  After 2 COVID vaccines she had not much of a response in terms of flu like  Symptoms etc that are typically experienced by patients with 2 dose mRNA based vaccines.   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  During one visit several visits ago she appeared to be suffering from intertrigo.  I increased her fluconazole 200 mg daily prophylactic dose and a higher dose  She then  continued to have abnormalities on her tongue but it was not clear to me that this is really thrush but could be something else such as geographic tongue for example.  I ended up taking her off of the fluconazole with desire to see how she did off of it.  She ended up developing stomatitis but missed her appointment with Dr.  Megan Salon was late again and then finally did see him he did a swab for fungal culture which is growing copious Candida albicans   Sensitivity data on her candida showed:  organism CANDIDA ALBICANS   Antimicrobial 1 FLUCONAZOLE   MIC 0.500 S   Antimicrobial 2 VORICONAZOLE   MIC <=0.008 S   Antimicrobial 3 POSACONAZOLE   MIC 0.030   Antimicrobial 4 CASPOFUNGIN   MIC 0.015 S   Comment: .    She remained on fluconazole at high dose since then and was seen by Terri Piedra in the interim.  Her tongue lesions had not resolved though she was telling me that her tongue was "completely pink in week prior to one visit.  Again I have felt that her story fits much better with geographic tongue then with candidiasis.  I wanted to observe her again off of antifungal therapy.  She had a area in her left calf that she said was began to hurt again where there have been an abscess discovered at clinic in [place] to read the note from general surgery at [facility].  Apparently the area there was she was thought to have an abscess was not revealed on ultrasound of the lower extremity and not found to be present when seen by general surgery.  Since taking her off of the fluconazole last week her tongue actually looked improved.  She did  say she  is having some soreness in the entire of her mouth.  I really have not thought this has anything to do with Candida though.   Since I last saw her she was OFF of azole therapy and saw Dr. Redmond Baseman who did perform a biopsy but not of the tongue but rather the buccal mucosa where she had more inflammation and possible thrush.  Biopsy came back without malignancy, hyperkeratosis with some yeast as well  She has gone back on nystatin (and also fluconazole) though I had really only wanted her to start the former.  Tongue looks stable to improved to me and buccal mucosa resolved.  She has had IVIG  She c/o profound fatigue and malaise.     Past  Medical History:  Diagnosis Date  . Complication of anesthesia    " i TAKE A LIITLE BIT LONGER TO [place] UP "  . DVT (deep venous thrombosis) ([place])    x2  . Gastroparesis 08/07/2019  . 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 Herpes labialis 08/12/2020  . Intertrigo 06/22/2020  . Lymphedema   . RA (rheumatoid arthritis) ([place])   . Rheumatoid arthritis ([place]) 08/12/2020  . Transaminitis 08/12/2020  . Uterine cancer [facility])     Past Surgical History:  Procedure Laterality Date  . APPENDECTOMY    . CHOLECYSTECTOMY    . ESOPHAGOGASTRODUODENOSCOPY N/A 04/30/2017   Procedure: ESOPHAGOGASTRODUODENOSCOPY (EGD);  Surgeon: Teena Irani, MD;  Location: [facility] ENDOSCOPY;  Service: Endoscopy;  Laterality: N/A;  . HERNIA REPAIR     x2  . KNEE SURGERY     x3  . MINIMALLY INVASIVE FORAMINOTOMY CERVICAL SPINE     C6-T1, Nitka  . SAVORY DILATION N/A 04/30/2017   Procedure: SAVORY DILATION;  Surgeon: Teena Irani, MD;  Location: [facility] ENDOSCOPY;  Service: Endoscopy;  Laterality: N/A;  . TONSILLECTOMY    . TOTAL ABDOMINAL HYSTERECTOMY     Sarcoma, s/p XRT    Family History  Problem Relation Age of Onset  . Hypertension Other   . Hyperlipidemia Other   . Colon cancer Other        grandmother  . Hashimoto's thyroiditis Mother   . Eczema Mother   . Angioedema Mother   . Throat cancer Father   . Arrhythmia Father   . Angioedema Maternal Grandfather       Social History   Socioeconomic History  . Marital status: Married    Spouse name: Not on file  . Number of children: Not on file  . Years of education: Not on file  . Highest education level: Not on file  Occupational History  . Not on file  Tobacco Use  . Smoking status: Passive Smoke Exposure - Never Smoker  . Smokeless tobacco: Never Used  Vaping Use  . Vaping Use: Never used  Substance and Sexual Activity  . Alcohol use: Not Currently  . Drug use: No  . Sexual activity: Yes    Partners: Male    Birth control/protection: None  Other Topics Concern   . Not on file  Social History Narrative  . Not on file   Social Determinants of Health   Financial Resource Strain: Not on file  Food Insecurity: Not on file  Transportation Needs: Not on file  Physical Activity: Not on file  Stress: Not on file  Social Connections: Not on file    Allergies  Allergen Reactions  . Other     Dermabond surgical glue.   . Sulfa Antibiotics Anaphylaxis, Shortness Of Breath, Swelling and Rash  Angioedema (also)  . Sulfonamide Derivatives Hives, Shortness Of Breath and Swelling    TONGUE SWELLS  . Benadryl [Diphenhydramine Hcl] Other (See Comments)    Hyperactivity      Current Outpatient Medications:  .  acyclovir (ZOVIRAX) 400 MG tablet, Take 400 mg by mouth 3 (three) times daily. , Disp: , Rfl:  .  amphetamine-dextroamphetamine (ADDERALL) 20 MG tablet, Take 20 mg by mouth 3 (three) times daily., Disp: , Rfl:  .  ANTACID 200-200-20 MG/5ML suspension, Take by mouth., Disp: , Rfl:  .  atropine 1 % ophthalmic solution, Place 1 drop into the left eye daily as needed (for dry eye)., Disp: , Rfl:  .  baclofen (LIORESAL) 10 MG tablet, Take 10 mg by mouth daily., Disp: , Rfl:  .  Bepotastine Besilate 1.5 % SOLN, Place 1 drop into both eyes 2 (two) times a day., Disp: , Rfl:  .  cycloSPORINE (RESTASIS) 0.05 % ophthalmic emulsion, Place 1 drop into both eyes 2 (two) times daily., Disp: , Rfl:  .  dicyclomine (BENTYL) 10 MG capsule, Take 10 mg by mouth daily as needed for nausea/vomiting., Disp: , Rfl:  .  fluconazole (DIFLUCAN) 200 MG tablet, Take 1 tablet (200 mg total) by mouth daily., Disp: 30 tablet, Rfl: 1 .  folic acid (FOLVITE) 1 MG tablet, Take 2 tablets (2 mg total) by mouth daily., Disp: 180 tablet, Rfl: 3 .  furosemide (LASIX) 20 MG tablet, Take 20 mg by mouth daily as needed. , Disp: , Rfl:  .  LORazepam (ATIVAN) 1 MG tablet, Take 1 mg by mouth 3 (three) times daily as needed., Disp: , Rfl:  .  methotrexate 50 MG/2ML injection, Inject 0.56mL  into the skin once weekly., Disp: 10 mL, Rfl: 0 .  metoCLOPramide (REGLAN) 10 MG tablet, Take 10 mg by mouth daily. , Disp: , Rfl:  .  metoprolol succinate (TOPROL-XL) 25 MG 24 hr tablet, Take 25 mg by mouth daily., Disp: , Rfl:  .  nystatin (MYCOSTATIN) 100000 UNIT/ML suspension, Take 10 mLs (1,000,000 Units total) by mouth 3 (three) times daily., Disp: 60 mL, Rfl: 2 .  ondansetron (ZOFRAN-ODT) 4 MG disintegrating tablet, Take 1 tablet (4 mg total) by mouth every 8 (eight) hours as needed for nausea or vomiting., Disp: 15 tablet, Rfl: 0 .  oxyCODONE-acetaminophen (PERCOCET) 7.5-325 MG tablet, , Disp: , Rfl:  .  pantoprazole (PROTONIX) 40 MG tablet, Take 1 tablet (40 mg total) by mouth 2 (two) times daily., Disp: 60 tablet, Rfl: 1 .  Polyethyl Glycol-Propyl Glycol (SYSTANE) 0.4-0.3 % GEL ophthalmic gel, Place 1 application into both eyes daily as needed (dryness)., Disp: , Rfl:  .  potassium chloride (K-DUR) 10 MEQ tablet, Take 10 mEq by mouth daily as needed. , Disp: , Rfl:  .  pravastatin (PRAVACHOL) 40 MG tablet, Take 40 mg by mouth daily., Disp: , Rfl:  .  prednisoLONE acetate (PRED FORTE) 1 % ophthalmic suspension, Place 1 drop into the left eye 2 (two) times daily., Disp: , Rfl:  .  promethazine (PHENERGAN) 25 MG suppository, Place 1 suppository (25 mg total) rectally every 6 (six) hours as needed for nausea or vomiting., Disp: 12 each, Rfl: 0 .  SUMAtriptan (IMITREX) 25 MG tablet, Take 25 mg by mouth every 2 (two) hours as needed for migraine. May repeat in 2 hours if headache persists or recurs., Disp: , Rfl:  .  topiramate (TOPAMAX) 100 MG tablet, Take 100 mg by mouth daily., Disp: , Rfl:  .  topiramate (TOPAMAX) 25 MG tablet, Take 25 mg by mouth at bedtime. , Disp: , Rfl:  .  traZODone (DESYREL) 150 MG tablet, Take 150 mg by mouth at bedtime as needed for sleep., Disp: , Rfl:  .  triamcinolone lotion (KENALOG) 0.1 %, APPLY TOPICALLY TO SCALP AT BEDTIME AS DIRECTED, Disp: , Rfl:  .   TUBERCULIN SYR 1CC/27GX1/2" (B-D TB SYRINGE 1CC/27GX1/2") 27G X 1/2" 1 ML MISC, 12 Syringes by Does not apply route once a week., Disp: 12 each, Rfl: 3 .  valACYclovir (VALTREX) 1000 MG tablet, Take 1 tablet (1,000 mg total) by mouth daily., Disp: 30 tablet, Rfl: 5     Review of Systems  Constitutional: Positive for fatigue and fever. Negative for activity change, appetite change, chills, diaphoresis and unexpected weight change.  HENT: Positive for sore throat. Negative for congestion, rhinorrhea, sinus pressure, sneezing and trouble swallowing.   Eyes: Negative for photophobia and visual disturbance.  Respiratory: Negative for cough, chest tightness, wheezing and stridor.   Cardiovascular: Negative for chest pain and palpitations.  Gastrointestinal: Negative for abdominal distention, abdominal pain, anal bleeding, blood in stool, constipation, diarrhea, nausea and vomiting.  Genitourinary: Negative for difficulty urinating, dysuria, flank pain and hematuria.  Musculoskeletal: Negative for arthralgias, back pain, gait problem and joint swelling.  Skin: Negative for color change, pallor and wound.  Neurological: Negative for dizziness, tremors, weakness and light-headedness.  Hematological: Negative for adenopathy. Does not bruise/bleed easily.  Psychiatric/Behavioral: Negative for agitation, behavioral problems, confusion, decreased concentration, dysphoric mood and sleep disturbance.       Objective:   Physical Exam Constitutional:      General: She is not in acute distress.    Appearance: Normal appearance. She is well-developed. She is not ill-appearing or diaphoretic.  HENT:     Head: Normocephalic and atraumatic.     Right Ear: Hearing and external ear normal.     Left Ear: Hearing and external ear normal.     Nose: No nasal deformity or rhinorrhea.     Mouth/Throat:     Mouth: Mucous membranes are moist.     Pharynx: No posterior oropharyngeal erythema.  Eyes:     General:  No scleral icterus.    Extraocular Movements: Extraocular movements intact.     Conjunctiva/sclera: Conjunctivae normal.     Right eye: Right conjunctiva is not injected.     Left eye: Left conjunctiva is not injected.  Neck:     Vascular: No JVD.  Cardiovascular:     Rate and Rhythm: Normal rate and regular rhythm.     Heart sounds: S1 normal and S2 normal. No murmur heard. No gallop.   Pulmonary:     Effort: Pulmonary effort is normal. No respiratory distress.     Breath sounds: No stridor. No wheezing or rhonchi.  Abdominal:     General: Abdomen is flat. There is no distension.     Palpations: Abdomen is soft. There is no mass.     Tenderness: There is abdominal tenderness in the left upper quadrant.  Musculoskeletal:        General: Normal range of motion.     Right shoulder: Normal.     Left shoulder: Normal.     Cervical back: Normal range of motion and neck supple.     Right hip: Normal.     Left hip: Normal.     Right knee: Normal.     Left knee: Normal.  Lymphadenopathy:     Head:  Right side of head: No submandibular, preauricular or posterior auricular adenopathy.     Left side of head: No submandibular, preauricular or posterior auricular adenopathy.     Cervical: No cervical adenopathy.     Right cervical: No superficial or deep cervical adenopathy.    Left cervical: No superficial or deep cervical adenopathy.  Skin:    General: Skin is warm and dry.     Coloration: Skin is not pale.     Findings: No abrasion, bruising, ecchymosis, erythema, lesion or rash.     Nails: There is no clubbing.  Neurological:     General: No focal deficit present.     Mental Status: She is alert and oriented to person, place, and time. Mental status is at baseline.     Sensory: No sensory deficit.     Coordination: Coordination normal.     Gait: Gait normal.  Psychiatric:        Attention and Perception: Attention and perception normal. She is attentive.        Mood and  Affect: Mood is anxious.        Speech: Speech normal.        Behavior: Behavior normal. Behavior is cooperative.        Thought Content: Thought content normal.        Cognition and Memory: Cognition and memory normal.        Judgment: Judgment normal.    Op lesions from photograph she showed me several visits ago.    ]  Tongue  August 12, 2020:    Lips when seen by Dr. 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  Immunodeficiency: I am also concerned that giving her IVIG replacement is not making a difference in her quality of life though she believes it is at present  Leukopenia: White count improved on repeat labs  She is being followed by immunology as well as rheumatology  Rheumatology patient currently on methotrexate but I WOULD consider trial off of this medication which she has NOT had since this whole ordeal began   I spent greater than 40 minutes  with the patient including greater than 50% of time in face to face counsel of the patient re her  oral lesions, her immunodeficiency further workup and in coordination of her care.

## 2020-12-31 LAB — CBC WITH DIFFERENTIAL/PLATELET
Absolute Monocytes: 109 cells/uL — ABNORMAL LOW (ref 200–950)
Basophils Absolute: 10 cells/uL (ref 0–200)
Basophils Relative: 0.3 %
Eosinophils Absolute: 162 cells/uL (ref 15–500)
Eosinophils Relative: 4.9 %
HCT: 33.2 % — ABNORMAL LOW (ref 35.0–45.0)
Hemoglobin: 11.3 g/dL — ABNORMAL LOW (ref 11.7–15.5)
Lymphs Abs: 1525 cells/uL (ref 850–3900)
MCH: 33.5 pg — ABNORMAL HIGH (ref 27.0–33.0)
MCHC: 34 g/dL (ref 32.0–36.0)
MCV: 98.5 fL (ref 80.0–100.0)
MPV: 10 fL (ref 7.5–12.5)
Monocytes Relative: 3.3 %
Neutro Abs: 1495 cells/uL — ABNORMAL LOW (ref 1500–7800)
Neutrophils Relative %: 45.3 %
Platelets: 186 10*3/uL (ref 140–400)
RBC: 3.37 10*6/uL — ABNORMAL LOW (ref 3.80–5.10)
RDW: 14.9 % (ref 11.0–15.0)
Total Lymphocyte: 46.2 %
WBC: 3.3 10*3/uL — ABNORMAL LOW (ref 3.8–10.8)

## 2020-12-31 LAB — COMPLETE METABOLIC PANEL WITH GFR
AG Ratio: 1.2 (calc) (ref 1.0–2.5)
ALT: 20 U/L (ref 6–29)
AST: 33 U/L (ref 10–35)
Albumin: 3.7 g/dL (ref 3.6–5.1)
Alkaline phosphatase (APISO): 62 U/L (ref 31–125)
BUN: 9 mg/dL (ref 7–25)
CO2: 25 mmol/L (ref 20–32)
Calcium: 9.4 mg/dL (ref 8.6–10.2)
Chloride: 108 mmol/L (ref 98–110)
Creat: 0.83 mg/dL (ref 0.50–1.10)
GFR, Est African American: 98 mL/min/{1.73_m2} (ref >=60)
GFR, Est Non African American: 85 mL/min/{1.73_m2} (ref >=60)
Globulin: 3.2 g/dL (calc) (ref 1.9–3.7)
Glucose, Bld: 73 mg/dL (ref 65–99)
Potassium: 4.3 mmol/L (ref 3.5–5.3)
Sodium: 140 mmol/L (ref 135–146)
Total Bilirubin: 0.4 mg/dL (ref 0.2–1.2)
Total Protein: 6.9 g/dL (ref 6.1–8.1)

## 2020-12-31 LAB — C-REACTIVE PROTEIN: CRP: 1.4 mg/L (ref <8.0)

## 2020-12-31 LAB — SEDIMENTATION RATE: Sed Rate: 14 mm/h (ref 0–20)

## 2021-01-04 ENCOUNTER — Ambulatory Visit: Payer: Managed Care, Other (non HMO) | Admitting: Allergy

## 2021-01-04 ENCOUNTER — Telehealth: Payer: Self-pay | Admitting: Infectious Disease

## 2021-01-04 DIAGNOSIS — D803 Selective deficiency of immunoglobulin G [IgG] subclasses: Secondary | ICD-10-CM

## 2021-01-04 NOTE — Telephone Encounter (Signed)
I corresponded with Dr. Cassandria Santee via email  He thought trial off of the Methotrexate made sense since pt has not had this done and lesions came up while she has been on this therapy  He recommended Achilles Dunk for Immunology workup at [facility] which I will put in though it stopping MTX works this may also help her out faster

## 2021-01-04 NOTE — Progress Notes (Deleted)
Follow Up Note  RE: [name] Horn MRN: [id number] DOB: July 02, 1976 Date of Office Visit: 01/04/2021  Referring provider: Alan Ripper, PA Primary care provider: Alan Ripper, [place]  Chief Complaint: No chief complaint on file.  History of Present Illness: I had the pleasure of seeing [name] Horn for a follow up visit at the [facility] [place] on 01/04/2021. She is a 45 y.o. female, who is being followed for specific antibody deficiency, EOE and chronic rhinitis. Her previous allergy office visit was on 06/24/2020 with Dr. Maudie Mercury. Today is a regular follow up visit.  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 Initially thought to be due to her swallowed Flovent used for EoE. However, she stopped using Flovent in January and had multiple reoccurrences since then. Treated with oral fluconazole and nystatin wash which help while on the medication. Recently diagnosed with RA and started on methotrexate. Having some episodes of fevers with these thrush. History of C diff x 3, shingles, pneumonia x 1, strep throat s/p tonsillectomy, poor dentitions. 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  Normal immunoglobulin levels in 2020. Normal IgG, IgA, IgM, IgE, CH50, CD19, CD3, CD4, CD8, NK cells, good tetanus and diptheria titers; nonreactive to HIV and hepatitis panel. Poor pneumococcal titers despite both pneumovax and prevnar vaccine within the past 6 months. Normal neutrophil oxd burst test. Interim history - evaluated by ID and recommended to start IVIG.   Follow up with ID as scheduled and continue recommendations as per them.   Keep track of infections.  If no improvement, may benefit from immunology consultation at a tertiary/academic center.  Eosinophilic esophagitis Past history - Diagnosed with  EoE meany years ago and was on Flovent for 3 years. Patient had dilatations in the past. 2021 food panel was negative.  Interim history - tried to eliminate dairy but having difficultly as that is one time that she can tolerate with her other GI issues. No EGD per GI yet. Swallowed steroids were stopped due to recurrent thrush.  Follow up with GI - continue recommendations per them including continue PPI.  Maybe worthwhile to revisit elimination diet once the thrush has resolved.   Chronic rhinitis Past history - Mild rhino conjunctivitis symptoms in the fall. 2021 testing negative to environmental allergies.  Interim history - stable with no meds.  Return in about 6 months (around 12/24/2020).  Assessment and Plan: [name] Horn is a 45 y.o. female with: No problem-specific Assessment & Plan notes found for this encounter.  No follow-ups on file.  No orders of the defined types were placed in this encounter.  Lab Orders  No laboratory test(s) ordered today    Diagnostics: Spirometry:  Tracings reviewed. Her effort: {Blank single:19197::"Good reproducible efforts.","It was hard to get consistent efforts and there is a question as to whether this reflects a maximal maneuver.","Poor effort, data can not be interpreted."} FVC: ***L FEV1: ***L, ***% predicted FEV1/FVC ratio: ***% Interpretation: {Blank single:19197::"Spirometry consistent with mild obstructive disease","Spirometry consistent with moderate obstructive disease","Spirometry consistent with severe obstructive disease","Spirometry consistent with possible restrictive disease","Spirometry consistent with mixed obstructive and restrictive disease","Spirometry uninterpretable due to technique","Spirometry consistent with normal pattern","No overt abnormalities noted given today's efforts"}.  Please see scanned spirometry results for details.  Skin Testing: {Blank single:19197::"Select foods","Environmental allergy  panel","Environmental allergy panel and select foods","Food allergy panel","None","Deferred due to  recent antihistamines use"}. Positive test to: ***. Negative test to: ***.  Results discussed with patient/family.   Medication List:  Current Outpatient Medications  Medication Sig Dispense Refill  . acyclovir (ZOVIRAX) 400 MG tablet Take 400 mg by mouth 3 (three) times daily.     Marland Kitchen amphetamine-dextroamphetamine (ADDERALL) 20 MG tablet Take 20 mg by mouth 3 (three) times daily.    . ANTACID 200-200-20 MG/5ML suspension Take by mouth.    Marland Kitchen atropine 1 % ophthalmic solution Place 1 drop into the left eye daily as needed (for dry eye).    . baclofen (LIORESAL) 10 MG tablet Take 10 mg by mouth daily.    . Bepotastine Besilate 1.5 % SOLN Place 1 drop into both eyes 2 (two) times a day.    . cycloSPORINE (RESTASIS) 0.05 % ophthalmic emulsion Place 1 drop into both eyes 2 (two) times daily.    Marland Kitchen dicyclomine (BENTYL) 10 MG capsule Take 10 mg by mouth daily as needed for nausea/vomiting.    . fluconazole (DIFLUCAN) 200 MG tablet Take 1 tablet (200 mg total) by mouth daily. 30 tablet 1  . folic acid (FOLVITE) 1 MG tablet Take 2 tablets (2 mg total) by mouth daily. 180 tablet 3  . furosemide (LASIX) 20 MG tablet Take 20 mg by mouth daily as needed.     Marland Kitchen LORazepam (ATIVAN) 1 MG tablet Take 1 mg by mouth 3 (three) times daily as needed.    . methotrexate 50 MG/2ML injection Inject 0.72mL into the skin once weekly. 10 mL 0  . metoCLOPramide (REGLAN) 10 MG tablet Take 10 mg by mouth daily.     . metoprolol succinate (TOPROL-XL) 25 MG 24 hr tablet Take 25 mg by mouth daily.    Marland Kitchen nystatin (MYCOSTATIN) 100000 UNIT/ML suspension Take 10 mLs (1,000,000 Units total) by mouth 3 (three) times daily. 60 mL 2  . ondansetron (ZOFRAN-ODT) 4 MG disintegrating tablet Take 1 tablet (4 mg total) by mouth every 8 (eight) hours as needed for nausea or vomiting. 15 tablet 0  . oxyCODONE-acetaminophen (PERCOCET) 7.5-325 MG  tablet     . pantoprazole (PROTONIX) 40 MG tablet Take 1 tablet (40 mg total) by mouth 2 (two) times daily. 60 tablet 1  . Polyethyl Glycol-Propyl Glycol (SYSTANE) 0.4-0.3 % GEL ophthalmic gel Place 1 application into both eyes daily as needed (dryness).    . potassium chloride (K-DUR) 10 MEQ tablet Take 10 mEq by mouth daily as needed.     . pravastatin (PRAVACHOL) 40 MG tablet Take 40 mg by mouth daily.    . prednisoLONE acetate (PRED FORTE) 1 % ophthalmic suspension Place 1 drop into the left eye 2 (two) times daily.    . promethazine (PHENERGAN) 25 MG suppository Place 1 suppository (25 mg total) rectally every 6 (six) hours as needed for nausea or vomiting. 12 each 0  . SUMAtriptan (IMITREX) 25 MG tablet Take 25 mg by mouth every 2 (two) hours as needed for migraine. May repeat in 2 hours if headache persists or recurs.    . topiramate (TOPAMAX) 100 MG tablet Take 100 mg by mouth daily.    Marland Kitchen topiramate (TOPAMAX) 25 MG tablet Take 25 mg by mouth at bedtime.     . traZODone (DESYREL) 150 MG tablet Take 150 mg by mouth at bedtime as needed for sleep.    Marland Kitchen triamcinolone lotion (KENALOG) 0.1 % APPLY TOPICALLY TO SCALP AT BEDTIME AS DIRECTED    . TUBERCULIN SYR 1CC/27GX1/2" (B-D TB SYRINGE  1CC/27GX1/2") 27G X 1/2" 1 ML MISC 12 Syringes by Does not apply route once a week. 12 each 3  . valACYclovir (VALTREX) 1000 MG tablet Take 1 tablet (1,000 mg total) by mouth daily. 30 tablet 5   No current facility-administered medications for this visit.   Allergies: Allergies  Allergen Reactions  . Other     Dermabond surgical glue.   . Sulfa Antibiotics Anaphylaxis, Shortness Of Breath, Swelling and Rash    Angioedema (also)  . Sulfonamide Derivatives Hives, Shortness Of Breath and Swelling    TONGUE SWELLS  . Benadryl [Diphenhydramine Hcl] Other (See Comments)    Hyperactivity    I reviewed her past medical history, social history, family history, and environmental history and no significant  changes have been reported from her previous visit.  Review of Systems  Constitutional: Negative for appetite change, chills, fever and unexpected weight change.  HENT: Positive for sore throat and voice change. Negative for congestion and rhinorrhea.   Eyes: Negative for itching.  Respiratory: Negative for cough, chest tightness, shortness of breath and wheezing.   Cardiovascular: Negative for chest pain.  Gastrointestinal: Positive for abdominal pain.  Genitourinary: Negative for difficulty urinating.  Skin: Negative for rash.  Allergic/Immunologic: Negative for environmental allergies and food allergies.  Neurological: Negative for headaches.   Objective: There were no vitals taken for this visit. There is no height or weight on file to calculate BMI. Physical Exam Vitals and nursing note reviewed.  Constitutional:      Appearance: Normal appearance. She is well-developed.  HENT:     Head: Normocephalic and atraumatic.     Right Ear: Tympanic membrane and external ear normal.     Left Ear: Tympanic membrane and external ear normal.     Nose: Nose normal.     Mouth/Throat:     Mouth: Mucous membranes are moist.     Pharynx: Oropharynx is clear.  Eyes:     Conjunctiva/sclera: Conjunctivae normal.  Cardiovascular:     Rate and Rhythm: Normal rate and regular rhythm.     Heart sounds: Normal heart sounds. No murmur heard.   Pulmonary:     Effort: Pulmonary effort is normal.     Breath sounds: Normal breath sounds. No wheezing, rhonchi or rales.  Musculoskeletal:     Cervical back: Neck supple.  Skin:    General: Skin is warm.     Findings: No rash.  Neurological:     Mental Status: She is alert and oriented to person, place, and time.  Psychiatric:        Behavior: Behavior normal.    Previous notes and tests were reviewed. The plan was reviewed with the patient/family, and all questions/concerned were addressed.  It was my pleasure to see [name] Horn today and  participate in her care. Please feel free to contact me with any questions or concerns.  Sincerely,  Rexene Alberts, DO Allergy & Immunology  [facility] office: [place] office: [phone]

## 2021-01-04 NOTE — Telephone Encounter (Signed)
I think the prednisone is ok. I am wondering if the MTX might be causing the oral lesions and we just got caught up in idea of it being thrush. I have had plenty of ppl with long standing thrush with immunodeficiency syndromes but they get better (at least temporarily) and if they are super compromised their candida gets  R none of this fits well for her.

## 2021-01-04 NOTE — Telephone Encounter (Signed)
Thank you for the update.  If she has a flare of rheumatoid arthritis should be used prednisone while she is off methotrexate?  I have not seen her in several months.  I hope her rheumatoid arthritis is well controlled.  Thank you for referring her to [facility] immunology. Abel Presto

## 2021-01-05 NOTE — Telephone Encounter (Signed)
We can definitely switch her to some other DMARDs or biologic agent if needed.

## 2021-01-05 NOTE — Telephone Encounter (Signed)
Lets see how she does just off the MTX first and if her tongue problems and buccal mucosa problems go away that will be great

## 2021-01-06 NOTE — Telephone Encounter (Signed)
Thanks so much. 

## 2021-01-12 ENCOUNTER — Telehealth: Payer: Self-pay | Admitting: *Deleted

## 2021-01-12 ENCOUNTER — Encounter: Payer: Self-pay | Admitting: Rheumatology

## 2021-01-12 ENCOUNTER — Ambulatory Visit (INDEPENDENT_AMBULATORY_CARE_PROVIDER_SITE_OTHER): Payer: 59 | Admitting: Rheumatology

## 2021-01-12 ENCOUNTER — Other Ambulatory Visit: Payer: Self-pay

## 2021-01-12 VITALS — BP 109/74 | HR 73 | Resp 15 | Ht 64.0 in | Wt 164.4 lb

## 2021-01-12 DIAGNOSIS — M0579 Rheumatoid arthritis with rheumatoid factor of multiple sites without organ or systems involvement: Secondary | ICD-10-CM | POA: Diagnosis not present

## 2021-01-12 DIAGNOSIS — Z8542 Personal history of malignant neoplasm of other parts of uterus: Secondary | ICD-10-CM

## 2021-01-12 DIAGNOSIS — M3501 Sicca syndrome with keratoconjunctivitis: Secondary | ICD-10-CM

## 2021-01-12 DIAGNOSIS — K141 Geographic tongue: Secondary | ICD-10-CM

## 2021-01-12 DIAGNOSIS — Z79899 Other long term (current) drug therapy: Secondary | ICD-10-CM

## 2021-01-12 DIAGNOSIS — Z8659 Personal history of other mental and behavioral disorders: Secondary | ICD-10-CM

## 2021-01-12 DIAGNOSIS — K200 Eosinophilic esophagitis: Secondary | ICD-10-CM

## 2021-01-12 DIAGNOSIS — M7918 Myalgia, other site: Secondary | ICD-10-CM

## 2021-01-12 DIAGNOSIS — D808 Other immunodeficiencies with predominantly antibody defects: Secondary | ICD-10-CM

## 2021-01-12 DIAGNOSIS — G5601 Carpal tunnel syndrome, right upper limb: Secondary | ICD-10-CM | POA: Diagnosis not present

## 2021-01-12 DIAGNOSIS — Z8619 Personal history of other infectious and parasitic diseases: Secondary | ICD-10-CM

## 2021-01-12 DIAGNOSIS — M5030 Other cervical disc degeneration, unspecified cervical region: Secondary | ICD-10-CM

## 2021-01-12 DIAGNOSIS — K3184 Gastroparesis: Secondary | ICD-10-CM

## 2021-01-12 DIAGNOSIS — I7300 Raynaud's syndrome without gangrene: Secondary | ICD-10-CM

## 2021-01-12 DIAGNOSIS — B370 Candidal stomatitis: Secondary | ICD-10-CM

## 2021-01-12 NOTE — Telephone Encounter (Signed)
She has been on fluconqzole plenty long and yes please have her stop as well but eat her take it 5 days poststopping mtx

## 2021-01-12 NOTE — Telephone Encounter (Signed)
Patient received message about stopping methotrexate. She wants to know if she needs to stop fluconazole 200 mg daily as well. Please advise. Landis Gandy, RN

## 2021-01-13 NOTE — Telephone Encounter (Signed)
Relayed to patient. Thanks! Landis Gandy, RN

## 2021-01-13 NOTE — Telephone Encounter (Signed)
Thanks Michelle

## 2021-01-21 ENCOUNTER — Encounter (HOSPITAL_COMMUNITY)
Admission: RE | Admit: 2021-01-21 | Discharge: 2021-01-21 | Disposition: A | Discharge status: Home / Self Care | Payer: 59 | Source: Ambulatory Visit | Attending: Infectious Disease | Admitting: Infectious Disease

## 2021-01-21 DIAGNOSIS — D808 Other immunodeficiencies with predominantly antibody defects: Secondary | ICD-10-CM | POA: Insufficient documentation

## 2021-01-21 MED ORDER — IMMUNE GLOBULIN (HUMAN) 10 GM/100ML IV SOLN
30.0000 g | INTRAVENOUS | Status: DC
  Filled 2021-01-21: qty 300

## 2021-01-21 MED ORDER — IMMUNE GLOBULIN (HUMAN) 10 GM/100ML IV SOLN
30.0000 g | INTRAVENOUS | Status: DC
Start: 2021-01-21 — End: 2021-01-22
  Administered 2021-01-21: 30 g via INTRAVENOUS
  Filled 2021-01-21: qty 100

## 2021-02-01 ENCOUNTER — Ambulatory Visit: Payer: 59 | Admitting: Allergy

## 2021-02-01 NOTE — Progress Notes (Deleted)
Follow Up Note  RE: [name] Horn MRN: [id number] DOB: May 23, 1976 Date of Office Visit: 02/01/2021  Referring provider: Alan Ripper, PA Primary care provider: Alan Ripper, [place]  Chief Complaint: No chief complaint on file.  History of Present Illness: I had the pleasure of seeing [name] Horn for a follow up visit at the [facility] [place] [facility] [place] on 02/01/2021. She is a 45 y.o. female, who is being followed for specific antibody deficiency with normal immunoglobulin, EOE and chronic rhinitis. Her previous allergy office visit was on June 24, 2020 with Dr. Maudie Mercury. Today is a regular follow up visit.  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 Initially thought to be due to her swallowed Flovent used for EoE. However, she stopped using Flovent in January and had multiple reoccurrences since then. Treated with oral fluconazole and nystatin wash which help while on the medication. Recently diagnosed with RA and started on methotrexate. Having some episodes of fevers with these thrush. History of C diff x 3, shingles, pneumonia x 1, strep throat s/p tonsillectomy, poor dentitions. History of endometrial sarcoma in her 46s requiring radiation x 2.  Normal immunoglobulin levels in 2020. Normal IgG, IgA, IgM, IgE, CH50, CD19, CD3, CD4, CD8, NK cells, good tetanus and diptheria titers; nonreactive to HIV and hepatitis panel. Poor pneumococcal titers despite both pneumovax and prevnar vaccine within the past 6 months. Normal neutrophil oxd burst test. Interim history - evaluated by ID and recommended to start IVIG.   Follow up with ID as scheduled and continue recommendations as per them.   Keep track of infections.  If no improvement, may benefit from immunology consultation at a tertiary/academic center.  Eosinophilic  esophagitis Past history - Diagnosed with EoE meany years ago and was on Flovent for 3 years. Patient had dilatations in the past. 2021 food panel was negative.  Interim history - tried to eliminate dairy but having difficultly as that is one time that she can tolerate with her other GI issues. No EGD per GI yet. Swallowed steroids were stopped due to recurrent thrush.  Follow up with GI - continue recommendations per them including continue PPI.  Maybe worthwhile to revisit elimination diet once the thrush has resolved.   Chronic rhinitis Past history - Mild rhino conjunctivitis symptoms in the fall. 2021 testing negative to environmental allergies.  Interim history - stable with no meds.  Return in about 6 months (around 12/24/2020).  Assessment and Plan: [name] Horn is a 45 y.o. female with: No problem-specific Assessment & Plan notes found for this encounter.  No follow-ups on file.  No orders of the defined types were placed in this encounter.  Lab Orders  No laboratory test(s) ordered today    Diagnostics: Spirometry:  Tracings reviewed. Her effort: {Blank single:19197::"Good reproducible efforts.","It was hard to get consistent efforts and there is a question as to whether this reflects a maximal maneuver.","Poor effort, data can not be interpreted."} FVC: ***L FEV1: ***L, ***% predicted FEV1/FVC ratio: ***% Interpretation: {Blank single:19197::"Spirometry consistent with mild obstructive disease","Spirometry consistent with moderate obstructive disease","Spirometry consistent with severe obstructive disease","Spirometry consistent with possible restrictive disease","Spirometry consistent with mixed obstructive and restrictive disease","Spirometry uninterpretable due to technique","Spirometry consistent with normal pattern","No overt abnormalities noted given today's efforts"}.  Please see scanned spirometry results for details.  Skin Testing: {Blank single:19197::"Select  foods","Environmental allergy panel","Environmental allergy panel and select  foods","Food allergy panel","None","Deferred due to recent antihistamines use"}. Positive test to: ***. Negative test to: ***.  Results discussed with patient/family.   Medication List:  Current Outpatient Medications  Medication Sig Dispense Refill  . amphetamine-dextroamphetamine (ADDERALL) 20 MG tablet Take 20 mg by mouth 3 (three) times daily.    . ANTACID 200-200-20 MG/5ML suspension Take by mouth.    Marland Kitchen atropine 1 % ophthalmic solution Place 1 drop into the left eye daily as needed (for dry eye).    . baclofen (LIORESAL) 10 MG tablet Take 10 mg by mouth daily.    . Bepotastine Besilate 1.5 % SOLN Place 1 drop into both eyes 2 (two) times a day.    . cycloSPORINE (RESTASIS) 0.05 % ophthalmic emulsion Place 1 drop into both eyes 2 (two) times daily.    Marland Kitchen dicyclomine (BENTYL) 10 MG capsule Take 10 mg by mouth daily as needed for nausea/vomiting.    . fluconazole (DIFLUCAN) 200 MG tablet Take 1 tablet (200 mg total) by mouth daily. 30 tablet 1  . folic acid (FOLVITE) 1 MG tablet Take 2 tablets (2 mg total) by mouth daily. 180 tablet 3  . furosemide (LASIX) 20 MG tablet Take 20 mg by mouth daily as needed.     Marland Kitchen LORazepam (ATIVAN) 1 MG tablet Take 1 mg by mouth 3 (three) times daily as needed.    . metoCLOPramide (REGLAN) 10 MG tablet Take 10 mg by mouth daily.     . metoprolol succinate (TOPROL-XL) 25 MG 24 hr tablet Take 25 mg by mouth daily.    Marland Kitchen nystatin (MYCOSTATIN) 100000 UNIT/ML suspension Take 10 mLs (1,000,000 Units total) by mouth 3 (three) times daily. 60 mL 2  . ondansetron (ZOFRAN-ODT) 4 MG disintegrating tablet Take 1 tablet (4 mg total) by mouth every 8 (eight) hours as needed for nausea or vomiting. 15 tablet 0  . oxyCODONE-acetaminophen (PERCOCET) 7.5-325 MG tablet as needed.    . pantoprazole (PROTONIX) 40 MG tablet Take 1 tablet (40 mg total) by mouth 2 (two) times daily. 60 tablet 1  .  Phenazopyridine HCl (PYRIDIUM PO) Take by mouth as needed.    Vladimir Faster Glycol-Propyl Glycol (SYSTANE) 0.4-0.3 % GEL ophthalmic gel Place 1 application into both eyes daily as needed (dryness).    . potassium chloride (K-DUR) 10 MEQ tablet Take 10 mEq by mouth daily as needed.     . pravastatin (PRAVACHOL) 40 MG tablet Take 40 mg by mouth daily.    . prednisoLONE acetate (PRED FORTE) 1 % ophthalmic suspension Place 1 drop into the left eye 2 (two) times daily.    . promethazine (PHENERGAN) 25 MG suppository Place 1 suppository (25 mg total) rectally every 6 (six) hours as needed for nausea or vomiting. 12 each 0  . Promethazine HCl (PHENERGAN PO) Take by mouth as needed.    . SUMAtriptan (IMITREX) 25 MG tablet Take 25 mg by mouth every 2 (two) hours as needed for migraine. May repeat in 2 hours if headache persists or recurs.    . topiramate (TOPAMAX) 100 MG tablet Take 100 mg by mouth daily.    Marland Kitchen topiramate (TOPAMAX) 25 MG tablet Take 25 mg by mouth at bedtime.     . traZODone (DESYREL) 150 MG tablet Take 150 mg by mouth at bedtime as needed for sleep.    Marland Kitchen triamcinolone lotion (KENALOG) 0.1 % APPLY TOPICALLY TO SCALP AT BEDTIME AS DIRECTED    . valACYclovir (VALTREX) 1000 MG tablet Take 1 tablet (1,000  mg total) by mouth daily. 30 tablet 5   No current facility-administered medications for this visit.   Allergies: Allergies  Allergen Reactions  . Other     Dermabond surgical glue.   . Sulfa Antibiotics Anaphylaxis, Shortness Of Breath, Swelling and Rash    Angioedema (also)  . Sulfonamide Derivatives Hives, Shortness Of Breath and Swelling    TONGUE SWELLS  . Benadryl [Diphenhydramine Hcl] Other (See Comments)    Hyperactivity    I reviewed her past medical history, social history, family history, and environmental history and no significant changes have been reported from her previous visit.  Review of Systems  Constitutional: Negative for appetite change, chills, fever and  unexpected weight change.  HENT: Positive for sore throat and voice change. Negative for congestion and rhinorrhea.   Eyes: Negative for itching.  Respiratory: Negative for cough, chest tightness, shortness of breath and wheezing.   Cardiovascular: Negative for chest pain.  Gastrointestinal: Positive for abdominal pain.  Genitourinary: Negative for difficulty urinating.  Skin: Negative for rash.  Allergic/Immunologic: Negative for environmental allergies and food allergies.  Neurological: Negative for headaches.   Objective: There were no vitals taken for this visit. There is no height or weight on file to calculate BMI. Physical Exam Vitals and nursing note reviewed.  Constitutional:      Appearance: Normal appearance. She is well-developed.  HENT:     Head: Normocephalic and atraumatic.     Right Ear: Tympanic membrane and external ear normal.     Left Ear: Tympanic membrane and external ear normal.     Nose: Nose normal.     Mouth/Throat:     Mouth: Mucous membranes are moist.     Pharynx: Oropharynx is clear.  Eyes:     Conjunctiva/sclera: Conjunctivae normal.  Cardiovascular:     Rate and Rhythm: Normal rate and regular rhythm.     Heart sounds: Normal heart sounds. No murmur heard.   Pulmonary:     Effort: Pulmonary effort is normal.     Breath sounds: Normal breath sounds. No wheezing, rhonchi or rales.  Musculoskeletal:     Cervical back: Neck supple.  Skin:    General: Skin is warm.     Findings: No rash.  Neurological:     Mental Status: She is alert and oriented to person, place, and time.  Psychiatric:        Behavior: Behavior normal.    Previous notes and tests were reviewed. The plan was reviewed with the patient/family, and all questions/concerned were addressed.  It was my pleasure to see [name] Horn today and participate in her care. Please feel free to contact me with any questions or concerns.  Sincerely,  Rexene Alberts, DO Allergy &  Immunology  [facility] office: [place] office: [phone]

## 2021-02-10 NOTE — Progress Notes (Deleted)
Follow Up Note  RE: [name] Horn MRN: [id number] DOB: 05-08-1976 Date of Office Visit: 02/11/2021  Referring provider: Alan Ripper, PA Primary care provider: Alan Ripper, [place]  Chief Complaint: No chief complaint on file.  History of Present Illness: I had the pleasure of seeing [name] Horn for a follow up visit at the [facility] on 02/10/2021. She is a 45 y.o. female, who is being followed for specific antibody deficiency with normal immunoglobulin, EOE and chronic rhinitis. Her previous allergy office visit was on 06/24/2020 with Dr. Maudie Mercury. Today is a regular follow up visit.  Any recent IgG levels?  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 Initially thought to be due to her swallowed Flovent used for EoE. However, she stopped using Flovent in January and had multiple reoccurrences since then. Treated with oral fluconazole and nystatin wash which help while on the medication. Recently diagnosed with RA and started on methotrexate. Having some episodes of fevers with these thrush. History of C diff x 3, shingles, pneumonia x 1, strep throat s/p tonsillectomy, poor dentitions. 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  Normal immunoglobulin levels in 2020. Normal IgG, IgA, IgM, IgE, CH50, CD19, CD3, CD4, CD8, NK cells, good tetanus and diptheria titers; nonreactive to HIV and hepatitis panel. Poor pneumococcal titers despite both pneumovax and prevnar vaccine within the past 6 months. Normal neutrophil oxd burst test. Interim history - evaluated by ID and recommended to start IVIG.   Follow up with ID as scheduled and continue recommendations as per them.   Keep track of infections.  If no improvement, may benefit from immunology consultation at a tertiary/academic  center.  Eosinophilic esophagitis Past history - Diagnosed with EoE meany years ago and was on Flovent for 3 years. Patient had dilatations in the past. 2021 food panel was negative.  Interim history - tried to eliminate dairy but having difficultly as that is one time that she can tolerate with her other GI issues. No EGD per GI yet. Swallowed steroids were stopped due to recurrent thrush.  Follow up with GI - continue recommendations per them including continue PPI.  Maybe worthwhile to revisit elimination diet once the thrush has resolved.   Chronic rhinitis Past history - Mild rhino conjunctivitis symptoms in the fall. 2021 testing negative to environmental allergies.  Interim history - stable with no meds.  Return in about 6 months (around 12/24/2020).   Assessment and Plan: [name] Horn is a 45 y.o. female with: No problem-specific Assessment & Plan notes found for this encounter.  No follow-ups on file.  No orders of the defined types were placed in this encounter.  Lab Orders  No laboratory test(s) ordered today    Diagnostics: Spirometry:  Tracings reviewed. Her effort: {Blank single:19197::"Good reproducible efforts.","It was hard to get consistent efforts and there is a question as to whether this reflects a maximal maneuver.","Poor effort, data can not be interpreted."} FVC: ***L FEV1: ***L, ***% predicted FEV1/FVC ratio: ***% Interpretation: {Blank single:19197::"Spirometry consistent with mild obstructive disease","Spirometry consistent with moderate obstructive disease","Spirometry consistent with severe obstructive disease","Spirometry consistent with possible restrictive disease","Spirometry consistent with mixed obstructive and restrictive disease","Spirometry uninterpretable due to technique","Spirometry consistent with normal pattern","No overt abnormalities noted given today's efforts"}.  Please see scanned spirometry results for details.  Skin Testing:  {Blank single:19197::"Select foods","Environmental allergy panel","Environmental  allergy panel and select foods","Food allergy panel","None","Deferred due to recent antihistamines use"}. Positive test to: ***. Negative test to: ***.  Results discussed with patient/family.   Medication List:  Current Outpatient Medications  Medication Sig Dispense Refill  . amphetamine-dextroamphetamine (ADDERALL) 20 MG tablet Take 20 mg by mouth 3 (three) times daily.    . ANTACID 200-200-20 MG/5ML suspension Take by mouth.    Marland Kitchen atropine 1 % ophthalmic solution Place 1 drop into the left eye daily as needed (for dry eye).    . baclofen (LIORESAL) 10 MG tablet Take 10 mg by mouth daily.    . Bepotastine Besilate 1.5 % SOLN Place 1 drop into both eyes 2 (two) times a day.    . cycloSPORINE (RESTASIS) 0.05 % ophthalmic emulsion Place 1 drop into both eyes 2 (two) times daily.    Marland Kitchen dicyclomine (BENTYL) 10 MG capsule Take 10 mg by mouth daily as needed for nausea/vomiting.    . fluconazole (DIFLUCAN) 200 MG tablet Take 1 tablet (200 mg total) by mouth daily. 30 tablet 1  . folic acid (FOLVITE) 1 MG tablet Take 2 tablets (2 mg total) by mouth daily. 180 tablet 3  . furosemide (LASIX) 20 MG tablet Take 20 mg by mouth daily as needed.     Marland Kitchen LORazepam (ATIVAN) 1 MG tablet Take 1 mg by mouth 3 (three) times daily as needed.    . metoCLOPramide (REGLAN) 10 MG tablet Take 10 mg by mouth daily.     . metoprolol succinate (TOPROL-XL) 25 MG 24 hr tablet Take 25 mg by mouth daily.    Marland Kitchen nystatin (MYCOSTATIN) 100000 UNIT/ML suspension Take 10 mLs (1,000,000 Units total) by mouth 3 (three) times daily. 60 mL 2  . ondansetron (ZOFRAN-ODT) 4 MG disintegrating tablet Take 1 tablet (4 mg total) by mouth every 8 (eight) hours as needed for nausea or vomiting. 15 tablet 0  . oxyCODONE-acetaminophen (PERCOCET) 7.5-325 MG tablet as needed.    . pantoprazole (PROTONIX) 40 MG tablet Take 1 tablet (40 mg total) by mouth 2 (two) times  daily. 60 tablet 1  . Phenazopyridine HCl (PYRIDIUM PO) Take by mouth as needed.    Vladimir Faster Glycol-Propyl Glycol (SYSTANE) 0.4-0.3 % GEL ophthalmic gel Place 1 application into both eyes daily as needed (dryness).    . potassium chloride (K-DUR) 10 MEQ tablet Take 10 mEq by mouth daily as needed.     . pravastatin (PRAVACHOL) 40 MG tablet Take 40 mg by mouth daily.    . prednisoLONE acetate (PRED FORTE) 1 % ophthalmic suspension Place 1 drop into the left eye 2 (two) times daily.    . promethazine (PHENERGAN) 25 MG suppository Place 1 suppository (25 mg total) rectally every 6 (six) hours as needed for nausea or vomiting. 12 each 0  . Promethazine HCl (PHENERGAN PO) Take by mouth as needed.    . SUMAtriptan (IMITREX) 25 MG tablet Take 25 mg by mouth every 2 (two) hours as needed for migraine. May repeat in 2 hours if headache persists or recurs.    . topiramate (TOPAMAX) 100 MG tablet Take 100 mg by mouth daily.    Marland Kitchen topiramate (TOPAMAX) 25 MG tablet Take 25 mg by mouth at bedtime.     . traZODone (DESYREL) 150 MG tablet Take 150 mg by mouth at bedtime as needed for sleep.    Marland Kitchen triamcinolone lotion (KENALOG) 0.1 % APPLY TOPICALLY TO SCALP AT BEDTIME AS DIRECTED    . valACYclovir (VALTREX) 1000 MG tablet  Take 1 tablet (1,000 mg total) by mouth daily. 30 tablet 5   No current facility-administered medications for this visit.   Allergies: Allergies  Allergen Reactions  . Other     Dermabond surgical glue.   . Sulfa Antibiotics Anaphylaxis, Shortness Of Breath, Swelling and Rash    Angioedema (also)  . Sulfonamide Derivatives Hives, Shortness Of Breath and Swelling    TONGUE SWELLS  . Benadryl [Diphenhydramine Hcl] Other (See Comments)    Hyperactivity    I reviewed her past medical history, social history, family history, and environmental history and no significant changes have been reported from her previous visit.  Review of Systems  Constitutional: Negative for appetite change,  chills, fever and unexpected weight change.  HENT: Positive for sore throat and voice change. Negative for congestion and rhinorrhea.   Eyes: Negative for itching.  Respiratory: Negative for cough, chest tightness, shortness of breath and wheezing.   Cardiovascular: Negative for chest pain.  Gastrointestinal: Positive for abdominal pain.  Genitourinary: Negative for difficulty urinating.  Skin: Negative for rash.  Allergic/Immunologic: Negative for environmental allergies and food allergies.  Neurological: Negative for headaches.   Objective: There were no vitals taken for this visit. There is no height or weight on file to calculate BMI. Physical Exam Vitals and nursing note reviewed.  Constitutional:      Appearance: Normal appearance. She is well-developed.  HENT:     Head: Normocephalic and atraumatic.     Right Ear: Tympanic membrane and external ear normal.     Left Ear: Tympanic membrane and external ear normal.     Nose: Nose normal.     Mouth/Throat:     Mouth: Mucous membranes are moist.     Pharynx: Oropharynx is clear.  Eyes:     Conjunctiva/sclera: Conjunctivae normal.  Cardiovascular:     Rate and Rhythm: Normal rate and regular rhythm.     Heart sounds: Normal heart sounds. No murmur heard.   Pulmonary:     Effort: Pulmonary effort is normal.     Breath sounds: Normal breath sounds. No wheezing, rhonchi or rales.  Musculoskeletal:     Cervical back: Neck supple.  Skin:    General: Skin is warm.     Findings: No rash.  Neurological:     Mental Status: She is alert and oriented to person, place, and time.  Psychiatric:        Behavior: Behavior normal.    Previous notes and tests were reviewed. The plan was reviewed with the patient/family, and all questions/concerned were addressed.  It was my pleasure to see [name] Horn today and participate in her care. Please feel free to contact me with any questions or concerns.  Sincerely,  Rexene Alberts, DO Allergy &  Immunology  [facility] office: [place] office: [phone]

## 2021-02-11 ENCOUNTER — Telehealth: Payer: Self-pay | Admitting: Allergy

## 2021-02-11 ENCOUNTER — Ambulatory Visit: Payer: 59 | Admitting: Allergy

## 2021-02-11 DIAGNOSIS — K200 Eosinophilic esophagitis: Secondary | ICD-10-CM

## 2021-02-11 DIAGNOSIS — D808 Other immunodeficiencies with predominantly antibody defects: Secondary | ICD-10-CM

## 2021-02-11 DIAGNOSIS — J310 Chronic rhinitis: Secondary | ICD-10-CM

## 2021-02-11 NOTE — Telephone Encounter (Signed)
As long as she is being followed, she can come see me later or she can follow with [facility]. No need to see me anymore if they are managing all her issues.

## 2021-02-11 NOTE — Telephone Encounter (Signed)
Patient cancelled her appointment for today. She said she has been to see Dr. Tommy Medal at [facility]. He has referred her out to [facility] in [place] with Dr. Achilles Dunk. She wants to know if you need to see her before or after she goes to [facility]. She is waiting for them to call and schedule with her.

## 2021-02-11 NOTE — Telephone Encounter (Signed)
I will call her and let her know.

## 2021-02-18 ENCOUNTER — Ambulatory Visit (HOSPITAL_COMMUNITY)
Admission: RE | Admit: 2021-02-18 | Discharge: 2021-02-18 | Disposition: A | Discharge status: Home / Self Care | Payer: 59 | Source: Ambulatory Visit | Attending: Infectious Disease | Admitting: Infectious Disease

## 2021-02-18 DIAGNOSIS — D808 Other immunodeficiencies with predominantly antibody defects: Secondary | ICD-10-CM | POA: Insufficient documentation

## 2021-02-18 MED ORDER — IMMUNE GLOBULIN (HUMAN) 10 GM/100ML IV SOLN
30.0000 g | INTRAVENOUS | Status: DC
  Administered 2021-02-18: 30 g via INTRAVENOUS
  Filled 2021-02-18: qty 100

## 2021-03-08 ENCOUNTER — Telehealth: Payer: Self-pay

## 2021-03-08 NOTE — Telephone Encounter (Signed)
Patient left voicemail in triage wanting to make Dr. Tommy Medal aware that her oral thrush has returned, patient states PCP gave her Fluconazole. Patient also wanted to make MD aware that she will follow up with pulmonology on 4/6. Routing to MD to make aware.  [name] Horn

## 2021-03-09 NOTE — Telephone Encounter (Signed)
I remain very skeptical that this is actually thrush but it seems like this is first time in a while she has been treated

## 2021-03-18 ENCOUNTER — Encounter (HOSPITAL_COMMUNITY)
Admission: RE | Admit: 2021-03-18 | Discharge: 2021-03-18 | Disposition: A | Discharge status: Home / Self Care | Payer: 59 | Source: Ambulatory Visit | Attending: Infectious Disease | Admitting: Infectious Disease

## 2021-03-18 DIAGNOSIS — D808 Other immunodeficiencies with predominantly antibody defects: Secondary | ICD-10-CM | POA: Diagnosis present

## 2021-03-18 MED ORDER — IMMUNE GLOBULIN (HUMAN) 10 GM/100ML IV SOLN
30.0000 g | INTRAVENOUS | Status: DC
Start: 2021-03-20 — End: 2021-03-19
  Administered 2021-03-18: 30 g via INTRAVENOUS
  Filled 2021-03-18: qty 100

## 2021-03-22 ENCOUNTER — Ambulatory Visit: Payer: Managed Care, Other (non HMO) | Admitting: Infectious Disease

## 2021-04-01 NOTE — Progress Notes (Signed)
Office Visit Note  Patient: [name] Horn             Date of Birth: April 15, 1976           MRN: [id number]             PCP: Alan Ripper, [place] Referring: Alan Ripper, [place] Visit Date: 04/13/2021 Occupation: @GUAROCC @  Subjective:  Recurrent infections   History of Present Illness: [name] Horn is a 45 y.o. female with history of seropositive rheumatoid arthritis, myofascial pain, and sicca syndrome. She is not currently taking methotrexate or any other immunosuppressive agents for management of rheumatoid arthritis.  She is currently having pain in both hands and both wrist joints.  She denies any joint swelling but has been experiencing increased pitting edema in both legs.  Her morning stiffness has been lasting longer since discontinuing MTX.  She has also been experiencing intermittent pain in both shoulder joints and both feet. She has a history of recurrent infections and has been following along closely with Dr. Tommy Medal.  She continues to have recurrent bouts of thrush requiring treatment with fluconazole prescribed by her PCP.  She has been undergoing IVIG treatments every month. She continues to have bouts of fevers as often as every other week.  She was referred to [facility] immunology recently and sent for a second opinion to [facility] ID for further workup.  She has lab work pending and an upcoming appointment scheduled to discuss these results.  She states she has not been hospitalized in over 9 months which she is happy about.  She feels that she has noticed improvement since starting on IVIG treatment.  She is currently having a flare in her left eye that started this past weekend.  She applied atropine drops and continues to use systane and restasis drops as needed.  She has had less frequent flares over the past several months.  She continues to see Dr. Katy Fitch on a regular basis.    Activities of Daily Living:  Patient reports morning stiffness for 30-45 minutes.   Patient  Reports nocturnal pain.  Difficulty dressing/grooming: Denies Difficulty climbing stairs: Reports Difficulty getting out of chair: Denies Difficulty using hands for taps, buttons, cutlery, and/or writing: Reports  Review of Systems  Constitutional: Positive for fatigue.  HENT: Positive for mouth dryness and nose dryness. Negative for mouth sores.   Eyes: Positive for pain, visual disturbance and dryness.  Respiratory: Negative for shortness of breath and difficulty breathing.   Cardiovascular: Negative for chest pain and palpitations.  Gastrointestinal: Negative for blood in stool, constipation and diarrhea.  Endocrine: Negative for increased urination.  Genitourinary: Negative for difficulty urinating.  Musculoskeletal: Positive for arthralgias, joint pain, joint swelling, myalgias, morning stiffness, muscle tenderness and myalgias.  Skin: Positive for color change. Negative for rash and redness.  Allergic/Immunologic: Positive for susceptible to infections.  Neurological: Positive for numbness and headaches. Negative for dizziness and memory loss.  Hematological: Positive for bruising/bleeding tendency.  Psychiatric/Behavioral: Positive for sleep disturbance. Negative for confusion.    PMFS History:  Patient Active Problem List   Diagnosis Date Noted  . Fatigue 12/30/2020  . Malaise 12/30/2020  . FUO (fever of unknown origin) 10/05/2020  . Stomatitis 09/03/2020  . Rheumatoid arthritis ([place]) 08/12/2020  . Herpes labialis 08/12/2020  . Transaminitis 08/12/2020  . 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 Intertrigo 06/22/2020  . Interstitial cystitis 05/18/2020  . 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 Chronic rhinitis 04/23/2020  . Adverse food reaction 04/23/2020  . Eosinophilic esophagitis 58/52/7782  . Oral candidiasis 04/02/2020  . History of Clostridium difficile  infection 04/02/2020  . History of shingles 04/02/2020  . History of asthma 04/02/2020  . Multiple drug allergies 04/02/2020  . Environmental allergies 04/02/2020  . History of loop recorder 01/19/2020  . PVC (premature ventricular contraction) 01/19/2020  . Uterine cancer ([place]) 01/19/2020  . Colitis 08/14/2019  . Constipation   . Gastroparesis 04/03/2019  . Palpitations 04/10/2018  . Nausea and vomiting 04/26/2017  . Nausea & vomiting 04/25/2017  . Chest pain 04/25/2017  . Enterocolitis due to Clostridium difficile 01/04/2016  . Incisional hernia without obstruction or gangrene 11/18/2015  . Personal history of other diseases of the nervous system and sense organs 10/14/2015  . Disease of pancreas 01/21/2015  . Other specified bacterial agents as the cause of diseases classified elsewhere 01/21/2015  . Radiculopathy of cervical region 12/09/2014  . Annual physical exam 01/30/2012  . Melena 01/30/2012  . Pain in ankle 11/24/2010  . ANXIETY 10/11/2010  . DVT 10/11/2010  . COSTOCHONDRITIS 03/03/2008  . Cellulitis of toe 12/01/2007  . MENOPAUSE, PREMATURE 07/20/2007  . UTERINE CANCER, HX OF 07/16/2007  . Other postprocedural status(V45.89) 07/16/2007    Past Medical History:  Diagnosis Date  . Complication of anesthesia    " i TAKE A LIITLE BIT LONGER TO [place] UP "  . DVT (deep venous thrombosis) ([place])    x2  . Fatigue 12/30/2020  . Gastroparesis 08/07/2019  . 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 Herpes labialis 08/12/2020  . Intertrigo 06/22/2020  . Lymphedema   . Malaise 12/30/2020  . RA (rheumatoid arthritis) ([place])   . Rheumatoid arthritis ([place]) 08/12/2020  . Transaminitis 08/12/2020  . Uterine cancer ([place])     Family History  Problem Relation Age of Onset  . Hypertension Other   . Hyperlipidemia Other   . Colon cancer Other        grandmother  . Hashimoto's thyroiditis Mother   . Eczema Mother   . Angioedema Mother   . Throat cancer Father   . Arrhythmia Father   .  Angioedema Maternal Grandfather    Past Surgical History:  Procedure Laterality Date  . APPENDECTOMY    . cheek biopsy  11/2020  . CHOLECYSTECTOMY    . ESOPHAGOGASTRODUODENOSCOPY N/A 04/30/2017   Procedure: ESOPHAGOGASTRODUODENOSCOPY (EGD);  Surgeon: Teena Irani, MD;  Location: [facility] ENDOSCOPY;  Service: Endoscopy;  Laterality: N/A;  . HERNIA REPAIR     x2  . KNEE SURGERY     x3  . MINIMALLY INVASIVE FORAMINOTOMY CERVICAL SPINE     C6-T1, N[place]  . SAVORY DILATION N/A 04/30/2017   Procedure: SAVORY DILATION;  Surgeon: Teena Irani, MD;  Location: [facility] ENDOSCOPY;  Service: Endoscopy;  Laterality: N/A;  . TONSILLECTOMY    . TOTAL ABDOMINAL HYSTERECTOMY     Sarcoma, s/p XRT   Social History   Social History Narrative  . Not on file   Immunization History  Administered Date(s) Administered  . Influenza, Seasonal, Injecte, Preservative Fre 10/22/2014, 10/23/2015, 09/10/2018  . Influenza,inj,Quad PF,6+ Mos 11/21/2017, 09/10/2018  . Influenza-Unspecified 10/22/2014, 10/23/2015  . PFIZER(Purple Top)SARS-COV-2 Vaccination 12/27/2019, 02/24/2020, 09/09/2020, 03/09/2021  . Pneumococcal Conjugate-13 10/30/2019  . Tdap 04/19/2020     Objective: Vital Signs: BP 116/79 (BP Location: Left Arm, Patient Position: Sitting, Cuff Size: Normal)   Pulse 76   Resp 16   Ht 5\' 4"  (  1.626 m)   Wt 172 lb 12.8 oz (78.4 kg)   BMI 29.66 kg/m    Physical Exam Vitals and nursing note reviewed.  Constitutional:      Appearance: She is well-developed.  HENT:     Head: Normocephalic and atraumatic.  Eyes:     Conjunctiva/sclera: Conjunctivae normal.  Pulmonary:     Effort: Pulmonary effort is normal.  Abdominal:     Palpations: Abdomen is soft.  Musculoskeletal:     Cervical back: Normal range of motion.  Skin:    General: Skin is warm and dry.     Capillary Refill: Capillary refill takes less than 2 seconds.  Neurological:     Mental Status: She is alert and oriented to person, place, and time.   Psychiatric:        Behavior: Behavior normal.      Musculoskeletal Exam: C-spine limited ROM with lateral rotation.  Thoracic and lumbar spine good ROM.  Shoulder joints, elbow joints, wrist joints, MCPs, PIPs, and DIPs good ROM with no synovitis.  Complete fist formation bilaterally.  DIP prominence bilaterally.  Hip joints, knee joints, and ankle joints good ROM.  No warmth or effusion of knee joints.  No tenderness or joint swelling of ankle joints.  Pitting edema noted bilaterally.    CDAI Exam: CDAI Score: -- Patient Global: --; Provider Global: -- Swollen: --; Tender: -- Joint Exam 04/13/2021   No joint exam has been documented for this visit   There is currently no information documented on the homunculus. Go to the Rheumatology activity and complete the homunculus joint exam.  Investigation: No additional findings.  Imaging: No results found.  Recent Labs: Lab Results  Component Value Date   WBC 3.3 (L) 12/30/2020   HGB 11.3 (L) 12/30/2020   PLT 186 12/30/2020   NA 140 12/30/2020   K 4.3 12/30/2020   CL 108 12/30/2020   CO2 25 12/30/2020   GLUCOSE 73 12/30/2020   BUN 9 12/30/2020   CREATININE 0.83 12/30/2020   BILITOT 0.4 12/30/2020   ALKPHOS 71 01/26/2020   AST 33 12/30/2020   ALT 20 12/30/2020   PROT 6.9 12/30/2020   ALBUMIN 4.1 01/26/2020   CALCIUM 9.4 12/30/2020   GFRAA 98 12/30/2020   QFTBGOLDPLUS NEGATIVE 10/23/2019    Speciality Comments: No specialty comments available.  Procedures:  No procedures performed Allergies: Other, Sulfa antibiotics, Sulfonamide derivatives, and Benadryl [diphenhydramine hcl]   Assessment / Plan:     Visit Diagnoses: Rheumatoid arthritis involving multiple sites with positive rheumatoid factor ([place]): She has no synovitis on exam.  She continues to experience intermittent pain and stiffness in both hands, wrist joints, and both shoulder joints.  She has been holding methotrexate since January 2022 per recommendations  of Dr. Tommy Medal due to recurrent infections/oral lesions.  She is not on an immunosuppressive medications for the management of rheumatoid arthritis at this time.  She has continued to undergo a thorough workup by Dr. Tommy Medal and was recently referred to [facility]ology as well as sent for a second opinion at [facility] ID.  She has been receiving IVIG treatments every month but has not noticed any improvement in the oral ulcers.  Of note she has not been hospitalized in over 9 months since starting on IVIG.  She typically notices a mild improvement in her energy level after these infusions.  She will continue to follow up with [facility] immunology and ID.   We will hold off on restarting MTX  at this time.  She will require clearance in the future prior to restarting any immunosuppressive therapy.   She will follow up in 5 months.   High risk medication use -She will continue to hold Methotrexate until she has been cleared to restart or discuss other immunosuppressive agents.   Keratoconjunctivitis sicca (HCC) - Followed by Dr. Katy Fitch for recurrent conjunctivitis and keratoconjunctivitis.  AVISE index -2.4, all labs were negative except RF IgA 39. She is currently having a flare in the left eye which started about 3 days ago and is progressively worsening.  She has used atropine drops, systane, and restasis drops.  She plans on following up with Dr. Katy Fitch if her symptoms do not improve.   Carpal tunnel syndrome of right wrist: She experiences intermittent paresthesias and pain in the right hand due to underlying CTS.   Raynaud's syndrome without gangrene: She has intermittent symptoms of raynaud's.  No currently active. Good capillary refill on exam.  No signs or sclerodactyly noted.   Geographic tongue - And stomatitis.  Throat cultures negative for fungi and strep A on 04/02/21. She had no response to IVIG.  She will continue to follow up at [facility] immunology and Dr. Tommy Medal.  She was referred for a second opinion at  [facility] ID by Dr. Tommy Medal.   Eosinophilic esophagitis - Hx of dilatations. Responsive to oral prednisone.  DDD (degenerative disc disease), cervical: She has limited ROM of the C-spine with lateral rotation.    Myofascial pain: She experiences intermittent myalgias and muscle tenderness due to underlying myofascial pain.  Tender points noted on exam.   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 Tommy Medal.  History of recurrent/frequent infections.  The office visit note from 04/02/21 was reviewed today. IVIG treatments have had zero impact on recurrent oral lesions.  He obtained cultures at that visit on 04/02/21 which were negative for fungi and strep A. This supports his high suspicion for geographic tongue and stomatitis as the underlying etiology of the oral lesions. She will continue to follow up at [facility] immunology and was referred for a second opinion at [facility].  She will be following up with [facility] immunology to discuss additional lab work obtained on 03/31/21.  We will hold off on restart MTX or any other immunosuppressive agents until she has completed the workup and has been cleared to restart therapy.  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   UTERINE CANCER, HX OF  History of Clostridioides difficile colitis: Recurrent bouts in the past.  Gastroparesis  History of anxiety  Orders: No orders of the defined types were placed in this encounter.  No orders of the defined types were placed in this encounter.    Follow-Up Instructions: Return in about 5 months (around 09/13/2021) for Rheumatoid arthritis.   Ofilia Neas, PA-C  Note - This record has been created using Dragon software.  Chart creation errors have been sought, but may not always  have been located. Such creation errors do not reflect on  the standard of  medical care.

## 2021-04-02 ENCOUNTER — Encounter: Payer: Self-pay | Admitting: Infectious Disease

## 2021-04-02 ENCOUNTER — Ambulatory Visit (INDEPENDENT_AMBULATORY_CARE_PROVIDER_SITE_OTHER): Payer: 59 | Admitting: Infectious Disease

## 2021-04-02 ENCOUNTER — Other Ambulatory Visit: Payer: Self-pay

## 2021-04-02 VITALS — BP 113/80 | HR 76 | Temp 98.6°F | Wt 160.0 lb

## 2021-04-02 DIAGNOSIS — D808 Other immunodeficiencies with predominantly antibody defects: Secondary | ICD-10-CM | POA: Diagnosis not present

## 2021-04-02 DIAGNOSIS — K200 Eosinophilic esophagitis: Secondary | ICD-10-CM | POA: Diagnosis not present

## 2021-04-02 DIAGNOSIS — M05712 Rheumatoid arthritis with rheumatoid factor of left shoulder without organ or systems involvement: Secondary | ICD-10-CM | POA: Diagnosis not present

## 2021-04-02 DIAGNOSIS — K141 Geographic tongue: Secondary | ICD-10-CM | POA: Diagnosis not present

## 2021-04-02 NOTE — Progress Notes (Signed)
Subjective:   Chief complaint: She is concerned about mouth sores coming back again.    Patient ID: [name] Horn, female    DOB: [birth year], 45 y.o.   MRN: [id number]  HPI   45 year old Caucasian lady with hx of seropositive RA with keratoconjunctivitis sicca, and osteoarthritis.who  Started on MTX in November of  2020. She also  Has hx of eosinophilic esophagitis and was on fluticasone. She has had history of C difficile with 3 episodes. She  Had problems with recurrent strep throat and had tonsillectomy, pneumonia a few times, ear infection, tooth abscess. She  Has had  Shingles outbreak already. She then began to have thrush in January  which was attributed to her  Oral corticosteroid therapy but  Has persisted despite this being stopped.   She was taking nystatin swish and  Swallow and previously twice weekly fluconazole at 200 mg  While her  Overall immunoglobulin levels were normal she did not mount immune response to vaccination with pneumovax and prevnar with antibodies checked > 4 months after this that were low.  She was bitten by a cat and recently had tetanus vaccine in ER and antibiotics.  After 2 COVID vaccines she had not much of a response in terms of flu like  Symptoms etc that are typically experienced by patients with 2 dose mRNA based vaccines.   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  During one visit several visits ago she appeared to be suffering from intertrigo.  I increased her fluconazole 200 mg daily prophylactic dose and a higher dose  She then  continued to have abnormalities on her tongue but it was not clear to me that this is really thrush but could be something else such as geographic tongue for example.  I ended up taking her off of the fluconazole with desire to see how she did off of it.  She ended up developing stomatitis but missed her appointment  with Dr. Megan Salon was late again and then finally did see him he did a swab for fungal culture which is growing copious Candida albicans   Sensitivity data on her candida showed:  organism CANDIDA ALBICANS   Antimicrobial 1 FLUCONAZOLE   MIC 0.500 S   Antimicrobial 2 VORICONAZOLE   MIC <=0.008 S   Antimicrobial 3 POSACONAZOLE   MIC 0.030   Antimicrobial 4 CASPOFUNGIN   MIC 0.015 S   Comment: .    She remained on fluconazole at high dose since then and was seen by Terri Piedra in the interim.  Her tongue lesions had not resolved though she was telling me that her tongue was "completely pink in week prior to one visit.  Again I have felt that her story fits much better with geographic tongue then with candidiasis.  I wanted to observe her again off of antifungal therapy.   Since taking her off of the fluconazole week prior to visit her tongue actually looked improved.  She did  say she is having some soreness in the entire of her mouth.  I really have not thought this has anything to do with Candida though.   She was then OFF of azole therapy and saw Dr. Redmond Baseman who did perform a biopsy but not of the tongue but rather the buccal mucosa where she had more inflammation and possible thrush.  Biopsy came back without malignancy, hyperkeratosis with some  yeast as well  She had gone back on nystatin (and also fluconazole) though I had really only wanted her to start the former.  Tongue looked stable to improved to me and buccal mucosa resolved.  At my last visit I suggested stopping methotrexate to see if removing this medication would result in her not having oral lesions.  Since then she has had much less frequent outbreaks of these lesions on her tongue and inner buccal mucosa.  Also been seen [facility] by an allergist and immunologist there who is working her up for immunodeficiency.  She has remained on her IVIG which the immunologist at [facility] was aware of and  wanted her apparently to remain on it in the interim.  Tells me that she had again outbreak of lesions on her buccal mucosa and tongue and seen by PCP who prescribed fluconazole which she said led to rapid resolution of the lesions.  I continue to have skepticism and to voice skepticism that this is due to Candida.  She was told to come into my office or ENT to have a swab sent for fungal cultures if she was having the beginning of another outbreak.       Past Medical History:  Diagnosis Date  . Complication of anesthesia    " i TAKE A LIITLE BIT LONGER TO [place] UP "  . DVT (deep venous thrombosis) ([place])    x2  . Fatigue 12/30/2020  . Gastroparesis 08/07/2019  . 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 Herpes labialis 08/12/2020  . Intertrigo 06/22/2020  . Lymphedema   . Malaise 12/30/2020  . RA (rheumatoid arthritis) ([place])   . Rheumatoid arthritis ([place]) 08/12/2020  . Transaminitis 08/12/2020  . Uterine cancer [facility])     Past Surgical History:  Procedure Laterality Date  . APPENDECTOMY    . cheek biopsy  11/2020  . CHOLECYSTECTOMY    . ESOPHAGOGASTRODUODENOSCOPY N/A 04/30/2017   Procedure: ESOPHAGOGASTRODUODENOSCOPY (EGD);  Surgeon: Teena Irani, MD;  Location: [facility] ENDOSCOPY;  Service: Endoscopy;  Laterality: N/A;  . HERNIA REPAIR     x2  . KNEE SURGERY     x3  . MINIMALLY INVASIVE FORAMINOTOMY CERVICAL SPINE     C6-T1, Nitka  . SAVORY DILATION N/A 04/30/2017   Procedure: SAVORY DILATION;  Surgeon: Teena Irani, MD;  Location: [facility] ENDOSCOPY;  Service: Endoscopy;  Laterality: N/A;  . TONSILLECTOMY    . TOTAL ABDOMINAL HYSTERECTOMY     Sarcoma, s/p XRT    Family History  Problem Relation Age of Onset  . Hypertension Other   . Hyperlipidemia Other   . Colon cancer Other        grandmother  . Hashimoto's thyroiditis Mother   . Eczema Mother   . Angioedema Mother   . Throat cancer Father   . Arrhythmia Father   . Angioedema Maternal Grandfather       Social History    Socioeconomic History  . Marital status: Married    Spouse name: Not on file  . Number of children: Not on file  . Years of education: Not on file  . Highest education level: Not on file  Occupational History  . Not on file  Tobacco Use  . Smoking status: Passive Smoke Exposure - Never Smoker  . Smokeless tobacco: Never Used  Vaping Use  . Vaping Use: Never used  Substance and Sexual Activity  . Alcohol use: Not Currently  . Drug use: No  . Sexual activity: Yes  Partners: Male    Birth control/protection: None  Other Topics Concern  . Not on file  Social History Narrative  . Not on file   Social Determinants of Health   Financial Resource Strain: Not on file  Food Insecurity: Not on file  Transportation Needs: Not on file  Physical Activity: Not on file  Stress: Not on file  Social Connections: Not on file    Allergies  Allergen Reactions  . Other     Dermabond surgical glue.   . Sulfa Antibiotics Anaphylaxis, Shortness Of Breath, Swelling and Rash    Angioedema (also)  . Sulfonamide Derivatives Hives, Shortness Of Breath and Swelling    TONGUE SWELLS  . Benadryl [Diphenhydramine Hcl] Other (See Comments)    Hyperactivity      Current Outpatient Medications:  .  amphetamine-dextroamphetamine (ADDERALL) 20 MG tablet, Take 20 mg by mouth 3 (three) times daily., Disp: , Rfl:  .  ANTACID 200-200-20 MG/5ML suspension, Take by mouth., Disp: , Rfl:  .  atropine 1 % ophthalmic solution, Place 1 drop into the left eye daily as needed (for dry eye)., Disp: , Rfl:  .  baclofen (LIORESAL) 10 MG tablet, Take 10 mg by mouth daily., Disp: , Rfl:  .  Bepotastine Besilate 1.5 % SOLN, Place 1 drop into both eyes 2 (two) times a day., Disp: , Rfl:  .  cycloSPORINE (RESTASIS) 0.05 % ophthalmic emulsion, Place 1 drop into both eyes 2 (two) times daily., Disp: , Rfl:  .  dicyclomine (BENTYL) 10 MG capsule, Take 10 mg by mouth daily as needed for nausea/vomiting., Disp: , Rfl:   .  folic acid (FOLVITE) 1 MG tablet, Take 2 tablets (2 mg total) by mouth daily., Disp: 180 tablet, Rfl: 3 .  furosemide (LASIX) 20 MG tablet, Take 20 mg by mouth daily as needed. , Disp: , Rfl:  .  LORazepam (ATIVAN) 1 MG tablet, Take 1 mg by mouth 3 (three) times daily as needed., Disp: , Rfl:  .  metoCLOPramide (REGLAN) 10 MG tablet, Take 10 mg by mouth daily. , Disp: , Rfl:  .  metoprolol succinate (TOPROL-XL) 25 MG 24 hr tablet, Take 25 mg by mouth daily., Disp: , Rfl:  .  nystatin (MYCOSTATIN) 100000 UNIT/ML suspension, Take 10 mLs (1,000,000 Units total) by mouth 3 (three) times daily., Disp: 60 mL, Rfl: 2 .  ondansetron (ZOFRAN-ODT) 4 MG disintegrating tablet, Take 1 tablet (4 mg total) by mouth every 8 (eight) hours as needed for nausea or vomiting., Disp: 15 tablet, Rfl: 0 .  oxyCODONE-acetaminophen (PERCOCET) 7.5-325 MG tablet, as needed., Disp: , Rfl:  .  pantoprazole (PROTONIX) 40 MG tablet, Take 1 tablet (40 mg total) by mouth 2 (two) times daily., Disp: 60 tablet, Rfl: 1 .  Phenazopyridine HCl (PYRIDIUM PO), Take by mouth as needed., Disp: , Rfl:  .  Polyethyl Glycol-Propyl Glycol (SYSTANE) 0.4-0.3 % GEL ophthalmic gel, Place 1 application into both eyes daily as needed (dryness)., Disp: , Rfl:  .  potassium chloride (K-DUR) 10 MEQ tablet, Take 10 mEq by mouth daily as needed. , Disp: , Rfl:  .  pravastatin (PRAVACHOL) 40 MG tablet, Take 40 mg by mouth daily., Disp: , Rfl:  .  prednisoLONE acetate (PRED FORTE) 1 % ophthalmic suspension, Place 1 drop into the left eye 2 (two) times daily., Disp: , Rfl:  .  promethazine (PHENERGAN) 25 MG suppository, Place 1 suppository (25 mg total) rectally every 6 (six) hours as needed for nausea or vomiting.,  Disp: 12 each, Rfl: 0 .  Promethazine HCl (PHENERGAN PO), Take by mouth as needed., Disp: , Rfl:  .  SUMAtriptan (IMITREX) 25 MG tablet, Take 25 mg by mouth every 2 (two) hours as needed for migraine. May repeat in 2 hours if headache persists  or recurs., Disp: , Rfl:  .  topiramate (TOPAMAX) 100 MG tablet, Take 100 mg by mouth daily., Disp: , Rfl:  .  topiramate (TOPAMAX) 25 MG tablet, Take 25 mg by mouth at bedtime. , Disp: , Rfl:  .  traZODone (DESYREL) 150 MG tablet, Take 150 mg by mouth at bedtime as needed for sleep., Disp: , Rfl:  .  triamcinolone lotion (KENALOG) 0.1 %, APPLY TOPICALLY TO SCALP AT BEDTIME AS DIRECTED, Disp: , Rfl:  .  valACYclovir (VALTREX) 1000 MG tablet, Take 1 tablet (1,000 mg total) by mouth daily., Disp: 30 tablet, Rfl: 5 .  fluconazole (DIFLUCAN) 200 MG tablet, Take 1 tablet (200 mg total) by mouth daily. (Patient not taking: Reported on 04/02/2021), Disp: 30 tablet, Rfl: 1     Review of Systems  Constitutional: Positive for fatigue. Negative for activity change, appetite change, chills, diaphoresis, fever and unexpected weight change.  HENT: Positive for sore throat. Negative for congestion, rhinorrhea, sinus pressure, sneezing and trouble swallowing.   Eyes: Negative for photophobia and visual disturbance.  Respiratory: Negative for cough, chest tightness, wheezing and stridor.   Cardiovascular: Negative for chest pain and palpitations.  Gastrointestinal: Positive for diarrhea. Negative for abdominal distention, abdominal pain, anal bleeding, blood in stool, constipation, nausea and vomiting.  Genitourinary: Negative for difficulty urinating, dysuria, flank pain and hematuria.  Musculoskeletal: Negative for arthralgias, back pain, gait problem and joint swelling.  Skin: Negative for color change, pallor and wound.  Neurological: Negative for dizziness, tremors, weakness and light-headedness.  Hematological: Negative for adenopathy. Does not bruise/bleed easily.  Psychiatric/Behavioral: Negative for agitation, behavioral problems, confusion, decreased concentration, dysphoric mood and sleep disturbance.       Objective:   Physical Exam Constitutional:      General: She is not in acute distress.     Appearance: Normal appearance. She is well-developed. She is not ill-appearing or diaphoretic.  HENT:     Head: Normocephalic and atraumatic.     Right Ear: Hearing and external ear normal.     Left Ear: Hearing and external ear normal.     Nose: No nasal deformity or rhinorrhea.     Mouth/Throat:     Mouth: Mucous membranes are moist.     Pharynx: No posterior oropharyngeal erythema.  Eyes:     General: No scleral icterus.    Extraocular Movements: Extraocular movements intact.     Conjunctiva/sclera: Conjunctivae normal.     Right eye: Right conjunctiva is not injected.     Left eye: Left conjunctiva is not injected.  Neck:     Vascular: No JVD.  Cardiovascular:     Rate and Rhythm: Normal rate and regular rhythm.     Heart sounds: S1 normal and S2 normal. No murmur heard. No gallop.   Pulmonary:     Effort: Pulmonary effort is normal. No respiratory distress.     Breath sounds: No stridor. No wheezing or rhonchi.  Abdominal:     General: Abdomen is flat. There is no distension.     Palpations: Abdomen is soft. There is no mass.  Musculoskeletal:        General: Normal range of motion.     Right shoulder: Normal.  Left shoulder: Normal.     Cervical back: Normal range of motion and neck supple.     Right hip: Normal.     Left hip: Normal.     Right knee: Normal.     Left knee: Normal.  Lymphadenopathy:     Head:     Right side of head: No submandibular, preauricular or posterior auricular adenopathy.     Left side of head: No submandibular, preauricular or posterior auricular adenopathy.     Cervical: No cervical adenopathy.     Right cervical: No superficial or deep cervical adenopathy.    Left cervical: No superficial or deep cervical adenopathy.  Skin:    General: Skin is warm and dry.     Coloration: Skin is not pale.     Findings: No abrasion, bruising, ecchymosis, erythema, lesion or rash.     Nails: There is no clubbing.  Neurological:     General: No  focal deficit present.     Mental Status: She is alert and oriented to person, place, and time. Mental status is at baseline.     Sensory: No sensory deficit.     Coordination: Coordination normal.     Gait: Gait normal.  Psychiatric:        Attention and Perception: Attention and perception normal. She is attentive.        Mood and Affect: Mood is anxious.        Speech: Speech normal.        Behavior: Behavior normal. Behavior is cooperative.        Thought Content: Thought content normal.        Cognition and Memory: Cognition and memory normal.        Judgment: Judgment normal.    Op lesions from photograph she showed me several visits ago.    ]  Tongue  August 12, 2020:    Lips when seen by Dr. 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  Giving IVIG has made ZERO impact on her oral lesions  Stopping the methotrexate may have had an effect that she is only had 1 outbreak since stopping it but clearly if it was completely due to methotrexate should have resolved completely and not be recurring    #2 she has a different diagnosis such as geographic tongue + stomatitis that is NOT due to candida but which waxes and wanes with treatment  If #1 were true I would have expected her to be developing azole R at this point  She has seen [place] [facility] and allergy.  Work-up is underway there.  Her immunologist request I  will check cultures of the throat I will check bacterial cultures to see if there really is significant ease that over grows the bacteria though I  doubt that will happen.  Fungal cultures are undoubtedly going to grow Candida as I would expect to be the case with any person who we sampled her oropharynx with a selective media.  We will  again send Candida though for susceptibility testing.  I am also out of thoroughness going to refer her to infectious disease at [facility] to get a second opinion from a different clinic than ours.  I specifically put referral to Lise Auer, MD  If we cannot answer the questions here locally [place] I would vote for her to go up to [place] and be worked up there.  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

## 2021-04-13 ENCOUNTER — Ambulatory Visit (INDEPENDENT_AMBULATORY_CARE_PROVIDER_SITE_OTHER): Payer: 59 | Admitting: Physician Assistant

## 2021-04-13 ENCOUNTER — Other Ambulatory Visit: Payer: Self-pay

## 2021-04-13 ENCOUNTER — Encounter: Payer: Self-pay | Admitting: Physician Assistant

## 2021-04-13 VITALS — BP 116/79 | HR 76 | Resp 16 | Ht 64.0 in | Wt 172.8 lb

## 2021-04-13 DIAGNOSIS — Z79899 Other long term (current) drug therapy: Secondary | ICD-10-CM

## 2021-04-13 DIAGNOSIS — M3501 Sicca syndrome with keratoconjunctivitis: Secondary | ICD-10-CM

## 2021-04-13 DIAGNOSIS — Z8619 Personal history of other infectious and parasitic diseases: Secondary | ICD-10-CM

## 2021-04-13 DIAGNOSIS — Z8659 Personal history of other mental and behavioral disorders: Secondary | ICD-10-CM

## 2021-04-13 DIAGNOSIS — M5030 Other cervical disc degeneration, unspecified cervical region: Secondary | ICD-10-CM

## 2021-04-13 DIAGNOSIS — G5601 Carpal tunnel syndrome, right upper limb: Secondary | ICD-10-CM | POA: Diagnosis not present

## 2021-04-13 DIAGNOSIS — K3184 Gastroparesis: Secondary | ICD-10-CM

## 2021-04-13 DIAGNOSIS — M7918 Myalgia, other site: Secondary | ICD-10-CM

## 2021-04-13 DIAGNOSIS — K200 Eosinophilic esophagitis: Secondary | ICD-10-CM

## 2021-04-13 DIAGNOSIS — K141 Geographic tongue: Secondary | ICD-10-CM

## 2021-04-13 DIAGNOSIS — M0579 Rheumatoid arthritis with rheumatoid factor of multiple sites without organ or systems involvement: Secondary | ICD-10-CM | POA: Diagnosis not present

## 2021-04-13 DIAGNOSIS — D808 Other immunodeficiencies with predominantly antibody defects: Secondary | ICD-10-CM

## 2021-04-13 DIAGNOSIS — I7300 Raynaud's syndrome without gangrene: Secondary | ICD-10-CM

## 2021-04-13 DIAGNOSIS — Z8542 Personal history of malignant neoplasm of other parts of uterus: Secondary | ICD-10-CM

## 2021-04-14 ENCOUNTER — Telehealth: Payer: Self-pay

## 2021-04-14 LAB — FUNGUS CULTURE W SMEAR
MICRO NUMBER:: 11751530
SMEAR:: NONE SEEN
SPECIMEN QUALITY:: ADEQUATE

## 2021-04-14 LAB — CULTURE, GROUP A STREP
MICRO NUMBER:: 11751531
SPECIMEN QUALITY:: ADEQUATE

## 2021-04-14 NOTE — Telephone Encounter (Signed)
-----   Message from Truman Hayward, MD sent at 04/14/2021  3:56 PM EDT ----- She did not grow ANY fungus even on fungal culture reinforcing fact that this is not a recurrent fungal infection. She can let her immunologist at [facility] know

## 2021-04-14 NOTE — Telephone Encounter (Signed)
Relayed test results to patient. No questions or concerns at this time.   Billee Balcerzak Lorita Officer, RN

## 2021-04-21 DIAGNOSIS — D849 Immunodeficiency, unspecified: Secondary | ICD-10-CM | POA: Insufficient documentation

## 2021-04-22 ENCOUNTER — Inpatient Hospital Stay (HOSPITAL_COMMUNITY): Admission: RE | Admit: 2021-04-22 | Payer: 59 | Source: Ambulatory Visit

## 2021-04-22 ENCOUNTER — Telehealth: Payer: Self-pay

## 2021-04-22 NOTE — Telephone Encounter (Signed)
LMOM to schedule follow-up appointment to discuss [facility] results from Dr. Verlene Mayer with Dr. Estanislado Pandy or Lovena Le.

## 2021-04-22 NOTE — Telephone Encounter (Signed)
-----   Message from Shona Needles, RT sent at 04/21/2021  5:27 PM EDT ----- Please call patient to schedule f/u appt to discuss [facility] results from Dr. Verlene Mayer, with Dr. Estanislado Pandy or Lovena Le, first available. Thank you.

## 2021-04-26 ENCOUNTER — Ambulatory Visit: Payer: 59 | Admitting: Physician Assistant

## 2021-04-26 NOTE — Progress Notes (Deleted)
Office Visit Note  Patient: [name] Horn             Date of Birth: 1976/07/07           MRN: [id number]             PCP: Alan Ripper, [place] Referring: Alan Ripper, [place] Visit Date: 04/26/2021 Occupation: @GUAROCC @  Subjective:  Discuss starting Methotrexate   History of Present Illness: [name] Horn is a 45 y.o. female with history of seropositive rheumatoid arthritis and osteoarthritis. She presents today to discuss restarting on Methotrexate after receiving clearance from Dr. Vickki Muff at [facility].  Activities of Daily Living:  Patient reports morning stiffness for *** {minute/hour:19697}.   Patient {ACTIONS;DENIES/REPORTS:21021675::"Denies"} nocturnal pain.  Difficulty dressing/grooming: {ACTIONS;DENIES/REPORTS:21021675::"Denies"} Difficulty climbing stairs: {ACTIONS;DENIES/REPORTS:21021675::"Denies"} Difficulty getting out of chair: {ACTIONS;DENIES/REPORTS:21021675::"Denies"} Difficulty using hands for taps, buttons, cutlery, and/or writing: {ACTIONS;DENIES/REPORTS:21021675::"Denies"}  No Rheumatology ROS completed.   PMFS History:  Patient Active Problem List   Diagnosis Date Noted  . Fatigue 12/30/2020  . Malaise 12/30/2020  . FUO (fever of unknown origin) 10/05/2020  . Stomatitis 09/03/2020  . Rheumatoid arthritis ([place]) 08/12/2020  . Herpes labialis 08/12/2020  . Transaminitis 08/12/2020  . 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 Intertrigo 06/22/2020  . Interstitial cystitis 05/18/2020  . 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 Chronic rhinitis 04/23/2020  . Adverse food reaction 04/23/2020  . Eosinophilic esophagitis 10/19/8526  . Oral candidiasis 04/02/2020  . History of Clostridium difficile infection 04/02/2020  . History of shingles 04/02/2020  . History of asthma 04/02/2020  . Multiple drug allergies 04/02/2020  . Environmental  allergies 04/02/2020  . History of loop recorder 01/19/2020  . PVC (premature ventricular contraction) 01/19/2020  . Uterine cancer ([place]) 01/19/2020  . Colitis 08/14/2019  . Constipation   . Gastroparesis 04/03/2019  . Palpitations 04/10/2018  . Nausea and vomiting 04/26/2017  . Nausea & vomiting 04/25/2017  . Chest pain 04/25/2017  . Enterocolitis due to Clostridium difficile 01/04/2016  . Incisional hernia without obstruction or gangrene 11/18/2015  . Personal history of other diseases of the nervous system and sense organs 10/14/2015  . Disease of pancreas 01/21/2015  . Other specified bacterial agents as the cause of diseases classified elsewhere 01/21/2015  . Radiculopathy of cervical region 12/09/2014  . Annual physical exam 01/30/2012  . Melena 01/30/2012  . Pain in ankle 11/24/2010  . ANXIETY 10/11/2010  . DVT 10/11/2010  . COSTOCHONDRITIS 03/03/2008  . Cellulitis of toe 12/01/2007  . MENOPAUSE, PREMATURE 07/20/2007  . UTERINE CANCER, HX OF 07/16/2007  . Other postprocedural status(V45.89) 07/16/2007    Past Medical History:  Diagnosis Date  . Complication of anesthesia    " i TAKE A LIITLE BIT LONGER TO [place] UP "  . DVT (deep venous thrombosis) ([place])    x2  . Fatigue 12/30/2020  . Gastroparesis 08/07/2019  . 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 Herpes labialis 08/12/2020  . Intertrigo 06/22/2020  . Lymphedema   . Malaise 12/30/2020  . RA (rheumatoid arthritis) ([place])   . Rheumatoid arthritis ([place]) 08/12/2020  . Transaminitis 08/12/2020  . Uterine cancer ([place])     Family History  Problem Relation Age of Onset  . Hypertension Other   . Hyperlipidemia Other   . Colon cancer Other        grandmother  . Hashimoto's thyroiditis Mother   . Eczema Mother   . Angioedema Mother   .  Throat cancer Father   . Arrhythmia Father   . Angioedema Maternal Grandfather    Past Surgical History:  Procedure Laterality Date  . APPENDECTOMY    . cheek biopsy  11/2020  .  CHOLECYSTECTOMY    . ESOPHAGOGASTRODUODENOSCOPY N/A 04/30/2017   Procedure: ESOPHAGOGASTRODUODENOSCOPY (EGD);  Surgeon: Teena Irani, MD;  Location: [facility] ENDOSCOPY;  Service: Endoscopy;  Laterality: N/A;  . HERNIA REPAIR     x2  . KNEE SURGERY     x3  . MINIMALLY INVASIVE FORAMINOTOMY CERVICAL SPINE     C6-T1, Nitka  . SAVORY DILATION N/A 04/30/2017   Procedure: SAVORY DILATION;  Surgeon: Teena Irani, MD;  Location: [facility] ENDOSCOPY;  Service: Endoscopy;  Laterality: N/A;  . TONSILLECTOMY    . TOTAL ABDOMINAL HYSTERECTOMY     Sarcoma, s/p XRT   Social History   Social History Narrative  . Not on file   Immunization History  Administered Date(s) Administered  . Influenza, Seasonal, Injecte, Preservative Fre 10/22/2014, 10/23/2015, 09/10/2018  . Influenza,inj,Quad PF,6+ Mos 11/21/2017, 09/10/2018  . Influenza-Unspecified 10/22/2014, 10/23/2015  . PFIZER(Purple Top)SARS-COV-2 Vaccination 12/27/2019, 02/24/2020, 09/09/2020, 03/09/2021  . Pneumococcal Conjugate-13 10/30/2019  . Tdap 04/19/2020     Objective: Vital Signs: There were no vitals taken for this visit.   Physical Exam Vitals and nursing note reviewed.  Constitutional:      Appearance: She is well-developed.  HENT:     Head: Normocephalic and atraumatic.  Eyes:     Conjunctiva/sclera: Conjunctivae normal.  Abdominal:     Palpations: Abdomen is soft.  Musculoskeletal:     Cervical back: Normal range of motion.  Skin:    General: Skin is warm and dry.     Capillary Refill: Capillary refill takes less than 2 seconds.  Neurological:     Mental Status: She is alert and oriented to person, place, and time.  Psychiatric:        Behavior: Behavior normal.      Musculoskeletal Exam: ***  CDAI Exam: CDAI Score: -- Patient Global: --; Provider Global: -- Swollen: --; Tender: -- Joint Exam 04/26/2021   No joint exam has been documented for this visit   There is currently no information documented on the homunculus. Go  to the Rheumatology activity and complete the homunculus joint exam.  Investigation: No additional findings.  Imaging: No results found.  Recent Labs: Lab Results  Component Value Date   WBC 3.3 (L) 12/30/2020   HGB 11.3 (L) 12/30/2020   PLT 186 12/30/2020   NA 140 12/30/2020   K 4.3 12/30/2020   CL 108 12/30/2020   CO2 25 12/30/2020   GLUCOSE 73 12/30/2020   BUN 9 12/30/2020   CREATININE 0.83 12/30/2020   BILITOT 0.4 12/30/2020   ALKPHOS 71 01/26/2020   AST 33 12/30/2020   ALT 20 12/30/2020   PROT 6.9 12/30/2020   ALBUMIN 4.1 01/26/2020   CALCIUM 9.4 12/30/2020   GFRAA 98 12/30/2020   QFTBGOLDPLUS NEGATIVE 10/23/2019    Speciality Comments: No specialty comments available.  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  No orders of the defined types were placed in this encounter.   Face-to-face time spent with patient was *** minutes. Greater than 50% of time was spent in counseling and coordination of care.  Follow-Up Instructions: No follow-ups on file.   Ofilia Neas, PA-C  Note - This record has been created using  Dragon software.  Chart creation errors have been sought, but may not always  have been located. Such creation errors do not reflect on  the standard of medical care.

## 2021-04-27 ENCOUNTER — Other Ambulatory Visit: Payer: Self-pay

## 2021-04-27 ENCOUNTER — Ambulatory Visit (INDEPENDENT_AMBULATORY_CARE_PROVIDER_SITE_OTHER): Payer: 59 | Admitting: Infectious Diseases

## 2021-04-27 DIAGNOSIS — B370 Candidal stomatitis: Secondary | ICD-10-CM | POA: Diagnosis not present

## 2021-04-27 DIAGNOSIS — M05712 Rheumatoid arthritis with rheumatoid factor of left shoulder without organ or systems involvement: Secondary | ICD-10-CM | POA: Diagnosis not present

## 2021-04-27 MED ORDER — NYSTATIN 100000 UNIT/ML MT SUSP: 5.0000 mL | Freq: Three times a day (TID) | OROMUCOSAL | 1 refills | Status: DC | PRN

## 2021-04-27 NOTE — Progress Notes (Signed)
Subjective:    Patient ID: [name] Horn, female    DOB: 08-31-1976, 45 y.o.   MRN: [id number]  HPI 45 year old Caucasian lady with hx of seropositive RA with keratoconjunctivitis sicca, and osteoarthritis.who  Started on MTX in November of  2020. She also  Has hx of eosinophilic esophagitis and was on fluticasone. She has had history of C difficile with 3 episodes. She  Had problems with recurrent strep throat and had tonsillectomy, pneumonia a few times, ear infection, tooth abscess. She  Has had  Shingles outbreak already. She then began to have thrush in January  which was attributed to her  Oral corticosteroid therapy but  Has persisted despite this being stopped.   She was taking nystatin swish and  Swallow and previously twice weekly fluconazole at 200 mg  While her  Overall immunoglobulin levels were normal she did not mount immune response to vaccination with pneumovax and prevnar with antibodies checked > 4 months after this that were low.   After 2 COVID vaccines she had not much of a response in terms of flu like  Symptoms etc that are typically experienced by patients with 2 dose mRNA based vaccines.   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  During one visit several visits ago she appeared to be suffering from intertrigo.  I increased her fluconazole 200 mg daily prophylactic dose and a higher dose.  She ended up developing stomatitis but missed her appointment with Dr. Megan Salon was late again and then finally did see him he did a swab for fungal culture which is growing copious Candida albicans   Sensitivity data on her candida showed:  organism CANDIDA ALBICANS   Antimicrobial 1 FLUCONAZOLE   MIC 0.500 S   Antimicrobial 2 VORICONAZOLE   MIC <=0.008 S   Antimicrobial 3 POSACONAZOLE   MIC 0.030   Antimicrobial 4 CASPOFUNGIN   MIC 0.015 S   Comment: .    She  remained on fluconazole at high dose since then and was seen by Terri Piedra in the interim.  Her tongue lesions had not resolved though she was telling me that her tongue was "completely pink in week prior to one visit.  She was then OFF of azole therapy and saw Dr. Redmond Baseman who did perform a biopsy but not of the tongue but rather the buccal mucosa where she had more inflammation and possible thrush.  Biopsy came back without malignancy, hyperkeratosis with some yeast as well  After stopping methotrexate, she had fewer lesions.   Also been seen [facility] by an allergist and immunologist there who is working her up for immunodeficiency.  She has remained on her IVIG which the immunologist at [facility] was aware of and wanted her apparently to remain on it in the interim.  She returns to clinic today She was last seen by Dr Tommy Medal 4-8, she was off anti-fungals at that time. She was felt to have geographic tongue as well as possible changes from RA.  She was seen by ID at [facility] 04-21-[facility]. Dr Vickki Muff. She was told to f/u at [facility].  This AM she felt that she had bubbling in her throat, worsening d/c.  She has had soreness in her throat as well. Voice is hoarse.   Review of Systems  Constitutional: Positive for fever (low grade). Negative for chills and unexpected weight change.  HENT:  Positive for mouth sores, trouble swallowing (dyscomfort) and voice change.   Cardiovascular: Positive for leg swelling.  Gastrointestinal: Negative for constipation and diarrhea.  Genitourinary: Negative for difficulty urinating.       Objective:   Physical Exam Vitals reviewed.  Constitutional:      Appearance: Normal appearance.  HENT:     Mouth/Throat:     Mouth: Mucous membranes are moist.     Pharynx: Oropharyngeal exudate present.  Eyes:     Extraocular Movements: Extraocular movements intact.     Pupils: Pupils are equal, round, and reactive to light.  Cardiovascular:     Rate  and Rhythm: Normal rate and regular rhythm.  Pulmonary:     Effort: Pulmonary effort is normal.     Breath sounds: Normal breath sounds.  Abdominal:     General: Bowel sounds are normal. There is no distension.     Palpations: Abdomen is soft.     Tenderness: There is no abdominal tenderness.  Musculoskeletal:     Cervical back: Normal range of motion and neck supple.     Right lower leg: No edema.     Left lower leg: No edema.  Neurological:     General: No focal deficit present.     Mental Status: She is alert.  Psychiatric:        Mood and Affect: Affect is blunt.         Assessment & Plan:

## 2021-04-27 NOTE — Assessment & Plan Note (Signed)
States her L eye is dry, she will f/u with her ophtho.

## 2021-04-27 NOTE — Assessment & Plan Note (Signed)
She has mostly findings on her tongue. She has some small areas on the R buccal area.  She has had relief from fluconazole and nystatin before.  Will refill her magic mouthwash to see if this gives her relief.  I agree with Dr Derek Mound notes- she has multiple possible etiologies for this.  She can f/u with Dr Tommy Medal.

## 2021-04-29 ENCOUNTER — Telehealth: Payer: Self-pay

## 2021-04-29 NOTE — Telephone Encounter (Signed)
Opened in error

## 2021-04-30 ENCOUNTER — Other Ambulatory Visit: Payer: Self-pay

## 2021-04-30 ENCOUNTER — Encounter: Payer: Self-pay | Admitting: Rheumatology

## 2021-04-30 ENCOUNTER — Ambulatory Visit: Payer: 59 | Admitting: Rheumatology

## 2021-04-30 VITALS — BP 116/81 | HR 78 | Ht 64.0 in | Wt 166.0 lb

## 2021-04-30 DIAGNOSIS — M7918 Myalgia, other site: Secondary | ICD-10-CM

## 2021-04-30 DIAGNOSIS — G5601 Carpal tunnel syndrome, right upper limb: Secondary | ICD-10-CM | POA: Diagnosis not present

## 2021-04-30 DIAGNOSIS — M3501 Sicca syndrome with keratoconjunctivitis: Secondary | ICD-10-CM

## 2021-04-30 DIAGNOSIS — M0579 Rheumatoid arthritis with rheumatoid factor of multiple sites without organ or systems involvement: Secondary | ICD-10-CM

## 2021-04-30 DIAGNOSIS — D808 Other immunodeficiencies with predominantly antibody defects: Secondary | ICD-10-CM

## 2021-04-30 DIAGNOSIS — K200 Eosinophilic esophagitis: Secondary | ICD-10-CM

## 2021-04-30 DIAGNOSIS — Z79899 Other long term (current) drug therapy: Secondary | ICD-10-CM | POA: Diagnosis not present

## 2021-04-30 DIAGNOSIS — Z8619 Personal history of other infectious and parasitic diseases: Secondary | ICD-10-CM

## 2021-04-30 DIAGNOSIS — K3184 Gastroparesis: Secondary | ICD-10-CM

## 2021-04-30 DIAGNOSIS — M5030 Other cervical disc degeneration, unspecified cervical region: Secondary | ICD-10-CM

## 2021-04-30 DIAGNOSIS — Z8542 Personal history of malignant neoplasm of other parts of uterus: Secondary | ICD-10-CM

## 2021-04-30 DIAGNOSIS — Z8659 Personal history of other mental and behavioral disorders: Secondary | ICD-10-CM

## 2021-04-30 DIAGNOSIS — K141 Geographic tongue: Secondary | ICD-10-CM

## 2021-04-30 DIAGNOSIS — I7300 Raynaud's syndrome without gangrene: Secondary | ICD-10-CM

## 2021-04-30 NOTE — Progress Notes (Signed)
Office Visit Note  Patient: [name] Horn             Date of Birth: [birth year]           MRN: [id number]             PCP: Alan Ripper, [place] Referring: Alan Ripper, [place] Visit Date: 04/30/2021 Occupation: @GUAROCC @  Subjective:  Increased pain in hands and feet.   History of Present Illness: [name] Horn is a 45 y.o. female with history of seropositive rheumatoid arthritis, sicca symptoms, Raynauds and myofascial pain syndrome.  She has been off methotrexate for 4 months now.  She had IVIG therapy by Dr. Tommy Medal for recurrent oral lesions.  She states she still have recurrent oral ulcers.  She was seen at the ID office last weeks due to thrush.  She was treated with nystatin mouthwash.  She states the thrush resolved after using nystatin mouthwash.  She was also seen at [facility] infectious disease clinic where Dr. Vickki Muff evaluated her and felt that she can go back on immunosuppressive agents.  She has had less frequent oral ulcers but continues to have thrush despite being off methotrexate.   Activities of Daily Living:  Patient reports morning stiffness for 30 minutes.   Patient Reports nocturnal pain.  Difficulty dressing/grooming: Denies Difficulty climbing stairs: Reports Difficulty getting out of chair: Reports Difficulty using hands for taps, buttons, cutlery, and/or writing: Reports  Review of Systems  Constitutional: Positive for fatigue. Negative for night sweats, weight gain and weight loss.  HENT: Negative for mouth sores, trouble swallowing, trouble swallowing, mouth dryness and nose dryness.   Eyes: Positive for pain, itching, visual disturbance and dryness. Negative for redness.  Respiratory: Negative for cough, shortness of breath and difficulty breathing.   Cardiovascular: Negative for chest pain, palpitations, hypertension, irregular heartbeat and swelling in legs/feet.  Gastrointestinal: Positive for blood in stool. Negative for constipation and diarrhea.   Endocrine: Negative for increased urination.  Genitourinary: Negative for difficulty urinating and vaginal dryness.  Musculoskeletal: Positive for arthralgias, joint pain, myalgias, morning stiffness, muscle tenderness and myalgias. Negative for joint swelling and muscle weakness.  Skin: Positive for color change. Negative for rash, hair loss, redness, skin tightness, ulcers and sensitivity to sunlight.  Allergic/Immunologic: Positive for susceptible to infections.  Neurological: Positive for numbness. Negative for dizziness, headaches, memory loss, night sweats and weakness.  Hematological: Positive for bruising/bleeding tendency. Negative for swollen glands.  Psychiatric/Behavioral: Positive for sleep disturbance. Negative for depressed mood and confusion. The patient is not nervous/anxious.     PMFS History:  Patient Active Problem List   Diagnosis Date Noted  . Fatigue 12/30/2020  . Malaise 12/30/2020  . FUO (fever of unknown origin) 10/05/2020  . Stomatitis 09/03/2020  . Rheumatoid arthritis ([place]) 08/12/2020  . Herpes labialis 08/12/2020  . Transaminitis 08/12/2020  . 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 Intertrigo 06/22/2020  . Interstitial cystitis 05/18/2020  . 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 Chronic rhinitis 04/23/2020  . Adverse food reaction 04/23/2020  . Eosinophilic esophagitis [id number]  . Oral candidiasis 04/02/2020  . History of Clostridium difficile infection 04/02/2020  . History of shingles 04/02/2020  . History of asthma 04/02/2020  . Multiple drug allergies 04/02/2020  . Environmental allergies 04/02/2020  . History of loop recorder 01/19/2020  . PVC (premature ventricular contraction) 01/19/2020  . Uterine cancer ([place]) 01/19/2020  . Colitis 08/14/2019  .  Constipation   . Gastroparesis 04/03/2019  . Palpitations 04/10/2018  . Nausea and  vomiting 04/26/2017  . Nausea & vomiting 04/25/2017  . Chest pain 04/25/2017  . Enterocolitis due to Clostridium difficile 01/04/2016  . Incisional hernia without obstruction or gangrene 11/18/2015  . Personal history of other diseases of the nervous system and sense organs 10/14/2015  . Disease of pancreas 01/21/2015  . Other specified bacterial agents as the cause of diseases classified elsewhere 01/21/2015  . Radiculopathy of cervical region 12/09/2014  . Annual physical exam 01/30/2012  . Melena 01/30/2012  . Pain in ankle 11/24/2010  . ANXIETY 10/11/2010  . DVT 10/11/2010  . COSTOCHONDRITIS 03/03/2008  . Cellulitis of toe 12/01/2007  . MENOPAUSE, PREMATURE 07/20/2007  . UTERINE CANCER, HX OF 07/16/2007  . Other postprocedural status(V45.89) 07/16/2007    Past Medical History:  Diagnosis Date  . Complication of anesthesia    " i TAKE A LIITLE BIT LONGER TO [place] UP "  . DVT (deep venous thrombosis) ([place])    x2  . Fatigue 12/30/2020  . Gastroparesis 08/07/2019  . 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 Herpes labialis 08/12/2020  . Intertrigo 06/22/2020  . Lymphedema   . Malaise 12/30/2020  . RA (rheumatoid arthritis) ([place])   . Rheumatoid arthritis ([place]) 08/12/2020  . Transaminitis 08/12/2020  . Uterine cancer ([place])     Family History  Problem Relation Age of Onset  . Hypertension Other   . Hyperlipidemia Other   . Colon cancer Other        grandmother  . Hashimoto's thyroiditis Mother   . Eczema Mother   . Angioedema Mother   . Throat cancer Father   . Arrhythmia Father   . Angioedema Maternal Grandfather    Past Surgical History:  Procedure Laterality Date  . APPENDECTOMY    . cheek biopsy  11/2020  . CHOLECYSTECTOMY    . ESOPHAGOGASTRODUODENOSCOPY N/A 04/30/2017   Procedure: ESOPHAGOGASTRODUODENOSCOPY (EGD);  Surgeon: Teena Irani, MD;  Location[facility] ENDOSCOPY;  Service: Endoscopy;  Laterality: N/A;  . HERNIA REPAIR     x2  . KNEE SURGERY     x3  . MINIMALLY INVASIVE  FORAMINOTOMY CERVICAL SPINE     C6-T1, [place]  . SAVORY DILATION N/A 04/30/2017   Procedure: SAVORY DILATION;  Surgeon: Teena Irani, MD;  Location: [facility] ENDOSCOPY;  Service: Endoscopy;  Laterality: N/A;  . TONSILLECTOMY    . TOTAL ABDOMINAL HYSTERECTOMY     Sarcoma, s/p XRT   Social History   Social History Narrative  . Not on file   Immunization History  Administered Date(s) Administered  . Influenza, Seasonal, Injecte, Preservative Fre 10/22/2014, 10/23/2015, 09/10/2018  . Influenza,inj,Quad PF,6+ Mos 11/21/2017, 09/10/2018  . Influenza-Unspecified 10/22/2014, 10/23/2015  . PFIZER(Purple Top)SARS-COV-2 Vaccination 12/27/2019, 02/24/2020, 09/09/2020, 03/09/2021  . Pneumococcal Conjugate-13 10/30/2019  . Tdap 04/19/2020     Objective: Vital Signs: BP 116/81 (BP Location: Left Arm, Patient Position: Sitting, Cuff Size: Normal)   Pulse 78   Ht 5\' 4"  (1.626 m)   Wt 166 lb (75.3 kg)   BMI 28.49 kg/m    Physical Exam Vitals and nursing note reviewed.  Constitutional:      Appearance: She is well-developed.  HENT:     Head: Normocephalic and atraumatic.  Eyes:     Conjunctiva/sclera: Conjunctivae normal.  Cardiovascular:     Rate and Rhythm: Normal rate and regular rhythm.     Heart sounds: Normal heart sounds.  Pulmonary:     Effort: Pulmonary effort is  normal.     Breath sounds: Normal breath sounds.  Abdominal:     General: Bowel sounds are normal.     Palpations: Abdomen is soft.  Musculoskeletal:     Cervical back: Normal range of motion.  Lymphadenopathy:     Cervical: No cervical adenopathy.  Skin:    General: Skin is warm and dry.     Capillary Refill: Capillary refill takes less than 2 seconds.  Neurological:     Mental Status: She is alert and oriented to person, place, and time.  Psychiatric:        Behavior: Behavior normal.      Musculoskeletal Exam: C-spine was in good range of motion with some stiffness.  Shoulder joints, elbow joints, wrist joints,  MCPs PIPs and DIPs with good range of motion with no synovitis.  Hip joints, knee joints, ankles, MTPs were in good range of motion.  She had no tenderness over ankles or MTPs.  CDAI Exam: CDAI Score: 0.2  Patient Global: 2 mm; Provider Global: 0 mm Swollen: 0 ; Tender: 0  Joint Exam 04/30/2021   No joint exam has been documented for this visit   There is currently no information documented on the homunculus. Go to the Rheumatology activity and complete the homunculus joint exam.  Investigation: No additional findings.  Imaging: No results found.  Recent Labs: Lab Results  Component Value Date   WBC 3.3 (L) 12/30/2020   HGB 11.3 (L) 12/30/2020   PLT 186 12/30/2020   NA 140 12/30/2020   K 4.3 12/30/2020   CL 108 12/30/2020   CO2 25 12/30/2020   GLUCOSE 73 12/30/2020   BUN 9 12/30/2020   CREATININE 0.83 12/30/2020   BILITOT 0.4 12/30/2020   ALKPHOS 71 01/26/2020   AST 33 12/30/2020   ALT 20 12/30/2020   PROT 6.9 12/30/2020   ALBUMIN 4.1 01/26/2020   CALCIUM 9.4 12/30/2020   GFRAA 98 12/30/2020   QFTBGOLDPLUS NEGATIVE 10/23/2019   March 31, 2021 WBC 7.8, hemoglobin 14.0, platelets 356, CMP normal,  Speciality Comments: No specialty comments available.  Procedures:  No procedures performed Allergies: Other, Sulfa antibiotics, Sulfonamide derivatives, and Benadryl [diphenhydramine hcl]   Assessment / Plan:     Visit Diagnoses: Rheumatoid arthritis involving multiple sites with positive rheumatoid factor (HCC)-patient has been off methotrexate for at least 4 months now.  She states she has been experiencing some stiffness in her hands and her feet.  On my examination she did not have any synovitis today.  I believe the stiffness and discomfort is coming from underlying osteoarthritis.  I have advised her to contact me if she develops swelling.  I do not see the need for immunosuppressive therapy.  If she has a flare of rheumatoid arthritis again I may consider leflunomide.   I briefly discussed the side effects of leflunomide with the patient.  High risk medication use -she was treated with methotrexate in the past due to joint pain and joint swelling.  Methotrexate was discontinued due to frequent oral ulcers.  Keratoconjunctivitis sicca (HCC) - Followed by Dr. Katy Fitch for recurrent conjunctivitis and keratoconjunctivitis.  AVISE index -2.4, all labs were negative except RF IgA 39.  She has been using eyedrops.  Her conjunctiva were clear on exam today.  Carpal tunnel syndrome of right wrist-she gives history of intermittent paresthesias.  Tinel's Phalen's and manual compression test were negative today.  Raynaud's syndrome without gangrene-currently not active.  Geographic tongue - And stomatitis.  Throat cultures negative for  fungi and strep A on 04/02/21. She had no response to IVIG.  She had a recent episode of thrush last week which was treated with nystatin mouthwash.  Her tongue was clear today.  Eosinophilic esophagitis - Hx of dilatations. Responsive to oral prednisone.  DDD (degenerative disc disease), cervical-she continues to have some stiffness.  Myofascial pain-she gives history of generalized pain and myalgias.  Need for regular exercise and stretching was emphasized.  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 Tommy Medal.  History of recurrent/frequent infections.  She is on IVIG treatment.  She was also seen by [facility].  The records from [facility] were reviewed with the patient.  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   History of anxiety  Orders: No orders of the defined types were placed in this encounter.  No orders of the defined types were placed in this encounter.    Follow-Up  Instructions: Return in about 4 months (around 08/31/2021) for Rheumatoid arthritis.   Bo Merino, MD  Note - This record has been created using Editor, commissioning.  Chart creation errors have been sought, but may not always  have been located. Such creation errors do not reflect on  the standard of medical care.

## 2021-05-06 ENCOUNTER — Other Ambulatory Visit: Payer: Self-pay

## 2021-05-06 ENCOUNTER — Ambulatory Visit (HOSPITAL_COMMUNITY)
Admission: RE | Admit: 2021-05-06 | Discharge: 2021-05-06 | Disposition: A | Discharge status: Home / Self Care | Payer: 59 | Source: Ambulatory Visit | Attending: Internal Medicine | Admitting: Internal Medicine

## 2021-05-06 DIAGNOSIS — M059 Rheumatoid arthritis with rheumatoid factor, unspecified: Secondary | ICD-10-CM | POA: Insufficient documentation

## 2021-05-06 MED ORDER — IMMUNE GLOBULIN (HUMAN) 10 GM/100ML IV SOLN
32000.0000 mg | Freq: Once | INTRAVENOUS | Status: AC
  Administered 2021-05-06: 30 g via INTRAVENOUS
  Filled 2021-05-06: qty 100

## 2021-05-07 ENCOUNTER — Ambulatory Visit (INDEPENDENT_AMBULATORY_CARE_PROVIDER_SITE_OTHER): Payer: 59 | Admitting: Infectious Disease

## 2021-05-07 ENCOUNTER — Telehealth: Payer: Self-pay

## 2021-05-07 ENCOUNTER — Other Ambulatory Visit: Payer: Self-pay

## 2021-05-07 ENCOUNTER — Encounter: Payer: Self-pay | Admitting: Infectious Disease

## 2021-05-07 VITALS — BP 125/84 | HR 77 | Temp 97.8°F | Wt 169.0 lb

## 2021-05-07 DIAGNOSIS — A0472 Enterocolitis due to Clostridium difficile, not specified as recurrent: Secondary | ICD-10-CM | POA: Diagnosis not present

## 2021-05-07 DIAGNOSIS — B001 Herpesviral vesicular dermatitis: Secondary | ICD-10-CM | POA: Diagnosis not present

## 2021-05-07 DIAGNOSIS — B370 Candidal stomatitis: Secondary | ICD-10-CM | POA: Diagnosis not present

## 2021-05-07 DIAGNOSIS — K141 Geographic tongue: Secondary | ICD-10-CM

## 2021-05-07 DIAGNOSIS — D808 Other immunodeficiencies with predominantly antibody defects: Secondary | ICD-10-CM

## 2021-05-07 DIAGNOSIS — M05712 Rheumatoid arthritis with rheumatoid factor of left shoulder without organ or systems involvement: Secondary | ICD-10-CM

## 2021-05-07 DIAGNOSIS — D806 Antibody deficiency with near-normal immunoglobulins or with hyperimmunoglobulinemia: Secondary | ICD-10-CM

## 2021-05-07 NOTE — Telephone Encounter (Signed)
Patient called office today stating she is having flare up. States she is having white patches on lips, under tongue, and cheeks. Would like to know if MD would like to do a culture or biopsy.  Will forward message to MD. Leatrice Jewels, RMA

## 2021-05-07 NOTE — Progress Notes (Signed)
Subjective:   Chief complaint: [name] Horn is concerned about several areas in her mouth under her tongue and her buccal mucosa posterior inner oropharynx    Patient ID: [name] Horn, female    DOB: Apr 17, 1976, 45 y.o.   MRN: [id number]  HPI   45 year old Caucasian lady with hx of seropositive RA with keratoconjunctivitis sicca, and osteoarthritis.who  Started on MTX in November of  2020. She also  Has hx of eosinophilic esophagitis and was on fluticasone. She has had history of C difficile with 3 episodes. She  Had problems with recurrent strep throat and had tonsillectomy, pneumonia a few times, ear infection, tooth abscess. She  Has had  Shingles outbreak already. She then began to have thrush in January  which was attributed to her  Oral corticosteroid therapy but  Has persisted despite this being stopped.   She was taking nystatin swish and  Swallow and previously twice weekly fluconazole at 200 mg  While her  Overall immunoglobulin levels were normal she did not mount immune response to vaccination with pneumovax and prevnar with antibodies checked > 4 months after this that were Horn.  She was bitten by a cat and recently had tetanus vaccine in ER and antibiotics.  After 2 COVID vaccines she had not much of a response in terms of flu like  Symptoms etc that are typically experienced by patients with 2 dose mRNA based vaccines.   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  During one visit several visits ago she appeared to be suffering from intertrigo.  I increased her fluconazole 200 mg daily prophylactic dose and a higher dose  She then  continued to have abnormalities on her tongue but it was not clear to me that this is really thrush but could be something else such as geographic tongue for example.  I ended up taking her off of the fluconazole with desire to see how she did off of  it.  She ended up developing stomatitis but missed her appointment with Dr. Megan Salon was late again and then finally did see him he did a swab for fungal culture which is growing copious Candida albicans   Sensitivity data on her candida showed:  organism CANDIDA ALBICANS   Antimicrobial 1 FLUCONAZOLE   MIC 0.500 S   Antimicrobial 2 VORICONAZOLE   MIC <=0.008 S   Antimicrobial 3 POSACONAZOLE   MIC 0.030   Antimicrobial 4 CASPOFUNGIN   MIC 0.015 S   Comment: .    She remained on fluconazole at high dose since then and was seen by Terri Piedra in the interim.  Her tongue lesions had not resolved though she was telling me that her tongue was "completely pink in week prior to one visit.  Again I have felt that her story fits much better with geographic tongue then with candidiasis.  I wanted to observe her again off of antifungal therapy.   Since taking her off of the fluconazole  week prior to my last visit whether her tongue actually looked improved.  She did  say she is having some soreness in the entire of her mouth.  I really have not thought this has anything to do with Candida though.   She was then OFF of azole therapy and saw Dr. Redmond Baseman who did perform a biopsy but not of the tongue but rather the buccal mucosa where she had  more inflammation and possible thrush.  Biopsy came back without malignancy, hyperkeratosis with some yeast as well  She had gone back on nystatin (and also fluconazole) though I had really only wanted her to start the former.  Tongue looked stable to improved to me and buccal mucosa resolved.  I then suggested that we stop the methotrexate as it could potentially be contributing to her oral lesions.  It is continue to be held by rheumatology   Since then she has had much less frequent outbreaks of these lesions on her tongue and inner buccal mucosa.  Also been seen [facility] by an allergist and immunologist there who is working  her up for immunodeficiency.  She has remained on her IVIG which the immunologist at [facility] was aware of and wanted her apparently to remain on it in the interim.  Tells me that her last visit with me that she thought she was again having an outbreak of lesions on her buccal mucosa and tongue and seen by PCP who prescribed fluconazole which she said led to rapid resolution of the lesions.  I continued to have skepticism and to voice skepticism that this is due to Candida.  She was told to come into my office or ENT to have a swab sent for fungal cultures if she was having the beginning of another outbreak.  We sent cultures on 8 April for both routine cultures and fungal cultures and neither 1 yielded an organism, specifically with no yeast whatsoever even on fungal culture were isolated.  Seen by Dr. Caro Hight at [facility].  He was also skeptical that these lesions had something to do with yeast and thought they are more likely rheumatological in nature.  He was more supportive of restarting methotrexate and if the lesions worsen exploring other immunosuppressive therapy.  He also asked immunology to consider Crypopyrinh-associated periodic syndromes (CAPS), including [place]. This family    She then saw my partner Dr. Johnnye Sima a few weeks ago and he also agreed that she did not likely have a candidal infection.  He prescribed Magic mouthwash for this.  In the interim the patient has had IVIG infusion yesterday.  She has some new areas she is concerned about 1 that is coming up on her buccal mucosa on the right another area under her lung tongue and the sublingual space and also an area in the back of her throat which she says is "like I have a tonsil where I do not have a tonsil.     Past Medical History:  Diagnosis Date  . Complication of anesthesia    " i TAKE A LIITLE BIT LONGER TO [place] UP "  . DVT (deep venous thrombosis) ([place])    x2  . Fatigue 12/30/2020  .  Gastroparesis 08/07/2019  . 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 Herpes labialis 08/12/2020  . Intertrigo 06/22/2020  . Lymphedema   . Malaise 12/30/2020  . RA (rheumatoid arthritis) ([place])   . Rheumatoid arthritis ([place]) 08/12/2020  . Transaminitis 08/12/2020  . Uterine cancer [facility])     Past Surgical History:  Procedure Laterality Date  . APPENDECTOMY    . cheek biopsy  11/2020  . CHOLECYSTECTOMY    . ESOPHAGOGASTRODUODENOSCOPY N/A 04/30/2017   Procedure: ESOPHAGOGASTRODUODENOSCOPY (EGD);  Surgeon: Teena Irani, MD;  Location: [facility] ENDOSCOPY;  Service: Endoscopy;  Laterality: N/A;  . HERNIA REPAIR     x2  . KNEE SURGERY     x3  . MINIMALLY INVASIVE  FORAMINOTOMY CERVICAL SPINE     C6-T1, Nitka  . SAVORY DILATION N/A 04/30/2017   Procedure: SAVORY DILATION;  Surgeon: Teena Irani, MD;  Location: [facility] ENDOSCOPY;  Service: Endoscopy;  Laterality: N/A;  . TONSILLECTOMY    . TOTAL ABDOMINAL HYSTERECTOMY     Sarcoma, s/p XRT    Family History  Problem Relation Age of Onset  . Hypertension Other   . Hyperlipidemia Other   . Colon cancer Other        grandmother  . Hashimoto's thyroiditis Mother   . Eczema Mother   . Angioedema Mother   . Throat cancer Father   . Arrhythmia Father   . Angioedema Maternal Grandfather       Social History   Socioeconomic History  . Marital status: Married    Spouse name: Not on file  . Number of children: Not on file  . Years of education: Not on file  . Highest education level: Not on file  Occupational History  . Not on file  Tobacco Use  . Smoking status: Passive Smoke Exposure - Never Smoker  . Smokeless tobacco: Never Used  Vaping Use  . Vaping Use: Never used  Substance and Sexual Activity  . Alcohol use: Not Currently  . Drug use: No  . Sexual activity: Yes    Partners: Male    Birth control/protection: None  Other Topics Concern  . Not on file  Social History Narrative  . Not on file   Social Determinants of Health   Financial  Resource Strain: Not on file  Food Insecurity: Not on file  Transportation Needs: Not on file  Physical Activity: Not on file  Stress: Not on file  Social Connections: Not on file    Allergies  Allergen Reactions  . Other     Dermabond surgical glue.   . Sulfa Antibiotics Anaphylaxis, Shortness Of Breath, Swelling and Rash    Angioedema (also)  . Sulfonamide Derivatives Hives, Shortness Of Breath and Swelling    TONGUE SWELLS  . Benadryl [Diphenhydramine Hcl] Other (See Comments)    Hyperactivity      Current Outpatient Medications:  .  amphetamine-dextroamphetamine (ADDERALL) 20 MG tablet, Take 20 mg by mouth 3 (three) times daily., Disp: , Rfl:  .  atropine 1 % ophthalmic solution, Place 1 drop into the left eye daily as needed (for dry eye)., Disp: , Rfl:  .  baclofen (LIORESAL) 10 MG tablet, Take 10 mg by mouth daily., Disp: , Rfl:  .  Bepotastine Besilate 1.5 % SOLN, Place 1 drop into both eyes 2 (two) times a day., Disp: , Rfl:  .  cycloSPORINE (RESTASIS) 0.05 % ophthalmic emulsion, Place 1 drop into both eyes 2 (two) times daily., Disp: , Rfl:  .  dicyclomine (BENTYL) 10 MG capsule, Take 10 mg by mouth daily as needed for nausea/vomiting., Disp: , Rfl:  .  folic acid (FOLVITE) 1 MG tablet, Take 2 tablets (2 mg total) by mouth daily., Disp: 180 tablet, Rfl: 3 .  furosemide (LASIX) 20 MG tablet, Take 20 mg by mouth daily as needed. , Disp: , Rfl:  .  LORazepam (ATIVAN) 1 MG tablet, Take 1 mg by mouth 3 (three) times daily as needed., Disp: , Rfl:  .  magic mouthwash (nystatin, lidocaine, diphenhydrAMINE, alum & mag hydroxide) suspension, Swish and spit 5 mLs 3 (three) times daily as needed for mouth pain., Disp: 180 mL, Rfl: 1 .  metoCLOPramide (REGLAN) 10 MG tablet, Take 10 mg by mouth  daily. , Disp: , Rfl:  .  metoprolol succinate (TOPROL-XL) 25 MG 24 hr tablet, Take 25 mg by mouth daily., Disp: , Rfl:  .  nystatin (MYCOSTATIN) 100000 UNIT/ML suspension, Take 10 mLs  (1,000,000 Units total) by mouth 3 (three) times daily., Disp: 60 mL, Rfl: 2 .  ondansetron (ZOFRAN-ODT) 4 MG disintegrating tablet, Take 1 tablet (4 mg total) by mouth every 8 (eight) hours as needed for nausea or vomiting., Disp: 15 tablet, Rfl: 0 .  oxyCODONE-acetaminophen (PERCOCET) 7.5-325 MG tablet, as needed., Disp: , Rfl:  .  pantoprazole (PROTONIX) 40 MG tablet, Take 1 tablet (40 mg total) by mouth 2 (two) times daily., Disp: 60 tablet, Rfl: 1 .  Phenazopyridine HCl (PYRIDIUM PO), Take by mouth as needed., Disp: , Rfl:  .  Polyethyl Glycol-Propyl Glycol (SYSTANE) 0.4-0.3 % GEL ophthalmic gel, Place 1 application into both eyes daily as needed (dryness)., Disp: , Rfl:  .  potassium chloride (K-DUR) 10 MEQ tablet, Take 10 mEq by mouth daily as needed. , Disp: , Rfl:  .  pravastatin (PRAVACHOL) 40 MG tablet, Take 40 mg by mouth daily., Disp: , Rfl:  .  prednisoLONE acetate (PRED FORTE) 1 % ophthalmic suspension, Place 1 drop into the left eye as needed., Disp: , Rfl:  .  promethazine (PHENERGAN) 25 MG suppository, Place 1 suppository (25 mg total) rectally every 6 (six) hours as needed for nausea or vomiting., Disp: 12 each, Rfl: 0 .  Promethazine HCl (PHENERGAN PO), Take by mouth as needed., Disp: , Rfl:  .  SUMAtriptan (IMITREX) 25 MG tablet, Take 25 mg by mouth every 2 (two) hours as needed for migraine. May repeat in 2 hours if headache persists or recurs., Disp: , Rfl:  .  topiramate (TOPAMAX) 100 MG tablet, Take 100 mg by mouth daily., Disp: , Rfl:  .  topiramate (TOPAMAX) 25 MG tablet, Take 25 mg by mouth at bedtime. , Disp: , Rfl:  .  traZODone (DESYREL) 150 MG tablet, Take 150 mg by mouth at bedtime as needed for sleep., Disp: , Rfl:  .  triamcinolone lotion (KENALOG) 0.1 %, APPLY TOPICALLY TO SCALP AT BEDTIME AS DIRECTED, Disp: , Rfl:  .  valACYclovir (VALTREX) 1000 MG tablet, Take 1 tablet (1,000 mg total) by mouth daily., Disp: 30 tablet, Rfl: 5 .  cyclobenzaprine (FLEXERIL) 10 MG  tablet, Take 1 tablet by mouth 3 (three) times daily as needed., Disp: , Rfl:  .  famotidine (PEPCID) 40 MG tablet, Take 40 mg by mouth at bedtime as needed., Disp: , Rfl:      Review of Systems  Constitutional: Positive for fatigue. Negative for activity change, appetite change, chills, diaphoresis, fever and unexpected weight change.  HENT: Positive for sore throat. Negative for congestion, rhinorrhea, sinus pressure, sneezing and trouble swallowing.   Eyes: Negative for photophobia and visual disturbance.  Respiratory: Negative for cough, chest tightness, wheezing and stridor.   Cardiovascular: Negative for chest pain and palpitations.  Gastrointestinal: Negative for abdominal distention, abdominal pain, anal bleeding, blood in stool, constipation, nausea and vomiting.  Genitourinary: Negative for difficulty urinating, dysuria, flank pain and hematuria.  Musculoskeletal: Negative for arthralgias, back pain, gait problem and joint swelling.  Skin: Negative for color change, pallor and wound.  Neurological: Negative for dizziness, tremors, weakness and light-headedness.  Hematological: Negative for adenopathy. Does not bruise/bleed easily.  Psychiatric/Behavioral: Negative for agitation, behavioral problems, confusion, decreased concentration, dysphoric mood and sleep disturbance.       Objective:   Physical  Exam Constitutional:      General: She is not in acute distress.    Appearance: Normal appearance. She is well-developed. She is not ill-appearing or diaphoretic.  HENT:     Head: Normocephalic and atraumatic.     Right Ear: Hearing and external ear normal.     Left Ear: Hearing and external ear normal.     Nose: No nasal deformity or rhinorrhea.     Mouth/Throat:     Mouth: Mucous membranes are moist.     Pharynx: No posterior oropharyngeal erythema.  Eyes:     General: No scleral icterus.    Extraocular Movements: Extraocular movements intact.     Conjunctiva/sclera:  Conjunctivae normal.     Right eye: Right conjunctiva is not injected.     Left eye: Left conjunctiva is not injected.  Neck:     Vascular: No JVD.  Cardiovascular:     Rate and Rhythm: Normal rate and regular rhythm.     Heart sounds: S1 normal and S2 normal. No murmur heard. No gallop.   Pulmonary:     Effort: Pulmonary effort is normal. No respiratory distress.     Breath sounds: No stridor. No wheezing or rhonchi.  Abdominal:     General: Abdomen is flat. There is no distension.     Palpations: Abdomen is soft. There is no mass.  Musculoskeletal:        General: Normal range of motion.     Right shoulder: Normal.     Left shoulder: Normal.     Cervical back: Normal range of motion and neck supple.     Right hip: Normal.     Left hip: Normal.     Right knee: Normal.     Left knee: Normal.  Lymphadenopathy:     Head:     Right side of head: No submandibular, preauricular or posterior auricular adenopathy.     Left side of head: No submandibular, preauricular or posterior auricular adenopathy.     Cervical: No cervical adenopathy.     Right cervical: No superficial or deep cervical adenopathy.    Left cervical: No superficial or deep cervical adenopathy.  Skin:    General: Skin is warm and dry.     Coloration: Skin is not pale.     Findings: No abrasion, bruising, ecchymosis, erythema, lesion or rash.     Nails: There is no clubbing.  Neurological:     General: No focal deficit present.     Mental Status: She is alert and oriented to person, place, and time. Mental status is at baseline.     Sensory: No sensory deficit.     Coordination: Coordination normal.     Gait: Gait normal.  Psychiatric:        Attention and Perception: Attention and perception normal. She is attentive.        Mood and Affect: Mood is anxious.        Speech: Speech normal.        Behavior: Behavior normal. Behavior is cooperative.        Thought Content: Thought content normal.        Cognition  and Memory: Cognition and memory normal.        Judgment: Judgment normal.    Op lesions from photograph she showed me several visits ago.    ]  Tongue  August 12, 2020:    Lips when seen by Dr. 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    #2 she has a different diagnosis such as geographic tongue + stomatitis that is NOT due to candida but which waxes and wanes with treatment  If #1 were true I would have expected her to be developing azole R at this point  She has seen [facility].  Work-up is underway there.  Her immunologist request I will check cultures of the throat I will check bacterial cultures to see if there really is significant ease that over grows the bacteria though I doubt that will happen.  Fungal cultures are undoubtedly going to grow Candida as I would expect to be the case with any person who we sampled her oropharynx with a selective media.  Of note last time she had similar symptoms and we did swabs in early April and  we did not find any fungal organisms whatsoever.  Giving IVIG has made ZERO impact on her oral lesions  Stopping the methotrexate may have had an effect   Dr Vickki Muff was thgouht this was possible but also thought   re-challenge with MTX might be more informative  He was also supportive of resuming immunosuppression in  general.  As mentioned I sent swabs again today for cultures appearance of the lesions is again not consistent with a fungal infection in my opinion.   If she wants to take nystatin for this but I do not think giving her azole therapy is in order.  Perhaps also seeing ENT or Rheumatology at [facility] might be helpful now that she has been seeing Immunology and ID as well--not to transplant her care there but perhaps to pin down what is causing this with greater certainty.  She is continuing to follow closely with Dr. Gypsy Lore here and who is following her very closely.    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

## 2021-05-11 ENCOUNTER — Ambulatory Visit (INDEPENDENT_AMBULATORY_CARE_PROVIDER_SITE_OTHER): Payer: 59 | Admitting: Otolaryngology

## 2021-05-11 ENCOUNTER — Other Ambulatory Visit (INDEPENDENT_AMBULATORY_CARE_PROVIDER_SITE_OTHER): Payer: Self-pay

## 2021-05-11 ENCOUNTER — Other Ambulatory Visit: Payer: Self-pay

## 2021-05-11 ENCOUNTER — Other Ambulatory Visit (HOSPITAL_COMMUNITY)
Admission: RE | Admit: 2021-05-11 | Discharge: 2021-05-11 | Disposition: A | Discharge status: Home / Self Care | Payer: 59 | Source: Ambulatory Visit | Attending: Otolaryngology | Admitting: Otolaryngology

## 2021-05-11 VITALS — Temp 95.5°F

## 2021-05-11 DIAGNOSIS — K1370 Unspecified lesions of oral mucosa: Secondary | ICD-10-CM

## 2021-05-11 NOTE — Progress Notes (Signed)
p 

## 2021-05-11 NOTE — Progress Notes (Signed)
HPI: [name] Horn is a 45 y.o. female who returns today for evaluation of recurrent oral candidiasis.  Patient apparently has seen infectious disease at [facility].  They recommended obtaining a biopsy whenever she develops the oral candidiasis.  She has had some discomfort of the buccal mucosa on both sides lower on the buccal mucosa and presents today for biopsy.  On exam she has what appears to be candidiasis with white plaques which are slightly raised and mild erythema involving the lower buccal mucosa on both sides.  She generally eats on the right side and would prefer the biopsy to be performed on the left side. She also inquires about some residual tonsil tissue that she has on the lower left oropharyngeal mucosa as well as visualization of her of epiglottis that she has not noticed previously..  Past Medical History:  Diagnosis Date  . Complication of anesthesia    " i TAKE A LIITLE BIT LONGER TO [place] UP "  . DVT (deep venous thrombosis) ([place])    x2  . Fatigue 12/30/2020  . Gastroparesis 08/07/2019  . 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 Herpes labialis 08/12/2020  . Intertrigo 06/22/2020  . Lymphedema   . Malaise 12/30/2020  . RA (rheumatoid arthritis) ([place])   . Rheumatoid arthritis ([place]) 08/12/2020  . Transaminitis 08/12/2020  . Uterine cancer [facility])    Past Surgical History:  Procedure Laterality Date  . APPENDECTOMY    . cheek biopsy  11/2020  . CHOLECYSTECTOMY    . ESOPHAGOGASTRODUODENOSCOPY N/A 04/30/2017   Procedure: ESOPHAGOGASTRODUODENOSCOPY (EGD);  Surgeon: Teena Irani, MD;  Location: [facility] ENDOSCOPY;  Service: Endoscopy;  Laterality: N/A;  . HERNIA REPAIR     x2  . KNEE SURGERY     x3  . MINIMALLY INVASIVE FORAMINOTOMY CERVICAL SPINE     C6-T1, Nitka  . SAVORY DILATION N/A 04/30/2017   Procedure: SAVORY DILATION;  Surgeon: Teena Irani, MD;  Location: [facility] ENDOSCOPY;  Service: Endoscopy;  Laterality: N/A;  . TONSILLECTOMY    . TOTAL ABDOMINAL HYSTERECTOMY     Sarcoma, s/p XRT    Social History   Socioeconomic History  . Marital status: Married    Spouse name: Not on file  . Number of children: Not on file  . Years of education: Not on file  . Highest education level: Not on file  Occupational History  . Not on file  Tobacco Use  . Smoking status: Passive Smoke Exposure - Never Smoker  . Smokeless tobacco: Never Used  Vaping Use  . Vaping Use: Never used  Substance and Sexual Activity  . Alcohol use: Not Currently  . Drug use: No  . Sexual activity: Yes    Partners: Male    Birth control/protection: None  Other Topics Concern  . Not on file  Social History Narrative  . Not on file   Social Determinants of Health   Financial Resource Strain: Not on file  Food Insecurity: Not on file  Transportation Needs: Not on file  Physical Activity: Not on file  Stress: Not on file  Social Connections: Not on file   Family History  Problem Relation Age of Onset  . Hypertension Other   . Hyperlipidemia Other   . Colon cancer Other        grandmother  . Hashimoto's thyroiditis Mother   . Eczema Mother   . Angioedema Mother   . Throat cancer Father   . Arrhythmia Father   . Angioedema Maternal Grandfather    Allergies  Allergen Reactions  . Other     Dermabond surgical glue.   . Sulfa Antibiotics Anaphylaxis, Shortness Of Breath, Swelling and Rash    Angioedema (also)  . Sulfonamide Derivatives Hives, Shortness Of Breath and Swelling    TONGUE SWELLS  . Benadryl [Diphenhydramine Hcl] Other (See Comments)    Hyperactivity    Prior to Admission medications   Medication Sig Start Date End Date Taking? Authorizing Provider  amphetamine-dextroamphetamine (ADDERALL) 20 MG tablet Take 20 mg by mouth 3 (three) times daily.    [provider]  atropine 1 % ophthalmic solution Place 1 drop into the left eye daily as needed (for dry eye).    [provider]  baclofen (LIORESAL) 10 MG tablet Take 10 mg by mouth daily. 02/28/20    [provider]  Bepotastine Besilate 1.5 % SOLN Place 1 drop into both eyes 2 (two) times a day.    [provider]  cyclobenzaprine (FLEXERIL) 10 MG tablet Take 1 tablet by mouth 3 (three) times daily as needed. 05/05/21   [provider]  cycloSPORINE (RESTASIS) 0.05 % ophthalmic emulsion Place 1 drop into both eyes 2 (two) times daily.    [provider]  dicyclomine (BENTYL) 10 MG capsule Take 10 mg by mouth daily as needed for nausea/vomiting. 01/02/19   [provider]  famotidine (PEPCID) 40 MG tablet Take 40 mg by mouth at bedtime as needed. 05/02/21   [provider]  folic acid (FOLVITE) 1 MG tablet Take 2 tablets (2 mg total) by mouth daily. 10/30/19   Bo Merino, MD  furosemide (LASIX) 20 MG tablet Take 20 mg by mouth daily as needed.     [provider]  LORazepam (ATIVAN) 1 MG tablet Take 1 mg by mouth 3 (three) times daily as needed. 03/08/20   [provider]  magic mouthwash (nystatin, lidocaine, diphenhydrAMINE, alum & mag hydroxide) suspension Swish and spit 5 mLs 3 (three) times daily as needed for mouth pain. 04/27/21   Campbell Riches, MD  metoCLOPramide (REGLAN) 10 MG tablet Take 10 mg by mouth daily.     [provider]  metoprolol succinate (TOPROL-XL) 25 MG 24 hr tablet Take 25 mg by mouth daily.    [provider]  nystatin (MYCOSTATIN) 100000 UNIT/ML suspension Take 10 mLs (1,000,000 Units total) by mouth 3 (three) times daily. 12/07/20   Truman Hayward, MD  ondansetron (ZOFRAN-ODT) 4 MG disintegrating tablet Take 1 tablet (4 mg total) by mouth every 8 (eight) hours as needed for nausea or vomiting. 08/05/19   Charlann Lange, PA-C  oxyCODONE-acetaminophen (PERCOCET) 7.5-325 MG tablet as needed. 03/31/20   [provider]  pantoprazole (PROTONIX) 40 MG tablet Take 1 tablet (40 mg total) by mouth 2 (two) times daily. 05/02/17 08/04/28  Elgergawy, Silver Huguenin, MD  Phenazopyridine  HCl (PYRIDIUM PO) Take by mouth as needed.    [provider]  Polyethyl Glycol-Propyl Glycol (SYSTANE) 0.4-0.3 % GEL ophthalmic gel Place 1 application into both eyes daily as needed (dryness).    [provider]  potassium chloride (K-DUR) 10 MEQ tablet Take 10 mEq by mouth daily as needed.     [provider]  pravastatin (PRAVACHOL) 40 MG tablet Take 40 mg by mouth daily.    [provider]  prednisoLONE acetate (PRED FORTE) 1 % ophthalmic suspension Place 1 drop into the left eye as needed.    [provider]  promethazine (PHENERGAN) 25 MG suppository Place 1  suppository (25 mg total) rectally every 6 (six) hours as needed for nausea or vomiting. 08/05/19   Charlann Lange, PA-C  Promethazine HCl (PHENERGAN PO) Take by mouth as needed.    [provider]  SUMAtriptan (IMITREX) 25 MG tablet Take 25 mg by mouth every 2 (two) hours as needed for migraine. May repeat in 2 hours if headache persists or recurs.    [provider]  topiramate (TOPAMAX) 100 MG tablet Take 100 mg by mouth daily. 06/18/19   [provider]  topiramate (TOPAMAX) 25 MG tablet Take 25 mg by mouth at bedtime.     [provider]  traZODone (DESYREL) 150 MG tablet Take 150 mg by mouth at bedtime as needed for sleep.    [provider]  triamcinolone lotion (KENALOG) 0.1 % APPLY TOPICALLY TO SCALP AT BEDTIME AS DIRECTED 07/07/20   [provider]  valACYclovir (VALTREX) 1000 MG tablet Take 1 tablet (1,000 mg total) by mouth daily. 08/12/20   Truman Hayward, MD     Positive ROS: Otherwise negative  All other systems have been reviewed and were otherwise negative with the exception of those mentioned in the HPI and as above.  Physical Exam: Constitutional: Alert, well-appearing, no acute distress Ears: External ears without lesions or tenderness. Ear canals are clear bilaterally with intact, clear TMs.  Nasal: External nose  without lesions. . Clear nasal passages Oral: Lips and gums without lesions. Tongue and palate mucosa without lesions. Posterior oropharynx clear.  She has minimal residual tonsil tissue which is normal in appearance.  Epiglottis likewise can be visualized but is normal in appearance as is the base of tongue.  I note she does have white plaques and mild erythema involving the lower buccal mucosa on both sides. Neck: No palpable adenopathy or masses Respiratory: Breathing comfortably  Skin: No facial/neck lesions or rash noted.  Procedure: A biopsy of the left lower buccal mucosa was obtained in the office today.  The proposed incision site was injected with 2 cc of Xylocaine with epinephrine.  Using scissors mucosal was excised from the left lower buccal mucosa and sent to pathology to rule out autoimmune disease process versus infectious disease process.  Hemostasis was obtained with silver nitrate and pressure with a 4 x 4 gauze.  She tolerated this well with minimal bleeding.  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  Plan: A biopsy was obtained in the office from the left lower buccal mucosa and sent to pathology to rule out autoimmune disease versus infectious process such as fungus or Candida.   Radene Journey, MD

## 2021-05-12 ENCOUNTER — Other Ambulatory Visit (INDEPENDENT_AMBULATORY_CARE_PROVIDER_SITE_OTHER): Payer: Self-pay

## 2021-05-12 ENCOUNTER — Telehealth (INDEPENDENT_AMBULATORY_CARE_PROVIDER_SITE_OTHER): Payer: Self-pay

## 2021-05-12 MED ORDER — AMOXICILLIN-POT CLAVULANATE 875-125 MG PO TABS: 1.0000 | ORAL_TABLET | Freq: Two times a day (BID) | ORAL | 0 refills | Status: DC

## 2021-05-12 NOTE — Progress Notes (Signed)
Amox

## 2021-05-12 NOTE — Telephone Encounter (Signed)
Pt called in running fever, feels it could be from biopsy taken yesterday. Per Dr. Lucia Gaskins I sent in Amoxicillin 875 bid x 1 week. Marland Kitchen

## 2021-05-13 LAB — SURGICAL PATHOLOGY

## 2021-05-14 LAB — FUNGUS CULTURE W SMEAR
MICRO NUMBER:: 11890890
SPECIMEN QUALITY:: ADEQUATE

## 2021-05-14 LAB — CULTURE, GROUP A STREP
MICRO NUMBER:: 11889736
SPECIMEN QUALITY:: ADEQUATE

## 2021-05-20 ENCOUNTER — Inpatient Hospital Stay (HOSPITAL_COMMUNITY): Admission: RE | Admit: 2021-05-20 | Payer: 59 | Source: Ambulatory Visit

## 2021-06-02 ENCOUNTER — Telehealth: Payer: Self-pay | Admitting: *Deleted

## 2021-06-02 NOTE — Telephone Encounter (Signed)
Patient scheduled IVIG infusion appointment for 06/03/21 (requested appointment on 05/05/21). Per Erasmo Downer at [facility], patient's orders expired - they were only for 2 doses, ended in May 2022. Please advise if patient should continue IVIG. If so, will need orders 06/02/21 for infusion 06/03/21. Landis Gandy, RN

## 2021-06-03 ENCOUNTER — Other Ambulatory Visit: Payer: Self-pay

## 2021-06-03 ENCOUNTER — Ambulatory Visit (HOSPITAL_COMMUNITY)
Admission: RE | Admit: 2021-06-03 | Discharge: 2021-06-03 | Disposition: A | Discharge status: Home / Self Care | Payer: 59 | Source: Ambulatory Visit | Attending: Infectious Disease | Admitting: Infectious Disease

## 2021-06-03 DIAGNOSIS — D808 Other immunodeficiencies with predominantly antibody defects: Secondary | ICD-10-CM | POA: Diagnosis not present

## 2021-06-03 MED ORDER — IMMUNE GLOBULIN (HUMAN) 10 GM/100ML IV SOLN
32000.0000 mg | INTRAVENOUS | Status: DC
  Administered 2021-06-03: 11:00:00 30 g via INTRAVENOUS
  Filled 2021-06-03: qty 200

## 2021-06-03 NOTE — Telephone Encounter (Signed)
Order has been faxed to short stay. [name] Horn T Brooks Sailors

## 2021-06-09 ENCOUNTER — Telehealth: Payer: Self-pay

## 2021-06-09 DIAGNOSIS — B370 Candidal stomatitis: Secondary | ICD-10-CM

## 2021-06-09 NOTE — Telephone Encounter (Signed)
Patient called, feels like she is having a recurrence on her cheeks and lips. Is requesting refill for magic mouthwash. Will route to provider.   Beryle Flock, RN

## 2021-06-10 MED ORDER — NYSTATIN 100000 UNIT/ML MT SUSP: 5.0000 mL | Freq: Three times a day (TID) | OROMUCOSAL | 1 refills | Status: DC | PRN

## 2021-06-10 NOTE — Addendum Note (Signed)
Addended by: Lucie Leather D on: 06/10/2021 09:23 AM   Modules accepted: Orders

## 2021-06-10 NOTE — Telephone Encounter (Signed)
Called patient to let her know refill has been sent, no answer. Will send MyChart message.   Beryle Flock, RN

## 2021-06-14 ENCOUNTER — Telehealth: Payer: Self-pay | Admitting: *Deleted

## 2021-06-14 NOTE — Telephone Encounter (Signed)
[facility] called to confirm magic mouthwash 1:1:1:1 dosing as they usually compound.  No special instructions on prescription, RN asked for [facility] usual compounding formula. Patient also picked up nystatin swish last week. This was prescribed by her pcp [facility]) last year, still had refills on it. Please advise if the magic mouthwash should be a different formulation and if it should be held due to the recent nystatin refill. Landis Gandy, RN

## 2021-06-14 NOTE — Telephone Encounter (Signed)
Can leave things as is....  Thanks Palma Holter be able to absorb nystatin so i am not worried  Thanks

## 2021-06-22 ENCOUNTER — Encounter (HOSPITAL_COMMUNITY): Payer: Self-pay | Admitting: Emergency Medicine

## 2021-06-22 ENCOUNTER — Other Ambulatory Visit: Payer: Self-pay

## 2021-06-22 ENCOUNTER — Emergency Department (HOSPITAL_COMMUNITY): Payer: 59

## 2021-06-22 ENCOUNTER — Emergency Department (HOSPITAL_COMMUNITY)
Admission: EM | Admit: 2021-06-22 | Discharge: 2021-06-23 | Disposition: A | Discharge status: Home / Self Care | Payer: 59 | Attending: Emergency Medicine | Admitting: Emergency Medicine

## 2021-06-22 DIAGNOSIS — J45909 Unspecified asthma, uncomplicated: Secondary | ICD-10-CM | POA: Diagnosis not present

## 2021-06-22 DIAGNOSIS — Z7722 Contact with and (suspected) exposure to environmental tobacco smoke (acute) (chronic): Secondary | ICD-10-CM | POA: Diagnosis not present

## 2021-06-22 DIAGNOSIS — I82409 Acute embolism and thrombosis of unspecified deep veins of unspecified lower extremity: Secondary | ICD-10-CM | POA: Diagnosis not present

## 2021-06-22 DIAGNOSIS — R000 Tachycardia, unspecified: Secondary | ICD-10-CM | POA: Diagnosis not present

## 2021-06-22 DIAGNOSIS — Z8542 Personal history of malignant neoplasm of other parts of uterus: Secondary | ICD-10-CM | POA: Diagnosis not present

## 2021-06-22 DIAGNOSIS — U071 COVID-19: Secondary | ICD-10-CM | POA: Diagnosis not present

## 2021-06-22 DIAGNOSIS — R059 Cough, unspecified: Secondary | ICD-10-CM | POA: Diagnosis present

## 2021-06-22 LAB — PROTIME-INR
INR: 1.1 (ref 0.8–1.2)
Prothrombin Time: 14.2 seconds (ref 11.4–15.2)

## 2021-06-22 LAB — CBC WITH DIFFERENTIAL/PLATELET
Abs Immature Granulocytes: 0.02 10*3/uL (ref 0.00–0.07)
Basophils Absolute: 0 10*3/uL (ref 0.0–0.1)
Basophils Relative: 1 %
Eosinophils Absolute: 0 10*3/uL (ref 0.0–0.5)
Eosinophils Relative: 1 %
HCT: 36.3 % (ref 36.0–46.0)
Hemoglobin: 12.4 g/dL (ref 12.0–15.0)
Immature Granulocytes: 0 %
Lymphocytes Relative: 16 %
Lymphs Abs: 1 10*3/uL (ref 0.7–4.0)
MCH: 32.4 pg (ref 26.0–34.0)
MCHC: 34.2 g/dL (ref 30.0–36.0)
MCV: 94.8 fL (ref 80.0–100.0)
Monocytes Absolute: 0.7 10*3/uL (ref 0.1–1.0)
Monocytes Relative: 11 %
Neutro Abs: 4.6 10*3/uL (ref 1.7–7.7)
Neutrophils Relative %: 71 %
Platelets: 218 10*3/uL (ref 150–400)
RBC: 3.83 MIL/uL — ABNORMAL LOW (ref 3.87–5.11)
RDW: 12.9 % (ref 11.5–15.5)
WBC: 6.4 10*3/uL (ref 4.0–10.5)
nRBC: 0 % (ref 0.0–0.2)

## 2021-06-22 LAB — COMPREHENSIVE METABOLIC PANEL
ALT: 14 U/L (ref 0–44)
AST: 32 U/L (ref 15–41)
Albumin: 3.3 g/dL — ABNORMAL LOW (ref 3.5–5.0)
Alkaline Phosphatase: 67 U/L (ref 38–126)
Anion gap: 9 (ref 5–15)
BUN: 10 mg/dL (ref 6–20)
CO2: 18 mmol/L — ABNORMAL LOW (ref 22–32)
Calcium: 9 mg/dL (ref 8.9–10.3)
Chloride: 111 mmol/L (ref 98–111)
Creatinine, Ser: 0.83 mg/dL (ref 0.44–1.00)
GFR, Estimated: >60 mL/min (ref >60)
Glucose, Bld: 106 mg/dL — ABNORMAL HIGH (ref 70–99)
Potassium: 3.6 mmol/L (ref 3.5–5.1)
Sodium: 138 mmol/L (ref 135–145)
Total Bilirubin: 0.6 mg/dL (ref 0.3–1.2)
Total Protein: 6.6 g/dL (ref 6.5–8.1)

## 2021-06-22 LAB — TROPONIN I (HIGH SENSITIVITY)
Troponin I (High Sensitivity): 13 ng/L (ref <18)
Troponin I (High Sensitivity): 4 ng/L (ref <18)

## 2021-06-22 LAB — RESP PANEL BY RT-PCR (FLU A&B, COVID) ARPGX2
Influenza A by PCR: NEGATIVE
Influenza B by PCR: NEGATIVE
SARS Coronavirus 2 by RT PCR: POSITIVE — AB

## 2021-06-22 LAB — APTT: aPTT: 31 seconds (ref 24–36)

## 2021-06-22 LAB — LACTIC ACID, PLASMA: Lactic Acid, Venous: 1.1 mmol/L (ref 0.5–1.9)

## 2021-06-22 LAB — D-DIMER, QUANTITATIVE: D-Dimer, Quant: 1.03 ug/mL-FEU — ABNORMAL HIGH (ref 0.00–0.50)

## 2021-06-22 MED ORDER — ONDANSETRON HCL 4 MG/2ML IJ SOLN
4.0000 mg | Freq: Once | INTRAMUSCULAR | Status: AC
  Administered 2021-06-22: 4 mg via INTRAVENOUS
  Filled 2021-06-22: qty 2

## 2021-06-22 MED ORDER — MORPHINE SULFATE (PF) 4 MG/ML IV SOLN
4.0000 mg | Freq: Once | INTRAVENOUS | Status: AC
  Administered 2021-06-22: 4 mg via INTRAVENOUS
  Filled 2021-06-22: qty 1

## 2021-06-22 MED ORDER — BENZONATATE 100 MG PO CAPS
100.0000 mg | ORAL_CAPSULE | Freq: Once | ORAL | Status: AC
  Administered 2021-06-23: 100 mg via ORAL
  Filled 2021-06-22: qty 1

## 2021-06-22 MED ORDER — IOHEXOL 350 MG/ML SOLN
60.0000 mL | Freq: Once | INTRAVENOUS | Status: AC | PRN
  Administered 2021-06-22: 60 mL via INTRAVENOUS

## 2021-06-22 MED ORDER — SODIUM CHLORIDE 0.9 % IV SOLN: INTRAVENOUS | Status: DC | PRN

## 2021-06-22 MED ORDER — LACTATED RINGERS IV BOLUS (SEPSIS)
1000.0000 mL | Freq: Once | INTRAVENOUS | Status: AC
  Administered 2021-06-22: 1000 mL via INTRAVENOUS

## 2021-06-22 MED ORDER — ACETAMINOPHEN 325 MG PO TABS
650.0000 mg | ORAL_TABLET | Freq: Once | ORAL | Status: AC
  Administered 2021-06-22: 16:00:00 650 mg via ORAL
  Filled 2021-06-22: qty 2

## 2021-06-22 MED ORDER — BEBTELOVIMAB 175 MG/2 ML IV (EUA)
175.0000 mg | Freq: Once | INTRAMUSCULAR | Status: AC
  Administered 2021-06-22: 175 mg via INTRAVENOUS
  Filled 2021-06-22: qty 2

## 2021-06-22 MED ORDER — METHYLPREDNISOLONE SODIUM SUCC 125 MG IJ SOLR: 125.0000 mg | Freq: Once | INTRAMUSCULAR | Status: DC | PRN

## 2021-06-22 MED ORDER — EPINEPHRINE 0.3 MG/0.3ML IJ SOAJ: 0.3000 mg | Freq: Once | INTRAMUSCULAR | Status: DC | PRN

## 2021-06-22 MED ORDER — ALBUTEROL SULFATE HFA 108 (90 BASE) MCG/ACT IN AERS: 2.0000 | INHALATION_SPRAY | Freq: Once | RESPIRATORY_TRACT | Status: DC | PRN

## 2021-06-22 MED ORDER — BENZONATATE 100 MG PO CAPS: 100.0000 mg | ORAL_CAPSULE | Freq: Three times a day (TID) | ORAL | 0 refills | Status: DC | PRN

## 2021-06-22 MED ORDER — FAMOTIDINE IN NACL 20-0.9 MG/50ML-% IV SOLN: 20.0000 mg | Freq: Once | INTRAVENOUS | Status: DC | PRN

## 2021-06-22 NOTE — ED Notes (Signed)
Pt ambulated without difficulty. Spo2 remained 98%-100%. HR remained 115-122. Pt complaining of slight dizziness when walking.

## 2021-06-22 NOTE — ED Provider Notes (Signed)
Emergency Medicine Provider Triage Evaluation Note  [name] Horn , a 45 y.o. female  was evaluated in triage.  Pt complains of cough, sob, fever.  Review of Systems  Positive: Cough, sob, fever Negative: diarrhea  Physical Exam  BP (!) 109/91   Pulse (!) 127   Temp (!) 100.9 F (38.3 C)   Resp 20   SpO2 100%  Gen:   Awake, no distress   Resp:  Normal effort  MSK:   Moves extremities without difficulty Other:  Wheezing noted, no tachypnea. Sounds congested  Medical Decision Making  Medically screening exam initiated at 3:41 PM.  Appropriate orders placed.  [name] Horn was informed that the remainder of the evaluation will be completed by another provider, this initial triage assessment does not replace that evaluation, and the importance of remaining in the ED until their evaluation is complete.     Bishop Dublin 06/22/21 1541    Blanchie Dessert, MD 06/24/21 1226

## 2021-06-22 NOTE — ED Triage Notes (Signed)
Reports SOB since yesterday with productive cough, chest pain with coughing, and fever.  Reports black phlegm today.  Vomited after coughing.  Pt has a loop recorder.  Has not been COVID tested.

## 2021-06-22 NOTE — Discharge Instructions (Addendum)
You tested positive for covid today. You will need to quarantine for the next 5 days. If you are continuing to have fever on day 5 you will need to quarantine for another 5 days.  -Prescription sent to pharmacy for Tessalon.  This is a medicine to help with your cough.  Take as prescribed if needed.  Covid is a virus and there are no antibiotics needed. You can take tylenol and ibuprofen for pain at home.  Follow up with your primary care doctor if you continue to have symptoms. Call the office to let them know you have covid prior to going into the office.  Return to the emergency department for any new or worsening symptoms.

## 2021-06-22 NOTE — ED Provider Notes (Signed)
[facility] EMERGENCY DEPARTMENT Provider Note   CSN: [id number] Arrival date & time: 06/22/21  1442     History Chief Complaint  Patient presents with   Cough   Fever     [name] Horn is a 45 y.o. female with past medical history significant for DVT, gastroparesis, lymphedema, RA, uterine cancer.  Not currently on immunosuppressive medications.  HPI Patient presents to emergency room today with chief complaint of nonproductive cough, shortness of breath, subjective fever and generalized body aches x5 days.  She states her husband had similar symptoms last week.  [place] for [employer] [place] and [place] exposed to illnesses she reports.  He did not have a COVID test during his illness.  Patient admits to having both COVID vaccinations and booster.  She states her symptoms have been progressively worsening.  She is also complaining of chest pain and nausea.  She states it is located in the middle of her chest and feels like a soreness.  Pain is worse after coughing.  Chest pain does not radiate.  Patient admits to an episode of emesis yesterday in her vomit was black in color.  Patient denies any history of alcohol use or frequent NSAID use.  Patient has not been taking any medication for symptoms at home prior to coming in today.  She states she has been feeling short of breath for the last month, has been worse since her illness started this time.  She denies any shortness of breath or chest pain with activity or exertion.  Patient admits to having a loop recorder. She denies any hemoptysis, abdominal pain, back pain, gross hematuria, urinary frequency, diarrhea.   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   Herpes labialis 08/12/2020   Intertrigo 06/22/2020    Lymphedema    Malaise 12/30/2020   RA (rheumatoid arthritis) (HCC)    Rheumatoid arthritis ([place]) 08/12/2020   Transaminitis 08/12/2020   Uterine cancer [facility])     Patient Active Problem List   Diagnosis Date Noted   Fatigue 12/30/2020   Malaise 12/30/2020   FUO (fever of unknown origin) 10/05/2020   Stomatitis 09/03/2020   Rheumatoid arthritis ([place]) 08/12/2020   Herpes labialis 08/12/2020   Transaminitis 08/12/2020   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   Intertrigo 06/22/2020   Interstitial cystitis 05/18/2020   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   Chronic rhinitis 04/23/2020   Adverse food reaction 76/16/0737   Eosinophilic esophagitis 10/62/6948   Oral candidiasis 04/02/2020   History of Clostridium difficile infection 04/02/2020   History of shingles 04/02/2020   History of asthma 04/02/2020   Multiple drug allergies 04/02/2020   Environmental allergies 04/02/2020   History of loop recorder 01/19/2020   PVC (premature ventricular contraction) 01/19/2020   Uterine cancer ([place]) 01/19/2020   Colitis 08/14/2019   Constipation    Gastroparesis 04/03/2019   Palpitations 04/10/2018   Nausea and vomiting 04/26/2017   Nausea & vomiting 04/25/2017   Chest pain 04/25/2017   Enterocolitis due to Clostridium difficile 01/04/2016   Incisional hernia without obstruction or gangrene 11/18/2015   Personal history of other diseases of the nervous system and sense organs  10/14/2015   Disease of pancreas 01/21/2015   Other specified bacterial agents as the cause of diseases classified elsewhere 01/21/2015   Radiculopathy of cervical region 12/09/2014   Annual physical exam 01/30/2012   Melena 01/30/2012   Pain in ankle 11/24/2010   ANXIETY 10/11/2010   DVT 10/11/2010   COSTOCHONDRITIS 03/03/2008   Cellulitis of toe 12/01/2007   MENOPAUSE, PREMATURE 07/20/2007   UTERINE CANCER,  HX OF 07/16/2007   Other postprocedural status(V45.89) 07/16/2007    Past Surgical History:  Procedure Laterality Date   APPENDECTOMY     cheek biopsy  11/2020   CHOLECYSTECTOMY     ESOPHAGOGASTRODUODENOSCOPY N/A 04/30/2017   Procedure: ESOPHAGOGASTRODUODENOSCOPY (EGD);  Surgeon: Teena Irani, MD;  Location: [facility] ENDOSCOPY;  Service: Endoscopy;  Laterality: N/A;   HERNIA REPAIR     x2   KNEE SURGERY     x3   MINIMALLY INVASIVE FORAMINOTOMY CERVICAL SPINE     C6-T1, Nitka   SAVORY DILATION N/A 04/30/2017   Procedure: SAVORY DILATION;  Surgeon: Teena Irani, MD;  Location: [place];  Service: Endoscopy;  Laterality: N/A;   TONSILLECTOMY     TOTAL ABDOMINAL HYSTERECTOMY     Sarcoma, s/p XRT     OB History   No obstetric history on file.     Family History  Problem Relation Age of Onset   Hypertension Other    Hyperlipidemia Other    Colon cancer Other        grandmother   Hashimoto's thyroiditis Mother    Eczema Mother    Angioedema Mother    Throat cancer Father    Arrhythmia Father    Angioedema Maternal Grandfather     Social History   Tobacco Use   Smoking status: Passive Smoke Exposure - Never Smoker   Smokeless tobacco: Never  Vaping Use   Vaping Use: Never used  Substance Use Topics   Alcohol use: Not Currently   Drug use: No    Home Medications Prior to Admission medications   Medication Sig Start Date End Date Taking? Authorizing Provider  benzonatate (TESSALON) 100 MG capsule Take 1 capsule (100 mg total) by mouth every 8 (eight) hours as needed for cough. 06/22/21  Yes Walisiewicz, Katrell Milhorn E, PA-C  amoxicillin-clavulanate (AUGMENTIN) 875-125 MG tablet Take 1 tablet by mouth 2 (two) times daily. 05/12/21   Rozetta Nunnery, MD  amphetamine-dextroamphetamine (ADDERALL) 20 MG tablet Take 20 mg by mouth 3 (three) times daily.    [provider]  atropine 1 % ophthalmic solution Place 1 drop into the left eye daily as needed (for dry eye).     [provider]  baclofen (LIORESAL) 10 MG tablet Take 10 mg by mouth daily. 02/28/20   [provider]  Bepotastine Besilate 1.5 % SOLN Place 1 drop into both eyes 2 (two) times a day.    [provider]  cyclobenzaprine (FLEXERIL) 10 MG tablet Take 1 tablet by mouth 3 (three) times daily as needed. 05/05/21   [provider]  cycloSPORINE (RESTASIS) 0.05 % ophthalmic emulsion Place 1 drop into both eyes 2 (two) times daily.    [provider]  dicyclomine (BENTYL) 10 MG capsule Take 10 mg by mouth daily as needed for nausea/vomiting. 01/02/19   [provider]  famotidine (PEPCID) 40 MG tablet Take 40 mg by mouth at bedtime as needed. 05/02/21   [provider]  folic acid (FOLVITE) 1 MG tablet Take 2 tablets (2 mg total) by mouth daily. 10/30/19  Bo Merino, MD  furosemide (LASIX) 20 MG tablet Take 20 mg by mouth daily as needed.     [provider]  LORazepam (ATIVAN) 1 MG tablet Take 1 mg by mouth 3 (three) times daily as needed. 03/08/20   [provider]  magic mouthwash (nystatin, lidocaine, diphenhydrAMINE, alum & mag hydroxide) suspension Swish and spit 5 mLs 3 (three) times daily as needed for mouth pain. 06/10/21   Truman Hayward, MD  metoCLOPramide (REGLAN) 10 MG tablet Take 10 mg by mouth daily.     [provider]  metoprolol succinate (TOPROL-XL) 25 MG 24 hr tablet Take 25 mg by mouth daily.    [provider]  nystatin (MYCOSTATIN) 100000 UNIT/ML suspension Take 10 mLs (1,000,000 Units total) by mouth 3 (three) times daily. 12/07/20   Truman Hayward, MD  ondansetron (ZOFRAN-ODT) 4 MG disintegrating tablet Take 1 tablet (4 mg total) by mouth every 8 (eight) hours as needed for nausea or vomiting. 08/05/19   Charlann Lange, PA-C  oxyCODONE-acetaminophen (PERCOCET) 7.5-325 MG tablet as needed. 03/31/20   [provider]  pantoprazole (PROTONIX) 40 MG tablet Take 1 tablet  (40 mg total) by mouth 2 (two) times daily. 05/02/17 08/04/28  Elgergawy, Silver Huguenin, MD  Phenazopyridine HCl (PYRIDIUM PO) Take by mouth as needed.    [provider]  Polyethyl Glycol-Propyl Glycol (SYSTANE) 0.4-0.3 % GEL ophthalmic gel Place 1 application into both eyes daily as needed (dryness).    [provider]  potassium chloride (K-DUR) 10 MEQ tablet Take 10 mEq by mouth daily as needed.     [provider]  pravastatin (PRAVACHOL) 40 MG tablet Take 40 mg by mouth daily.    [provider]  prednisoLONE acetate (PRED FORTE) 1 % ophthalmic suspension Place 1 drop into the left eye as needed.    [provider]  promethazine (PHENERGAN) 25 MG suppository Place 1 suppository (25 mg total) rectally every 6 (six) hours as needed for nausea or vomiting. 08/05/19   Charlann Lange, PA-C  Promethazine HCl (PHENERGAN PO) Take by mouth as needed.    [provider]  SUMAtriptan (IMITREX) 25 MG tablet Take 25 mg by mouth every 2 (two) hours as needed for migraine. May repeat in 2 hours if headache persists or recurs.    [provider]  topiramate (TOPAMAX) 100 MG tablet Take 100 mg by mouth daily. 06/18/19   [provider]  topiramate (TOPAMAX) 25 MG tablet Take 25 mg by mouth at bedtime.     [provider]  traZODone (DESYREL) 150 MG tablet Take 150 mg by mouth at bedtime as needed for sleep.    [provider]  triamcinolone lotion (KENALOG) 0.1 % APPLY TOPICALLY TO SCALP AT BEDTIME AS DIRECTED 07/07/20   [provider]  valACYclovir (VALTREX) 1000 MG tablet Take 1 tablet (1,000 mg total) by mouth daily. 08/12/20   Truman Hayward, MD    Allergies    Other, Sulfa antibiotics, Sulfonamide derivatives, and Benadryl [diphenhydramine hcl]  Review of Systems   Review of Systems All other systems are reviewed and are negative for acute change except as noted in the HPI.  Physical Exam Updated Vital  Signs BP 116/79   Pulse (!) 125   Temp 100.1 F (37.8 C) (Oral)   Resp 15   Ht 5\' 4"  (1.626 m)   Wt 77.7 kg   SpO2 98%   BMI 29.39 kg/m   Physical Exam  Vitals and nursing note reviewed.  Constitutional:      General: She is not in acute distress.    Appearance: She is not ill-appearing.     Comments: Appears older than stated age  HENT:     Head: Normocephalic and atraumatic.     Right Ear: Tympanic membrane and external ear normal.     Left Ear: Tympanic membrane and external ear normal.     Nose: Congestion present.     Mouth/Throat:     Mouth: Mucous membranes are moist.     Pharynx: Oropharynx is clear.     Comments: No white patches or oral lesions seen on oral mucosa or buccal surface. Eyes:     General: No scleral icterus.       Right eye: No discharge.        Left eye: No discharge.     Extraocular Movements: Extraocular movements intact.     Conjunctiva/sclera: Conjunctivae normal.     Pupils: Pupils are equal, round, and reactive to light.  Neck:     Vascular: No JVD.  Cardiovascular:     Rate and Rhythm: Regular rhythm. Tachycardia present.     Pulses: Normal pulses.          Radial pulses are 2+ on the right side and 2+ on the left side.     Heart sounds: Normal heart sounds.  Pulmonary:     Comments: Lungs clear to auscultation in all fields. Symmetric chest rise. No wheezing, rales, or rhonchi. Chest:     Chest wall: No tenderness.  Abdominal:     Comments: Abdomen is soft, non-distended, and non-tender in all quadrants. No rigidity, no guarding. No peritoneal signs.  Musculoskeletal:        General: Normal range of motion.     Cervical back: Normal range of motion.     Right lower leg: No edema.     Left lower leg: No edema.  Skin:    General: Skin is warm and dry.     Capillary Refill: Capillary refill takes less than 2 seconds.  Neurological:     Mental Status: She is oriented to person, place, and time.     GCS: GCS eye subscore is 4. GCS  verbal subscore is 5. GCS motor subscore is 6.     Comments: Fluent speech, no facial droop.  Psychiatric:        Behavior: Behavior normal.    ED Results / Procedures / Treatments   Labs (all labs ordered are listed, but only abnormal results are displayed) Labs Reviewed  RESP PANEL BY RT-PCR (FLU A&B, COVID) ARPGX2 - Abnormal; Notable for the following components:      Result Value   SARS Coronavirus 2 by RT PCR POSITIVE (*)    All other components within normal limits  COMPREHENSIVE METABOLIC PANEL - Abnormal; Notable for the following components:   CO2 18 (*)    Glucose, Bld 106 (*)    Albumin 3.3 (*)    All other components within normal limits  CBC WITH DIFFERENTIAL/PLATELET - Abnormal; Notable for the following components:   RBC 3.83 (*)    All other components within normal limits  D-DIMER, QUANTITATIVE - Abnormal; Notable for the following components:   D-Dimer, Quant 1.03 (*)    All other components within normal limits  CULTURE, BLOOD (SINGLE)  LACTIC ACID, PLASMA  PROTIME-INR  APTT  TROPONIN I (HIGH SENSITIVITY)  TROPONIN I (HIGH SENSITIVITY)    EKG EKG Interpretation  Date/Time:  Tuesday June 22 2021 18:04:12 EDT Ventricular Rate:  111 PR Interval:  151 QRS Duration: 82 QT Interval:  320 QTC Calculation: 435 R Axis:   -15 Text Interpretation: Sinus tachycardia Borderline left axis deviation Borderline T abnormalities, anterior leads Confirmed by Quintella Reichert [phone]) on 06/22/2021 7:26:36 PM  Radiology CT Angio Chest PE W and/or Wo Contrast  Result Date: 06/22/2021 CLINICAL DATA:  Shortness of breath, cough, chest pain EXAM: CT ANGIOGRAPHY CHEST WITH CONTRAST TECHNIQUE: Multidetector CT imaging of the chest was performed using the standard protocol during bolus administration of intravenous contrast. Multiplanar CT image reconstructions and MIPs were obtained to evaluate the vascular anatomy. CONTRAST:  29mL OMNIPAQUE IOHEXOL 350 MG/ML SOLN COMPARISON:   11/12/2017 FINDINGS: Cardiovascular: No filling defects in the pulmonary arteries to suggest pulmonary emboli. Heart is normal size. Aorta is normal caliber. Mediastinum/Nodes: No mediastinal, hilar, or axillary adenopathy. Trachea and esophagus are unremarkable. Thyroid unremarkable. Lungs/Pleura: Lungs are clear. No focal airspace opacities or suspicious nodules. No effusions. Upper Abdomen: Imaging into the upper abdomen demonstrates no acute findings. Musculoskeletal: Chest wall soft tissues are unremarkable. No acute bony abnormality. Review of the MIP images confirms the above findings. IMPRESSION: No evidence of pulmonary embolus. No acute cardiopulmonary disease. Electronically Signed   By: Rolm Baptise M.D.   On: 06/22/2021 22:43   DG Chest Portable 1 View  Result Date: 06/22/2021 CLINICAL DATA:  Short of breath EXAM: PORTABLE CHEST 1 VIEW COMPARISON:  09/06/2020 FINDINGS: No focal opacity or pleural effusion. Normal cardiomediastinal silhouette. No pneumothorax. Recording device over left chest. IMPRESSION: No active disease. Electronically Signed   By: Donavan Foil M.D.   On: 06/22/2021 17:33    Procedures Procedures   Medications Ordered in ED Medications  0.9 %  sodium chloride infusion (has no administration in time range)  famotidine (PEPCID) IVPB 20 mg premix (has no administration in time range)  methylPREDNISolone sodium succinate (SOLU-MEDROL) 125 mg/2 mL injection 125 mg (has no administration in time range)  albuterol (VENTOLIN HFA) 108 (90 Base) MCG/ACT inhaler 2 puff (has no administration in time range)  EPINEPHrine (EPI-PEN) injection 0.3 mg (has no administration in time range)  benzonatate (TESSALON) capsule 100 mg (has no administration in time range)  acetaminophen (TYLENOL) tablet 650 mg (650 mg Oral Given 06/22/21 1546)  lactated ringers bolus 1,000 mL (0 mLs Intravenous Stopped 06/22/21 1932)  ondansetron (ZOFRAN) injection 4 mg (4 mg Intravenous Given 06/22/21 1823)   morphine 4 MG/ML injection 4 mg (4 mg Intravenous Given 06/22/21 1824)  bebtelovimab EUA injection SOLN 175 mg (175 mg Intravenous Given 06/22/21 2048)  iohexol (OMNIPAQUE) 350 MG/ML injection 60 mL (60 mLs Intravenous Contrast Given 06/22/21 2228)    ED Course  I have reviewed the triage vital signs and the nursing notes.  Pertinent labs & imaging results that were available during my care of the patient were reviewed by me and considered in my medical decision making (see chart for details).     MDM Rules/Calculators/A&P                          History provided by patient with additional history obtained from chart review.    Patient presenting with URI symptoms also chest pain and reported hematemesis.  Patient febrile in triage and tachycardic.  She was given Tylenol and a COVID test was ordered.  Patient waited in the lobby for approximately 1.5 hours.  When she was placed in  exam room she continued to be tachycardic, fever is improving.  With her symptoms and vital signs utilized evolving sepsis order set.  On exam patient has normal work of breathing and lung sounds are clear to auscultation all fields.  No hypoxia.  No abdominal tenderness.  No lower extremity edema.  No signs of oral lesions or infection. Patient given liter of LR as well as Zofran and morphine for pain.  EKG shows sinus tachycardia.Chest x-ray shows no acute infectious processes.  I viewed image and agree with radiologist impression.  CBC and CMP overall unremarkable.  PTT and INR within normal range.  D-dimer is elevated at 1.03.  Delta troponin is flat.  Lactic acid is within normal range at 1.1.  COVID test is positive.  Patient is outside of the window for oral COVID treatment however does qualify for Mab infusion so that was given here.  Patient admits to feeling slightly better after infusion.  No adverse reactions.  Patient able to ambulate without hypoxia.  She was noted to be tachycardic with ambulation per  RN.  I repeat ambulation trial in patient is feeling at baseline.  Given elevated dimer CTA chest performed and shows no signs of infection or PE.  Patient tachycardia has resolved.  She remains hemodynamically stable.  She is tolerating p.o. intake here.  Patient given dose of Tessalon to help with her persistent cough.  Patient stable to be discharged home.  Return precautions discussed.  Discharge symptomatic care at home. Findings and plan of care discussed with supervising physician Dr. Ralene Bathe who agrees with plan of care.   Portions of this note were generated with Lobby[employer]. Dictation errors may occur despite best attempts at proofreading.  Winda JAELIN DEVINCENTIS was evaluated in Emergency Department on 06/22/2021 for the symptoms described in the history of present illness. She was evaluated in the context of the global COVID-19 pandemic, which necessitated consideration that the patient might be at risk for infection with the SARS-CoV-2 virus that causes COVID-19. Institutional protocols and algorithms that pertain to the evaluation of patients at risk for COVID-19 are in a state of rapid change based on information released by regulatory bodies including the CDC and federal and state organizations. These policies and algorithms were followed during the patient's care in the ED.   Final Clinical Impression(s) / ED Diagnoses Final diagnoses:  COVID    Rx / DC Orders ED Discharge Orders          Ordered    benzonatate (TESSALON) 100 MG capsule  Every 8 hours PRN        06/22/21 2350             Barrie Folk, PA-C 06/22/21 2356    Quintella Reichert, MD 06/23/21 [phone]

## 2021-06-27 LAB — CULTURE, BLOOD (SINGLE)
Culture: NO GROWTH
Special Requests: ADEQUATE

## 2021-07-01 ENCOUNTER — Other Ambulatory Visit: Payer: Self-pay

## 2021-07-01 ENCOUNTER — Ambulatory Visit (HOSPITAL_COMMUNITY)
Admission: RE | Admit: 2021-07-01 | Discharge: 2021-07-01 | Disposition: A | Discharge status: Home / Self Care | Payer: 59 | Source: Ambulatory Visit | Attending: Infectious Disease | Admitting: Infectious Disease

## 2021-07-01 DIAGNOSIS — D808 Other immunodeficiencies with predominantly antibody defects: Secondary | ICD-10-CM | POA: Insufficient documentation

## 2021-07-01 MED ORDER — IMMUNE GLOBULIN (HUMAN) 10 GM/100ML IV SOLN
32000.0000 mg | INTRAVENOUS | Status: DC
  Administered 2021-07-01: 30 g via INTRAVENOUS
  Filled 2021-07-01: qty 200

## 2021-07-02 ENCOUNTER — Telehealth: Payer: Self-pay

## 2021-07-02 NOTE — Telephone Encounter (Signed)
[name] Horn from [facility] got a denial for 3rd time on yesterdays infusion:  INFUSION 240 07/01/2021 [place]  Orders (From admission, onward)   Ordered   Immune Globulin 10% (PRIVIGEN) IV infusion 30 g  Every 28 days,   Status:  Discontinued        07/01/21 1016   Immune Globulin 10% (PRIVIGEN) IV infusion 30 g  Every 28 days        Signed and Held    Calling to request that we get with whoever does the PA for these as the bill is up to $32,000.00. She can be reached at [phone]. She reports [name] Horn is ordering provider however, [name] Horn seems to be listed in chart. I will discuss with Butch Penny or Cassie and call [name] Horn back once I have investigated this issue.

## 2021-07-02 NOTE — Telephone Encounter (Signed)
[name] Horn from [facility] got a denial for 3rd time on yesterdays infusion:  INFUSION 240 07/01/2021 [place]   Orders (From admission, onward)     Ordered    Immune Globulin 10% (PRIVIGEN) IV infusion 30 g  Every 28 days,   Status:  Discontinued        07/01/21 1016    Immune Globulin 10% (PRIVIGEN) IV infusion 30 g  Every 28 days        Signed and Held      Calling to request that we get with whoever does the PA for these as the bill is up to $32,000.00. She can be reached at [phone]. She reports Carlyle Basques is ordering provider however, Dr Tommy Medal seems to be listed in chart. I will discuss with Butch Penny or Cassie and call [name] Horn back once I have investigated this issue.        Note

## 2021-07-05 NOTE — Telephone Encounter (Signed)
Pharmacy does not do the PAs for these at this time.

## 2021-07-06 ENCOUNTER — Telehealth: Payer: Self-pay

## 2021-07-06 ENCOUNTER — Other Ambulatory Visit: Payer: Self-pay

## 2021-07-06 ENCOUNTER — Encounter: Payer: Self-pay | Admitting: Infectious Disease

## 2021-07-06 ENCOUNTER — Ambulatory Visit (INDEPENDENT_AMBULATORY_CARE_PROVIDER_SITE_OTHER): Payer: 59 | Admitting: Infectious Disease

## 2021-07-06 VITALS — BP 106/80 | HR 84 | Temp 98.0°F | Wt 175.6 lb

## 2021-07-06 DIAGNOSIS — D72819 Decreased white blood cell count, unspecified: Secondary | ICD-10-CM

## 2021-07-06 DIAGNOSIS — K141 Geographic tongue: Secondary | ICD-10-CM

## 2021-07-06 DIAGNOSIS — B370 Candidal stomatitis: Secondary | ICD-10-CM

## 2021-07-06 DIAGNOSIS — R519 Headache, unspecified: Secondary | ICD-10-CM

## 2021-07-06 DIAGNOSIS — B001 Herpesviral vesicular dermatitis: Secondary | ICD-10-CM

## 2021-07-06 DIAGNOSIS — M05712 Rheumatoid arthritis with rheumatoid factor of left shoulder without organ or systems involvement: Secondary | ICD-10-CM

## 2021-07-06 DIAGNOSIS — D808 Other immunodeficiencies with predominantly antibody defects: Secondary | ICD-10-CM | POA: Diagnosis not present

## 2021-07-06 DIAGNOSIS — K200 Eosinophilic esophagitis: Secondary | ICD-10-CM | POA: Diagnosis not present

## 2021-07-06 DIAGNOSIS — U071 COVID-19: Secondary | ICD-10-CM

## 2021-07-06 DIAGNOSIS — Z8619 Personal history of other infectious and parasitic diseases: Secondary | ICD-10-CM

## 2021-07-06 DIAGNOSIS — D72818 Other decreased white blood cell count: Secondary | ICD-10-CM

## 2021-07-06 HISTORY — DX: COVID-19: U07.1

## 2021-07-06 HISTORY — DX: Decreased white blood cell count, unspecified: D72.819

## 2021-07-06 NOTE — Telephone Encounter (Signed)
Faxed orders for IVIG per Dr. Tommy Medal to [facility] Short Stay. Orders are specifically for one dose in August and one dose in September.   Beryle Flock, RN

## 2021-07-06 NOTE — Progress Notes (Signed)
Subjective:   Chief complaint: Persistent cough also some recent problems with aches and intention tremor with difficulty speaking being evaluated by neurology    Patient ID: [name] Horn, female    DOB: [birth year], 45 y.o.   MRN: [id number]  HPI   45 year old Caucasian lady with hx of seropositive RA with keratoconjunctivitis sicca, and osteoarthritis.who  Started on MTX in November of  2020. She also  Has hx of eosinophilic esophagitis and was on fluticasone. She has had history of C difficile with 3 episodes. She  Had problems with recurrent strep throat and had tonsillectomy, pneumonia a few times, ear infection, tooth abscess. She  Has had  Shingles outbreak already. She then began to have thrush in January  which was attributed to her  Oral corticosteroid therapy but  Has persisted despite this being stopped.   She was taking nystatin swish and  Swallow and previously twice weekly fluconazole at 200 mg  While her  Overall immunoglobulin levels were normal she did not mount immune response to vaccination with pneumovax and prevnar with antibodies checked > 4 months after this that were low.  She was bitten by a cat and recently had tetanus vaccine in ER and antibiotics.  After 2 COVID vaccines she had not much of a response in terms of flu like  Symptoms etc that are typically experienced by patients with 2 dose mRNA based vaccines.   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  During one visit several visits ago she appeared to be suffering from intertrigo.  I increased her fluconazole 200 mg daily prophylactic dose and a higher dose  She then  continued to have abnormalities on her tongue but it was not clear to me that this is really thrush but could be something else such as geographic tongue for example.  I ended up taking her off of the fluconazole with desire to see how she did  off of it.  She ended up developing stomatitis but missed her appointment with Dr. Megan Salon was late again and then finally did see him he did a swab for fungal culture which is growing copious Candida albicans   Sensitivity data on her candida showed:  organism CANDIDA ALBICANS   Antimicrobial 1 FLUCONAZOLE   MIC 0.500 S   Antimicrobial 2 VORICONAZOLE   MIC <=0.008 S   Antimicrobial 3 POSACONAZOLE   MIC 0.030   Antimicrobial 4 CASPOFUNGIN   MIC 0.015 S   Comment: .    She remained on fluconazole at high dose since then and was seen by Terri Piedra in the interim.  Her tongue lesions had not resolved though she was telling me that her tongue was "completely pink in week prior to one visit.  Again I have felt over and over again that her story fits much better with geographic tongue then with candidiasis.  I  have wanted to observe her again off of antifungal therapy.     She was then OFF of azole therapy and saw Dr. Redmond Horn who did perform a biopsy but not of the tongue but rather the buccal mucosa where she had more inflammation and possible thrush.  Biopsy came back without malignancy, hyperkeratosis with some yeast as well   I then suggested that we stop the methotrexate as it could potentially be contributing to her oral lesions.  It is continue to be held by  rheumatology   Since then she has had much less frequent outbreaks of these lesions on her tongue and inner buccal mucosa.  Also been seen [facility] by an allergist and immunologist there who is working her up for immunodeficiency.  She has remained on her IVIG which the immunologist at [facility] was aware of and wanted her apparently to remain on it in the interim.  Several days ago she told me that with me that she thought she was again having an outbreak of lesions on her buccal mucosa and tongue and seen by PCP who prescribed fluconazole which she said led to rapid resolution of the lesions.  I  continued to have skepticism and to voice skepticism that this is due to Candida.  She was told to come into my office or ENT to have a swab sent for fungal cultures if she was having the beginning of another outbreak.  We sent cultures on 8 April for both routine cultures and fungal cultures and neither 1 yielded an organism, specifically with no yeast whatsoever even on fungal culture were isolated.  Seen by Dr. Caro Hight at [facility].  He was also skeptical that these lesions had something to do with yeast and thought they are more likely rheumatological in nature.  He was more supportive of restarting methotrexate and if the lesions worsen exploring other immunosuppressive therapy.  He also asked immunology to consider Crypopyrinh-associated periodic syndromes (CAPS), including [place]. This family    She then saw my partner Dr. Hatcher \\and  he also agreed that she did not likely have a candidal infection.  He prescribed Magic mouthwash for this.  In the interim the patient has had IVIG infusion yesterday.    As I last saw [name] Horn she had another biopsy with ENT, and Dr. 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  [name] Horn also recently suffered from COVID-19 infection and was in the ER where she received an immunoglobulin infusion.  She said that she had symptoms of fever sinus congestion sore throat and some confusion.  She is now having a cough that is not yet going away and which she has been prescribed a prednisone taper for.  Her tongue lesions seem fairly stable at present.  She is due to see immunology at [facility] in August.  She is also had troubles with headaches and then with difficulty opening her mouth to eat and problems with what she describes  as a jaw drop.  She has been seen by neurology at [facility] for this.  Getting several images of her spine done today.  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)     Past Surgical History:  Procedure Laterality Date   APPENDECTOMY     cheek biopsy  11/2020   CHOLECYSTECTOMY     ESOPHAGOGASTRODUODENOSCOPY N/A 04/30/2017  Procedure: ESOPHAGOGASTRODUODENOSCOPY (EGD);  Surgeon: Teena Irani, MD;  Location: [facility] ENDOSCOPY;  Service: Endoscopy;  Laterality: N/A;   HERNIA REPAIR     x2   KNEE SURGERY     x3   MINIMALLY INVASIVE FORAMINOTOMY CERVICAL SPINE     C6-T1, Nitka   SAVORY DILATION N/A 04/30/2017   Procedure: SAVORY DILATION;  Surgeon: Teena Irani, MD;  Location: [place];  Service: Endoscopy;  Laterality: N/A;   TONSILLECTOMY     TOTAL ABDOMINAL HYSTERECTOMY     Sarcoma, s/p XRT    Family History  Problem Relation Age of Onset   Hypertension Other    Hyperlipidemia Other    Colon cancer Other        grandmother   Hashimoto's thyroiditis Mother    Eczema Mother    Angioedema Mother    Throat cancer Father    Arrhythmia Father    Angioedema Maternal Grandfather       Social History   Socioeconomic History   Marital status: Married    Spouse name: Not on file   Number of children: Not on file   Years of education: Not on file   Highest education level: Not on file  Occupational History   Not on file  Tobacco Use   Smoking status: Passive Smoke Exposure - Never Smoker   Smokeless tobacco: Never  Vaping Use   Vaping Use: Never used  Substance and Sexual Activity    Alcohol use: Not Currently   Drug use: No   Sexual activity: Yes    Partners: Male    Birth control/protection: None  Other Topics Concern   Not on file  Social History Narrative   Not on file   Social Determinants of Health   Financial Resource Strain: Not on file  Food Insecurity: Not on file  Transportation Needs: Not on file  Physical Activity: Not on file  Stress: Not on file  Social Connections: Not on file    Allergies  Allergen Reactions   Other     Dermabond surgical glue.    Sulfa Antibiotics Anaphylaxis, Shortness Of Breath, Swelling and Rash    Angioedema (also)   Sulfonamide Derivatives Hives, Shortness Of Breath and Swelling    TONGUE SWELLS   Benadryl [Diphenhydramine Hcl] Other (See Comments)    Hyperactivity      Current Outpatient Medications:    amoxicillin-clavulanate (AUGMENTIN) 875-125 MG tablet, Take 1 tablet by mouth 2 (two) times daily., Disp: 14 tablet, Rfl: 0   amphetamine-dextroamphetamine (ADDERALL) 20 MG tablet, Take 20 mg by mouth 3 (three) times daily., Disp: , Rfl:    atropine 1 % ophthalmic solution, Place 1 drop into the left eye daily as needed (for dry eye)., Disp: , Rfl:    baclofen (LIORESAL) 10 MG tablet, Take 10 mg by mouth daily., Disp: , Rfl:    benzonatate (TESSALON) 100 MG capsule, Take 1 capsule (100 mg total) by mouth every 8 (eight) hours as needed for cough., Disp: 21 capsule, Rfl: 0   Bepotastine Besilate 1.5 % SOLN, Place 1 drop into both eyes 2 (two) times a day., Disp: , Rfl:    cyclobenzaprine (FLEXERIL) 10 MG tablet, Take 1 tablet by mouth 3 (three) times daily as needed., Disp: , Rfl:    cycloSPORINE (RESTASIS) 0.05 % ophthalmic emulsion, Place 1 drop into both eyes 2 (two) times daily., Disp: , Rfl:    dicyclomine (BENTYL) 10 MG capsule, Take 10 mg by mouth daily as needed for nausea/vomiting., Disp: ,  Rfl:    famotidine (PEPCID) 40 MG tablet, Take 40 mg by mouth at bedtime as needed., Disp: , Rfl:    folic acid  (FOLVITE) 1 MG tablet, Take 2 tablets (2 mg total) by mouth daily., Disp: 180 tablet, Rfl: 3   furosemide (LASIX) 20 MG tablet, Take 20 mg by mouth daily as needed. , Disp: , Rfl:    LORazepam (ATIVAN) 1 MG tablet, Take 1 mg by mouth 3 (three) times daily as needed., Disp: , Rfl:    magic mouthwash (nystatin, lidocaine, diphenhydrAMINE, alum & mag hydroxide) suspension, Swish and spit 5 mLs 3 (three) times daily as needed for mouth pain., Disp: 180 mL, Rfl: 1   metoCLOPramide (REGLAN) 10 MG tablet, Take 10 mg by mouth daily. , Disp: , Rfl:    metoprolol succinate (TOPROL-XL) 25 MG 24 hr tablet, Take 25 mg by mouth daily., Disp: , Rfl:    nystatin (MYCOSTATIN) 100000 UNIT/ML suspension, Take 10 mLs (1,000,000 Units total) by mouth 3 (three) times daily., Disp: 60 mL, Rfl: 2   ondansetron (ZOFRAN-ODT) 4 MG disintegrating tablet, Take 1 tablet (4 mg total) by mouth every 8 (eight) hours as needed for nausea or vomiting., Disp: 15 tablet, Rfl: 0   oxyCODONE-acetaminophen (PERCOCET) 7.5-325 MG tablet, as needed., Disp: , Rfl:    pantoprazole (PROTONIX) 40 MG tablet, Take 1 tablet (40 mg total) by mouth 2 (two) times daily., Disp: 60 tablet, Rfl: 1   Phenazopyridine HCl (PYRIDIUM PO), Take by mouth as needed., Disp: , Rfl:    Polyethyl Glycol-Propyl Glycol (SYSTANE) 0.4-0.3 % GEL ophthalmic gel, Place 1 application into both eyes daily as needed (dryness)., Disp: , Rfl:    potassium chloride (K-DUR) 10 MEQ tablet, Take 10 mEq by mouth daily as needed. , Disp: , Rfl:    pravastatin (PRAVACHOL) 40 MG tablet, Take 40 mg by mouth daily., Disp: , Rfl:    prednisoLONE acetate (PRED FORTE) 1 % ophthalmic suspension, Place 1 drop into the left eye as needed., Disp: , Rfl:    promethazine (PHENERGAN) 25 MG suppository, Place 1 suppository (25 mg total) rectally every 6 (six) hours as needed for nausea or vomiting., Disp: 12 each, Rfl: 0   Promethazine HCl (PHENERGAN PO), Take by mouth as needed., Disp: , Rfl:     SUMAtriptan (IMITREX) 25 MG tablet, Take 25 mg by mouth every 2 (two) hours as needed for migraine. May repeat in 2 hours if headache persists or recurs., Disp: , Rfl:    topiramate (TOPAMAX) 100 MG tablet, Take 100 mg by mouth daily., Disp: , Rfl:    topiramate (TOPAMAX) 25 MG tablet, Take 25 mg by mouth at bedtime. , Disp: , Rfl:    traZODone (DESYREL) 150 MG tablet, Take 150 mg by mouth at bedtime as needed for sleep., Disp: , Rfl:    triamcinolone lotion (KENALOG) 0.1 %, APPLY TOPICALLY TO SCALP AT BEDTIME AS DIRECTED, Disp: , Rfl:    valACYclovir (VALTREX) 1000 MG tablet, Take 1 tablet (1,000 mg total) by mouth daily., Disp: 30 tablet, Rfl: 5     Review of Systems  Constitutional:  Negative for activity change, appetite change, chills, diaphoresis, fatigue, fever and unexpected weight change.  HENT:  Positive for sore throat. Negative for congestion, rhinorrhea, sinus pressure, sneezing and trouble swallowing.   Eyes:  Negative for photophobia and visual disturbance.  Respiratory:  Positive for cough and shortness of breath. Negative for chest tightness, wheezing and stridor.   Cardiovascular:  Negative for  chest pain, palpitations and leg swelling.  Gastrointestinal:  Negative for abdominal distention, abdominal pain, anal bleeding, blood in stool, constipation, diarrhea, nausea and vomiting.  Genitourinary:  Negative for difficulty urinating, dysuria, flank pain and hematuria.  Musculoskeletal:  Negative for arthralgias, back pain, gait problem, joint swelling and myalgias.  Skin:  Negative for color change, pallor, rash and wound.  Neurological:  Positive for tremors and speech difficulty. Negative for dizziness, weakness and light-headedness.  Hematological:  Negative for adenopathy. Does not bruise/bleed easily.  Psychiatric/Behavioral:  Positive for confusion. Negative for agitation, behavioral problems, decreased concentration, dysphoric mood and sleep disturbance.        Objective:   Physical Exam Constitutional:      General: She is not in acute distress.    Appearance: She is well-developed. She is not diaphoretic.  HENT:     Head: Normocephalic and atraumatic.     Mouth/Throat:     Pharynx: No oropharyngeal exudate.  Eyes:     General: No scleral icterus.    Conjunctiva/sclera: Conjunctivae normal.  Cardiovascular:     Rate and Rhythm: Normal rate and regular rhythm.  Pulmonary:     Effort: Pulmonary effort is normal. No respiratory distress.     Breath sounds: No wheezing.  Abdominal:     General: There is no distension.  Musculoskeletal:        General: No tenderness.     Cervical back: Normal range of motion and neck supple.  Skin:    General: Skin is warm and dry.     Coloration: Skin is not pale.     Findings: No erythema or rash.  Neurological:     General: No focal deficit present.     Mental Status: She is alert and oriented to person, place, and time.     Motor: No abnormal muscle tone.     Coordination: Coordination normal.  Psychiatric:        Mood and Affect: Mood normal.        Behavior: Behavior normal.        Thought Content: Thought content normal.        Judgment: Judgment normal.   Op lesions from photograph she showed me several visits ago.    ]  Tongue  August 12, 2020:    Lips when seen by Dr. 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    If she wants to take nystatin for this but will avoid azole or systemic therapy    She is continuing to follow closely with Dr. 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  They take many  COVID infection: still with cough and has been given steroids by PCP was in ER and received monoclonal antibody infusion  Leukopenia: fluctuating  Headaches, "jaw drop" having studies today and seen by Neurology  I spent more than 8minutes with the patient including  face to face counseling of the patient personally reviewing radiographs, along with pertinent laboratory microbiological,  data, review of medical records in preparation for the visit and during the visit and in coordination of her care.

## 2021-07-08 NOTE — Telephone Encounter (Signed)
Thakn you!

## 2021-07-08 NOTE — Telephone Encounter (Signed)
Thank

## 2021-07-19 ENCOUNTER — Telehealth: Payer: Self-pay | Admitting: Pharmacy Technician

## 2021-07-19 NOTE — Telephone Encounter (Signed)
Auth Submission: REQUIRES AUTH  Payer: [employer] Medication & CPT/J Code(s) submitted: [employer] (IVIG) [id number] Route of submission (phone, fax, portal): Auth type: Buy/Bill  Active coverage:  Deductible $2350 Pt has met all deductibles and co-pays will  be covered 100%.   Will update once we receive a response.

## 2021-07-20 NOTE — Telephone Encounter (Addendum)
Auth Submission: PENDING Payer: [employer] Medication & CPT/J Code(s) submitted: [employer] (IVIG) [id number] Route of submission (phone, fax, portal): PORTAL Auth type: Buy/Bill Units/visits requested: 30 GM Q 28 DAYS Reference number: [id number]   Will update once we receive a response.

## 2021-07-22 ENCOUNTER — Other Ambulatory Visit: Payer: Self-pay | Admitting: Pharmacy Technician

## 2021-07-22 DIAGNOSIS — D849 Immunodeficiency, unspecified: Secondary | ICD-10-CM | POA: Insufficient documentation

## 2021-07-22 DIAGNOSIS — D806 Antibody deficiency with near-normal immunoglobulins or with hyperimmunoglobulinemia: Secondary | ICD-10-CM

## 2021-07-22 NOTE — Telephone Encounter (Signed)
Dr. Tommy Medal, Wyline Copas Submission: APPROVED - [employer]  Payer:  [employer] Medication & CPT/J Code(s) submitted: [employer] (IVIG) [id number] Route of submission (phone, fax, portal): PORTAL Auth type: Buy/Bill Units/visits requested: [place] Reference number: [id number] Approval Dates: 07/20/21 - 07/20/22

## 2021-07-24 ENCOUNTER — Emergency Department (HOSPITAL_COMMUNITY)
Admission: EM | Admit: 2021-07-24 | Discharge: 2021-07-25 | Disposition: A | Discharge status: Home / Self Care | Payer: 59 | Attending: Emergency Medicine | Admitting: Emergency Medicine

## 2021-07-24 ENCOUNTER — Encounter (HOSPITAL_COMMUNITY): Payer: Self-pay | Admitting: Emergency Medicine

## 2021-07-24 ENCOUNTER — Emergency Department (HOSPITAL_COMMUNITY): Payer: 59

## 2021-07-24 ENCOUNTER — Other Ambulatory Visit: Payer: Self-pay

## 2021-07-24 DIAGNOSIS — R000 Tachycardia, unspecified: Secondary | ICD-10-CM | POA: Insufficient documentation

## 2021-07-24 DIAGNOSIS — U071 COVID-19: Secondary | ICD-10-CM | POA: Diagnosis not present

## 2021-07-24 DIAGNOSIS — Z8542 Personal history of malignant neoplasm of other parts of uterus: Secondary | ICD-10-CM | POA: Diagnosis not present

## 2021-07-24 DIAGNOSIS — Z7722 Contact with and (suspected) exposure to environmental tobacco smoke (acute) (chronic): Secondary | ICD-10-CM | POA: Insufficient documentation

## 2021-07-24 DIAGNOSIS — R091 Pleurisy: Secondary | ICD-10-CM | POA: Insufficient documentation

## 2021-07-24 DIAGNOSIS — J45909 Unspecified asthma, uncomplicated: Secondary | ICD-10-CM | POA: Insufficient documentation

## 2021-07-24 DIAGNOSIS — R079 Chest pain, unspecified: Secondary | ICD-10-CM | POA: Diagnosis present

## 2021-07-24 LAB — URINALYSIS, ROUTINE W REFLEX MICROSCOPIC
Bilirubin Urine: NEGATIVE
Glucose, UA: NEGATIVE mg/dL
Hgb urine dipstick: NEGATIVE
Ketones, ur: NEGATIVE mg/dL
Leukocytes,Ua: NEGATIVE
Nitrite: NEGATIVE
Protein, ur: NEGATIVE mg/dL
Specific Gravity, Urine: 1.012 (ref 1.005–1.030)
pH: 6 (ref 5.0–8.0)

## 2021-07-24 LAB — TROPONIN I (HIGH SENSITIVITY)
Troponin I (High Sensitivity): 7 ng/L (ref <18)
Troponin I (High Sensitivity): 5 ng/L (ref <18)

## 2021-07-24 LAB — LIPASE, BLOOD: Lipase: 28 U/L (ref 11–51)

## 2021-07-24 LAB — CBC WITH DIFFERENTIAL/PLATELET
Abs Immature Granulocytes: 0.08 10*3/uL — ABNORMAL HIGH (ref 0.00–0.07)
Basophils Absolute: 0 10*3/uL (ref 0.0–0.1)
Basophils Relative: 0 %
Eosinophils Absolute: 0.2 10*3/uL (ref 0.0–0.5)
Eosinophils Relative: 1 %
HCT: 43.9 % (ref 36.0–46.0)
Hemoglobin: 14.4 g/dL (ref 12.0–15.0)
Immature Granulocytes: 1 %
Lymphocytes Relative: 20 %
Lymphs Abs: 2.6 10*3/uL (ref 0.7–4.0)
MCH: 32.3 pg (ref 26.0–34.0)
MCHC: 32.8 g/dL (ref 30.0–36.0)
MCV: 98.4 fL (ref 80.0–100.0)
Monocytes Absolute: 0.8 10*3/uL (ref 0.1–1.0)
Monocytes Relative: 7 %
Neutro Abs: 9.1 10*3/uL — ABNORMAL HIGH (ref 1.7–7.7)
Neutrophils Relative %: 71 %
Platelets: 245 10*3/uL (ref 150–400)
RBC: 4.46 MIL/uL (ref 3.87–5.11)
RDW: 13.9 % (ref 11.5–15.5)
WBC: 12.7 10*3/uL — ABNORMAL HIGH (ref 4.0–10.5)
nRBC: 0 % (ref 0.0–0.2)

## 2021-07-24 LAB — COMPREHENSIVE METABOLIC PANEL
ALT: 18 U/L (ref 0–44)
AST: 23 U/L (ref 15–41)
Albumin: 3.7 g/dL (ref 3.5–5.0)
Alkaline Phosphatase: 82 U/L (ref 38–126)
Anion gap: 7 (ref 5–15)
BUN: 7 mg/dL (ref 6–20)
CO2: 22 mmol/L (ref 22–32)
Calcium: 10 mg/dL (ref 8.9–10.3)
Chloride: 110 mmol/L (ref 98–111)
Creatinine, Ser: 0.88 mg/dL (ref 0.44–1.00)
GFR, Estimated: >60 mL/min (ref >60)
Glucose, Bld: 113 mg/dL — ABNORMAL HIGH (ref 70–99)
Potassium: 4.2 mmol/L (ref 3.5–5.1)
Sodium: 139 mmol/L (ref 135–145)
Total Bilirubin: 0.8 mg/dL (ref 0.3–1.2)
Total Protein: 7.4 g/dL (ref 6.5–8.1)

## 2021-07-24 NOTE — ED Triage Notes (Signed)
Pt states she tested + for COVID approx 3 weeks ago and initially felt like she was getting better.  However, she continues to have a cough with tan phlegm, SOB, and L sided chest pain that radiates to back.

## 2021-07-24 NOTE — ED Provider Notes (Signed)
Emergency Medicine Provider Triage Evaluation Note  [name] Horn , a 45 y.o. female  was evaluated in triage.  Pt complains of cough x3 weeks.  Patient tested positive for COVID 3 weeks ago and felt like she has had an ongoing cough since then.  She states the cough is productive.  She also is complaining of left-sided chest pain that radiates to her back, shortness of breath and intermittent fever for the last 10 days..  Review of Systems  Positive: Cough, chills, shortness of breath, chest pain Negative: Urinary symptoms, nausea, vomiting  Physical Exam  BP (!) 122/59 (BP Location: Right Arm)   Pulse 81   Temp 99.3 F (37.4 C) (Oral)   Resp 18   SpO2 98%  Gen:   Awake, no distress   Resp:  Normal effort  MSK:   Moves extremities without difficulty  Other:  Diffuse abdominal tenderness without peritoneal signs.  Medical Decision Making  Medically screening exam initiated at 6:10 PM.  Appropriate orders placed.  Burdette KATJA COCKS was informed that the remainder of the evaluation will be completed by another provider, this initial triage assessment does not replace that evaluation, and the importance of remaining in the ED until their evaluation is complete.  Cardiac work-up and abdominal labs ordered.   Portions of this note were generated with Lobbyist. Dictation errors may occur despite best attempts at proofreading.    Barrie Folk, PA-C 07/24/21 1813    Wyvonnia Dusky, MD 07/25/21 [phone]

## 2021-07-25 ENCOUNTER — Emergency Department (HOSPITAL_COMMUNITY): Payer: 59

## 2021-07-25 MED ORDER — DEXAMETHASONE SODIUM PHOSPHATE 10 MG/ML IJ SOLN
10.0000 mg | Freq: Once | INTRAMUSCULAR | Status: AC
  Administered 2021-07-25: 10 mg via INTRAVENOUS
  Filled 2021-07-25: qty 1

## 2021-07-25 MED ORDER — ONDANSETRON HCL 4 MG/2ML IJ SOLN
4.0000 mg | Freq: Once | INTRAMUSCULAR | Status: AC
  Administered 2021-07-25: 4 mg via INTRAVENOUS
  Filled 2021-07-25: qty 2

## 2021-07-25 MED ORDER — MELOXICAM 15 MG PO TABS: 15.0000 mg | ORAL_TABLET | Freq: Every day | ORAL | 0 refills | Status: DC

## 2021-07-25 MED ORDER — KETOROLAC TROMETHAMINE 30 MG/ML IJ SOLN
15.0000 mg | Freq: Once | INTRAMUSCULAR | Status: AC
  Administered 2021-07-25: 15 mg via INTRAVENOUS
  Filled 2021-07-25: qty 1

## 2021-07-25 MED ORDER — SODIUM CHLORIDE 0.9 % IV BOLUS
1000.0000 mL | Freq: Once | INTRAVENOUS | Status: AC
  Administered 2021-07-25: 1000 mL via INTRAVENOUS

## 2021-07-25 MED ORDER — IOHEXOL 350 MG/ML SOLN
50.0000 mL | Freq: Once | INTRAVENOUS | Status: AC | PRN
  Administered 2021-07-25: 50 mL via INTRAVENOUS

## 2021-07-25 NOTE — ED Provider Notes (Signed)
[facility] EMERGENCY DEPARTMENT Provider Note   CSN: [id number] Arrival date & time: 07/24/21  1704     History No chief complaint on file.   [name] Horn is a 45 y.o. female.  Patient presents to the emergency department for evaluation of chest pain.  Patient reports that she was diagnosed with COVID 3 weeks ago.  She has had persistent cough.  She reports that she has been experiencing pain in the left side of her chest that worsens with coughing taking deep breaths.  Pain is sharp in nature.  She has had nausea and has not been eating or drinking much, feels like she might be dehydrated.      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   COVID-19 07/06/2021   Leukopenia 07/06/2021   Fatigue 12/30/2020   Malaise 12/30/2020   FUO (fever of unknown origin) 10/05/2020   Stomatitis 09/03/2020   Rheumatoid arthritis ([place]) 08/12/2020   Herpes labialis 08/12/2020   Transaminitis 08/12/2020   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   Intertrigo 06/22/2020   Interstitial cystitis 05/18/2020   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   Chronic rhinitis 04/23/2020   Adverse food reaction [id number]   Eosinophilic esophagitis [id number]   Oral candidiasis 04/02/2020   History of Clostridium difficile infection  04/02/2020   History of shingles 04/02/2020   History of asthma 04/02/2020   Multiple drug allergies 04/02/2020   Environmental allergies 04/02/2020   History of loop recorder 01/19/2020   PVC (premature ventricular contraction) 01/19/2020   Uterine cancer ([place]) 01/19/2020   Colitis 08/14/2019   Constipation    Gastroparesis 04/03/2019   Palpitations 04/10/2018   Nausea and vomiting 04/26/2017   Nausea & vomiting 04/25/2017   Chest pain 04/25/2017   Enterocolitis due to Clostridium difficile 01/04/2016   Incisional hernia without obstruction or gangrene 11/18/2015   Personal history of other diseases of the nervous system and sense organs 10/14/2015   Disease of pancreas 01/21/2015   Other specified bacterial agents as the cause of diseases classified elsewhere 01/21/2015   Radiculopathy of cervical region 12/09/2014   Brachial neuritis 12/09/2014   Annual physical exam 01/30/2012   Melena 01/30/2012   Pain in ankle 11/24/2010   ANXIETY 10/11/2010   DVT 10/11/2010   Generalized anxiety disorder 10/11/2010   COSTOCHONDRITIS 03/03/2008   Cellulitis of toe 12/01/2007   MENOPAUSE, PREMATURE 07/20/2007   UTERINE CANCER, HX OF 07/16/2007   Other postprocedural status(V45.89) 07/16/2007    Past Surgical History:  Procedure Laterality Date   APPENDECTOMY     cheek biopsy  11/2020   CHOLECYSTECTOMY     ESOPHAGOGASTRODUODENOSCOPY N/A 04/30/2017   Procedure: ESOPHAGOGASTRODUODENOSCOPY (EGD);  Surgeon: Teena Irani, MD;  Location: [facility] ENDOSCOPY;  Service: Endoscopy;  Laterality: N/A;   HERNIA REPAIR  x2   KNEE SURGERY     x3   MINIMALLY INVASIVE FORAMINOTOMY CERVICAL SPINE     C6-T1, Nitka   SAVORY DILATION N/A 04/30/2017   Procedure: SAVORY DILATION;  Surgeon: Teena Irani, MD;  Location: [facility];  Service: Endoscopy;  Laterality: N/A;   TONSILLECTOMY     TOTAL ABDOMINAL HYSTERECTOMY     Sarcoma, s/p XRT     OB History   No obstetric history on file.     Family  History  Problem Relation Age of Onset   Hypertension Other    Hyperlipidemia Other    Colon cancer Other        grandmother   Hashimoto's thyroiditis Mother    Eczema Mother    Angioedema Mother    Throat cancer Father    Arrhythmia Father    Angioedema Maternal Grandfather     Social History   Tobacco Use   Smoking status: Never    Passive exposure: Yes   Smokeless tobacco: Never  Vaping Use   Vaping Use: Never used  Substance Use Topics   Alcohol use: Not Currently   Drug use: No    Home Medications Prior to Admission medications   Medication Sig Start Date End Date Taking? Authorizing Provider  amoxicillin-clavulanate (AUGMENTIN) 875-125 MG tablet Take 1 tablet by mouth 2 (two) times daily. Patient not taking: Reported on 07/06/2021 05/12/21   Rozetta Nunnery, MD  amphetamine-dextroamphetamine (ADDERALL) 20 MG tablet Take 20 mg by mouth 3 (three) times daily.    [provider]  atropine 1 % ophthalmic solution Place 1 drop into the left eye daily as needed (for dry eye).    [provider]  baclofen (LIORESAL) 10 MG tablet Take 10 mg by mouth daily. 02/28/20   [provider]  benzonatate (TESSALON) 100 MG capsule Take 1 capsule (100 mg total) by mouth every 8 (eight) hours as needed for cough. 06/22/21   Walisiewicz, Harley Hallmark, PA-C  Bepotastine Besilate 1.5 % SOLN Place 1 drop into both eyes 2 (two) times a day.    [provider]  cyclobenzaprine (FLEXERIL) 10 MG tablet Take 1 tablet by mouth 3 (three) times daily as needed. 05/05/21   [provider]  cycloSPORINE (RESTASIS) 0.05 % ophthalmic emulsion Place 1 drop into both eyes 2 (two) times daily.    [provider]  dicyclomine (BENTYL) 10 MG capsule Take 10 mg by mouth daily as needed for nausea/vomiting. 01/02/19   [provider]  famotidine (PEPCID) 40 MG tablet Take 40 mg by mouth at bedtime as needed. 05/02/21   [provider]  folic acid  (FOLVITE) 1 MG tablet Take 2 tablets (2 mg total) by mouth daily. 10/30/19   Bo Merino, MD  furosemide (LASIX) 20 MG tablet Take 20 mg by mouth daily as needed.     [provider]  LORazepam (ATIVAN) 1 MG tablet Take 1 mg by mouth 3 (three) times daily as needed. 03/08/20   [provider]  magic mouthwash (nystatin, lidocaine, diphenhydrAMINE, alum & mag hydroxide) suspension Swish and spit 5 mLs 3 (three) times daily as needed for mouth pain. 06/10/21   Truman Hayward, MD  metoCLOPramide (REGLAN) 10 MG tablet Take 10 mg by mouth daily.     [provider]  metoprolol succinate (TOPROL-XL) 25 MG 24 hr tablet Take 25 mg by mouth daily.    [provider]  nystatin (MYCOSTATIN) 100000 UNIT/ML suspension Take 10 mLs (1,000,000  Units total) by mouth 3 (three) times daily. 12/07/20   Truman Hayward, MD  ondansetron (ZOFRAN-ODT) 4 MG disintegrating tablet Take 1 tablet (4 mg total) by mouth every 8 (eight) hours as needed for nausea or vomiting. 08/05/19   Charlann Lange, PA-C  oxyCODONE-acetaminophen (PERCOCET) 7.5-325 MG tablet as needed. 03/31/20   [provider]  pantoprazole (PROTONIX) 40 MG tablet Take 1 tablet (40 mg total) by mouth 2 (two) times daily. 05/02/17 08/04/28  Elgergawy, Silver Huguenin, MD  Phenazopyridine HCl (PYRIDIUM PO) Take by mouth as needed.    [provider]  Polyethyl Glycol-Propyl Glycol (SYSTANE) 0.4-0.3 % GEL ophthalmic gel Place 1 application into both eyes daily as needed (dryness).    [provider]  potassium chloride (K-DUR) 10 MEQ tablet Take 10 mEq by mouth daily as needed.     [provider]  pravastatin (PRAVACHOL) 40 MG tablet Take 40 mg by mouth daily.    [provider]  prednisoLONE acetate (PRED FORTE) 1 % ophthalmic suspension Place 1 drop into the left eye as needed.    [provider]  predniSONE (STERAPRED UNI-PAK 21 TAB) 5 MG (21) TBPK tablet Take by mouth.  07/05/21   [provider]  promethazine (PHENERGAN) 25 MG suppository Place 1 suppository (25 mg total) rectally every 6 (six) hours as needed for nausea or vomiting. 08/05/19   Charlann Lange, PA-C  Promethazine HCl (PHENERGAN PO) Take by mouth as needed.    [provider]  SUMAtriptan (IMITREX) 25 MG tablet Take 25 mg by mouth every 2 (two) hours as needed for migraine. May repeat in 2 hours if headache persists or recurs.    [provider]  topiramate (TOPAMAX) 100 MG tablet Take 100 mg by mouth daily. 06/18/19   [provider]  topiramate (TOPAMAX) 25 MG tablet Take 25 mg by mouth at bedtime.     [provider]  traZODone (DESYREL) 150 MG tablet Take 150 mg by mouth at bedtime as needed for sleep.    [provider]  triamcinolone lotion (KENALOG) 0.1 % APPLY TOPICALLY TO SCALP AT BEDTIME AS DIRECTED 07/07/20   [provider]  valACYclovir (VALTREX) 1000 MG tablet Take 1 tablet (1,000 mg total) by mouth daily. 08/12/20   Truman Hayward, MD    Allergies    Other, Sulfa antibiotics, Sulfonamide derivatives, and Benadryl [diphenhydramine hcl]  Review of Systems   Review of Systems  Constitutional:  Positive for fever.  Respiratory:  Positive for cough.   Cardiovascular:  Positive for chest pain.  Gastrointestinal:  Positive for nausea.  All other systems reviewed and are negative.  Physical Exam Updated Vital Signs BP 96/68   Pulse 95   Temp 98.1 F (36.7 C) (Oral)   Resp (!) 24   SpO2 99%   Physical Exam Vitals and nursing note reviewed.  Constitutional:      General: She is not in acute distress.    Appearance: Normal appearance. She is well-developed.  HENT:     Head: Normocephalic and atraumatic.     Right Ear: Hearing normal.     Left Ear: Hearing normal.     Nose: Nose normal.  Eyes:     Conjunctiva/sclera: Conjunctivae normal.     Pupils: Pupils are equal, round, and reactive to light.   Cardiovascular:     Rate and Rhythm: Regular rhythm. Tachycardia present.     Heart sounds: S1 normal and S2 normal. No murmur heard.  No friction rub. No gallop.  Pulmonary:     Effort: Pulmonary effort is normal. No respiratory distress.     Breath sounds: Normal breath sounds.  Chest:     Chest wall: No tenderness.  Abdominal:     General: Bowel sounds are normal.     Palpations: Abdomen is soft.     Tenderness: There is no abdominal tenderness. There is no guarding or rebound. Negative signs include Murphy's sign and McBurney's sign.     Hernia: No hernia is present.  Musculoskeletal:        General: Normal range of motion.     Cervical back: Normal range of motion and neck supple.  Skin:    General: Skin is warm and dry.     Findings: No rash.  Neurological:     Mental Status: She is alert and oriented to person, place, and time.     GCS: GCS eye subscore is 4. GCS verbal subscore is 5. GCS motor subscore is 6.     Cranial Nerves: No cranial nerve deficit.     Sensory: No sensory deficit.     Coordination: Coordination normal.  Psychiatric:        Speech: Speech normal.        Behavior: Behavior normal.        Thought Content: Thought content normal.    ED Results / Procedures / Treatments   Labs (all labs ordered are listed, but only abnormal results are displayed) Labs Reviewed  CBC WITH DIFFERENTIAL/PLATELET - Abnormal; Notable for the following components:      Result Value   WBC 12.7 (*)    Neutro Abs 9.1 (*)    Abs Immature Granulocytes 0.08 (*)    All other components within normal limits  COMPREHENSIVE METABOLIC PANEL - Abnormal; Notable for the following components:   Glucose, Bld 113 (*)    All other components within normal limits  URINALYSIS, ROUTINE W REFLEX MICROSCOPIC  LIPASE, BLOOD  TROPONIN I (HIGH SENSITIVITY)  TROPONIN I (HIGH SENSITIVITY)    EKG EKG Interpretation  Date/Time:  Saturday July 24 2021 17:39:09 EDT Ventricular Rate:   122 PR Interval:  148 QRS Duration: 72 QT Interval:  296 QTC Calculation: 421 R Axis:   4 Text Interpretation: Sinus tachycardia Otherwise normal ECG Confirmed by Orpah Greek [phone]) on 07/25/2021 12:56:08 AM  Radiology DG Chest 1 View  Result Date: 07/24/2021 CLINICAL DATA:  Cough.  Recent COVID-19. EXAM: CHEST  1 VIEW COMPARISON:  June 22, 2021 FINDINGS: The heart size and mediastinal contours are within normal limits. Both lungs are clear. The visualized skeletal structures are unremarkable. IMPRESSION: No active disease. Electronically Signed   By: Dorise Bullion III M.D   On: 07/24/2021 20:02   CT Angio Chest Pulmonary Embolism (PE) W or WO Contrast  Result Date: 07/25/2021 CLINICAL DATA:  45 year old female positive for COVID-19 approximately 3 weeks ago, persistent cough with phlegm. Shortness of breath and left side chest pain radiating to the back. EXAM: CT ANGIOGRAPHY CHEST WITH CONTRAST TECHNIQUE: Multidetector CT imaging of the chest was performed using the standard protocol during bolus administration of intravenous contrast. Multiplanar CT image reconstructions and MIPs were obtained to evaluate the vascular anatomy. CONTRAST:  20m OMNIPAQUE IOHEXOL 350 MG/ML SOLN COMPARISON:  Chest radiograph 07/24/2021. CTA chest 06/22/2021 and earlier. FINDINGS: Cardiovascular: Excellent contrast bolus timing in the pulmonary arterial tree. No focal filling defect identified in the pulmonary arteries to suggest acute pulmonary embolism. No cardiomegaly or  pericardial effusion.  Negative visible aorta. Mediastinum/Nodes: Negative.  No mediastinal lymphadenopathy. Lungs/Pleura: Lower lung volumes compared to June. Mild dependent atelectasis in both lungs. Small 4 mm left lower lobe lung nodule on series 6, image 80 is stable since 2018 and benign. Major airways remain patent. No consolidation or infectious appearing opacity. No pleural effusion. Upper Abdomen: Negative visible liver,  spleen, adrenal glands, pancreas and bowel in the upper abdomen. Musculoskeletal: Negative. Other findings: Left chest cardiac loop recorder redemonstrated. Review of the MIP images confirms the above findings. IMPRESSION: 1. Negative for acute pulmonary embolus. 2. Lower lung volumes with mild pulmonary atelectasis. No other acute finding in the chest. Electronically Signed   By: Genevie Ann M.D.   On: 07/25/2021 05:11    Procedures Procedures   Medications Ordered in ED Medications  sodium chloride 0.9 % bolus 1,000 mL (1,000 mLs Intravenous New Bag/Given 07/25/21 0237)  ondansetron (ZOFRAN) injection 4 mg (4 mg Intravenous Given 07/25/21 0235)  iohexol (OMNIPAQUE) 350 MG/ML injection 50 mL (50 mLs Intravenous Contrast Given 07/25/21 0452)    ED Course  I have reviewed the triage vital signs and the nursing notes.  Pertinent labs & imaging results that were available during my care of the patient were reviewed by me and considered in my medical decision making (see chart for details).    MDM Rules/Calculators/A&P                           Patient presents with persistent cough, chest pain, intermittent fevers in the setting of recent COVID-19 diagnosis.  Patient complaining of a sharp pain in her left chest that worsens with coughing, taking deep breaths.  She does have a history of DVT, not currently on anticoagulation.  Cardiac evaluation is reassuring.  She was tachycardic and mildly hypotensive at arrival.  This is improved with IV fluids.  Patient underwent CT angiography to further evaluate for the possibility of PE.  Lungs are clear, no PE, no pneumonia noted.  Patient's pain is likely secondary to inflammatory changes of her recent URI.  Final Clinical Impression(s) / ED Diagnoses Final diagnoses:  Pleurisy    Rx / DC Orders ED Discharge Orders     None        Jeanett Antonopoulos, Gwenyth Allegra, MD 07/25/21 [phone]

## 2021-07-25 NOTE — ED Notes (Signed)
Pt c/o nausea and dehydration to this RN. Dr. Betsey Holiday made aware

## 2021-07-25 NOTE — ED Notes (Signed)
IV team at pt bedside 

## 2021-07-29 ENCOUNTER — Other Ambulatory Visit: Payer: Self-pay

## 2021-07-29 ENCOUNTER — Inpatient Hospital Stay (HOSPITAL_COMMUNITY): Admission: RE | Admit: 2021-07-29 | Payer: 59 | Source: Ambulatory Visit

## 2021-07-29 ENCOUNTER — Ambulatory Visit (INDEPENDENT_AMBULATORY_CARE_PROVIDER_SITE_OTHER): Payer: 59

## 2021-07-29 ENCOUNTER — Encounter (HOSPITAL_COMMUNITY): Payer: Self-pay

## 2021-07-29 VITALS — BP 117/84 | HR 88 | Temp 98.4°F | Resp 16

## 2021-07-29 DIAGNOSIS — D806 Antibody deficiency with near-normal immunoglobulins or with hyperimmunoglobulinemia: Secondary | ICD-10-CM

## 2021-07-29 MED ORDER — DEXTROSE 5 % IV SOLN: INTRAVENOUS | Status: DC

## 2021-07-29 MED ORDER — ACETAMINOPHEN 325 MG PO TABS
650.0000 mg | ORAL_TABLET | Freq: Once | ORAL | Status: AC
  Administered 2021-07-29: 650 mg via ORAL
  Filled 2021-07-29: qty 2

## 2021-07-29 MED ORDER — LORATADINE 10 MG PO TABS
10.0000 mg | ORAL_TABLET | Freq: Every day | ORAL | Status: DC
  Administered 2021-07-29: 10 mg via ORAL

## 2021-07-29 MED ORDER — IMMUNE GLOBULIN (HUMAN) 5 GM/50ML IV SOLN
30.0000 g | Freq: Once | INTRAVENOUS | Status: AC
  Administered 2021-07-29: 30 g via INTRAVENOUS

## 2021-07-29 NOTE — Progress Notes (Signed)
Diagnosis: Antibody deficiency syndrome  Provider:  Marshell Garfinkel, MD  Procedure: Infusion  IV Type: Peripheral, IV Location: R Forearm  IVIG (Immune Globulin), Dose: 30 mg  Infusion Start Time: 1100  Infusion Stop Time: [id number]  Post Infusion IV Care: Observation period completed  Discharge: Condition: Good, Destination: Home . AVS provided to patient.   Performed by:  Paul Dykes, RN

## 2021-08-19 ENCOUNTER — Encounter: Payer: Self-pay | Admitting: Pulmonary Disease

## 2021-08-26 ENCOUNTER — Other Ambulatory Visit: Payer: Self-pay

## 2021-08-26 ENCOUNTER — Ambulatory Visit (INDEPENDENT_AMBULATORY_CARE_PROVIDER_SITE_OTHER): Payer: BC Managed Care – PPO

## 2021-08-26 VITALS — BP 105/72 | HR 73 | Temp 98.4°F | Resp 18

## 2021-08-26 DIAGNOSIS — D806 Antibody deficiency with near-normal immunoglobulins or with hyperimmunoglobulinemia: Secondary | ICD-10-CM

## 2021-08-26 MED ORDER — DEXTROSE 5 % IV SOLN: INTRAVENOUS | Status: DC

## 2021-08-26 MED ORDER — ACETAMINOPHEN 325 MG PO TABS
650.0000 mg | ORAL_TABLET | Freq: Once | ORAL | Status: AC
  Administered 2021-08-26: 650 mg via ORAL
  Filled 2021-08-26: qty 2

## 2021-08-26 MED ORDER — IMMUNE GLOBULIN (HUMAN) 5 GM/50ML IV SOLN
30.0000 g | Freq: Once | INTRAVENOUS | Status: AC
  Administered 2021-08-26: 30 g via INTRAVENOUS
  Filled 2021-08-26: qty 300

## 2021-08-26 MED ORDER — LORATADINE 10 MG PO TABS
10.0000 mg | ORAL_TABLET | Freq: Every day | ORAL | Status: DC
  Administered 2021-08-26: 10 mg via ORAL
  Filled 2021-08-26: qty 1

## 2021-08-26 NOTE — Progress Notes (Signed)
Diagnosis: antibody deficiency syndrome  Provider:  Marshell Garfinkel, MD  Procedure: Infusion  IV Type: Peripheral, IV Location: R Hand  IVIG (Immune Globulin), Dose: 30 mg  Infusion Start Time: [id number]  Infusion Stop Time: 1214  Post Infusion IV Care: Observation period completed and Peripheral IV Discontinued  Discharge: Condition: Good, Destination: Home . AVS provided to patient.   Performed by:  Charlie Pitter, RN

## 2021-08-31 NOTE — Progress Notes (Signed)
Office Visit Note  Patient: [name] Horn             Date of Birth: 01-20-1976           MRN: [id number]             PCP: Alan Ripper, [place] Referring: Alan Ripper, [place] Visit Date: 09/14/2021 Occupation: '@GUAROCC'$ @  Subjective:  Joint  stiffness.   History of Present Illness: [name] Horn is a 45 y.o. female with a history of seropositive rheumatoid arthritis.  She has been off methotrexate for several months now.  She states she continues to have pain and stiffness in her multiple joints.  She has not noticed any joint swelling.  She continues to get IVIG through Dr. Tommy Medal.  She had lost to follow-up at [facility] due to insurance issues.  She states that she will start going to [place] again as her insurance issues have resolved.  Patient states she developed COVID-19 infection in June 2022.  She was very sick and had post COVID syndrome with increased chest tightness and shortness of breath.  She continues to have shortness of breath since then.  She states that she has not seen Dr. Katy Fitch since the spring.  Although she does have some atropine eyedrops.  She used atropine eyedrops this morning in her left eye.  Activities of Daily Living:  Patient reports morning stiffness for 30-45 minutes.   Patient Reports nocturnal pain.  Difficulty dressing/grooming: Reports Difficulty climbing stairs: Reports Difficulty getting out of chair: Reports Difficulty using hands for taps, buttons, cutlery, and/or writing: Reports  Review of Systems  Constitutional:  Positive for fatigue.  HENT:  Positive for mouth dryness. Negative for mouth sores and nose dryness.   Eyes:  Positive for dryness. Negative for redness.  Respiratory:  Positive for shortness of breath and difficulty breathing.   Cardiovascular:  Positive for chest pain and swelling in legs/feet.  Gastrointestinal:  Negative for blood in stool, constipation and diarrhea.  Endocrine: Negative for increased urination.   Genitourinary:  Negative for difficulty urinating.  Musculoskeletal:  Positive for joint pain, joint pain, myalgias, morning stiffness, muscle tenderness and myalgias. Negative for joint swelling.  Skin:  Positive for color change. Negative for rash and sensitivity to sunlight.  Allergic/Immunologic: Positive for susceptible to infections.  Neurological:  Positive for dizziness, tremors, numbness and weakness. Negative for headaches.  Hematological:  Positive for bruising/bleeding tendency. Negative for swollen glands.  Psychiatric/Behavioral:  Positive for depressed mood and sleep disturbance. The patient is nervous/anxious.    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   Chronic rhinitis 04/23/2020   Adverse food reaction [id number]   Eosinophilic esophagitis [id number]   Oral candidiasis 04/02/2020   History of Clostridium difficile infection 04/02/2020   History of shingles 04/02/2020   History of asthma 04/02/2020   Multiple drug allergies 04/02/2020   Environmental allergies 04/02/2020   History of loop recorder 01/19/2020  PVC (premature ventricular contraction) 01/19/2020   Uterine cancer ([place]) 01/19/2020   Colitis 08/14/2019   Constipation    Gastroparesis 04/03/2019   Palpitations 04/10/2018   Nausea and vomiting 04/26/2017   Nausea & vomiting 04/25/2017   Chest pain 04/25/2017    Enterocolitis due to Clostridium difficile 01/04/2016   Incisional hernia without obstruction or gangrene 11/18/2015   Personal history of other diseases of the nervous system and sense organs 10/14/2015   Disease of pancreas 01/21/2015   Other specified bacterial agents as the cause of diseases classified elsewhere 01/21/2015   Radiculopathy of cervical region 12/09/2014   Brachial neuritis 12/09/2014   Cervical radicular pain 12/09/2014   Annual physical exam 01/30/2012   Melena 01/30/2012   Pain in ankle 11/24/2010   ANXIETY 10/11/2010   DVT 10/11/2010   Generalized anxiety disorder 10/11/2010   COSTOCHONDRITIS 03/03/2008   Cellulitis of toe 12/01/2007   MENOPAUSE, PREMATURE 07/20/2007   UTERINE CANCER, HX OF 07/16/2007   Other postprocedural status(V45.89) 07/16/2007    Past Medical History:  Diagnosis Date   Cellulitis, leg [id number]   Complication of anesthesia    " i TAKE A LIITLE BIT LONGER TO WAKRE UP "   COVID-19 07/06/2021   DVT (deep venous thrombosis) (HCC)    x2   Fatigue 12/30/2020   Gastroparesis 08/07/2019   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   Herpes labialis 08/12/2020   Intertrigo 06/22/2020   Leukopenia 07/06/2021   Lymphedema    Malaise 12/30/2020   RA (rheumatoid arthritis) ([place])    Rheumatoid arthritis ([place]) 08/12/2020   Transaminitis 08/12/2020   Uterine cancer ([place])     Family History  Problem Relation Age of Onset   Hypertension Other    Hyperlipidemia Other    Colon cancer Other        grandmother   Hashimoto's thyroiditis Mother    Eczema Mother    Angioedema Mother    Throat cancer Father    Arrhythmia Father    Angioedema Maternal Grandfather    Past Surgical History:  Procedure Laterality Date   APPENDECTOMY     cheek biopsy  11/2020   CHOLECYSTECTOMY     ESOPHAGOGASTRODUODENOSCOPY N/A 04/30/2017   Procedure: ESOPHAGOGASTRODUODENOSCOPY (EGD);  Surgeon: Teena Irani, MD;  Location: [facility] ENDOSCOPY;  Service: Endoscopy;  Laterality: N/A;   HERNIA  REPAIR     x2   KNEE SURGERY     x3   MINIMALLY INVASIVE FORAMINOTOMY CERVICAL SPINE     C6-T1, N[place]   SAVORY DILATION N/A 04/30/2017   Procedure: SAVORY DILATION;  Surgeon: Teena Irani, MD;  Location: [place];  Service: Endoscopy;  Laterality: N/A;   TONSILLECTOMY     TOTAL ABDOMINAL HYSTERECTOMY     Sarcoma, s/p XRT   Social History   Social History Narrative   Not on file   Immunization History  Administered Date(s) Administered   Influenza, Seasonal, Injecte, Preservative Fre 10/22/2014, 10/23/2015, 09/10/2018   Influenza,inj,Quad PF,6+ Mos 11/21/2017, 09/10/2018   Influenza-Unspecified 10/22/2014, 10/23/2015   PFIZER(Purple Top)SARS-COV-2 Vaccination 12/27/2019, 02/24/2020, 09/09/2020, 03/09/2021   Pneumococcal Conjugate-13 10/30/2019   Tdap 04/19/2020     Objective: Vital Signs: BP 109/74 (BP Location: Right Arm, Patient Position: Sitting, Cuff Size: Normal)   Pulse 86   Ht '5\' 4"'$  (1.626 m)   Wt 186 lb 9.6 oz (84.6 kg)   BMI 32.03 kg/m    Physical Exam Vitals and nursing note reviewed.  Constitutional:      Appearance: She is well-developed.  HENT:  Head: Normocephalic and atraumatic.  Eyes:     Conjunctiva/sclera: Conjunctivae normal.     Comments: Left pupil dilated   Cardiovascular:     Rate and Rhythm: Normal rate and regular rhythm.     Heart sounds: Normal heart sounds.  Pulmonary:     Effort: Pulmonary effort is normal.     Breath sounds: Normal breath sounds.  Abdominal:     General: Bowel sounds are normal.     Palpations: Abdomen is soft.  Musculoskeletal:     Cervical back: Normal range of motion.  Lymphadenopathy:     Cervical: No cervical adenopathy.  Skin:    General: Skin is warm and dry.     Capillary Refill: Capillary refill takes less than 2 seconds.  Neurological:     Mental Status: She is alert and oriented to person, place, and time.  Psychiatric:        Behavior: Behavior normal.     Musculoskeletal Exam: She had  normal lateral rotation of her cervical spine.  She had difficulty with extension of her C-spine due to muscle tightness.  Shoulder joints, elbow joints, wrist joints, MCPs PIPs and DIPs with good range of motion with no synovitis.  Hip joints, knee joints, ankles, MTPs with good range of motion with no synovitis.  CDAI Exam: CDAI Score: 0.2  Patient Global: 2 mm; Provider Global: 0 mm Swollen: 0 ; Tender: 0  Joint Exam 09/14/2021   No joint exam has been documented for this visit   There is currently no information documented on the homunculus. Go to the Rheumatology activity and complete the homunculus joint exam.  Investigation: No additional findings.  Imaging: No results found.  Recent Labs: Lab Results  Component Value Date   WBC 12.7 (H) 07/24/2021   HGB 14.4 07/24/2021   PLT 245 07/24/2021   NA 139 07/24/2021   K 4.2 07/24/2021   CL 110 07/24/2021   CO2 22 07/24/2021   GLUCOSE 113 (H) 07/24/2021   BUN 7 07/24/2021   CREATININE 0.88 07/24/2021   BILITOT 0.8 07/24/2021   ALKPHOS 82 07/24/2021   AST 23 07/24/2021   ALT 18 07/24/2021   PROT 7.4 07/24/2021   ALBUMIN 3.7 07/24/2021   CALCIUM 10.0 07/24/2021   GFRAA 98 12/30/2020   QFTBGOLDPLUS NEGATIVE 10/23/2019    Speciality Comments: No specialty comments available.  Procedures:  No procedures performed Allergies: Other, Sulfa antibiotics, Sulfonamide derivatives, Benadryl [diphenhydramine hcl], and Silicone   Assessment / Plan:     Visit Diagnoses: Rheumatoid arthritis involving multiple sites with positive rheumatoid factor (HCC)-she had synovitis in the past.  She has not had any synovitis in the last several months.  She has been off methotrexate since 2021.  All the joints with good range of motion today.  Patient wants to start on methotrexate again.  I am not convinced that methotrexate is necessary without any evidence of inflammation.  I will refer her to [facility] rheumatology for second opinion.  High  risk medication use - she was treated with methotrexate in the past due to joint pain and joint swelling.  Methotrexate was discontinued due to frequent oral ulcers.  She has not had any oral ulcers recently.  Keratoconjunctivitis sicca (HCC) - Followed by Dr. Katy Fitch for recurrent conjunctivitis and keratoconjunctivitis.  Patient states she has intermittent irritation in her eye.  She felt that her left eye was irritated this morning and she used atropine drops.  Her left pupil was dilated today.  I  will get records from her ophthalmologist.  She has an appointment coming up in October.  AVISE index -2.4, all labs were negative except RF IgA 39.  I later received records from Dr. Zenia Resides office.  On the chart review the diagnosis is conjunctivitis, keratoconjunctivitis sicca, punctate keratitis, blepharitis.  I do not see diagnosis related to autoimmune disease.  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  Raynaud's syndrome without gangrene-she gives history of intermittent symptoms especially when she is in the chordal temperatures.  No nailbed capillary changes were noted.  No sclerodactyly was noted.  She had good capillary refill.  Geographic tongue - And stomatitis.  Throat cultures negative for fungi and strep A on 04/02/21. She had no response to IVIG.   Eosinophilic esophagitis - Hx of dilatations. Responsive to oral prednisone.  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 Tommy Medal.  History of recurrent/frequent infections.  She is on IVIG treatment.  She was also seen by [facility] immunology.  I reviewed notes from Dr. Tommy Medal.  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   Orders: No orders of  the defined types were placed in this encounter.  No orders of the defined types were placed in this encounter.    Follow-Up Instructions: Return in about 3 months (around 12/14/2021) for Rheumatoid arthritis.   Bo Merino, MD  Note - This record has been created using Editor, commissioning.  Chart creation errors have been sought, but may not always  have been located. Such creation errors do not reflect on  the standard of medical care.

## 2021-09-10 ENCOUNTER — Encounter: Payer: Self-pay | Admitting: Pulmonary Disease

## 2021-09-10 ENCOUNTER — Telehealth: Payer: Self-pay | Admitting: Pharmacy Technician

## 2021-09-10 NOTE — Telephone Encounter (Addendum)
Auth Submission: PENDING  Payer: BCBS Medication & CPT/J Code(s) submitted: Privigen (IVIG) J1459 Route of submission (phone, fax, portal): COVER MY MEDS Auth type: Buy/Bill Units/visits requested: 30GM Reference number: [id number]  INSURANCE CARD: [employer] ID# [id number] GR: [id number] BIN: [id number]  Patient has $7000 deductilbe which has not been met. Out of Pocket: $8700. ($25.19 has been met) Co-insurance: 50%  Left v/m with patient in regards to [place] (PAP) [phone] Patient may be eligible for assistance. Awaiting call back.   Will update once we receive a response.

## 2021-09-13 NOTE — Telephone Encounter (Addendum)
Auth Follow-up: DENIED Payer: [place] of follow up: portal/phone/fax: COVER MY MEDS Reference number: [id number] Medication & CPT/J Code(s) : Privigen (IVIG) J1459 Units/visits requested: 30 GMS  Authorization has been DENIED due to:  did not meet medical necessity  Awaiting response/instructions from MD (Dr. Tommy Medal)

## 2021-09-14 ENCOUNTER — Encounter: Payer: Self-pay | Admitting: Pulmonary Disease

## 2021-09-14 ENCOUNTER — Encounter: Payer: Self-pay | Admitting: Rheumatology

## 2021-09-14 ENCOUNTER — Ambulatory Visit (INDEPENDENT_AMBULATORY_CARE_PROVIDER_SITE_OTHER): Payer: BC Managed Care – PPO | Admitting: Infectious Disease

## 2021-09-14 ENCOUNTER — Other Ambulatory Visit: Payer: Self-pay

## 2021-09-14 ENCOUNTER — Encounter: Payer: Self-pay | Admitting: Infectious Disease

## 2021-09-14 ENCOUNTER — Ambulatory Visit (INDEPENDENT_AMBULATORY_CARE_PROVIDER_SITE_OTHER): Payer: BC Managed Care – PPO | Admitting: Rheumatology

## 2021-09-14 VITALS — BP 119/74 | HR 85 | Temp 98.5°F | Wt 187.0 lb

## 2021-09-14 VITALS — BP 109/74 | HR 86 | Ht 64.0 in | Wt 186.6 lb

## 2021-09-14 DIAGNOSIS — M0579 Rheumatoid arthritis with rheumatoid factor of multiple sites without organ or systems involvement: Secondary | ICD-10-CM | POA: Diagnosis not present

## 2021-09-14 DIAGNOSIS — K200 Eosinophilic esophagitis: Secondary | ICD-10-CM | POA: Diagnosis not present

## 2021-09-14 DIAGNOSIS — K141 Geographic tongue: Secondary | ICD-10-CM | POA: Diagnosis not present

## 2021-09-14 DIAGNOSIS — D808 Other immunodeficiencies with predominantly antibody defects: Secondary | ICD-10-CM

## 2021-09-14 DIAGNOSIS — G5601 Carpal tunnel syndrome, right upper limb: Secondary | ICD-10-CM

## 2021-09-14 DIAGNOSIS — Z889 Allergy status to unspecified drugs, medicaments and biological substances status: Secondary | ICD-10-CM

## 2021-09-14 DIAGNOSIS — L03116 Cellulitis of left lower limb: Secondary | ICD-10-CM

## 2021-09-14 DIAGNOSIS — B370 Candidal stomatitis: Secondary | ICD-10-CM

## 2021-09-14 DIAGNOSIS — Z8542 Personal history of malignant neoplasm of other parts of uterus: Secondary | ICD-10-CM

## 2021-09-14 DIAGNOSIS — D72810 Lymphocytopenia: Secondary | ICD-10-CM

## 2021-09-14 DIAGNOSIS — D806 Antibody deficiency with near-normal immunoglobulins or with hyperimmunoglobulinemia: Secondary | ICD-10-CM

## 2021-09-14 DIAGNOSIS — Z79899 Other long term (current) drug therapy: Secondary | ICD-10-CM

## 2021-09-14 DIAGNOSIS — H16229 Keratoconjunctivitis sicca, not specified as Sjogren's, unspecified eye: Secondary | ICD-10-CM

## 2021-09-14 DIAGNOSIS — B001 Herpesviral vesicular dermatitis: Secondary | ICD-10-CM

## 2021-09-14 DIAGNOSIS — K3184 Gastroparesis: Secondary | ICD-10-CM

## 2021-09-14 DIAGNOSIS — Z8659 Personal history of other mental and behavioral disorders: Secondary | ICD-10-CM

## 2021-09-14 DIAGNOSIS — M3501 Sicca syndrome with keratoconjunctivitis: Secondary | ICD-10-CM

## 2021-09-14 DIAGNOSIS — M05719 Rheumatoid arthritis with rheumatoid factor of unspecified shoulder without organ or systems involvement: Secondary | ICD-10-CM

## 2021-09-14 DIAGNOSIS — M7918 Myalgia, other site: Secondary | ICD-10-CM

## 2021-09-14 DIAGNOSIS — I7300 Raynaud's syndrome without gangrene: Secondary | ICD-10-CM

## 2021-09-14 DIAGNOSIS — U071 COVID-19: Secondary | ICD-10-CM

## 2021-09-14 DIAGNOSIS — L03115 Cellulitis of right lower limb: Secondary | ICD-10-CM

## 2021-09-14 DIAGNOSIS — F411 Generalized anxiety disorder: Secondary | ICD-10-CM

## 2021-09-14 DIAGNOSIS — Z8619 Personal history of other infectious and parasitic diseases: Secondary | ICD-10-CM

## 2021-09-14 DIAGNOSIS — L03119 Cellulitis of unspecified part of limb: Secondary | ICD-10-CM

## 2021-09-14 DIAGNOSIS — M5030 Other cervical disc degeneration, unspecified cervical region: Secondary | ICD-10-CM

## 2021-09-14 HISTORY — DX: Cellulitis of unspecified part of limb: L03.119

## 2021-09-14 MED ORDER — CEPHALEXIN 500 MG PO CAPS: 500.0000 mg | ORAL_CAPSULE | Freq: Four times a day (QID) | ORAL | 1 refills | Status: AC

## 2021-09-14 NOTE — Telephone Encounter (Signed)
(  FYI NOTE) PRIVIGEN [employer] (PAP)  Faxed denial letter to [employer] awaiting response/review from [place]: [phone] PHONE: [phone]  Will f/u with response.

## 2021-09-14 NOTE — Progress Notes (Signed)
Subjective:   Chief complaint concerned about infection in her lower extremities     Patient ID: [name] Horn, female    DOB: [birth year], 45 y.o.   MRN: [id number]  HPI   45 year old Caucasian lady with hx of seropositive RA with keratoconjunctivitis sicca, and osteoarthritis.who  Started on MTX in November of  2020. She also  Has hx of eosinophilic esophagitis and was on fluticasone. She has had history of C difficile with 3 episodes. She  Had problems with recurrent strep throat and had tonsillectomy, pneumonia a few times, ear infection, tooth abscess. She  Has had  Shingles outbreak already. She then began to have thrush in January  which was attributed to her  Oral corticosteroid therapy but  Has persisted despite this being stopped.   She was taking nystatin swish and  Swallow and previously twice weekly fluconazole at 200 mg  While her  Overall immunoglobulin levels were normal she did not mount immune response to vaccination with pneumovax and prevnar with antibodies checked > 4 months after this that were low.  She was bitten by a cat an then  had tetanus vaccine in ER and antibiotics.  After 2 COVID vaccines she had not much of a response in terms of flu like  Symptoms etc that are typically experienced by patients with 2 dose mRNA based vaccines.   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  During one visit several visits ago she appeared to be suffering from intertrigo.  I increased her fluconazole 200 mg daily prophylactic dose and a higher dose  She then  continued to have abnormalities on her tongue but it was not clear to me that this is really thrush but could be something else such as geographic tongue for example.  I ended up taking her off of the fluconazole with desire to see how she did off of it.  She ended up developing stomatitis but missed her appointment with  Dr. Megan Salon was late again and then finally did see him he did a swab for fungal culture which is growing copious Candida albicans   Sensitivity data on her candida showed:  organism CANDIDA ALBICANS   Antimicrobial 1 FLUCONAZOLE   MIC 0.500 S   Antimicrobial 2 VORICONAZOLE   MIC <=0.008 S   Antimicrobial 3 POSACONAZOLE   MIC 0.030   Antimicrobial 4 CASPOFUNGIN   MIC 0.015 S   Comment: .    She remained on fluconazole at high dose since then and was seen by Terri Piedra in the interim.  Her tongue lesions had not resolved though she was telling me that her tongue was "completely pink in week prior to one visit.  Again I have felt over and over again that her story fits much better with geographic tongue then with candidiasis.  I  have wanted to observe her again off of antifungal therapy.     She was then OFF of azole therapy and saw Dr. Redmond Baseman who did perform a biopsy but not of the tongue but rather the buccal mucosa where she had more inflammation and possible thrush.  Biopsy came back without malignancy, hyperkeratosis with some yeast as well   I then suggested that we stop the methotrexate as it could potentially be contributing to her oral lesions.  It is continue to be held by rheumatology   Since then she has had much  less frequent outbreaks of these lesions on her tongue and inner buccal mucosa.  Also been seen [facility] by an allergist and immunologist there who is working her up for immunodeficiency.  She has remained on her IVIG which the immunologist at [facility] was aware of and wanted her apparently to remain on it in the interim.   I continued to have skepticism and to voice skepticism that this is due to Candida.  She was told to come into my office or ENT to have a swab sent for fungal cultures if she was having the beginning of another outbreak.  We sent cultures on 8 April for both routine cultures and fungal cultures and neither 1 yielded  an organism, specifically with no yeast whatsoever even on fungal culture were isolated.  Seen by Dr. Caro Hight at [facility].  He was also skeptical that these lesions had something to do with yeast and thought they are more likely rheumatological in nature.  He was more supportive of restarting methotrexate and if the lesions worsen exploring other immunosuppressive therapy.  He also asked immunology to consider Crypopyrinh-associated periodic syndromes (CAPS), including [place]. This family    She then saw my partner Dr. Hatcher \\and  he also agreed that she did not likely have a candidal infection.  He prescribed Magic mouthwash for this.  In the interim the patient has had IVIG infusion yesterday.    Christy then  had another biopsy with ENT, and Dr. 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  Lucia Gaskins also recently suffered from COVID-19 infection and was in the ER where she received an immunoglobulin infusion.  She said that she had symptoms of fever sinus congestion sore throat and some confusion.  She then had a protracted cough and was on a prednisone taper  Her tongue lesions seem fairly stable at present.  She was due to see immunology at [facility] in August but lost insurance and could not be seen.  She has been seen by neurology to work-up her problems with head drop and headaches.  Unfortunately her new insurance will not cover her IVIG without prior authorization and apparently there may be still be a high co-pay.  She does not have active lesions on her tongue at present beyond the scalloping that is present frequently.  She did fall and injure her legs and has some sores that she was prescribed cephalexin 503 times daily for.    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) 08/12/2020   Transaminitis 08/12/2020   Uterine cancer [facility])     Past Surgical History:  Procedure Laterality Date   APPENDECTOMY     cheek biopsy  11/2020   CHOLECYSTECTOMY     ESOPHAGOGASTRODUODENOSCOPY N/A 04/30/2017   Procedure: ESOPHAGOGASTRODUODENOSCOPY (EGD);  Surgeon: 18/05/2017, MD;  Location: [facility];  Service:  Endoscopy;  Laterality: N/A;   HERNIA REPAIR     x2   KNEE SURGERY     x3   MINIMALLY INVASIVE FORAMINOTOMY CERVICAL SPINE     C6-T1, Nitka   SAVORY DILATION N/A 04/30/2017   Procedure: SAVORY DILATION;  Surgeon: Teena Irani, MD;  Location: [place];  Service: Endoscopy;  Laterality: N/A;   TONSILLECTOMY     TOTAL ABDOMINAL HYSTERECTOMY     Sarcoma, s/p XRT    Family History  Problem Relation Age of Onset   Hypertension Other    Hyperlipidemia Other    Colon cancer Other        grandmother   Hashimoto's thyroiditis Mother    Eczema Mother    Angioedema Mother    Throat cancer Father    Arrhythmia Father    Angioedema Maternal Grandfather       Social History   Socioeconomic History   Marital status: Married    Spouse name: Not on file   Number of children: Not on file   Years of education: Not on file   Highest education level: Not on file  Occupational History   Not on file  Tobacco Use   Smoking status: Never    Passive exposure: Yes   Smokeless tobacco: Never  Vaping Use   Vaping Use: Never used  Substance and Sexual Activity   Alcohol use: Not Currently   Drug use: No   Sexual activity: Yes    Partners: Male     Birth control/protection: None  Other Topics Concern   Not on file  Social History Narrative   Not on file   Social Determinants of Health   Financial Resource Strain: Not on file  Food Insecurity: Not on file  Transportation Needs: Not on file  Physical Activity: Not on file  Stress: Not on file  Social Connections: Not on file    Allergies  Allergen Reactions   Other     Dermabond surgical glue.    Sulfa Antibiotics Anaphylaxis, Shortness Of Breath, Swelling and Rash    Angioedema (also)   Sulfonamide Derivatives Hives, Shortness Of Breath and Swelling    TONGUE SWELLS   Benadryl [Diphenhydramine Hcl] Other (See Comments)    Hyperactivity      Current Outpatient Medications:    amphetamine-dextroamphetamine (ADDERALL) 20 MG tablet, Take 20 mg by mouth 3 (three) times daily., Disp: , Rfl:    atropine 1 % ophthalmic solution, Place 1 drop into the left eye daily as needed (for dry eye)., Disp: , Rfl:    baclofen (LIORESAL) 10 MG tablet, Take 10 mg by mouth daily., Disp: , Rfl:    benzonatate (TESSALON) 100 MG capsule, Take 1 capsule (100 mg total) by mouth every 8 (eight) hours as needed for cough., Disp: 21 capsule, Rfl: 0   Bepotastine Besilate 1.5 % SOLN, Place 1 drop into both eyes 2 (two) times a day., Disp: , Rfl:    cyclobenzaprine (FLEXERIL) 10 MG tablet, Take 1 tablet by mouth 3 (three) times daily as needed., Disp: , Rfl:    cycloSPORINE (RESTASIS) 0.05 % ophthalmic emulsion, Place 1 drop into both eyes 2 (two) times daily., Disp: , Rfl:    dicyclomine (BENTYL) 10 MG capsule, Take 10 mg by mouth daily as needed for nausea/vomiting., Disp: , Rfl:    famotidine (PEPCID) 40 MG tablet, Take 40 mg by mouth at bedtime as needed., Disp: , Rfl:    folic acid (FOLVITE) 1 MG tablet, Take 2 tablets (  2 mg total) by mouth daily., Disp: 180 tablet, Rfl: 3   furosemide (LASIX) 20 MG tablet, Take 20 mg by mouth daily as needed. , Disp: , Rfl:    LORazepam (ATIVAN) 1 MG tablet,  Take 1 mg by mouth 3 (three) times daily as needed., Disp: , Rfl:    magic mouthwash (nystatin, lidocaine, diphenhydrAMINE, alum & mag hydroxide) suspension, Swish and spit 5 mLs 3 (three) times daily as needed for mouth pain., Disp: 180 mL, Rfl: 1   meloxicam (MOBIC) 15 MG tablet, Take 1 tablet (15 mg total) by mouth daily., Disp: 14 tablet, Rfl: 0   metoCLOPramide (REGLAN) 10 MG tablet, Take 10 mg by mouth daily. , Disp: , Rfl:    metoprolol succinate (TOPROL-XL) 25 MG 24 hr tablet, Take 25 mg by mouth daily., Disp: , Rfl:    nystatin (MYCOSTATIN) 100000 UNIT/ML suspension, Take 10 mLs (1,000,000 Units total) by mouth 3 (three) times daily., Disp: 60 mL, Rfl: 2   ondansetron (ZOFRAN-ODT) 4 MG disintegrating tablet, Take 1 tablet (4 mg total) by mouth every 8 (eight) hours as needed for nausea or vomiting., Disp: 15 tablet, Rfl: 0   oxyCODONE-acetaminophen (PERCOCET) 7.5-325 MG tablet, as needed., Disp: , Rfl:    pantoprazole (PROTONIX) 40 MG tablet, Take 1 tablet (40 mg total) by mouth 2 (two) times daily., Disp: 60 tablet, Rfl: 1   Phenazopyridine HCl (PYRIDIUM PO), Take by mouth as needed., Disp: , Rfl:    Polyethyl Glycol-Propyl Glycol (SYSTANE) 0.4-0.3 % GEL ophthalmic gel, Place 1 application into both eyes daily as needed (dryness)., Disp: , Rfl:    potassium chloride (K-DUR) 10 MEQ tablet, Take 10 mEq by mouth daily as needed. , Disp: , Rfl:    pravastatin (PRAVACHOL) 40 MG tablet, Take 40 mg by mouth daily., Disp: , Rfl:    prednisoLONE acetate (PRED FORTE) 1 % ophthalmic suspension, Place 1 drop into the left eye as needed., Disp: , Rfl:    predniSONE (STERAPRED UNI-PAK 21 TAB) 5 MG (21) TBPK tablet, Take by mouth., Disp: , Rfl:    promethazine (PHENERGAN) 25 MG suppository, Place 1 suppository (25 mg total) rectally every 6 (six) hours as needed for nausea or vomiting., Disp: 12 each, Rfl: 0   Promethazine HCl (PHENERGAN PO), Take by mouth as needed., Disp: , Rfl:    SUMAtriptan  (IMITREX) 25 MG tablet, Take 25 mg by mouth every 2 (two) hours as needed for migraine. May repeat in 2 hours if headache persists or recurs., Disp: , Rfl:    topiramate (TOPAMAX) 100 MG tablet, Take 100 mg by mouth daily., Disp: , Rfl:    topiramate (TOPAMAX) 25 MG tablet, Take 25 mg by mouth at bedtime. , Disp: , Rfl:    traZODone (DESYREL) 150 MG tablet, Take 150 mg by mouth at bedtime as needed for sleep., Disp: , Rfl:    triamcinolone lotion (KENALOG) 0.1 %, APPLY TOPICALLY TO SCALP AT BEDTIME AS DIRECTED, Disp: , Rfl:    valACYclovir (VALTREX) 1000 MG tablet, Take 1 tablet (1,000 mg total) by mouth daily., Disp: 30 tablet, Rfl: 5     Review of Systems  Constitutional:  Negative for activity change, appetite change, chills, diaphoresis, fatigue, fever and unexpected weight change.  HENT:  Negative for congestion, rhinorrhea, sinus pressure, sneezing, sore throat and trouble swallowing.   Eyes:  Negative for photophobia and visual disturbance.  Respiratory:  Positive for cough. Negative for chest tightness, shortness of breath, wheezing and stridor.  Cardiovascular:  Negative for chest pain, palpitations and leg swelling.  Gastrointestinal:  Negative for abdominal distention, abdominal pain, anal bleeding, blood in stool, constipation, diarrhea, nausea and vomiting.  Genitourinary:  Negative for difficulty urinating, dysuria, flank pain and hematuria.  Musculoskeletal:  Negative for arthralgias, back pain, gait problem, joint swelling and myalgias.  Skin:  Positive for color change and wound. Negative for pallor and rash.  Neurological:  Negative for dizziness, tremors, weakness and light-headedness.  Hematological:  Negative for adenopathy. Does not bruise/bleed easily.  Psychiatric/Behavioral:  Negative for agitation, behavioral problems, confusion, decreased concentration, dysphoric mood and sleep disturbance.       Objective:   Physical Exam Constitutional:      General: She is  not in acute distress.    Appearance: She is not diaphoretic.  HENT:     Head: Normocephalic and atraumatic.     Right Ear: External ear normal.     Left Ear: External ear normal.     Nose: Nose normal.     Mouth/Throat:     Pharynx: No oropharyngeal exudate.  Eyes:     General: No scleral icterus.       Right eye: No discharge.        Left eye: No discharge.     Extraocular Movements: Extraocular movements intact.     Conjunctiva/sclera: Conjunctivae normal.  Cardiovascular:     Rate and Rhythm: Normal rate and regular rhythm.     Heart sounds:    Friction rub present.  Pulmonary:     Effort: Pulmonary effort is normal. No respiratory distress.     Breath sounds: No wheezing.  Abdominal:     General: There is no distension.     Palpations: Abdomen is soft.  Musculoskeletal:        General: No tenderness. Normal range of motion.     Cervical back: Normal range of motion and neck supple.  Lymphadenopathy:     Cervical: No cervical adenopathy.  Skin:    General: Skin is warm and dry.     Coloration: Skin is not jaundiced or pale.     Findings: No erythema, lesion or rash.  Neurological:     General: No focal deficit present.     Mental Status: She is alert and oriented to person, place, and time.  Psychiatric:        Attention and Perception: Attention and perception normal.        Mood and Affect: Mood is anxious.        Behavior: Behavior normal.        Thought Content: Thought content normal.        Cognition and Memory: Cognition and memory normal.        Judgment: Judgment normal.   Op lesions from photograph she showed me several visits ago.    ]  Tongue  August 12, 2020:    Lips when seen by Dr. 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  I do not know that we have compelling enough reason to continue it.  I had been mainly continuing it so that her immunologist would be able to reevaluate her while she was on it.  Currently her appointment with immunologist at [facility] is not until February.  We will see what a prior authorization can do but if she still has quite high co-pay is a understand she does not feel she meets her deductible this will not be affordable  RA: to see Dr Gypsy Lore today  COVID prevention encouraged her to get by Valent COVID-19 vaccine

## 2021-09-19 ENCOUNTER — Encounter (HOSPITAL_COMMUNITY): Payer: Self-pay | Admitting: Emergency Medicine

## 2021-09-19 ENCOUNTER — Other Ambulatory Visit: Payer: Self-pay

## 2021-09-19 ENCOUNTER — Emergency Department (HOSPITAL_COMMUNITY)
Admission: EM | Admit: 2021-09-19 | Discharge: 2021-09-19 | Disposition: A | Discharge status: Home / Self Care | Payer: BC Managed Care – PPO | Attending: Emergency Medicine | Admitting: Emergency Medicine

## 2021-09-19 ENCOUNTER — Emergency Department (HOSPITAL_COMMUNITY): Payer: BC Managed Care – PPO

## 2021-09-19 DIAGNOSIS — Z8616 Personal history of COVID-19: Secondary | ICD-10-CM | POA: Diagnosis not present

## 2021-09-19 DIAGNOSIS — Z8541 Personal history of malignant neoplasm of cervix uteri: Secondary | ICD-10-CM | POA: Diagnosis not present

## 2021-09-19 DIAGNOSIS — J45909 Unspecified asthma, uncomplicated: Secondary | ICD-10-CM | POA: Insufficient documentation

## 2021-09-19 DIAGNOSIS — S52502A Unspecified fracture of the lower end of left radius, initial encounter for closed fracture: Secondary | ICD-10-CM | POA: Insufficient documentation

## 2021-09-19 DIAGNOSIS — W010XXA Fall on same level from slipping, tripping and stumbling without subsequent striking against object, initial encounter: Secondary | ICD-10-CM | POA: Insufficient documentation

## 2021-09-19 DIAGNOSIS — Z79899 Other long term (current) drug therapy: Secondary | ICD-10-CM | POA: Diagnosis not present

## 2021-09-19 DIAGNOSIS — S6992XA Unspecified injury of left wrist, hand and finger(s), initial encounter: Secondary | ICD-10-CM | POA: Diagnosis present

## 2021-09-19 MED ORDER — HYDROCODONE-ACETAMINOPHEN 5-325 MG PO TABS
1.0000 | ORAL_TABLET | Freq: Once | ORAL | Status: AC
  Administered 2021-09-19: 1 via ORAL
  Filled 2021-09-19: qty 1

## 2021-09-19 NOTE — ED Provider Notes (Signed)
Emergency Medicine Provider Triage Evaluation Note  [name] Horn , a 45 y.o. female  was evaluated in triage.  Pt complains of left wrist pain since last night.  [place] on it last night and landed on it.  No prior fracture, dislocation or procedure in the area.  Pain is worse with movement.  No weakness or numbness.  Review of Systems  Positive: Left wrist pain Negative: Weakness, numbness  Physical Exam  There were no vitals taken for this visit. Gen:   Awake, no distress   Resp:  Normal effort  MSK:   Moves extremities without difficulty  Other:  Diffuse tenderness of the left wrist without deformities.  2+ radial pulse palpated  Medical Decision Making  Medically screening exam initiated at 10:57 AM.  Appropriate orders placed.  Kloi BAYLIN CABAL was informed that the remainder of the evaluation will be completed by another provider, this initial triage assessment does not replace that evaluation, and the importance of remaining in the ED until their evaluation is complete.  X-ray ordered   Delia Heady, PA-C 09/19/21 1101    Isla Pence, MD 09/19/21 1352

## 2021-09-19 NOTE — ED Provider Notes (Signed)
[facility] EMERGENCY DEPARTMENT Provider Note   CSN: [id number] Arrival date & time: 09/19/21  1037     History Chief Complaint  Patient presents with   Wrist Pain    [name] Horn is a 45 y.o. female.  45 year old female presents with complaint of left wrist pain.  Patient states that she got tripped up on all the cats at her house today and fell onto her left hand last night.  Patient is right-hand dominant.  No other injuries.  Patient has been ambulatory since the fall without difficulty.      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   Herpes labialis 08/12/2020   Intertrigo 06/22/2020   Leukopenia 07/06/2021   Lymphedema    Malaise 12/30/2020   RA (rheumatoid arthritis) ([place])    Rheumatoid arthritis ([place]) 08/12/2020   Transaminitis 08/12/2020   Uterine cancer [facility])     Patient Active Problem List   Diagnosis Date Noted   Cellulitis, leg 09/14/2021   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   COVID-19 07/06/2021   Leukopenia 07/06/2021   Fatigue 12/30/2020   Malaise 12/30/2020   FUO (fever of unknown origin) 10/05/2020   Stomatitis 09/03/2020   Rheumatoid arthritis ([place]) 08/12/2020   Herpes labialis 08/12/2020   Transaminitis 08/12/2020   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   Intertrigo 06/22/2020   Interstitial cystitis 05/18/2020   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   Chronic rhinitis 04/23/2020   Adverse food reaction 16/38/4665   Eosinophilic esophagitis 99/35/7017   Oral candidiasis 04/02/2020   History of Clostridium difficile infection 04/02/2020   History of shingles 04/02/2020    History of asthma 04/02/2020   Multiple drug allergies 04/02/2020   Environmental allergies 04/02/2020   History of loop recorder 01/19/2020   PVC (premature ventricular contraction) 01/19/2020   Uterine cancer ([place]) 01/19/2020   Colitis 08/14/2019   Constipation    Gastroparesis 04/03/2019   Palpitations 04/10/2018   Nausea and vomiting 04/26/2017   Nausea & vomiting 04/25/2017   Chest pain 04/25/2017   Enterocolitis due to Clostridium difficile 01/04/2016   Incisional hernia without obstruction or gangrene 11/18/2015   Personal history of other diseases of the nervous system and sense organs 10/14/2015   Disease of pancreas 01/21/2015   Other specified bacterial agents as the cause of diseases classified elsewhere 01/21/2015   Radiculopathy of cervical region 12/09/2014   Brachial neuritis 12/09/2014   Cervical radicular pain 12/09/2014   Annual physical exam 01/30/2012   Melena 01/30/2012   Pain in ankle 11/24/2010   ANXIETY 10/11/2010   DVT 10/11/2010   Generalized anxiety disorder 10/11/2010   COSTOCHONDRITIS 03/03/2008   Cellulitis of toe 12/01/2007   MENOPAUSE, PREMATURE 07/20/2007   UTERINE CANCER, HX OF 07/16/2007   Other postprocedural status(V45.89) 07/16/2007    Past Surgical History:  Procedure Laterality Date   APPENDECTOMY     cheek biopsy  11/2020   CHOLECYSTECTOMY     ESOPHAGOGASTRODUODENOSCOPY N/A 04/30/2017   Procedure: ESOPHAGOGASTRODUODENOSCOPY (EGD);  Surgeon: Teena Irani, MD;  Location: [facility] ENDOSCOPY;  Service: Endoscopy;  Laterality: N/A;   HERNIA REPAIR  x2   KNEE SURGERY     x3   MINIMALLY INVASIVE FORAMINOTOMY CERVICAL SPINE     C6-T1, Nitka   SAVORY DILATION N/A 04/30/2017   Procedure: SAVORY DILATION;  Surgeon: Teena Irani, MD;  Location: [facility];  Service: Endoscopy;  Laterality: N/A;   TONSILLECTOMY     TOTAL ABDOMINAL HYSTERECTOMY     Sarcoma, s/p XRT     OB History   No obstetric history on file.     Family History   Problem Relation Age of Onset   Hypertension Other    Hyperlipidemia Other    Colon cancer Other        grandmother   Hashimoto's thyroiditis Mother    Eczema Mother    Angioedema Mother    Throat cancer Father    Arrhythmia Father    Angioedema Maternal Grandfather     Social History   Tobacco Use   Smoking status: Never    Passive exposure: Yes   Smokeless tobacco: Never  Vaping Use   Vaping Use: Never used  Substance Use Topics   Alcohol use: Not Currently   Drug use: No    Home Medications Prior to Admission medications   Medication Sig Start Date End Date Taking? Authorizing Provider  amphetamine-dextroamphetamine (ADDERALL) 20 MG tablet Take 20 mg by mouth 3 (three) times daily.    [provider]  atropine 1 % ophthalmic solution Place 1 drop into the left eye daily as needed (for dry eye).    [provider]  baclofen (LIORESAL) 10 MG tablet Take 10 mg by mouth daily. 02/28/20   [provider]  benzonatate (TESSALON) 100 MG capsule Take 1 capsule (100 mg total) by mouth every 8 (eight) hours as needed for cough. Patient not taking: Reported on 09/14/2021 06/22/21   Barrie Folk, PA-C  Bepotastine Besilate 1.5 % SOLN Place 1 drop into both eyes 2 (two) times a day.    [provider]  cephALEXin (KEFLEX) 500 MG capsule Take 1 capsule (500 mg total) by mouth 4 (four) times daily for 10 days. 09/14/21 09/24/21  Truman Hayward, MD  cyclobenzaprine (FLEXERIL) 10 MG tablet Take 1 tablet by mouth 3 (three) times daily as needed. Patient not taking: Reported on 09/14/2021 05/05/21   [provider]  cycloSPORINE (RESTASIS) 0.05 % ophthalmic emulsion Place 1 drop into both eyes 2 (two) times daily.    [provider]  dicyclomine (BENTYL) 10 MG capsule Take 10 mg by mouth daily as needed for nausea/vomiting. 01/02/19   [provider]  famotidine (PEPCID) 40 MG tablet Take 40 mg by mouth at bedtime as  needed. 05/02/21   [provider]  folic acid (FOLVITE) 1 MG tablet Take 2 tablets (2 mg total) by mouth daily. 10/30/19   Bo Merino, MD  furosemide (LASIX) 20 MG tablet Take 20 mg by mouth daily as needed.     [provider]  LORazepam (ATIVAN) 1 MG tablet Take 1 mg by mouth 3 (three) times daily as needed. 03/08/20   [provider]  magic mouthwash (nystatin, lidocaine, diphenhydrAMINE, alum & mag hydroxide) suspension Swish and spit 5 mLs 3 (three) times daily as needed for mouth pain. 06/10/21   Truman Hayward, MD  meloxicam (MOBIC) 15 MG tablet Take 1 tablet (15 mg total) by mouth daily. Patient not taking: Reported on 09/14/2021 07/25/21   Orpah Greek, MD  metoCLOPramide (REGLAN) 10 MG tablet Take 10 mg  by mouth daily.     [provider]  metoprolol succinate (TOPROL-XL) 25 MG 24 hr tablet Take 25 mg by mouth daily.    [provider]  nystatin (MYCOSTATIN) 100000 UNIT/ML suspension Take 10 mLs (1,000,000 Units total) by mouth 3 (three) times daily. Patient not taking: Reported on 09/14/2021 12/07/20   Tommy Medal, Lavell Islam, MD  ondansetron (ZOFRAN-ODT) 4 MG disintegrating tablet Take 1 tablet (4 mg total) by mouth every 8 (eight) hours as needed for nausea or vomiting. 08/05/19   Charlann Lange, PA-C  oxyCODONE-acetaminophen (PERCOCET) 7.5-325 MG tablet as needed. 03/31/20   [provider]  pantoprazole (PROTONIX) 40 MG tablet Take 1 tablet (40 mg total) by mouth 2 (two) times daily. 05/02/17 08/04/28  Elgergawy, Silver Huguenin, MD  Phenazopyridine HCl (PYRIDIUM PO) Take by mouth as needed.    [provider]  Polyethyl Glycol-Propyl Glycol (SYSTANE) 0.4-0.3 % GEL ophthalmic gel Place 1 application into both eyes daily as needed (dryness).    [provider]  potassium chloride (K-DUR) 10 MEQ tablet Take 10 mEq by mouth daily as needed.     [provider]  pravastatin (PRAVACHOL) 40 MG tablet Take 40 mg  by mouth daily.    [provider]  prednisoLONE acetate (PRED FORTE) 1 % ophthalmic suspension Place 1 drop into the left eye as needed.    [provider]  predniSONE (STERAPRED UNI-PAK 21 TAB) 5 MG (21) TBPK tablet Take by mouth. Patient not taking: Reported on 09/14/2021 07/05/21   [provider]  promethazine (PHENERGAN) 25 MG suppository Place 1 suppository (25 mg total) rectally every 6 (six) hours as needed for nausea or vomiting. 08/05/19   Charlann Lange, PA-C  Promethazine HCl (PHENERGAN PO) Take by mouth as needed.    [provider]  SUMAtriptan (IMITREX) 25 MG tablet Take 25 mg by mouth every 2 (two) hours as needed for migraine. May repeat in 2 hours if headache persists or recurs.    [provider]  topiramate (TOPAMAX) 100 MG tablet Take 100 mg by mouth daily. 06/18/19   [provider]  topiramate (TOPAMAX) 25 MG tablet Take 25 mg by mouth at bedtime.     [provider]  traZODone (DESYREL) 150 MG tablet Take 150 mg by mouth at bedtime as needed for sleep.    [provider]  triamcinolone lotion (KENALOG) 0.1 % APPLY TOPICALLY TO SCALP AT BEDTIME AS DIRECTED 07/07/20   [provider]  valACYclovir (VALTREX) 1000 MG tablet Take 1 tablet (1,000 mg total) by mouth daily. 08/12/20   Truman Hayward, MD    Allergies    Other, Sulfa antibiotics, Sulfonamide derivatives, Benadryl [diphenhydramine hcl], and Silicone  Review of Systems   Review of Systems  Constitutional:  Negative for fever.  Musculoskeletal:  Positive for arthralgias and joint swelling. Negative for back pain, gait problem and neck pain.  Skin:  Negative for color change, rash and wound.  Allergic/Immunologic: Negative for immunocompromised state.  Neurological:  Negative for weakness, numbness and headaches.  Hematological:  Does not bruise/bleed easily.  Psychiatric/Behavioral:  Negative for confusion.   All other systems  reviewed and are negative.  Physical Exam Updated Vital Signs BP 115/63   Pulse (!) 110   Temp 99.8 F (37.7 C) (Oral)   Resp 12   Ht 5\' 4"  (1.626 m)   Wt 81.2 kg   SpO2 100%   BMI 30.73 kg/m   Physical Exam Vitals and  nursing note reviewed.  Constitutional:      General: She is not in acute distress.    Appearance: She is well-developed. She is not diaphoretic.  HENT:     Head: Normocephalic and atraumatic.  Cardiovascular:     Pulses: Normal pulses.  Pulmonary:     Effort: Pulmonary effort is normal.  Musculoskeletal:        General: Swelling, tenderness and signs of injury present.     Cervical back: No tenderness or bony tenderness.     Thoracic back: No tenderness or bony tenderness.     Lumbar back: No tenderness or bony tenderness.     Comments: Tenderness along the distal left radius.  Skin is intact, radial pulse present with brisk capillary refill present in each digit.  Sensation intact.  No pain at the elbow or shoulder.  No pain with logroll hips, palpation or range of motion of lower extremities or right upper extremity  Skin:    General: Skin is warm and dry.     Findings: No erythema or rash.  Neurological:     Mental Status: She is alert and oriented to person, place, and time.     Sensory: No sensory deficit.  Psychiatric:        Behavior: Behavior normal.    ED Results / Procedures / Treatments   Labs (all labs ordered are listed, but only abnormal results are displayed) Labs Reviewed - No data to display  EKG None  Radiology DG Wrist Complete Left  Result Date: 09/19/2021 CLINICAL DATA:  Fall with left wrist pain. EXAM: LEFT WRIST - COMPLETE 4 VIEW COMPARISON:  10/23/2019 FINDINGS: Subtle transverse fracture of the distal radius, across the metaphysis. No fracture comminution. No angulation of the distal radial articular surface. No other fractures.  Wrist joints are normally spaced and aligned. Mild dorsal soft tissue swelling. IMPRESSION:  1. Subtle nondisplaced, non comminuted transverse fracture of the distal left radial metaphysis. Electronically Signed   By: Lajean Manes M.D.   On: 09/19/2021 11:45    Procedures Procedures   Medications Ordered in ED Medications - No data to display  ED Course  I have reviewed the triage vital signs and the nursing notes.  Pertinent labs & imaging results that were available during my care of the patient were reviewed by me and considered in my medical decision making (see chart for details).  Clinical Course as of 09/19/21 1247  Sun Sep 19, 2524  8016 45 year old female presents with left wrist injury after a fall which occurred yesterday.  She has tenderness along the distal left radius.  Her x-ray shows a subtle nondisplaced, noncomminuted transverse fracture of the distal left radial metaphysis.  Plan is to place patient into a short arm volar splint with follow-up with orthopedics.  Patient is already in pain management, advised to continue with her home pain medications as prescribed. [LM]    Clinical Course User Index [LM] Roque Lias   MDM Rules/Calculators/A&P                          SPLINT APPLICATION Date/Time: 98:92 PM Authorized by: Tacy Learn Consent: Verbal consent obtained. Risks and benefits: risks, benefits and alternatives were discussed Consent given by: patient Splint applied by: Ander Purpura, orthopedic technician Location details: left arm Splint type: volar short arm Supplies used: fiberglass OCL, ace, webril Post-procedure: The splinted body part was neurovascularly unchanged following the procedure. Patient tolerance: Patient  tolerated the procedure well with no immediate complications.    Final Clinical Impression(s) / ED Diagnoses Final diagnoses:  Closed fracture of distal end of left radius, unspecified fracture morphology, initial encounter    Rx / DC Orders ED Discharge Orders     None        Tacy Learn,  PA-C 09/19/21 1247    Isla Pence, MD 09/19/21 1253

## 2021-09-19 NOTE — ED Notes (Signed)
Ortho tech made aware of short arm splint.

## 2021-09-19 NOTE — Progress Notes (Signed)
Orthopedic Tech Progress Note Patient Details:  [name] Horn 1976/07/23 [id number]   Ortho Devices Type of Ortho Device: Volar splint Ortho Device/Splint Location: LUE Ortho Device/Splint Interventions: Ordered, Application, Adjustment   Post Interventions Patient Tolerated: Well, Fair Instructions Provided: Care of device  [name] 09/19/2021, 1:35 PM

## 2021-09-19 NOTE — ED Triage Notes (Signed)
C/o L wrist pain since last night.  States she fell trying not to step on her cat.  CMS intact.

## 2021-09-19 NOTE — Discharge Instructions (Addendum)
Take home pain medications as prescribed.  Call ortho tomorrow to arrange follow up.  At home, elevate and apply ice for 20 minutes at a time.

## 2021-09-24 ENCOUNTER — Telehealth: Payer: Self-pay | Admitting: *Deleted

## 2021-09-24 ENCOUNTER — Ambulatory Visit: Payer: Self-pay

## 2021-09-24 NOTE — Telephone Encounter (Signed)
Kim from the infusion center on [address] is faxing a patient assistance form to [employer] that will need Dr Lucianne Lei Dam's signature. This is for IVIG administration. Landis Gandy, RN

## 2021-09-28 NOTE — Telephone Encounter (Signed)
Signed patient assistance forms faxed to infusion center (Fax: [phone]).   Deren Degrazia Lorita Officer, RN

## 2021-09-29 ENCOUNTER — Telehealth: Payer: Self-pay | Admitting: Pharmacy Technician

## 2021-09-29 NOTE — Telephone Encounter (Signed)
Dr. Tommy Medal,  I received two different patient assistance referral forms,  [employer] and [employer]. Please clarify which treatment you would like???  Thanks Kim.

## 2021-10-04 NOTE — Telephone Encounter (Addendum)
IVIG - FYI  [employer]  connect has made 2 attempts to speak with patient for courtsey call and introduction to program. Script will be sent [facility] [employer] [phone]    [employer] Connect: ID # [id number].

## 2021-10-08 NOTE — Telephone Encounter (Addendum)
Fyi note: Delivery/shipment has been set for Tues 10/12/21.  Will schedule patient as soon as possible.  FYI flag/excell sheet: updated.  (Per [employer] /[facility]: they still have not been able to reach patient  after 3 attempts and application process has been on hold. MD office is not allowed to start process, they must speak directly to patient. Meanwhile script has been sent [facility] [phone] to prevent delay in treament.) Must file appeal to proceed with PAP, otherwise patient will not qualify for assistance.  Determination to file appeal will be made after her f/u appt 11/10/21.  Will f/u with response.

## 2021-10-14 ENCOUNTER — Other Ambulatory Visit: Payer: Self-pay

## 2021-10-14 ENCOUNTER — Emergency Department (HOSPITAL_COMMUNITY)
Admission: EM | Admit: 2021-10-14 | Discharge: 2021-10-14 | Disposition: A | Discharge status: Home / Self Care | Payer: BC Managed Care – PPO | Attending: Emergency Medicine | Admitting: Emergency Medicine

## 2021-10-14 ENCOUNTER — Ambulatory Visit: Payer: BC Managed Care – PPO

## 2021-10-14 ENCOUNTER — Encounter (HOSPITAL_COMMUNITY): Payer: Self-pay | Admitting: Emergency Medicine

## 2021-10-14 ENCOUNTER — Emergency Department (HOSPITAL_COMMUNITY): Payer: BC Managed Care – PPO

## 2021-10-14 DIAGNOSIS — Z7722 Contact with and (suspected) exposure to environmental tobacco smoke (acute) (chronic): Secondary | ICD-10-CM | POA: Diagnosis not present

## 2021-10-14 DIAGNOSIS — W010XXA Fall on same level from slipping, tripping and stumbling without subsequent striking against object, initial encounter: Secondary | ICD-10-CM | POA: Insufficient documentation

## 2021-10-14 DIAGNOSIS — M25531 Pain in right wrist: Secondary | ICD-10-CM | POA: Diagnosis present

## 2021-10-14 DIAGNOSIS — J45909 Unspecified asthma, uncomplicated: Secondary | ICD-10-CM | POA: Insufficient documentation

## 2021-10-14 DIAGNOSIS — Z8616 Personal history of COVID-19: Secondary | ICD-10-CM | POA: Insufficient documentation

## 2021-10-14 DIAGNOSIS — Z8542 Personal history of malignant neoplasm of other parts of uterus: Secondary | ICD-10-CM | POA: Insufficient documentation

## 2021-10-14 NOTE — ED Triage Notes (Signed)
Pt c/o right wrist pain after a fall x 2 days ago. Denies hitting her head.

## 2021-10-14 NOTE — ED Provider Notes (Signed)
Emergency Medicine Provider Triage Evaluation Note  [name] Horn , a 45 y.o. female  was evaluated in triage.  Pt complains of right wrist pain.  Started a few days ago when she tripped over her cat.  She fell on her outstretched hand, having pain to the dorsal aspect of her right wrist since then.  Patient is in a cast on the left wrist due to similar fall.  Pain is worse when she twists her hand and opens doorknobs..  Review of Systems  Positive: Right wrist pain Negative: Syncope  Physical Exam  BP 126/81 (BP Location: Right Arm)   Pulse 80   Temp 98.7 F (37.1 C) (Oral)   Resp 16   SpO2 100%  Gen:   Awake, no distress   Resp:  Normal effort  MSK:   Decreased range of motion to the right wrist secondary to pain.  Good cap refill, radial pulse 2+ Other:    Medical Decision Making  Medically screening exam initiated at 5:10 PM.  Appropriate orders placed.  [name] Horn was informed that the remainder of the evaluation will be completed by another provider, this initial triage assessment does not replace that evaluation, and the importance of remaining in the ED until their evaluation is complete.  [name] Roers, PA-C 10/14/21 1711    Jeanell Sparrow, DO 10/16/21 1247

## 2021-10-14 NOTE — Telephone Encounter (Signed)
(  Fyi notes) [place]  Received phone call from [facility] stating [employer] order was cancelled.  Was given wrong information for shipping date.  Appeal must be on file/processed before shipment can be sent. [facility] will work on appeal process. Will f/u with response.

## 2021-10-14 NOTE — ED Notes (Signed)
Pt in [place]. XRAY tech stated they will bring pt to h2 when xray is complete.

## 2021-10-14 NOTE — Discharge Instructions (Addendum)
Take your pain medicine at home, take the anti-inflammatory medicine every 12 hours as needed.  Also continue icing.  Follow-up with your orthopedic doctor later this week as planned.  Return to the ED if things are not worsening or different.

## 2021-10-14 NOTE — ED Provider Notes (Signed)
[facility] EMERGENCY DEPARTMENT Provider Note   CSN: [id number] Arrival date & time: 10/14/21  1648     History Chief Complaint  Patient presents with   Wrist Pain    [name] Horn is a 45 y.o. female.   Wrist Pain   Patient with history notable for recent left wrist fracture presents with right wrist pain.  Patient had a fall 2 days ago when she tripped over her cat, the pain started acutely when she fell on an outstretched hand.  The pain has been constant, worsened when she twists and to open doorknobs.  She has tried Tylenol ibuprofen which is helped with the pain somewhat.  She denies any pain in her hand or her digits, no pain in the elbow or elsewhere in her body.  She did not hit her head when she fell.  Denies any numbness or tingling in the extremities.    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   Herpes labialis 08/12/2020   Intertrigo 06/22/2020   Leukopenia 07/06/2021   Lymphedema    Malaise 12/30/2020   RA (rheumatoid arthritis) ([place])    Rheumatoid arthritis ([place]) 08/12/2020   Transaminitis 08/12/2020   Uterine cancer [facility])     Patient Active Problem List   Diagnosis Date Noted   Cellulitis, leg 09/14/2021   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   COVID-19 07/06/2021   Leukopenia 07/06/2021   Fatigue 12/30/2020   Malaise 12/30/2020   FUO (fever of unknown origin) 10/05/2020   Stomatitis 09/03/2020   Rheumatoid arthritis ([place]) 08/12/2020   Herpes labialis 08/12/2020   Transaminitis 08/12/2020   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   Intertrigo 06/22/2020   Interstitial cystitis 05/18/2020   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/2020   Chronic rhinitis 04/23/2020   Adverse food reaction 27/02/5008   Eosinophilic esophagitis 38/18/2993   Oral candidiasis 04/02/2020   History of Clostridium difficile infection 04/02/2020   History of shingles 04/02/2020   History of asthma 04/02/2020   Multiple drug allergies 04/02/2020   Environmental allergies 04/02/2020   History of loop recorder 01/19/2020   PVC (premature ventricular contraction) 01/19/2020   Uterine cancer ([place]) 01/19/2020   Colitis 08/14/2019   Constipation    Gastroparesis 04/03/2019   Palpitations 04/10/2018   Nausea and vomiting 04/26/2017   Nausea & vomiting 04/25/2017   Chest pain 04/25/2017   Enterocolitis due to Clostridium difficile 01/04/2016   Incisional hernia without obstruction or gangrene 11/18/2015   Personal history of other diseases of the nervous system and sense organs 10/14/2015   Disease of pancreas 01/21/2015   Other specified bacterial agents as the cause of diseases classified elsewhere 01/21/2015   Radiculopathy of cervical region 12/09/2014   Brachial neuritis 12/09/2014   Cervical radicular pain 12/09/2014   Annual physical exam 01/30/2012   Melena 01/30/2012   Pain in ankle 11/24/2010   ANXIETY 10/11/2010   DVT 10/11/2010   Generalized anxiety disorder 10/11/2010   COSTOCHONDRITIS 03/03/2008   Cellulitis of toe 12/01/2007   MENOPAUSE, PREMATURE 07/20/2007   UTERINE CANCER, HX OF 07/16/2007   Other postprocedural status(V45.89) 07/16/2007  Past Surgical History:  Procedure Laterality Date   APPENDECTOMY     cheek biopsy  11/2020   CHOLECYSTECTOMY     ESOPHAGOGASTRODUODENOSCOPY N/A 04/30/2017   Procedure: ESOPHAGOGASTRODUODENOSCOPY (EGD);  Surgeon: Teena Irani, MD;  Location: [facility] ENDOSCOPY;  Service: Endoscopy;  Laterality: N/A;   HERNIA REPAIR     x2   KNEE SURGERY     x3   MINIMALLY INVASIVE FORAMINOTOMY CERVICAL SPINE     C6-T1, Nitka   SAVORY DILATION N/A 04/30/2017    Procedure: SAVORY DILATION;  Surgeon: Teena Irani, MD;  Location: [place];  Service: Endoscopy;  Laterality: N/A;   TONSILLECTOMY     TOTAL ABDOMINAL HYSTERECTOMY     Sarcoma, s/p XRT     OB History   No obstetric history on file.     Family History  Problem Relation Age of Onset   Hypertension Other    Hyperlipidemia Other    Colon cancer Other        grandmother   Hashimoto's thyroiditis Mother    Eczema Mother    Angioedema Mother    Throat cancer Father    Arrhythmia Father    Angioedema Maternal Grandfather     Social History   Tobacco Use   Smoking status: Never    Passive exposure: Yes   Smokeless tobacco: Never  Vaping Use   Vaping Use: Never used  Substance Use Topics   Alcohol use: Not Currently   Drug use: No    Home Medications Prior to Admission medications   Medication Sig Start Date End Date Taking? Authorizing Provider  amphetamine-dextroamphetamine (ADDERALL) 20 MG tablet Take 20 mg by mouth 3 (three) times daily.    [provider]  atropine 1 % ophthalmic solution Place 1 drop into the left eye daily as needed (for dry eye).    [provider]  baclofen (LIORESAL) 10 MG tablet Take 10 mg by mouth daily. 02/28/20   [provider]  benzonatate (TESSALON) 100 MG capsule Take 1 capsule (100 mg total) by mouth every 8 (eight) hours as needed for cough. Patient not taking: Reported on 09/14/2021 06/22/21   Barrie Folk, PA-C  Bepotastine Besilate 1.5 % SOLN Place 1 drop into both eyes 2 (two) times a day.    [provider]  cyclobenzaprine (FLEXERIL) 10 MG tablet Take 1 tablet by mouth 3 (three) times daily as needed. Patient not taking: Reported on 09/14/2021 05/05/21   [provider]  cycloSPORINE (RESTASIS) 0.05 % ophthalmic emulsion Place 1 drop into both eyes 2 (two) times daily.    [provider]  dicyclomine (BENTYL) 10 MG capsule Take 10 mg by mouth daily as needed for  nausea/vomiting. 01/02/19   [provider]  famotidine (PEPCID) 40 MG tablet Take 40 mg by mouth at bedtime as needed. 05/02/21   [provider]  folic acid (FOLVITE) 1 MG tablet Take 2 tablets (2 mg total) by mouth daily. 10/30/19   Bo Merino, MD  furosemide (LASIX) 20 MG tablet Take 20 mg by mouth daily as needed.     [provider]  LORazepam (ATIVAN) 1 MG tablet Take 1 mg by mouth 3 (three) times daily as needed. 03/08/20   [provider]  magic mouthwash (nystatin, lidocaine, diphenhydrAMINE, alum & mag hydroxide) suspension Swish and spit 5 mLs 3 (three) times daily as needed for mouth pain. 06/10/21   Truman Hayward, MD  meloxicam (MOBIC) 15 MG tablet Take 1 tablet (15  mg total) by mouth daily. Patient not taking: Reported on 09/14/2021 07/25/21   Orpah Greek, MD  metoCLOPramide (REGLAN) 10 MG tablet Take 10 mg by mouth daily.     [provider]  metoprolol succinate (TOPROL-XL) 25 MG 24 hr tablet Take 25 mg by mouth daily.    [provider]  nystatin (MYCOSTATIN) 100000 UNIT/ML suspension Take 10 mLs (1,000,000 Units total) by mouth 3 (three) times daily. Patient not taking: Reported on 09/14/2021 12/07/20   Tommy Medal, Lavell Islam, MD  ondansetron (ZOFRAN-ODT) 4 MG disintegrating tablet Take 1 tablet (4 mg total) by mouth every 8 (eight) hours as needed for nausea or vomiting. 08/05/19   Charlann Lange, PA-C  oxyCODONE-acetaminophen (PERCOCET) 7.5-325 MG tablet as needed. 03/31/20   [provider]  pantoprazole (PROTONIX) 40 MG tablet Take 1 tablet (40 mg total) by mouth 2 (two) times daily. 05/02/17 08/04/28  Elgergawy, Silver Huguenin, MD  Phenazopyridine HCl (PYRIDIUM PO) Take by mouth as needed.    [provider]  Polyethyl Glycol-Propyl Glycol (SYSTANE) 0.4-0.3 % GEL ophthalmic gel Place 1 application into both eyes daily as needed (dryness).    [provider]  potassium chloride (K-DUR) 10 MEQ  tablet Take 10 mEq by mouth daily as needed.     [provider]  pravastatin (PRAVACHOL) 40 MG tablet Take 40 mg by mouth daily.    [provider]  prednisoLONE acetate (PRED FORTE) 1 % ophthalmic suspension Place 1 drop into the left eye as needed.    [provider]  predniSONE (STERAPRED UNI-PAK 21 TAB) 5 MG (21) TBPK tablet Take by mouth. Patient not taking: Reported on 09/14/2021 07/05/21   [provider]  promethazine (PHENERGAN) 25 MG suppository Place 1 suppository (25 mg total) rectally every 6 (six) hours as needed for nausea or vomiting. 08/05/19   Charlann Lange, PA-C  Promethazine HCl (PHENERGAN PO) Take by mouth as needed.    [provider]  SUMAtriptan (IMITREX) 25 MG tablet Take 25 mg by mouth every 2 (two) hours as needed for migraine. May repeat in 2 hours if headache persists or recurs.    [provider]  topiramate (TOPAMAX) 100 MG tablet Take 100 mg by mouth daily. 06/18/19   [provider]  topiramate (TOPAMAX) 25 MG tablet Take 25 mg by mouth at bedtime.     [provider]  traZODone (DESYREL) 150 MG tablet Take 150 mg by mouth at bedtime as needed for sleep.    [provider]  triamcinolone lotion (KENALOG) 0.1 % APPLY TOPICALLY TO SCALP AT BEDTIME AS DIRECTED 07/07/20   [provider]  valACYclovir (VALTREX) 1000 MG tablet Take 1 tablet (1,000 mg total) by mouth daily. 08/12/20   Truman Hayward, MD    Allergies    Other, Sulfa antibiotics, Sulfonamide derivatives, Benadryl [diphenhydramine hcl], and Silicone  Review of Systems   Review of Systems  Constitutional:  Negative for fever.  Musculoskeletal:  Positive for arthralgias and myalgias.   Physical Exam Updated Vital Signs BP 126/81 (BP Location: Right Arm)   Pulse 80   Temp 98.7 F (37.1 C) (Oral)   Resp 16   SpO2 100%   Physical Exam Vitals and nursing note reviewed. Exam conducted with a chaperone present.   Constitutional:      General: She is not in acute distress.    Appearance: Normal appearance.  HENT:     Head: Normocephalic and atraumatic.  Eyes:  General: No scleral icterus.    Extraocular Movements: Extraocular movements intact.     Pupils: Pupils are equal, round, and reactive to light.  Cardiovascular:     Comments: Radial pulse 2+ Musculoskeletal:        General: Tenderness and signs of injury present. No swelling. Normal range of motion.     Comments: Decreased range of motion secondary to pain, patient is able to flex and extend all the ED digits without complication.  There is no bony tenderness, pain is not well localized.  No diffuse swelling or abrasions or erythema.  Skin:    Capillary Refill: Capillary refill takes less than 2 seconds.     Coloration: Skin is not jaundiced.  Neurological:     Mental Status: She is alert. Mental status is at baseline.     Coordination: Coordination normal.    ED Results / Procedures / Treatments   Labs (all labs ordered are listed, but only abnormal results are displayed) Labs Reviewed - No data to display  EKG None  Radiology DG Wrist Complete Right  Result Date: 10/14/2021 CLINICAL DATA:  Wrist pain EXAM: RIGHT WRIST - COMPLETE 3+ VIEW COMPARISON:  None. FINDINGS: There is no evidence of fracture or dislocation. There is no evidence of arthropathy or other focal bone abnormality. Soft tissues are unremarkable. IMPRESSION: Negative. Electronically Signed   By: Rolm Baptise M.D.   On: 10/14/2021 18:09    Procedures Procedures   Medications Ordered in ED Medications - No data to display  ED Course  I have reviewed the triage vital signs and the nursing notes.  Pertinent labs & imaging results that were available during my care of the patient were reviewed by me and considered in my medical decision making (see chart for details).    MDM Rules/Calculators/A&P                           Patient vitals are stable,  she is nontoxic-appearing.  She is neurovascularly intact, good radial pulse and good cap refill.  She has decreased range of motion secondary to pain.  Radiograph was ordered to assess for occult fracture, was negative.  Patient has follow-up with orthopedic surgery later this week for her left wrist, she will follow-up with him about the right wrist at the same time.  The patient to be taking anti-inflammatory medicine at home.  Return precautions given, patient discharged in stable position.  Final Clinical Impression(s) / ED Diagnoses Final diagnoses:  None    Rx / DC Orders ED Discharge Orders     None        Sherrill Raring, PA-C 10/14/21 1843    Lorelle Gibbs, DO 10/15/21 1536

## 2021-10-22 ENCOUNTER — Ambulatory Visit: Payer: Self-pay

## 2021-10-25 NOTE — Telephone Encounter (Signed)
Called to f/u with patient sending financial documents.  (No answer/no voicemail)

## 2021-11-10 ENCOUNTER — Ambulatory Visit: Payer: BC Managed Care – PPO | Admitting: Infectious Disease

## 2021-11-11 ENCOUNTER — Ambulatory Visit: Payer: BC Managed Care – PPO | Admitting: Infectious Disease

## 2021-11-13 ENCOUNTER — Ambulatory Visit (HOSPITAL_COMMUNITY)
Admission: EM | Admit: 2021-11-13 | Discharge: 2021-11-13 | Disposition: A | Discharge status: Home / Self Care | Payer: BC Managed Care – PPO | Attending: Physician Assistant | Admitting: Physician Assistant

## 2021-11-13 ENCOUNTER — Other Ambulatory Visit: Payer: Self-pay

## 2021-11-13 ENCOUNTER — Encounter (HOSPITAL_COMMUNITY): Payer: Self-pay | Admitting: Emergency Medicine

## 2021-11-13 DIAGNOSIS — Z20822 Contact with and (suspected) exposure to covid-19: Secondary | ICD-10-CM | POA: Insufficient documentation

## 2021-11-13 DIAGNOSIS — J029 Acute pharyngitis, unspecified: Secondary | ICD-10-CM | POA: Diagnosis not present

## 2021-11-13 DIAGNOSIS — I890 Lymphedema, not elsewhere classified: Secondary | ICD-10-CM | POA: Insufficient documentation

## 2021-11-13 DIAGNOSIS — R509 Fever, unspecified: Secondary | ICD-10-CM | POA: Insufficient documentation

## 2021-11-13 DIAGNOSIS — Z8616 Personal history of COVID-19: Secondary | ICD-10-CM | POA: Diagnosis not present

## 2021-11-13 DIAGNOSIS — J069 Acute upper respiratory infection, unspecified: Secondary | ICD-10-CM | POA: Diagnosis not present

## 2021-11-13 LAB — POCT RAPID STREP A, ED / UC: Streptococcus, Group A Screen (Direct): NEGATIVE

## 2021-11-13 LAB — POC INFLUENZA A AND B ANTIGEN (URGENT CARE ONLY)
INFLUENZA A ANTIGEN, POC: NEGATIVE
INFLUENZA B ANTIGEN, POC: NEGATIVE

## 2021-11-13 MED ORDER — OSELTAMIVIR PHOSPHATE 75 MG PO CAPS: 75.0000 mg | ORAL_CAPSULE | Freq: Two times a day (BID) | ORAL | 0 refills | Status: DC

## 2021-11-13 NOTE — ED Triage Notes (Signed)
11/12/2021 started having fever, sore throat , chills, and reports husband is recovering from flu shot. Patient reports she has a compromised immune system

## 2021-11-13 NOTE — ED Notes (Signed)
Nasal/throat swabs in lab

## 2021-11-13 NOTE — ED Provider Notes (Signed)
[place]    CSN: [id number] Arrival date & time: 11/13/21  1137      History   Chief Complaint Chief Complaint  Patient presents with   Sore Throat    HPI [name] Horn is a 45 y.o. female.   Patient presents today with a 24-hour history of URI symptoms.  Reports fever, severe sore throat, chills, body aches, nasal congestion.  Denies any chest pain, shortness of breath, nausea, vomiting.  She has taken over-the-counter medications without improvement of symptoms.  Reports her husband was recently diagnosed with the flu.  She has had an influenza vaccine but this was less than 2 weeks ago.  She reports taking antibiotics for cellulitis several months ago; start Keflex.  Reports recurrent skin infections related to lymphedema.  She does report some significant past medical history including immunodeficiency.  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  She has had COVID in the past most recently in July 2022.  She is interested in being tested to ensure that it is not COVID-19.  Reports she is having difficulty eating and drinking as result of severe sore throat.  Pain is rated 8 on a 0-10 pain scale, localized to posterior oropharynx, described as aching, worse with swallowing, no alleviating factors identified.   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   Herpes labialis 08/12/2020   Intertrigo 06/22/2020   Leukopenia 07/06/2021   Lymphedema    Malaise 12/30/2020   RA (rheumatoid arthritis) ([place])    Rheumatoid arthritis ([place]) 08/12/2020   Transaminitis 08/12/2020   Uterine cancer [facility])     Patient Active Problem List    Diagnosis Date Noted   Cellulitis, leg 09/14/2021   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   COVID-19 07/06/2021   Leukopenia 07/06/2021   Fatigue 12/30/2020   Malaise 12/30/2020   FUO (fever of unknown origin) 10/05/2020   Stomatitis 09/03/2020   Rheumatoid arthritis ([place]) 08/12/2020   Herpes labialis 08/12/2020   Transaminitis 08/12/2020   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   Intertrigo 06/22/2020   Interstitial cystitis 05/18/2020   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   Chronic rhinitis 04/23/2020   Adverse food reaction 12/75/1700   Eosinophilic esophagitis 17/49/4496   Oral candidiasis 04/02/2020   History of Clostridium difficile infection 04/02/2020   History of shingles 04/02/2020   History of asthma 04/02/2020   Multiple drug allergies 04/02/2020   Environmental allergies 04/02/2020   History of loop recorder 01/19/2020   PVC (premature ventricular contraction) 01/19/2020   Uterine cancer ([place]) 01/19/2020   Colitis 08/14/2019   Constipation    Gastroparesis 04/03/2019   Palpitations 04/10/2018   Nausea and vomiting 04/26/2017   Nausea & vomiting 04/25/2017   Chest pain 04/25/2017   Enterocolitis due to Clostridium difficile 01/04/2016   Incisional hernia without obstruction or gangrene 11/18/2015   Personal history of other diseases of the nervous system and sense organs 10/14/2015   Disease of pancreas 01/21/2015   Other specified bacterial agents as the cause of diseases  classified elsewhere 01/21/2015   Radiculopathy of cervical region 12/09/2014   Brachial neuritis 12/09/2014   Cervical radicular pain 12/09/2014   Annual physical exam 01/30/2012   Melena 01/30/2012   Pain in ankle 11/24/2010   ANXIETY 10/11/2010   DVT 10/11/2010   Generalized anxiety disorder 10/11/2010   COSTOCHONDRITIS 03/03/2008   Cellulitis of toe 12/01/2007    MENOPAUSE, PREMATURE 07/20/2007   UTERINE CANCER, HX OF 07/16/2007   Other postprocedural status(V45.89) 07/16/2007    Past Surgical History:  Procedure Laterality Date   APPENDECTOMY     cheek biopsy  11/2020   CHOLECYSTECTOMY     ESOPHAGOGASTRODUODENOSCOPY N/A 04/30/2017   Procedure: ESOPHAGOGASTRODUODENOSCOPY (EGD);  Surgeon: Teena Irani, MD;  Location: [facility] ENDOSCOPY;  Service: Endoscopy;  Laterality: N/A;   HERNIA REPAIR     x2   KNEE SURGERY     x3   MINIMALLY INVASIVE FORAMINOTOMY CERVICAL SPINE     C6-T1, Nitka   SAVORY DILATION N/A 04/30/2017   Procedure: SAVORY DILATION;  Surgeon: Teena Irani, MD;  Location: [place];  Service: Endoscopy;  Laterality: N/A;   TONSILLECTOMY     TOTAL ABDOMINAL HYSTERECTOMY     Sarcoma, s/p XRT    OB History   No obstetric history on file.      Home Medications    Prior to Admission medications   Medication Sig Start Date End Date Taking? Authorizing Provider  oseltamivir (TAMIFLU) 75 MG capsule Take 1 capsule (75 mg total) by mouth every 12 (twelve) hours. 11/13/21  Yes Jessey Stehlin K, PA-C  amphetamine-dextroamphetamine (ADDERALL) 20 MG tablet Take 20 mg by mouth 3 (three) times daily.    [provider]  atropine 1 % ophthalmic solution Place 1 drop into the left eye daily as needed (for dry eye).    [provider]  baclofen (LIORESAL) 10 MG tablet Take 10 mg by mouth daily. 02/28/20   [provider]  benzonatate (TESSALON) 100 MG capsule Take 1 capsule (100 mg total) by mouth every 8 (eight) hours as needed for cough. Patient not taking: Reported on 09/14/2021 06/22/21   Barrie Folk, PA-C  Bepotastine Besilate 1.5 % SOLN Place 1 drop into both eyes 2 (two) times a day.    [provider]  cyclobenzaprine (FLEXERIL) 10 MG tablet Take 1 tablet by mouth 3 (three) times daily as needed. Patient not taking: Reported on 09/14/2021 05/05/21   [provider]  cycloSPORINE (RESTASIS)  0.05 % ophthalmic emulsion Place 1 drop into both eyes 2 (two) times daily.    [provider]  dicyclomine (BENTYL) 10 MG capsule Take 10 mg by mouth daily as needed for nausea/vomiting. 01/02/19   [provider]  famotidine (PEPCID) 40 MG tablet Take 40 mg by mouth at bedtime as needed. 05/02/21   [provider]  folic acid (FOLVITE) 1 MG tablet Take 2 tablets (2 mg total) by mouth daily. 10/30/19   Bo Merino, MD  furosemide (LASIX) 20 MG tablet Take 20 mg by mouth daily as needed.     [provider]  LORazepam (ATIVAN) 1 MG tablet Take 1 mg by mouth 3 (three) times daily as needed. 03/08/20   [provider]  magic mouthwash (nystatin, lidocaine, diphenhydrAMINE, alum & mag hydroxide) suspension Swish and spit 5 mLs 3 (three) times daily as needed for mouth pain. 06/10/21   Truman Hayward, MD  meloxicam (MOBIC) 15 MG tablet Take 1 tablet (15 mg total) by mouth daily. Patient  not taking: Reported on 09/14/2021 07/25/21   Orpah Greek, MD  metoCLOPramide (REGLAN) 10 MG tablet Take 10 mg by mouth daily.     [provider]  metoprolol succinate (TOPROL-XL) 25 MG 24 hr tablet Take 25 mg by mouth daily.    [provider]  nystatin (MYCOSTATIN) 100000 UNIT/ML suspension Take 10 mLs (1,000,000 Units total) by mouth 3 (three) times daily. Patient not taking: Reported on 09/14/2021 12/07/20   Tommy Medal, Lavell Islam, MD  ondansetron (ZOFRAN-ODT) 4 MG disintegrating tablet Take 1 tablet (4 mg total) by mouth every 8 (eight) hours as needed for nausea or vomiting. 08/05/19   Charlann Lange, PA-C  oxyCODONE-acetaminophen (PERCOCET) 7.5-325 MG tablet as needed. 03/31/20   [provider]  pantoprazole (PROTONIX) 40 MG tablet Take 1 tablet (40 mg total) by mouth 2 (two) times daily. 05/02/17 08/04/28  Elgergawy, Silver Huguenin, MD  Phenazopyridine HCl (PYRIDIUM PO) Take by mouth as needed.    [provider]  Polyethyl  Glycol-Propyl Glycol (SYSTANE) 0.4-0.3 % GEL ophthalmic gel Place 1 application into both eyes daily as needed (dryness).    [provider]  potassium chloride (K-DUR) 10 MEQ tablet Take 10 mEq by mouth daily as needed.     [provider]  pravastatin (PRAVACHOL) 40 MG tablet Take 40 mg by mouth daily.    [provider]  prednisoLONE acetate (PRED FORTE) 1 % ophthalmic suspension Place 1 drop into the left eye as needed.    [provider]  predniSONE (STERAPRED UNI-PAK 21 TAB) 5 MG (21) TBPK tablet Take by mouth. Patient not taking: Reported on 09/14/2021 07/05/21   [provider]  promethazine (PHENERGAN) 25 MG suppository Place 1 suppository (25 mg total) rectally every 6 (six) hours as needed for nausea or vomiting. 08/05/19   Charlann Lange, PA-C  Promethazine HCl (PHENERGAN PO) Take by mouth as needed.    [provider]  SUMAtriptan (IMITREX) 25 MG tablet Take 25 mg by mouth every 2 (two) hours as needed for migraine. May repeat in 2 hours if headache persists or recurs.    [provider]  topiramate (TOPAMAX) 100 MG tablet Take 100 mg by mouth daily. 06/18/19   [provider]  topiramate (TOPAMAX) 25 MG tablet Take 25 mg by mouth at bedtime.     [provider]  traZODone (DESYREL) 150 MG tablet Take 150 mg by mouth at bedtime as needed for sleep. Patient not taking: Reported on 11/13/2021    [provider]  triamcinolone lotion (KENALOG) 0.1 % APPLY TOPICALLY TO SCALP AT BEDTIME AS DIRECTED 07/07/20   [provider]  valACYclovir (VALTREX) 1000 MG tablet Take 1 tablet (1,000 mg total) by mouth daily. 08/12/20   Truman Hayward, MD    Family History Family History  Problem Relation Age of Onset   Hypertension Other    Hyperlipidemia Other    Colon cancer Other        grandmother   Hashimoto's thyroiditis Mother    Eczema Mother    Angioedema Mother    Throat cancer Father     Arrhythmia Father    Angioedema Maternal Grandfather     Social History Social History   Tobacco Use   Smoking status: Never    Passive exposure: Yes   Smokeless tobacco: Never  Vaping Use   Vaping Use: Never used  Substance Use Topics   Alcohol use: Not Currently   Drug use: No  Allergies   Other, Sulfa antibiotics, Sulfonamide derivatives, Benadryl [diphenhydramine hcl], and Silicone   Review of Systems Review of Systems  Constitutional:  Positive for activity change, fatigue and fever. Negative for appetite change.  HENT:  Positive for congestion and sore throat. Negative for sinus pressure and sneezing.   Respiratory:  Negative for cough and shortness of breath.   Cardiovascular:  Negative for chest pain.  Gastrointestinal:  Negative for abdominal pain, diarrhea, nausea and vomiting.  Musculoskeletal:  Positive for arthralgias and myalgias.  Neurological:  Positive for headaches. Negative for dizziness and light-headedness.    Physical Exam Triage Vital Signs ED Triage Vitals  Enc Vitals Group     BP 11/13/21 1318 137/88     Pulse Rate 11/13/21 1318 (!) 113     Resp 11/13/21 1318 (!) 22     Temp 11/13/21 1318 (!) 100.9 F (38.3 C)     Temp Source 11/13/21 1318 Oral     SpO2 11/13/21 1318 95 %     Weight --      Height --      Head Circumference --      Peak Flow --      Pain Score 11/13/21 1315 8     Pain Loc --      Pain Edu? --      Excl. in [place]? --    No data found.  Updated Vital Signs BP 137/88 (BP Location: Left Arm)   Pulse (!) 113   Temp (!) 100.9 F (38.3 C) (Oral) Comment (Src): took tylenol around 9:30  Resp (!) 22   SpO2 95%   Visual Acuity Right Eye Distance:   Left Eye Distance:   Bilateral Distance:    Right Eye Near:   Left Eye Near:    Bilateral Near:     Physical Exam Vitals reviewed.  Constitutional:      General: She is awake. She is not in acute distress.    Appearance: Normal appearance. She is well-developed.  She is not ill-appearing.     Comments: Very pleasant female appears stated age no acute distress sitting comfortably in exam room  HENT:     Head: Normocephalic and atraumatic.     Right Ear: Tympanic membrane, ear canal and external ear normal. Tympanic membrane is not erythematous or bulging.     Left Ear: Tympanic membrane, ear canal and external ear normal. Tympanic membrane is not erythematous or bulging.     Nose:     Right Sinus: No maxillary sinus tenderness or frontal sinus tenderness.     Left Sinus: No maxillary sinus tenderness or frontal sinus tenderness.     Mouth/Throat:     Pharynx: Uvula midline. Posterior oropharyngeal erythema present. No oropharyngeal exudate.     Comments: Erythema and drainage in posterior oropharynx Cardiovascular:     Rate and Rhythm: Normal rate and regular rhythm.     Heart sounds: Normal heart sounds, S1 normal and S2 normal. No murmur heard. Pulmonary:     Effort: Pulmonary effort is normal.     Breath sounds: Normal breath sounds. No wheezing, rhonchi or rales.     Comments: Clear to auscultation bilaterally Psychiatric:        Behavior: Behavior is cooperative.     UC Treatments / Results  Labs (all labs ordered are listed, but only abnormal results are displayed) Labs Reviewed  SARS CORONAVIRUS 2 (TAT 6-24 HRS)  RESPIRATORY PANEL BY PCR  POCT RAPID STREP A, ED /  UC  POC INFLUENZA A AND B ANTIGEN (URGENT CARE ONLY)    EKG   Radiology No results found.  Procedures Procedures (including critical care time)  Medications Ordered in UC Medications - No data to display  Initial Impression / Assessment and Plan / UC Course  I have reviewed the triage vital signs and the nursing notes.  Pertinent labs & imaging results that were available during my care of the patient were reviewed by me and considered in my medical decision making (see chart for details).     Flu testing and rapid strep were negative in clinic today.  Send  out for respiratory panel and COVID-19 testing given patient has immunodeficiency.  She does report household sick contacts with influenza so will empirically treat with Tamiflu 75 mg twice daily.  Discussed likely viral etiology.  No evidence of acute infection that warrant initiation of antibiotics today.  Recommended she use over-the-counter medications including Tylenol and ibuprofen for fever and pain.  Discussed alarm symptoms that warrant going to the emergency room.  Strict return precautions given to which she expressed understanding.  Final Clinical Impressions(s) / UC Diagnoses   Final diagnoses:  Upper respiratory tract infection, unspecified type  Sore throat  Fever, unspecified     Discharge Instructions      I believe that you have a virus.  You tested negative for flu and strep today but I am concerned that it is just to early to test and you actually do have the flu.  We sent off a respiratory panel as well as a COVID test we will contact you if this is positive.  We are going to start Tamiflu twice daily to cover for the flu.  Alternate Tylenol and ibuprofen for fever and pain.  Make sure you are drinking plenty of fluid.  I do recommend that you follow-up with your infectious disease provider particularly if symptoms or not improving quickly.  If you have any sudden severe symptoms including high fever not responding to medication, nausea/vomiting interfering with oral intake, chest discomfort, shortness of breath you need to go to the emergency room as we discussed.     ED Prescriptions     Medication Sig Dispense Auth. Provider   oseltamivir (TAMIFLU) 75 MG capsule Take 1 capsule (75 mg total) by mouth every 12 (twelve) hours. 10 capsule Meryn Sarracino, Derry Skill, PA-C      PDMP not reviewed this encounter.   Terrilee Croak, PA-C 11/13/21 1427

## 2021-11-13 NOTE — Discharge Instructions (Addendum)
I believe that you have a virus.  You tested negative for flu and strep today but I am concerned that it is just to early to test and you actually do have the flu.  We sent off a respiratory panel as well as a COVID test we will contact you if this is positive.  We are going to start Tamiflu twice daily to cover for the flu.  Alternate Tylenol and ibuprofen for fever and pain.  Make sure you are drinking plenty of fluid.  I do recommend that you follow-up with your infectious disease provider particularly if symptoms or not improving quickly.  If you have any sudden severe symptoms including high fever not responding to medication, nausea/vomiting interfering with oral intake, chest discomfort, shortness of breath you need to go to the emergency room as we discussed.

## 2021-11-14 LAB — SARS CORONAVIRUS 2 (TAT 6-24 HRS): SARS Coronavirus 2: NEGATIVE

## 2021-11-15 LAB — RESPIRATORY PANEL BY PCR

## 2021-12-02 ENCOUNTER — Ambulatory Visit: Payer: BC Managed Care – PPO | Admitting: Infectious Disease

## 2021-12-06 ENCOUNTER — Ambulatory Visit: Payer: BC Managed Care – PPO | Admitting: Internal Medicine

## 2021-12-21 ENCOUNTER — Ambulatory Visit: Payer: BC Managed Care – PPO | Admitting: Internal Medicine

## 2021-12-23 ENCOUNTER — Ambulatory Visit: Payer: BC Managed Care – PPO | Admitting: Internal Medicine

## 2021-12-31 ENCOUNTER — Emergency Department (HOSPITAL_COMMUNITY): Payer: BC Managed Care – PPO

## 2021-12-31 ENCOUNTER — Emergency Department (HOSPITAL_COMMUNITY)
Admission: EM | Admit: 2021-12-31 | Discharge: 2021-12-31 | Disposition: A | Discharge status: Home / Self Care | Payer: BC Managed Care – PPO | Attending: Emergency Medicine | Admitting: Emergency Medicine

## 2021-12-31 ENCOUNTER — Other Ambulatory Visit: Payer: Self-pay

## 2021-12-31 ENCOUNTER — Encounter (HOSPITAL_COMMUNITY): Payer: Self-pay

## 2021-12-31 DIAGNOSIS — Z20822 Contact with and (suspected) exposure to covid-19: Secondary | ICD-10-CM | POA: Diagnosis not present

## 2021-12-31 DIAGNOSIS — R1084 Generalized abdominal pain: Secondary | ICD-10-CM | POA: Insufficient documentation

## 2021-12-31 DIAGNOSIS — R1032 Left lower quadrant pain: Secondary | ICD-10-CM | POA: Diagnosis not present

## 2021-12-31 DIAGNOSIS — Z8542 Personal history of malignant neoplasm of other parts of uterus: Secondary | ICD-10-CM | POA: Diagnosis not present

## 2021-12-31 DIAGNOSIS — R112 Nausea with vomiting, unspecified: Secondary | ICD-10-CM | POA: Diagnosis not present

## 2021-12-31 DIAGNOSIS — R079 Chest pain, unspecified: Secondary | ICD-10-CM | POA: Insufficient documentation

## 2021-12-31 DIAGNOSIS — R197 Diarrhea, unspecified: Secondary | ICD-10-CM | POA: Diagnosis not present

## 2021-12-31 DIAGNOSIS — R300 Dysuria: Secondary | ICD-10-CM | POA: Diagnosis not present

## 2021-12-31 DIAGNOSIS — Z8616 Personal history of COVID-19: Secondary | ICD-10-CM | POA: Diagnosis not present

## 2021-12-31 LAB — CBC WITH DIFFERENTIAL/PLATELET
Abs Immature Granulocytes: 0.02 10*3/uL (ref 0.00–0.07)
Basophils Absolute: 0.1 10*3/uL (ref 0.0–0.1)
Basophils Relative: 1 %
Eosinophils Absolute: 0.1 10*3/uL (ref 0.0–0.5)
Eosinophils Relative: 2 %
HCT: 41 % (ref 36.0–46.0)
Hemoglobin: 13.6 g/dL (ref 12.0–15.0)
Immature Granulocytes: 0 %
Lymphocytes Relative: 30 %
Lymphs Abs: 2.1 10*3/uL (ref 0.7–4.0)
MCH: 31.2 pg (ref 26.0–34.0)
MCHC: 33.2 g/dL (ref 30.0–36.0)
MCV: 94 fL (ref 80.0–100.0)
Monocytes Absolute: 0.5 10*3/uL (ref 0.1–1.0)
Monocytes Relative: 7 %
Neutro Abs: 4.2 10*3/uL (ref 1.7–7.7)
Neutrophils Relative %: 60 %
Platelets: 280 10*3/uL (ref 150–400)
RBC: 4.36 MIL/uL (ref 3.87–5.11)
RDW: 13 % (ref 11.5–15.5)
WBC: 7 10*3/uL (ref 4.0–10.5)
nRBC: 0 % (ref 0.0–0.2)

## 2021-12-31 LAB — COMPREHENSIVE METABOLIC PANEL
ALT: 12 U/L (ref 0–44)
AST: 23 U/L (ref 15–41)
Albumin: 3.8 g/dL (ref 3.5–5.0)
Alkaline Phosphatase: 87 U/L (ref 38–126)
Anion gap: 8 (ref 5–15)
BUN: 5 mg/dL — ABNORMAL LOW (ref 6–20)
CO2: 25 mmol/L (ref 22–32)
Calcium: 9.4 mg/dL (ref 8.9–10.3)
Chloride: 107 mmol/L (ref 98–111)
Creatinine, Ser: 0.81 mg/dL (ref 0.44–1.00)
GFR, Estimated: >60 mL/min (ref >60)
Glucose, Bld: 108 mg/dL — ABNORMAL HIGH (ref 70–99)
Potassium: 3.8 mmol/L (ref 3.5–5.1)
Sodium: 140 mmol/L (ref 135–145)
Total Bilirubin: 0.7 mg/dL (ref 0.3–1.2)
Total Protein: 6.7 g/dL (ref 6.5–8.1)

## 2021-12-31 LAB — URINALYSIS, ROUTINE W REFLEX MICROSCOPIC
Bilirubin Urine: NEGATIVE
Glucose, UA: 100 mg/dL — AB
Hgb urine dipstick: NEGATIVE
Ketones, ur: NEGATIVE mg/dL
Leukocytes,Ua: NEGATIVE
Nitrite: POSITIVE — AB
Protein, ur: NEGATIVE mg/dL
Specific Gravity, Urine: <1.005 — ABNORMAL LOW (ref 1.005–1.030)
pH: 5.5 (ref 5.0–8.0)

## 2021-12-31 LAB — POC OCCULT BLOOD, ED: Fecal Occult Bld: NEGATIVE

## 2021-12-31 LAB — I-STAT BETA HCG BLOOD, ED (MC, WL, AP ONLY): I-stat hCG, quantitative: 8.4 m[IU]/mL — ABNORMAL HIGH (ref <5)

## 2021-12-31 LAB — TROPONIN I (HIGH SENSITIVITY)
Troponin I (High Sensitivity): 8 ng/L (ref <18)
Troponin I (High Sensitivity): 5 ng/L (ref <18)

## 2021-12-31 LAB — URINALYSIS, MICROSCOPIC (REFLEX): Squamous Epithelial / HPF: NONE SEEN (ref 0–5)

## 2021-12-31 LAB — RESP PANEL BY RT-PCR (FLU A&B, COVID) ARPGX2
Influenza A by PCR: NEGATIVE
Influenza B by PCR: NEGATIVE
SARS Coronavirus 2 by RT PCR: NEGATIVE

## 2021-12-31 LAB — LIPASE, BLOOD: Lipase: 29 U/L (ref 11–51)

## 2021-12-31 MED ORDER — MORPHINE SULFATE (PF) 4 MG/ML IV SOLN
4.0000 mg | Freq: Once | INTRAVENOUS | Status: AC
  Administered 2021-12-31: 4 mg via INTRAVENOUS
  Filled 2021-12-31: qty 1

## 2021-12-31 MED ORDER — SODIUM CHLORIDE 0.9 % IV BOLUS
1000.0000 mL | Freq: Once | INTRAVENOUS | Status: AC
  Administered 2021-12-31: 1000 mL via INTRAVENOUS

## 2021-12-31 MED ORDER — ONDANSETRON 4 MG PO TBDP: 4.0000 mg | ORAL_TABLET | Freq: Three times a day (TID) | ORAL | 0 refills | Status: DC | PRN

## 2021-12-31 MED ORDER — ONDANSETRON 4 MG PO TBDP: 4.0000 mg | ORAL_TABLET | Freq: Once | ORAL | Status: DC

## 2021-12-31 MED ORDER — IOHEXOL 300 MG/ML  SOLN
100.0000 mL | Freq: Once | INTRAMUSCULAR | Status: AC | PRN
  Administered 2021-12-31: 100 mL via INTRAVENOUS

## 2021-12-31 MED ORDER — ONDANSETRON HCL 4 MG/2ML IJ SOLN
4.0000 mg | Freq: Once | INTRAMUSCULAR | Status: AC
  Administered 2021-12-31: 4 mg via INTRAVENOUS
  Filled 2021-12-31: qty 2

## 2021-12-31 MED ORDER — METOCLOPRAMIDE HCL 5 MG/ML IJ SOLN
10.0000 mg | Freq: Once | INTRAMUSCULAR | Status: AC
  Administered 2021-12-31: 10 mg via INTRAVENOUS
  Filled 2021-12-31: qty 2

## 2021-12-31 MED ORDER — PANTOPRAZOLE SODIUM 40 MG IV SOLR
40.0000 mg | Freq: Once | INTRAVENOUS | Status: AC
Start: 2021-12-31 — End: 2021-12-31
  Administered 2021-12-31: 40 mg via INTRAVENOUS
  Filled 2021-12-31: qty 40

## 2021-12-31 NOTE — ED Provider Triage Note (Signed)
Emergency Medicine Provider Triage Evaluation Note  [name] Horn , a 46 y.o. female  was evaluated in triage.  Pt complains of abdominal pain and chest pain.  Patient was that abdominal pain is located to left lower quadrant and radiates to her back.  Pain has been present since the weekend and progressively worsening.  Pain originally was only present when having bowel movements however pain has become constant.  Patient states that she had dark red stool over the weekend however that has resolved.  Patient states that she has nausea and vomiting.  Denies any hematemesis or coffee-ground emesis.  Patient reports that she has had intermittent fevers.  Patient reports that starting at midnight she has had "twinges," of pain to the left side of her chest.  States that these episodes have been lasting for a few seconds then resolved.  Describes it as a bee sting.  Review of Systems  Positive: Abdominal pain, nausea, vomiting, blood in stool, chest pain Negative: Chills, hematemesis, coffee-ground emesis, melena, constipation, diarrhea, dysuria, hematuria, urinary urgency, shortness of breath  Physical Exam  BP (!) 124/100 (BP Location: Right Arm)    Pulse 89    Temp 97.9 F (36.6 C) (Oral)    Resp 16    SpO2 100%  Gen:   Awake, no distress   Resp:  Normal effort  MSK:   Moves extremities without difficulty  Other:  Abdomen protuberant, soft, tenderness to left lower quadrant.  No guarding or rebound tenderness.  Medical Decision Making  Medically screening exam initiated at 4:18 AM.  Appropriate orders placed.  [name] Horn was informed that the remainder of the evaluation will be completed by another provider, this initial triage assessment does not replace that evaluation, and the importance of remaining in the ED until their evaluation is complete.     Loni Beckwith, [place] 12/31/21 0421

## 2021-12-31 NOTE — ED Provider Notes (Signed)
[facility] DEPARTMENT Provider Note   CSN: [id number] Arrival date & time: 12/31/21  1607     History  Chief Complaint  Patient presents with   Nausea   Emesis   Dizziness    [name] Horn is a 46 y.o. female who presents to the ED due to abdominal pain associate with nausea, vomiting, and diarrhea x1 week.  Pain located in LLQ. Patient states pain radiates into her tailbone and is worse with bowel movements.  She endorses some dark red stool over the weekend which resolved. No hematemesis or melena. Admits to a fever last week.  T-max 103 F.  She recently had COVID-19 in July 2022.  Denies sick contacts or COVID exposures. She endorses some dysuria and took AZO with relief in symptoms. No vaginal symptoms. Patient also admits to some intermittent, central, nonradiating chest pain that started around midnight.  Denies associated shortness of breath.  No lower extremity edema.   History obtained from patient and past medical records. No interpreter used during encounter.       Home Medications Prior to Admission medications   Medication Sig Start Date End Date Taking? Authorizing Provider  amphetamine-dextroamphetamine (ADDERALL) 20 MG tablet Take 20 mg by mouth 3 (three) times daily.   Yes [provider]  atropine 1 % ophthalmic solution Place 1 drop into the left eye daily as needed (for dry eye).   Yes [provider]  baclofen (LIORESAL) 10 MG tablet Take 10 mg by mouth at bedtime. 02/28/20  Yes [provider]  Bepotastine Besilate 1.5 % SOLN Place 1 drop into both eyes 2 (two) times a day.   Yes [provider]  cycloSPORINE (RESTASIS) 0.05 % ophthalmic emulsion Place 1 drop into both eyes 2 (two) times daily.   Yes [provider]  dicyclomine (BENTYL) 10 MG capsule Take 10 mg by mouth daily as needed for nausea/vomiting. 01/02/19  Yes [provider]  famotidine (PEPCID) 40 MG tablet Take 40 mg  by mouth at bedtime. 05/02/21  Yes [provider]  folic acid (FOLVITE) 1 MG tablet Take 2 tablets (2 mg total) by mouth daily. 10/30/19  Yes Deveshwar, Abel Presto, MD  furosemide (LASIX) 20 MG tablet Take 20 mg by mouth daily as needed for edema or fluid.   Yes [provider]  LORazepam (ATIVAN) 1 MG tablet Take 1 mg by mouth 3 (three) times daily as needed for anxiety. 03/08/20  Yes [provider]  magic mouthwash (nystatin, lidocaine, diphenhydrAMINE, alum & mag hydroxide) suspension Swish and spit 5 mLs 3 (three) times daily as needed for mouth pain. 06/10/21  Yes Tommy Medal, Lavell Islam, MD  metoCLOPramide (REGLAN) 10 MG tablet Take 10 mg by mouth daily.    Yes [provider]  metoprolol succinate (TOPROL-XL) 25 MG 24 hr tablet Take 25 mg by mouth daily.   Yes [provider]  nystatin powder Apply 1 application topically 2 (two) times daily as needed (rash).   Yes [provider]  ondansetron (ZOFRAN-ODT) 4 MG disintegrating tablet Take 1 tablet (4 mg total) by mouth every 8 (eight) hours as needed for nausea or vomiting. 08/05/19  Yes Upstill, Shari, PA-C  ondansetron (ZOFRAN-ODT) 4 MG disintegrating tablet Take 1 tablet (4 mg total) by mouth every 8 (eight) hours as needed for nausea or vomiting. 12/31/21  Yes Ludie Pavlik, Druscilla Brownie, PA-C  oxycodone-acetaminophen (LYNOX) 10-300 MG tablet Take 1 tablet by mouth 3 (three) times daily.  Yes [provider]  oxyCODONE-acetaminophen (PERCOCET/ROXICET) 5-325 MG tablet Take 1 tablet by mouth every 6 (six) hours as needed for moderate pain. 12/28/21  Yes [provider]  pantoprazole (PROTONIX) 40 MG tablet Take 1 tablet (40 mg total) by mouth 2 (two) times daily. 05/02/17 08/04/28 Yes Elgergawy, Silver Huguenin, MD  Polyethyl Glycol-Propyl Glycol (SYSTANE) 0.4-0.3 % GEL ophthalmic gel Place 1 application into both eyes in the morning and at bedtime.   Yes [provider]  potassium chloride  (K-DUR) 10 MEQ tablet Take 10 mEq by mouth daily as needed (when taking fursoemide).   Yes [provider]  pravastatin (PRAVACHOL) 40 MG tablet Take 40 mg by mouth at bedtime.   Yes [provider]  prednisoLONE acetate (PRED FORTE) 1 % ophthalmic suspension Place 1 drop into the left eye as needed (conjunctivitis).   Yes [provider]  promethazine (PHENERGAN) 25 MG suppository Place 1 suppository (25 mg total) rectally every 6 (six) hours as needed for nausea or vomiting. 08/05/19  Yes Upstill, Shari, PA-C  SUMAtriptan (IMITREX) 25 MG tablet Take 25 mg by mouth every 2 (two) hours as needed for migraine. May repeat in 2 hours if headache persists or recurs.   Yes [provider]  topiramate (TOPAMAX) 100 MG tablet Take 100 mg by mouth daily. 06/18/19  Yes [provider]  topiramate (TOPAMAX) 25 MG tablet Take 25 mg by mouth at bedtime.    Yes [provider]  traZODone (DESYREL) 150 MG tablet Take 150 mg by mouth at bedtime as needed for sleep.   Yes [provider]  triamcinolone lotion (KENALOG) 0.1 % Apply 1 application topically daily as needed (rash). 07/07/20  Yes [provider]  valACYclovir (VALTREX) 1000 MG tablet Take 1 tablet (1,000 mg total) by mouth daily. Patient taking differently: Take 1,000 mg by mouth See admin instructions. 3-4 times per day 08/12/20  Yes Tommy Medal, Lavell Islam, MD  benzonatate (TESSALON) 100 MG capsule Take 1 capsule (100 mg total) by mouth every 8 (eight) hours as needed for cough. Patient not taking: Reported on 09/14/2021 06/22/21   Barrie Folk, PA-C  cyclobenzaprine (FLEXERIL) 10 MG tablet Take 1 tablet by mouth 3 (three) times daily as needed. Patient not taking: Reported on 09/14/2021 05/05/21   [provider]  meloxicam (MOBIC) 15 MG tablet Take 1 tablet (15 mg total) by mouth daily. Patient not taking: Reported on 09/14/2021 07/25/21   Orpah Greek, MD   nystatin (MYCOSTATIN) 100000 UNIT/ML suspension Take 10 mLs (1,000,000 Units total) by mouth 3 (three) times daily. Patient not taking: Reported on 09/14/2021 12/07/20   Tommy Medal, Lavell Islam, MD  oseltamivir (TAMIFLU) 75 MG capsule Take 1 capsule (75 mg total) by mouth every 12 (twelve) hours. Patient not taking: Reported on 12/31/2021 11/13/21   Raspet, Derry Skill, PA-C      Allergies    Other, Sulfa antibiotics, Sulfonamide derivatives, Benadryl [diphenhydramine hcl], and Silicone    Review of Systems   Review of Systems  Constitutional:  Positive for fever. Negative for chills.  Respiratory:  Negative for shortness of breath.   Cardiovascular:  Positive for chest pain. Negative for leg swelling.  Gastrointestinal:  Positive for abdominal pain, diarrhea, nausea and vomiting.  Genitourinary:  Positive for dysuria. Negative for vaginal discharge.  All other systems reviewed and are negative.  Physical Exam Updated Vital Signs BP 134/81 (BP Location: Right Arm)    Pulse 63    Temp  98.6 F (37 C) (Oral)    Resp 20    SpO2 100%  Physical Exam Vitals and nursing note reviewed.  Constitutional:      General: She is not in acute distress.    Appearance: She is not ill-appearing.  HENT:     Head: Normocephalic.  Eyes:     Pupils: Pupils are equal, round, and reactive to light.  Cardiovascular:     Rate and Rhythm: Normal rate and regular rhythm.     Pulses: Normal pulses.     Heart sounds: Normal heart sounds. No murmur heard.   No friction rub. No gallop.  Pulmonary:     Effort: Pulmonary effort is normal.     Breath sounds: Normal breath sounds.  Abdominal:     General: Abdomen is flat. There is no distension.     Palpations: Abdomen is soft.     Tenderness: There is abdominal tenderness. There is no guarding or rebound.     Comments: Diffuse tenderness without rebound or guarding  Musculoskeletal:        General: Normal range of motion.     Cervical back: Neck supple.  Skin:     General: Skin is warm and dry.  Neurological:     General: No focal deficit present.     Mental Status: She is alert.  Psychiatric:        Mood and Affect: Mood normal.        Behavior: Behavior normal.    ED Results / Procedures / Treatments   Labs (all labs ordered are listed, but only abnormal results are displayed) Labs Reviewed  COMPREHENSIVE METABOLIC PANEL - Abnormal; Notable for the following components:      Result Value   Glucose, Bld 108 (*)    BUN 5 (*)    All other components within normal limits  URINALYSIS, ROUTINE W REFLEX MICROSCOPIC - Abnormal; Notable for the following components:   Color, Urine ORANGE (*)    Specific Gravity, Urine <1.005 (*)    Glucose, UA 100 (*)    Nitrite POSITIVE (*)    All other components within normal limits  URINALYSIS, MICROSCOPIC (REFLEX) - Abnormal; Notable for the following components:   Bacteria, UA RARE (*)    All other components within normal limits  I-STAT BETA HCG BLOOD, ED ([facility], [facility], [facility] ONLY) - Abnormal; Notable for the following components:   I-stat hCG, quantitative 8.4 (*)    All other components within normal limits  RESP PANEL BY RT-PCR (FLU A&B, COVID) ARPGX2  URINE CULTURE  CBC WITH DIFFERENTIAL/PLATELET  LIPASE, BLOOD  POC OCCULT BLOOD, ED  TROPONIN I (HIGH SENSITIVITY)  TROPONIN I (HIGH SENSITIVITY)    EKG None  Radiology DG Chest 2 View  Result Date: 12/31/2021 CLINICAL DATA:  Chest pain. EXAM: CHEST - 2 VIEW COMPARISON:  07/24/2021 FINDINGS: The lungs are clear without focal pneumonia, edema, pneumothorax or pleural effusion. The cardiopericardial silhouette is within normal limits for size. The visualized bony structures of the thorax show no acute abnormality. IMPRESSION: No active cardiopulmonary disease. Electronically Signed   By: Misty Stanley M.D.   On: 12/31/2021 06:08   CT ABDOMEN PELVIS W CONTRAST  Result Date: 12/31/2021 CLINICAL DATA:  Left lower quadrant pain EXAM: CT ABDOMEN AND PELVIS WITH  CONTRAST TECHNIQUE: Multidetector CT imaging of the abdomen and pelvis was performed using the standard protocol following bolus administration of intravenous contrast. CONTRAST:  182mL OMNIPAQUE IOHEXOL 300 MG/ML  SOLN COMPARISON:  CT abdomen  and pelvis 08/13/2019 FINDINGS: Lower chest: No acute abnormality. Hepatobiliary: Liver is normal in size and contour. Hypodense appearance of the parenchyma with no focal mass identified. Gallbladder is surgically absent. No biliary ductal dilatation. Pancreas: Unremarkable. No pancreatic ductal dilatation or surrounding inflammatory changes. Spleen: Normal in size without focal abnormality. Adrenals/Urinary Tract: Adrenal glands are unremarkable. Kidneys are normal, without renal calculi, focal lesion, or hydronephrosis. Bladder is unremarkable. Stomach/Bowel: Small hiatal hernia. No bowel obstruction, free air or pneumatosis. Segment of colonic wall thickening and edema with mild adjacent fat stranding identified from the proximal to mid sigmoid colon. Appendix surgically absent. Vascular/Lymphatic: No significant vascular findings are present. No enlarged abdominal or pelvic lymph nodes. Reproductive: Status post hysterectomy. No adnexal masses. Other: No abdominal wall hernia or abnormality. No abdominopelvic ascites. Musculoskeletal: No acute or significant osseous findings. IMPRESSION: 1. No evidence of acute colitis involving the sigmoid colon. 2. Small hiatal hernia. 3. Hepatic steatosis. Electronically Signed   By: Ofilia Neas M.D.   On: 12/31/2021 08:41    Procedures Procedures    Medications Ordered in ED Medications  iohexol (OMNIPAQUE) 300 MG/ML solution 100 mL (100 mLs Intravenous Contrast Given 12/31/21 0801)  sodium chloride 0.9 % bolus 1,000 mL (0 mLs Intravenous Stopped 12/31/21 1739)  ondansetron (ZOFRAN) injection 4 mg (4 mg Intravenous Given 12/31/21 1622)  morphine 4 MG/ML injection 4 mg (4 mg Intravenous Given 12/31/21 1627)  pantoprazole  (PROTONIX) injection 40 mg (40 mg Intravenous Given 12/31/21 1625)  metoCLOPramide (REGLAN) injection 10 mg (10 mg Intravenous Given 12/31/21 1835)    ED Course/ Medical Decision Making/ A&P Clinical Course as of 12/31/21 1900  Fri Dec 31, 2021  1610 Nitrite(!): POSITIVE [CA]  1610 Bacteria, UA(!): RARE [CA]  1611 Elevated Hcg; however patient has a history of hysterectomy. No concern for pregnancy at this time. [CA]  1851 Fecal Occult Blood, POC: NEGATIVE Rectal exam performed with chaperone in room.  Fecal occult negative.  Low suspicion for GI bleed. [CA]    Clinical Course User Index [CA] Suzy Bouchard, PA-C                           Medical Decision Making 46 year old female presents to the ED due to abdominal pain associate with nausea, vomiting, diarrhea x1 week.  She also admits to some left-sided chest pain.  Unfortunately, patient waited over 12 hours prior to initial evaluation due to long wait times.  Upon arrival, stable vitals.  Patient is afebrile, not tachycardic or hypoxic.  Patient in no acute distress.  Physical exam significant for tenderness throughout abdomen without rebound or guarding.  Patient also endorses some dysuria.  No vaginal symptoms.  CT abdomen and labs ordered at triage.  IV fluids, Zofran, morphine, Protonix given.  Added COVID test.   This patient presents to the ED for concern of abdominal pain and chest pain this involves an extensive number of treatment options, and is a complaint that carries with it a high risk of complications and morbidity.  The differential diagnosis includes acute abdomen, infectious etiology, ACS, PE, dissection   Co morbidities that complicate the patient evaluation  Hx of uterine cancer, RA on immunosuppressants    Additional history obtained:  Additional history obtained from external medical records External records from outside source obtained and reviewed including ID office visit, UC visit, cardiology  visit   Lab Tests:  I Ordered, and personally interpreted labs.  The pertinent results include:  CBC unremarkable.  No leukocytosis and normal hemoglobin.  Lipase normal.  Delta troponin flat.  hCG mildly elevated however patient had a hysterectomy so no concern for pregnancy.  CMP with mild hyperglycemia 108.  No signs of DKA.  Normal renal function.  Electrolyte derangements.  UA significant for orange urine and positive nitrates.  No leukocytes.  Suspect positive nitrates and orange coloration likely due to AZO.  Added urine culture.   Imaging Studies ordered:  I ordered imaging studies including CT abdomen, CXR I independently visualized and interpreted imaging which showed no evidence of acute abdomen, small hiatal hernia. CXR negative for signs of pneumonia, pneumothorax, or widened mediastinum. I agree with the radiologist interpretation   Cardiac Monitoring:  The patient was maintained on a cardiac monitor.  I personally viewed and interpreted the cardiac monitored which showed an underlying rhythm of normal sinus rhythm.  No signs of acute ischemia.   Medicines ordered and prescription drug management:  I ordered medication including morphine, zofran, protonix, IVFs for symptomatic relief Reevaluation of the patient after these medicines showed that the patient improved I have reviewed the patients home medicines and have made adjustments as needed   Test Considered:  Pelvic US; however low suspicion for pelvic etiology given located of pain and associated symptoms   Critical Interventions:  Nausea medication given   Problem List / ED Course:  46 year old presents to the ED due to nausea vomiting, and diarrhea associated with generalized abdominal pain.  CT abdomen negative for signs of infection or acute abdomen.  Patient given nausea medication with improvement in symptoms.  Patient able to tolerate p.o. without difficulty.  Suspect symptoms related to possible  gastroenteritis.  COVID/influenza negative.  Patient also endorses some intermittent red stool however, fecal occult negative.  Low suspicion for GI bleed.  UA positive for nitrates however, patient took Azo prior to arrival which can cause false positives.  Urine culture pending.  Patient prefers to wait for urine culture results prior to starting antibiotics which I find it reasonable given antibiotics will worsen her diarrhea.  Patient also endorses some intermittent chest pain.  Delta troponin flat.  Chest x-ray negative for signs of pneumonia, pneumothorax, or widened mediastinum.  EKG demonstrates normal sinus rhythm.  Low suspicion for ACS.  No evidence of DVT on exam.  Low suspicion for DVT/PE.  Presentation nonconcerning for dissection. Will discharge patient with zofran as needed for nausea.    Reevaluation:  After the interventions noted above, I reevaluated the patient and found that they have :improved   Dispostion:  After consideration of the diagnostic results and the patients response to treatment, I feel that the patent would benefit from discharge home with nausea medication. Advised patient to follow-up with PCP within 1 week. Strict ED precautions discussed with patient. Patient states understanding and agrees to plan. Patient discharged home in no acute distress and stable vitals        Final Clinical Impression(s) / ED Diagnoses Final diagnoses:  Generalized abdominal pain  Nausea vomiting and diarrhea    Rx / DC Orders ED Discharge Orders          Ordered    ondansetron (ZOFRAN-ODT) 4 MG disintegrating tablet  Every 8 hours PRN        12/31/21 1858              Karie Kirks 12/31/21 1901    Pattricia Boss, MD 01/01/22 [phone]

## 2021-12-31 NOTE — Discharge Instructions (Addendum)
It was pleasure taking care of you today.  As discussed, your CT abdomen did not show any acute abnormalities.  I suspect you have a viral infection causing nausea, vomiting, and diarrhea.  I am sending you home with nausea medication.  Take as needed.  Please follow-up with PCP within the next few days for further evaluation.  Your cardiac labs were normal.  Chest x-ray did not show signs of infection.  Return to the ER for any worsening symptoms.

## 2021-12-31 NOTE — ED Triage Notes (Addendum)
Patient from home c/o abdominal pain that radiates to tailbone, worse when moving bowels. Patient states last week patient began taking anti-fungals, patient also reports at that time she began vomiting and dizziness. Patient also reports fevers. Patient additionally reports CP beginning at midnight. Patient reports history of perforated bowel.

## 2022-02-10 ENCOUNTER — Emergency Department
Admission: EM | Admit: 2022-02-10 | Discharge: 2022-02-10 | Disposition: A | Discharge status: Home / Self Care | Payer: BC Managed Care – PPO | Attending: Emergency Medicine | Admitting: Emergency Medicine

## 2022-02-10 ENCOUNTER — Other Ambulatory Visit: Payer: Self-pay

## 2022-02-10 DIAGNOSIS — B9689 Other specified bacterial agents as the cause of diseases classified elsewhere: Secondary | ICD-10-CM | POA: Diagnosis not present

## 2022-02-10 DIAGNOSIS — E878 Other disorders of electrolyte and fluid balance, not elsewhere classified: Secondary | ICD-10-CM | POA: Insufficient documentation

## 2022-02-10 DIAGNOSIS — R197 Diarrhea, unspecified: Secondary | ICD-10-CM | POA: Insufficient documentation

## 2022-02-10 DIAGNOSIS — N390 Urinary tract infection, site not specified: Secondary | ICD-10-CM | POA: Diagnosis not present

## 2022-02-10 DIAGNOSIS — Z20822 Contact with and (suspected) exposure to covid-19: Secondary | ICD-10-CM | POA: Insufficient documentation

## 2022-02-10 DIAGNOSIS — E876 Hypokalemia: Secondary | ICD-10-CM | POA: Insufficient documentation

## 2022-02-10 DIAGNOSIS — R1084 Generalized abdominal pain: Secondary | ICD-10-CM | POA: Diagnosis present

## 2022-02-10 DIAGNOSIS — R112 Nausea with vomiting, unspecified: Secondary | ICD-10-CM | POA: Insufficient documentation

## 2022-02-10 LAB — COMPREHENSIVE METABOLIC PANEL
ALT: 10 U/L (ref 0–44)
AST: 26 U/L (ref 15–41)
Albumin: 4 g/dL (ref 3.5–5.0)
Alkaline Phosphatase: 68 U/L (ref 38–126)
Anion gap: 6 (ref 5–15)
BUN: 8 mg/dL (ref 6–20)
CO2: 22 mmol/L (ref 22–32)
Calcium: 9.5 mg/dL (ref 8.9–10.3)
Chloride: 115 mmol/L — ABNORMAL HIGH (ref 98–111)
Creatinine, Ser: 0.78 mg/dL (ref 0.44–1.00)
GFR, Estimated: >60 mL/min (ref >60)
Glucose, Bld: 120 mg/dL — ABNORMAL HIGH (ref 70–99)
Potassium: 3.4 mmol/L — ABNORMAL LOW (ref 3.5–5.1)
Sodium: 143 mmol/L (ref 135–145)
Total Bilirubin: 0.5 mg/dL (ref 0.3–1.2)
Total Protein: 6.9 g/dL (ref 6.5–8.1)

## 2022-02-10 LAB — CBC
HCT: 38.2 % (ref 36.0–46.0)
Hemoglobin: 12.9 g/dL (ref 12.0–15.0)
MCH: 31.5 pg (ref 26.0–34.0)
MCHC: 33.8 g/dL (ref 30.0–36.0)
MCV: 93.4 fL (ref 80.0–100.0)
Platelets: 283 10*3/uL (ref 150–400)
RBC: 4.09 MIL/uL (ref 3.87–5.11)
RDW: 13.1 % (ref 11.5–15.5)
WBC: 8.8 10*3/uL (ref 4.0–10.5)
nRBC: 0 % (ref 0.0–0.2)

## 2022-02-10 LAB — URINALYSIS, ROUTINE W REFLEX MICROSCOPIC
Bacteria, UA: NONE SEEN
Bilirubin Urine: NEGATIVE
Glucose, UA: NEGATIVE mg/dL
Hgb urine dipstick: NEGATIVE
Ketones, ur: NEGATIVE mg/dL
Leukocytes,Ua: NEGATIVE
Nitrite: POSITIVE — AB
Protein, ur: 30 mg/dL — AB
Specific Gravity, Urine: 1.018 (ref 1.005–1.030)
pH: 6 (ref 5.0–8.0)

## 2022-02-10 LAB — RESP PANEL BY RT-PCR (FLU A&B, COVID) ARPGX2
Influenza A by PCR: NEGATIVE
Influenza B by PCR: NEGATIVE
SARS Coronavirus 2 by RT PCR: NEGATIVE

## 2022-02-10 LAB — LIPASE, BLOOD: Lipase: 30 U/L (ref 11–51)

## 2022-02-10 MED ORDER — ONDANSETRON HCL 4 MG/2ML IJ SOLN
4.0000 mg | Freq: Once | INTRAMUSCULAR | Status: AC
  Administered 2022-02-10: 4 mg via INTRAVENOUS
  Filled 2022-02-10: qty 2

## 2022-02-10 MED ORDER — PANTOPRAZOLE SODIUM 40 MG IV SOLR
40.0000 mg | Freq: Once | INTRAVENOUS | Status: AC
  Administered 2022-02-10: 40 mg via INTRAVENOUS
  Filled 2022-02-10: qty 10

## 2022-02-10 MED ORDER — MORPHINE SULFATE (PF) 4 MG/ML IV SOLN
4.0000 mg | Freq: Once | INTRAVENOUS | Status: AC
  Administered 2022-02-10: 4 mg via INTRAVENOUS
  Filled 2022-02-10: qty 1

## 2022-02-10 MED ORDER — PROMETHAZINE HCL 12.5 MG PO TABS: 12.5000 mg | ORAL_TABLET | Freq: Four times a day (QID) | ORAL | 0 refills | Status: DC | PRN

## 2022-02-10 MED ORDER — KETOROLAC TROMETHAMINE 30 MG/ML IJ SOLN
30.0000 mg | Freq: Once | INTRAMUSCULAR | Status: AC
  Administered 2022-02-10: 30 mg via INTRAVENOUS
  Filled 2022-02-10: qty 1

## 2022-02-10 MED ORDER — PROMETHAZINE HCL 25 MG/ML IJ SOLN
25.0000 mg | Freq: Once | INTRAMUSCULAR | Status: AC
  Administered 2022-02-10: 25 mg via INTRAVENOUS
  Filled 2022-02-10: qty 1

## 2022-02-10 MED ORDER — SODIUM CHLORIDE 0.9 % IV BOLUS
1000.0000 mL | Freq: Once | INTRAVENOUS | Status: AC
  Administered 2022-02-10: 1000 mL via INTRAVENOUS

## 2022-02-10 NOTE — ED Provider Notes (Signed)
[facility] Provider Note   Event Date/Time   First MD Initiated Contact with Patient 02/10/22 1711     (approximate) History  Emesis and Fever  HPI [name] Horn is a 46 y.o. female with a stated past medical history of current thrush, recurrent C. difficile, gastroparesis, and recurrent nausea/vomiting who presents for nausea/vomiting in the setting of recently diagnosed urinary tract infection.  Patient states that she only took 1 day of antibiotics prior to developing dehydration and nausea/vomiting starting last night.  Patient states that she has been unable to keep down anything p.o. since that time.  Patient states that she has had similar symptoms in the past when she has had urinary tract infections and feels that this nausea and vomiting "happens when my fluid levels get off".  Patient states that she has been unable to take her antibiotics or fluconazole for thrush over this time and feels that the oral thrush is worsening.  Patient endorses mild generalized abdominal pain as well as 1 episode of diarrhea. Physical Exam  Triage Vital Signs: ED Triage Vitals  Enc Vitals Group     BP 02/10/22 1515 (!) 127/94     Pulse Rate 02/10/22 1515 86     Resp 02/10/22 1515 18     Temp 02/10/22 1515 99.4 F (37.4 C)     Temp Source 02/10/22 1515 Oral     SpO2 02/10/22 1515 97 %     Weight 02/10/22 1515 186 lb (84.4 kg)     Height 02/10/22 1515 5\' 4"  (1.626 m)     Head Circumference --      Peak Flow --      Pain Score 02/10/22 1519 8     Pain Loc --      Pain Edu? --      Excl. in [place]? --    Most recent vital signs: Vitals:   02/10/22 2200 02/10/22 2230  BP: 122/74 121/71  Pulse: 62 66  Resp: 17   Temp:    SpO2: 98% 98%   General: Awake, oriented x4. CV:  Good peripheral perfusion.  Resp:  Normal effort.  Abd:  No distention.  Generalized tenderness to palpation Other:  Middle-aged overweight Caucasian female laying in bed in no distress ED Results  / Procedures / Treatments  Labs (all labs ordered are listed, but only abnormal results are displayed) Labs Reviewed  COMPREHENSIVE METABOLIC PANEL - Abnormal; Notable for the following components:      Result Value   Potassium 3.4 (*)    Chloride 115 (*)    Glucose, Bld 120 (*)    All other components within normal limits  URINALYSIS, ROUTINE W REFLEX MICROSCOPIC - Abnormal; Notable for the following components:   Color, Urine AMBER (*)    APPearance CLEAR (*)    Protein, ur 30 (*)    Nitrite POSITIVE (*)    All other components within normal limits  RESP PANEL BY RT-PCR (FLU A&B, COVID) ARPGX2  LIPASE, BLOOD  CBC   PROCEDURES: Critical Care performed: No .1-3 Lead EKG Interpretation Performed by: Naaman Plummer, MD Authorized by: Naaman Plummer, MD     Interpretation: normal     ECG rate:  67   ECG rate assessment: normal     Rhythm: sinus rhythm     Ectopy: none     Conduction: normal   MEDICATIONS ORDERED IN ED: Medications  pantoprazole (PROTONIX) injection 40 mg (40 mg Intravenous Given 02/10/22 1940)  ondansetron (ZOFRAN) injection 4 mg (4 mg Intravenous Given 02/10/22 1941)  ketorolac (TORADOL) 30 MG/ML injection 30 mg (30 mg Intravenous Given 02/10/22 1942)  sodium chloride 0.9 % bolus 1,000 mL (0 mLs Intravenous Stopped 02/10/22 2217)  morphine (PF) 4 MG/ML injection 4 mg (4 mg Intravenous Given 02/10/22 2028)  promethazine (PHENERGAN) 25 mg in sodium chloride 0.9 % 50 mL IVPB (0 mg Intravenous Stopped 02/10/22 2244)   IMPRESSION / MDM / ASSESSMENT AND PLAN / ED COURSE  I reviewed the triage vital signs and the nursing notes.                             Differential diagnosis includes, but is not limited to, small bowel obstruction, sepsis, mesenteric ischemia, gastroenteritis, cyclic vomiting syndrome, gastroparesis  The patient is on the cardiac monitor to evaluate for evidence of arrhythmia and/or significant heart rate changes.  Patient presents for acute  nausea/vomiting The cause of the patients symptoms is not clear, but the patient is overall well appearing and is suspected to have a transient course of illness.  Given History and Exam there does not appear to be an emergent cause of the symptoms such as small bowel obstruction, coronary syndrome, bowel ischemia, DKA, pancreatitis, appendicitis, other acute abdomen or other emergent problem. CBC shows no signs of leukocytosis or anemia CMP only shows mild hypokalemia at 3.4, mild hyperchloremia at 115 and mild elevated glucose at 120 Urinalysis shows positive nitrites but without evidence of bacteria or white blood cells. Reassessment: After treatment, the patient is feeling much better, tolerating PO fluids, and shows no signs of dehydration.   Disposition: Discharge home with prompt primary care physician follow up in the next 48 hours. Strict return precautions discussed.    FINAL CLINICAL IMPRESSION(S) / ED DIAGNOSES   Final diagnoses:  Nausea vomiting and diarrhea  Lower urinary tract infectious disease   Rx / DC Orders   ED Discharge Orders          Ordered    promethazine (PHENERGAN) 12.5 MG tablet  Every 6 hours PRN        02/10/22 2234           Note:  This document was prepared using Dragon voice recognition software and may include unintentional dictation errors.   Naaman Plummer, MD 02/11/22 [phone]

## 2022-02-10 NOTE — ED Triage Notes (Signed)
Pt states that she has been vomiting with fever and lower back pain- pt states she was diagnosed with a UTI yesterday- pt states she was given antibiotics but cannot take them as she has been throwing up too much- pt states that she al;so cannot take any of her fluconazole and that the thrush is back

## 2022-03-25 ENCOUNTER — Telehealth (HOSPITAL_COMMUNITY): Payer: Self-pay

## 2022-03-25 NOTE — Telephone Encounter (Signed)
Attempted to contact patient to schedule OP MBS - left voicemail. ?

## 2022-04-01 ENCOUNTER — Encounter: Payer: Self-pay | Admitting: Pulmonary Disease

## 2022-04-01 NOTE — Telephone Encounter (Signed)
CLOSE ENCOUNTER °

## 2022-04-13 ENCOUNTER — Telehealth (HOSPITAL_COMMUNITY): Payer: Self-pay

## 2022-04-13 NOTE — Telephone Encounter (Signed)
2nd attempt to contact patient to schedule OP MBS - left voicemail. ?

## 2022-04-20 ENCOUNTER — Telehealth (HOSPITAL_COMMUNITY): Payer: Self-pay

## 2022-04-20 NOTE — Telephone Encounter (Signed)
3rd attempted to contact patient to schedule OP MBS - left voicemail. If no return call by 5/3 - order will be closed. ?

## 2022-05-19 ENCOUNTER — Encounter: Payer: Self-pay | Admitting: Infectious Diseases

## 2022-06-28 ENCOUNTER — Other Ambulatory Visit: Payer: Self-pay

## 2022-06-28 ENCOUNTER — Encounter: Payer: Self-pay | Admitting: Emergency Medicine

## 2022-06-28 ENCOUNTER — Emergency Department: Payer: BC Managed Care – PPO

## 2022-06-28 ENCOUNTER — Emergency Department
Admission: EM | Admit: 2022-06-28 | Discharge: 2022-06-28 | Disposition: A | Discharge status: Home / Self Care | Payer: BC Managed Care – PPO | Attending: Emergency Medicine | Admitting: Emergency Medicine

## 2022-06-28 DIAGNOSIS — N12 Tubulo-interstitial nephritis, not specified as acute or chronic: Secondary | ICD-10-CM | POA: Insufficient documentation

## 2022-06-28 DIAGNOSIS — R109 Unspecified abdominal pain: Secondary | ICD-10-CM | POA: Diagnosis present

## 2022-06-28 LAB — URINALYSIS, ROUTINE W REFLEX MICROSCOPIC
Bilirubin Urine: NEGATIVE
Glucose, UA: NEGATIVE mg/dL
Hgb urine dipstick: NEGATIVE
Ketones, ur: NEGATIVE mg/dL
Nitrite: NEGATIVE
Protein, ur: NEGATIVE mg/dL
Specific Gravity, Urine: 1.01 (ref 1.005–1.030)
Squamous Epithelial / HPF: NONE SEEN (ref 0–5)
pH: 7 (ref 5.0–8.0)

## 2022-06-28 LAB — CBC WITH DIFFERENTIAL/PLATELET
Abs Immature Granulocytes: 0.04 10*3/uL (ref 0.00–0.07)
Basophils Absolute: 0.1 10*3/uL (ref 0.0–0.1)
Basophils Relative: 1 %
Eosinophils Absolute: 0.1 10*3/uL (ref 0.0–0.5)
Eosinophils Relative: 1 %
HCT: 39.9 % (ref 36.0–46.0)
Hemoglobin: 13.5 g/dL (ref 12.0–15.0)
Immature Granulocytes: 0 %
Lymphocytes Relative: 23 %
Lymphs Abs: 2.3 10*3/uL (ref 0.7–4.0)
MCH: 31.6 pg (ref 26.0–34.0)
MCHC: 33.8 g/dL (ref 30.0–36.0)
MCV: 93.4 fL (ref 80.0–100.0)
Monocytes Absolute: 0.5 10*3/uL (ref 0.1–1.0)
Monocytes Relative: 5 %
Neutro Abs: 7.1 10*3/uL (ref 1.7–7.7)
Neutrophils Relative %: 70 %
Platelets: 290 10*3/uL (ref 150–400)
RBC: 4.27 MIL/uL (ref 3.87–5.11)
RDW: 12.9 % (ref 11.5–15.5)
WBC: 10.1 10*3/uL (ref 4.0–10.5)
nRBC: 0 % (ref 0.0–0.2)

## 2022-06-28 LAB — COMPREHENSIVE METABOLIC PANEL
ALT: 17 U/L (ref 0–44)
AST: 39 U/L (ref 15–41)
Albumin: 4.1 g/dL (ref 3.5–5.0)
Alkaline Phosphatase: 80 U/L (ref 38–126)
Anion gap: 12 (ref 5–15)
BUN: 7 mg/dL (ref 6–20)
CO2: 20 mmol/L — ABNORMAL LOW (ref 22–32)
Calcium: 9.5 mg/dL (ref 8.9–10.3)
Chloride: 109 mmol/L (ref 98–111)
Creatinine, Ser: 0.81 mg/dL (ref 0.44–1.00)
GFR, Estimated: >60 mL/min (ref >60)
Glucose, Bld: 129 mg/dL — ABNORMAL HIGH (ref 70–99)
Potassium: 3.4 mmol/L — ABNORMAL LOW (ref 3.5–5.1)
Sodium: 141 mmol/L (ref 135–145)
Total Bilirubin: 0.8 mg/dL (ref 0.3–1.2)
Total Protein: 7.4 g/dL (ref 6.5–8.1)

## 2022-06-28 LAB — LACTIC ACID, PLASMA
Lactic Acid, Venous: 2.4 mmol/L (ref 0.5–1.9)
Lactic Acid, Venous: 1.5 mmol/L (ref 0.5–1.9)

## 2022-06-28 MED ORDER — SODIUM CHLORIDE 0.9 % IV BOLUS
1000.0000 mL | Freq: Once | INTRAVENOUS | Status: AC
  Administered 2022-06-28: 1000 mL via INTRAVENOUS

## 2022-06-28 MED ORDER — CEFDINIR 300 MG PO CAPS: 300.0000 mg | ORAL_CAPSULE | Freq: Two times a day (BID) | ORAL | 0 refills | Status: AC

## 2022-06-28 MED ORDER — OXYCODONE-ACETAMINOPHEN 5-325 MG PO TABS
1.0000 | ORAL_TABLET | Freq: Once | ORAL | Status: AC
  Administered 2022-06-28: 1 via ORAL
  Filled 2022-06-28: qty 1

## 2022-06-28 MED ORDER — ONDANSETRON 4 MG PO TBDP: 4.0000 mg | ORAL_TABLET | Freq: Three times a day (TID) | ORAL | 0 refills | Status: DC | PRN

## 2022-06-28 MED ORDER — ONDANSETRON HCL 4 MG/2ML IJ SOLN
4.0000 mg | Freq: Once | INTRAMUSCULAR | Status: AC
  Administered 2022-06-28: 4 mg via INTRAVENOUS
  Filled 2022-06-28: qty 2

## 2022-06-28 MED ORDER — SODIUM CHLORIDE 0.9 % IV SOLN
1.0000 g | Freq: Once | INTRAVENOUS | Status: AC
  Administered 2022-06-28: 1 g via INTRAVENOUS
  Filled 2022-06-28: qty 10

## 2022-06-28 MED ORDER — KETOROLAC TROMETHAMINE 30 MG/ML IJ SOLN
15.0000 mg | Freq: Once | INTRAMUSCULAR | Status: AC
  Administered 2022-06-28: 15 mg via INTRAVENOUS
  Filled 2022-06-28: qty 1

## 2022-06-28 NOTE — ED Provider Notes (Signed)
[facility] Provider Note    Event Date/Time   First MD Initiated Contact with Patient 06/28/22 1308     (approximate)   History   Chief Complaint Back Pain and Fever   HPI  [name] Horn is a 46 y.o. female with past medical history of rheumatoid arthritis, DVT, and anxiety who presents to the ED complaining of flank pain.  Patient reports that she was initially treated for pyelonephritis by her PCP earlier this month with ciprofloxacin.  She states that she completed the entire course of antibiotics and was feeling better up until 2 days ago.  She describes increasing pain in her right flank along with fever and nausea.  She states her temperature got as high as 101 two days ago, but she did take Advil 2 hours prior to arrival.  This is been associated with nausea and vomiting, but she denies any changes in her bowel movements.  She does report significant pressure when she goes to urinate, denies any dysuria or hematuria.  She denies any history of kidney stones.     Physical Exam   Triage Vital Signs: ED Triage Vitals  Enc Vitals Group     BP 06/28/22 1200 117/73     Pulse Rate 06/28/22 1200 (!) 103     Resp 06/28/22 1200 18     Temp 06/28/22 1200 98.8 F (37.1 C)     Temp Source 06/28/22 1200 Oral     SpO2 06/28/22 1200 100 %     Weight 06/28/22 1207 190 lb (86.2 kg)     Height 06/28/22 1207 '5\' 4"'$  (1.626 m)     Head Circumference --      Pe[place] Flow --      Pain Score 06/28/22 1207 8     Pain Loc --      Pain Edu? --      Excl. in [place]? --     Most recent vital signs: Vitals:   06/28/22 1200 06/28/22 1325  BP: 117/73 125/87  Pulse: (!) 103 (!) 101  Resp: 18 20  Temp: 98.8 F (37.1 C) 99.2 F (37.3 C)  SpO2: 100% 99%    Constitutional: Alert and oriented. Eyes: Conjunctivae are normal. Head: Atraumatic. Nose: No congestion/rhinnorhea. Mouth/Throat: Mucous membranes are moist.  Cardiovascular: Normal rate, regular rhythm. Grossly  normal heart sounds.  2+ radial pulses bilaterally. Respiratory: Normal respiratory effort.  No retractions. Lungs CTAB. Gastrointestinal: Soft and nontender.  Right CVA tenderness to palpation noted.  No distention. Musculoskeletal: No lower extremity tenderness nor edema.  Neurologic:  Normal speech and language. No gross focal neurologic deficits are appreciated.    ED Results / Procedures / Treatments   Labs (all labs ordered are listed, but only abnormal results are displayed) Labs Reviewed  LACTIC ACID, PLASMA - Abnormal; Notable for the following components:      Result Value   Lactic Acid, Venous 2.4 (*)    All other components within normal limits  COMPREHENSIVE METABOLIC PANEL - Abnormal; Notable for the following components:   Potassium 3.4 (*)    CO2 20 (*)    Glucose, Bld 129 (*)    All other components within normal limits  URINALYSIS, ROUTINE W REFLEX MICROSCOPIC - Abnormal; Notable for the following components:   Color, Urine YELLOW (*)    APPearance CLEAR (*)    Leukocytes,Ua SMALL (*)    Bacteria, UA RARE (*)    All other components within normal limits  URINE  CULTURE  CBC WITH DIFFERENTIAL/PLATELET  LACTIC ACID, PLASMA   RADIOLOGY CT stone protocol reviewed and interpreted by me with no obstructing kidney stones noted, mild inflammatory changes noted in the area of the colon.  PROCEDURES:  Critical Care performed: No  Procedures   MEDICATIONS ORDERED IN ED: Medications  sodium chloride 0.9 % bolus 1,000 mL (1,000 mLs Intravenous New Bag/Given 06/28/22 1438)  ketorolac (TORADOL) 30 MG/ML injection 15 mg (15 mg Intravenous Given 06/28/22 1437)  ondansetron (ZOFRAN) injection 4 mg (4 mg Intravenous Given 06/28/22 1437)  cefTRIAXone (ROCEPHIN) 1 g in sodium chloride 0.9 % 100 mL IVPB (1 g Intravenous New Bag/Given 06/28/22 1437)     IMPRESSION / MDM / ASSESSMENT AND PLAN / ED COURSE  I reviewed the triage vital signs and the nursing notes.                               46 y.o. female with past medical history of rheumatoid arthritis, DVT, and anxiety who presents to the ED complaining of 2 days of increasing flank pain and fever following recent treatment for pyelonephritis.  Patient's presentation is most consistent with acute presentation with potential threat to life or bodily function.  Differential diagnosis includes, but is not limited to, pyelonephritis, kidney stone, cystitis, cholecystitis, biliary colic, hepatitis, appendicitis, dehydration, electrolyte abnormality, sepsis.  Patient well-appearing and in no acute distress, vital signs remarkable for mild tachycardia but otherwise reassuring and do not appear consistent with sepsis.  Initial labs are reassuring with no significant leukocytosis, anemia, electrolyte abnormality, or AKI.  LFTs are unremarkable, lactic acid mildly elevated but given reassuring vital signs I again doubt sepsis.  Urinalysis shows signs of infection and we will send for culture, given her CVA tenderness suspect recurrent pyelonephritis.  We will treat with Rocephin, CT scan performed and shows no evidence of urinary obstruction, does show evidence of chronic colitis.  We will trend lactic acid, if this is improving then patient be appropriate for discharge home with outpatient follow-up.  She was previously treated with Cipro and we will treat with cefdinir.      FINAL CLINICAL IMPRESSION(S) / ED DIAGNOSES   Final diagnoses:  Pyelonephritis     Rx / DC Orders   ED Discharge Orders     None        Note:  This document was prepared using Dragon voice recognition software and may include unintentional dictation errors.   Blake Divine, MD 06/28/22 [phone]

## 2022-06-28 NOTE — ED Triage Notes (Signed)
Pt via POV from home. Pt was on antibiotics for kidney infection and completed her antibiotic on Saturday. States that she is now she is feeling bad again starting Sunday night. Pt c/o R flank, lower back pain, nausea, dysuria, and pelvic pressure. Pt took Advil around 2 hours ago and states that her temp was 101. Pt is A&Ox4 and NAD

## 2022-06-28 NOTE — ED Notes (Signed)
Pt A&O, IV removed, pt given discharge instructions, pt ambulating with steady gait. 

## 2022-06-28 NOTE — ED Provider Notes (Signed)
Patient LA downtrending. She appears clinically well. D/c with outpt tx for possible pyelo vs UTI. Tolerating PO. Dr. Charna Archer provided rx for Cefdinir, supportive care.   Duffy Bruce, MD 06/28/22 1626

## 2022-06-30 LAB — URINE CULTURE

## 2022-07-22 ENCOUNTER — Telehealth: Payer: Self-pay | Admitting: Rheumatology

## 2022-07-22 NOTE — Telephone Encounter (Signed)
Patient left a voicemail stating Dr. Estanislado Pandy referred her to [facility] and the doctor is requesting x-rays, ultrasound, and the avise labwork.  Patient states she would also like to keep Dr. Estanislado Pandy as a doctor.  Patient requested a return call.

## 2022-07-25 NOTE — Telephone Encounter (Signed)
I called patient, CD of x-rays of bil hands and feet from 2020 and [place] labs mailed, unable to send images of [place]

## 2022-08-30 NOTE — Progress Notes (Deleted)
Office Visit Note  Patient: [name] Horn             Date of Birth: 01/22/1976           MRN: [id number]             PCP: Alan Ripper, [place] Referring: Alan Ripper, [place] Visit Date: 09/13/2022 Occupation: '@GUAROCC'$ @  Subjective:    History of Present Illness: [name] Horn is a 46 y.o. female with history of seropositive rheumatoid arthritis and Keratoconjunctivitis sicca.  She is not currently taking any immunosuppressive agents.   Activities of Daily Living:  Patient reports morning stiffness for *** {minute/hour:19697}.   Patient {ACTIONS;DENIES/REPORTS:21021675::"Denies"} nocturnal pain.  Difficulty dressing/grooming: {ACTIONS;DENIES/REPORTS:21021675::"Denies"} Difficulty climbing stairs: {ACTIONS;DENIES/REPORTS:21021675::"Denies"} Difficulty getting out of chair: {ACTIONS;DENIES/REPORTS:21021675::"Denies"} Difficulty using hands for taps, buttons, cutlery, and/or writing: {ACTIONS;DENIES/REPORTS:21021675::"Denies"}  No Rheumatology ROS completed.   PMFS History:  Patient Active Problem List   Diagnosis Date Noted  . Cellulitis, leg 09/14/2021  . 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 COVID-19 07/06/2021  . Leukopenia 07/06/2021  . Fatigue 12/30/2020  . Malaise 12/30/2020  . FUO (fever of unknown origin) 10/05/2020  . Stomatitis 09/03/2020  . Rheumatoid arthritis ([place]) 08/12/2020  . Herpes labialis 08/12/2020  . Transaminitis 08/12/2020  . 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 Intertrigo 06/22/2020  . Interstitial cystitis 05/18/2020  . 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 Chronic rhinitis 04/23/2020  . Adverse food reaction 04/23/2020  . Eosinophilic esophagitis 44/12/270  . Oral candidiasis 04/02/2020  . History of Clostridium difficile infection 04/02/2020  . History of shingles 04/02/2020  . History of asthma 04/02/2020  .  Multiple drug allergies 04/02/2020  . Environmental allergies 04/02/2020  . History of loop recorder 01/19/2020  . PVC (premature ventricular contraction) 01/19/2020  . Uterine cancer ([place]) 01/19/2020  . Colitis 08/14/2019  . Constipation   . Gastroparesis 04/03/2019  . Palpitations 04/10/2018  . Nausea and vomiting 04/26/2017  . Nausea & vomiting 04/25/2017  . Chest pain 04/25/2017  . Enterocolitis due to Clostridium difficile 01/04/2016  . Incisional hernia without obstruction or gangrene 11/18/2015  . Personal history of other diseases of the nervous system and sense organs 10/14/2015  . Disease of pancreas 01/21/2015  . Other specified bacterial agents as the cause of diseases classified elsewhere 01/21/2015  . Radiculopathy of cervical region 12/09/2014  . Brachial neuritis 12/09/2014  . Cervical radicular pain 12/09/2014  . Annual physical exam 01/30/2012  . Melena 01/30/2012  . Pain in ankle 11/24/2010  . ANXIETY 10/11/2010  . DVT 10/11/2010  . Generalized anxiety disorder 10/11/2010  . COSTOCHONDRITIS 03/03/2008  . Cellulitis of toe 12/01/2007  . MENOPAUSE, PREMATURE 07/20/2007  . UTERINE CANCER, HX OF 07/16/2007  . Other postprocedural status(V45.89) 07/16/2007    Past Medical History:  Diagnosis Date  . Cellulitis, leg 09/14/2021  . Complication of anesthesia    " i TAKE A LIITLE BIT LONGER TO WAKRE UP "  . COVID-19 07/06/2021  . DVT (deep venous thrombosis) ([place])    x2  . Fatigue 12/30/2020  . Gastroparesis 08/07/2019  . 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 Herpes labialis 08/12/2020  . Intertrigo 06/22/2020  . Leukopenia 07/06/2021  . Lymphedema   . Malaise 12/30/2020  . RA (rheumatoid arthritis) ([place])   . Rheumatoid arthritis ([place]) 08/12/2020  . Transaminitis 08/12/2020  . Uterine cancer ([place])     Family History  Problem Relation Age of  Onset  . Hypertension Other   . Hyperlipidemia Other   . Colon cancer Other        grandmother  . Hashimoto's thyroiditis  Mother   . Eczema Mother   . Angioedema Mother   . Throat cancer Father   . Arrhythmia Father   . Angioedema Maternal Grandfather    Past Surgical History:  Procedure Laterality Date  . APPENDECTOMY    . cheek biopsy  11/2020  . CHOLECYSTECTOMY    . ESOPHAGOGASTRODUODENOSCOPY N/A 04/30/2017   Procedure: ESOPHAGOGASTRODUODENOSCOPY (EGD);  Surgeon: Teena Irani, MD;  Location: [facility] ENDOSCOPY;  Service: Endoscopy;  Laterality: N/A;  . HERNIA REPAIR     x2  . KNEE SURGERY     x3  . MINIMALLY INVASIVE FORAMINOTOMY CERVICAL SPINE     C6-T1, Nitka  . SAVORY DILATION N/A 04/30/2017   Procedure: SAVORY DILATION;  Surgeon: Teena Irani, MD;  Location: [facility] ENDOSCOPY;  Service: Endoscopy;  Laterality: N/A;  . TONSILLECTOMY    . TOTAL ABDOMINAL HYSTERECTOMY     Sarcoma, s/p XRT   Social History   Social History Narrative  . Not on file   Immunization History  Administered Date(s) Administered  . Influenza, Seasonal, Injecte, Preservative Fre 10/22/2014, 10/23/2015, 09/10/2018  . Influenza,inj,Quad PF,6+ Mos 11/21/2017, 09/10/2018  . Influenza-Unspecified 10/22/2014, 10/23/2015  . PFIZER(Purple Top)SARS-COV-2 Vaccination 12/27/2019, 02/24/2020, 09/09/2020, 03/09/2021  . Pneumococcal Conjugate-13 10/30/2019  . Tdap 04/19/2020     Objective: Vital Signs: There were no vitals taken for this visit.   Physical Exam Vitals and nursing note reviewed.  Constitutional:      Appearance: She is well-developed.  HENT:     Head: Normocephalic and atraumatic.  Eyes:     Conjunctiva/sclera: Conjunctivae normal.  Cardiovascular:     Rate and Rhythm: Normal rate and regular rhythm.     Heart sounds: Normal heart sounds.  Pulmonary:     Effort: Pulmonary effort is normal.     Breath sounds: Normal breath sounds.  Abdominal:     General: Bowel sounds are normal.     Palpations: Abdomen is soft.  Musculoskeletal:     Cervical back: Normal range of motion.  Skin:    General: Skin is warm and dry.      Capillary Refill: Capillary refill takes less than 2 seconds.  Neurological:     Mental Status: She is alert and oriented to person, place, and time.  Psychiatric:        Behavior: Behavior normal.     Musculoskeletal Exam: ***  CDAI Exam: CDAI Score: -- Patient Global: --; Provider Global: -- Swollen: --; Tender: -- Joint Exam 09/13/2022   No joint exam has been documented for this visit   There is currently no information documented on the homunculus. Go to the Rheumatology activity and complete the homunculus joint exam.  Investigation: No additional findings.  Imaging: No results found.  Recent Labs: Lab Results  Component Value Date   WBC 10.1 06/28/2022   HGB 13.5 06/28/2022   PLT 290 06/28/2022   NA 141 06/28/2022   K 3.4 (L) 06/28/2022   CL 109 06/28/2022   CO2 20 (L) 06/28/2022   GLUCOSE 129 (H) 06/28/2022   BUN 7 06/28/2022   CREATININE 0.81 06/28/2022   BILITOT 0.8 06/28/2022   ALKPHOS 80 06/28/2022   AST 39 06/28/2022   ALT 17 06/28/2022   PROT 7.4 06/28/2022   ALBUMIN 4.1 06/28/2022   CALCIUM 9.5 06/28/2022   GFRAA 98 12/30/2020  QFTBGOLDPLUS NEGATIVE 10/23/2019    Speciality Comments: No specialty comments available.  Procedures:  No procedures performed Allergies: Other, Sulfa antibiotics, Sulfonamide derivatives, Benadryl [diphenhydramine hcl], and Silicone   Assessment / Plan:     Visit Diagnoses: No diagnosis found.  Orders: No orders of the defined types were placed in this encounter.  No orders of the defined types were placed in this encounter.   Face-to-face time spent with patient was *** minutes. Greater than 50% of time was spent in counseling and coordination of care.  Follow-Up Instructions: No follow-ups on file.   Earnestine Mealing, CMA  Note - This record has been created using Editor, commissioning.  Chart creation errors have been sought, but may not always  have been located. Such creation errors do not reflect  on  the standard of medical care.

## 2022-09-13 ENCOUNTER — Ambulatory Visit: Payer: BC Managed Care – PPO | Admitting: Physician Assistant

## 2022-09-13 DIAGNOSIS — M7918 Myalgia, other site: Secondary | ICD-10-CM

## 2022-09-13 DIAGNOSIS — M3501 Sicca syndrome with keratoconjunctivitis: Secondary | ICD-10-CM

## 2022-09-13 DIAGNOSIS — I7300 Raynaud's syndrome without gangrene: Secondary | ICD-10-CM

## 2022-09-13 DIAGNOSIS — M0579 Rheumatoid arthritis with rheumatoid factor of multiple sites without organ or systems involvement: Secondary | ICD-10-CM

## 2022-09-13 DIAGNOSIS — M5030 Other cervical disc degeneration, unspecified cervical region: Secondary | ICD-10-CM

## 2022-09-13 DIAGNOSIS — K141 Geographic tongue: Secondary | ICD-10-CM

## 2022-09-13 DIAGNOSIS — Z8619 Personal history of other infectious and parasitic diseases: Secondary | ICD-10-CM

## 2022-09-13 DIAGNOSIS — Z8542 Personal history of malignant neoplasm of other parts of uterus: Secondary | ICD-10-CM

## 2022-09-13 DIAGNOSIS — D806 Antibody deficiency with near-normal immunoglobulins or with hyperimmunoglobulinemia: Secondary | ICD-10-CM

## 2022-09-13 DIAGNOSIS — Z79899 Other long term (current) drug therapy: Secondary | ICD-10-CM

## 2022-09-13 DIAGNOSIS — K200 Eosinophilic esophagitis: Secondary | ICD-10-CM

## 2022-09-13 DIAGNOSIS — G5601 Carpal tunnel syndrome, right upper limb: Secondary | ICD-10-CM

## 2022-09-13 DIAGNOSIS — K3184 Gastroparesis: Secondary | ICD-10-CM

## 2022-09-13 DIAGNOSIS — Z8659 Personal history of other mental and behavioral disorders: Secondary | ICD-10-CM

## 2022-09-16 NOTE — Progress Notes (Deleted)
Office Visit Note  Patient: [name] Horn             Date of Birth: 04-Apr-1976           MRN: [id number]             PCP: Alan Ripper, [place] Referring: Alan Ripper, [place] Visit Date: 09/27/2022 Occupation: '@GUAROCC'$ @  Subjective:    History of Present Illness: Rocsi GEORGE ALCANTAR is a 46 y.o. female with history of seropositive rheumatoid arthritis.   Activities of Daily Living:  Patient reports morning stiffness for *** {minute/hour:19697}.   Patient {ACTIONS;DENIES/REPORTS:21021675::"Denies"} nocturnal pain.  Difficulty dressing/grooming: {ACTIONS;DENIES/REPORTS:21021675::"Denies"} Difficulty climbing stairs: {ACTIONS;DENIES/REPORTS:21021675::"Denies"} Difficulty getting out of chair: {ACTIONS;DENIES/REPORTS:21021675::"Denies"} Difficulty using hands for taps, buttons, cutlery, and/or writing: {ACTIONS;DENIES/REPORTS:21021675::"Denies"}  No Rheumatology ROS completed.   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   Chronic rhinitis 04/23/2020   Adverse food reaction 78/29/5621   Eosinophilic esophagitis 30/86/5784   Oral candidiasis 04/02/2020   History of Clostridium difficile infection 04/02/2020   History of shingles 04/02/2020   History of asthma 04/02/2020   Multiple drug allergies 04/02/2020   Environmental allergies 04/02/2020   History of loop recorder 01/19/2020    PVC (premature ventricular contraction) 01/19/2020   Uterine cancer ([place]) 01/19/2020   Colitis 08/14/2019   Constipation    Gastroparesis 04/03/2019   Palpitations 04/10/2018   Nausea and vomiting 04/26/2017   Nausea & vomiting 04/25/2017   Chest pain 04/25/2017   Enterocolitis due to Clostridium difficile 01/04/2016   Incisional hernia without obstruction or gangrene 11/18/2015   Personal history of other diseases of the nervous system and sense organs 10/14/2015   Disease of pancreas 01/21/2015   Other specified bacterial agents as the cause of diseases classified elsewhere 01/21/2015   Radiculopathy of cervical region 12/09/2014   Brachial neuritis 12/09/2014   Cervical radicular pain 12/09/2014   Annual physical exam 01/30/2012   Melena 01/30/2012   Pain in ankle 11/24/2010   ANXIETY 10/11/2010   DVT 10/11/2010   Generalized anxiety disorder 10/11/2010   COSTOCHONDRITIS 03/03/2008   Cellulitis of toe 12/01/2007   MENOPAUSE, PREMATURE 07/20/2007   UTERINE CANCER, HX OF 07/16/2007   Other postprocedural status(V45.89) 07/16/2007    Past Medical History:  Diagnosis Date   Cellulitis, leg 6/96/2952   Complication of anesthesia    " i TAKE A LIITLE BIT LONGER TO WAKRE UP "   COVID-19 07/06/2021   DVT (deep venous thrombosis) ([facility])    x2   Fatigue 12/30/2020   Gastroparesis 08/07/2019   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   Herpes labialis 08/12/2020   Intertrigo 06/22/2020   Leukopenia 07/06/2021   Lymphedema    Malaise 12/30/2020   RA (rheumatoid arthritis) ([place])    Rheumatoid arthritis ([place]) 08/12/2020   Transaminitis 08/12/2020   Uterine cancer ([place])     Family History  Problem Relation Age of Onset   Hypertension Other    Hyperlipidemia Other  Colon cancer Other        grandmother   Hashimoto's thyroiditis Mother    Eczema Mother    Angioedema Mother    Throat cancer Father    Arrhythmia Father    Angioedema Maternal Grandfather    Past Surgical History:  Procedure  Laterality Date   APPENDECTOMY     cheek biopsy  11/2020   CHOLECYSTECTOMY     ESOPHAGOGASTRODUODENOSCOPY N/A 04/30/2017   Procedure: ESOPHAGOGASTRODUODENOSCOPY (EGD);  Surgeon: Teena Irani, MD;  Location: [facility] ENDOSCOPY;  Service: Endoscopy;  Laterality: N/A;   HERNIA REPAIR     x2   KNEE SURGERY     x3   MINIMALLY INVASIVE FORAMINOTOMY CERVICAL SPINE     C6-T1, Nitka   SAVORY DILATION N/A 04/30/2017   Procedure: SAVORY DILATION;  Surgeon: Teena Irani, MD;  Location: [place];  Service: Endoscopy;  Laterality: N/A;   TONSILLECTOMY     TOTAL ABDOMINAL HYSTERECTOMY     Sarcoma, s/p XRT   Social History   Social History Narrative   Not on file   Immunization History  Administered Date(s) Administered   Influenza, Seasonal, Injecte, Preservative Fre 10/22/2014, 10/23/2015, 09/10/2018   Influenza,inj,Quad PF,6+ Mos 11/21/2017, 09/10/2018   Influenza-Unspecified 10/22/2014, 10/23/2015   PFIZER(Purple Top)SARS-COV-2 Vaccination 12/27/2019, 02/24/2020, 09/09/2020, 03/09/2021   Pneumococcal Conjugate-13 10/30/2019   Tdap 04/19/2020     Objective: Vital Signs: There were no vitals taken for this visit.   Physical Exam Vitals and nursing note reviewed.  Constitutional:      Appearance: She is well-developed.  HENT:     Head: Normocephalic and atraumatic.  Eyes:     Conjunctiva/sclera: Conjunctivae normal.  Cardiovascular:     Rate and Rhythm: Normal rate and regular rhythm.     Heart sounds: Normal heart sounds.  Pulmonary:     Effort: Pulmonary effort is normal.     Breath sounds: Normal breath sounds.  Abdominal:     General: Bowel sounds are normal.     Palpations: Abdomen is soft.  Musculoskeletal:     Cervical back: Normal range of motion.  Skin:    General: Skin is warm and dry.     Capillary Refill: Capillary refill takes less than 2 seconds.  Neurological:     Mental Status: She is alert and oriented to person, place, and time.  Psychiatric:        Behavior:  Behavior normal.      Musculoskeletal Exam: ***  CDAI Exam: CDAI Score: -- Patient Global: --; Provider Global: -- Swollen: --; Tender: -- Joint Exam 09/27/2022   No joint exam has been documented for this visit   There is currently no information documented on the homunculus. Go to the Rheumatology activity and complete the homunculus joint exam.  Investigation: No additional findings.  Imaging: No results found.  Recent Labs: Lab Results  Component Value Date   WBC 10.1 06/28/2022   HGB 13.5 06/28/2022   PLT 290 06/28/2022   NA 141 06/28/2022   K 3.4 (L) 06/28/2022   CL 109 06/28/2022   CO2 20 (L) 06/28/2022   GLUCOSE 129 (H) 06/28/2022   BUN 7 06/28/2022   CREATININE 0.81 06/28/2022   BILITOT 0.8 06/28/2022   ALKPHOS 80 06/28/2022   AST 39 06/28/2022   ALT 17 06/28/2022   PROT 7.4 06/28/2022   ALBUMIN 4.1 06/28/2022   CALCIUM 9.5 06/28/2022   GFRAA 98 12/30/2020   QFTBGOLDPLUS NEGATIVE 10/23/2019    Speciality Comments: No specialty comments available.  Procedures:  No procedures performed Allergies: Other, Sulfa antibiotics, Sulfonamide derivatives, Benadryl [diphenhydramine hcl], and Silicone   Assessment / Plan:     Visit Diagnoses: No diagnosis found.  Orders: No orders of the defined types were placed in this encounter.  No orders of the defined types were placed in this encounter.   Face-to-face time spent with patient was *** minutes. Greater than 50% of time was spent in counseling and coordination of care.  Follow-Up Instructions: No follow-ups on file.   Earnestine Mealing, CMA  Note - This record has been created using Editor, commissioning.  Chart creation errors have been sought, but may not always  have been located. Such creation errors do not reflect on  the standard of medical care.

## 2022-09-25 ENCOUNTER — Emergency Department: Payer: BC Managed Care – PPO

## 2022-09-25 ENCOUNTER — Encounter: Payer: Self-pay | Admitting: Intensive Care

## 2022-09-25 ENCOUNTER — Emergency Department
Admission: EM | Admit: 2022-09-25 | Discharge: 2022-09-25 | Disposition: A | Discharge status: Home / Self Care | Payer: BC Managed Care – PPO | Attending: Emergency Medicine | Admitting: Emergency Medicine

## 2022-09-25 ENCOUNTER — Other Ambulatory Visit: Payer: Self-pay

## 2022-09-25 DIAGNOSIS — R112 Nausea with vomiting, unspecified: Secondary | ICD-10-CM | POA: Insufficient documentation

## 2022-09-25 DIAGNOSIS — R0789 Other chest pain: Secondary | ICD-10-CM | POA: Diagnosis not present

## 2022-09-25 DIAGNOSIS — R079 Chest pain, unspecified: Secondary | ICD-10-CM | POA: Diagnosis present

## 2022-09-25 DIAGNOSIS — K200 Eosinophilic esophagitis: Secondary | ICD-10-CM | POA: Insufficient documentation

## 2022-09-25 HISTORY — DX: Noninfective gastroenteritis and colitis, unspecified: K52.9

## 2022-09-25 LAB — HEPATIC FUNCTION PANEL
ALT: 17 U/L (ref 0–44)
AST: 53 U/L — ABNORMAL HIGH (ref 15–41)
Albumin: 3.6 g/dL (ref 3.5–5.0)
Alkaline Phosphatase: 70 U/L (ref 38–126)
Bilirubin, Direct: 0.1 mg/dL (ref 0.0–0.2)
Indirect Bilirubin: 0.8 mg/dL (ref 0.3–0.9)
Total Bilirubin: 0.9 mg/dL (ref 0.3–1.2)
Total Protein: 6.7 g/dL (ref 6.5–8.1)

## 2022-09-25 LAB — BASIC METABOLIC PANEL
Anion gap: 4 — ABNORMAL LOW (ref 5–15)
BUN: 6 mg/dL (ref 6–20)
CO2: 21 mmol/L — ABNORMAL LOW (ref 22–32)
Calcium: 9.2 mg/dL (ref 8.9–10.3)
Chloride: 118 mmol/L — ABNORMAL HIGH (ref 98–111)
Creatinine, Ser: 0.77 mg/dL (ref 0.44–1.00)
GFR, Estimated: >60 mL/min (ref >60)
Glucose, Bld: 130 mg/dL — ABNORMAL HIGH (ref 70–99)
Potassium: 4.4 mmol/L (ref 3.5–5.1)
Sodium: 143 mmol/L (ref 135–145)

## 2022-09-25 LAB — LIPASE, BLOOD: Lipase: 25 U/L (ref 11–51)

## 2022-09-25 LAB — CBC
HCT: 36.1 % (ref 36.0–46.0)
Hemoglobin: 12.1 g/dL (ref 12.0–15.0)
MCH: 31.1 pg (ref 26.0–34.0)
MCHC: 33.5 g/dL (ref 30.0–36.0)
MCV: 92.8 fL (ref 80.0–100.0)
Platelets: 229 10*3/uL (ref 150–400)
RBC: 3.89 MIL/uL (ref 3.87–5.11)
RDW: 12.9 % (ref 11.5–15.5)
WBC: 6.5 10*3/uL (ref 4.0–10.5)
nRBC: 0 % (ref 0.0–0.2)

## 2022-09-25 LAB — TROPONIN I (HIGH SENSITIVITY): Troponin I (High Sensitivity): 4 ng/L (ref <18)

## 2022-09-25 MED ORDER — OXYCODONE HCL 5 MG PO TABS: 5.0000 mg | ORAL_TABLET | Freq: Three times a day (TID) | ORAL | 0 refills | Status: DC | PRN

## 2022-09-25 MED ORDER — ALUM & MAG HYDROXIDE-SIMETH 200-200-20 MG/5ML PO SUSP
30.0000 mL | Freq: Once | ORAL | Status: AC
  Administered 2022-09-25: 30 mL via ORAL
  Filled 2022-09-25: qty 30

## 2022-09-25 MED ORDER — SODIUM CHLORIDE 0.9 % IV BOLUS
1000.0000 mL | Freq: Once | INTRAVENOUS | Status: AC
  Administered 2022-09-25: 1000 mL via INTRAVENOUS

## 2022-09-25 MED ORDER — SUCRALFATE 1 G PO TABS: 1.0000 g | ORAL_TABLET | Freq: Four times a day (QID) | ORAL | 0 refills | Status: DC

## 2022-09-25 MED ORDER — ONDANSETRON HCL 4 MG/2ML IJ SOLN
4.0000 mg | Freq: Once | INTRAMUSCULAR | Status: AC
  Administered 2022-09-25: 4 mg via INTRAVENOUS
  Filled 2022-09-25: qty 2

## 2022-09-25 MED ORDER — OXYCODONE HCL 5 MG PO TABS
5.0000 mg | ORAL_TABLET | Freq: Once | ORAL | Status: AC
  Administered 2022-09-25: 5 mg via ORAL
  Filled 2022-09-25: qty 1

## 2022-09-25 MED ORDER — IOHEXOL 350 MG/ML SOLN
100.0000 mL | Freq: Once | INTRAVENOUS | Status: AC | PRN
  Administered 2022-09-25: 100 mL via INTRAVENOUS

## 2022-09-25 MED ORDER — MORPHINE SULFATE (PF) 4 MG/ML IV SOLN
6.0000 mg | Freq: Once | INTRAVENOUS | Status: AC
  Administered 2022-09-25: 6 mg via INTRAVENOUS
  Filled 2022-09-25: qty 2

## 2022-09-25 MED ORDER — PROMETHAZINE HCL 25 MG PO TABS: 25.0000 mg | ORAL_TABLET | Freq: Four times a day (QID) | ORAL | 0 refills | Status: DC | PRN

## 2022-09-25 NOTE — ED Notes (Addendum)
Dr Isaacs at bedside 

## 2022-09-25 NOTE — ED Triage Notes (Signed)
Patient c/o central, sharp chest pain since this AM. Reports radiation into neck, making it feel stiff. Also c/o bilateral hand cramping. A&O x4 in triage

## 2022-09-25 NOTE — ED Provider Notes (Signed)
[facility] Provider Note    Event Date/Time   First MD Initiated Contact with Patient 09/25/22 1645     (approximate)   History   Chest Pain   HPI  [name] Horn is a 46 y.o. female here with chest pain.  The patient has a complex past medical history including eosinophilic esophagitis, rheumatoid arthritis, prior DVTs, lymphedema, here with chest pain.  The patient states that she currently has a loop recorder and she has had a history of recurrent SVTs and arrhythmias.  She feels like her heart rate has been increasingly over the last several weeks to months.  Today, she developed a sharp, but also aching, pressure-like pain in her left parasternal area that then radiated up towards her neck.  She felt like her heart was beating quickly.  She felt short of breath and mildly lightheaded.  This is somewhat new for her.  She then felt nauseous.  She did vomit in the emergency department waiting room as well.  No blood in her emesis.  No history of ulcers.  She does note that she recently switched medications for her eosinophilic esophagitis.     Physical Exam   Triage Vital Signs: ED Triage Vitals  Enc Vitals Group     BP 09/25/22 1530 118/88     Pulse Rate 09/25/22 1530 95     Resp 09/25/22 1530 16     Temp 09/25/22 1530 98.9 F (37.2 C)     Temp Source 09/25/22 1530 Oral     SpO2 09/25/22 1530 98 %     Weight 09/25/22 1527 195 lb (88.5 kg)     Height 09/25/22 1527 '5\' 4"'$  (1.626 m)     Head Circumference --      Peak Flow --      Pain Score 09/25/22 1527 7     Pain Loc --      Pain Edu? --      Excl. in [place]? --     Most recent vital signs: Vitals:   09/25/22 2000 09/25/22 2030  BP: 112/81 109/76  Pulse: 86 84  Resp: (!) 22 (!) 23  Temp:    SpO2: 97% 96%     General: Awake, no distress.  CV:  Good peripheral perfusion.  Tachycardic.  Regular rhythm. Resp:  Normal effort.  Lungs clear to auscultation bilaterally with normal work of  breathing. Abd:  No distention.  Tenderness to palpation in the epigastric and left upper quadrant area.  No rebound or guarding. Other:  2+ edema bilateral lower extremities with chronic lymphedema.   ED Results / Procedures / Treatments   Labs (all labs ordered are listed, but only abnormal results are displayed) Labs Reviewed  BASIC METABOLIC PANEL - Abnormal; Notable for the following components:      Result Value   Chloride 118 (*)    CO2 21 (*)    Glucose, Bld 130 (*)    Anion gap 4 (*)    All other components within normal limits  HEPATIC FUNCTION PANEL - Abnormal; Notable for the following components:   AST 53 (*)    All other components within normal limits  CBC  LIPASE, BLOOD  TROPONIN I (HIGH SENSITIVITY)     EKG Sinus tachycardia, ventricular rate 102.  PR 148, QRS 68, QTc 435.  No acute ST elevations or depressions.  No EKG evidence of acute ischemia or infarct.   RADIOLOGY Chest x-ray: No active disease   I  also independently reviewed and agree with radiologist interpretations.   PROCEDURES:  Critical Care performed: No   MEDICATIONS ORDERED IN ED: Medications  alum & mag hydroxide-simeth (MAALOX/MYLANTA) 200-200-20 MG/5ML suspension 30 mL (30 mLs Oral Given 09/25/22 1815)  ondansetron (ZOFRAN) injection 4 mg (4 mg Intravenous Given 09/25/22 1748)  sodium chloride 0.9 % bolus 1,000 mL (0 mLs Intravenous Stopped 09/25/22 2009)  iohexol (OMNIPAQUE) 350 MG/ML injection 100 mL (100 mLs Intravenous Contrast Given 09/25/22 1823)  morphine (PF) 4 MG/ML injection 6 mg (6 mg Intravenous Given 09/25/22 1854)  oxyCODONE (Oxy IR/ROXICODONE) immediate release tablet 5 mg (5 mg Oral Given 09/25/22 2050)     IMPRESSION / MDM / ASSESSMENT AND PLAN / ED COURSE  I reviewed the triage vital signs and the nursing notes.                               The patient is on the cardiac monitor to evaluate for evidence of arrhythmia and/or significant heart rate  changes.   Ddx:  Differential includes the following, with pertinent life- or limb-threatening emergencies considered:  Esophagitis, esophageal spasm, ACS, arrhythmia, MSK chest pain, thoracic back pain radiating to chest  Patient's presentation is most consistent with acute presentation with potential threat to life or bodily function.  MDM:  46 yo F with extensive PMHx including EOE, RA, prior DVT, lymphedema here with chest pain, nausea, vomiting. Suspect GI related pain from EOE as pt was recently started on a new medication for this, pain does radiate from epigatric area up to throat, and she has had associated n/v. CT Angio obtained given pleuritic nature and comorbidities with h/o DVTs, and this shows no PE, PNA, or other abnormality. CT A/P obtained as well given her history and this shows no signs of pancreatitis, cholecystitis, or other pathology. Pt pain improved with meds in ED. Labs very reassuring. BMP shows normal renal function. CBC without leukocytosis or anemia. Trop negative despite constant sx >12 hr, EKG nonischemic, doubt ACS. LFTs unremarkable. Lipase normal.   Suspect atypical CP related to her EOE. Will tx with carafate, phenergan. Pt has a h/o chronic pain as well - will give a brief course of breakthrough meds PRN but have asked her to f/u with GI physician re: further work-up and sx management.    MEDICATIONS GIVEN IN ED: Medications  alum & mag hydroxide-simeth (MAALOX/MYLANTA) 200-200-20 MG/5ML suspension 30 mL (30 mLs Oral Given 09/25/22 1815)  ondansetron (ZOFRAN) injection 4 mg (4 mg Intravenous Given 09/25/22 1748)  sodium chloride 0.9 % bolus 1,000 mL (0 mLs Intravenous Stopped 09/25/22 2009)  iohexol (OMNIPAQUE) 350 MG/ML injection 100 mL (100 mLs Intravenous Contrast Given 09/25/22 1823)  morphine (PF) 4 MG/ML injection 6 mg (6 mg Intravenous Given 09/25/22 1854)  oxyCODONE (Oxy IR/ROXICODONE) immediate release tablet 5 mg (5 mg Oral Given 09/25/22 2050)      Consults:     EMR reviewed  Reviewed prior [facility] records     FINAL CLINICAL IMPRESSION(S) / ED DIAGNOSES   Final diagnoses:  Atypical chest pain  Eosinophilic esophagitis     Rx / DC Orders   ED Discharge Orders          Ordered    sucralfate (CARAFATE) 1 g tablet  4 times daily        09/25/22 1943    oxyCODONE (ROXICODONE) 5 MG immediate release tablet  Every 8 hours PRN  09/25/22 1943    promethazine (PHENERGAN) 25 MG tablet  Every 6 hours PRN        09/25/22 1943             Note:  This document was prepared using Dragon voice recognition software and may include unintentional dictation errors.   Duffy Bruce, MD 09/25/22 2225

## 2022-09-25 NOTE — ED Notes (Signed)
Pt ambulatory to restroom with independent steady gait °

## 2022-09-27 ENCOUNTER — Ambulatory Visit: Payer: BC Managed Care – PPO | Admitting: Physician Assistant

## 2022-09-27 DIAGNOSIS — M0579 Rheumatoid arthritis with rheumatoid factor of multiple sites without organ or systems involvement: Secondary | ICD-10-CM

## 2022-09-27 DIAGNOSIS — I7300 Raynaud's syndrome without gangrene: Secondary | ICD-10-CM

## 2022-09-27 DIAGNOSIS — G5601 Carpal tunnel syndrome, right upper limb: Secondary | ICD-10-CM

## 2022-09-27 DIAGNOSIS — M3501 Sicca syndrome with keratoconjunctivitis: Secondary | ICD-10-CM

## 2022-09-27 DIAGNOSIS — K3184 Gastroparesis: Secondary | ICD-10-CM

## 2022-09-27 DIAGNOSIS — D806 Antibody deficiency with near-normal immunoglobulins or with hyperimmunoglobulinemia: Secondary | ICD-10-CM

## 2022-09-27 DIAGNOSIS — Z79899 Other long term (current) drug therapy: Secondary | ICD-10-CM

## 2022-09-27 DIAGNOSIS — K141 Geographic tongue: Secondary | ICD-10-CM

## 2022-09-27 DIAGNOSIS — M5030 Other cervical disc degeneration, unspecified cervical region: Secondary | ICD-10-CM

## 2022-09-27 DIAGNOSIS — M7918 Myalgia, other site: Secondary | ICD-10-CM

## 2022-09-27 DIAGNOSIS — Z8542 Personal history of malignant neoplasm of other parts of uterus: Secondary | ICD-10-CM

## 2022-09-27 DIAGNOSIS — Z8659 Personal history of other mental and behavioral disorders: Secondary | ICD-10-CM

## 2022-09-27 DIAGNOSIS — K200 Eosinophilic esophagitis: Secondary | ICD-10-CM

## 2022-09-27 DIAGNOSIS — Z8619 Personal history of other infectious and parasitic diseases: Secondary | ICD-10-CM

## 2022-10-04 ENCOUNTER — Ambulatory Visit: Payer: BC Managed Care – PPO | Attending: Physician Assistant | Admitting: Physician Assistant

## 2022-10-04 ENCOUNTER — Encounter: Payer: Self-pay | Admitting: Physician Assistant

## 2022-10-04 VITALS — BP 123/85 | HR 90 | Resp 18 | Ht 64.0 in | Wt 182.6 lb

## 2022-10-04 DIAGNOSIS — G5601 Carpal tunnel syndrome, right upper limb: Secondary | ICD-10-CM

## 2022-10-04 DIAGNOSIS — M0579 Rheumatoid arthritis with rheumatoid factor of multiple sites without organ or systems involvement: Secondary | ICD-10-CM

## 2022-10-04 DIAGNOSIS — M3501 Sicca syndrome with keratoconjunctivitis: Secondary | ICD-10-CM

## 2022-10-04 DIAGNOSIS — U071 COVID-19: Secondary | ICD-10-CM

## 2022-10-04 DIAGNOSIS — Z8619 Personal history of other infectious and parasitic diseases: Secondary | ICD-10-CM

## 2022-10-04 DIAGNOSIS — K200 Eosinophilic esophagitis: Secondary | ICD-10-CM

## 2022-10-04 DIAGNOSIS — D806 Antibody deficiency with near-normal immunoglobulins or with hyperimmunoglobulinemia: Secondary | ICD-10-CM

## 2022-10-04 DIAGNOSIS — Z79899 Other long term (current) drug therapy: Secondary | ICD-10-CM | POA: Diagnosis not present

## 2022-10-04 DIAGNOSIS — K141 Geographic tongue: Secondary | ICD-10-CM

## 2022-10-04 DIAGNOSIS — M5030 Other cervical disc degeneration, unspecified cervical region: Secondary | ICD-10-CM

## 2022-10-04 DIAGNOSIS — Z8659 Personal history of other mental and behavioral disorders: Secondary | ICD-10-CM

## 2022-10-04 DIAGNOSIS — I7300 Raynaud's syndrome without gangrene: Secondary | ICD-10-CM

## 2022-10-04 DIAGNOSIS — K3184 Gastroparesis: Secondary | ICD-10-CM

## 2022-10-04 DIAGNOSIS — M7918 Myalgia, other site: Secondary | ICD-10-CM

## 2022-10-04 DIAGNOSIS — Z8542 Personal history of malignant neoplasm of other parts of uterus: Secondary | ICD-10-CM

## 2022-10-04 NOTE — Progress Notes (Signed)
Office Visit Note  Patient: [name] Horn             Date of Birth: 14-Jan-1976           MRN: [id number]             PCP: Alan Ripper, [place] Referring: Alan Ripper, [place] Visit Date: 10/04/2022 Occupation: '@GUAROCC'$ @  Subjective:  Pain in multiple joints    History of Present Illness: [name] Horn is a 46 y.o. female with history of seropositive rheumatoid arthritis, myofascial pain, and DDD.  Patient is not currently taking any immunosuppressive agents.  Patient was last seen in the office by Dr. Estanislado Pandy on 09/14/2021.  She was evaluated by Dr. Barbee Shropshire at [facility] rheumatology for a second opinion on 07/04/2022.  Updated lab work was ordered at that time.  She had updated x-rays of both hands at her PCPs office.  According to the patient she is not scheduled for follow-up at [facility] rheumatology until February 2024.  She would like to continue to follow-up with our office if possible.  Patient reports that she continues to experience pain and stiffness in both hands, both wrists, and both feet.  She is also noticed increased discomfort and stiffness in her shoulders and both hips.  There was discussion of possible use of Plaquenil in the future with the [facility] rheumatologist.  Apprehensive to proceed with reinitiating methotrexate or any Biologics due to history of recurrent candidal infections.Patient reports that she continues to experience recurrent candidal infections.  She has been taking Diflucan Monday, Wednesday, and Fridays.  If she experiences signs of a flare she increases the dose of Diflucan to once daily for 1 week. Patient reports that she establish care with the lymphedema clinic yesterday at [facility].  Activities of Daily Living:  Patient reports morning stiffness for 1 hour.   Patient Reports nocturnal pain.  Difficulty dressing/grooming: Denies Difficulty climbing stairs: Reports Difficulty getting out of chair: Reports Difficulty using hands for taps,  buttons, cutlery, and/or writing: Reports  Review of Systems  Constitutional:  Positive for fatigue.  HENT:  Positive for sore throat and mouth dryness. Negative for mouth sores and nose dryness.   Eyes:  Positive for dryness. Negative for pain and visual disturbance.  Respiratory:  Negative for cough, hemoptysis and difficulty breathing.   Cardiovascular:  Positive for palpitations. Negative for hypertension and swelling in legs/feet.  Gastrointestinal:  Negative for blood in stool, constipation and diarrhea.  Endocrine: Negative for increased urination.  Genitourinary:  Negative for painful urination and involuntary urination.  Musculoskeletal:  Positive for joint pain, gait problem, joint pain, joint swelling, myalgias, muscle weakness, morning stiffness, muscle tenderness and myalgias.  Skin:  Positive for color change. Negative for pallor, rash, hair loss, nodules/bumps, skin tightness, ulcers and sensitivity to sunlight.  Allergic/Immunologic: Positive for susceptible to infections.  Neurological:  Positive for numbness and headaches. Negative for dizziness and weakness.  Hematological:  Negative for swollen glands.  Psychiatric/Behavioral:  Positive for sleep disturbance. Negative for depressed mood. The patient is not nervous/anxious.     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   Chronic  rhinitis 04/23/2020   Adverse food reaction 16/09/9603   Eosinophilic esophagitis 54/08/8118   Oral candidiasis 04/02/2020   History of Clostridium difficile infection 04/02/2020   History of shingles 04/02/2020   History of asthma 04/02/2020   Multiple drug allergies 04/02/2020   Environmental allergies 04/02/2020   History of loop recorder 01/19/2020   PVC (premature ventricular contraction) 01/19/2020   Uterine cancer ([place]) 01/19/2020   Colitis 08/14/2019   Constipation    Gastroparesis 04/03/2019   Palpitations 04/10/2018   Nausea and vomiting 04/26/2017   Nausea & vomiting 04/25/2017   Chest pain 04/25/2017   Enterocolitis due to Clostridium difficile 01/04/2016   Incisional hernia without obstruction or gangrene 11/18/2015   Personal history of other diseases of the nervous system and sense organs 10/14/2015   Disease of pancreas 01/21/2015   Other specified bacterial agents as the cause of diseases classified elsewhere 01/21/2015   Radiculopathy of cervical region 12/09/2014   Brachial neuritis 12/09/2014   Cervical radicular pain 12/09/2014   Annual physical exam 01/30/2012   Melena 01/30/2012   Pain in ankle 11/24/2010   ANXIETY 10/11/2010   DVT 10/11/2010   Generalized anxiety disorder 10/11/2010   COSTOCHONDRITIS 03/03/2008   Cellulitis of toe 12/01/2007   MENOPAUSE, PREMATURE 07/20/2007   UTERINE CANCER, HX OF 07/16/2007   Other postprocedural status(V45.89) 07/16/2007    Past Medical History:  Diagnosis Date   Cellulitis, leg 14/78/2956   Complication of anesthesia    " i TAKE A LIITLE BIT LONGER TO WAKRE UP "   COVID-19 07/06/2021   DVT (deep venous thrombosis) (HCC)    x2   Fatigue 12/30/2020   Gastroparesis 08/07/2019   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   Herpes labialis 08/12/2020   IBD (inflammatory bowel disease)    Intertrigo 06/22/2020   Leukopenia 07/06/2021   Lymphedema    Malaise 12/30/2020   RA (rheumatoid arthritis) ([place])    Rheumatoid  arthritis ([place]) 08/12/2020   Transaminitis 08/12/2020   Uterine cancer ([place])     Family History  Problem Relation Age of Onset   Hashimoto's thyroiditis Mother    Eczema Mother    Angioedema Mother    Colonic polyp Mother    Throat cancer Father    Arrhythmia Father    Angioedema Maternal Grandfather    Hypertension Other    Hyperlipidemia Other    Colon cancer Other        grandmother   Past Surgical History:  Procedure Laterality Date   APPENDECTOMY     cheek biopsy  11/2020   CHOLECYSTECTOMY     ESOPHAGOGASTRODUODENOSCOPY N/A 04/30/2017   Procedure: ESOPHAGOGASTRODUODENOSCOPY (EGD);  Surgeon: Teena Irani, MD;  Location: [facility] ENDOSCOPY;  Service: Endoscopy;  Laterality: N/A;   HERNIA REPAIR     x2   KNEE SURGERY     x3   MINIMALLY INVASIVE FORAMINOTOMY CERVICAL SPINE     C6-T1, Nitka   SAVORY DILATION N/A 04/30/2017   Procedure: SAVORY DILATION;  Surgeon: Teena Irani, MD;  Location: [place];  Service: Endoscopy;  Laterality: N/A;   TONSILLECTOMY     TOTAL ABDOMINAL HYSTERECTOMY     Sarcoma, s/p XRT   Social History   Social History Narrative   Not on file   Immunization History  Administered Date(s) Administered  Influenza, Seasonal, Injecte, Preservative Fre 10/22/2014, 10/23/2015, 09/10/2018   Influenza,inj,Quad PF,6+ Mos 11/21/2017, 09/10/2018   Influenza-Unspecified 10/22/2014, 10/23/2015   PFIZER(Purple Top)SARS-COV-2 Vaccination 12/27/2019, 02/24/2020, 09/09/2020, 03/09/2021   Pneumococcal Conjugate-13 10/30/2019   Tdap 04/19/2020     Objective: Vital Signs: BP 123/85 (BP Location: Left Arm, Patient Position: Sitting, Cuff Size: Normal)   Pulse 90   Resp 18   Ht '5\' 4"'$  (1.626 m)   Wt 182 lb 9.6 oz (82.8 kg)   BMI 31.34 kg/m    Physical Exam Vitals and nursing note reviewed.  Constitutional:      Appearance: She is well-developed.  HENT:     Head: Normocephalic and atraumatic.  Eyes:     Conjunctiva/sclera: Conjunctivae normal.   Cardiovascular:     Rate and Rhythm: Normal rate and regular rhythm.     Heart sounds: Normal heart sounds.  Pulmonary:     Effort: Pulmonary effort is normal.     Breath sounds: Normal breath sounds.  Abdominal:     General: Bowel sounds are normal.     Palpations: Abdomen is soft.  Musculoskeletal:     Cervical back: Normal range of motion.  Skin:    General: Skin is warm and dry.     Capillary Refill: Capillary refill takes less than 2 seconds.  Neurological:     Mental Status: She is alert and oriented to person, place, and time.  Psychiatric:        Behavior: Behavior normal.      Musculoskeletal Exam: C-spine, thoracic spine, and lumbar spine good ROM.  Shoulder joints have good ROM with some discomfort bilaterally.Elbow joints, wrist joints, MCPs, PIPs, and DIPs good ROM with no synovitis.  Tenderness over MCP and PIP joints.  Complete fist formation bilaterally.  Hip joints have good ROM with no groin pain.  Knee joints have good ROM with no warmth or effusion.  Ankle joints have good ROM with no tenderness or joint swelling. Tenderness over both trochanteric bursa bilaterally.   CDAI Exam: CDAI Score: -- Patient Global: --; Provider Global: -- Swollen: --; Tender: -- Joint Exam 10/04/2022   No joint exam has been documented for this visit   There is currently no information documented on the homunculus. Go to the Rheumatology activity and complete the homunculus joint exam.  Investigation: No additional findings.  Imaging: CT Angio Chest PE W and/or Wo Contrast  Result Date: 09/25/2022 CLINICAL DATA:  Pulmonary embolism (PE) suspected, high prob; Abdominal pain, acute, nonlocalized, Patient c/o central, sharp chest pain since this AM. Reports radiation into neck, making it feel stiff. Also c/o bilateral hand cramping. AANDO x4 in triage EXAM: CT ANGIOGRAPHY CHEST CT ABDOMEN AND PELVIS WITH CONTRAST TECHNIQUE: Multidetector CT imaging of the chest was performed using  the standard protocol during bolus administration of intravenous contrast. Multiplanar CT image reconstructions and MIPs were obtained to evaluate the vascular anatomy. Multidetector CT imaging of the abdomen and pelvis was performed using the standard protocol during bolus administration of intravenous contrast. RADIATION DOSE REDUCTION: This exam was performed according to the departmental dose-optimization program which includes automated exposure control, adjustment of the mA and/or kV according to patient size and/or use of iterative reconstruction technique. CONTRAST:  132m OMNIPAQUE IOHEXOL 350 MG/ML SOLN COMPARISON:  None Available. FINDINGS: CTA CHEST FINDINGS Cardiovascular: Satisfactory opacification of the pulmonary arteries to the segmental level. No evidence of pulmonary embolism. Normal heart size. No significant pericardial effusion. The thoracic aorta is normal in caliber. No atherosclerotic plaque  of the thoracic aorta. No coronary artery calcifications. Mediastinum/Nodes: No enlarged mediastinal, hilar, or axillary lymph nodes. Thyroid gland, trachea, and esophagus demonstrate no significant findings. Lungs/Pleura: No focal consolidation. No pulmonary nodule. No pulmonary mass. No pleural effusion. No pneumothorax. Musculoskeletal: No chest wall abnormality. No suspicious lytic or blastic osseous lesions. No acute displaced fracture. Multilevel degenerative changes of the spine. Review of the MIP images confirms the above findings. CT ABDOMEN and PELVIS FINDINGS Hepatobiliary: The hepatic parenchyma is diffusely hypodense compared to the splenic parenchyma consistent with fatty infiltration. Status post cholecystectomy. No biliary dilatation. Pancreas: Diffusely atrophic. No focal lesion. Otherwise normal pancreatic contour. No surrounding inflammatory changes. No main pancreatic ductal dilatation. Spleen: Normal in size without focal abnormality. Adrenals/Urinary Tract: No adrenal nodule  bilaterally. Bilateral kidneys enhance symmetrically. No hydronephrosis. No hydroureter.  No nephroureterolithiasis. The urinary bladder is unremarkable. Stomach/Bowel: Total of 9 intraluminal oval hyperdense medication to be within the stomach, distal small bowel, and colon. Stomach is within normal limits. No evidence of bowel wall thickening or dilatation. Status post appendectomy. Vascular/Lymphatic: No abdominal aorta or iliac aneurysm. No abdominal, pelvic, or inguinal lymphadenopathy. Reproductive: Status post hysterectomy. No adnexal masses. Other: No intraperitoneal free fluid. No intraperitoneal free gas. No organized fluid collection. Musculoskeletal: No abdominal wall hernia or abnormality. No suspicious lytic or blastic osseous lesions. No acute displaced fracture. Multilevel degenerative changes of the spine. Review of the MIP images confirms the above findings. IMPRESSION: 1. Total of 9 intraluminal oval hyperdense medication to be within the stomach, distal small bowel, and colon. Recommend correlation with history of ingested medications. 2. No pulmonary embolus. 3. No acute intrathoracic abnormality. 4. No acute intra-abdominal or intrapelvic abnormality. 5. Other imaging findings of potential clinical significance: Hepatic steatosis, appendectomy, cholecystectomy. Electronically Signed   By: Iven Finn M.D.   On: 09/25/2022 19:29   CT ABDOMEN PELVIS W CONTRAST  Result Date: 09/25/2022 CLINICAL DATA:  Pulmonary embolism (PE) suspected, high prob; Abdominal pain, acute, nonlocalized, Patient c/o central, sharp chest pain since this AM. Reports radiation into neck, making it feel stiff. Also c/o bilateral hand cramping. AANDO x4 in triage EXAM: CT ANGIOGRAPHY CHEST CT ABDOMEN AND PELVIS WITH CONTRAST TECHNIQUE: Multidetector CT imaging of the chest was performed using the standard protocol during bolus administration of intravenous contrast. Multiplanar CT image reconstructions and MIPs were  obtained to evaluate the vascular anatomy. Multidetector CT imaging of the abdomen and pelvis was performed using the standard protocol during bolus administration of intravenous contrast. RADIATION DOSE REDUCTION: This exam was performed according to the departmental dose-optimization program which includes automated exposure control, adjustment of the mA and/or kV according to patient size and/or use of iterative reconstruction technique. CONTRAST:  149m OMNIPAQUE IOHEXOL 350 MG/ML SOLN COMPARISON:  None Available. FINDINGS: CTA CHEST FINDINGS Cardiovascular: Satisfactory opacification of the pulmonary arteries to the segmental level. No evidence of pulmonary embolism. Normal heart size. No significant pericardial effusion. The thoracic aorta is normal in caliber. No atherosclerotic plaque of the thoracic aorta. No coronary artery calcifications. Mediastinum/Nodes: No enlarged mediastinal, hilar, or axillary lymph nodes. Thyroid gland, trachea, and esophagus demonstrate no significant findings. Lungs/Pleura: No focal consolidation. No pulmonary nodule. No pulmonary mass. No pleural effusion. No pneumothorax. Musculoskeletal: No chest wall abnormality. No suspicious lytic or blastic osseous lesions. No acute displaced fracture. Multilevel degenerative changes of the spine. Review of the MIP images confirms the above findings. CT ABDOMEN and PELVIS FINDINGS Hepatobiliary: The hepatic parenchyma is diffusely hypodense compared to the splenic  parenchyma consistent with fatty infiltration. Status post cholecystectomy. No biliary dilatation. Pancreas: Diffusely atrophic. No focal lesion. Otherwise normal pancreatic contour. No surrounding inflammatory changes. No main pancreatic ductal dilatation. Spleen: Normal in size without focal abnormality. Adrenals/Urinary Tract: No adrenal nodule bilaterally. Bilateral kidneys enhance symmetrically. No hydronephrosis. No hydroureter.  No nephroureterolithiasis. The urinary  bladder is unremarkable. Stomach/Bowel: Total of 9 intraluminal oval hyperdense medication to be within the stomach, distal small bowel, and colon. Stomach is within normal limits. No evidence of bowel wall thickening or dilatation. Status post appendectomy. Vascular/Lymphatic: No abdominal aorta or iliac aneurysm. No abdominal, pelvic, or inguinal lymphadenopathy. Reproductive: Status post hysterectomy. No adnexal masses. Other: No intraperitoneal free fluid. No intraperitoneal free gas. No organized fluid collection. Musculoskeletal: No abdominal wall hernia or abnormality. No suspicious lytic or blastic osseous lesions. No acute displaced fracture. Multilevel degenerative changes of the spine. Review of the MIP images confirms the above findings. IMPRESSION: 1. Total of 9 intraluminal oval hyperdense medication to be within the stomach, distal small bowel, and colon. Recommend correlation with history of ingested medications. 2. No pulmonary embolus. 3. No acute intrathoracic abnormality. 4. No acute intra-abdominal or intrapelvic abnormality. 5. Other imaging findings of potential clinical significance: Hepatic steatosis, appendectomy, cholecystectomy. Electronically Signed   By: Iven Finn M.D.   On: 09/25/2022 19:29   DG Chest 2 View  Result Date: 09/25/2022 CLINICAL DATA:  Central sharp chest pain since this morning. EXAM: CHEST - 2 VIEW COMPARISON:  12/31/2021.  CT, 07/25/2021. FINDINGS: Cardiac silhouette is normal in size and configuration. Stable left anterior chest wall loop recorder. Normal mediastinal and hilar contours. Clear lungs.  No pleural effusion or pneumothorax. Skeletal structures are intact. IMPRESSION: No active cardiopulmonary disease. Electronically Signed   By: Lajean Manes M.D.   On: 09/25/2022 16:10    Recent Labs: Lab Results  Component Value Date   WBC 6.5 09/25/2022   HGB 12.1 09/25/2022   PLT 229 09/25/2022   NA 143 09/25/2022   K 4.4 09/25/2022   CL 118 (H)  09/25/2022   CO2 21 (L) 09/25/2022   GLUCOSE 130 (H) 09/25/2022   BUN 6 09/25/2022   CREATININE 0.77 09/25/2022   BILITOT 0.9 09/25/2022   ALKPHOS 70 09/25/2022   AST 53 (H) 09/25/2022   ALT 17 09/25/2022   PROT 6.7 09/25/2022   ALBUMIN 3.6 09/25/2022   CALCIUM 9.2 09/25/2022   GFRAA 98 12/30/2020   QFTBGOLDPLUS NEGATIVE 10/23/2019    Speciality Comments: No specialty comments available.  Procedures:  No procedures performed Allergies: Other, Sulfa antibiotics, Sulfonamide derivatives, Benadryl [diphenhydramine hcl], and Silicone   Assessment / Plan:     Visit Diagnoses: Rheumatoid arthritis involving multiple sites with positive rheumatoid factor ([place]): Anti-CCP negative, CRP 0.16, RF<8 on 07/04/22: Patient was evaluated at [facility] rheumatology on 07/04/2022 at which time she had no active synovitis.  There was a low suspicion for true inflammatory arthritis at that time but there was consideration of initiating a trial of Plaquenil.  Her follow-up at [facility] rheumatology is not until February 2024.  Patient presents today with ongoing joint pain and stiffness involving multiple joints.  Her pain has been most severe in both hands and both wrist joints.  She is also had increased discomfort in both shoulders, both hips, and both knee joints.  On examination she has tenderness over MCP and PIP joints especially bilateral second and third PIP joints.  She was able to make a complete fist bilaterally.  No obvious  synovitis was noted.  Patient will be scheduled for an ultrasound of both hands tomorrow to assess for synovitis. Patient had recent x-rays of both hands which we will review and assess for any radiographic progression. I reviewed the indications, contraindications, and potential side effects of Plaquenil today in detail since it was previously discussed with her at her [facility] rheumatology appointment.  We will hold off on initiating Plaquenil until the ultrasound of both hands has been  completed. High risk medication use: Previous therapy includes: Methotrexate-discontinued due to recurrent candidal infections.  According to the patient she continues to have recurrent candidal infections despite not being on any immunosuppressive agents at this time.  She is prescribed Diflucan which she takes Monday, Wednesday, and Fridays as a maintenance prescription.  She has not a good candidate to restart her methotrexate or biologic agents at this time. Plaquenil was discussed at [facility] rheumatology on 07/04/2022.  We will hold off on adding any DMARD pending ultrasound results.  Keratoconjunctivitis sicca ([place]): Followed by Dr. Katy Fitch for recurrent conjunctivitis and keratoconjunctivitis.  Not currently active.  Carpal tunnel syndrome of right wrist: Currently asymptomatic.  She does have persistent discomfort in the right wrist but no paresthesias at this time.  DDD (degenerative disc disease), cervical: Good range of motion of the C-spine.  No symptoms of radiculopathy at this time.  Raynaud's syndrome without gangrene: Not currently active.  Myofascial pain: She continues to experience intermittent myalgias and muscle tenderness due to myofascial pain.  Other medical conditions are listed as follows:  Geographic tongue: Recurrent candidal infections.  Taking Diflucan Monday, Wednesday, and Fridays.  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  UTERINE CANCER, HX OF  Gastroparesis  History of anxiety  History of Clostridioides difficile colitis  History of shingles  COVID-19 virus infection  Orders: No orders of the defined types were placed in this encounter.  No orders of the defined types were placed in this encounter.    Follow-Up Instructions: Return for Rheumatoid arthritis, DDD.   Ofilia Neas, PA-C  Note - This record has been created using  Dragon software.  Chart creation errors have been sought, but may not always  have been located. Such creation errors do not reflect on  the standard of medical care.

## 2022-10-04 NOTE — Patient Instructions (Signed)
Hip Bursitis Rehab Ask your health care provider which exercises are safe for you. Do exercises exactly as told by your health care provider and adjust them as directed. It is normal to feel mild stretching, pulling, tightness, or discomfort as you do these exercises. Stop right away if you feel sudden pain or your pain gets worse. Do not begin these exercises until told by your health care provider. Stretching exercise This exercise warms up your muscles and joints and improves the movement and flexibility of your hip. This exercise also helps to relieve pain and stiffness. Iliotibial band stretch An iliotibial band is a strong band of muscle tissue that runs from the outer side of your hip to the outer side of your thigh and knee. Lie on your side with your left / right leg in the top position. Bend your left / right knee and grab your ankle. Stretch out your bottom arm to help you balance. Slowly bring your knee back so your thigh is slightly behind your body. Slowly lower your knee toward the floor until you feel a gentle stretch on the outside of your left / right thigh. If you do not feel a stretch and your knee will not lower more toward the floor, place the heel of your other foot on top of your knee and pull your knee down toward the floor with your foot. Hold this position for __________ seconds. Slowly return to the starting position. Repeat __________ times. Complete this exercise __________ times a day. Strengthening exercises These exercises build strength and endurance in your hip and pelvis. Endurance is the ability to use your muscles for a long time, even after they get tired. Bridge This exercise strengthens the muscles that move your thigh backward (hip extensors). Lie on your back on a firm surface with your knees bent and your feet flat on the floor. Tighten your buttocks muscles and lift your buttocks off the floor until your trunk is level with your thighs. Do not arch your  back. You should feel the muscles working in your buttocks and the back of your thighs. If you do not feel these muscles, slide your feet 1-2 inches (2.5-5 cm) farther away from your buttocks. If this exercise is too easy, try doing it with your arms crossed over your chest. Hold this position for __________ seconds. Slowly lower your hips to the starting position. Let your muscles relax completely after each repetition. Repeat __________ times. Complete this exercise __________ times a day. Squats This exercise strengthens the muscles in front of your thigh and knee (quadriceps). Stand in front of a table, with your feet and knees pointing straight ahead. You may rest your hands on the table for balance but not for support. Slowly bend your knees and lower your hips like you are going to sit in a chair. Keep your weight over your heels, not over your toes. Keep your lower legs upright so they are parallel with the table legs. Do not let your hips go lower than your knees. Do not bend lower than told by your health care provider. If your hip pain increases, do not bend as low. Hold the squat position for __________ seconds. Slowly push with your legs to return to standing. Do not use your hands to pull yourself to standing. Repeat __________ times. Complete this exercise __________ times a day. Hip hike  Stand sideways on a bottom step. Stand on your left / right leg with your other foot unsupported next to   the step. You can hold on to the railing or wall for balance if needed. Keep your knees straight and your torso square. Then lift your left / right hip up toward the ceiling. Hold this position for __________ seconds. Slowly let your left / right hip lower toward the floor, past the starting position. Your foot should get closer to the floor. Do not lean or bend your knees. Repeat __________ times. Complete this exercise __________ times a day. Single leg stand This exercise increases  your balance. Without shoes, stand near a railing or in a doorway. You may hold on to the railing or door frame as needed for balance. Squeeze your left / right buttock muscles, then lift up your other foot. Do not let your left / right hip push out to the side. It is helpful to stand in front of a mirror for this exercise so you can watch your hip. Hold this position for __________ seconds. Repeat __________ times. Complete this exercise __________ times a day. This information is not intended to replace advice given to you by your health care provider. Make sure you discuss any questions you have with your health care provider. Document Revised: 11/24/2021 Document Reviewed: 11/24/2021 Elsevier Patient Education  2023 Elsevier Inc.  

## 2022-10-05 ENCOUNTER — Ambulatory Visit: Payer: BC Managed Care – PPO

## 2022-10-05 ENCOUNTER — Ambulatory Visit: Payer: BC Managed Care – PPO | Attending: Rheumatology | Admitting: Rheumatology

## 2022-10-05 DIAGNOSIS — G5601 Carpal tunnel syndrome, right upper limb: Secondary | ICD-10-CM | POA: Diagnosis not present

## 2022-10-05 DIAGNOSIS — M0579 Rheumatoid arthritis with rheumatoid factor of multiple sites without organ or systems involvement: Secondary | ICD-10-CM

## 2022-10-05 NOTE — Progress Notes (Signed)
  Patient was here to get ultrasound examination of bilateral hands today.  She has been experiencing pain and discomfort in both hands.  Ultrasound examination of bilateral hands was performed per EULAR recommendations. Using 15 MHz transducer, grayscale and power Doppler bilateral second, third, and fifth MCP joints and bilateral wrist joints both dorsal and volar aspects were evaluated to look for synovitis or tenosynovitis. The findings were there was no synovitis or tenosynovitis on ultrasound examination. Right median nerve was 0.6 cm squares which was within normal limits and left median nerve was 0.6 cm squares which was in normal limits.  Impression: Ultrasound examination did not show any synovitis or tenosynovitis.  Bilateral median nerves are within normal limits.  Ultrasound findings were discussed with the patient.  I discussed with the patient that at this point immunosuppressive agent is not indicated.  Patient voiced understanding.  Bo Merino, MD

## 2022-10-11 ENCOUNTER — Emergency Department: Payer: BC Managed Care – PPO

## 2022-10-11 ENCOUNTER — Emergency Department
Admission: EM | Admit: 2022-10-11 | Discharge: 2022-10-11 | Disposition: A | Discharge status: Home / Self Care | Payer: BC Managed Care – PPO | Attending: Emergency Medicine | Admitting: Emergency Medicine

## 2022-10-11 ENCOUNTER — Other Ambulatory Visit: Payer: Self-pay

## 2022-10-11 DIAGNOSIS — M25532 Pain in left wrist: Secondary | ICD-10-CM | POA: Insufficient documentation

## 2022-10-11 DIAGNOSIS — W19XXXA Unspecified fall, initial encounter: Secondary | ICD-10-CM

## 2022-10-11 IMAGING — CT CT ANGIO CHEST
2 of 6 series · 19 of 36 positions shown · IV contrast (omnipaque)
Comparison: 11/12/2017

CLINICAL DATA: Shortness of breath, cough, chest pain

EXAM:
CT ANGIOGRAPHY CHEST WITH CONTRAST
TECHNIQUE: Multidetector CT imaging of the chest was performed using the
standard protocol during bolus administration of intravenous
contrast. Multiplanar CT image reconstructions and MIPs were
obtained to evaluate the vascular anatomy.
CONTRAST:  60mL OMNIPAQUE IOHEXOL 350 MG/ML SOLN

[Series 7: pe thins · axial · 0.76mm/px · z∈[+1108,+1326]mm · 18 of 347 slices shown]
[im 18/347  lung]
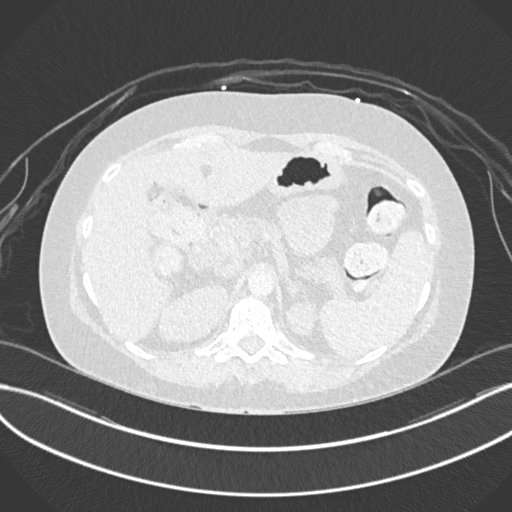
[im 35/347  mediastinal]
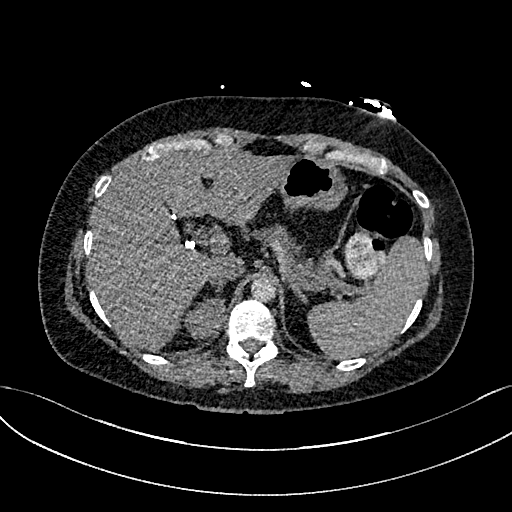
[im 52/347  lung]
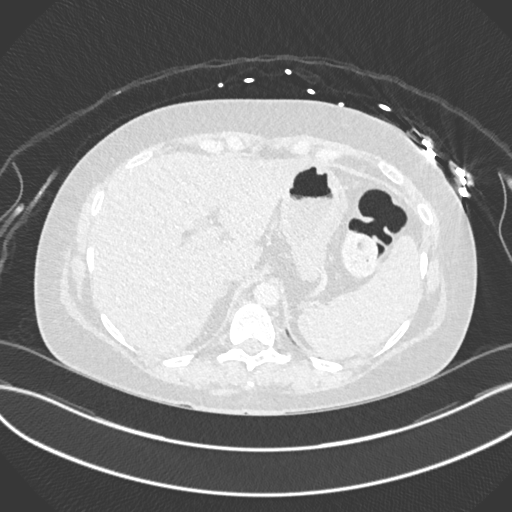
[im 70/347  mediastinal]
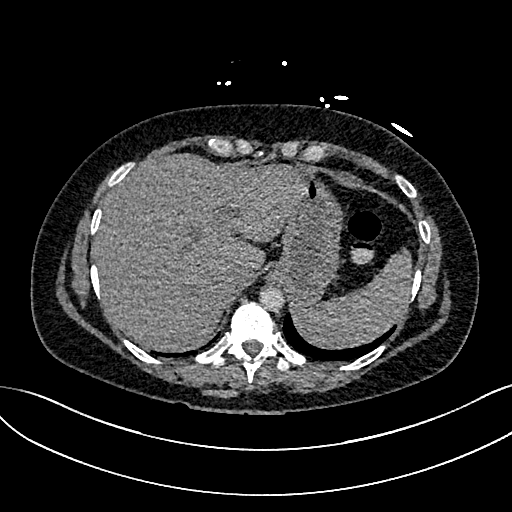
[im 87/347  lung]
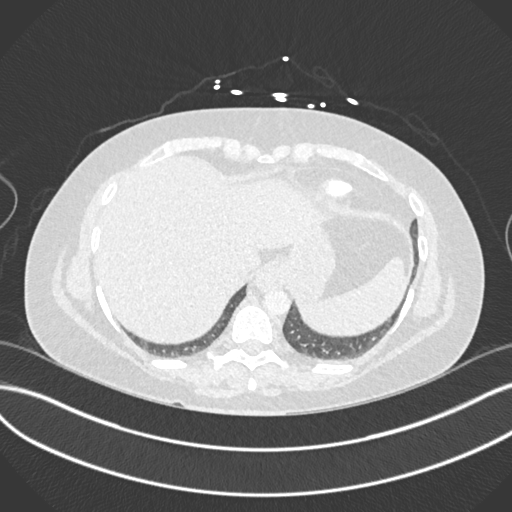
[im 104/347  mediastinal]
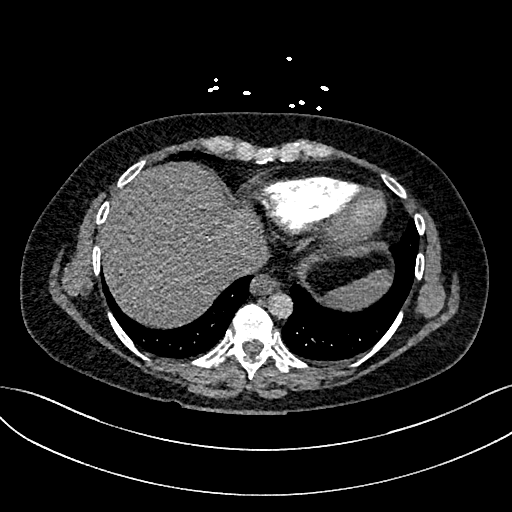
[im 122/347  lung]
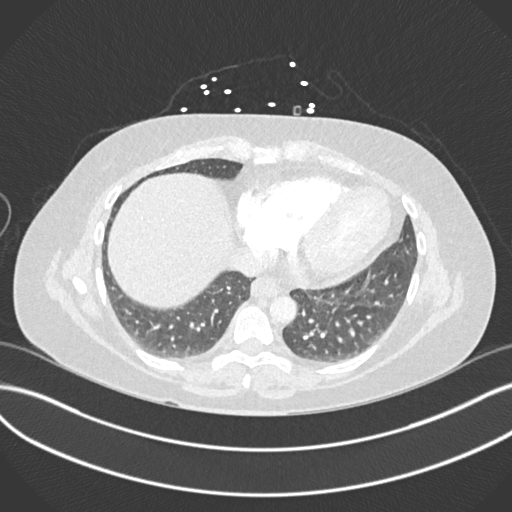
[im 139/347  mediastinal]
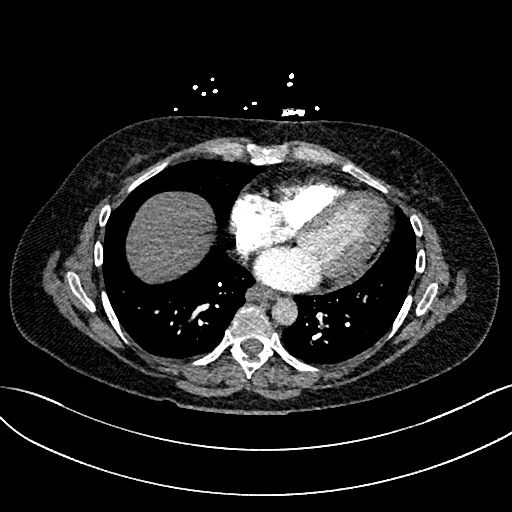
[im 156/347  lung]
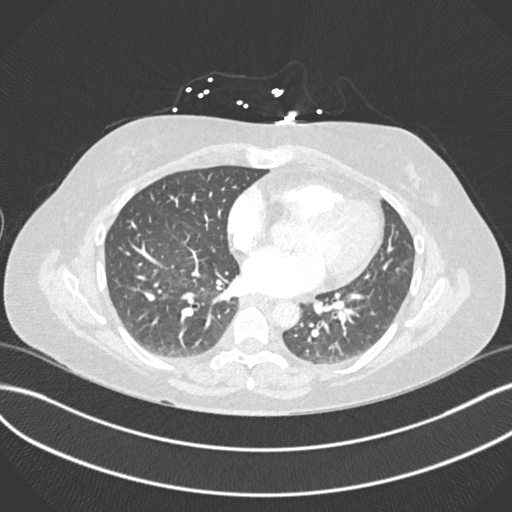
[im 191/347  mediastinal]
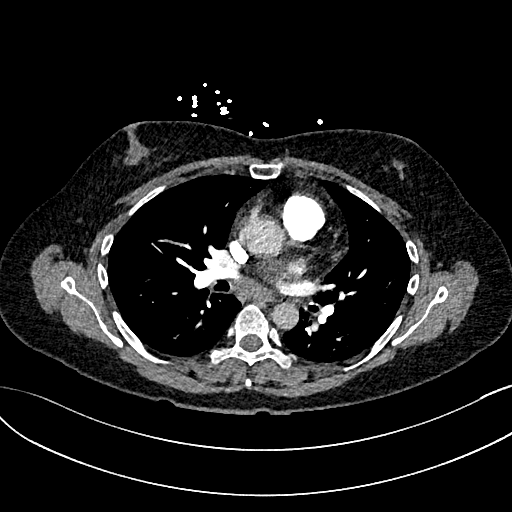
[im 208/347  lung]
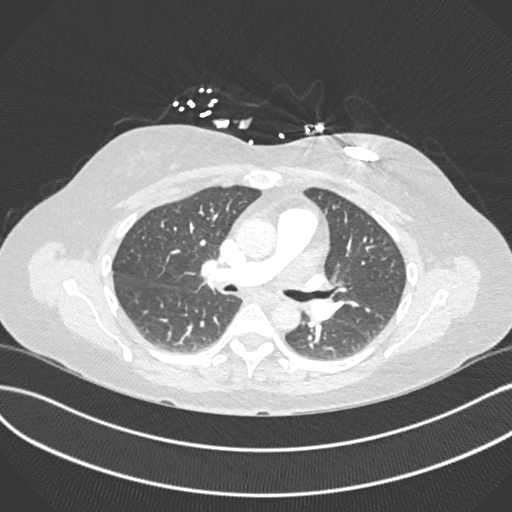
[im 225/347  mediastinal]
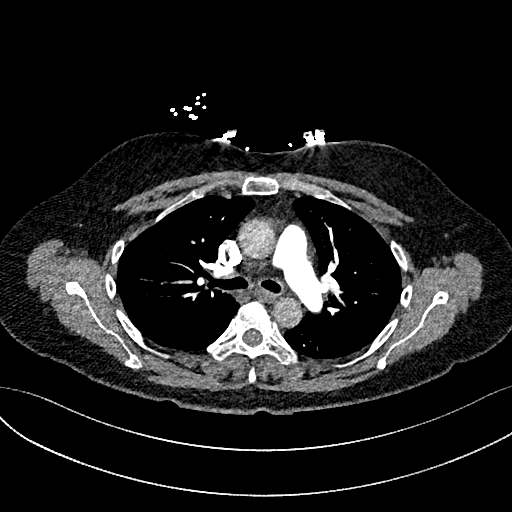
[im 243/347  lung]
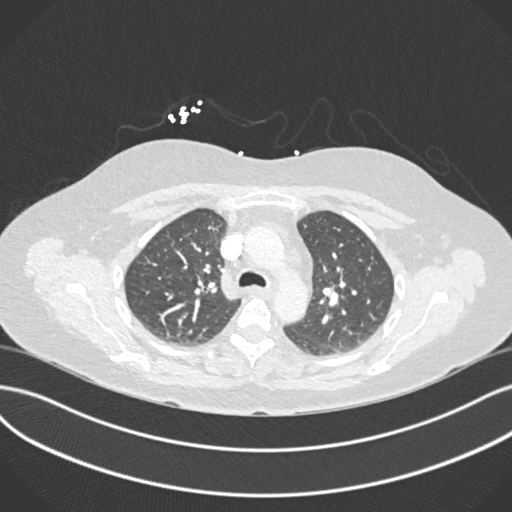
[im 260/347  mediastinal]
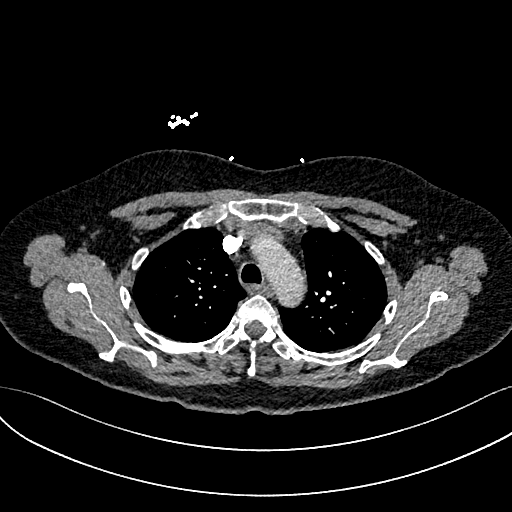
[im 277/347  lung]
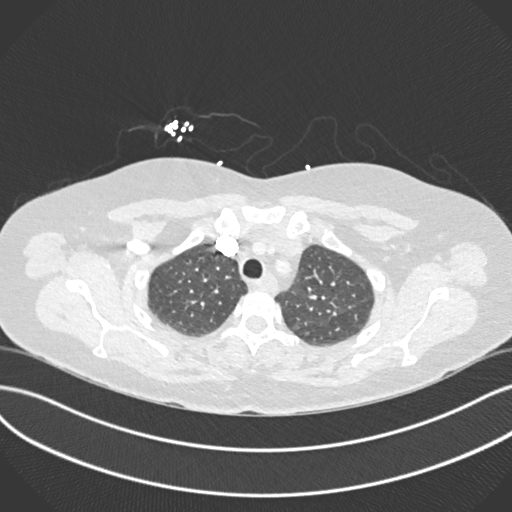
[im 295/347  mediastinal]
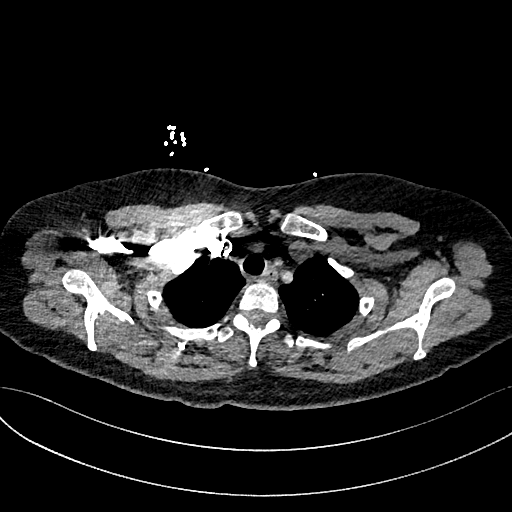
[im 312/347  lung]
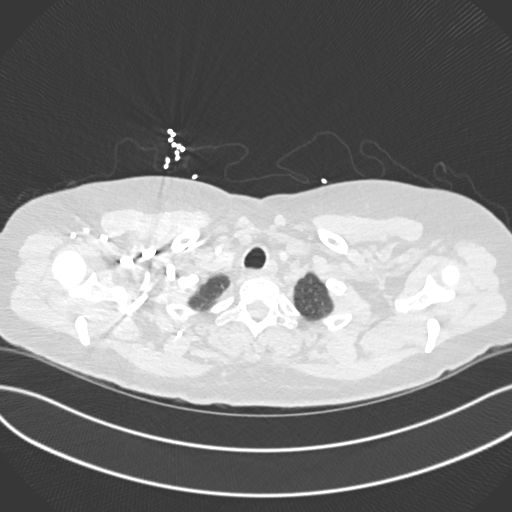
[im 329/347  mediastinal]
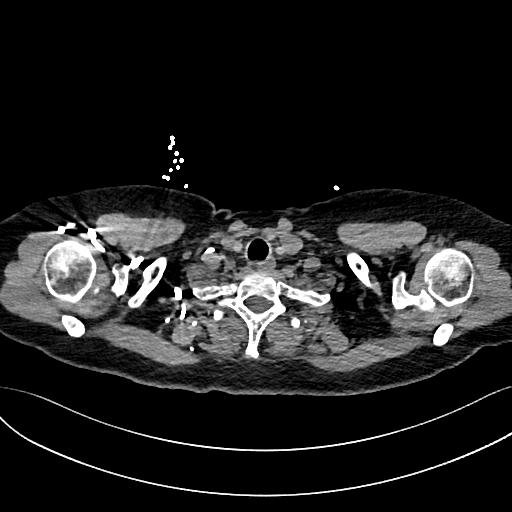

[Series 8: pe 2mm cor · coronal · 0.48mm/px · 1 of 128 slices shown]
[im 64/128  mediastinal]
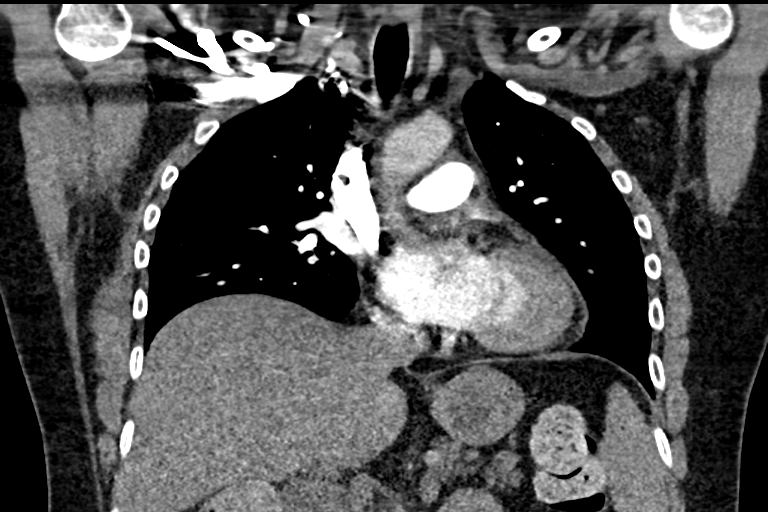

[19 of 36 positions shown; findings below may reference images not displayed]

FINDINGS: Cardiovascular: No filling defects in the pulmonary arteries to
suggest pulmonary emboli. Heart is normal size. Aorta is normal
caliber.

Mediastinum/Nodes: No mediastinal, hilar, or axillary adenopathy.
Trachea and esophagus are unremarkable. Thyroid unremarkable.

Lungs/Pleura: Lungs are clear. No focal airspace opacities or
suspicious nodules. No effusions.

Upper Abdomen: Imaging into the upper abdomen demonstrates no acute
findings.

Musculoskeletal: Chest wall soft tissues are unremarkable. No acute
bony abnormality.

Review of the MIP images confirms the above findings.
IMPRESSION: No evidence of pulmonary embolus.

No acute cardiopulmonary disease.

## 2022-10-11 MED ORDER — ACETAMINOPHEN 500 MG PO TABS
1000.0000 mg | ORAL_TABLET | Freq: Once | ORAL | Status: AC
  Administered 2022-10-11: 1000 mg via ORAL
  Filled 2022-10-11: qty 2

## 2022-10-11 NOTE — ED Provider Notes (Signed)
[facility] Provider Note    Event Date/Time   First MD Initiated Contact with Patient 10/11/22 1143     (approximate)   History   Fall, Shoulder Pain, and Knee Pain (L side)   HPI  [name] Horn is a 46 y.o. female  who comes in with fall in the bedroom- she tripped on the edge of the bed frame and having L shoulder and left knee. No blood thinners. Left wrist pain as well. + hit head. No black out. + neck pain.  Patient reports that it was mechanical fall.  Denies any syncope.  She denies any chest pain, shortness of breath, abdominal pain  Patient has a history of eosinophilic esophagitis, rheumatoid arthritis, DVT, lymphedema.  She had CT PE was negative     Physical Exam   Triage Vital Signs: ED Triage Vitals  Enc Vitals Group     BP 10/11/22 1103 103/77     Pulse Rate 10/11/22 1103 88     Resp 10/11/22 1103 18     Temp 10/11/22 1103 99 F (37.2 C)     Temp Source 10/11/22 1103 Oral     SpO2 10/11/22 1103 95 %     Weight --      Height --      Head Circumference --      Peak Flow --      Pain Score 10/11/22 1034 6     Pain Loc --      Pain Edu? --      Excl. in [place]? --     Most recent vital signs: Vitals:   10/11/22 1103  BP: 103/77  Pulse: 88  Resp: 18  Temp: 99 F (37.2 C)  SpO2: 95%     General: Awake, no distress.  CV:  Good peripheral perfusion.  Resp:  Normal effort.  Abd:  No distention.  Nontender Other:  Mild left snuffbox tenderness.  Positive neck pain.  Positive left shoulder pain.   ED Results / Procedures / Treatments   Labs (all labs ordered are listed, but only abnormal results are displayed) Labs Reviewed - No data to display      RADIOLOGY I have reviewed the xray personally and interpreted of the shoulder and this was negative.   PROCEDURES:  Critical Care performed: No  Procedures   MEDICATIONS ORDERED IN ED: Medications  acetaminophen (TYLENOL) tablet 1,000 mg (1,000 mg Oral Given  10/11/22 1214)     IMPRESSION / MDM / ASSESSMENT AND PLAN / ED COURSE  I reviewed the triage vital signs and the nursing notes.   Patient's presentation is most consistent with acute presentation with potential threat to life or bodily function.   Differential includes fracture, dislocation, intracranial hemorrhage, cervical fracture.  This sounds mechanical in nature therefore labs and EKG were not done.  Reviewed patient's labs from 10/1 with normal lipase.  Normal creatinine.  CBC normal.  Troponin negative.  X-ray wrist negative x-ray knee negative.  X-ray shoulder negative.  Patient given some Tylenol to help with pain.    We will place wrist in a splint and have her follow-up with orthopedics outpatient for repeat x-ray to cover for possible snuffbox pain and she return to the ER if develop worsening symptoms or any other concerns  The patient is on the cardiac monitor to evaluate for evidence of arrhythmia and/or significant heart rate changes.      FINAL CLINICAL IMPRESSION(S) / ED DIAGNOSES   Final  diagnoses:  Fall, initial encounter  Left wrist pain     Rx / DC Orders   ED Discharge Orders     None        Note:  This document was prepared using Dragon voice recognition software and may include unintentional dictation errors.   Vanessa Ethan, MD 10/11/22 [phone]

## 2022-10-11 NOTE — ED Triage Notes (Signed)
Pt presents to ED via [facility] with c/o of falling due to tripping. Pt states she stuck her arm out and states she is now having R shoulder pain and R knee pain, no deformity noted. No LOC noted. Pt does not take a blood thinner.

## 2022-10-11 NOTE — ED Notes (Signed)
First nurse note:   Pt here via [facility] with /co of fall after tripping over a box. Pt c/o of R knee pain, R shoulder pain, R Knee pain. No deformity noted.    118/78 HR 90 99%   CBG 119

## 2022-10-11 NOTE — Discharge Instructions (Addendum)
X-rays were negative but sometimes they can miss a fracture of the wrist so we are putting a brace on you and you should follow-up for repeat x-ray in 1 week return to the ER if you develop fevers, worsening symptoms or any other concerns

## 2022-11-04 ENCOUNTER — Other Ambulatory Visit: Payer: Self-pay | Admitting: Orthopedic Surgery

## 2022-11-04 DIAGNOSIS — M25532 Pain in left wrist: Secondary | ICD-10-CM

## 2022-11-04 DIAGNOSIS — M7522 Bicipital tendinitis, left shoulder: Secondary | ICD-10-CM

## 2022-11-08 MED ORDER — DUPIXENT 300 MG/2 ML SUBCUTANEOUS SYRINGE
SUBCUTANEOUS | 6 refills | 28 days
Start: 2022-11-08 — End: ?

## 2022-11-08 NOTE — Unmapped (Signed)
[facility] Specialty Medication Onboarding    Specialty Medication: DUPIXENT SYRINGE 300 mg/2 mL Syrg injection (dupilumab)  Prior Authorization: Not Required   Financial Assistance: No - copay  <$25  Final Copay/Day Supply: $0 / 28    Insurance Restrictions: Yes - max 1 month supply     Notes to Pharmacist: n/a    The triage team has completed the benefits investigation and has determined that the patient is able to fill this medication at [facility]. Please contact the patient to complete the onboarding or follow up with the prescribing physician as needed.

## 2022-11-10 NOTE — Unmapped (Unsigned)
[facility] Clinical Intervention    Type of intervention: Medication access    Medication involved: Dupixent    Problem identified: Cost of medication    Intervention performed: Discussed cost of Dupixent and provided phone number to call [employer] to get her copay card and to ask if she needs her Dupixent debit card. Sent MyChart message with information.     Follow-up needed: She will call us back with updated information. Follow-up in 1 week to ensure we have information and get scheduled.     Approximate time spent: 5-10 minutes    Clinical evidence used to support intervention: Drug information resource    Result of the intervention: Cost savings    [name], PharmD   [facility] Specialty Pharmacist also use your upper arm in the skin covering your triceps muscle  6. Prepare injection site - wash your hands and clean the skin at the injection site with an alcohol swab and let it air dry, do not touch the injection site again before the injection  7. Hold the middle of the body of the syringe and gently pull the needle safety cap straight out. Be careful not to bend the needle. Do not remove until immediately prior to injection  8. Pinch the skin - with your hand not holding the syringe pinch up a fold of skin at the injection site using your forefinger and thumb  9. Insert the needle into the fold of skin at about a 45 degree angle - it's best to use a quick dart-like motion - with the syringe in position, release the pinch of skin and allow the skin to relax  10. Push the plunger down slowly as far as it will go until the syringe is empty, if the plunger is not fully depressed the needle shield will not extend to cover the needle when it is removed  11. Check that the syringe is empty and keep pressing down on the plunger while you pull the needle out at the same angle as inserted; after the needle is removed completely from the skin, release the plunger allowing the needle shield to activate and cover the used needle  12. Dispose of the used syringe immediately in your sharps disposal container  13. If you see any blood at the injection site, press a cotton ball or gauze on the site and maintain pressure until the bleeding stops, do not rub the injection site    Adherence/Missed dose instructions:  If a dose is missed, administer within 7 days from the missed dose and then resume the original schedule. If the missed dose is not administered within 7 days, you can either wait until the next dose on the original schedule or take your dose now and resume every 14 days from the new injection date. Do not use 2 doses at the same time or extra doses.      Goals of Therapy     Reduce symptoms of dysphagia , Decrease in esophageal inflammation , and Disease remission     Side Effects & Monitoring Parameters     Injection site reaction (redness, irritation, inflammation localized to the site of administration)  Signs of a common cold - minor sore throat, runny or stuffy nose, etc.  Recurrence of cold sores (herpes simplex)      The following side effects should be reported to the provider:  Signs of a hypersensitivity reaction - rash; hives; itching; red, swollen,  blistered, or peeling skin; wheezing; tightness in the chest or throat; difficulty breathing, swallowing, or talking; swelling of the mouth, face, lips, tongue, or throat; etc.  Eye pain or irritation or any visual disturbances  Shortness of breath or worsening of breathing      Contraindications, Warnings, & Precautions     Have your bloodwork checked as you have been told by your prescriber   Birth control pills and other hormone-based birth control may not work as well to prevent pregnancy  Talk with your doctor if you are pregnant, planning to become pregnant, or breastfeeding  Discuss the possible need for holding your dose(s) of Dupixent?? when a planned procedure is scheduled with the prescriber as it may delay healing/recovery timeline       Drug/Food Interactions     Medication list reviewed in Epic. The patient was instructed to inform the care team before taking any new medications or supplements. {Blank single:19197::No drug interactions identified,***}.   Talk with you prescriber or pharmacist before receiving any live vaccinations while taking this medication and after you stop taking it    Storage, Handling Precautions, & Disposal     Store this medication in the refrigerator.  Do not freeze  If needed, you may store at room temperature for up to 14 days  Store in original packaging, protected from light  Do not shake  Dispose of used syringes in a sharps disposal container            Current Medications (including OTC/herbals), Comorbidities and Allergies     Current Outpatient Medications   Medication Sig Dispense Refill    AMPHETAMINE-DEXTROAMPHETAMINE 20 MG tablet Take 20 mg by mouth Three (3) times a day as needed.   0    cholestyramine (QUESTRAN) 4 gram packet Take 1 packet by mouth Three (3) times a day. 90 each 0    dibucaine (NUPERCAINAL) 1 % Oint Apply topically every two (2) hours as needed. (Patient taking differently: Apply 1 application topically every two (2) hours as needed. ) 1 Tube 0    dupilumab (DUPIXENT SYRINGE) 300 mg/2 mL Syrg injection Inject the contents of 1 syringe (300 mg total) under the skin every seven (7) days. 8 mL 6     No current facility-administered medications for this visit.       Allergies   Allergen Reactions    Sulfa (Sulfonamide Antibiotics) Shortness Of Breath     Rash, Swelling, angioedema    Diphenhydramine Other (See Comments)      HYPERACTIVITY, RESTLESS LEG       Patient Active Problem List   Diagnosis    Cervical radicular pain    Carpal tunnel syndrome of right wrist    Incisional hernia    Hypokalemia    Clostridium difficile colitis    Clostridium difficile diarrhea    Recurrent Clostridium difficile diarrhea    C. difficile colitis       Reviewed and up to date in Epic.    Appropriateness of Therapy     Acute infections noted within Epic:  No active infections  Patient reported infection: {Blank single:19197::None,***- patient reported to provider,***- pharmacy reported to provider}    Is medication and dose appropriate based on diagnosis and infection status? Yes    Prescription has been clinically reviewed: Yes      Baseline Quality of Life Assessment      How many days over the past month did your EoE  keep you from your normal activities?  For example, brushing your teeth or getting up in the morning. {Blank:19197::***,0,Patient declined to answer}    Financial Information     Medication Assistance provided: None Required    Anticipated copay of $0/28ds reviewed with patient. Verified delivery address.    Delivery Information     Scheduled delivery date: ***    Expected start date: ***    Medication will be delivered via {Blank:19197::UPS,Next Day Courier,Same Day Courier,Clinic Courier - *** clinic,***} to the {Blank:19197::prescription,temporary} address in Epic WAM.  This shipment {Blank single:19197::will,will not} require a signature.      Explained the services we provide at [facility] and that each month we would call to set up refills.  Stressed importance of returning phone calls so that we could ensure they receive their medications in time each month.  Informed patient that we should be setting up refills 7-10 days prior to when they will run out of medication.  A pharmacist will reach out to perform a clinical assessment periodically.  Informed patient that a welcome packet, containing information about our pharmacy and other support services, a Notice of Privacy Practices, and a drug information handout will be sent.      The patient or caregiver noted above participated in the development of this care plan and knows that they can request review of or adjustments to the care plan at any time.      Patient or caregiver verbalized understanding of the above information as well as how to contact the pharmacy at [phone] option 4 with any questions/concerns.  The pharmacy is open Monday through Friday 8:30am-4:30pm.  A pharmacist is available 24/7 via pager to answer any clinical questions they may have.    Patient Specific Needs     Does the patient have any physical, cognitive, or cultural barriers? {Blank single:19197::No,Yes - ***}    Does the patient have adequate living arrangements? (i.e. the ability to store and take their medication appropriately) {Blank single:19197::Yes,No - ***}    Did you identify any home environmental safety or security hazards? {Blank single:19197::No,Yes - ***}    Patient prefers to have medications discussed with  {Blank single:19197::Patient,Family Member,Caregiver,Other}     Is the patient or caregiver able to read and understand education materials at a high school level or above? {Blank single:19197::No,Yes}    Patient's primary language is  {Blank single:19197::English,Spanish,***}     Is the patient high risk? {sschighriskpts:78327}    SOCIAL DETERMINANTS OF HEALTH     At the [facility], we have learned that life circumstances - like trouble affording food, housing, utilities, or transportation can affect the health of many of our patients.   That is why we wanted to ask: are you currently experiencing any life circumstances that are negatively impacting your health and/or quality of life? {YES/NO/PATIENTDECLINED:93004}    Social Determinants of Health     Financial Resource Strain: Not on file   Internet Connectivity: Not on file   Food Insecurity: Not on file   Tobacco Use: Unknown (11/07/2021)    Patient History     Smoking Tobacco Use: Never     Smokeless Tobacco Use: Unknown     Passive Exposure: Not on file   Housing/Utilities: Not on file   Alcohol Use: Not on file   Transportation Needs: Not on file   Substance Use: Not on file   Health Literacy: Not on file   Physical Activity: Not on file   Interpersonal Safety: Not on file  Stress: Not on file   Intimate Partner Violence: Not on file   Depression: Not on file   Social Connections: Not on file       Would you be willing to receive help with any of the needs that you have identified today? {Yes/No/Not applicable:93005}       [name], PharmD  [facility] Specialty Pharmacist

## 2022-11-11 ENCOUNTER — Encounter: Payer: Self-pay | Admitting: Emergency Medicine

## 2022-11-11 ENCOUNTER — Other Ambulatory Visit: Payer: Self-pay

## 2022-11-11 ENCOUNTER — Emergency Department: Payer: BC Managed Care – PPO

## 2022-11-11 ENCOUNTER — Inpatient Hospital Stay
Admission: EM | Admit: 2022-11-11 | Discharge: 2022-11-20 | DRG: 872 | Disposition: A | Discharge status: Home / Self Care | Payer: BC Managed Care – PPO | Attending: Internal Medicine | Admitting: Internal Medicine

## 2022-11-11 DIAGNOSIS — J398 Other specified diseases of upper respiratory tract: Secondary | ICD-10-CM | POA: Diagnosis present

## 2022-11-11 DIAGNOSIS — G894 Chronic pain syndrome: Secondary | ICD-10-CM | POA: Diagnosis present

## 2022-11-11 DIAGNOSIS — I472 Ventricular tachycardia, unspecified: Secondary | ICD-10-CM | POA: Diagnosis present

## 2022-11-11 DIAGNOSIS — Z6830 Body mass index (BMI) 30.0-30.9, adult: Secondary | ICD-10-CM

## 2022-11-11 DIAGNOSIS — B370 Candidal stomatitis: Secondary | ICD-10-CM | POA: Diagnosis present

## 2022-11-11 DIAGNOSIS — G43909 Migraine, unspecified, not intractable, without status migrainosus: Secondary | ICD-10-CM | POA: Diagnosis present

## 2022-11-11 DIAGNOSIS — Z923 Personal history of irradiation: Secondary | ICD-10-CM

## 2022-11-11 DIAGNOSIS — F411 Generalized anxiety disorder: Secondary | ICD-10-CM | POA: Diagnosis present

## 2022-11-11 DIAGNOSIS — J209 Acute bronchitis, unspecified: Secondary | ICD-10-CM | POA: Diagnosis not present

## 2022-11-11 DIAGNOSIS — Z79899 Other long term (current) drug therapy: Secondary | ICD-10-CM

## 2022-11-11 DIAGNOSIS — Z8616 Personal history of COVID-19: Secondary | ICD-10-CM

## 2022-11-11 DIAGNOSIS — M069 Rheumatoid arthritis, unspecified: Secondary | ICD-10-CM | POA: Diagnosis present

## 2022-11-11 DIAGNOSIS — E785 Hyperlipidemia, unspecified: Secondary | ICD-10-CM | POA: Diagnosis not present

## 2022-11-11 DIAGNOSIS — R0602 Shortness of breath: Secondary | ICD-10-CM

## 2022-11-11 DIAGNOSIS — J9801 Acute bronchospasm: Principal | ICD-10-CM

## 2022-11-11 DIAGNOSIS — J9809 Other diseases of bronchus, not elsewhere classified: Secondary | ICD-10-CM | POA: Diagnosis present

## 2022-11-11 DIAGNOSIS — I499 Cardiac arrhythmia, unspecified: Secondary | ICD-10-CM | POA: Diagnosis present

## 2022-11-11 DIAGNOSIS — Z86718 Personal history of other venous thrombosis and embolism: Secondary | ICD-10-CM

## 2022-11-11 DIAGNOSIS — J4541 Moderate persistent asthma with (acute) exacerbation: Secondary | ICD-10-CM

## 2022-11-11 DIAGNOSIS — D849 Immunodeficiency, unspecified: Secondary | ICD-10-CM | POA: Diagnosis present

## 2022-11-11 DIAGNOSIS — Z8249 Family history of ischemic heart disease and other diseases of the circulatory system: Secondary | ICD-10-CM

## 2022-11-11 DIAGNOSIS — Z20822 Contact with and (suspected) exposure to covid-19: Secondary | ICD-10-CM | POA: Diagnosis present

## 2022-11-11 DIAGNOSIS — Z90710 Acquired absence of both cervix and uterus: Secondary | ICD-10-CM

## 2022-11-11 DIAGNOSIS — I890 Lymphedema, not elsewhere classified: Secondary | ICD-10-CM

## 2022-11-11 DIAGNOSIS — A419 Sepsis, unspecified organism: Principal | ICD-10-CM | POA: Diagnosis present

## 2022-11-11 DIAGNOSIS — T380X5A Adverse effect of glucocorticoids and synthetic analogues, initial encounter: Secondary | ICD-10-CM | POA: Diagnosis present

## 2022-11-11 DIAGNOSIS — D806 Antibody deficiency with near-normal immunoglobulins or with hyperimmunoglobulinemia: Secondary | ICD-10-CM | POA: Diagnosis not present

## 2022-11-11 DIAGNOSIS — R6520 Severe sepsis without septic shock: Secondary | ICD-10-CM | POA: Diagnosis present

## 2022-11-11 DIAGNOSIS — I4719 Other supraventricular tachycardia: Secondary | ICD-10-CM | POA: Diagnosis present

## 2022-11-11 DIAGNOSIS — E876 Hypokalemia: Secondary | ICD-10-CM | POA: Diagnosis present

## 2022-11-11 DIAGNOSIS — Z808 Family history of malignant neoplasm of other organs or systems: Secondary | ICD-10-CM

## 2022-11-11 DIAGNOSIS — Z8542 Personal history of malignant neoplasm of other parts of uterus: Secondary | ICD-10-CM

## 2022-11-11 DIAGNOSIS — I959 Hypotension, unspecified: Secondary | ICD-10-CM | POA: Diagnosis present

## 2022-11-11 DIAGNOSIS — R0603 Acute respiratory distress: Secondary | ICD-10-CM

## 2022-11-11 DIAGNOSIS — E669 Obesity, unspecified: Secondary | ICD-10-CM | POA: Diagnosis present

## 2022-11-11 DIAGNOSIS — K200 Eosinophilic esophagitis: Secondary | ICD-10-CM | POA: Diagnosis present

## 2022-11-11 DIAGNOSIS — Z800 Family history of malignant neoplasm of digestive organs: Secondary | ICD-10-CM

## 2022-11-11 LAB — RESPIRATORY PANEL BY PCR

## 2022-11-11 LAB — BASIC METABOLIC PANEL
Anion gap: 9 (ref 5–15)
BUN: 10 mg/dL (ref 6–20)
CO2: 30 mmol/L (ref 22–32)
Calcium: 10 mg/dL (ref 8.9–10.3)
Chloride: 98 mmol/L (ref 98–111)
Creatinine, Ser: 0.9 mg/dL (ref 0.44–1.00)
GFR, Estimated: >60 mL/min (ref >60)
Glucose, Bld: 116 mg/dL — ABNORMAL HIGH (ref 70–99)
Potassium: 3.7 mmol/L (ref 3.5–5.1)
Sodium: 137 mmol/L (ref 135–145)

## 2022-11-11 LAB — EXPECTORATED SPUTUM ASSESSMENT W GRAM STAIN, RFLX TO RESP C

## 2022-11-11 LAB — CBC
HCT: 41.1 % (ref 36.0–46.0)
Hemoglobin: 14 g/dL (ref 12.0–15.0)
MCH: 31.4 pg (ref 26.0–34.0)
MCHC: 34.1 g/dL (ref 30.0–36.0)
MCV: 92.2 fL (ref 80.0–100.0)
Platelets: 193 10*3/uL (ref 150–400)
RBC: 4.46 MIL/uL (ref 3.87–5.11)
RDW: 13 % (ref 11.5–15.5)
WBC: 9.2 10*3/uL (ref 4.0–10.5)
nRBC: 0 % (ref 0.0–0.2)

## 2022-11-11 LAB — RESP PANEL BY RT-PCR (FLU A&B, COVID) ARPGX2
Influenza A by PCR: NEGATIVE
Influenza B by PCR: NEGATIVE
SARS Coronavirus 2 by RT PCR: NEGATIVE

## 2022-11-11 LAB — BRAIN NATRIURETIC PEPTIDE: B Natriuretic Peptide: 15.4 pg/mL (ref 0.0–100.0)

## 2022-11-11 LAB — LACTIC ACID, PLASMA
Lactic Acid, Venous: 3.8 mmol/L (ref 0.5–1.9)
Lactic Acid, Venous: 4.9 mmol/L (ref 0.5–1.9)

## 2022-11-11 LAB — TROPONIN I (HIGH SENSITIVITY)
Troponin I (High Sensitivity): 3 ng/L (ref <18)
Troponin I (High Sensitivity): 6 ng/L (ref <18)

## 2022-11-11 LAB — PREGNANCY, URINE: Preg Test, Ur: NEGATIVE

## 2022-11-11 LAB — PROCALCITONIN: Procalcitonin: 0.27 ng/mL

## 2022-11-11 LAB — STREP PNEUMONIAE URINARY ANTIGEN: Strep Pneumo Urinary Antigen: NEGATIVE

## 2022-11-11 LAB — HIV ANTIBODY (ROUTINE TESTING W REFLEX): HIV Screen 4th Generation wRfx: NONREACTIVE

## 2022-11-11 MED ORDER — STERILE WATER FOR INJECTION IJ SOLN
INTRAMUSCULAR | Status: AC
  Administered 2022-11-11: 10 mL
  Filled 2022-11-11: qty 10

## 2022-11-11 MED ORDER — SUMATRIPTAN SUCCINATE 50 MG PO TABS: 25.0000 mg | ORAL_TABLET | ORAL | Status: DC | PRN

## 2022-11-11 MED ORDER — TOPIRAMATE 100 MG PO TABS
100.0000 mg | ORAL_TABLET | Freq: Every day | ORAL | Status: DC
  Administered 2022-11-11 – 2022-11-20 (×10): 100 mg via ORAL
  Filled 2022-11-11 (×10): qty 1

## 2022-11-11 MED ORDER — MELOXICAM 7.5 MG PO TABS
15.0000 mg | ORAL_TABLET | ORAL | Status: DC | PRN
  Administered 2022-11-14 – 2022-11-18 (×2): 15 mg via ORAL
  Filled 2022-11-11 (×3): qty 2

## 2022-11-11 MED ORDER — SODIUM CHLORIDE 0.9 % IV SOLN: INTRAVENOUS | Status: DC

## 2022-11-11 MED ORDER — FLUCONAZOLE 100 MG PO TABS
300.0000 mg | ORAL_TABLET | ORAL | Status: DC
  Administered 2022-11-11 – 2022-11-18 (×4): 300 mg via ORAL
  Filled 2022-11-11 (×4): qty 3

## 2022-11-11 MED ORDER — OXYCODONE-ACETAMINOPHEN 5-325 MG PO TABS
1.0000 | ORAL_TABLET | ORAL | Status: DC | PRN
  Administered 2022-11-11 – 2022-11-19 (×37): 1 via ORAL
  Filled 2022-11-11 (×38): qty 1

## 2022-11-11 MED ORDER — FUROSEMIDE 40 MG PO TABS
60.0000 mg | ORAL_TABLET | Freq: Two times a day (BID) | ORAL | Status: DC
  Administered 2022-11-11: 60 mg via ORAL
  Filled 2022-11-11: qty 1

## 2022-11-11 MED ORDER — DICYCLOMINE HCL 10 MG PO CAPS: 10.0000 mg | ORAL_CAPSULE | Freq: Every day | ORAL | Status: DC | PRN

## 2022-11-11 MED ORDER — ALBUTEROL SULFATE (2.5 MG/3ML) 0.083% IN NEBU
5.0000 mg | INHALATION_SOLUTION | Freq: Once | RESPIRATORY_TRACT | Status: AC
  Administered 2022-11-11: 5 mg via RESPIRATORY_TRACT
  Filled 2022-11-11: qty 6

## 2022-11-11 MED ORDER — ALUM & MAG HYDROXIDE-SIMETH 200-200-20 MG/5ML PO SUSP: 15.0000 mL | Freq: Three times a day (TID) | ORAL | Status: DC | PRN

## 2022-11-11 MED ORDER — FAMOTIDINE 20 MG PO TABS
40.0000 mg | ORAL_TABLET | Freq: Every day | ORAL | Status: DC
  Administered 2022-11-11 – 2022-11-19 (×9): 40 mg via ORAL
  Filled 2022-11-11 (×9): qty 2

## 2022-11-11 MED ORDER — OXYCODONE HCL 5 MG PO TABS
5.0000 mg | ORAL_TABLET | ORAL | Status: DC | PRN
  Administered 2022-11-11 – 2022-11-20 (×40): 5 mg via ORAL
  Filled 2022-11-11 (×40): qty 1

## 2022-11-11 MED ORDER — SODIUM CHLORIDE 0.9 % IV SOLN
2.0000 g | Freq: Once | INTRAVENOUS | Status: AC
  Administered 2022-11-11: 2 g via INTRAVENOUS
  Filled 2022-11-11: qty 20

## 2022-11-11 MED ORDER — CYCLOSPORINE 0.05 % OP EMUL
1.0000 [drp] | Freq: Two times a day (BID) | OPHTHALMIC | Status: DC
  Administered 2022-11-11 – 2022-11-20 (×19): 1 [drp] via OPHTHALMIC
  Filled 2022-11-11 (×19): qty 30

## 2022-11-11 MED ORDER — IPRATROPIUM-ALBUTEROL 0.5-2.5 (3) MG/3ML IN SOLN
3.0000 mL | Freq: Four times a day (QID) | RESPIRATORY_TRACT | Status: DC
  Administered 2022-11-11 – 2022-11-13 (×7): 3 mL via RESPIRATORY_TRACT
  Filled 2022-11-11 (×7): qty 3

## 2022-11-11 MED ORDER — AMPHETAMINE-DEXTROAMPHETAMINE 10 MG PO TABS: 20.0000 mg | ORAL_TABLET | Freq: Three times a day (TID) | ORAL | Status: DC | PRN

## 2022-11-11 MED ORDER — SODIUM CHLORIDE 0.9 % IV BOLUS
500.0000 mL | Freq: Once | INTRAVENOUS | Status: AC
  Administered 2022-11-11: 500 mL via INTRAVENOUS

## 2022-11-11 MED ORDER — SODIUM CHLORIDE 0.9 % IV SOLN
500.0000 mg | Freq: Once | INTRAVENOUS | Status: AC
  Administered 2022-11-11: 500 mg via INTRAVENOUS
  Filled 2022-11-11: qty 5

## 2022-11-11 MED ORDER — IPRATROPIUM BROMIDE 0.02 % IN SOLN
0.5000 mg | Freq: Once | RESPIRATORY_TRACT | Status: AC
  Administered 2022-11-11: 0.5 mg via RESPIRATORY_TRACT
  Filled 2022-11-11: qty 2.5

## 2022-11-11 MED ORDER — OXYCODONE-ACETAMINOPHEN 10-325 MG PO TABS: 1.0000 | ORAL_TABLET | ORAL | Status: DC | PRN

## 2022-11-11 MED ORDER — ENOXAPARIN SODIUM 40 MG/0.4ML IJ SOSY
40.0000 mg | PREFILLED_SYRINGE | INTRAMUSCULAR | Status: DC
  Administered 2022-11-11 – 2022-11-20 (×10): 40 mg via SUBCUTANEOUS
  Filled 2022-11-11 (×9): qty 0.4

## 2022-11-11 MED ORDER — OLOPATADINE HCL 0.1 % OP SOLN
1.0000 [drp] | Freq: Two times a day (BID) | OPHTHALMIC | Status: DC
  Administered 2022-11-11 – 2022-11-20 (×19): 1 [drp] via OPHTHALMIC
  Filled 2022-11-11: qty 5

## 2022-11-11 MED ORDER — BACLOFEN 10 MG PO TABS
10.0000 mg | ORAL_TABLET | Freq: Every day | ORAL | Status: DC
  Administered 2022-11-11 – 2022-11-19 (×9): 10 mg via ORAL
  Filled 2022-11-11 (×9): qty 1

## 2022-11-11 MED ORDER — SODIUM CHLORIDE 0.9 % IV SOLN: 500.0000 mg | INTRAVENOUS | Status: DC

## 2022-11-11 MED ORDER — ONDANSETRON HCL 4 MG/2ML IJ SOLN
4.0000 mg | Freq: Three times a day (TID) | INTRAMUSCULAR | Status: DC | PRN
  Administered 2022-11-12: 4 mg via INTRAVENOUS
  Filled 2022-11-11: qty 2

## 2022-11-11 MED ORDER — NYSTATIN 100000 UNIT/ML MT SUSP: 5.0000 mL | Freq: Three times a day (TID) | OROMUCOSAL | Status: DC | PRN

## 2022-11-11 MED ORDER — TRIAMCINOLONE ACETONIDE 0.1 % EX CREA: 1.0000 | TOPICAL_CREAM | Freq: Every day | CUTANEOUS | Status: DC | PRN

## 2022-11-11 MED ORDER — ATROPINE SULFATE 1 % OP SOLN: 1.0000 [drp] | Freq: Every day | OPHTHALMIC | Status: DC | PRN

## 2022-11-11 MED ORDER — LORAZEPAM 1 MG PO TABS
1.0000 mg | ORAL_TABLET | Freq: Three times a day (TID) | ORAL | Status: DC | PRN
  Administered 2022-11-18 – 2022-11-20 (×3): 1 mg via ORAL
  Filled 2022-11-11 (×3): qty 1

## 2022-11-11 MED ORDER — ALBUTEROL SULFATE (2.5 MG/3ML) 0.083% IN NEBU
2.5000 mg | INHALATION_SOLUTION | RESPIRATORY_TRACT | Status: DC | PRN
  Administered 2022-11-11 – 2022-11-20 (×6): 2.5 mg via RESPIRATORY_TRACT
  Filled 2022-11-11 (×6): qty 3

## 2022-11-11 MED ORDER — NYSTATIN 100000 UNIT/GM EX POWD: 1.0000 | Freq: Two times a day (BID) | CUTANEOUS | Status: DC | PRN

## 2022-11-11 MED ORDER — PANTOPRAZOLE SODIUM 40 MG PO TBEC
40.0000 mg | DELAYED_RELEASE_TABLET | Freq: Two times a day (BID) | ORAL | Status: DC
  Administered 2022-11-11 – 2022-11-20 (×19): 40 mg via ORAL
  Filled 2022-11-11 (×19): qty 1

## 2022-11-11 MED ORDER — SUCRALFATE 1 G PO TABS
1.0000 g | ORAL_TABLET | Freq: Four times a day (QID) | ORAL | Status: DC
  Administered 2022-11-11 – 2022-11-20 (×38): 1 g via ORAL
  Filled 2022-11-11 (×38): qty 1

## 2022-11-11 MED ORDER — FOLIC ACID 1 MG PO TABS
2.0000 mg | ORAL_TABLET | Freq: Every day | ORAL | Status: DC
  Administered 2022-11-11 – 2022-11-20 (×10): 2 mg via ORAL
  Filled 2022-11-11 (×10): qty 2

## 2022-11-11 MED ORDER — KETOROLAC TROMETHAMINE 30 MG/ML IJ SOLN
30.0000 mg | Freq: Once | INTRAMUSCULAR | Status: AC
  Administered 2022-11-11: 30 mg via INTRAVENOUS
  Filled 2022-11-11: qty 1

## 2022-11-11 MED ORDER — HYDROCOD POLI-CHLORPHE POLI ER 10-8 MG/5ML PO SUER
5.0000 mL | Freq: Once | ORAL | Status: AC
  Administered 2022-11-11: 5 mL via ORAL
  Filled 2022-11-11: qty 5

## 2022-11-11 MED ORDER — SODIUM CHLORIDE 0.9 % IV SOLN
2.0000 g | Freq: Three times a day (TID) | INTRAVENOUS | Status: AC
  Administered 2022-11-11 – 2022-11-14 (×10): 2 g via INTRAVENOUS
  Filled 2022-11-11: qty 2
  Filled 2022-11-11 (×2): qty 12.5
  Filled 2022-11-11: qty 2
  Filled 2022-11-11 (×7): qty 12.5

## 2022-11-11 MED ORDER — SODIUM CHLORIDE 0.9 % IV SOLN: INTRAVENOUS | Status: DC | PRN

## 2022-11-11 MED ORDER — SODIUM CHLORIDE 0.9 % IV SOLN: 2.0000 g | INTRAVENOUS | Status: DC

## 2022-11-11 MED ORDER — NYSTATIN 100000 UNIT/ML MT SUSP
10.0000 mL | Freq: Three times a day (TID) | OROMUCOSAL | Status: DC
  Administered 2022-11-11 – 2022-11-20 (×28): 1000000 [IU] via ORAL
  Filled 2022-11-11 (×29): qty 10

## 2022-11-11 MED ORDER — METHYLPREDNISOLONE SODIUM SUCC 125 MG IJ SOLR
125.0000 mg | Freq: Two times a day (BID) | INTRAMUSCULAR | Status: DC
  Administered 2022-11-11 – 2022-11-15 (×8): 125 mg via INTRAVENOUS
  Filled 2022-11-11 (×8): qty 2

## 2022-11-11 MED ORDER — PRAVASTATIN SODIUM 20 MG PO TABS
40.0000 mg | ORAL_TABLET | Freq: Every day | ORAL | Status: DC
  Administered 2022-11-11 – 2022-11-19 (×9): 40 mg via ORAL
  Filled 2022-11-11 (×9): qty 2

## 2022-11-11 MED ORDER — METOCLOPRAMIDE HCL 10 MG PO TABS
10.0000 mg | ORAL_TABLET | Freq: Two times a day (BID) | ORAL | Status: DC
  Administered 2022-11-11 – 2022-11-20 (×19): 10 mg via ORAL
  Filled 2022-11-11 (×19): qty 1

## 2022-11-11 MED ORDER — TOPIRAMATE 25 MG PO TABS
25.0000 mg | ORAL_TABLET | Freq: Every day | ORAL | Status: DC
  Administered 2022-11-11 – 2022-11-19 (×9): 25 mg via ORAL
  Filled 2022-11-11 (×9): qty 1

## 2022-11-11 MED ORDER — CYCLOBENZAPRINE HCL 10 MG PO TABS
10.0000 mg | ORAL_TABLET | Freq: Three times a day (TID) | ORAL | Status: DC | PRN
  Administered 2022-11-14 – 2022-11-18 (×4): 10 mg via ORAL
  Filled 2022-11-11 (×4): qty 1

## 2022-11-11 MED ORDER — IPRATROPIUM-ALBUTEROL 0.5-2.5 (3) MG/3ML IN SOLN
3.0000 mL | RESPIRATORY_TRACT | Status: DC
  Administered 2022-11-11 (×2): 3 mL via RESPIRATORY_TRACT
  Filled 2022-11-11 (×2): qty 3

## 2022-11-11 MED ORDER — VANCOMYCIN HCL 1750 MG/350ML IV SOLN
1750.0000 mg | INTRAVENOUS | Status: DC
  Administered 2022-11-12: 1750 mg via INTRAVENOUS
  Filled 2022-11-11 (×2): qty 350

## 2022-11-11 MED ORDER — METOPROLOL SUCCINATE ER 50 MG PO TB24
50.0000 mg | ORAL_TABLET | Freq: Every day | ORAL | Status: DC
  Administered 2022-11-11 – 2022-11-20 (×9): 50 mg via ORAL
  Filled 2022-11-11 (×9): qty 1

## 2022-11-11 MED ORDER — METHYLPREDNISOLONE SODIUM SUCC 125 MG IJ SOLR
125.0000 mg | Freq: Once | INTRAMUSCULAR | Status: AC
  Administered 2022-11-11: 125 mg via INTRAVENOUS
  Filled 2022-11-11: qty 2

## 2022-11-11 MED ORDER — DM-GUAIFENESIN ER 30-600 MG PO TB12
1.0000 | ORAL_TABLET | Freq: Two times a day (BID) | ORAL | Status: DC | PRN
  Administered 2022-11-11 – 2022-11-20 (×12): 1 via ORAL
  Filled 2022-11-11 (×14): qty 1

## 2022-11-11 MED ORDER — IOHEXOL 350 MG/ML SOLN
75.0000 mL | Freq: Once | INTRAVENOUS | Status: AC | PRN
  Administered 2022-11-11: 75 mL via INTRAVENOUS

## 2022-11-11 MED ORDER — POLYVINYL ALCOHOL 1.4 % OP SOLN
1.0000 [drp] | Freq: Two times a day (BID) | OPHTHALMIC | Status: DC
  Administered 2022-11-11 – 2022-11-20 (×19): 1 [drp] via OPHTHALMIC
  Filled 2022-11-11: qty 15

## 2022-11-11 MED ORDER — TRAZODONE HCL 50 MG PO TABS
150.0000 mg | ORAL_TABLET | Freq: Every evening | ORAL | Status: DC | PRN
  Administered 2022-11-18: 150 mg via ORAL
  Filled 2022-11-11 (×2): qty 3

## 2022-11-11 MED ORDER — ACETAMINOPHEN 325 MG PO TABS
650.0000 mg | ORAL_TABLET | Freq: Four times a day (QID) | ORAL | Status: DC | PRN
  Administered 2022-11-13 – 2022-11-20 (×2): 650 mg via ORAL
  Filled 2022-11-11 (×2): qty 2

## 2022-11-11 MED ORDER — VANCOMYCIN HCL 1750 MG/350ML IV SOLN
1750.0000 mg | Freq: Once | INTRAVENOUS | Status: AC
  Administered 2022-11-11: 1750 mg via INTRAVENOUS
  Filled 2022-11-11: qty 350

## 2022-11-11 MED ORDER — HYDROCOD POLI-CHLORPHE POLI ER 10-8 MG/5ML PO SUER
5.0000 mL | Freq: Two times a day (BID) | ORAL | Status: DC | PRN
  Administered 2022-11-11 – 2022-11-20 (×16): 5 mL via ORAL
  Filled 2022-11-11 (×17): qty 5

## 2022-11-11 NOTE — ED Provider Notes (Signed)
[facility] Provider Note    Event Date/Time   First MD Initiated Contact with Patient 11/11/22 [phone]     (approximate)   History   Shortness of Breath   HPI  [name] Horn is a 46 y.o. female with history of chronic lymphedema on Lasix, rheumatoid arthritis, EOE, recurrent thrush, DVT who presents to the emergency department with complaints of shortness of breath, productive cough that started tonight.  States she felt a little bit short of breath before she went to bed but woke up suddenly short of breath, tachypneic with a wet cough.  She states she did have asthma as a child but has not had any issues with it since.  She is not a smoker.  No known history of PE.  States she has heaviness in her chest.  Reports she has intermittent fevers because she has a "compromised immune system" and has had to receive IVIG before.  She is not on immunosuppressants -off steroids and methotrexate.  Has chronic lower extremity swelling and was recently seen in the lymphedema clinic and had her legs wrapped.  Has been compliant with her Lasix.  No known history of CHF.   History provided by patient and husband.    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   Herpes labialis 08/12/2020   IBD (inflammatory bowel disease)    Intertrigo 06/22/2020   Leukopenia 07/06/2021   Lymphedema    Malaise 12/30/2020   RA (rheumatoid arthritis) ([place])    Rheumatoid arthritis ([place]) 08/12/2020   Transaminitis 08/12/2020   Uterine cancer [facility])     Past Surgical History:  Procedure Laterality Date   APPENDECTOMY     cheek biopsy  11/2020   CHOLECYSTECTOMY     ESOPHAGOGASTRODUODENOSCOPY N/A 04/30/2017   Procedure:  ESOPHAGOGASTRODUODENOSCOPY (EGD);  Surgeon: Teena Irani, MD;  Location: [facility] ENDOSCOPY;  Service: Endoscopy;  Laterality: N/A;   HERNIA REPAIR     x2   KNEE SURGERY     x3   MINIMALLY INVASIVE FORAMINOTOMY CERVICAL SPINE     C6-T1, Nitka   SAVORY DILATION N/A 04/30/2017   Procedure: SAVORY DILATION;  Surgeon: Teena Irani, MD;  Location: [place];  Service: Endoscopy;  Laterality: N/A;   TONSILLECTOMY     TOTAL ABDOMINAL HYSTERECTOMY     Sarcoma, s/p XRT    MEDICATIONS:  Prior to Admission medications   Medication Sig Start Date End Date Taking? Authorizing Provider  amphetamine-dextroamphetamine (ADDERALL) 20 MG tablet Take 20 mg by mouth 3 (three) times daily.    [provider]  atropine 1 % ophthalmic solution Place 1 drop into the left eye daily as needed (for dry eye).    [provider]  baclofen (LIORESAL) 10 MG tablet Take 10 mg by mouth at bedtime. 02/28/20   [provider]  benzonatate (TESSALON) 100 MG capsule Take 1 capsule (100 mg total) by mouth every 8 (eight) hours as needed for cough. Patient not taking: Reported on 09/14/2021 06/22/21   Barrie Folk, PA-C  Bepotastine Besilate 1.5 % SOLN Place 1 drop into both eyes 2 (two) times a day.    [provider]  cyclobenzaprine (FLEXERIL) 10 MG tablet  Take 1 tablet by mouth 3 (three) times daily as needed. 05/05/21   [provider]  cycloSPORINE (RESTASIS) 0.05 % ophthalmic emulsion Place 1 drop into both eyes 2 (two) times daily.    [provider]  dicyclomine (BENTYL) 10 MG capsule Take 10 mg by mouth daily as needed for nausea/vomiting. 01/02/19   [provider]  Dupilumab (Magnolia Avis) Inject into the skin.    [provider]  famotidine (PEPCID) 40 MG tablet Take 40 mg by mouth at bedtime. 05/02/21   [provider]  FLUCONAZOLE PO Take 300 mg by mouth every Monday, Wednesday, and Friday.    [provider]  folic acid (FOLVITE)  1 MG tablet Take 2 tablets (2 mg total) by mouth daily. 10/30/19   Bo Merino, MD  furosemide (LASIX) 20 MG tablet Take 20 mg by mouth daily as needed for edema or fluid.    [provider]  LORazepam (ATIVAN) 1 MG tablet Take 1 mg by mouth 3 (three) times daily as needed for anxiety. 03/08/20   [provider]  magic mouthwash (nystatin, lidocaine, diphenhydrAMINE, alum & mag hydroxide) suspension Swish and spit 5 mLs 3 (three) times daily as needed for mouth pain. 06/10/21   Truman Hayward, MD  meloxicam (MOBIC) 15 MG tablet Take 1 tablet (15 mg total) by mouth daily. Patient taking differently: Take 15 mg by mouth as needed. 07/25/21   Orpah Greek, MD  metoCLOPramide (REGLAN) 10 MG tablet Take 10 mg by mouth daily.     [provider]  metoprolol succinate (TOPROL-XL) 25 MG 24 hr tablet Take 50 mg by mouth daily.    [provider]  nystatin (MYCOSTATIN) 100000 UNIT/ML suspension Take 10 mLs (1,000,000 Units total) by mouth 3 (three) times daily. 12/07/20   Truman Hayward, MD  nystatin powder Apply 1 application topically 2 (two) times daily as needed (rash).    [provider]  ondansetron (ZOFRAN-ODT) 4 MG disintegrating tablet Take 1 tablet (4 mg total) by mouth every 8 (eight) hours as needed for nausea or vomiting. 06/28/22   Blake Divine, MD  oseltamivir (TAMIFLU) 75 MG capsule Take 1 capsule (75 mg total) by mouth every 12 (twelve) hours. Patient not taking: Reported on 12/31/2021 11/13/21   Raspet, Junie Panning K, PA-C  oxyCODONE (ROXICODONE) 5 MG immediate release tablet Take 1 tablet (5 mg total) by mouth every 8 (eight) hours as needed for breakthrough pain (in addition to your baseline medications). Patient not taking: Reported on 10/04/2022 09/25/22 09/25/23  Duffy Bruce, MD  oxycodone-acetaminophen (LYNOX) 10-300 MG tablet Take 1 tablet by mouth 3 (three) times daily.    [provider]  oxyCODONE-acetaminophen  (PERCOCET/ROXICET) 5-325 MG tablet Take 1 tablet by mouth every 6 (six) hours as needed for moderate pain. Patient not taking: Reported on 10/04/2022 12/28/21   [provider]  pantoprazole (PROTONIX) 40 MG tablet Take 1 tablet (40 mg total) by mouth 2 (two) times daily. 05/02/17 08/04/28  Elgergawy, Silver Huguenin, MD  Polyethyl Glycol-Propyl Glycol (SYSTANE) 0.4-0.3 % GEL ophthalmic gel Place 1 application into both eyes in the morning and at bedtime.    [provider]  potassium chloride (K-DUR) 10 MEQ tablet Take 10 mEq by mouth daily as needed (when taking fursoemide).    [provider]  pravastatin (PRAVACHOL) 40 MG tablet Take 40 mg by mouth at bedtime.    [provider]  prednisoLONE acetate (PRED FORTE) 1 % ophthalmic  suspension Place 1 drop into the left eye as needed (conjunctivitis).    [provider]  promethazine (PHENERGAN) 25 MG tablet Take 1 tablet (25 mg total) by mouth every 6 (six) hours as needed for nausea or vomiting. 09/25/22   Duffy Bruce, MD  sucralfate (CARAFATE) 1 g tablet Take 1 tablet (1 g total) by mouth 4 (four) times daily for 7 days. 09/25/22 10/04/22  Duffy Bruce, MD  SUMAtriptan (IMITREX) 25 MG tablet Take 25 mg by mouth every 2 (two) hours as needed for migraine. May repeat in 2 hours if headache persists or recurs.    [provider]  topiramate (TOPAMAX) 100 MG tablet Take 100 mg by mouth daily. 06/18/19   [provider]  topiramate (TOPAMAX) 25 MG tablet Take 25 mg by mouth at bedtime.     [provider]  traZODone (DESYREL) 150 MG tablet Take 150 mg by mouth at bedtime as needed for sleep.    [provider]  triamcinolone lotion (KENALOG) 0.1 % Apply 1 application topically daily as needed (rash). 07/07/20   [provider]  valACYclovir (VALTREX) 1000 MG tablet Take 1 tablet (1,000 mg total) by mouth daily. Patient not taking: Reported on 10/04/2022 08/12/20   Tommy Medal,  Lavell Islam, MD    Physical Exam   Triage Vital Signs: ED Triage Vitals  Enc Vitals Group     BP 11/11/22 0424 119/85     Pulse Rate 11/11/22 0424 94     Resp 11/11/22 0424 (!) 22     Temp 11/11/22 0424 97.9 F (36.6 C)     Temp Source 11/11/22 0424 Oral     SpO2 11/11/22 0424 97 %     Weight 11/11/22 0422 179 lb (81.2 kg)     Height 11/11/22 0422 '5\' 4"'$  (1.626 m)     Head Circumference --      Peak Flow --      Pain Score 11/11/22 0422 7     Pain Loc --      Pain Edu? --      Excl. in [place]? --     Most recent vital signs: Vitals:   11/11/22 0424 11/11/22 0621  BP: 119/85   Pulse: 94   Resp: (!) 22   Temp: 97.9 F (36.6 C) 99.4 F (37.4 C)  SpO2: 97%     CONSTITUTIONAL: Alert and oriented and responds appropriately to questions.  Peers uncomfortable and in mild respiratory distress HEAD: Normocephalic, atraumatic EYES: Conjunctivae clear, pupils appear equal, sclera nonicteric ENT: normal nose; moist mucous membranes NECK: Supple, normal ROM CARD: RRR; S1 and S2 appreciated; no murmurs, no clicks, no rubs, no gallops RESP: Patient is slightly tachypneic.  No hypoxia at rest.  Speaking in short sentences.  Mild respiratory distress.  She has inspiratory and expiratory wheezes and rhonchi heard diffusely.  She has a very wet cough. ABD/GI: Normal bowel sounds; non-distended; soft, non-tender, no rebound, no guarding, no peritoneal signs BACK: The back appears normal EXT: Normal ROM in all joints; no deformity noted, extremities are currently wrapped but legs are swollen and she does have some mild swelling into the lower abdomen SKIN: Normal color for age and race; warm; no rash on exposed skin NEURO: Moves all extremities equally, normal speech PSYCH: The patient's mood and manner are appropriate.   ED Results / Procedures / Treatments   LABS: (all labs ordered are listed, but only abnormal results are displayed) Labs Reviewed  BASIC METABOLIC PANEL -  Abnormal;  Notable for the following components:      Result Value   Glucose, Bld 116 (*)    All other components within normal limits  RESP PANEL BY RT-PCR (FLU A&B, COVID) ARPGX2  RESPIRATORY PANEL BY PCR  EXPECTORATED SPUTUM ASSESSMENT W GRAM STAIN, RFLX TO RESP C  CBC  BRAIN NATRIURETIC PEPTIDE  PROCALCITONIN  TROPONIN I (HIGH SENSITIVITY)  TROPONIN I (HIGH SENSITIVITY)     EKG:  EKG Interpretation  Date/Time:  Friday November 11 2022 04:27:10 EST Ventricular Rate:  84 PR Interval:  163 QRS Duration: 94 QT Interval:  377 QTC Calculation: 446 R Axis:   2 Text Interpretation: Sinus rhythm Confirmed by Pryor Curia [id number]) on 11/11/2022 4:46:28 AM         RADIOLOGY: My personal review and interpretation of imaging: Chest x-ray, CTA chest showed no PE, infiltrate or edema.  I have personally reviewed all radiology reports.   CT Angio Chest PE W and/or Wo Contrast  Result Date: 11/11/2022 CLINICAL DATA:  46 year old female with history of shortness of breath and congestion. Mid chest pain. EXAM: CT ANGIOGRAPHY CHEST WITH CONTRAST TECHNIQUE: Multidetector CT imaging of the chest was performed using the standard protocol during bolus administration of intravenous contrast. Multiplanar CT image reconstructions and MIPs were obtained to evaluate the vascular anatomy. RADIATION DOSE REDUCTION: This exam was performed according to the departmental dose-optimization program which includes automated exposure control, adjustment of the mA and/or kV according to patient size and/or use of iterative reconstruction technique. CONTRAST:  44m OMNIPAQUE IOHEXOL 350 MG/ML SOLN COMPARISON:  CTA chest 09/25/2022. FINDINGS: Comment: Today's study is limited by considerable patient respiratory motion. Cardiovascular: Within the limitations of today's examination, there is no central, lobar or proximal segmental sized filling defect. Distal segmental and subsegmental sized filling defects can not be entirely  excluded on today's motion limited examination. Heart size is normal. There is no significant pericardial fluid, thickening or pericardial calcification. No atherosclerotic calcifications are noted in the thoracic aorta or the coronary arteries. Mediastinum/Nodes: No pathologically enlarged mediastinal or hilar lymph nodes. Patulous fluid-filled esophagus. No axillary lymphadenopathy. Lungs/Pleura: No acute consolidative airspace disease. No pleural effusions. No definite suspicious appearing pulmonary nodules or masses are noted on today's motion limited examination. Upper Abdomen: Diffuse low attenuation throughout the visualized hepatic parenchyma, indicative of a background of severe hepatic steatosis. Status post cholecystectomy. Musculoskeletal: Electronic device in the medial aspect of the left breast, presumably an implantable loop recorder. There are no aggressive appearing lytic or blastic lesions noted in the visualized portions of the skeleton. Review of the MIP images confirms the above findings. IMPRESSION: 1. Limited study secondary to patient respiratory motion. No central, lobar or proximal segmental sized pulmonary embolism identified. 2. No other acute findings noted in the thorax to account for the patient's symptoms. 3. Severe hepatic steatosis. Electronically Signed   By: DVinnie LangtonM.D.   On: 11/11/2022 06:28   DG Chest Portable 1 View  Result Date: 11/11/2022 CLINICAL DATA:  46year old female with history of shortness of breath. EXAM: PORTABLE CHEST 1 VIEW COMPARISON:  Chest x-ray 09/25/2022. FINDINGS: Lung volumes are low. No consolidative airspace disease. No pleural effusions. No pneumothorax. No pulmonary nodule or mass noted. Pulmonary vasculature and the cardiomediastinal silhouette are within normal limits. Electronic device projecting over the left hemithorax, presumably an implantable loop recorder. IMPRESSION: 1. Low lung volumes without radiographic evidence of acute  cardiopulmonary disease. Electronically Signed   By: DMauri BrooklynD.  On: 11/11/2022 05:05     PROCEDURES:  Critical Care performed: Yes, see critical care procedure note(s)   CRITICAL CARE Performed by: Cyril Mourning Dedric Ethington   Total critical care time: 45 minutes  Critical care time was exclusive of separately billable procedures and treating other patients.  Critical care was necessary to treat or prevent imminent or life-threatening deterioration.  Critical care was time spent personally by me on the following activities: development of treatment plan with patient and/or surrogate as well as nursing, discussions with consultants, evaluation of patient's response to treatment, examination of patient, obtaining history from patient or surrogate, ordering and performing treatments and interventions, ordering and review of laboratory studies, ordering and review of radiographic studies, pulse oximetry and re-evaluation of patient's condition.   Marland Kitchen1-3 Lead EKG Interpretation  Performed by: Erlinda Solinger, Delice Bison, DO Authorized by: Lorynn Moeser, Delice Bison, DO     Interpretation: normal     ECG rate:  94   ECG rate assessment: normal     Rhythm: sinus rhythm     Ectopy: none     Conduction: normal       IMPRESSION / MDM / ASSESSMENT AND PLAN / ED COURSE  I reviewed the triage vital signs and the nursing notes.    Patient here with complaints of shortness of breath, productive cough, wheezing.  The patient is on the cardiac monitor to evaluate for evidence of arrhythmia and/or significant heart rate changes.   DIFFERENTIAL DIAGNOSIS (includes but not limited to):   CHF exacerbation, pulmonary edema, PE, pneumothorax, pneumonia, viral URI, ARDS, ACS, bronchitis, bronchospasm   Patient's presentation is most consistent with acute presentation with potential threat to life or bodily function.   PLAN: We will obtain CBC, BMP, troponin x2, BNP, COVID and flu swabs, respiratory viral panel,  chest x-ray.  EKG nonischemic.  Will give breathing treatments, Solu-Medrol as she is wheezing.   MEDICATIONS GIVEN IN ED: Medications  cefTRIAXone (ROCEPHIN) 2 g in sodium chloride 0.9 % 100 mL IVPB (2 g Intravenous New Bag/Given 11/11/22 0649)  azithromycin (ZITHROMAX) 500 mg in sodium chloride 0.9 % 250 mL IVPB (500 mg Intravenous New Bag/Given 11/11/22 0649)  albuterol (PROVENTIL) (2.5 MG/3ML) 0.083% nebulizer solution 2.5 mg (has no administration in time range)  dextromethorphan-guaiFENesin (MUCINEX DM) 30-600 MG per 12 hr tablet 1 tablet (has no administration in time range)  ipratropium-albuterol (DUONEB) 0.5-2.5 (3) MG/3ML nebulizer solution 3 mL (has no administration in time range)  ondansetron (ZOFRAN) injection 4 mg (has no administration in time range)  acetaminophen (TYLENOL) tablet 650 mg (has no administration in time range)  albuterol (PROVENTIL) (2.5 MG/3ML) 0.083% nebulizer solution 5 mg (5 mg Nebulization Given 11/11/22 0513)  ipratropium (ATROVENT) nebulizer solution 0.5 mg (0.5 mg Nebulization Given 11/11/22 0513)  methylPREDNISolone sodium succinate (SOLU-MEDROL) 125 mg/2 mL injection 125 mg (125 mg Intravenous Given 11/11/22 0542)  albuterol (PROVENTIL) (2.5 MG/3ML) 0.083% nebulizer solution 5 mg (5 mg Nebulization Given 11/11/22 0542)  ipratropium (ATROVENT) nebulizer solution 0.5 mg (0.5 mg Nebulization Given 11/11/22 0543)  ketorolac (TORADOL) 30 MG/ML injection 30 mg (30 mg Intravenous Given 11/11/22 0542)  iohexol (OMNIPAQUE) 350 MG/ML injection 75 mL (75 mLs Intravenous Contrast Given 11/11/22 0616)  albuterol (PROVENTIL) (2.5 MG/3ML) 0.083% nebulizer solution 5 mg (5 mg Nebulization Given 11/11/22 0648)  ipratropium (ATROVENT) nebulizer solution 0.5 mg (0.5 mg Nebulization Given 11/11/22 0648)  chlorpheniramine-HYDROcodone (TUSSIONEX) 10-8 MG/5ML suspension 5 mL (5 mLs Oral Given 11/11/22 4098)     ED COURSE: Patient appears to  be improving slightly with  breathing treatments but still has very rhonchorous breath sounds and some wheezing.  Chest x-ray reviewed and interpreted by myself and the radiologist and shows no acute abnormality.  No leukocytosis or leukopenia.  Negative BNP.  Normal troponin.  Procalcitonin slightly elevated at 0.27.  She now feels very warm to touch.  Will check rectal temperature.  COVID, flu and respiratory viral panel are pending.  Will obtain CTA of her chest for further evaluation.    6:42 AM  Pt continues to have productive cough with yellow sputum, wheezing although breath sounds have improved.  We will send sputum culture.  I feel she will need admission to the hospital for continued bronchospasm possibly triggered by viral URI versus developing bacterial pneumonia.  Given yellow sputum production and procalcitonin of 0.27, will go ahead and cover with Rocephin and azithromycin for community-acquired pneumonia.  CTA of the chest reviewed and interpreted by myself and the radiologist and shows no PE, edema or infiltrate.  Rectal temp 99.4.  COVID and flu negative.  Will give Tussionex for symptomatic relief.  Patient and husband are comfortable with plan.   CONSULTS:  Consulted and discussed patient's case with hospitalist, Dr. Blaine Hamper.  I have recommended admission and consulting physician agrees and will place admission orders.  Patient (and family if present) agree with this plan.   I reviewed all nursing notes, vitals, pertinent previous records.  All labs, EKGs, imaging ordered have been independently reviewed and interpreted by myself.    OUTSIDE RECORDS REVIEWED: Reviewed patient's note from the lymphedema clinic yesterday.  Reviewed patient's cardiology note from [place] on 09/19/2022.  History of atrial tachycardia, PVCs, recurrent syncope with loop recorder in place.  Last echo was in June 2020 but unfortunately I am not able to see the results.     FINAL CLINICAL IMPRESSION(S) / ED DIAGNOSES   Final  diagnoses:  SOB (shortness of breath)  Bronchospasm, acute  Respiratory distress     Rx / DC Orders   ED Discharge Orders     None        Note:  This document was prepared using Dragon voice recognition software and may include unintentional dictation errors.   Arion Morgan, Delice Bison, DO 11/11/22 [phone]

## 2022-11-11 NOTE — ED Triage Notes (Addendum)
Pt to triage via w/c, audible rhales; st awoke with onset SHOB, "congestion" and mid CP; legs are wrapped for  lymphedema but swelling has been increasing extending from lower legs to upper legs and abdomen recently; takes '60mg'$  lasix BID

## 2022-11-11 NOTE — ED Notes (Signed)
ED TO INPATIENT HANDOFF REPORT  ED Nurse Name and Phone #: Baxter Flattery, RN  S Name/Age/Gender [name] Horn 46 y.o. female Room/Bed: ED12A/ED12A  Code Status   Code Status: Full Code  Home/SNF/Other Home Patient oriented to: self, place, time, and situation Is this baseline? Yes   Triage Complete: Triage complete  Chief Complaint Acute bronchitis [J20.9]  Triage Note Pt to triage via w/c, audible rhales; st awoke with onset SHOB, "congestion" and mid CP; legs are wrapped for  lymphedema but swelling has been increasing extending from lower legs to upper legs and abdomen recently; takes '60mg'$  lasix BID   Allergies Allergies  Allergen Reactions   Other     Dermabond surgical glue.    Sulfa Antibiotics Anaphylaxis, Shortness Of Breath, Swelling and Rash    Angioedema (also)   Sulfonamide Derivatives Hives, Shortness Of Breath and Swelling    TONGUE SWELLS   Benadryl [Diphenhydramine Hcl] Other (See Comments)    Hyperactivity    Silicone     Watch band     Level of Care/Admitting Diagnosis ED Disposition     ED Disposition  Admit   Condition  --   Comment  Hospital Area: [facility] [[id number]]  Level of Care: Telemetry Medical [104]  Covid Evaluation: Confirmed COVID Negative  Diagnosis: Acute bronchitis [466.0.ICD-9-CM]  Admitting Physician: Ivor Costa [[id number]]  Attending Physician: Ivor Costa [[id number]]          B Medical/Surgery History Past Medical History:  Diagnosis Date   Cellulitis, leg 23/53/6144   Complication of anesthesia    " i TAKE A LIITLE BIT LONGER TO WAKRE UP "   COVID-19 07/06/2021   DVT (deep venous thrombosis) (HCC)    x2   Fatigue 12/30/2020   Gastroparesis 08/07/2019   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   Herpes labialis 08/12/2020   IBD (inflammatory bowel disease)    Intertrigo 06/22/2020   Leukopenia 07/06/2021   Lymphedema    Malaise 12/30/2020   RA (rheumatoid arthritis) ([place])    Rheumatoid arthritis ([place])  08/12/2020   Transaminitis 08/12/2020   Uterine cancer ([place])    Past Surgical History:  Procedure Laterality Date   APPENDECTOMY     cheek biopsy  11/2020   CHOLECYSTECTOMY     ESOPHAGOGASTRODUODENOSCOPY N/A 04/30/2017   Procedure: ESOPHAGOGASTRODUODENOSCOPY (EGD);  Surgeon: Teena Irani, MD;  Location: [facility] ENDOSCOPY;  Service: Endoscopy;  Laterality: N/A;   HERNIA REPAIR     x2   KNEE SURGERY     x3   MINIMALLY INVASIVE FORAMINOTOMY CERVICAL SPINE     C6-T1, Nitka   SAVORY DILATION N/A 04/30/2017   Procedure: SAVORY DILATION;  Surgeon: Teena Irani, MD;  Location: [facility];  Service: Endoscopy;  Laterality: N/A;   TONSILLECTOMY     TOTAL ABDOMINAL HYSTERECTOMY     Sarcoma, s/p XRT     A IV Location/Drains/Wounds Patient Lines/Drains/Airways Status     Active Line/Drains/Airways     Name Placement date Placement time Site Days   Peripheral IV 11/11/22 20 G 1.88" Anterior;Right;Medial Forearm 11/11/22  0449  Forearm  less than 1            Intake/Output Last 24 hours No intake or output data in the 24 hours ending 11/11/22 3154  Labs/Imaging Results for orders placed or performed during the hospital encounter of 11/11/22 (from the past 48 hour(s))  Basic metabolic panel     Status: Abnormal   Collection Time: 11/11/22  4:29 AM  Result Value Ref  Range   Sodium 137 135 - 145 mmol/L   Potassium 3.7 3.5 - 5.1 mmol/L    Comment: HEMOLYSIS AT THIS LEVEL MAY AFFECT RESULT   Chloride 98 98 - 111 mmol/L   CO2 30 22 - 32 mmol/L   Glucose, Bld 116 (H) 70 - 99 mg/dL    Comment: Glucose reference range applies only to samples taken after fasting for at least 8 hours.   BUN 10 6 - 20 mg/dL   Creatinine, Ser 0.90 0.44 - 1.00 mg/dL   Calcium 10.0 8.9 - 10.3 mg/dL   GFR, Estimated >60 >60 mL/min    Comment: (NOTE) Calculated using the CKD-EPI Creatinine Equation (2021)    Anion gap 9 5 - 15    Comment: Performed at [facility], [address] [place], [place]  [address]  CBC     Status: None   Collection Time: 11/11/22  4:29 AM  Result Value Ref Range   WBC 9.2 4.0 - 10.5 K/uL   RBC 4.46 3.87 - 5.11 MIL/uL   Hemoglobin 14.0 12.0 - 15.0 g/dL   HCT 41.1 36.0 - 46.0 %   MCV 92.2 80.0 - 100.0 fL   MCH 31.4 26.0 - 34.0 pg   MCHC 34.1 30.0 - 36.0 g/dL   RDW 13.0 11.5 - 15.5 %   Platelets 193 150 - 400 K/uL   nRBC 0.0 0.0 - 0.2 %    Comment: Performed at [facility], [place], [place] [address]  Troponin I (High Sensitivity)     Status: None   Collection Time: 11/11/22  4:29 AM  Result Value Ref Range   Troponin I (High Sensitivity) 3 <18 ng/L    Comment: (NOTE) Elevated high sensitivity troponin I (hsTnI) values and significant  changes across serial measurements may suggest ACS but many other  chronic and acute conditions are known to elevate hsTnI results.  Refer to the "Links" section for chest pain algorithms and additional  guidance. Performed at [facility], [address] [place], [place] [address]   Brain natriuretic peptide     Status: None   Collection Time: 11/11/22  4:29 AM  Result Value Ref Range   B Natriuretic Peptide 15.4 0.0 - 100.0 pg/mL    Comment: Performed at [facility], [address] [place], [place] [address]  Procalcitonin - Baseline     Status: None   Collection Time: 11/11/22  4:29 AM  Result Value Ref Range   Procalcitonin 0.27 ng/mL    Comment:        Interpretation: PCT (Procalcitonin) <= 0.5 ng/mL: Systemic infection (sepsis) is not likely. Local bacterial infection is possible. (NOTE)       Sepsis PCT Algorithm           Lower Respiratory Tract                                      Infection PCT Algorithm    ----------------------------     ----------------------------         PCT < 0.25 ng/mL                PCT < 0.10 ng/mL          Strongly encourage             Strongly discourage   discontinuation of antibiotics    initiation of antibiotics     ----------------------------     -----------------------------  PCT 0.25 - 0.50 ng/mL            PCT 0.10 - 0.25 ng/mL               OR       >80% decrease in PCT            Discourage initiation of                                            antibiotics      Encourage discontinuation           of antibiotics    ----------------------------     -----------------------------         PCT >= 0.50 ng/mL              PCT 0.26 - 0.50 ng/mL               AND        <80% decrease in PCT             Encourage initiation of                                             antibiotics       Encourage continuation           of antibiotics    ----------------------------     -----------------------------        PCT >= 0.50 ng/mL                  PCT > 0.50 ng/mL               AND         increase in PCT                  Strongly encourage                                      initiation of antibiotics    Strongly encourage escalation           of antibiotics                                     -----------------------------                                           PCT <= 0.25 ng/mL                                                 OR                                        > 80% decrease in PCT  Discontinue / Do not initiate                                             antibiotics  Performed at [facility], [address] [place], [place] [address]   Resp Panel by RT-PCR (Flu A&B, Covid) Anterior Nasal Swab     Status: None   Collection Time: 11/11/22  5:33 AM   Specimen: Anterior Nasal Swab  Result Value Ref Range   SARS Coronavirus 2 by RT PCR NEGATIVE NEGATIVE    Comment: (NOTE) SARS-CoV-2 target nucleic acids are NOT DETECTED.  The SARS-CoV-2 RNA is generally detectable in upper respiratory specimens during the acute phase of infection. The lowest concentration of SARS-CoV-2 viral copies this assay can detect is 138 copies/mL. A negative  result does not preclude SARS-Cov-2 infection and should not be used as the sole basis for treatment or other patient management decisions. A negative result may occur with  improper specimen collection/handling, submission of specimen other than nasopharyngeal swab, presence of viral mutation(s) within the areas targeted by this assay, and inadequate number of viral copies(<138 copies/mL). A negative result must be combined with clinical observations, patient history, and epidemiological information. The expected result is Negative.  Fact Sheet for Patients:  EntrepreneurPulse.com.au  Fact Sheet for Healthcare Providers:  IncredibleEmployment.be  This test is no t yet approved or cleared by the [place] FDA and  has been authorized for detection and/or diagnosis of SARS-CoV-2 by FDA under an Emergency Use Authorization (EUA). This EUA will remain  in effect (meaning this test can be used) for the duration of the COVID-19 declaration under Section 564(b)(1) of the Act, 21 U.S.C.section 360bbb-3(b)(1), unless the authorization is terminated  or revoked sooner.       Influenza A by PCR NEGATIVE NEGATIVE   Influenza B by PCR NEGATIVE NEGATIVE    Comment: (NOTE) The Xpert Xpress SARS-CoV-2/FLU/RSV plus assay is intended as an aid in the diagnosis of influenza from Nasopharyngeal swab specimens and should not be used as a sole basis for treatment. Nasal washings and aspirates are unacceptable for Xpert Xpress SARS-CoV-2/FLU/RSV testing.  Fact Sheet for Patients: EntrepreneurPulse.com.au  Fact Sheet for Healthcare Providers: IncredibleEmployment.be  This test is not yet approved or cleared by the [place] FDA and has been authorized for detection and/or diagnosis of SARS-CoV-2 by FDA under an Emergency Use Authorization (EUA). This EUA will remain in effect (meaning this test can be used) for the  duration of the COVID-19 declaration under Section 564(b)(1) of the Act, 21 U.S.C. section 360bbb-3(b)(1), unless the authorization is terminated or revoked.  Performed at [facility], [place], [place] [address]   Troponin I (High Sensitivity)     Status: None   Collection Time: 11/11/22  6:58 AM  Result Value Ref Range   Troponin I (High Sensitivity) 6 <18 ng/L    Comment: (NOTE) Elevated high sensitivity troponin I (hsTnI) values and significant  changes across serial measurements may suggest ACS but many other  chronic and acute conditions are known to elevate hsTnI results.  Refer to the "Links" section for chest pain algorithms and additional  guidance. Performed at [facility], [place], [place] [address]    CT Angio Chest PE W and/or Wo Contrast  Result Date: 11/11/2022 CLINICAL DATA:  46 year old female with history of  shortness of breath and congestion. Mid chest pain. EXAM: CT ANGIOGRAPHY CHEST WITH CONTRAST TECHNIQUE: Multidetector CT imaging of the chest was performed using the standard protocol during bolus administration of intravenous contrast. Multiplanar CT image reconstructions and MIPs were obtained to evaluate the vascular anatomy. RADIATION DOSE REDUCTION: This exam was performed according to the departmental dose-optimization program which includes automated exposure control, adjustment of the mA and/or kV according to patient size and/or use of iterative reconstruction technique. CONTRAST:  49m OMNIPAQUE IOHEXOL 350 MG/ML SOLN COMPARISON:  CTA chest 09/25/2022. FINDINGS: Comment: Today's study is limited by considerable patient respiratory motion. Cardiovascular: Within the limitations of today's examination, there is no central, lobar or proximal segmental sized filling defect. Distal segmental and subsegmental sized filling defects can not be entirely excluded on today's motion limited examination. Heart size is  normal. There is no significant pericardial fluid, thickening or pericardial calcification. No atherosclerotic calcifications are noted in the thoracic aorta or the coronary arteries. Mediastinum/Nodes: No pathologically enlarged mediastinal or hilar lymph nodes. Patulous fluid-filled esophagus. No axillary lymphadenopathy. Lungs/Pleura: No acute consolidative airspace disease. No pleural effusions. No definite suspicious appearing pulmonary nodules or masses are noted on today's motion limited examination. Upper Abdomen: Diffuse low attenuation throughout the visualized hepatic parenchyma, indicative of a background of severe hepatic steatosis. Status post cholecystectomy. Musculoskeletal: Electronic device in the medial aspect of the left breast, presumably an implantable loop recorder. There are no aggressive appearing lytic or blastic lesions noted in the visualized portions of the skeleton. Review of the MIP images confirms the above findings. IMPRESSION: 1. Limited study secondary to patient respiratory motion. No central, lobar or proximal segmental sized pulmonary embolism identified. 2. No other acute findings noted in the thorax to account for the patient's symptoms. 3. Severe hepatic steatosis. Electronically Signed   By: DVinnie LangtonM.D.   On: 11/11/2022 06:28   DG Chest Portable 1 View  Result Date: 11/11/2022 CLINICAL DATA:  46year old female with history of shortness of breath. EXAM: PORTABLE CHEST 1 VIEW COMPARISON:  Chest x-ray 09/25/2022. FINDINGS: Lung volumes are low. No consolidative airspace disease. No pleural effusions. No pneumothorax. No pulmonary nodule or mass noted. Pulmonary vasculature and the cardiomediastinal silhouette are within normal limits. Electronic device projecting over the left hemithorax, presumably an implantable loop recorder. IMPRESSION: 1. Low lung volumes without radiographic evidence of acute cardiopulmonary disease. Electronically Signed   By: DVinnie LangtonM.D.   On: 11/11/2022 05:05    Pending Labs Unresulted Labs (From admission, onward)     Start     Ordered   11/12/22 0500  CBC  Tomorrow morning,   R        11/11/22 0741   11/11/22 0812  Pregnancy, urine  Once,   R        11/11/22 0811   11/11/22 0741  HIV Antibody (routine testing w rflx)  (HIV Antibody (Routine testing w reflex) panel)  Once,   R        11/11/22 0741   11/11/22 0741  Strep pneumoniae urinary antigen  (COPD / Pneumonia / Cellulitis / Lower Extremity Wound)  Once,   R        11/11/22 0741   11/11/22 0740  Culture, blood (x 2)  BLOOD CULTURE X 2,   R     Comments: INITIATE ANTIBIOTICS WITHIN 1 HOUR AFTER BLOOD CULTURES DRAWN.  If unable to obtain blood cultures, call MD immediately regarding antibiotic instructions.    11/11/22 0740  11/11/22 0740  Lactic acid, plasma  STAT Now then every 2 hours,   R      11/11/22 0740   11/11/22 0644  Expectorated Sputum Assessment w Gram Stain, Rflx to [employer],   URGENT        11/11/22 0643   11/11/22 0456  Respiratory (~20 pathogens) panel by PCR  (Respiratory panel by PCR (~20 pathogens, ~24 hr TAT)  w precautions)  ONCE - URGENT,   URGENT        11/11/22 0455            Vitals/Pain Today's Vitals   11/11/22 0422 11/11/22 0424 11/11/22 0621  BP:  119/85   Pulse:  94   Resp:  (!) 22   Temp:  97.9 F (36.6 C) 99.4 F (37.4 C)  TempSrc:  Oral Rectal  SpO2:  97%   Weight: 179 lb (81.2 kg)    Height: '5\' 4"'$  (1.626 m)    PainSc: 7       Isolation Precautions No active isolations  Medications Medications  albuterol (PROVENTIL) (2.5 MG/3ML) 0.083% nebulizer solution 2.5 mg (has no administration in time range)  dextromethorphan-guaiFENesin (MUCINEX DM) 30-600 MG per 12 hr tablet 1 tablet (has no administration in time range)  ipratropium-albuterol (DUONEB) 0.5-2.5 (3) MG/3ML nebulizer solution 3 mL (3 mLs Nebulization Given 11/11/22 0738)  ondansetron (ZOFRAN) injection 4 mg (has no  administration in time range)  acetaminophen (TYLENOL) tablet 650 mg (has no administration in time range)  cefTRIAXone (ROCEPHIN) 2 g in sodium chloride 0.9 % 100 mL IVPB (has no administration in time range)  azithromycin (ZITHROMAX) 500 mg in sodium chloride 0.9 % 250 mL IVPB (has no administration in time range)  enoxaparin (LOVENOX) injection 40 mg (has no administration in time range)  albuterol (PROVENTIL) (2.5 MG/3ML) 0.083% nebulizer solution 5 mg (5 mg Nebulization Given 11/11/22 0513)  ipratropium (ATROVENT) nebulizer solution 0.5 mg (0.5 mg Nebulization Given 11/11/22 0513)  methylPREDNISolone sodium succinate (SOLU-MEDROL) 125 mg/2 mL injection 125 mg (125 mg Intravenous Given 11/11/22 0542)  albuterol (PROVENTIL) (2.5 MG/3ML) 0.083% nebulizer solution 5 mg (5 mg Nebulization Given 11/11/22 0542)  ipratropium (ATROVENT) nebulizer solution 0.5 mg (0.5 mg Nebulization Given 11/11/22 0543)  ketorolac (TORADOL) 30 MG/ML injection 30 mg (30 mg Intravenous Given 11/11/22 0542)  iohexol (OMNIPAQUE) 350 MG/ML injection 75 mL (75 mLs Intravenous Contrast Given 11/11/22 0616)  albuterol (PROVENTIL) (2.5 MG/3ML) 0.083% nebulizer solution 5 mg (5 mg Nebulization Given 11/11/22 0648)  ipratropium (ATROVENT) nebulizer solution 0.5 mg (0.5 mg Nebulization Given 11/11/22 0648)  chlorpheniramine-HYDROcodone (TUSSIONEX) 10-8 MG/5ML suspension 5 mL (5 mLs Oral Given 11/11/22 0648)  cefTRIAXone (ROCEPHIN) 2 g in sodium chloride 0.9 % 100 mL IVPB (0 g Intravenous Stopped 11/11/22 0733)  azithromycin (ZITHROMAX) 500 mg in sodium chloride 0.9 % 250 mL IVPB (0 mg Intravenous Stopped 11/11/22 0757)    Mobility walks High fall risk   Focused Assessments Pulmonary Assessment Handoff:  Lung sounds: L Breath Sounds: Rales R Breath Sounds: Rales O2 Device: Room Air      R Recommendations: See Admitting Provider Note  Report given to:   Additional Notes:

## 2022-11-11 NOTE — Consult Note (Signed)
Pharmacy Antibiotic Note  [name] Horn is a 46 y.o. female admitted on 11/11/2022 with  shortness of breath .  Pharmacy has been consulted for Vancomycin and Cefepime dosing.  Plan: Vancomycin 2g IV x 1 ordered as loading dose, followed by 1750 mg IV Q 24 hrs. Goal AUC 400-550. Expected AUC: 505.7 Expected Cmin: 10.7 SCr used: 0.9, Vd used: 0.72  Cefepime 2g IV Q8 hours   Height: '5\' 4"'$  (162.6 cm) Weight: 81.2 kg (179 lb) IBW/kg (Calculated) : 54.7  Temp (24hrs), Avg:98.8 F (37.1 C), Min:97.9 F (36.6 C), Max:99.4 F (37.4 C)  Recent Labs  Lab 11/11/22 0429 11/11/22 0804 11/11/22 0915  WBC 9.2  --   --   CREATININE 0.90  --   --   LATICACIDVEN  --  3.8* 4.9*    Estimated Creatinine Clearance: 78.8 mL/min (by C-G formula based on SCr of 0.9 mg/dL).    Allergies  Allergen Reactions   Other     Dermabond surgical glue.    Sulfa Antibiotics Anaphylaxis, Shortness Of Breath, Swelling and Rash    Angioedema (also)   Sulfonamide Derivatives Hives, Shortness Of Breath and Swelling    TONGUE SWELLS   Benadryl [Diphenhydramine Hcl] Other (See Comments)    Hyperactivity    Silicone     Watch band     Antimicrobials this admission: Vanc 11/17 >>  Cefepime 11/17 >>  Azithro/Rocephin 11/17 x 1  Dose adjustments this admission: N/A  Microbiology results: 11/17 BCx: pending 11/17 Resp panel: NG  11/17 Sputum: negative    Thank you for allowing pharmacy to be a part of this patient's care.  Gatlyn Lipari A Koby Hartfield 11/11/2022 1:39 PM

## 2022-11-11 NOTE — H&P (Addendum)
History and Physical    [name] Horn HDQ:[id number] DOB: 08-Apr-1976 DOA: 11/11/2022  Referring MD/NP/PA:   PCP: Alan Ripper, [place]   Patient coming from:  The patient is coming from home.  At baseline, pt is independent for most of ADL.        Chief Complaint: SOB  HPI: [name] Horn is a 46 y.o. 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 tach and atrial tachycardia, has loop recorder in place), obesity with body mass index (BMI) of 30.73, oral thrush, who presents with SOB.  Patient states that her symptoms started yesterday, including shortness breath, cough, wheezing.  She has productive cough with yellow-colored sputum production.  She also has intermittent subjective fever and chills. Her temperature is 99.4 in ED.  Patient reports chest pain, which is located in the front chest, mild to moderate, nonradiating, sharp, pleuritic, aggravated by deep breathing and coughing.  Patient has nausea, dry heaves, no vomiting, diarrhea or abdominal pain.  No symptoms of UTI. She has lymphedema  and had her legs wrapped.  Has been compliant with her Lasix.  No known history of CHF. She states she did have asthma as a child but has not had any issues with it since.    Patient used to take methotrexate, prednisone and IVIG for compromised immune system.  Due to oral thrush, her immunologist at the [facility] hold off her methotrexate, prednisone and IVIG.  Currently patient is taking Dupixent once a week.  Last dose was on 11/10.  Data reviewed independently and ED Course: pt was found to have WBC 9.2, troponin level 3, BNP 15, negative COVID PCR, negative flu a and B, electrolytes renal function okay, temperature 99.4, blood pressure 119/85, heart rate 92, RR 22,  oxygen saturation 97% on room air.  Chest x-ray showed lower lobe lung volume without infiltration.  CTA is limited study, negative for PE.  Patient is placed on telemetry bed for observation.   EKG: I have personally reviewed.  Sinus rhythm, QTc 446, nonspecific T wave change.   Review of Systems:   General: no fevers, chills, no body weight gain, fatigue HEENT: no blurry vision, hearing changes or sore throat Respiratory: has dyspnea, coughing, wheezing CV: has chest pain, no palpitations GI: has nausea, no vomiting, abdominal pain, diarrhea, constipation GU: no dysuria, burning on urination, increased urinary frequency, hematuria  Ext: has leg edema and chronic lymphedema Neuro: no unilateral weakness, numbness, or tingling, no vision change or hearing loss Skin: no rash, no skin tear. MSK: No muscle spasm, no deformity, no limitation of range of movement in spin Heme: No easy bruising.  Travel history: No recent long distant travel.   Allergy:  Allergies  Allergen Reactions   Other     Dermabond surgical glue.    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   Herpes labialis 08/12/2020   IBD (inflammatory bowel disease)    Intertrigo 06/22/2020   Leukopenia 07/06/2021   Lymphedema    Malaise 12/30/2020   RA (rheumatoid arthritis) ([place])    Rheumatoid arthritis ([place]) 08/12/2020   Transaminitis 08/12/2020   Uterine cancer [facility])     Past Surgical  History:  Procedure Laterality Date   APPENDECTOMY     cheek biopsy  11/2020   CHOLECYSTECTOMY     ESOPHAGOGASTRODUODENOSCOPY N/A 04/30/2017   Procedure: ESOPHAGOGASTRODUODENOSCOPY (EGD);  Surgeon: Teena Irani, MD;  Location: [facility] ENDOSCOPY;  Service: Endoscopy;  Laterality: N/A;   HERNIA REPAIR     x2   KNEE SURGERY     x3   MINIMALLY INVASIVE FORAMINOTOMY CERVICAL SPINE     C6-T1, Nitka   SAVORY DILATION N/A 04/30/2017   Procedure: SAVORY DILATION;  Surgeon: Teena Irani, MD;  Location: [place];  Service: Endoscopy;  Laterality: N/A;   TONSILLECTOMY     TOTAL ABDOMINAL HYSTERECTOMY     Sarcoma, s/p XRT    Social History:  reports that she has never smoked. She has been exposed to tobacco smoke. She has never used smokeless tobacco. She reports that she does not currently use alcohol. She reports current drug use.  Family History:  Family History  Problem Relation Age of Onset   Hashimoto's thyroiditis Mother    Eczema Mother    Angioedema Mother    Colonic polyp Mother    Throat cancer Father    Arrhythmia Father    Angioedema Maternal Grandfather    Hypertension Other    Hyperlipidemia Other    Colon cancer Other        grandmother     Prior to Admission medications   Medication Sig Start Date End Date Taking? Authorizing Provider  amphetamine-dextroamphetamine (ADDERALL) 20 MG tablet Take 20 mg by mouth 3 (three) times daily.    [provider]  atropine 1 % ophthalmic solution Place 1 drop into the left eye daily as needed (for dry eye).    [provider]  baclofen (LIORESAL) 10 MG tablet Take 10 mg by mouth at bedtime. 02/28/20   [provider]  benzonatate (TESSALON) 100 MG capsule Take 1 capsule (100 mg total) by mouth every 8 (eight) hours as needed for cough. Patient not taking: Reported on 09/14/2021 06/22/21   Barrie Folk, PA-C  Bepotastine Besilate 1.5 % SOLN Place 1 drop into both eyes 2 (two) times a day.    [provider]  cyclobenzaprine (FLEXERIL) 10 MG tablet Take 1 tablet by mouth 3 (three) times daily as needed. 05/05/21   [provider]  cycloSPORINE (RESTASIS) 0.05 % ophthalmic emulsion Place 1 drop into both eyes 2 (two) times daily.    [provider]  dicyclomine (BENTYL) 10 MG capsule Take 10 mg by mouth daily as needed for nausea/vomiting. 01/02/19   [provider]  Dupilumab ([place]) Inject into the skin.    [provider]  famotidine (PEPCID) 40 MG tablet Take 40 mg by mouth at bedtime. 05/02/21   [provider]  FLUCONAZOLE PO Take 300 mg by mouth every Monday, Wednesday, and Friday.    [provider]  folic acid (FOLVITE) 1 MG tablet Take 2 tablets (2 mg total) by mouth daily. 10/30/19   Bo Merino, MD  furosemide (LASIX) 20 MG tablet Take  20 mg by mouth daily as needed for edema or fluid.    [provider]  LORazepam (ATIVAN) 1 MG tablet Take 1 mg by mouth 3 (three) times daily as needed for anxiety. 03/08/20   [provider]  magic mouthwash (nystatin, lidocaine, diphenhydrAMINE, alum & mag hydroxide) suspension Swish and spit 5 mLs 3 (three) times daily as needed for mouth pain. 06/10/21   Truman Hayward, MD  meloxicam (MOBIC) 15 MG tablet Take 1 tablet (15 mg total) by mouth daily. Patient taking differently: Take 15 mg by mouth as needed. 07/25/21   Orpah Greek, MD  metoCLOPramide (REGLAN) 10 MG tablet Take 10 mg by mouth daily.     [provider]  metoprolol succinate (TOPROL-XL) 25 MG 24 hr tablet Take 50 mg by mouth daily.    [provider]  nystatin (MYCOSTATIN) 100000 UNIT/ML suspension Take 10 mLs (1,000,000 Units total) by mouth 3 (three) times daily. 12/07/20   Truman Hayward, MD  nystatin powder Apply 1 application topically 2 (two) times daily as needed (rash).    [provider]  ondansetron (ZOFRAN-ODT) 4 MG disintegrating tablet Take 1  tablet (4 mg total) by mouth every 8 (eight) hours as needed for nausea or vomiting. 06/28/22   Blake Divine, MD  oseltamivir (TAMIFLU) 75 MG capsule Take 1 capsule (75 mg total) by mouth every 12 (twelve) hours. Patient not taking: Reported on 12/31/2021 11/13/21   Raspet, Junie Panning K, PA-C  oxyCODONE (ROXICODONE) 5 MG immediate release tablet Take 1 tablet (5 mg total) by mouth every 8 (eight) hours as needed for breakthrough pain (in addition to your baseline medications). Patient not taking: Reported on 10/04/2022 09/25/22 09/25/23  Duffy [place], MD  oxycodone-acetaminophen (LYNOX) 10-300 MG tablet Take 1 tablet by mouth 3 (three) times daily.    [provider]  oxyCODONE-acetaminophen (PERCOCET/ROXICET) 5-325 MG tablet Take 1 tablet by mouth every 6 (six) hours as needed for moderate pain. Patient not taking: Reported on 10/04/2022 12/28/21   [provider]  pantoprazole (PROTONIX) 40 MG tablet Take 1 tablet (40 mg total) by mouth 2 (two) times daily. 05/02/17 08/04/28  Elgergawy, Silver Huguenin, MD  Polyethyl Glycol-Propyl Glycol (SYSTANE) 0.4-0.3 % GEL ophthalmic gel Place 1 application into both eyes in the morning and at bedtime.    [provider]  potassium chloride (K-DUR) 10 MEQ tablet Take 10 mEq by mouth daily as needed (when taking fursoemide).    [provider]  pravastatin (PRAVACHOL) 40 MG tablet Take 40 mg by mouth at bedtime.    [provider]  prednisoLONE acetate (PRED FORTE) 1 % ophthalmic suspension Place 1 drop into the left eye as needed (conjunctivitis).    [provider]  promethazine (PHENERGAN) 25 MG tablet Take 1 tablet (25 mg total) by mouth every 6 (six) hours as needed for nausea or vomiting. 09/25/22   Duffy [place], MD  sucralfate (CARAFATE) 1 g tablet Take 1 tablet (1 g total) by mouth 4 (four) times daily for 7 days. 09/25/22 10/04/22  Duffy [place], MD  SUMAtriptan (IMITREX) 25 MG tablet Take 25 mg by mouth every 2  (two) hours as needed for migraine. May repeat in 2 hours if headache persists or recurs.    [provider]  topiramate (TOPAMAX) 100 MG tablet Take 100 mg by mouth daily. 06/18/19   [provider]  topiramate (TOPAMAX) 25 MG tablet Take 25 mg by mouth at bedtime.  [provider]  traZODone (DESYREL) 150 MG tablet Take 150 mg by mouth at bedtime as needed for sleep.    [provider]  triamcinolone lotion (KENALOG) 0.1 % Apply 1 application topically daily as needed (rash). 07/07/20   [provider]  valACYclovir (VALTREX) 1000 MG tablet Take 1 tablet (1,000 mg total) by mouth daily. Patient not taking: Reported on 10/04/2022 08/12/20   Truman Hayward, MD    Physical Exam: Vitals:   11/11/22 0424 11/11/22 0621 11/11/22 0830 11/11/22 0913  BP: 119/85  111/81 107/82  Pulse: 94  (!) 107 (!) 103  Resp: (!) 22  19   Temp: 97.9 F (36.6 C) 99.4 F (37.4 C) 98.2 F (36.8 C) 99.4 F (37.4 C)  TempSrc: Oral Rectal Oral Oral  SpO2: 97%  97% 95%  Weight:      Height:       General: Not in acute distress HEENT:       Eyes: PERRL, EOMI, no scleral icterus.       ENT: No discharge from the ears and nose, no pharynx injection, no tonsillar enlargement.        Neck: No JVD, no bruit, no mass felt. Heme: No neck lymph node enlargement. Cardiac: S1/S2, RRR, No murmurs, No gallops or rubs. Respiratory: Has wheezing bilaterally GI: Soft, nondistended, nontender, no rebound pain, no organomegaly, BS present. GU: No hematuria Ext: has 2+  leg edema and chronic lymphedema Musculoskeletal: No joint deformities, No joint redness or warmth, no limitation of ROM in spin. Skin: No rashes.  Neuro: Alert, oriented X3, cranial nerves II-XII grossly intact, moves all extremities normally. Psych: Patient is not psychotic, no suicidal or hemocidal ideation.  Labs on Admission: I have personally reviewed following labs and imaging studies  CBC: Recent  Labs  Lab 11/11/22 0429  WBC 9.2  HGB 14.0  HCT 41.1  MCV 92.2  PLT 638   Basic Metabolic Panel: Recent Labs  Lab 11/11/22 0429  NA 137  K 3.7  CL 98  CO2 30  GLUCOSE 116*  BUN 10  CREATININE 0.90  CALCIUM 10.0   GFR: Estimated Creatinine Clearance: 78.8 mL/min (by C-G formula based on SCr of 0.9 mg/dL). Liver Function Tests: No results for input(s): "AST", "ALT", "ALKPHOS", "BILITOT", "PROT", "ALBUMIN" in the last 168 hours. No results for input(s): "LIPASE", "AMYLASE" in the last 168 hours. No results for input(s): "AMMONIA" in the last 168 hours. Coagulation Profile: No results for input(s): "INR", "PROTIME" in the last 168 hours. Cardiac Enzymes: No results for input(s): "CKTOTAL", "CKMB", "CKMBINDEX", "TROPONINI" in the last 168 hours. BNP (last 3 results) No results for input(s): "PROBNP" in the last 8760 hours. HbA1C: No results for input(s): "HGBA1C" in the last 72 hours. CBG: No results for input(s): "GLUCAP" in the last 168 hours. Lipid Profile: No results for input(s): "CHOL", "HDL", "LDLCALC", "TRIG", "CHOLHDL", "LDLDIRECT" in the last 72 hours. Thyroid Function Tests: No results for input(s): "TSH", "T4TOTAL", "FREET4", "T3FREE", "THYROIDAB" in the last 72 hours. Anemia Panel: No results for input(s): "VITAMINB12", "FOLATE", "FERRITIN", "TIBC", "IRON", "RETICCTPCT" in the last 72 hours. Urine analysis:    Component Value Date/Time   COLORURINE YELLOW (A) 06/28/2022 1212   APPEARANCEUR CLEAR (A) 06/28/2022 1212   LABSPEC 1.010 06/28/2022 1212   PHURINE 7.0 06/28/2022 1212   GLUCOSEU NEGATIVE 06/28/2022 1212   HGBUR NEGATIVE 06/28/2022 1212   HGBUR negative 07/25/2007 [place] 06/28/2022 [place] 06/28/2022 1212  PROTEINUR NEGATIVE 06/28/2022 1212   UROBILINOGEN 1.0 05/27/2009 1500   NITRITE NEGATIVE 06/28/2022 1212   LEUKOCYTESUR SMALL (A) 06/28/2022 1212   Sepsis  Labs: '@LABRCNTIP'$ (procalcitonin:4,lacticidven:4) ) Recent Results (from the past 240 hour(s))  Resp Panel by RT-PCR (Flu A&B, Covid) Anterior Nasal Swab     Status: None   Collection Time: 11/11/22  5:33 AM   Specimen: Anterior Nasal Swab  Result Value Ref Range Status   SARS Coronavirus 2 by RT PCR NEGATIVE NEGATIVE Final    Comment: (NOTE) SARS-CoV-2 target nucleic acids are NOT DETECTED.  The SARS-CoV-2 RNA is generally detectable in upper respiratory specimens during the acute phase of infection. The lowest concentration of SARS-CoV-2 viral copies this assay can detect is 138 copies/mL. A negative result does not preclude SARS-Cov-2 infection and should not be used as the sole basis for treatment or other patient management decisions. A negative result may occur with  improper specimen collection/handling, submission of specimen other than nasopharyngeal swab, presence of viral mutation(s) within the areas targeted by this assay, and inadequate number of viral copies(<138 copies/mL). A negative result must be combined with clinical observations, patient history, and epidemiological information. The expected result is Negative.  Fact Sheet for Patients:  EntrepreneurPulse.com.au  Fact Sheet for Healthcare Providers:  IncredibleEmployment.be  This test is no t yet approved or cleared by the [place] FDA and  has been authorized for detection and/or diagnosis of SARS-CoV-2 by FDA under an Emergency Use Authorization (EUA). This EUA will remain  in effect (meaning this test can be used) for the duration of the COVID-19 declaration under Section 564(b)(1) of the Act, 21 U.S.C.section 360bbb-3(b)(1), unless the authorization is terminated  or revoked sooner.       Influenza A by PCR NEGATIVE NEGATIVE Final   Influenza B by PCR NEGATIVE NEGATIVE Final    Comment: (NOTE) The Xpert Xpress SARS-CoV-2/FLU/RSV plus assay is intended as an  aid in the diagnosis of influenza from Nasopharyngeal swab specimens and should not be used as a sole basis for treatment. Nasal washings and aspirates are unacceptable for Xpert Xpress SARS-CoV-2/FLU/RSV testing.  Fact Sheet for Patients: EntrepreneurPulse.com.au  Fact Sheet for Healthcare Providers: IncredibleEmployment.be  This test is not yet approved or cleared by the [place] FDA and has been authorized for detection and/or diagnosis of SARS-CoV-2 by FDA under an Emergency Use Authorization (EUA). This EUA will remain in effect (meaning this test can be used) for the duration of the COVID-19 declaration under Section 564(b)(1) of the Act, 21 U.S.C. section 360bbb-3(b)(1), unless the authorization is terminated or revoked.  Performed at [facility], [address] [place], [place] [address]   Expectorated Sputum Assessment w Gram Stain, Rflx to Resp Cult     Status: None   Collection Time: 11/11/22  6:58 AM   Specimen: Sputum  Result Value Ref Range Status   Specimen Description SPUTUM  Final   Special Requests NONE  Final   Sputum evaluation   Final    THIS SPECIMEN IS ACCEPTABLE FOR SPUTUM CULTURE Performed at [facility], [address] [place], [place] [address]    Report Status 11/11/2022 FINAL  Final     Radiological Exams on Admission: CT Angio Chest PE W and/or Wo Contrast  Result Date: 11/11/2022 CLINICAL DATA:  46 year old female with history of shortness of breath and congestion. Mid chest pain. EXAM: CT ANGIOGRAPHY CHEST WITH CONTRAST TECHNIQUE: Multidetector CT imaging of the chest was performed using the standard protocol  during bolus administration of intravenous contrast. Multiplanar CT image reconstructions and MIPs were obtained to evaluate the vascular anatomy. RADIATION DOSE REDUCTION: This exam was performed according to the departmental dose-optimization program which includes automated  exposure control, adjustment of the mA and/or kV according to patient size and/or use of iterative reconstruction technique. CONTRAST:  48m OMNIPAQUE IOHEXOL 350 MG/ML SOLN COMPARISON:  CTA chest 09/25/2022. FINDINGS: Comment: Today's study is limited by considerable patient respiratory motion. Cardiovascular: Within the limitations of today's examination, there is no central, lobar or proximal segmental sized filling defect. Distal segmental and subsegmental sized filling defects can not be entirely excluded on today's motion limited examination. Heart size is normal. There is no significant pericardial fluid, thickening or pericardial calcification. No atherosclerotic calcifications are noted in the thoracic aorta or the coronary arteries. Mediastinum/Nodes: No pathologically enlarged mediastinal or hilar lymph nodes. Patulous fluid-filled esophagus. No axillary lymphadenopathy. Lungs/Pleura: No acute consolidative airspace disease. No pleural effusions. No definite suspicious appearing pulmonary nodules or masses are noted on today's motion limited examination. Upper Abdomen: Diffuse low attenuation throughout the visualized hepatic parenchyma, indicative of a background of severe hepatic steatosis. Status post cholecystectomy. Musculoskeletal: Electronic device in the medial aspect of the left breast, presumably an implantable loop recorder. There are no aggressive appearing lytic or blastic lesions noted in the visualized portions of the skeleton. Review of the MIP images confirms the above findings. IMPRESSION: 1. Limited study secondary to patient respiratory motion. No central, lobar or proximal segmental sized pulmonary embolism identified. 2. No other acute findings noted in the thorax to account for the patient's symptoms. 3. Severe hepatic steatosis. Electronically Signed   By: DVinnie LangtonM.D.   On: 11/11/2022 06:28   DG Chest Portable 1 View  Result Date: 11/11/2022 CLINICAL DATA:   46year old female with history of shortness of breath. EXAM: PORTABLE CHEST 1 VIEW COMPARISON:  Chest x-ray 09/25/2022. FINDINGS: Lung volumes are low. No consolidative airspace disease. No pleural effusions. No pneumothorax. No pulmonary nodule or mass noted. Pulmonary vasculature and the cardiomediastinal silhouette are within normal limits. Electronic device projecting over the left hemithorax, presumably an implantable loop recorder. IMPRESSION: 1. Low lung volumes without radiographic evidence of acute cardiopulmonary disease. Electronically Signed   By: DVinnie LangtonM.D.   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 tach and atrial tachycardia   Oral thrush   Obesity with body mass index (BMI) of 30.0 to 39.9   Assessment and Plan:  Acute bronchitis and severe sepsis: Chest x-ray is negative for infiltration.  CTA negative for PE though limited study.  Patient does not have oxygen desaturation.  Patient has heart rate 94, RR 22. Pt meets criteria for sepsis. Lactic acid is 3.8 --> 4.9.  Procalcitonin 0.27.  Patient is immunosuppressed, will need broad antibiotics.   - Place in tele bed for obs - IV Vancomycin and cefepime due to immunocompromised status (patient received 1 dose of Rocephin and azithromycin in ED) - Solu-Medrol 40 mg twice daily - Mucinex for cough  - Bronchodilators - Urine S. pneumococcal antigen - Follow up blood culture x2, sputum culture - Check respiratory virus panel -  trend lactic acid level - IVF: 500 cc of NS, then 75 cc/h (will not give aggressive IV fluid due to severe lymphedema)  Specific antibody  deficiency with normal immunoglobulin concentration and normal number of B cells, hx of RA (rheumatoid arthritis): currently pt is taking Dupixent once a week, last dose was on 11/10 -f/u with immunologist in [place] (hyperlipidemia) -Pravastatin  Lymphedema -Hold home Lasix due to elevated lactic acid level and sepsis.  Anxiety state -As needed Ativan  Migraine -As needed sumatriptan -Continue home Topamax for prophylaxis  Eosinophilic esophagitis -Pepcid and Carafate  Cardiac arrhythmia_nonsustained V. tach and atrial tachycardia -Patient has loop recorder in place -Continue metoprolol  Oral thrush -Continue home fluconazole and nystatin  Obesity with body mass index (BMI) of 30.0 to 39.9: BMI= 30.73   and BW= 81.2 kg -Diet and exercise.   -Encourage to lose weight.      DVT ppx: SQ Lovenox  Code Status: Full code  Family Communication:   Yes, patient's husband    at bed side.    Disposition Plan:  Anticipate discharge back to previous environment  Consults called:  none  Admission status and Level of care: Telemetry Medical:  for obs   Dispo: The patient is from: Home              Anticipated d/c is to: Home              Anticipated d/c date is: 1 day              Patient currently is not medically stable to d/c.    Severity of Illness:  The appropriate patient status for this patient is OBSERVATION. Observation status is judged to be reasonable and necessary in order to provide the required intensity of service to ensure the patient's safety. The patient's presenting symptoms, physical exam findings, and initial radiographic and laboratory data in the context of their medical condition is felt to place them at decreased risk for further clinical deterioration. Furthermore, it is anticipated that the patient will be medically stable for discharge from the hospital within 2 midnights of admission.        Date of Service 11/11/2022    [facility]   If 7PM-7AM, please contact night-coverage [email or web address] 11/11/2022, 10:45 AM

## 2022-11-12 DIAGNOSIS — A419 Sepsis, unspecified organism: Secondary | ICD-10-CM

## 2022-11-12 DIAGNOSIS — M059 Rheumatoid arthritis with rheumatoid factor, unspecified: Secondary | ICD-10-CM

## 2022-11-12 DIAGNOSIS — I890 Lymphedema, not elsewhere classified: Secondary | ICD-10-CM | POA: Diagnosis present

## 2022-11-12 DIAGNOSIS — Z8616 Personal history of COVID-19: Secondary | ICD-10-CM | POA: Diagnosis not present

## 2022-11-12 DIAGNOSIS — E785 Hyperlipidemia, unspecified: Secondary | ICD-10-CM | POA: Diagnosis present

## 2022-11-12 DIAGNOSIS — Z8542 Personal history of malignant neoplasm of other parts of uterus: Secondary | ICD-10-CM | POA: Diagnosis not present

## 2022-11-12 DIAGNOSIS — B370 Candidal stomatitis: Secondary | ICD-10-CM | POA: Diagnosis present

## 2022-11-12 DIAGNOSIS — F411 Generalized anxiety disorder: Secondary | ICD-10-CM

## 2022-11-12 DIAGNOSIS — R6520 Severe sepsis without septic shock: Secondary | ICD-10-CM

## 2022-11-12 DIAGNOSIS — E782 Mixed hyperlipidemia: Secondary | ICD-10-CM

## 2022-11-12 DIAGNOSIS — I499 Cardiac arrhythmia, unspecified: Secondary | ICD-10-CM

## 2022-11-12 DIAGNOSIS — Z923 Personal history of irradiation: Secondary | ICD-10-CM | POA: Diagnosis not present

## 2022-11-12 DIAGNOSIS — Z79899 Other long term (current) drug therapy: Secondary | ICD-10-CM | POA: Diagnosis not present

## 2022-11-12 DIAGNOSIS — Z8249 Family history of ischemic heart disease and other diseases of the circulatory system: Secondary | ICD-10-CM | POA: Diagnosis not present

## 2022-11-12 DIAGNOSIS — T380X5A Adverse effect of glucocorticoids and synthetic analogues, initial encounter: Secondary | ICD-10-CM | POA: Diagnosis present

## 2022-11-12 DIAGNOSIS — M069 Rheumatoid arthritis, unspecified: Secondary | ICD-10-CM | POA: Diagnosis present

## 2022-11-12 DIAGNOSIS — E876 Hypokalemia: Secondary | ICD-10-CM | POA: Diagnosis present

## 2022-11-12 DIAGNOSIS — J398 Other specified diseases of upper respiratory tract: Secondary | ICD-10-CM | POA: Diagnosis not present

## 2022-11-12 DIAGNOSIS — K200 Eosinophilic esophagitis: Secondary | ICD-10-CM

## 2022-11-12 DIAGNOSIS — I959 Hypotension, unspecified: Secondary | ICD-10-CM | POA: Diagnosis present

## 2022-11-12 DIAGNOSIS — I472 Ventricular tachycardia, unspecified: Secondary | ICD-10-CM | POA: Diagnosis present

## 2022-11-12 DIAGNOSIS — D849 Immunodeficiency, unspecified: Secondary | ICD-10-CM | POA: Diagnosis present

## 2022-11-12 DIAGNOSIS — J209 Acute bronchitis, unspecified: Secondary | ICD-10-CM | POA: Diagnosis present

## 2022-11-12 DIAGNOSIS — E669 Obesity, unspecified: Secondary | ICD-10-CM | POA: Diagnosis present

## 2022-11-12 DIAGNOSIS — G894 Chronic pain syndrome: Secondary | ICD-10-CM | POA: Diagnosis present

## 2022-11-12 DIAGNOSIS — I4719 Other supraventricular tachycardia: Secondary | ICD-10-CM | POA: Diagnosis present

## 2022-11-12 DIAGNOSIS — G43909 Migraine, unspecified, not intractable, without status migrainosus: Secondary | ICD-10-CM | POA: Diagnosis not present

## 2022-11-12 DIAGNOSIS — Z86718 Personal history of other venous thrombosis and embolism: Secondary | ICD-10-CM | POA: Diagnosis not present

## 2022-11-12 DIAGNOSIS — Z20822 Contact with and (suspected) exposure to covid-19: Secondary | ICD-10-CM | POA: Diagnosis present

## 2022-11-12 LAB — BASIC METABOLIC PANEL
Anion gap: 8 (ref 5–15)
BUN: 22 mg/dL — ABNORMAL HIGH (ref 6–20)
CO2: 23 mmol/L (ref 22–32)
Calcium: 8.8 mg/dL — ABNORMAL LOW (ref 8.9–10.3)
Chloride: 106 mmol/L (ref 98–111)
Creatinine, Ser: 1.01 mg/dL — ABNORMAL HIGH (ref 0.44–1.00)
GFR, Estimated: >60 mL/min (ref >60)
Glucose, Bld: 164 mg/dL — ABNORMAL HIGH (ref 70–99)
Potassium: 4.4 mmol/L (ref 3.5–5.1)
Sodium: 137 mmol/L (ref 135–145)

## 2022-11-12 LAB — CBC
HCT: 33.6 % — ABNORMAL LOW (ref 36.0–46.0)
Hemoglobin: 11.3 g/dL — ABNORMAL LOW (ref 12.0–15.0)
MCH: 31.6 pg (ref 26.0–34.0)
MCHC: 33.6 g/dL (ref 30.0–36.0)
MCV: 93.9 fL (ref 80.0–100.0)
Platelets: 271 10*3/uL (ref 150–400)
RBC: 3.58 MIL/uL — ABNORMAL LOW (ref 3.87–5.11)
RDW: 13.5 % (ref 11.5–15.5)
WBC: 18 10*3/uL — ABNORMAL HIGH (ref 4.0–10.5)
nRBC: 0 % (ref 0.0–0.2)

## 2022-11-12 LAB — LACTIC ACID, PLASMA: Lactic Acid, Venous: 3.7 mmol/L (ref 0.5–1.9)

## 2022-11-12 LAB — MRSA NEXT GEN BY PCR, NASAL: MRSA by PCR Next Gen: NOT DETECTED

## 2022-11-12 MED ORDER — LIDOCAINE VISCOUS HCL 2 % MT SOLN
15.0000 mL | OROMUCOSAL | Status: DC | PRN
  Administered 2022-11-12 – 2022-11-16 (×3): 15 mL via OROMUCOSAL
  Filled 2022-11-12 (×4): qty 15

## 2022-11-12 MED ORDER — MENTHOL 3 MG MT LOZG
1.0000 | LOZENGE | OROMUCOSAL | Status: DC | PRN
  Administered 2022-11-12 – 2022-11-13 (×3): 3 mg via ORAL
  Filled 2022-11-12 (×4): qty 9

## 2022-11-12 MED ORDER — SALINE SPRAY 0.65 % NA SOLN
1.0000 | NASAL | Status: DC | PRN
  Administered 2022-11-12 – 2022-11-20 (×5): 1 via NASAL
  Filled 2022-11-12: qty 44

## 2022-11-12 NOTE — Hospital Course (Addendum)
[name] Horn is a 46 y.o. 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 tach and atrial tachycardia, has loop recorder in place), obesity with body mass index (BMI) of 30.73, oral thrush, who presents to ED from home on 11/11/2022 with SOB x1 day assoc w/ cough, wheeze, subjective f/c, pleuritic CP. Patient used to take methotrexate, prednisone and IVIG for compromised immune system.  Due to oral thrush, her immunologist at the [place] hold off her methotrexate, prednisone and IVIG. She's also following w/ [facility] rheum. Currently patient is taking Dupixent once a week.  Last dose was on 11/10.  11/17:  WBC 9.2, troponin level 3, BNP 15, negative COVID PCR, negative flu a and B, electrolytes renal function okay, temperature 99.4, blood pressure 119/85, heart rate 92, RR 22, oxygen saturation 97% on room air.  Chest x-ray showed lower lobe lung volume without infiltration.  CTA is limited study, negative for PE. Received 1 dose of Rocephin and azithromycin in ED. Patient was placed on telemetry bed for observation. Started on IV Vancomycin and cefepime due to immunocompromised status, as well as IV Solu-Medrol, gentle IV fluids given meeting sepsis criteria. Respiraoty viral panel negative. Resp/Sputum Cx and BCx pending.  11/18: VSS, remains on room air but feels more comfortable on O2. SEPSIS CRITERIA RESOLVED. No concerns on AM labs, elevated WBC likely d/t IV steroids. Lactate trending down. Awaiting resp/sputum Cx and BCx. 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  11/19: still significant coughing w/ sputum, adding Mucomyst. Lactate trending down but still elevated at 2.2. WBC  improving but still elevated at 15.6. BCx NGx2d. Resp Cx reincubated for better growth. Sputum Cx pending. Increased nebulizers and added Mucomyst  11/20-11/21: breathing a bit better w/ the mucomyst and lung exam slightly improved but still significant productive cough.  11/21: Given no significant improvement and not needing O2, consulted pulmonary to see if they have any input re: abx, appropriate discharge Rx regimen, question need for further imaging?   Consultants:  Pulmonology   Procedures: none  Antibiotics:  11/17: azithromycin x1 dose in ED, ceftriaxone x1 dose in ED, vancomycin in ED.  Continue fluconazole three days per week for resistant thrush. Started cefepime and continued vanc once admitted.  11/18:  received vancomycin and continued cefepime.  11/19: no vanc. 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 tach and atrial tachycardia   Oral thrush   Obesity with body mass index (BMI) of 30.0 to 39.9  Acute bronchitis and severe sepsis/SIRS - sepsis resolved:  -Abx as above, currently on amox-clav only, today is day 5 total abx  -Solu-Medrol 40 mg twice daily to consider taper on discharge  -Mucinex for cough  -Bronchodilators frequency increased 11/19 -added Mucomyst 11/19 and pt reports improvement w/ this  -incentive spirometry and flutter valve  -Urine S. pneumococcal antigen --> negative   -  Follow up blood culture x2 --> NG x2d -Follow sputum/respiratory culture --> rare staph aureus pan-sensitive  -Check respiratory virus panel --> negative  -trend lactic  acid level --> improved  -Given no significant improvement and not needing O2, consulted pulmonary to see if they have any input re: abx, appropriate discharge Rx regimen, question need for further imaging?    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   Anxiety state -As needed Ativan   Migraine -As needed sumatriptan -Continue home Topamax for prophylaxis   Eosinophilic esophagitis -Pepcid and Carafate   Cardiac arrhythmia_nonsustained V. tach and atrial tachycardia -Patient has loop recorder in place -Continue metoprolol   Oral thrush -Continue home fluconazole and nystatin   Obesity with body mass index (BMI) of 30.0 to 39.9: BMI= 30.73   and BW= 81.2 kg -Diet and exercise.   -Encourage to lose weight.    DVT prophylaxis: enoxaparin Pertinent IV fluids/nutrition: gentl IV fluids w/ NS --> plan d/c likely tomorrow  Central lines / invasive devices: none  Code Status: FULL CODE  Disposition: inpatient  TOC needs: none at this time Barriers to discharge / significant pending items: await improvement in respiratory status, anticipate d/c in 1-2 days

## 2022-11-12 NOTE — Progress Notes (Signed)
PROGRESS NOTE    [name]   UMP:[id number] DOB: [birth year]  DOA: 11/11/2022 Date of Service: 11/12/22 PCP: Alan Ripper, [place]     Brief Narrative / Hospital Course:  [name] Horn is a 46 y.o. 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 tach and atrial tachycardia, has loop recorder in place), obesity with body mass index (BMI) of 30.73, oral thrush, who presents to ED from home on 11/11/2022 with SOB x1 day assoc w/ cough, wheeze, subjective f/c, pleuritic CP. Patient used to take methotrexate, prednisone and IVIG for compromised immune system.  Due to oral thrush, her immunologist at the [facility] hold off her methotrexate, prednisone and IVIG.  Currently patient is taking Dupixent once a week.  Last dose was on 11/10.  11/17:  WBC 9.2, troponin level 3, BNP 15, negative COVID PCR, negative flu a and B, electrolytes renal function okay, temperature 99.4, blood pressure 119/85, heart rate 92, RR 22, oxygen saturation 97% on room air.  Chest x-ray showed lower lobe lung volume without infiltration.  CTA is limited study, negative for PE. Received 1 dose of Rocephin and azithromycin in ED. Patient was placed on telemetry bed for observation. Started on IV Vancomycin and cefepime due to immunocompromised status, as well as IV Solu-Medrol, gentle IV fluids given meeting sepsis criteria. Respiraoty viral panel negative. Resp/Sputum Cx and BCx pending.  11/18: VSS, remains on room air but feels more comfortable on O2. SEPSIS CRITERIA RESOLVED. No concerns on AM labs, elevated WBC likely d/t IV steroids. Lactate trending down. Awaiting resp/sputum Cx and BCx. Reprots chronic resistant thrush s/p bx w/ ENT at [facility], will maintain current antifungals and add pain  control w viscous lidocaine and cepacol.   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 tach and atrial tachycardia   Oral thrush   Obesity with body mass index (BMI) of 30.0 to 39.9  Acute bronchitis and severe sepsis - sepsis resolved:  - Place in tele bed for obs --> may need to change to inpatient - IV Vancomycin and cefepime due to immunocompromised status --> today 11/12/22 is Day 2 abx.  - Solu-Medrol 40 mg twice daily - Mucinex for cough  - Bronchodilators - Urine S. 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   Anxiety state -As needed Ativan   Migraine -  As needed sumatriptan -Continue home Topamax for prophylaxis   Eosinophilic esophagitis -Pepcid and Carafate   Cardiac arrhythmia_nonsustained V. tach and atrial tachycardia -Patient has loop recorder in place -Continue metoprolol   Oral thrush -Continue home fluconazole and nystatin   Obesity with body mass index (BMI) of  30.0 to 39.9: BMI= 30.73   and BW= 81.2 kg -Diet and exercise.   -Encourage to lose weight.    DVT prophylaxis: enoxaparin Pertinent IV fluids/nutrition: gentl IV fluids w/ NS --> plan d/c likely tomorrow  Central lines / invasive devices: none  Code Status: FULL CODE  Disposition: currently obs but needing IV abx awiting cultures, may be here another midnight?  TOC needs: none at this time Barriers to discharge / significant pending items: culture results in immune compromised patient prior to determining final abx course              Subjective:  Patient reports still coughing and productive of yellow sputum Denies CP. Reports mild SOB.  Pain controlled other than some mouth pain which is stable  Denies new weakness.  Tolerating diet.  Reports no concerns w/ urination/defecation.   Family Communication: support person at bedside on rounds     Objective Findings:  Vitals:   11/11/22 2100 11/12/22 0105 11/12/22 0611 11/12/22 0844  BP: 108/66 (!) 113/97 100/61 103/72  Pulse: 100 97 95 99  Resp: '20 14 18 19  '$ Temp: 98 F (36.7 C) 98.3 F (36.8 C) 98.2 F (36.8 C) 98.6 F (37 C)  TempSrc:   Oral   SpO2: 96% 94% 93% 93%  Weight:      Height:        Intake/Output Summary (Last 24 hours) at 11/12/2022 1319 Last data filed at 11/12/2022 0100 Gross per 24 hour  Intake 304.88 ml  Output 1800 ml  Net -1495.12 ml   Filed Weights   11/11/22 0422  Weight: 81.2 kg    Examination: Constitutional:  VS as above General Appearance: alert, well-developed, well-nourished, NAD Eyes: Normal lids and conjunctive, non-icteric sclera Ears, Nose, Mouth, Throat: Normal external appearance MMM Neck: No masses, trachea midline Respiratory: Normal respiratory effort +scattered moderate wheeze No rhonchi No rales Cardiovascular: S1/S2 normal No murmur No rub/gallop auscultated No JVD Gastrointestinal: No tenderness No masses No hernia  appreciated Musculoskeletal:  No clubbing/cyanosis of digits Symmetrical movement in all extremities Neurological: No cranial nerve deficit on limited exam Alert Psychiatric: Normal judgment/insight Normal mood and affect       Scheduled Medications:   baclofen  10 mg Oral QHS   cycloSPORINE  1 drop Both Eyes BID   enoxaparin (LOVENOX) injection  40 mg Subcutaneous Q24H   famotidine  40 mg Oral QHS   fluconazole  300 mg Oral Q M,W,F   folic acid  2 mg Oral Daily   ipratropium-albuterol  3 mL Nebulization Q6H   methylPREDNISolone (SOLU-MEDROL) injection  125 mg Intravenous Q12H   metoCLOPramide  10 mg Oral BID   metoprolol succinate  50 mg Oral Daily   nystatin  10 mL Oral TID   olopatadine  1 drop Both Eyes BID   pantoprazole  40 mg Oral BID   polyvinyl alcohol  1 drop Both Eyes BID   pravastatin  40 mg Oral QHS   sucralfate  1 g Oral QID   topiramate  100 mg Oral Daily   topiramate  25 mg Oral QHS    Continuous Infusions:  sodium chloride Stopped (11/11/22 1242)   sodium chloride  75 mL/hr at 11/12/22 0720   ceFEPime (MAXIPIME) IV 2 g (11/12/22 0543)   vancomycin 1,750 mg (11/12/22 0917)    PRN Medications:  sodium chloride, acetaminophen, albuterol, alum & mag hydroxide-simeth, amphetamine-dextroamphetamine, atropine, chlorpheniramine-HYDROcodone, cyclobenzaprine, dextromethorphan-guaiFENesin, dicyclomine, lidocaine, LORazepam, meloxicam, menthol-cetylpyridinium, nystatin, ondansetron (ZOFRAN) IV, oxyCODONE-acetaminophen **AND** oxyCODONE, sodium chloride, SUMAtriptan, traZODone, triamcinolone cream  Antimicrobials:  Anti-infectives (From admission, onward)    Start     Dose/Rate Route Frequency Ordered Stop   11/12/22 1000  vancomycin (VANCOREADY) IVPB 1750 mg/350 mL        1,750 mg 175 mL/hr over 120 Minutes Intravenous Every 24 hours 11/11/22 1433     11/12/22 0700  cefTRIAXone (ROCEPHIN) 2 g in sodium chloride 0.9 % 100 mL IVPB  Status:  Discontinued         2 g 200 mL/hr over 30 Minutes Intravenous Every 24 hours 11/11/22 0739 11/11/22 0849   11/12/22 0700  azithromycin (ZITHROMAX) 500 mg in sodium chloride 0.9 % 250 mL IVPB  Status:  Discontinued        500 mg 250 mL/hr over 60 Minutes Intravenous Every 24 hours 11/11/22 0739 11/11/22 0849   11/11/22 1400  ceFEPIme (MAXIPIME) 2 g in sodium chloride 0.9 % 100 mL IVPB        2 g 200 mL/hr over 30 Minutes Intravenous Every 8 hours 11/11/22 0910     11/11/22 1000  fluconazole (DIFLUCAN) tablet 300 mg        300 mg Oral Every M-W-F 11/11/22 0843     11/11/22 1000  vancomycin (VANCOREADY) IVPB 1750 mg/350 mL        1,750 mg 175 mL/hr over 120 Minutes Intravenous  Once 11/11/22 0908 11/11/22 1201   11/11/22 0700  cefTRIAXone (ROCEPHIN) 2 g in sodium chloride 0.9 % 100 mL IVPB        2 g 200 mL/hr over 30 Minutes Intravenous  Once 11/11/22 0646 11/11/22 0733   11/11/22 0700  azithromycin (ZITHROMAX) 500 mg in sodium chloride 0.9 % 250 mL IVPB        500 mg 250 mL/hr over 60 Minutes Intravenous  Once 11/11/22 0646 11/11/22 0757           Data Reviewed: I have personally reviewed following labs and imaging studies  CBC: Recent Labs  Lab 11/11/22 0429 11/12/22 0556  WBC 9.2 18.0*  HGB 14.0 11.3*  HCT 41.1 33.6*  MCV 92.2 93.9  PLT 193 062   Basic Metabolic Panel: Recent Labs  Lab 11/11/22 0429 11/12/22 0556  NA 137 137  K 3.7 4.4  CL 98 106  CO2 30 23  GLUCOSE 116* 164*  BUN 10 22*  CREATININE 0.90 1.01*  CALCIUM 10.0 8.8*   GFR: Estimated Creatinine Clearance: 70.2 mL/min (A) (by C-G formula based on SCr of 1.01 mg/dL (H)). Liver Function Tests: No results for input(s): "AST", "ALT", "ALKPHOS", "BILITOT", "PROT", "ALBUMIN" in the last 168 hours. No results for input(s): "LIPASE", "AMYLASE" in the last 168 hours. No results for input(s): "AMMONIA" in the last 168 hours. Coagulation Profile: No results for input(s): "INR", "PROTIME" in the last 168 hours. Cardiac  Enzymes: No results for input(s): "CKTOTAL", "CKMB", "CKMBINDEX", "TROPONINI" in the last 168 hours. BNP (last 3 results) No results for input(s): "PROBNP" in the last 8760 hours. HbA1C: No results for input(s): "HGBA1C" in the last 72 hours. CBG: No results for input(s): "GLUCAP" in the last 168 hours. Lipid Profile: No results for input(s): "CHOL", "HDL", "LDLCALC", "TRIG", "  CHOLHDL", "LDLDIRECT" in the last 72 hours. Thyroid Function Tests: No results for input(s): "TSH", "T4TOTAL", "FREET4", "T3FREE", "THYROIDAB" in the last 72 hours. Anemia Panel: No results for input(s): "VITAMINB12", "FOLATE", "FERRITIN", "TIBC", "IRON", "RETICCTPCT" in the last 72 hours. Most Recent Urinalysis On File:     Component Value Date/Time   COLORURINE YELLOW (A) 06/28/2022 1212   APPEARANCEUR CLEAR (A) 06/28/2022 1212   LABSPEC 1.010 06/28/2022 1212   PHURINE 7.0 06/28/2022 1212   GLUCOSEU NEGATIVE 06/28/2022 1212   HGBUR NEGATIVE 06/28/2022 1212   HGBUR negative 07/25/2007 [place] 06/28/2022 1212   KETONESUR NEGATIVE 06/28/2022 1212   PROTEINUR NEGATIVE 06/28/2022 1212   UROBILINOGEN 1.0 05/27/2009 1500   NITRITE NEGATIVE 06/28/2022 1212   LEUKOCYTESUR SMALL (A) 06/28/2022 1212   Sepsis Labs: '@LABRCNTIP'$ (procalcitonin:4,lacticidven:4)  Recent Results (from the past 240 hour(s))  Resp Panel by RT-PCR (Flu A&B, Covid) Anterior Nasal Swab     Status: None   Collection Time: 11/11/22  5:33 AM   Specimen: Anterior Nasal Swab  Result Value Ref Range Status   SARS Coronavirus 2 by RT PCR NEGATIVE NEGATIVE Final    Comment: (NOTE) SARS-CoV-2 target nucleic acids are NOT DETECTED.  The SARS-CoV-2 RNA is generally detectable in upper respiratory specimens during the acute phase of infection. The lowest concentration of SARS-CoV-2 viral copies this assay can detect is 138 copies/mL. A negative result does not preclude SARS-Cov-2 infection and should not be used as the sole  basis for treatment or other patient management decisions. A negative result may occur with  improper specimen collection/handling, submission of specimen other than nasopharyngeal swab, presence of viral mutation(s) within the areas targeted by this assay, and inadequate number of viral copies(<138 copies/mL). A negative result must be combined with clinical observations, patient history, and epidemiological information. The expected result is Negative.  Fact Sheet for Patients:  EntrepreneurPulse.com.au  Fact Sheet for Healthcare Providers:  IncredibleEmployment.be  This test is no t yet approved or cleared by the [place] FDA and  has been authorized for detection and/or diagnosis of SARS-CoV-2 by FDA under an Emergency Use Authorization (EUA). This EUA will remain  in effect (meaning this test can be used) for the duration of the COVID-19 declaration under Section 564(b)(1) of the Act, 21 U.S.C.section 360bbb-3(b)(1), unless the authorization is terminated  or revoked sooner.       Influenza A by PCR NEGATIVE NEGATIVE Final   Influenza B by PCR NEGATIVE NEGATIVE Final    Comment: (NOTE) The Xpert Xpress SARS-CoV-2/FLU/RSV plus assay is intended as an aid in the diagnosis of influenza from Nasopharyngeal swab specimens and should not be used as a sole basis for treatment. Nasal washings and aspirates are unacceptable for Xpert Xpress SARS-CoV-2/FLU/RSV testing.  Fact Sheet for Patients: EntrepreneurPulse.com.au  Fact Sheet for Healthcare Providers: IncredibleEmployment.be  This test is not yet approved or cleared by the [place] FDA and has been authorized for detection and/or diagnosis of SARS-CoV-2 by FDA under an Emergency Use Authorization (EUA). This EUA will remain in effect (meaning this test can be used) for the duration of the COVID-19 declaration under Section 564(b)(1) of the Act,  21 U.S.C. section 360bbb-3(b)(1), unless the authorization is terminated or revoked.  Performed at [facility], [place], [place] [address]   Respiratory (~20 pathogens) panel by PCR     Status: None   Collection Time: 11/11/22  5:33 AM   Specimen: Anterior Nasal Swab; Respiratory  Result Value  Ref Range Status   Adenovirus NOT DETECTED NOT DETECTED Final   Coronavirus 229E NOT DETECTED NOT DETECTED Final    Comment: (NOTE) The Coronavirus on the Respiratory Panel, DOES NOT test for the novel  Coronavirus (2019 nCoV)    Coronavirus HKU1 NOT DETECTED NOT DETECTED Final   Coronavirus NL63 NOT DETECTED NOT DETECTED Final   Coronavirus OC43 NOT DETECTED NOT DETECTED Final   Metapneumovirus NOT DETECTED NOT DETECTED Final   Rhinovirus / Enterovirus NOT DETECTED NOT DETECTED Final   Influenza A NOT DETECTED NOT DETECTED Final   Influenza B NOT DETECTED NOT DETECTED Final   Parainfluenza Virus 1 NOT DETECTED NOT DETECTED Final   Parainfluenza Virus 2 NOT DETECTED NOT DETECTED Final   Parainfluenza Virus 3 NOT DETECTED NOT DETECTED Final   Parainfluenza Virus 4 NOT DETECTED NOT DETECTED Final   Respiratory Syncytial Virus NOT DETECTED NOT DETECTED Final   Bordetella pertussis NOT DETECTED NOT DETECTED Final   Bordetella Parapertussis NOT DETECTED NOT DETECTED Final   Chlamydophila pneumoniae NOT DETECTED NOT DETECTED Final   Mycoplasma pneumoniae NOT DETECTED NOT DETECTED Final    Comment: Performed at [facility], [place] [address] [place], [place] [address]  Expectorated Sputum Assessment w Gram Stain, Rflx to Resp Cult     Status: None   Collection Time: 11/11/22  6:58 AM   Specimen: Sputum  Result Value Ref Range Status   Specimen Description SPUTUM  Final   Special Requests NONE  Final   Sputum evaluation   Final    THIS SPECIMEN IS ACCEPTABLE FOR SPUTUM CULTURE Performed at [facility], [address] [place], [place] [address]     Report Status 11/11/2022 FINAL  Final  Culture, Respiratory w Gram Stain     Status: None (Preliminary result)   Collection Time: 11/11/22  6:58 AM   Specimen: SPU  Result Value Ref Range Status   Specimen Description   Final    SPUTUM Performed at [facility], [address] [place], [place] [address]    Special Requests   Final    NONE Reflexed from [phone] Performed at [facility], [address] [place], [place] [address]    Gram Stain   Final    NO WBC SEEN FEW GRAM POSITIVE COCCI IN PAIRS Performed at [facility], [place] [address], [place], [place] [address]    Culture PENDING  Incomplete   Report Status PENDING  Incomplete  Culture, blood (x 2)     Status: None (Preliminary result)   Collection Time: 11/11/22  8:09 AM   Specimen: BLOOD RIGHT ARM  Result Value Ref Range Status   Specimen Description BLOOD RIGHT ARM  Final   Special Requests   Final    BOTTLES DRAWN AEROBIC AND ANAEROBIC Blood Culture adequate volume   Culture   Final    NO GROWTH < 24 HOURS Performed at [facility], [address], [place], [place] [address]    Report Status PENDING  Incomplete  Culture, blood (x 2)     Status: None (Preliminary result)   Collection Time: 11/11/22  9:15 AM   Specimen: BLOOD RIGHT FOREARM  Result Value Ref Range Status   Specimen Description BLOOD RIGHT FOREARM  Final   Special Requests   Final    BOTTLES DRAWN AEROBIC AND ANAEROBIC Blood Culture adequate volume   Culture   Final    NO GROWTH < 24 HOURS Performed at [facility],  [place], [place] [address]    Report Status PENDING  Incomplete         Radiology Studies: CT Angio Chest PE W and/or Wo Contrast  Result Date: 11/11/2022 CLINICAL DATA:  46 year old female with history of shortness of breath and congestion. Mid chest pain. EXAM: CT ANGIOGRAPHY CHEST WITH CONTRAST TECHNIQUE: Multidetector CT imaging of the chest was performed using the  standard protocol during bolus administration of intravenous contrast. Multiplanar CT image reconstructions and MIPs were obtained to evaluate the vascular anatomy. RADIATION DOSE REDUCTION: This exam was performed according to the departmental dose-optimization program which includes automated exposure control, adjustment of the mA and/or kV according to patient size and/or use of iterative reconstruction technique. CONTRAST:  62m OMNIPAQUE IOHEXOL 350 MG/ML SOLN COMPARISON:  CTA chest 09/25/2022. FINDINGS: Comment: Today's study is limited by considerable patient respiratory motion. Cardiovascular: Within the limitations of today's examination, there is no central, lobar or proximal segmental sized filling defect. Distal segmental and subsegmental sized filling defects can not be entirely excluded on today's motion limited examination. Heart size is normal. There is no significant pericardial fluid, thickening or pericardial calcification. No atherosclerotic calcifications are noted in the thoracic aorta or the coronary arteries. Mediastinum/Nodes: No pathologically enlarged mediastinal or hilar lymph nodes. Patulous fluid-filled esophagus. No axillary lymphadenopathy. Lungs/Pleura: No acute consolidative airspace disease. No pleural effusions. No definite suspicious appearing pulmonary nodules or masses are noted on today's motion limited examination. Upper Abdomen: Diffuse low attenuation throughout the visualized hepatic parenchyma, indicative of a background of severe hepatic steatosis. Status post cholecystectomy. Musculoskeletal: Electronic device in the medial aspect of the left breast, presumably an implantable loop recorder. There are no aggressive appearing lytic or blastic lesions noted in the visualized portions of the skeleton. Review of the MIP images confirms the above findings. IMPRESSION: 1. Limited study secondary to patient respiratory motion. No central, lobar or proximal segmental sized  pulmonary embolism identified. 2. No other acute findings noted in the thorax to account for the patient's symptoms. 3. Severe hepatic steatosis. Electronically Signed   By: DVinnie LangtonM.D.   On: 11/11/2022 06:28   DG Chest Portable 1 View  Result Date: 11/11/2022 CLINICAL DATA:  46year old female with history of shortness of breath. EXAM: PORTABLE CHEST 1 VIEW COMPARISON:  Chest x-ray 09/25/2022. FINDINGS: Lung volumes are low. No consolidative airspace disease. No pleural effusions. No pneumothorax. No pulmonary nodule or mass noted. Pulmonary vasculature and the cardiomediastinal silhouette are within normal limits. Electronic device projecting over the left hemithorax, presumably an implantable loop recorder. IMPRESSION: 1. Low lung volumes without radiographic evidence of acute cardiopulmonary disease. Electronically Signed   By: DVinnie LangtonM.D.   On: 11/11/2022 05:05            LOS: 0 days     NEmeterio Reeve DO [facility] 11/12/2022, 1:19 PM    Dictation software may have been used to generate the above note. Typos may occur and escape review in typed/dictated notes. Please contact Dr ASheppard Coildirectly for clarity if needed.  Staff may message me via secure chat in EDover but this may not receive an immediate response,  please page me for urgent matters!  If 7PM-7AM, please contact night coverage [email or web address]

## 2022-11-12 NOTE — Plan of Care (Signed)
  Problem: Clinical Measurements: Goal: Ability to maintain clinical measurements within normal limits will improve Outcome: Progressing   Problem: Coping: Goal: Level of anxiety will decrease Outcome: Progressing   Problem: Elimination: Goal: Will not experience complications related to bowel motility Outcome: Progressing Goal: Will not experience complications related to urinary retention Outcome: Progressing   Problem: Safety: Goal: Ability to remain free from injury will improve Outcome: Progressing

## 2022-11-13 DIAGNOSIS — G43909 Migraine, unspecified, not intractable, without status migrainosus: Secondary | ICD-10-CM | POA: Diagnosis not present

## 2022-11-13 DIAGNOSIS — J209 Acute bronchitis, unspecified: Secondary | ICD-10-CM | POA: Diagnosis not present

## 2022-11-13 DIAGNOSIS — E782 Mixed hyperlipidemia: Secondary | ICD-10-CM | POA: Diagnosis not present

## 2022-11-13 DIAGNOSIS — M059 Rheumatoid arthritis with rheumatoid factor, unspecified: Secondary | ICD-10-CM | POA: Diagnosis not present

## 2022-11-13 LAB — BASIC METABOLIC PANEL
Anion gap: 5 (ref 5–15)
BUN: 20 mg/dL (ref 6–20)
CO2: 23 mmol/L (ref 22–32)
Calcium: 8.7 mg/dL — ABNORMAL LOW (ref 8.9–10.3)
Chloride: 111 mmol/L (ref 98–111)
Creatinine, Ser: 0.79 mg/dL (ref 0.44–1.00)
GFR, Estimated: >60 mL/min (ref >60)
Glucose, Bld: 143 mg/dL — ABNORMAL HIGH (ref 70–99)
Potassium: 4.2 mmol/L (ref 3.5–5.1)
Sodium: 139 mmol/L (ref 135–145)

## 2022-11-13 LAB — CBC
HCT: 31.6 % — ABNORMAL LOW (ref 36.0–46.0)
Hemoglobin: 10.5 g/dL — ABNORMAL LOW (ref 12.0–15.0)
MCH: 31.4 pg (ref 26.0–34.0)
MCHC: 33.2 g/dL (ref 30.0–36.0)
MCV: 94.6 fL (ref 80.0–100.0)
Platelets: 241 10*3/uL (ref 150–400)
RBC: 3.34 MIL/uL — ABNORMAL LOW (ref 3.87–5.11)
RDW: 13.7 % (ref 11.5–15.5)
WBC: 15.6 10*3/uL — ABNORMAL HIGH (ref 4.0–10.5)
nRBC: 0 % (ref 0.0–0.2)

## 2022-11-13 LAB — LACTIC ACID, PLASMA: Lactic Acid, Venous: 2.2 mmol/L (ref 0.5–1.9)

## 2022-11-13 MED ORDER — IPRATROPIUM-ALBUTEROL 0.5-2.5 (3) MG/3ML IN SOLN
3.0000 mL | RESPIRATORY_TRACT | Status: DC
  Administered 2022-11-13 – 2022-11-15 (×11): 3 mL via RESPIRATORY_TRACT
  Filled 2022-11-13 (×11): qty 3

## 2022-11-13 MED ORDER — ACETYLCYSTEINE 20 % IN SOLN
4.0000 mL | Freq: Two times a day (BID) | RESPIRATORY_TRACT | Status: DC
  Administered 2022-11-13 – 2022-11-15 (×5): 4 mL via RESPIRATORY_TRACT
  Filled 2022-11-13 (×5): qty 4

## 2022-11-13 NOTE — Progress Notes (Signed)
Kpad ordered to help with muscular discomfort. Currently none available in house. Will provide to patient as soon as one becomes available.

## 2022-11-13 NOTE — Consult Note (Signed)
[facility] Nurse Consult Note: Reason for Consult:Lymphedema wraps are due for changing on Tuesday, 11/21.Lymphedema wraps were applied to legs and toes by OT in the outpatient lymphedema clinic last Tuesday, 11/13. Wound type:Lymphedema Pressure Injury POA: N/A  I communicated with the patient's provider and her Bedside RN, Ambrose Finland this afternoon about the [facility] nurses limitations in changing the lymphedema wraps. I am  able to apply a short stretch bandage, i.e. an Unna's Boot, but that is not what is currently in place. Dr. Sheppard Coil is going to discuss with the patient and her mother and perhaps also the [facility] to see if changing later in the week post discharge is advisable.  [place] nursing to stand by. Provider will reorder consult if an Unna Boot wrap is needed.  [place] nursing team will not follow, but will remain available to this patient, the nursing and medical teams.  Please re-consult if needed.  Thank you for inviting [place] to participate in this patient's Plan of Care.  Maudie Flakes, MSN, RN, CNS, [place], Serita Grammes, [employer], [employer] phone:  [phone]

## 2022-11-13 NOTE — TOC Initial Note (Signed)
Transition of Care [facility]) - Initial/Assessment Note    Patient Details  Name: [name] Horn MRN: [id number] Date of Birth: 1976-01-04  [facility]) CM/SW Contact:    Magnus Ivan, LCSW Phone Number: 11/13/2022, 10:27 AM  Clinical Narrative:                 CSW spoke with patient via phone for high readmission risk assessment.  Patient lives with her spouse. Spouse or Mother provide transport.  PCP is [facility] [place]. Pharmacy is [facility] on [address]. Patient has a incentive spirometer at home. Patient states she goes to a Lymphedema Clinic where she goes to get her legs wrapped. Patient states the Clinic is working on getting her leg compression equipment at home.   Expected Discharge Plan: Home/Self Care Barriers to Discharge: Continued Medical Work up   Patient Goals and CMS Choice Patient states their goals for this hospitalization and ongoing recovery are:: return home CMS Medicare.gov Compare Post Acute Care list provided to:: Patient Choice offered to / list presented to : Patient  Expected Discharge Plan and Services Expected Discharge Plan: Home/Self Care       Living arrangements for the past 2 months: Single Family Home                                      Prior Living Arrangements/Services Living arrangements for the past 2 months: Single Family Home Lives with:: Spouse Patient language and need for interpreter reviewed:: Yes Do you feel safe going back to the place where you live?: Yes      Need for Family Participation in Patient Care: Yes (Comment) Care giver support system in place?: Yes (comment)   Criminal Activity/Legal Involvement Pertinent to Current Situation/Hospitalization: No - Comment as needed  Activities of Daily Living Home Assistive Devices/Equipment: Eyeglasses ADL Screening (condition at time of admission) Patient's cognitive ability adequate to safely complete daily  activities?: Yes Is the patient deaf or have difficulty hearing?: No Does the patient have difficulty seeing, even when wearing glasses/contacts?: No Does the patient have difficulty concentrating, remembering, or making decisions?: No Patient able to express need for assistance with ADLs?: Yes Does the patient have difficulty dressing or bathing?: No Independently performs ADLs?: Yes (appropriate for developmental age) Does the patient have difficulty walking or climbing stairs?: No Weakness of Legs: None Weakness of Arms/Hands: None  Permission Sought/Granted Permission sought to share information with : Facility Art therapist granted to share information with : Yes, Verbal Permission Granted     Permission granted to share info w AGENCY: as needed        Emotional Assessment       Orientation: : Oriented to Self, Oriented to Place, Oriented to  Time, Oriented to Situation Alcohol / Substance Use: Not Applicable Psych Involvement: No (comment)  Admission diagnosis:  Acute bronchitis [J20.9] Respiratory distress [R06.03] SOB (shortness of breath) [R06.02] Bronchospasm, acute [J98.01] Patient Active Problem List   Diagnosis Date Noted   Acute bronchitis 11/11/2022   HLD (hyperlipidemia) 11/11/2022   Lymphedema 11/11/2022   Obesity with body mass index (BMI) of 30.0 to 39.9 11/11/2022   RA (rheumatoid arthritis) ([place]) 11/11/2022   Migraine 11/11/2022   Cardiac arrhythmia_nonsustained V. 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   Chronic rhinitis 04/23/2020   Adverse food reaction 79/89/2119   Eosinophilic esophagitis 41/74/0814   Oral candidiasis 04/02/2020   History of Clostridium difficile infection 04/02/2020   History of shingles 04/02/2020   History of asthma 04/02/2020   Multiple drug allergies 04/02/2020   Environmental allergies 04/02/2020   History of loop recorder 01/19/2020   PVC (premature ventricular contraction) 01/19/2020   Uterine cancer ([place]) 01/19/2020   Colitis 08/14/2019   Constipation    Gastroparesis 04/03/2019   Palpitations 04/10/2018   Nausea and vomiting 04/26/2017   Nausea & vomiting 04/25/2017   Chest pain 04/25/2017   Enterocolitis due to Clostridium difficile 01/04/2016   Incisional hernia without obstruction or gangrene 11/18/2015   Personal history of other diseases of the nervous system and sense organs 10/14/2015   Disease of pancreas 01/21/2015   Other specified bacterial agents as the cause of diseases classified elsewhere 01/21/2015   Radiculopathy of cervical region 12/09/2014   Brachial neuritis 12/09/2014   Cervical radicular pain 12/09/2014   Annual physical exam 01/30/2012   Melena 01/30/2012   Pain in ankle 11/24/2010   Anxiety state 10/11/2010   DVT 10/11/2010   Generalized anxiety disorder 10/11/2010   COSTOCHONDRITIS 03/03/2008   Cellulitis of toe 12/01/2007   MENOPAUSE, PREMATURE 07/20/2007   UTERINE CANCER, HX OF 07/16/2007   Other postprocedural status(V45.89) 07/16/2007   PCP:  Dierdre Searles, PA-C Pharmacy:   [facility] [phone] - [place], [place] - [address] [place] [place] [address] Phone: [phone] Fax: [phone]     Social Determinants of Health (SDOH)  Interventions    Readmission Risk Interventions    11/13/2022   10:26 AM  Readmission Risk Prevention Plan  Transportation Screening Complete  PCP or Specialist Appt within 3-5 Days Complete  HRI or [place] Complete  Social Work Consult for [place] Planning/Counseling Complete  Palliative Care Screening Not Applicable  Medication Review Press photographer) Complete

## 2022-11-13 NOTE — Progress Notes (Signed)
PROGRESS NOTE    [name] Horn   YCX:[id number] DOB: 07/11/1976  DOA: 11/11/2022 Date of Service: 11/13/22 PCP: Dierdre Searles, PA-C     Brief Narrative / Hospital Course:  [name] Horn is a 46 y.o. 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 tach and atrial tachycardia, has loop recorder in place), obesity with body mass index (BMI) of 30.73, oral thrush, who presents to ED from home on 11/11/2022 with SOB x1 day assoc w/ cough, wheeze, subjective f/c, pleuritic CP. Patient used to take methotrexate, prednisone and IVIG for compromised immune system.  Due to oral thrush, her immunologist at the [place] hold off her methotrexate, prednisone and IVIG.  Currently patient is taking Dupixent once a week.  Last dose was on 11/10.  11/17:  WBC 9.2, troponin level 3, BNP 15, negative COVID PCR, negative flu a and B, electrolytes renal function okay, temperature 99.4, blood pressure 119/85, heart rate 92, RR 22, oxygen saturation 97% on room air.  Chest x-ray showed lower lobe lung volume without infiltration.  CTA is limited study, negative for PE. Received 1 dose of Rocephin and azithromycin in ED. Patient was placed on telemetry bed for observation. Started on IV Vancomycin and cefepime due to immunocompromised status, as well as IV Solu-Medrol, gentle IV fluids given meeting sepsis criteria. Respiraoty viral panel negative. Resp/Sputum Cx and BCx pending.  11/18: VSS, remains on room air but feels more comfortable on O2. SEPSIS CRITERIA RESOLVED. No concerns on AM labs, elevated WBC likely d/t IV steroids. Lactate trending down. Awaiting resp/sputum Cx and BCx. Reprots chronic resistant thrush s/p bx w/ ENT at [facility], will maintain current antifungals and add pain  control w viscous lidocaine and cepacol.  11/19: still significant coughing w/ sputum, adding Mucomyst. Lactate trending down but still elevated at 2.2. WBC improving but still elevated at 15.6. BCx NGx2d. Resp Cx reincubated for better growth. Sputum Cx pending. 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 tach and atrial tachycardia   Oral thrush   Obesity with body mass index (BMI) of 30.0 to 39.9  Acute bronchitis and severe sepsis - sepsis resolved:  - Place in tele bed for obs --> may need to change to inpatient - IV Vancomycin and cefepime due to immunocompromised status --> today 11/13/22 is Day 3 abx, d/c vanc today .  - Solu-Medrol 40 mg twice daily - Mucinex for cough  - Bronchodilators frequency increased - added Mucomyst  - incentive spirometry and flutter valve  - Urine S. pneumococcal antigen Pending  - Follow up blood culture x2 --> NG x2d - Follow sputum culture - Check respiratory virus panel --> negative  - trend lactic acid level --> improved on AM values but still elevated  - IVF: 500 cc of NS, then 75 cc/h (will not give aggressive IV fluid due to severe lymphedema) --> d/c once appetite improves    Specific antibody deficiency  with normal immunoglobulin concentration and normal number of B cells, hx of RA (rheumatoid arthritis):  currently pt is taking Dupixent once a week, last dose was on 11/10 -f/u with immunologist in [place] (hyperlipidemia) -Pravastatin   Lymphedema -Hold home Lasix due to elevated lactic acid level and sepsis.    Anxiety state -As needed Ativan   Migraine -As needed sumatriptan -Continue home Topamax for prophylaxis   Eosinophilic esophagitis -Pepcid and Carafate   Cardiac arrhythmia_nonsustained V. tach and atrial tachycardia -Patient has loop recorder in place -Continue metoprolol   Oral thrush -Continue home fluconazole and nystatin   Obesity with body mass index (BMI) of 30.0 to 39.9: BMI= 30.73   and BW= 81.2 kg -Diet and exercise.   -Encourage to lose weight.    DVT prophylaxis: enoxaparin Pertinent IV fluids/nutrition: gentl IV fluids w/ NS --> plan d/c likely tomorrow  Central lines / invasive devices: none  Code Status: FULL CODE  Disposition: inpatient  TOC needs: none at this time Barriers to discharge / significant pending items: culture results in immune compromised patient prior to determining final abx course, no significant improvement in respiratory statys               Subjective:  Patient reports still coughing and productive of yellow sputum - about same as yesterday  Denies CP. Reports mild SOB.  Pain controlled other than some mouth pain which is stable  Denies new weakness.  Tolerating diet.  Reports no concerns w/ urination/defecation.   Family Communication: none at this time     Objective Findings:  Vitals:   11/13/22 0500 11/13/22 0801 11/13/22 0905 11/13/22 1227  BP: 104/69  111/71   Pulse: 89 90 98 95  Resp: '20 18 16 18  '$ Temp: 97.6 F (36.4 C)  98.6 F (37 C)   TempSrc:      SpO2: 95% 98% 94% 95%  Weight:      Height:       No intake or output data in the 24 hours ending 11/13/22 1424  Filed Weights   11/11/22 0422  Weight: 81.2 kg    Examination: Constitutional:  VS as above General Appearance: alert, well-developed, well-nourished, NAD Respiratory: Normal respiratory effort +scattered moderate wheeze +scattered rhonchi No rales Cardiovascular: S1/S2 normal No murmur No rub/gallop  auscultated Gastrointestinal: No tenderness Musculoskeletal:  No clubbing/cyanosis of digits Symmetrical movement in all extremities Neurological: No cranial nerve deficit on limited exam Alert Psychiatric: Normal judgment/insight Normal mood and affect       Scheduled Medications:   acetylcysteine  4 mL Nebulization BID   baclofen  10 mg Oral QHS   cycloSPORINE  1 drop Both Eyes BID   enoxaparin (LOVENOX) injection  40 mg Subcutaneous Q24H   famotidine  40 mg Oral QHS   fluconazole  300 mg Oral Q M,W,F   folic acid  2 mg Oral Daily   ipratropium-albuterol  3 mL Nebulization Q4H   methylPREDNISolone (SOLU-MEDROL) injection  125 mg Intravenous Q12H   metoCLOPramide  10 mg Oral BID   metoprolol succinate  50 mg Oral Daily   nystatin  10 mL Oral TID   olopatadine  1 drop Both Eyes BID   pantoprazole  40 mg Oral BID   polyvinyl alcohol  1 drop Both Eyes BID   pravastatin  40 mg Oral QHS   sucralfate  1 g Oral QID   topiramate  100 mg Oral Daily   topiramate  25 mg Oral  QHS    Continuous Infusions:  sodium chloride Stopped (11/12/22 0717)   sodium chloride 75 mL/hr at 11/13/22 1009   ceFEPime (MAXIPIME) IV 2 g (11/13/22 1341)    PRN Medications:  sodium chloride, acetaminophen, albuterol, alum & mag hydroxide-simeth, amphetamine-dextroamphetamine, atropine, chlorpheniramine-HYDROcodone, cyclobenzaprine, dextromethorphan-guaiFENesin, dicyclomine, lidocaine, LORazepam, meloxicam, menthol-cetylpyridinium, nystatin, ondansetron (ZOFRAN) IV, oxyCODONE-acetaminophen **AND** oxyCODONE, sodium chloride, SUMAtriptan, traZODone, triamcinolone cream  Antimicrobials:  Anti-infectives (From admission, onward)    Start     Dose/Rate Route Frequency Ordered Stop   11/12/22 1000  vancomycin (VANCOREADY) IVPB 1750 mg/350 mL  Status:  Discontinued        1,750 mg 175 mL/hr over 120 Minutes Intravenous Every 24 hours 11/11/22 1433 11/13/22 1004   11/12/22 0700  cefTRIAXone (ROCEPHIN)  2 g in sodium chloride 0.9 % 100 mL IVPB  Status:  Discontinued        2 g 200 mL/hr over 30 Minutes Intravenous Every 24 hours 11/11/22 0739 11/11/22 0849   11/12/22 0700  azithromycin (ZITHROMAX) 500 mg in sodium chloride 0.9 % 250 mL IVPB  Status:  Discontinued        500 mg 250 mL/hr over 60 Minutes Intravenous Every 24 hours 11/11/22 0739 11/11/22 0849   11/11/22 1400  ceFEPIme (MAXIPIME) 2 g in sodium chloride 0.9 % 100 mL IVPB        2 g 200 mL/hr over 30 Minutes Intravenous Every 8 hours 11/11/22 0910     11/11/22 1000  fluconazole (DIFLUCAN) tablet 300 mg        300 mg Oral Every M-W-F 11/11/22 0843     11/11/22 1000  vancomycin (VANCOREADY) IVPB 1750 mg/350 mL        1,750 mg 175 mL/hr over 120 Minutes Intravenous  Once 11/11/22 0908 11/11/22 1201   11/11/22 0700  cefTRIAXone (ROCEPHIN) 2 g in sodium chloride 0.9 % 100 mL IVPB        2 g 200 mL/hr over 30 Minutes Intravenous  Once 11/11/22 0646 11/11/22 0733   11/11/22 0700  azithromycin (ZITHROMAX) 500 mg in sodium chloride 0.9 % 250 mL IVPB        500 mg 250 mL/hr over 60 Minutes Intravenous  Once 11/11/22 0646 11/11/22 0757           Data Reviewed: I have personally reviewed following labs and imaging studies  CBC: Recent Labs  Lab 11/11/22 0429 11/12/22 0556 11/13/22 0519  WBC 9.2 18.0* 15.6*  HGB 14.0 11.3* 10.5*  HCT 41.1 33.6* 31.6*  MCV 92.2 93.9 94.6  PLT 193 271 751   Basic Metabolic Panel: Recent Labs  Lab 11/11/22 0429 11/12/22 0556 11/13/22 0519  NA 137 137 139  K 3.7 4.4 4.2  CL 98 106 111  CO2 '30 23 23  '$ GLUCOSE 116* 164* 143*  BUN 10 22* 20  CREATININE 0.90 1.01* 0.79  CALCIUM 10.0 8.8* 8.7*   GFR: Estimated Creatinine Clearance: 88.7 mL/min (by C-G formula based on SCr of 0.79 mg/dL). Liver Function Tests: No results for input(s): "AST", "ALT", "ALKPHOS", "BILITOT", "PROT", "ALBUMIN" in the last 168 hours. No results for input(s): "LIPASE", "AMYLASE" in the last 168 hours. No  results for input(s): "AMMONIA" in the last 168 hours. Coagulation Profile: No results for input(s): "INR", "PROTIME" in the last 168 hours. Cardiac Enzymes: No results for input(s): "CKTOTAL", "CKMB", "CKMBINDEX", "TROPONINI" in the last 168 hours. BNP (last 3 results) No results for input(s): "PROBNP" in the last 8760 hours. HbA1C: No results for  input(s): "HGBA1C" in the last 72 hours. CBG: No results for input(s): "GLUCAP" in the last 168 hours. Lipid Profile: No results for input(s): "CHOL", "HDL", "LDLCALC", "TRIG", "CHOLHDL", "LDLDIRECT" in the last 72 hours. Thyroid Function Tests: No results for input(s): "TSH", "T4TOTAL", "FREET4", "T3FREE", "THYROIDAB" in the last 72 hours. Anemia Panel: No results for input(s): "VITAMINB12", "FOLATE", "FERRITIN", "TIBC", "IRON", "RETICCTPCT" in the last 72 hours. Most Recent Urinalysis On File:     Component Value Date/Time   COLORURINE YELLOW (A) 06/28/2022 1212   APPEARANCEUR CLEAR (A) 06/28/2022 1212   LABSPEC 1.010 06/28/2022 1212   PHURINE 7.0 06/28/2022 1212   GLUCOSEU NEGATIVE 06/28/2022 1212   HGBUR NEGATIVE 06/28/2022 1212   HGBUR negative 07/25/2007 [place] 06/28/2022 1212   KETONESUR NEGATIVE 06/28/2022 1212   PROTEINUR NEGATIVE 06/28/2022 1212   UROBILINOGEN 1.0 05/27/2009 1500   NITRITE NEGATIVE 06/28/2022 1212   LEUKOCYTESUR SMALL (A) 06/28/2022 1212   Sepsis Labs: '@LABRCNTIP'$ (procalcitonin:4,lacticidven:4)  Recent Results (from the past 240 hour(s))  Resp Panel by RT-PCR (Flu A&B, Covid) Anterior Nasal Swab     Status: None   Collection Time: 11/11/22  5:33 AM   Specimen: Anterior Nasal Swab  Result Value Ref Range Status   SARS Coronavirus 2 by RT PCR NEGATIVE NEGATIVE Final    Comment: (NOTE) SARS-CoV-2 target nucleic acids are NOT DETECTED.  The SARS-CoV-2 RNA is generally detectable in upper respiratory specimens during the acute phase of infection. The lowest concentration of  SARS-CoV-2 viral copies this assay can detect is 138 copies/mL. A negative result does not preclude SARS-Cov-2 infection and should not be used as the sole basis for treatment or other patient management decisions. A negative result may occur with  improper specimen collection/handling, submission of specimen other than nasopharyngeal swab, presence of viral mutation(s) within the areas targeted by this assay, and inadequate number of viral copies(<138 copies/mL). A negative result must be combined with clinical observations, patient history, and epidemiological information. The expected result is Negative.  Fact Sheet for Patients:  EntrepreneurPulse.com.au  Fact Sheet for Healthcare Providers:  IncredibleEmployment.be  This test is no t yet approved or cleared by the [place] FDA and  has been authorized for detection and/or diagnosis of SARS-CoV-2 by FDA under an Emergency Use Authorization (EUA). This EUA will remain  in effect (meaning this test can be used) for the duration of the COVID-19 declaration under Section 564(b)(1) of the Act, 21 U.S.C.section 360bbb-3(b)(1), unless the authorization is terminated  or revoked sooner.       Influenza A by PCR NEGATIVE NEGATIVE Final   Influenza B by PCR NEGATIVE NEGATIVE Final    Comment: (NOTE) The Xpert Xpress SARS-CoV-2/FLU/RSV plus assay is intended as an aid in the diagnosis of influenza from Nasopharyngeal swab specimens and should not be used as a sole basis for treatment. Nasal washings and aspirates are unacceptable for Xpert Xpress SARS-CoV-2/FLU/RSV testing.  Fact Sheet for Patients: EntrepreneurPulse.com.au  Fact Sheet for Healthcare Providers: IncredibleEmployment.be  This test is not yet approved or cleared by the [place] FDA and has been authorized for detection and/or diagnosis of SARS-CoV-2 by FDA under an Emergency Use  Authorization (EUA). This EUA will remain in effect (meaning this test can be used) for the duration of the COVID-19 declaration under Section 564(b)(1) of the Act, 21 U.S.C. section 360bbb-3(b)(1), unless the authorization is terminated or revoked.  Performed at [facility], [address], [place], [place] [address]   Respiratory (~20  pathogens) panel by PCR     Status: None   Collection Time: 11/11/22  5:33 AM   Specimen: Anterior Nasal Swab; Respiratory  Result Value Ref Range Status   Adenovirus NOT DETECTED NOT DETECTED Final   Coronavirus 229E NOT DETECTED NOT DETECTED Final    Comment: (NOTE) The Coronavirus on the Respiratory Panel, DOES NOT test for the novel  Coronavirus (2019 nCoV)    Coronavirus HKU1 NOT DETECTED NOT DETECTED Final   Coronavirus NL63 NOT DETECTED NOT DETECTED Final   Coronavirus OC43 NOT DETECTED NOT DETECTED Final   Metapneumovirus NOT DETECTED NOT DETECTED Final   Rhinovirus / Enterovirus NOT DETECTED NOT DETECTED Final   Influenza A NOT DETECTED NOT DETECTED Final   Influenza B NOT DETECTED NOT DETECTED Final   Parainfluenza Virus 1 NOT DETECTED NOT DETECTED Final   Parainfluenza Virus 2 NOT DETECTED NOT DETECTED Final   Parainfluenza Virus 3 NOT DETECTED NOT DETECTED Final   Parainfluenza Virus 4 NOT DETECTED NOT DETECTED Final   Respiratory Syncytial Virus NOT DETECTED NOT DETECTED Final   Bordetella pertussis NOT DETECTED NOT DETECTED Final   Bordetella Parapertussis NOT DETECTED NOT DETECTED Final   Chlamydophila pneumoniae NOT DETECTED NOT DETECTED Final   Mycoplasma pneumoniae NOT DETECTED NOT DETECTED Final    Comment: Performed at [facility], [address] [place], [place] [address]  Expectorated Sputum Assessment w Gram Stain, Rflx to Resp Cult     Status: None   Collection Time: 11/11/22  6:58 AM   Specimen: Sputum  Result Value Ref Range Status   Specimen Description SPUTUM  Final   Special Requests NONE  Final    Sputum evaluation   Final    THIS SPECIMEN IS ACCEPTABLE FOR SPUTUM CULTURE Performed at [facility], [address], [place], [place] [address]    Report Status 11/11/2022 FINAL  Final  Culture, Respiratory w Gram Stain     Status: None (Preliminary result)   Collection Time: 11/11/22  6:58 AM   Specimen: SPU  Result Value Ref Range Status   Specimen Description   Final    SPUTUM Performed at [facility], [address], [place], [place] [address]    Special Requests   Final    NONE Reflexed from [phone] Performed at [facility], [address] [place], [place] [address]    Gram Stain NO WBC SEEN FEW GRAM POSITIVE COCCI IN PAIRS   Final   Culture   Final    CULTURE REINCUBATED FOR BETTER GROWTH Performed at [facility], [place] [address], [place], [place] [address]    Report Status PENDING  Incomplete  Culture, blood (x 2)     Status: None (Preliminary result)   Collection Time: 11/11/22  8:09 AM   Specimen: BLOOD RIGHT ARM  Result Value Ref Range Status   Specimen Description BLOOD RIGHT ARM  Final   Special Requests   Final    BOTTLES DRAWN AEROBIC AND ANAEROBIC Blood Culture adequate volume   Culture   Final    NO GROWTH 2 DAYS Performed at [facility], [address], [place],  [address]    Report Status PENDING  Incomplete  Culture, blood (x 2)     Status: None (Preliminary result)   Collection Time: 11/11/22  9:15 AM   Specimen: BLOOD RIGHT FOREARM  Result Value Ref Range Status   Specimen Description BLOOD RIGHT FOREARM  Final   Special Requests  Final    BOTTLES DRAWN AEROBIC AND ANAEROBIC Blood Culture adequate volume   Culture   Final    NO GROWTH 2 DAYS Performed at [facility], [address] [place], [place] [address]    Report Status PENDING  Incomplete  MRSA Next Gen by PCR, Nasal     Status: None   Collection Time: 11/12/22  1:46 PM   Specimen: Nasal Mucosa; Nasal Swab  Result  Value Ref Range Status   MRSA by PCR Next Gen NOT DETECTED NOT DETECTED Final    Comment: (NOTE) The GeneXpert MRSA Assay (FDA approved for NASAL specimens only), is one component of a comprehensive MRSA colonization surveillance program. It is not intended to diagnose MRSA infection nor to guide or monitor treatment for MRSA infections. Test performance is not FDA approved in patients less than 83 years old. Performed at [facility], [address], [place],  [address]          Radiology Studies: CT Angio Chest PE W and/or Wo Contrast  Result Date: 11/11/2022 CLINICAL DATA:  46 year old female with history of shortness of breath and congestion. Mid chest pain. EXAM: CT ANGIOGRAPHY CHEST WITH CONTRAST TECHNIQUE: Multidetector CT imaging of the chest was performed using the standard protocol during bolus administration of intravenous contrast. Multiplanar CT image reconstructions and MIPs were obtained to evaluate the vascular anatomy. RADIATION DOSE REDUCTION: This exam was performed according to the departmental dose-optimization program which includes automated exposure control, adjustment of the mA and/or kV according to patient size and/or use of iterative reconstruction technique. CONTRAST:  73m OMNIPAQUE IOHEXOL 350 MG/ML SOLN COMPARISON:  CTA chest 09/25/2022. FINDINGS: Comment: Today's study is limited by considerable patient respiratory motion. Cardiovascular: Within the limitations of today's examination, there is no central, lobar or proximal segmental sized filling defect. Distal segmental and subsegmental sized filling defects can not be entirely excluded on today's motion limited examination. Heart size is normal. There is no significant pericardial fluid, thickening or pericardial calcification. No atherosclerotic calcifications are noted in the thoracic aorta or the coronary arteries. Mediastinum/Nodes: No pathologically enlarged mediastinal or hilar lymph  nodes. Patulous fluid-filled esophagus. No axillary lymphadenopathy. Lungs/Pleura: No acute consolidative airspace disease. No pleural effusions. No definite suspicious appearing pulmonary nodules or masses are noted on today's motion limited examination. Upper Abdomen: Diffuse low attenuation throughout the visualized hepatic parenchyma, indicative of a background of severe hepatic steatosis. Status post cholecystectomy. Musculoskeletal: Electronic device in the medial aspect of the left breast, presumably an implantable loop recorder. There are no aggressive appearing lytic or blastic lesions noted in the visualized portions of the skeleton. Review of the MIP images confirms the above findings. IMPRESSION: 1. Limited study secondary to patient respiratory motion. No central, lobar or proximal segmental sized pulmonary embolism identified. 2. No other acute findings noted in the thorax to account for the patient's symptoms. 3. Severe hepatic steatosis. Electronically Signed   By: DVinnie LangtonM.D.   On: 11/11/2022 06:28   DG Chest Portable 1 View  Result Date: 11/11/2022 CLINICAL DATA:  46year old female with history of shortness of breath. EXAM: PORTABLE CHEST 1 VIEW COMPARISON:  Chest x-ray 09/25/2022. FINDINGS: Lung volumes are low. No consolidative airspace disease. No pleural effusions. No pneumothorax. No pulmonary nodule or mass noted. Pulmonary vasculature and the cardiomediastinal silhouette are within normal limits. Electronic device projecting over the left hemithorax, presumably an implantable loop recorder. IMPRESSION: 1. Low lung volumes without radiographic evidence of acute cardiopulmonary disease. Electronically Signed  By: Vinnie Langton M.D.   On: 11/11/2022 05:05            LOS: 1 day     Emeterio Reeve, DO [facility] 11/13/2022, 2:24 PM    Dictation software may have been used to generate the above note. Typos may occur and escape review in  typed/dictated notes. Please contact Dr Sheppard Coil directly for clarity if needed.  Staff may message me via secure chat in [place]  but this may not receive an immediate response,  please page me for urgent matters!  If 7PM-7AM, please contact night coverage [email or web address]

## 2022-11-13 NOTE — Progress Notes (Signed)
Patient with bilateral lymphedema wraps in place from outpatient center, due to be changed Tuesday. Wraps are intake and able to slide fingers between wrap and skin. +CMS noted.

## 2022-11-14 DIAGNOSIS — G43909 Migraine, unspecified, not intractable, without status migrainosus: Secondary | ICD-10-CM | POA: Diagnosis not present

## 2022-11-14 DIAGNOSIS — M059 Rheumatoid arthritis with rheumatoid factor, unspecified: Secondary | ICD-10-CM | POA: Diagnosis not present

## 2022-11-14 DIAGNOSIS — E782 Mixed hyperlipidemia: Secondary | ICD-10-CM | POA: Diagnosis not present

## 2022-11-14 DIAGNOSIS — J209 Acute bronchitis, unspecified: Secondary | ICD-10-CM | POA: Diagnosis not present

## 2022-11-14 LAB — CULTURE, RESPIRATORY W GRAM STAIN: Gram Stain: NONE SEEN

## 2022-11-14 MED ORDER — AMOXICILLIN-POT CLAVULANATE 875-125 MG PO TABS
1.0000 | ORAL_TABLET | Freq: Two times a day (BID) | ORAL | Status: DC
  Administered 2022-11-14 – 2022-11-16 (×4): 1 via ORAL
  Filled 2022-11-14 (×5): qty 1

## 2022-11-14 NOTE — Progress Notes (Signed)
PROGRESS NOTE    [name] Horn   IDP:[id number] DOB: 03/01/1976  DOA: 11/11/2022 Date of Service: 11/14/22 PCP: Dierdre Searles, PA-C     Brief Narrative / Hospital Course:  [name] Horn is a 46 y.o. 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 tach and atrial tachycardia, has loop recorder in place), obesity with body mass index (BMI) of 30.73, oral thrush, who presents to ED from home on 11/11/2022 with SOB x1 day assoc w/ cough, wheeze, subjective f/c, pleuritic CP. Patient used to take methotrexate, prednisone and IVIG for compromised immune system.  Due to oral thrush, her immunologist at the [facility] hold off her methotrexate, prednisone and IVIG.  Currently patient is taking Dupixent once a week.  Last dose was on 11/10.  11/17:  WBC 9.2, troponin level 3, BNP 15, negative COVID PCR, negative flu a and B, electrolytes renal function okay, temperature 99.4, blood pressure 119/85, heart rate 92, RR 22, oxygen saturation 97% on room air.  Chest x-ray showed lower lobe lung volume without infiltration.  CTA is limited study, negative for PE. Received 1 dose of Rocephin and azithromycin in ED. Patient was placed on telemetry bed for observation. Started on IV Vancomycin and cefepime due to immunocompromised status, as well as IV Solu-Medrol, gentle IV fluids given meeting sepsis criteria. Respiraoty viral panel negative. Resp/Sputum Cx and BCx pending.  11/18: VSS, remains on room air but feels more comfortable on O2. SEPSIS CRITERIA RESOLVED. No concerns on AM labs, elevated WBC likely d/t IV steroids. Lactate trending down. Awaiting resp/sputum Cx and BCx. Reprots chronic resistant thrush s/p bx w/ ENT at [facility], will maintain current antifungals and add pain  control w viscous lidocaine and cepacol.  11/19: still significant coughing w/ sputum, adding Mucomyst. Lactate trending down but still elevated at 2.2. WBC improving but still elevated at 15.6. BCx NGx2d. Resp Cx reincubated for better growth. Sputum Cx pending. Increased nebulizers and added Mucomyst  11/20: breathing a bit better w/ the mucomyst today, still significant productive cough.   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 tach and atrial tachycardia   Oral thrush   Obesity with body mass index (BMI) of 30.0 to 39.9  Acute bronchitis and severe sepsis - sepsis resolved:  - Cefepime due to immunocompromised status --> today 11/13/22 is Day 4 abx - Solu-Medrol 40 mg twice daily - Mucinex for cough  - Bronchodilators frequency increased 11/19 - added Mucomyst 11/19 - incentive spirometry and flutter valve  - Urine S. 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   Anxiety state -As needed Ativan   Migraine -As needed sumatriptan -Continue home Topamax for  prophylaxis   Eosinophilic esophagitis -Pepcid and Carafate   Cardiac arrhythmia_nonsustained V. tach and atrial tachycardia -Patient has loop recorder in place -Continue metoprolol   Oral thrush -Continue home fluconazole and nystatin   Obesity with body mass index (BMI) of 30.0 to 39.9: BMI= 30.73   and BW= 81.2 kg -Diet and exercise.   -Encourage to lose weight.    DVT prophylaxis: enoxaparin Pertinent IV fluids/nutrition: gentl IV fluids w/ NS --> plan d/c likely tomorrow  Central lines / invasive devices: none  Code Status: FULL CODE  Disposition: inpatient  TOC needs: none at this time Barriers to discharge / significant pending items: await improvement in respiratory status, anticipate d/c in 1-2 days              Subjective:  Patient reports still coughing and productive of yellow sputum - slightly better, mucus seems to be thinning a bit  Denies CP. Reports mild SOB.  Pain controlled other than some mouth pain which is stable  Denies new weakness.  Tolerating diet.  Reports no concerns w/ urination/defecation.    Family Communication: none at this time     Objective Findings:  Vitals:   11/14/22 0504 11/14/22 0759 11/14/22 0808 11/14/22 1117  BP:   119/79   Pulse:  93 88 78  Resp:  '20 20 20  '$ Temp:   98 F (36.7 C)   TempSrc:   Oral   SpO2: 95% 96% 100% 95%  Weight:      Height:        Intake/Output Summary (Last 24 hours) at 11/14/2022 1256 Last data filed at 11/14/2022 1033 Gross per 24 hour  Intake 600 ml  Output --  Net 600 ml    Filed Weights   11/11/22 0422  Weight: 81.2 kg    Examination: Constitutional:  VS as above General Appearance: alert, well-developed, well-nourished, NAD Respiratory: Normal respiratory effort +scattered moderate wheeze +scattered rhonchi - improved No rales Cardiovascular: S1/S2 normal No murmur No rub/gallop auscultated Gastrointestinal: No tenderness Musculoskeletal:  No  clubbing/cyanosis of digits Symmetrical movement in all extremities Neurological: No cranial nerve deficit on limited exam Alert Psychiatric: Normal judgment/insight Normal mood and affect       Scheduled Medications:   acetylcysteine  4 mL Nebulization BID   baclofen  10 mg Oral QHS   cycloSPORINE  1 drop Both Eyes BID   enoxaparin (LOVENOX) injection  40 mg Subcutaneous Q24H   famotidine  40 mg Oral QHS   fluconazole  300 mg Oral Q M,W,F   folic acid  2 mg Oral Daily   ipratropium-albuterol  3 mL Nebulization Q4H   methylPREDNISolone (SOLU-MEDROL) injection  125 mg Intravenous Q12H   metoCLOPramide  10 mg Oral BID   metoprolol succinate  50 mg Oral Daily   nystatin  10 mL Oral TID   olopatadine  1 drop Both Eyes BID   pantoprazole  40 mg Oral BID   polyvinyl alcohol  1 drop Both Eyes BID   pravastatin  40 mg Oral QHS   sucralfate  1 g Oral QID   topiramate  100 mg Oral Daily   topiramate  25 mg Oral QHS    Continuous Infusions:  sodium chloride Stopped (11/12/22 0717)  sodium chloride 75 mL/hr at 11/13/22 1009   ceFEPime (MAXIPIME) IV 2 g (11/14/22 0622)    PRN Medications:  sodium chloride, acetaminophen, albuterol, alum & mag hydroxide-simeth, amphetamine-dextroamphetamine, atropine, chlorpheniramine-HYDROcodone, cyclobenzaprine, dextromethorphan-guaiFENesin, dicyclomine, lidocaine, LORazepam, meloxicam, menthol-cetylpyridinium, nystatin, ondansetron (ZOFRAN) IV, oxyCODONE-acetaminophen **AND** oxyCODONE, sodium chloride, SUMAtriptan, traZODone, triamcinolone cream  Antimicrobials:  Anti-infectives (From admission, onward)    Start     Dose/Rate Route Frequency Ordered Stop   11/12/22 1000  vancomycin (VANCOREADY) IVPB 1750 mg/350 mL  Status:  Discontinued        1,750 mg 175 mL/hr over 120 Minutes Intravenous Every 24 hours 11/11/22 1433 11/13/22 1004   11/12/22 0700  cefTRIAXone (ROCEPHIN) 2 g in sodium chloride 0.9 % 100 mL IVPB  Status:  Discontinued         2 g 200 mL/hr over 30 Minutes Intravenous Every 24 hours 11/11/22 0739 11/11/22 0849   11/12/22 0700  azithromycin (ZITHROMAX) 500 mg in sodium chloride 0.9 % 250 mL IVPB  Status:  Discontinued        500 mg 250 mL/hr over 60 Minutes Intravenous Every 24 hours 11/11/22 0739 11/11/22 0849   11/11/22 1400  ceFEPIme (MAXIPIME) 2 g in sodium chloride 0.9 % 100 mL IVPB        2 g 200 mL/hr over 30 Minutes Intravenous Every 8 hours 11/11/22 0910     11/11/22 1000  fluconazole (DIFLUCAN) tablet 300 mg        300 mg Oral Every M-W-F 11/11/22 0843     11/11/22 1000  vancomycin (VANCOREADY) IVPB 1750 mg/350 mL        1,750 mg 175 mL/hr over 120 Minutes Intravenous  Once 11/11/22 0908 11/11/22 1201   11/11/22 0700  cefTRIAXone (ROCEPHIN) 2 g in sodium chloride 0.9 % 100 mL IVPB        2 g 200 mL/hr over 30 Minutes Intravenous  Once 11/11/22 0646 11/11/22 0733   11/11/22 0700  azithromycin (ZITHROMAX) 500 mg in sodium chloride 0.9 % 250 mL IVPB        500 mg 250 mL/hr over 60 Minutes Intravenous  Once 11/11/22 0646 11/11/22 0757           Data Reviewed: I have personally reviewed following labs and imaging studies  CBC: Recent Labs  Lab 11/11/22 0429 11/12/22 0556 11/13/22 0519  WBC 9.2 18.0* 15.6*  HGB 14.0 11.3* 10.5*  HCT 41.1 33.6* 31.6*  MCV 92.2 93.9 94.6  PLT 193 271 630   Basic Metabolic Panel: Recent Labs  Lab 11/11/22 0429 11/12/22 0556 11/13/22 0519  NA 137 137 139  K 3.7 4.4 4.2  CL 98 106 111  CO2 '30 23 23  '$ GLUCOSE 116* 164* 143*  BUN 10 22* 20  CREATININE 0.90 1.01* 0.79  CALCIUM 10.0 8.8* 8.7*   GFR: Estimated Creatinine Clearance: 88.7 mL/min (by C-G formula based on SCr of 0.79 mg/dL). Liver Function Tests: No results for input(s): "AST", "ALT", "ALKPHOS", "BILITOT", "PROT", "ALBUMIN" in the last 168 hours. No results for input(s): "LIPASE", "AMYLASE" in the last 168 hours. No results for input(s): "AMMONIA" in the last 168 hours. Coagulation  Profile: No results for input(s): "INR", "PROTIME" in the last 168 hours. Cardiac Enzymes: No results for input(s): "CKTOTAL", "CKMB", "CKMBINDEX", "TROPONINI" in the last 168 hours. BNP (last 3 results) No results for input(s): "PROBNP" in the last 8760 hours. HbA1C: No results for input(s): "HGBA1C" in the last 72 hours. CBG: No results for input(s): "GLUCAP" in  the last 168 hours. Lipid Profile: No results for input(s): "CHOL", "HDL", "LDLCALC", "TRIG", "CHOLHDL", "LDLDIRECT" in the last 72 hours. Thyroid Function Tests: No results for input(s): "TSH", "T4TOTAL", "FREET4", "T3FREE", "THYROIDAB" in the last 72 hours. Anemia Panel: No results for input(s): "VITAMINB12", "FOLATE", "FERRITIN", "TIBC", "IRON", "RETICCTPCT" in the last 72 hours. Most Recent Urinalysis On File:     Component Value Date/Time   COLORURINE YELLOW (A) 06/28/2022 1212   APPEARANCEUR CLEAR (A) 06/28/2022 1212   LABSPEC 1.010 06/28/2022 1212   PHURINE 7.0 06/28/2022 1212   GLUCOSEU NEGATIVE 06/28/2022 1212   HGBUR NEGATIVE 06/28/2022 1212   HGBUR negative 07/25/2007 [place] 06/28/2022 1212   KETONESUR NEGATIVE 06/28/2022 1212   PROTEINUR NEGATIVE 06/28/2022 1212   UROBILINOGEN 1.0 05/27/2009 1500   NITRITE NEGATIVE 06/28/2022 1212   LEUKOCYTESUR SMALL (A) 06/28/2022 1212   Sepsis Labs: '@LABRCNTIP'$ (procalcitonin:4,lacticidven:4)  Recent Results (from the past 240 hour(s))  Resp Panel by RT-PCR (Flu A&B, Covid) Anterior Nasal Swab     Status: None   Collection Time: 11/11/22  5:33 AM   Specimen: Anterior Nasal Swab  Result Value Ref Range Status   SARS Coronavirus 2 by RT PCR NEGATIVE NEGATIVE Final    Comment: (NOTE) SARS-CoV-2 target nucleic acids are NOT DETECTED.  The SARS-CoV-2 RNA is generally detectable in upper respiratory specimens during the acute phase of infection. The lowest concentration of SARS-CoV-2 viral copies this assay can detect is 138 copies/mL. A negative  result does not preclude SARS-Cov-2 infection and should not be used as the sole basis for treatment or other patient management decisions. A negative result may occur with  improper specimen collection/handling, submission of specimen other than nasopharyngeal swab, presence of viral mutation(s) within the areas targeted by this assay, and inadequate number of viral copies(<138 copies/mL). A negative result must be combined with clinical observations, patient history, and epidemiological information. The expected result is Negative.  Fact Sheet for Patients:  EntrepreneurPulse.com.au  Fact Sheet for Healthcare Providers:  IncredibleEmployment.be  This test is no t yet approved or cleared by the [place] FDA and  has been authorized for detection and/or diagnosis of SARS-CoV-2 by FDA under an Emergency Use Authorization (EUA). This EUA will remain  in effect (meaning this test can be used) for the duration of the COVID-19 declaration under Section 564(b)(1) of the Act, 21 U.S.C.section 360bbb-3(b)(1), unless the authorization is terminated  or revoked sooner.       Influenza A by PCR NEGATIVE NEGATIVE Final   Influenza B by PCR NEGATIVE NEGATIVE Final    Comment: (NOTE) The Xpert Xpress SARS-CoV-2/FLU/RSV plus assay is intended as an aid in the diagnosis of influenza from Nasopharyngeal swab specimens and should not be used as a sole basis for treatment. Nasal washings and aspirates are unacceptable for Xpert Xpress SARS-CoV-2/FLU/RSV testing.  Fact Sheet for Patients: EntrepreneurPulse.com.au  Fact Sheet for Healthcare Providers: IncredibleEmployment.be  This test is not yet approved or cleared by the [place] FDA and has been authorized for detection and/or diagnosis of SARS-CoV-2 by FDA under an Emergency Use Authorization (EUA). This EUA will remain in effect (meaning this test can be used)  for the duration of the COVID-19 declaration under Section 564(b)(1) of the Act, 21 U.S.C. section 360bbb-3(b)(1), unless the authorization is terminated or revoked.  Performed at [facility], [place], [place] [address]   Respiratory (~20 pathogens) panel by PCR     Status: None   Collection Time:  11/11/22  5:33 AM   Specimen: Anterior Nasal Swab; Respiratory  Result Value Ref Range Status   Adenovirus NOT DETECTED NOT DETECTED Final   Coronavirus 229E NOT DETECTED NOT DETECTED Final    Comment: (NOTE) The Coronavirus on the Respiratory Panel, DOES NOT test for the novel  Coronavirus (2019 nCoV)    Coronavirus HKU1 NOT DETECTED NOT DETECTED Final   Coronavirus NL63 NOT DETECTED NOT DETECTED Final   Coronavirus OC43 NOT DETECTED NOT DETECTED Final   Metapneumovirus NOT DETECTED NOT DETECTED Final   Rhinovirus / Enterovirus NOT DETECTED NOT DETECTED Final   Influenza A NOT DETECTED NOT DETECTED Final   Influenza B NOT DETECTED NOT DETECTED Final   Parainfluenza Virus 1 NOT DETECTED NOT DETECTED Final   Parainfluenza Virus 2 NOT DETECTED NOT DETECTED Final   Parainfluenza Virus 3 NOT DETECTED NOT DETECTED Final   Parainfluenza Virus 4 NOT DETECTED NOT DETECTED Final   Respiratory Syncytial Virus NOT DETECTED NOT DETECTED Final   Bordetella pertussis NOT DETECTED NOT DETECTED Final   Bordetella Parapertussis NOT DETECTED NOT DETECTED Final   Chlamydophila pneumoniae NOT DETECTED NOT DETECTED Final   Mycoplasma pneumoniae NOT DETECTED NOT DETECTED Final    Comment: Performed at [facility], [place]. [address], [place], [place] [address]  Expectorated Sputum Assessment w Gram Stain, Rflx to Resp Cult     Status: None   Collection Time: 11/11/22  6:58 AM   Specimen: Sputum  Result Value Ref Range Status   Specimen Description SPUTUM  Final   Special Requests NONE  Final   Sputum evaluation   Final    THIS SPECIMEN IS ACCEPTABLE FOR SPUTUM  CULTURE Performed at [facility], [address] [place], [place] [address]    Report Status 11/11/2022 FINAL  Final  Culture, Respiratory w Gram Stain     Status: None   Collection Time: 11/11/22  6:58 AM   Specimen: SPU  Result Value Ref Range Status   Specimen Description   Final    SPUTUM Performed at [facility], [address], [place], [place] [address]    Special Requests   Final    NONE Reflexed from [phone] Performed at [facility], [address] [place], [place] [address]    Gram Stain NO WBC SEEN FEW GRAM POSITIVE COCCI IN PAIRS   Final   Culture   Final    RARE STAPHYLOCOCCUS AUREUS WITHIN MIXED ORGANISMS Performed at [facility], [place] [address] [place], [place] [address]    Report Status 11/14/2022 FINAL  Final   Organism ID, Bacteria STAPHYLOCOCCUS AUREUS  Final      Susceptibility   Staphylococcus aureus - MIC*    CIPROFLOXACIN <=0.5 SENSITIVE Sensitive     ERYTHROMYCIN <=0.25 SENSITIVE Sensitive     GENTAMICIN <=0.5 SENSITIVE Sensitive     OXACILLIN 0.5 SENSITIVE Sensitive     TETRACYCLINE <=1 SENSITIVE Sensitive     VANCOMYCIN <=0.5 SENSITIVE Sensitive     TRIMETH/SULFA <=10 SENSITIVE Sensitive     CLINDAMYCIN <=0.25 SENSITIVE Sensitive     RIFAMPIN <=0.5 SENSITIVE Sensitive     Inducible Clindamycin NEGATIVE Sensitive     * RARE STAPHYLOCOCCUS AUREUS  Culture, blood (x 2)     Status: None (Preliminary result)   Collection Time: 11/11/22  8:09 AM   Specimen: BLOOD RIGHT ARM  Result Value Ref Range Status   Specimen Description BLOOD RIGHT ARM  Final   Special Requests  Final    BOTTLES DRAWN AEROBIC AND ANAEROBIC Blood Culture adequate volume   Culture   Final    NO GROWTH 3 DAYS Performed at [facility], [address] [place], [place] [address]    Report Status PENDING  Incomplete  Culture, blood (x 2)     Status: None (Preliminary result)   Collection Time: 11/11/22  9:15 AM   Specimen:  BLOOD RIGHT FOREARM  Result Value Ref Range Status   Specimen Description BLOOD RIGHT FOREARM  Final   Special Requests   Final    BOTTLES DRAWN AEROBIC AND ANAEROBIC Blood Culture adequate volume   Culture   Final    NO GROWTH 3 DAYS Performed at [facility], [address], [place], [place] [address]    Report Status PENDING  Incomplete  MRSA Next Gen by PCR, Nasal     Status: None   Collection Time: 11/12/22  1:46 PM   Specimen: Nasal Mucosa; Nasal Swab  Result Value Ref Range Status   MRSA by PCR Next Gen NOT DETECTED NOT DETECTED Final    Comment: (NOTE) The GeneXpert MRSA Assay (FDA approved for NASAL specimens only), is one component of a comprehensive MRSA colonization surveillance program. It is not intended to diagnose MRSA infection nor to guide or monitor treatment for MRSA infections. Test performance is not FDA approved in patients less than 92 years old. Performed at [facility], [address], [place],  [address]          Radiology Studies: CT Angio Chest PE W and/or Wo Contrast  Result Date: 11/11/2022 CLINICAL DATA:  46 year old female with history of shortness of breath and congestion. Mid chest pain. EXAM: CT ANGIOGRAPHY CHEST WITH CONTRAST TECHNIQUE: Multidetector CT imaging of the chest was performed using the standard protocol during bolus administration of intravenous contrast. Multiplanar CT image reconstructions and MIPs were obtained to evaluate the vascular anatomy. RADIATION DOSE REDUCTION: This exam was performed according to the departmental dose-optimization program which includes automated exposure control, adjustment of the mA and/or kV according to patient size and/or use of iterative reconstruction technique. CONTRAST:  48m OMNIPAQUE IOHEXOL 350 MG/ML SOLN COMPARISON:  CTA chest 09/25/2022. FINDINGS: Comment: Today's study is limited by considerable patient respiratory motion. Cardiovascular: Within the limitations  of today's examination, there is no central, lobar or proximal segmental sized filling defect. Distal segmental and subsegmental sized filling defects can not be entirely excluded on today's motion limited examination. Heart size is normal. There is no significant pericardial fluid, thickening or pericardial calcification. No atherosclerotic calcifications are noted in the thoracic aorta or the coronary arteries. Mediastinum/Nodes: No pathologically enlarged mediastinal or hilar lymph nodes. Patulous fluid-filled esophagus. No axillary lymphadenopathy. Lungs/Pleura: No acute consolidative airspace disease. No pleural effusions. No definite suspicious appearing pulmonary nodules or masses are noted on today's motion limited examination. Upper Abdomen: Diffuse low attenuation throughout the visualized hepatic parenchyma, indicative of a background of severe hepatic steatosis. Status post cholecystectomy. Musculoskeletal: Electronic device in the medial aspect of the left breast, presumably an implantable loop recorder. There are no aggressive appearing lytic or blastic lesions noted in the visualized portions of the skeleton. Review of the MIP images confirms the above findings. IMPRESSION: 1. Limited study secondary to patient respiratory motion. No central, lobar or proximal segmental sized pulmonary embolism identified. 2. No other acute findings noted in the thorax to account for the patient's symptoms. 3. Severe hepatic steatosis. Electronically Signed   By: DVinnie Langton  M.D.   On: 11/11/2022 06:28   DG Chest Portable 1 View  Result Date: 11/11/2022 CLINICAL DATA:  46 year old female with history of shortness of breath. EXAM: PORTABLE CHEST 1 VIEW COMPARISON:  Chest x-ray 09/25/2022. FINDINGS: Lung volumes are low. No consolidative airspace disease. No pleural effusions. No pneumothorax. No pulmonary nodule or mass noted. Pulmonary vasculature and the cardiomediastinal silhouette are within normal  limits. Electronic device projecting over the left hemithorax, presumably an implantable loop recorder. IMPRESSION: 1. Low lung volumes without radiographic evidence of acute cardiopulmonary disease. Electronically Signed   By: Vinnie Langton M.D.   On: 11/11/2022 05:05            LOS: 2 days     Emeterio Reeve, DO [facility] 11/14/2022, 12:56 PM    Dictation software may have been used to generate the above note. Typos may occur and escape review in typed/dictated notes. Please contact Dr Sheppard Coil directly for clarity if needed.  Staff may message me via secure chat in [place]  but this may not receive an immediate response,  please page me for urgent matters!  If 7PM-7AM, please contact night coverage [email or web address]

## 2022-11-14 NOTE — Progress Notes (Signed)
Spoke with portable equipment regarding status of kpad. Still none in house, not in use. Will continue to follow up

## 2022-11-15 DIAGNOSIS — E782 Mixed hyperlipidemia: Secondary | ICD-10-CM | POA: Diagnosis not present

## 2022-11-15 DIAGNOSIS — G43909 Migraine, unspecified, not intractable, without status migrainosus: Secondary | ICD-10-CM | POA: Diagnosis not present

## 2022-11-15 DIAGNOSIS — J209 Acute bronchitis, unspecified: Secondary | ICD-10-CM | POA: Diagnosis not present

## 2022-11-15 DIAGNOSIS — M059 Rheumatoid arthritis with rheumatoid factor, unspecified: Secondary | ICD-10-CM | POA: Diagnosis not present

## 2022-11-15 LAB — CBC
HCT: 30.3 % — ABNORMAL LOW (ref 36.0–46.0)
Hemoglobin: 10.1 g/dL — ABNORMAL LOW (ref 12.0–15.0)
MCH: 32.2 pg (ref 26.0–34.0)
MCHC: 33.3 g/dL (ref 30.0–36.0)
MCV: 96.5 fL (ref 80.0–100.0)
Platelets: 251 10*3/uL (ref 150–400)
RBC: 3.14 MIL/uL — ABNORMAL LOW (ref 3.87–5.11)
RDW: 14.3 % (ref 11.5–15.5)
WBC: 10.4 10*3/uL (ref 4.0–10.5)
nRBC: 0.2 % (ref 0.0–0.2)

## 2022-11-15 LAB — BASIC METABOLIC PANEL
Anion gap: 5 (ref 5–15)
BUN: 18 mg/dL (ref 6–20)
CO2: 25 mmol/L (ref 22–32)
Calcium: 8.1 mg/dL — ABNORMAL LOW (ref 8.9–10.3)
Chloride: 114 mmol/L — ABNORMAL HIGH (ref 98–111)
Creatinine, Ser: 0.69 mg/dL (ref 0.44–1.00)
GFR, Estimated: >60 mL/min (ref >60)
Glucose, Bld: 147 mg/dL — ABNORMAL HIGH (ref 70–99)
Potassium: 3.6 mmol/L (ref 3.5–5.1)
Sodium: 144 mmol/L (ref 135–145)

## 2022-11-15 MED ORDER — METHYLPREDNISOLONE SODIUM SUCC 40 MG IJ SOLR
40.0000 mg | Freq: Two times a day (BID) | INTRAMUSCULAR | Status: DC
  Administered 2022-11-15 – 2022-11-16 (×2): 40 mg via INTRAVENOUS
  Filled 2022-11-15 (×2): qty 1

## 2022-11-15 MED ORDER — IPRATROPIUM-ALBUTEROL 0.5-2.5 (3) MG/3ML IN SOLN
3.0000 mL | Freq: Four times a day (QID) | RESPIRATORY_TRACT | Status: DC
  Administered 2022-11-15 (×2): 3 mL via RESPIRATORY_TRACT
  Filled 2022-11-15 (×2): qty 3

## 2022-11-15 MED ORDER — ACETYLCYSTEINE 20 % IN SOLN
4.0000 mL | Freq: Once | RESPIRATORY_TRACT | Status: AC
  Administered 2022-11-16: 4 mL via RESPIRATORY_TRACT
  Filled 2022-11-15: qty 4

## 2022-11-15 NOTE — Progress Notes (Addendum)
PROGRESS NOTE    [name] Horn   NAT:[id number] DOB: 1976/01/08  DOA: 11/11/2022 Date of Service: 11/15/22 PCP: Dierdre Searles, PA-C     Brief Narrative / Hospital Course:  [name] Horn is a 46 y.o. 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 tach and atrial tachycardia, has loop recorder in place), obesity with body mass index (BMI) of 30.73, oral thrush, who presents to ED from home on 11/11/2022 with SOB x1 day assoc w/ cough, wheeze, subjective f/c, pleuritic CP. Patient used to take methotrexate, prednisone and IVIG for compromised immune system.  Due to oral thrush, her immunologist at the [facility] hold off her methotrexate, prednisone and IVIG. She's also following w/ [facility] rheum. Currently patient is taking Dupixent once a week.  Last dose was on 11/10.  11/17:  WBC 9.2, troponin level 3, BNP 15, negative COVID PCR, negative flu a and B, electrolytes renal function okay, temperature 99.4, blood pressure 119/85, heart rate 92, RR 22, oxygen saturation 97% on room air.  Chest x-ray showed lower lobe lung volume without infiltration.  CTA is limited study, negative for PE. Received 1 dose of Rocephin and azithromycin in ED. Patient was placed on telemetry bed for observation. Started on IV Vancomycin and cefepime due to immunocompromised status, as well as IV Solu-Medrol, gentle IV fluids given meeting sepsis criteria. Respiraoty viral panel negative. Resp/Sputum Cx and BCx pending.  11/18: VSS, remains on room air but feels more comfortable on O2. SEPSIS CRITERIA RESOLVED. No concerns on AM labs, elevated WBC likely d/t IV steroids. Lactate trending down. Awaiting resp/sputum Cx and BCx. Reprots chronic resistant thrush s/p bx w/ ENT at [facility], will maintain  current antifungals and add pain control w viscous lidocaine and cepacol.  11/19: still significant coughing w/ sputum, adding Mucomyst. Lactate trending down but still elevated at 2.2. WBC improving but still elevated at 15.6. BCx NGx2d. Resp Cx reincubated for better growth. Sputum Cx pending. Increased nebulizers and added Mucomyst  11/20-11/21: breathing a bit better w/ the mucomyst and lung exam slightly improved but still significant productive cough.  11/21: Given no significant improvement and not needing O2, consulted pulmonary to see if they have any input re: abx, appropriate discharge Rx regimen, question need for further imaging?   Consultants:  Pulmonology   Procedures: none  Antibiotics:  11/17: azithromycin x1 dose in ED, ceftriaxone x1 dose in ED, vancomycin in ED.  Continue fluconazole three days per week for resistant thrush. Started cefepime and continued vanc once admitted.  11/18:  received vancomycin and continued cefepime.  11/19: no vanc. 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 tach and atrial tachycardia   Oral thrush   Obesity with body mass index (BMI) of 30.0 to 39.9  Acute bronchitis and severe sepsis/SIRS - sepsis resolved:  -Abx as above, currently on amox-clav only, today is day 5 total abx  -Solu-Medrol 40 mg twice daily to  consider taper on discharge  -Mucinex for cough  -Bronchodilators frequency increased 11/19 -added Mucomyst 11/19 and pt reports improvement w/ this  -incentive spirometry and flutter valve  -Urine S. pneumococcal antigen  --> negative   -Follow up blood culture x2 --> NG x2d -Follow sputum/respiratory culture --> rare staph aureus pan-sensitive  -Check respiratory virus panel --> negative  -trend lactic acid level --> improved  -Given no significant improvement and not needing O2, consulted pulmonary to see if they have any input re: abx, appropriate discharge Rx regimen, question need for further imaging?    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   Anxiety state -As needed Ativan   Migraine -As needed sumatriptan -Continue home Topamax for prophylaxis   Eosinophilic esophagitis -Pepcid and Carafate   Cardiac arrhythmia_nonsustained V. tach and atrial tachycardia -Patient has loop recorder in place -Continue metoprolol   Oral thrush -Continue home fluconazole and nystatin   Obesity with body mass index (BMI) of 30.0 to 39.9: BMI= 30.73   and BW= 81.2 kg -Diet and exercise.   -Encourage to lose weight.    DVT prophylaxis: enoxaparin Pertinent IV fluids/nutrition: gentl IV fluids w/ NS --> plan d/c likely tomorrow  Central lines / invasive devices: none  Code Status: FULL CODE  Disposition: inpatient  TOC needs: none at this time Barriers to discharge / significant pending items: await improvement in respiratory status, anticipate d/c in 1-2 days              Subjective:  Patient reports still coughing and productive of yellow sputum - slightly better, mucus seems to be thinning a bit but not much better from yesterday.  Denies CP. Reports mild SOB.  Pain controlled other than some mouth pain which is stable  Denies new weakness.  Tolerating diet.  Reports no concerns w/  urination/defecation.    Family Communication: none at this time     Objective Findings:  Vitals:   11/14/22 2012 11/14/22 2117 11/15/22 0051 11/15/22 0526  BP:  101/72  (!) 129/98  Pulse:  93  79  Resp:  17  16  Temp:  98.4 F (36.9 C)  98 F (36.7 C)  TempSrc:  Oral    SpO2: 96% 97% 93% 98%  Weight:      Height:       No intake or output data in the 24 hours ending 11/15/22 1507   Filed Weights   11/11/22 0422  Weight: 81.2 kg    Examination: Constitutional:  VS as above General Appearance: alert, well-developed, well-nourished, NAD Respiratory: Normal respiratory effort +scattered moderate wheeze +scattered rhonchi - improved No rales Cardiovascular: S1/S2 normal No murmur No rub/gallop auscultated Gastrointestinal: No tenderness Musculoskeletal:  No clubbing/cyanosis of digits Symmetrical movement in all extremities Neurological: No cranial nerve deficit on limited exam Alert Psychiatric: Normal judgment/insight Normal mood and affect       Scheduled Medications:   acetylcysteine  4 mL Nebulization Once   amoxicillin-clavulanate  1 tablet Oral Q12H   baclofen  10 mg Oral QHS   cycloSPORINE  1 drop Both Eyes BID   enoxaparin (LOVENOX) injection  40 mg Subcutaneous Q24H   famotidine  40 mg Oral QHS  fluconazole  300 mg Oral Q M,W,F   folic acid  2 mg Oral Daily   ipratropium-albuterol  3 mL Nebulization Q6H   methylPREDNISolone (SOLU-MEDROL) injection  40 mg Intravenous Q12H   metoCLOPramide  10 mg Oral BID   metoprolol succinate  50 mg Oral Daily   nystatin  10 mL Oral TID   olopatadine  1 drop Both Eyes BID   pantoprazole  40 mg Oral BID   polyvinyl alcohol  1 drop Both Eyes BID   pravastatin  40 mg Oral QHS   sucralfate  1 g Oral QID   topiramate  100 mg Oral Daily   topiramate  25 mg Oral QHS    Continuous Infusions:  sodium chloride Stopped (11/12/22 0717)   sodium chloride 75 mL/hr at 11/14/22 1329    PRN Medications:   sodium chloride, acetaminophen, albuterol, alum & mag hydroxide-simeth, amphetamine-dextroamphetamine, atropine, chlorpheniramine-HYDROcodone, cyclobenzaprine, dextromethorphan-guaiFENesin, dicyclomine, lidocaine, LORazepam, meloxicam, menthol-cetylpyridinium, nystatin, ondansetron (ZOFRAN) IV, oxyCODONE-acetaminophen **AND** oxyCODONE, sodium chloride, SUMAtriptan, traZODone, triamcinolone cream  Antimicrobials:  Anti-infectives (From admission, onward)    Start     Dose/Rate Route Frequency Ordered Stop   11/14/22 2200  amoxicillin-clavulanate (AUGMENTIN) 875-125 MG per tablet 1 tablet        1 tablet Oral Every 12 hours 11/14/22 1401     11/12/22 1000  vancomycin (VANCOREADY) IVPB 1750 mg/350 mL  Status:  Discontinued        1,750 mg 175 mL/hr over 120 Minutes Intravenous Every 24 hours 11/11/22 1433 11/13/22 1004   11/12/22 0700  cefTRIAXone (ROCEPHIN) 2 g in sodium chloride 0.9 % 100 mL IVPB  Status:  Discontinued        2 g 200 mL/hr over 30 Minutes Intravenous Every 24 hours 11/11/22 0739 11/11/22 0849   11/12/22 0700  azithromycin (ZITHROMAX) 500 mg in sodium chloride 0.9 % 250 mL IVPB  Status:  Discontinued        500 mg 250 mL/hr over 60 Minutes Intravenous Every 24 hours 11/11/22 0739 11/11/22 0849   11/11/22 1400  ceFEPIme (MAXIPIME) 2 g in sodium chloride 0.9 % 100 mL IVPB        2 g 200 mL/hr over 30 Minutes Intravenous Every 8 hours 11/11/22 0910 11/14/22 1414   11/11/22 1000  fluconazole (DIFLUCAN) tablet 300 mg        300 mg Oral Every M-W-F 11/11/22 0843     11/11/22 1000  vancomycin (VANCOREADY) IVPB 1750 mg/350 mL        1,750 mg 175 mL/hr over 120 Minutes Intravenous  Once 11/11/22 0908 11/11/22 1201   11/11/22 0700  cefTRIAXone (ROCEPHIN) 2 g in sodium chloride 0.9 % 100 mL IVPB        2 g 200 mL/hr over 30 Minutes Intravenous  Once 11/11/22 0646 11/11/22 0733   11/11/22 0700  azithromycin (ZITHROMAX) 500 mg in sodium chloride 0.9 % 250 mL IVPB        500 mg 250  mL/hr over 60 Minutes Intravenous  Once 11/11/22 0646 11/11/22 0757           Data Reviewed: I have personally reviewed following labs and imaging studies  CBC: Recent Labs  Lab 11/11/22 0429 11/12/22 0556 11/13/22 0519 11/15/22 0341  WBC 9.2 18.0* 15.6* 10.4  HGB 14.0 11.3* 10.5* 10.1*  HCT 41.1 33.6* 31.6* 30.3*  MCV 92.2 93.9 94.6 96.5  PLT 193 271 241 161   Basic Metabolic Panel: Recent Labs  Lab 11/11/22 0429 11/12/22  0300 11/13/22 0519 11/15/22 0341  NA 137 137 139 144  K 3.7 4.4 4.2 3.6  CL 98 106 111 114*  CO2 '30 23 23 25  '$ GLUCOSE 116* 164* 143* 147*  BUN 10 22* 20 18  CREATININE 0.90 1.01* 0.79 0.69  CALCIUM 10.0 8.8* 8.7* 8.1*   GFR: Estimated Creatinine Clearance: 88.7 mL/min (by C-G formula based on SCr of 0.69 mg/dL). Liver Function Tests: No results for input(s): "AST", "ALT", "ALKPHOS", "BILITOT", "PROT", "ALBUMIN" in the last 168 hours. No results for input(s): "LIPASE", "AMYLASE" in the last 168 hours. No results for input(s): "AMMONIA" in the last 168 hours. Coagulation Profile: No results for input(s): "INR", "PROTIME" in the last 168 hours. Cardiac Enzymes: No results for input(s): "CKTOTAL", "CKMB", "CKMBINDEX", "TROPONINI" in the last 168 hours. BNP (last 3 results) No results for input(s): "PROBNP" in the last 8760 hours. HbA1C: No results for input(s): "HGBA1C" in the last 72 hours. CBG: No results for input(s): "GLUCAP" in the last 168 hours. Lipid Profile: No results for input(s): "CHOL", "HDL", "LDLCALC", "TRIG", "CHOLHDL", "LDLDIRECT" in the last 72 hours. Thyroid Function Tests: No results for input(s): "TSH", "T4TOTAL", "FREET4", "T3FREE", "THYROIDAB" in the last 72 hours. Anemia Panel: No results for input(s): "VITAMINB12", "FOLATE", "FERRITIN", "TIBC", "IRON", "RETICCTPCT" in the last 72 hours. Most Recent Urinalysis On File:     Component Value Date/Time   COLORURINE YELLOW (A) 06/28/2022 1212   APPEARANCEUR CLEAR (A)  06/28/2022 1212   LABSPEC 1.010 06/28/2022 1212   PHURINE 7.0 06/28/2022 1212   GLUCOSEU NEGATIVE 06/28/2022 1212   HGBUR NEGATIVE 06/28/2022 1212   HGBUR negative 07/25/2007 [place] 06/28/2022 1212   KETONESUR NEGATIVE 06/28/2022 1212   PROTEINUR NEGATIVE 06/28/2022 1212   UROBILINOGEN 1.0 05/27/2009 1500   NITRITE NEGATIVE 06/28/2022 1212   LEUKOCYTESUR SMALL (A) 06/28/2022 1212   Sepsis Labs: '@LABRCNTIP'$ (procalcitonin:4,lacticidven:4)  Recent Results (from the past 240 hour(s))  Resp Panel by RT-PCR (Flu A&B, Covid) Anterior Nasal Swab     Status: None   Collection Time: 11/11/22  5:33 AM   Specimen: Anterior Nasal Swab  Result Value Ref Range Status   SARS Coronavirus 2 by RT PCR NEGATIVE NEGATIVE Final    Comment: (NOTE) SARS-CoV-2 target nucleic acids are NOT DETECTED.  The SARS-CoV-2 RNA is generally detectable in upper respiratory specimens during the acute phase of infection. The lowest concentration of SARS-CoV-2 viral copies this assay can detect is 138 copies/mL. A negative result does not preclude SARS-Cov-2 infection and should not be used as the sole basis for treatment or other patient management decisions. A negative result may occur with  improper specimen collection/handling, submission of specimen other than nasopharyngeal swab, presence of viral mutation(s) within the areas targeted by this assay, and inadequate number of viral copies(<138 copies/mL). A negative result must be combined with clinical observations, patient history, and epidemiological information. The expected result is Negative.  Fact Sheet for Patients:  EntrepreneurPulse.com.au  Fact Sheet for Healthcare Providers:  IncredibleEmployment.be  This test is no t yet approved or cleared by the [place] FDA and  has been authorized for detection and/or diagnosis of SARS-CoV-2 by FDA under an Emergency Use Authorization (EUA). This  EUA will remain  in effect (meaning this test can be used) for the duration of the COVID-19 declaration under Section 564(b)(1) of the Act, 21 U.S.C.section 360bbb-3(b)(1), unless the authorization is terminated  or revoked sooner.       Influenza A by PCR NEGATIVE NEGATIVE  Final   Influenza B by PCR NEGATIVE NEGATIVE Final    Comment: (NOTE) The Xpert Xpress SARS-CoV-2/FLU/RSV plus assay is intended as an aid in the diagnosis of influenza from Nasopharyngeal swab specimens and should not be used as a sole basis for treatment. Nasal washings and aspirates are unacceptable for Xpert Xpress SARS-CoV-2/FLU/RSV testing.  Fact Sheet for Patients: EntrepreneurPulse.com.au  Fact Sheet for Healthcare Providers: IncredibleEmployment.be  This test is not yet approved or cleared by the [place] FDA and has been authorized for detection and/or diagnosis of SARS-CoV-2 by FDA under an Emergency Use Authorization (EUA). This EUA will remain in effect (meaning this test can be used) for the duration of the COVID-19 declaration under Section 564(b)(1) of the Act, 21 U.S.C. section 360bbb-3(b)(1), unless the authorization is terminated or revoked.  Performed at [facility], [address] [place], [place] [address]   Respiratory (~20 pathogens) panel by PCR     Status: None   Collection Time: 11/11/22  5:33 AM   Specimen: Anterior Nasal Swab; Respiratory  Result Value Ref Range Status   Adenovirus NOT DETECTED NOT DETECTED Final   Coronavirus 229E NOT DETECTED NOT DETECTED Final    Comment: (NOTE) The Coronavirus on the Respiratory Panel, DOES NOT test for the novel  Coronavirus (2019 nCoV)    Coronavirus HKU1 NOT DETECTED NOT DETECTED Final   Coronavirus NL63 NOT DETECTED NOT DETECTED Final   Coronavirus OC43 NOT DETECTED NOT DETECTED Final   Metapneumovirus NOT DETECTED NOT DETECTED Final   Rhinovirus / Enterovirus NOT DETECTED NOT  DETECTED Final   Influenza A NOT DETECTED NOT DETECTED Final   Influenza B NOT DETECTED NOT DETECTED Final   Parainfluenza Virus 1 NOT DETECTED NOT DETECTED Final   Parainfluenza Virus 2 NOT DETECTED NOT DETECTED Final   Parainfluenza Virus 3 NOT DETECTED NOT DETECTED Final   Parainfluenza Virus 4 NOT DETECTED NOT DETECTED Final   Respiratory Syncytial Virus NOT DETECTED NOT DETECTED Final   Bordetella pertussis NOT DETECTED NOT DETECTED Final   Bordetella Parapertussis NOT DETECTED NOT DETECTED Final   Chlamydophila pneumoniae NOT DETECTED NOT DETECTED Final   Mycoplasma pneumoniae NOT DETECTED NOT DETECTED Final    Comment: Performed at [facility], [place]. [address] [place], [place] [address]  Expectorated Sputum Assessment w Gram Stain, Rflx to Resp Cult     Status: None   Collection Time: 11/11/22  6:58 AM   Specimen: Sputum  Result Value Ref Range Status   Specimen Description SPUTUM  Final   Special Requests NONE  Final   Sputum evaluation   Final    THIS SPECIMEN IS ACCEPTABLE FOR SPUTUM CULTURE Performed at [facility], [address] [place], [place] [address]    Report Status 11/11/2022 FINAL  Final  Culture, Respiratory w Gram Stain     Status: None   Collection Time: 11/11/22  6:58 AM   Specimen: SPU  Result Value Ref Range Status   Specimen Description   Final    SPUTUM Performed at [facility], [address], [place], [place] [address]    Special Requests   Final    NONE Reflexed from [id number] Performed at [facility], [address] [place], [place] [address]    Gram Stain NO WBC SEEN FEW GRAM POSITIVE COCCI IN PAIRS   Final   Culture   Final    RARE STAPHYLOCOCCUS AUREUS WITHIN MIXED ORGANISMS Performed at [facility], [place]  [address] [place], [place] [address]    Report Status 11/14/2022 FINAL  Final   Organism ID, Bacteria STAPHYLOCOCCUS AUREUS  Final      Susceptibility   Staphylococcus aureus - MIC*     CIPROFLOXACIN <=0.5 SENSITIVE Sensitive     ERYTHROMYCIN <=0.25 SENSITIVE Sensitive     GENTAMICIN <=0.5 SENSITIVE Sensitive     OXACILLIN 0.5 SENSITIVE Sensitive     TETRACYCLINE <=1 SENSITIVE Sensitive     VANCOMYCIN <=0.5 SENSITIVE Sensitive     TRIMETH/SULFA <=10 SENSITIVE Sensitive     CLINDAMYCIN <=0.25 SENSITIVE Sensitive     RIFAMPIN <=0.5 SENSITIVE Sensitive     Inducible Clindamycin NEGATIVE Sensitive     * RARE STAPHYLOCOCCUS AUREUS  Culture, blood (x 2)     Status: None (Preliminary result)   Collection Time: 11/11/22  8:09 AM   Specimen: BLOOD RIGHT ARM  Result Value Ref Range Status   Specimen Description BLOOD RIGHT ARM  Final   Special Requests   Final    BOTTLES DRAWN AEROBIC AND ANAEROBIC Blood Culture adequate volume   Culture   Final    NO GROWTH 4 DAYS Performed at [facility], [address], [place], [place] [address]    Report Status PENDING  Incomplete  Culture, blood (x 2)     Status: None (Preliminary result)   Collection Time: 11/11/22  9:15 AM   Specimen: BLOOD RIGHT FOREARM  Result Value Ref Range Status   Specimen Description BLOOD RIGHT FOREARM  Final   Special Requests   Final    BOTTLES DRAWN AEROBIC AND ANAEROBIC Blood Culture adequate volume   Culture   Final    NO GROWTH 4 DAYS Performed at [facility], [address] [place], [place] [address]    Report Status PENDING  Incomplete  MRSA Next Gen by PCR, Nasal     Status: None   Collection Time: 11/12/22  1:46 PM   Specimen: Nasal Mucosa; Nasal Swab  Result Value Ref Range Status   MRSA by PCR Next Gen NOT DETECTED NOT DETECTED Final    Comment: (NOTE) The GeneXpert MRSA Assay (FDA approved for NASAL specimens only), is one component of a comprehensive MRSA colonization surveillance program. It is not intended to diagnose MRSA infection nor to guide or monitor treatment for MRSA infections. Test performance is not FDA approved in patients less than 73  years old. Performed at [facility], [address], [place], [place] [address]          Radiology Studies: CT Angio Chest PE W and/or Wo Contrast  Result Date: 11/11/2022 CLINICAL DATA:  46 year old female with history of shortness of breath and congestion. Mid chest pain. EXAM: CT ANGIOGRAPHY CHEST WITH CONTRAST TECHNIQUE: Multidetector CT imaging of the chest was performed using the standard protocol during bolus administration of intravenous contrast. Multiplanar CT image reconstructions and MIPs were obtained to evaluate the vascular anatomy. RADIATION DOSE REDUCTION: This exam was performed according to the departmental dose-optimization program which includes automated exposure control, adjustment of the mA and/or kV according to patient size and/or use of iterative reconstruction technique. CONTRAST:  48m OMNIPAQUE IOHEXOL 350 MG/ML SOLN COMPARISON:  CTA chest 09/25/2022. FINDINGS: Comment: Today's study is limited by considerable patient respiratory motion. Cardiovascular: Within the limitations of today's examination, there is no central, lobar or proximal segmental sized filling defect. Distal segmental and subsegmental sized filling defects can not be entirely excluded on today's motion limited examination. Heart size is normal. There is  no significant pericardial fluid, thickening or pericardial calcification. No atherosclerotic calcifications are noted in the thoracic aorta or the coronary arteries. Mediastinum/Nodes: No pathologically enlarged mediastinal or hilar lymph nodes. Patulous fluid-filled esophagus. No axillary lymphadenopathy. Lungs/Pleura: No acute consolidative airspace disease. No pleural effusions. No definite suspicious appearing pulmonary nodules or masses are noted on today's motion limited examination. Upper Abdomen: Diffuse low attenuation throughout the visualized hepatic parenchyma, indicative of a background of severe hepatic steatosis. Status post  cholecystectomy. Musculoskeletal: Electronic device in the medial aspect of the left breast, presumably an implantable loop recorder. There are no aggressive appearing lytic or blastic lesions noted in the visualized portions of the skeleton. Review of the MIP images confirms the above findings. IMPRESSION: 1. Limited study secondary to patient respiratory motion. No central, lobar or proximal segmental sized pulmonary embolism identified. 2. No other acute findings noted in the thorax to account for the patient's symptoms. 3. Severe hepatic steatosis. Electronically Signed   By: Vinnie Langton M.D.   On: 11/11/2022 06:28   DG Chest Portable 1 View  Result Date: 11/11/2022 CLINICAL DATA:  46 year old female with history of shortness of breath. EXAM: PORTABLE CHEST 1 VIEW COMPARISON:  Chest x-ray 09/25/2022. FINDINGS: Lung volumes are low. No consolidative airspace disease. No pleural effusions. No pneumothorax. No pulmonary nodule or mass noted. Pulmonary vasculature and the cardiomediastinal silhouette are within normal limits. Electronic device projecting over the left hemithorax, presumably an implantable loop recorder. IMPRESSION: 1. Low lung volumes without radiographic evidence of acute cardiopulmonary disease. Electronically Signed   By: Vinnie Langton M.D.   On: 11/11/2022 05:05            LOS: 3 days     Emeterio Reeve, DO [facility] 11/15/2022, 3:07 PM    Dictation software may have been used to generate the above note. Typos may occur and escape review in typed/dictated notes. Please contact Dr Sheppard Coil directly for clarity if needed.  Staff may message me via secure chat in [place]  but this may not receive an immediate response,  please page me for urgent matters!  If 7PM-7AM, please contact night coverage [email or web address]

## 2022-11-16 ENCOUNTER — Inpatient Hospital Stay: Payer: BC Managed Care – PPO

## 2022-11-16 DIAGNOSIS — J209 Acute bronchitis, unspecified: Secondary | ICD-10-CM | POA: Diagnosis not present

## 2022-11-16 LAB — CULTURE, BLOOD (ROUTINE X 2)
Culture: NO GROWTH
Culture: NO GROWTH
Special Requests: ADEQUATE
Special Requests: ADEQUATE

## 2022-11-16 MED ORDER — FUROSEMIDE 40 MG PO TABS
60.0000 mg | ORAL_TABLET | Freq: Two times a day (BID) | ORAL | Status: DC
  Administered 2022-11-16 (×2): 60 mg via ORAL
  Filled 2022-11-16 (×3): qty 1

## 2022-11-16 MED ORDER — IPRATROPIUM-ALBUTEROL 0.5-2.5 (3) MG/3ML IN SOLN
3.0000 mL | Freq: Two times a day (BID) | RESPIRATORY_TRACT | Status: DC
  Administered 2022-11-16 – 2022-11-20 (×9): 3 mL via RESPIRATORY_TRACT
  Filled 2022-11-16 (×5): qty 3
  Filled 2022-11-16: qty 30
  Filled 2022-11-16 (×5): qty 3

## 2022-11-16 NOTE — Plan of Care (Signed)

## 2022-11-16 NOTE — Consult Note (Signed)
NAME:  [name] Horn, MRN:  [id number], DOB:  [birth year], LOS: 4 ADMISSION DATE:  11/11/2022, CONSULTATION DATE:  11/16/2022 REFERRING MD:  Shelly Coss, CHIEF COMPLAINT:  cough   History of Present Illness:  Ms. Keasling is a pleasant 46 year old female presenting to the hospital for cough and shortness of breath.  She reports that her symptoms are chronic with recurrent bouts of acute decompensation. Over the past 6 months to a year, she's had multiple exacerbations of her symptoms prompting multiple doses of antibiotics and steroids. She feels that she never gets back to baseline with her cough persistent. This time, she woke up and was acutely short of breath. She was brought in to the hospital by her husband and admitted for management. She was started on nebulizers, steroids, and antibiotics with some improvement, but her cough persists. She is also performing pulmonary toilet with incentive spirometry and flutter valve device. Imaging has included a chest CT that was not notable for any infiltrates.  She tells me she's had cough productive of whitish sputum and associated with abdominal pain and chest discomfort. She has not had any fevers, chills, or night sweats. Her symptoms are acute on chronic. Dynamic airway CT was performed today.  She has a history of immune deficiency for which she follows at the [facility]. 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 She tells me that she had asthma as a child but has since outgrown it. She does not use any inhalers at baseline and dose not have any allergies. She is a non-smoker.  Pertinent  Medical History  -EOE -antibody deficiency -RA -HLD -Lymphedema  Objective   Blood pressure 125/85, pulse 84, temperature 98.7 F (37.1 C), resp. rate 19, height '5\' 4"'$  (1.626 m), weight 81.2 kg, SpO2 100 %.        Intake/Output Summary (Last 24 hours) at 11/16/2022 1549 Last data filed at 11/16/2022  0446 Gross per 24 hour  Intake 5112.7 ml  Output --  Net 5112.7 ml   Filed Weights   11/11/22 0422  Weight: 81.2 kg    Examination: Physical Exam Vitals reviewed.  Constitutional:      Appearance: She is obese.  HENT:     Head: Normocephalic.     Mouth/Throat:     Mouth: Mucous membranes are moist.  Cardiovascular:     Rate and Rhythm: Normal rate and regular rhythm.  Pulmonary:     Breath sounds: Wheezing (expiratory wheezing) present.  Abdominal:     General: There is distension.     Palpations: Abdomen is soft.  Skin:    General: Skin is warm.  Neurological:     General: No focal deficit present.     Mental Status: She is alert and oriented to person, place, and time.     Assessment & Plan:   #Shortness of breath #Tracheobronchomalacia  Presents with shortness of breath and cough that is recurrent over the past few months. She was wheezy on presentation and initially treated for pneumonia as well as asthma exacerbation with steroids. She has improved but continues to have symptoms of cough and shortness of breath. On exam, she has expiratory wheezing.  Her history and presentation is concerning for tracheobronchomalacia that is confirmed on dynamic airway CT. She did have a history of asthma in the past and historically her highest eosinophil count is [facility] at [facility]. I would recommend treatment of asthma empirically pending PFT's  with Symbicort 80-4.5 two puffs bid in addition to every 4 hours as needed. I would recommend discontinuation of antibiotics and steroids at the moment given no sign of active infection and continuing with pulmonary toilet as she is currently doing (OOB to chair, IS, flutter device).  Furthermore, given the tracheobronchomalacia, would recommend referral to interventional pulmonology and thoracic surgery for a rigid bronchoscopy to deploy a silicone Y-stent trial followed by robotic assisted tracheobronchoplasty if trial is successful. I did  discuss with the patient that we don't have IP at [facility] and recommended she establish care with [facility] or [facility]. Finally, CPAP would be an option for her nocturnally but this would be second line if she fails (or is not a candidate) for Y stent trial and subsequent tracheobronchoplasty.   Labs   CBC: Recent Labs  Lab 11/11/22 0429 11/12/22 0556 11/13/22 0519 11/15/22 0341  WBC 9.2 18.0* 15.6* 10.4  HGB 14.0 11.3* 10.5* 10.1*  HCT 41.1 33.6* 31.6* 30.3*  MCV 92.2 93.9 94.6 96.5  PLT 193 271 241 161    Basic Metabolic Panel: Recent Labs  Lab 11/11/22 0429 11/12/22 0556 11/13/22 0519 11/15/22 0341  NA 137 137 139 144  K 3.7 4.4 4.2 3.6  CL 98 106 111 114*  CO2 '30 23 23 25  '$ GLUCOSE 116* 164* 143* 147*  BUN 10 22* 20 18  CREATININE 0.90 1.01* 0.79 0.69  CALCIUM 10.0 8.8* 8.7* 8.1*   GFR: Estimated Creatinine Clearance: 88.7 mL/min (by C-G formula based on SCr of 0.69 mg/dL). Recent Labs  Lab 11/11/22 0429 11/11/22 0804 11/11/22 0915 11/12/22 0556 11/12/22 0745 11/13/22 0519 11/15/22 0341  PROCALCITON 0.27  --   --   --   --   --   --   WBC 9.2  --   --  18.0*  --  15.6* 10.4  LATICACIDVEN  --  3.8* 4.9*  --  3.7* 2.2*  --     Liver Function Tests: No results for input(s): "AST", "ALT", "ALKPHOS", "BILITOT", "PROT", "ALBUMIN" in the last 168 hours. No results for input(s): "LIPASE", "AMYLASE" in the last 168 hours. No results for input(s): "AMMONIA" in the last 168 hours.  ABG    Component Value Date/Time   TCO2 25 04/25/2017 1429     Coagulation Profile: No results for input(s): "INR", "PROTIME" in the last 168 hours.  Cardiac Enzymes: No results for input(s): "CKTOTAL", "CKMB", "CKMBINDEX", "TROPONINI" in the last 168 hours.  HbA1C: No results found for: "HGBA1C"  CBG: No results for input(s): "GLUCAP" in the last 168 hours.  Review of Systems:   Review of Systems  Constitutional:  Negative for chills and fever.  Respiratory:  Positive for  cough, sputum production, shortness of breath and wheezing. Negative for hemoptysis.     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   Surgical History:   Past Surgical History:  Procedure Laterality Date   APPENDECTOMY     cheek biopsy  11/2020   CHOLECYSTECTOMY     ESOPHAGOGASTRODUODENOSCOPY N/A 04/30/2017   Procedure: ESOPHAGOGASTRODUODENOSCOPY (EGD);  Surgeon: Teena Irani, MD;  Location: [facility] ENDOSCOPY;  Service: Endoscopy;  Laterality: N/A;   HERNIA REPAIR  x2   KNEE SURGERY     x3   MINIMALLY INVASIVE FORAMINOTOMY CERVICAL SPINE     C6-T1, Nitka   SAVORY DILATION N/A 04/30/2017   Procedure: SAVORY DILATION;  Surgeon: Teena Irani, MD;  Location: [place];  Service: Endoscopy;  Laterality: N/A;   TONSILLECTOMY     TOTAL ABDOMINAL HYSTERECTOMY     Sarcoma, s/p XRT     Social History:   reports that she has never smoked. She has been exposed to tobacco smoke. She has never used smokeless tobacco. She reports that she does not currently use alcohol. She reports current drug use.   Family History:  Her family history includes Angioedema in her maternal grandfather and mother; Arrhythmia in her father; Colon cancer in an other family member; Colonic polyp in her mother; Eczema in her mother; Hashimoto's thyroiditis in her mother; Hyperlipidemia in an other family member; Hypertension in an other family member; Throat cancer in her father.   Allergies Allergies  Allergen Reactions   Other      Dermabond surgical glue.    Sulfa Antibiotics Anaphylaxis, Shortness Of Breath, Swelling and Rash    Angioedema (also)   Sulfonamide Derivatives Hives, Shortness Of Breath and Swelling    TONGUE SWELLS   Benadryl [Diphenhydramine Hcl] Other (See Comments)    Hyperactivity    Silicone     Watch band      Home Medications  Prior to Admission medications   Medication Sig Start Date End Date Taking? Authorizing Provider  baclofen (LIORESAL) 10 MG tablet Take 10 mg by mouth at bedtime. 02/28/20  Yes [provider]  Bepotastine Besilate 1.5 % SOLN Place 1 drop into both eyes 2 (two) times a day.   Yes [provider]  cyclobenzaprine (FLEXERIL) 10 MG tablet Take 1 tablet by mouth 3 (three) times daily as needed. 05/05/21  Yes [provider]  cycloSPORINE (RESTASIS) 0.05 % ophthalmic emulsion Place 1 drop into both eyes 2 (two) times daily.   Yes [provider]  dicyclomine (BENTYL) 10 MG capsule Take 10 mg by mouth daily as needed for nausea/vomiting. 01/02/19  Yes [provider]  famotidine (PEPCID) 40 MG tablet Take 40 mg by mouth at bedtime. 05/02/21  Yes [provider]  folic acid (FOLVITE) 1 MG tablet Take 2 tablets (2 mg total) by mouth daily. 10/30/19  Yes Deveshwar, Abel Presto, MD  furosemide (LASIX) 20 MG tablet Take 60 mg by mouth 2 (two) times daily.   Yes [provider]  magic mouthwash (nystatin, lidocaine, diphenhydrAMINE, alum & mag hydroxide) suspension Swish and spit 5 mLs 3 (three) times daily as needed for mouth pain. 06/10/21  Yes Tommy Medal, Lavell Islam, MD  meloxicam (MOBIC) 15 MG tablet Take 1 tablet (15 mg total) by mouth daily. Patient taking differently: Take 15 mg by mouth as needed for pain. 07/25/21  Yes Pollina, Gwenyth Allegra, MD  metoCLOPramide (REGLAN) 10 MG tablet Take 10 mg by mouth 2 (two) times daily.   Yes [provider]  metoprolol succinate (TOPROL-XL) 25 MG 24 hr tablet Take 50 mg by mouth  daily.   Yes [provider]  nystatin (MYCOSTATIN) 100000 UNIT/ML suspension Take 10 mLs (1,000,000 Units total) by mouth 3 (three) times daily. 12/07/20  Yes Truman Hayward, MD  nystatin powder Apply 1 application topically 2 (two) times daily as needed (rash).   Yes [provider]  ondansetron (ZOFRAN-ODT) 4 MG disintegrating tablet Take 1 tablet (4 mg  total) by mouth every 8 (eight) hours as needed for nausea or vomiting. 06/28/22  Yes Blake Divine, MD  oxyCODONE-acetaminophen (PERCOCET) 10-325 MG tablet Take 1 tablet by mouth every 4 (four) hours as needed for pain.   Yes [provider]  pantoprazole (PROTONIX) 40 MG tablet Take 1 tablet (40 mg total) by mouth 2 (two) times daily. 05/02/17 08/04/28 Yes Elgergawy, Silver Huguenin, MD  Polyethyl Glycol-Propyl Glycol (SYSTANE) 0.4-0.3 % GEL ophthalmic gel Place 1 application into both eyes in the morning and at bedtime.   Yes [provider]  Potassium Chloride ER 20 MEQ TBCR Take 20 mEq by mouth daily as needed (when taking fursoemide).   Yes [provider]  pravastatin (PRAVACHOL) 40 MG tablet Take 40 mg by mouth at bedtime.   Yes [provider]  promethazine (PHENERGAN) 25 MG tablet Take 1 tablet (25 mg total) by mouth every 6 (six) hours as needed for nausea or vomiting. 09/25/22  Yes Duffy Bruce, MD  topiramate (TOPAMAX) 100 MG tablet Take 100 mg by mouth daily. 06/18/19  Yes [provider]  topiramate (TOPAMAX) 25 MG tablet Take 25 mg by mouth at bedtime.    Yes [provider]  amphetamine-dextroamphetamine (ADDERALL) 20 MG tablet Take 20 mg by mouth 3 (three) times daily.    [provider]  atropine 1 % ophthalmic solution Place 1 drop into the left eye daily as needed (for dry eye).    [provider]  benzonatate (TESSALON) 100 MG capsule Take 1 capsule (100 mg total) by mouth every 8 (eight) hours as needed for cough. Patient not taking:  Reported on 09/14/2021 06/22/21   Sherol Dade E, PA-C  Dupilumab 300 MG/2ML SOPN Inject 300 mg into the skin once a week. Friday    [provider]  fluconazole (DIFLUCAN) 150 MG tablet Take 300 mg by mouth every Monday, Wednesday, and Friday.    [provider]  LORazepam (ATIVAN) 1 MG tablet Take 1 mg by mouth 3 (three) times daily as needed for anxiety. 03/08/20   [provider]  oseltamivir (TAMIFLU) 75 MG capsule Take 1 capsule (75 mg total) by mouth every 12 (twelve) hours. Patient not taking: Reported on 12/31/2021 11/13/21   Raspet, Junie Panning K, PA-C  oxyCODONE (ROXICODONE) 5 MG immediate release tablet Take 1 tablet (5 mg total) by mouth every 8 (eight) hours as needed for breakthrough pain (in addition to your baseline medications). Patient not taking: Reported on 10/04/2022 09/25/22 09/25/23  Duffy Bruce, MD  oxycodone-acetaminophen (LYNOX) 10-300 MG tablet Take 1 tablet by mouth 3 (three) times daily. Patient not taking: Reported on 11/11/2022    [provider]  oxyCODONE-acetaminophen (PERCOCET/ROXICET) 5-325 MG tablet Take 1 tablet by mouth every 6 (six) hours as needed for moderate pain. Patient not taking: Reported on 10/04/2022 12/28/21   [provider]  prednisoLONE acetate (PRED FORTE) 1 % ophthalmic suspension Place 1 drop into the left eye as needed (conjunctivitis).    [provider]  sucralfate (CARAFATE) 1 g tablet Take 1 tablet (1 g total) by mouth 4 (four) times daily for 7 days. 09/25/22 10/04/22  Duffy Bruce, MD  SUMAtriptan (IMITREX) 25 MG tablet Take 25 mg by mouth every 2 (two) hours as needed for migraine. May repeat in 2 hours if headache persists or recurs.    [provider]  traZODone (DESYREL) 150 MG tablet Take 150 mg by mouth at bedtime as needed for sleep.    [provider]  triamcinolone lotion (KENALOG) 0.1 % Apply 1 application topically daily as needed (rash). 07/07/20   [provider]  valACYclovir (VALTREX) 1000 MG tablet Take 1 tablet (1,000 mg total) by mouth daily. Patient not taking: Reported on 10/04/2022 08/12/20   Tommy Medal, Lavell Islam, MD     Total care time: 85 minutes

## 2022-11-16 NOTE — Progress Notes (Signed)
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 tach/atrial tachycardia cardia status post loop lead replacement, obesity who presented from home on 11/11/2022 with shortness of breath, cough, wheezing, pleuritic chest pain, subjective fever and chills.  Patient was on  methotrexate, prednisone, IVIG for compromised immune system but currently on hold because of recent oral thrush.  She follows with rheumatology, immunology at [facility] .chest x-ray on presentation showed lower lung volume without infiltration.  COVID/flu negative.  CT angio negative for PE.  Patient was empirically started on broad antibiotics as well as steroids.  Hospital course remarkable for persistent productive cough.  Cultures have not for any growth.  Pulmonology also following currently she is on room air.  High-resolution CT showed moderate tracheobronchomalacia.  Antibiotics, steroids discontinued as per pulmonology. 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 tach and atrial tachycardia   Oral thrush   Obesity with body mass index (BMI) of 30.0 to 39.9  Dyspnea/tracheobronchomalacia: Patient presented with dyspnea, cough, wheezing.  Pulmonology team following. High-resolution CT showed moderate tracheobronchomalacia which explains cough and wheezing.  Pulmonology  requested to discontinue his steroids and antibiotics. Currently she is on room air. Pulmonology recommended  Symbicort (80-4.5 two puffs twice daily AND every 4 hours PRN) on discharge and  to establish care with interventional pulmonary at [facility] to look into her tracheobronchomalacia. Advised  pursed lip breathing as that helps stent open her airways. There might be a role for nocturnal CPAP for the malacia  Continue symptomatic treatment for cough.  She has productive,yellow greenish sputum Cultures have been negative.    Specific antibody deficiency/history of rheumatoid arthritis: With normal hemoglobin concentration, normal B cells.  Follows with immunology at [facility].  Taking dupixent  Hyperlipidemia: On pravastatin  History of lymphedema: Takes Lasix 60-minute twice daily at home.  Continue same  Anxiety: On Ativan as needed  Migraine: On sumatriptan as needed.  On Topamax for prophylaxis  History of eosinophilic esophagitis: On Pepcid and Carafate  History of nonsustained V. tach/atrial tachycardia: Has loop recorder on place, on metoprolol  Oral thrush: On fluconazole, nystatin  Left wrist fracture: She is following with orthopedics as an outpatient  Obesity: BMI 30.7          DVT prophylaxis:enoxaparin (LOVENOX) injection 40 mg Start: 11/11/22 0745     Code Status: Full Code  Family Communication: None at bedside  Patient status:Inpatient  Patient is from :Home  Anticipated discharge EV:OJJK  Estimated DC date:tomorrow   Consultants: Pulmonology  Procedures:None  Antimicrobials:  Anti-infectives (From admission, onward)    Start     Dose/Rate Route Frequency Ordered Stop   11/14/22 2200  amoxicillin-clavulanate (AUGMENTIN) 875-125 MG per tablet 1 tablet  Status:  Discontinued        1 tablet Oral Every 12 hours 11/14/22 1401 11/16/22 1150   11/12/22 1000  vancomycin (VANCOREADY) IVPB 1750 mg/350 mL  Status:  Discontinued  1,750 mg 175 mL/hr  over 120 Minutes Intravenous Every 24 hours 11/11/22 1433 11/13/22 1004   11/12/22 0700  cefTRIAXone (ROCEPHIN) 2 g in sodium chloride 0.9 % 100 mL IVPB  Status:  Discontinued        2 g 200 mL/hr over 30 Minutes Intravenous Every 24 hours 11/11/22 0739 11/11/22 0849   11/12/22 0700  azithromycin (ZITHROMAX) 500 mg in sodium chloride 0.9 % 250 mL IVPB  Status:  Discontinued        500 mg 250 mL/hr over 60 Minutes Intravenous Every 24 hours 11/11/22 0739 11/11/22 0849   11/11/22 1400  ceFEPIme (MAXIPIME) 2 g in sodium chloride 0.9 % 100 mL IVPB        2 g 200 mL/hr over 30 Minutes Intravenous Every 8 hours 11/11/22 0910 11/14/22 1414   11/11/22 1000  fluconazole (DIFLUCAN) tablet 300 mg        300 mg Oral Every M-W-F 11/11/22 0843     11/11/22 1000  vancomycin (VANCOREADY) IVPB 1750 mg/350 mL        1,750 mg 175 mL/hr over 120 Minutes Intravenous  Once 11/11/22 0908 11/11/22 1201   11/11/22 0700  cefTRIAXone (ROCEPHIN) 2 g in sodium chloride 0.9 % 100 mL IVPB        2 g 200 mL/hr over 30 Minutes Intravenous  Once 11/11/22 0646 11/11/22 0733   11/11/22 0700  azithromycin (ZITHROMAX) 500 mg in sodium chloride 0.9 % 250 mL IVPB        500 mg 250 mL/hr over 60 Minutes Intravenous  Once 11/11/22 0646 11/11/22 0757       Subjective: Patient seen and examined at the bedside this afternoon.  Hemodynamically stable.  She is on room air.  Complains of wheezing,productive cough.  She also complains of increasing extremity LE edema.  She does not feel ready to go home today.  Objective: Vitals:   11/16/22 0638 11/16/22 0754 11/16/22 0806 11/16/22 1155  BP: (!) 154/79 (!) 129/100  125/85  Pulse: 65 67  84  Resp: 18 19    Temp: 97.7 F (36.5 C) 98.7 F (37.1 C)    TempSrc:      SpO2: 100% 100% 97% 100%  Weight:      Height:        Intake/Output Summary (Last 24 hours) at 11/16/2022 1340 Last data filed at 11/16/2022 0446 Gross per 24 hour  Intake 5112.7 ml  Output --  Net 5112.7 ml    Filed Weights   11/11/22 0422  Weight: 81.2 kg    Examination:  General exam: Overall comfortable, not in distress HEENT: PERRL Respiratory system: Bilateral expiratory wheezing Cardiovascular system: S1 & S2 heard, RRR.  Gastrointestinal system: Abdomen is nondistended, soft and nontender. Central nervous system: Alert and oriented Extremities: Bilateral lower extremity pitting pedal edema, lymphedema, no clubbing ,no cyanosis,splint on left wrist Skin: No rashes, no ulcers,no icterus     Data Reviewed: I have personally reviewed following labs and imaging studies  CBC: Recent Labs  Lab 11/11/22 0429 11/12/22 0556 11/13/22 0519 11/15/22 0341  WBC 9.2 18.0* 15.6* 10.4  HGB 14.0 11.3* 10.5* 10.1*  HCT 41.1 33.6* 31.6* 30.3*  MCV 92.2 93.9 94.6 96.5  PLT 193 271 241 440   Basic Metabolic Panel: Recent Labs  Lab 11/11/22 0429 11/12/22 0556 11/13/22 0519 11/15/22 0341  NA 137 137 139 144  K 3.7 4.4 4.2 3.6  CL 98 106 111 114*  CO2 '30 23 23 '$ 25  GLUCOSE 116* 164* 143* 147*  BUN 10 22* 20 18  CREATININE 0.90 1.01* 0.79 0.69  CALCIUM 10.0 8.8* 8.7* 8.1*     Recent Results (from the past 240 hour(s))  Resp Panel by RT-PCR (Flu A&B, Covid) Anterior Nasal Swab     Status: None   Collection Time: 11/11/22  5:33 AM   Specimen: Anterior Nasal Swab  Result Value Ref Range Status   SARS Coronavirus 2 by RT PCR NEGATIVE NEGATIVE Final    Comment: (NOTE) SARS-CoV-2 target nucleic acids are NOT DETECTED.  The SARS-CoV-2 RNA is generally detectable in upper respiratory specimens during the acute phase of infection. The lowest concentration of SARS-CoV-2 viral copies this assay can detect is 138 copies/mL. A negative result does not preclude SARS-Cov-2 infection and should not be used as the sole basis for treatment or other patient management decisions. A negative result may occur with  improper specimen collection/handling, submission of specimen other than  nasopharyngeal swab, presence of viral mutation(s) within the areas targeted by this assay, and inadequate number of viral copies(<138 copies/mL). A negative result must be combined with clinical observations, patient history, and epidemiological information. The expected result is Negative.  Fact Sheet for Patients:  EntrepreneurPulse.com.au  Fact Sheet for Healthcare Providers:  IncredibleEmployment.be  This test is no t yet approved or cleared by the [place] FDA and  has been authorized for detection and/or diagnosis of SARS-CoV-2 by FDA under an Emergency Use Authorization (EUA). This EUA will remain  in effect (meaning this test can be used) for the duration of the COVID-19 declaration under Section 564(b)(1) of the Act, 21 U.S.C.section 360bbb-3(b)(1), unless the authorization is terminated  or revoked sooner.       Influenza A by PCR NEGATIVE NEGATIVE Final   Influenza B by PCR NEGATIVE NEGATIVE Final    Comment: (NOTE) The Xpert Xpress SARS-CoV-2/FLU/RSV plus assay is intended as an aid in the diagnosis of influenza from Nasopharyngeal swab specimens and should not be used as a sole basis for treatment. Nasal washings and aspirates are unacceptable for Xpert Xpress SARS-CoV-2/FLU/RSV testing.  Fact Sheet for Patients: EntrepreneurPulse.com.au  Fact Sheet for Healthcare Providers: IncredibleEmployment.be  This test is not yet approved or cleared by the [place] FDA and has been authorized for detection and/or diagnosis of SARS-CoV-2 by FDA under an Emergency Use Authorization (EUA). This EUA will remain in effect (meaning this test can be used) for the duration of the COVID-19 declaration under Section 564(b)(1) of the Act, 21 U.S.C. section 360bbb-3(b)(1), unless the authorization is terminated or revoked.  Performed at [facility], [place], [place]  [address]   Respiratory (~20 pathogens) panel by PCR     Status: None   Collection Time: 11/11/22  5:33 AM   Specimen: Anterior Nasal Swab; Respiratory  Result Value Ref Range Status   Adenovirus NOT DETECTED NOT DETECTED Final   Coronavirus 229E NOT DETECTED NOT DETECTED Final    Comment: (NOTE) The Coronavirus on the Respiratory Panel, DOES NOT test for the novel  Coronavirus (2019 nCoV)    Coronavirus HKU1 NOT DETECTED NOT DETECTED Final   Coronavirus NL63 NOT DETECTED NOT DETECTED Final   Coronavirus OC43 NOT DETECTED NOT DETECTED Final   Metapneumovirus NOT DETECTED NOT DETECTED Final   Rhinovirus / Enterovirus NOT DETECTED NOT DETECTED Final   Influenza A NOT DETECTED NOT DETECTED Final   Influenza B NOT DETECTED NOT DETECTED Final   Parainfluenza Virus 1 NOT DETECTED NOT  DETECTED Final   Parainfluenza Virus 2 NOT DETECTED NOT DETECTED Final   Parainfluenza Virus 3 NOT DETECTED NOT DETECTED Final   Parainfluenza Virus 4 NOT DETECTED NOT DETECTED Final   Respiratory Syncytial Virus NOT DETECTED NOT DETECTED Final   Bordetella pertussis NOT DETECTED NOT DETECTED Final   Bordetella Parapertussis NOT DETECTED NOT DETECTED Final   Chlamydophila pneumoniae NOT DETECTED NOT DETECTED Final   Mycoplasma pneumoniae NOT DETECTED NOT DETECTED Final    Comment: Performed at [facility], [place] [address] [place], [place] [address]  Expectorated Sputum Assessment w Gram Stain, Rflx to Resp Cult     Status: None   Collection Time: 11/11/22  6:58 AM   Specimen: Sputum  Result Value Ref Range Status   Specimen Description SPUTUM  Final   Special Requests NONE  Final   Sputum evaluation   Final    THIS SPECIMEN IS ACCEPTABLE FOR SPUTUM CULTURE Performed at [facility], [address], [place], [place] [address]    Report Status 11/11/2022 FINAL  Final  Culture, Respiratory w Gram Stain     Status: None   Collection Time: 11/11/22  6:58 AM   Specimen: SPU  Result Value Ref  Range Status   Specimen Description   Final    SPUTUM Performed at [facility], [address], [place], [place] [address]    Special Requests   Final    NONE Reflexed from [id number] Performed at [facility], [address] [place], [place] [address]    Gram Stain NO WBC SEEN FEW GRAM POSITIVE COCCI IN PAIRS   Final   Culture   Final    RARE STAPHYLOCOCCUS AUREUS WITHIN MIXED ORGANISMS Performed at [facility], [place] [address], [place], [place] [address]    Report Status 11/14/2022 FINAL  Final   Organism ID, Bacteria STAPHYLOCOCCUS AUREUS  Final      Susceptibility   Staphylococcus aureus - MIC*    CIPROFLOXACIN <=0.5 SENSITIVE Sensitive     ERYTHROMYCIN <=0.25 SENSITIVE Sensitive     GENTAMICIN <=0.5 SENSITIVE Sensitive     OXACILLIN 0.5 SENSITIVE Sensitive     TETRACYCLINE <=1 SENSITIVE Sensitive     VANCOMYCIN <=0.5 SENSITIVE Sensitive     TRIMETH/SULFA <=10 SENSITIVE Sensitive     CLINDAMYCIN <=0.25 SENSITIVE Sensitive     RIFAMPIN <=0.5 SENSITIVE Sensitive     Inducible Clindamycin NEGATIVE Sensitive     * RARE STAPHYLOCOCCUS AUREUS  Culture, blood (x 2)     Status: None   Collection Time: 11/11/22  8:09 AM   Specimen: BLOOD RIGHT ARM  Result Value Ref Range Status   Specimen Description BLOOD RIGHT ARM  Final   Special Requests   Final    BOTTLES DRAWN AEROBIC AND ANAEROBIC Blood Culture adequate volume   Culture   Final    NO GROWTH 5 DAYS Performed at [facility], [address] [place], [place] [address]    Report Status 11/16/2022 FINAL  Final  Culture, blood (x 2)     Status: None   Collection Time: 11/11/22  9:15 AM   Specimen: BLOOD RIGHT FOREARM  Result Value Ref Range Status   Specimen Description BLOOD RIGHT FOREARM  Final   Special Requests   Final    BOTTLES DRAWN AEROBIC AND ANAEROBIC Blood Culture adequate volume   Culture   Final    NO GROWTH 5 DAYS Performed at [facility], [place],  [place], [place] [address]    Report Status 11/16/2022 FINAL  Final  MRSA Next Gen by PCR, Nasal     Status: None   Collection Time: 11/12/22  1:46 PM   Specimen: Nasal Mucosa; Nasal Swab  Result Value Ref Range Status   MRSA by PCR Next Gen NOT DETECTED NOT DETECTED Final    Comment: (NOTE) The GeneXpert MRSA Assay (FDA approved for NASAL specimens only), is one component of a comprehensive MRSA colonization surveillance program. It is not intended to diagnose MRSA infection nor to guide or monitor treatment for MRSA infections. Test performance is not FDA approved in patients less than 90 years old. Performed at [facility], [address] [place], [place] [address]      Radiology Studies: CT Chest High Resolution  Result Date: 11/16/2022 CLINICAL DATA:  Rule out tracheobronchomalacia EXAM: CT CHEST WITHOUT CONTRAST TECHNIQUE: Multidetector CT imaging of the chest was performed following the standard protocol without intravenous contrast. High resolution imaging of the lungs, as well as inspiratory and expiratory imaging, was performed. RADIATION DOSE REDUCTION: This exam was performed according to the departmental dose-optimization program which includes automated exposure control, adjustment of the mA and/or kV according to patient size and/or use of iterative reconstruction technique. COMPARISON:  None Available. FINDINGS: Cardiovascular: No significant vascular findings. Normal heart size. No pericardial effusion. Mediastinum/Nodes: No enlarged mediastinal, hilar, or axillary lymph nodes. Thyroid gland and esophagus demonstrate no significant findings. Lungs/Pleura: Minimal dependent bibasilar scarring and or partial atelectasis. No significant air trapping on expiratory phase imaging. Moderate tracheobronchomalacia (series 9, image 109, 127). No pleural effusion or pneumothorax. Upper Abdomen: No acute abnormality. Hepatic steatosis. Status post cholecystectomy  Musculoskeletal: No chest wall abnormality. Implantable loop recorder. No acute osseous findings. IMPRESSION: 1. Moderate tracheobronchomalacia on expiratory phase imaging. 2. No significant air trapping. 3. No acute abnormality of the lungs. 4. Hepatic steatosis. Electronically Signed   By: Delanna Ahmadi M.D.   On: 11/16/2022 13:18    Scheduled Meds:  baclofen  10 mg Oral QHS   cycloSPORINE  1 drop Both Eyes BID   enoxaparin (LOVENOX) injection  40 mg Subcutaneous Q24H   famotidine  40 mg Oral QHS   fluconazole  300 mg Oral Q M,W,F   folic acid  2 mg Oral Daily   furosemide  60 mg Oral BID   ipratropium-albuterol  3 mL Nebulization BID   metoCLOPramide  10 mg Oral BID   metoprolol succinate  50 mg Oral Daily   nystatin  10 mL Oral TID   olopatadine  1 drop Both Eyes BID   pantoprazole  40 mg Oral BID   polyvinyl alcohol  1 drop Both Eyes BID   pravastatin  40 mg Oral QHS   sucralfate  1 g Oral QID   topiramate  100 mg Oral Daily   topiramate  25 mg Oral QHS   Continuous Infusions:  sodium chloride Stopped (11/12/22 0717)   sodium chloride 75 mL/hr at 11/16/22 0554     LOS: 4 days   Shelly Coss, MD [facility] P11/22/2023, 1:40 PM

## 2022-11-17 DIAGNOSIS — J398 Other specified diseases of upper respiratory tract: Secondary | ICD-10-CM | POA: Diagnosis not present

## 2022-11-17 LAB — BASIC METABOLIC PANEL
Anion gap: 6 (ref 5–15)
BUN: 15 mg/dL (ref 6–20)
CO2: 27 mmol/L (ref 22–32)
Calcium: 8.4 mg/dL — ABNORMAL LOW (ref 8.9–10.3)
Chloride: 108 mmol/L (ref 98–111)
Creatinine, Ser: 0.82 mg/dL (ref 0.44–1.00)
GFR, Estimated: >60 mL/min (ref >60)
Glucose, Bld: 110 mg/dL — ABNORMAL HIGH (ref 70–99)
Potassium: 2.8 mmol/L — ABNORMAL LOW (ref 3.5–5.1)
Sodium: 141 mmol/L (ref 135–145)

## 2022-11-17 MED ORDER — LOPERAMIDE HCL 2 MG PO CAPS
2.0000 mg | ORAL_CAPSULE | ORAL | Status: DC | PRN
  Administered 2022-11-17 – 2022-11-18 (×3): 2 mg via ORAL
  Filled 2022-11-17 (×3): qty 1

## 2022-11-17 MED ORDER — BUDESONIDE-FORMOTEROL FUMARATE 80-4.5 MCG/ACT IN AERO: 2.0000 | INHALATION_SPRAY | Freq: Two times a day (BID) | RESPIRATORY_TRACT | 1 refills | Status: DC

## 2022-11-17 MED ORDER — FUROSEMIDE 40 MG PO TABS
40.0000 mg | ORAL_TABLET | Freq: Two times a day (BID) | ORAL | Status: DC
  Administered 2022-11-17 – 2022-11-18 (×3): 40 mg via ORAL
  Filled 2022-11-17 (×3): qty 1

## 2022-11-17 MED ORDER — POTASSIUM CHLORIDE 10 MEQ/100ML IV SOLN
10.0000 meq | INTRAVENOUS | Status: AC
  Administered 2022-11-17 (×5): 10 meq via INTRAVENOUS
  Filled 2022-11-17 (×3): qty 100

## 2022-11-17 MED ORDER — MOMETASONE FURO-FORMOTEROL FUM 200-5 MCG/ACT IN AERO
2.0000 | INHALATION_SPRAY | Freq: Two times a day (BID) | RESPIRATORY_TRACT | Status: DC
  Administered 2022-11-17 – 2022-11-20 (×7): 2 via RESPIRATORY_TRACT
  Filled 2022-11-17: qty 8.8

## 2022-11-17 MED ORDER — POTASSIUM CHLORIDE CRYS ER 20 MEQ PO TBCR
40.0000 meq | EXTENDED_RELEASE_TABLET | Freq: Every day | ORAL | Status: DC
  Administered 2022-11-18 – 2022-11-20 (×3): 40 meq via ORAL
  Filled 2022-11-17 (×3): qty 2

## 2022-11-17 MED ORDER — POTASSIUM CHLORIDE CRYS ER 20 MEQ PO TBCR
40.0000 meq | EXTENDED_RELEASE_TABLET | Freq: Once | ORAL | Status: AC
  Administered 2022-11-17: 40 meq via ORAL
  Filled 2022-11-17: qty 2

## 2022-11-17 NOTE — Progress Notes (Signed)
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 tach/atrial tachycardia cardia status post loop lead replacement, obesity who presented from home on 11/11/2022 with shortness of breath, cough, wheezing, pleuritic chest pain, subjective fever and chills.  Patient was on  methotrexate, prednisone, IVIG for compromised immune system but currently on hold because of recent oral thrush.  She follows with rheumatology, immunology at [facility] .chest x-ray on presentation showed lower lung volume without infiltration.  COVID/flu negative.  CT angio negative for PE.  Patient was empirically started on broad antibiotics as well as steroids.  Hospital course remarkable for persistent productive cough,wheezing.  Cultures have not for any growth.  High-resolution CT showed moderate tracheobronchomalacia.  Antibiotics, steroids discontinued as per pulmonology. 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 tach and atrial tachycardia   Oral thrush   Obesity with body mass index (BMI) of 30.0 to 39.9  Dyspnea/tracheobronchomalacia: Patient presented with dyspnea, cough, wheezing.  Pulmonology team were following. High-resolution CT showed moderate tracheobronchomalacia which explains cough and  wheezing.  Pulmonology requested to discontinue his steroids and antibiotics. Currently she is on room air. Pulmonology recommended  Symbicort (80-4.5 two puffs twice daily AND every 4 hours PRN) on discharge and  to establish care with interventional pulmonary at [facility] to look into her tracheobronchomalacia. Advised  pursed lip breathing as that helps stent open her airways.  Continue symptomatic treatment for cough.  She has productive yellow sputum. Cultures have been negative.   Prescription for Symbicort sent to her pharmacy, and the meantime continue to [facility]  here  Hypokalemia: Continue aggressive supplementation for today.  Check BMP tomorrow  Specific antibody deficiency/history of rheumatoid arthritis: With normal immunoglobulin concentration, normal B cells.  Follows with immunology at [facility].  Taking dupixent  Hyperlipidemia: On pravastatin  History of lymphedema: Takes Lasix 60-minute twice daily at home.  Continue 40 mg bid for now.  Continue potassium supplementation as well  Anxiety: On Ativan as needed  Migraine: On sumatriptan as needed.  On Topamax for prophylaxis  History of eosinophilic esophagitis: On Pepcid and Carafate  History of nonsustained V. tach/atrial tachycardia: Has loop recorder on place, on metoprolol  Oral thrush: On fluconazole, nystatin  Left wrist fracture: She is following with orthopedics as an outpatient  Obesity: BMI 30.7          DVT prophylaxis:enoxaparin (LOVENOX) injection 40 mg Start: 11/11/22 0745     Code Status: Full Code  Family Communication: None at bedside  Patient status:Inpatient  Patient is from :Home  Anticipated discharge SN:[facility]  Estimated DC date:tomorrow   Consultants: Pulmonology  Procedures:None  Antimicrobials:  Anti-infectives (From admission, onward)    Start     Dose/Rate Route Frequency Ordered Stop   11/14/22 2200  amoxicillin-clavulanate (AUGMENTIN) 875-125 MG per tablet 1 tablet  Status:   Discontinued  1 tablet Oral Every 12 hours 11/14/22 1401 11/16/22 1150   11/12/22 1000  vancomycin (VANCOREADY) IVPB 1750 mg/350 mL  Status:  Discontinued        1,750 mg 175 mL/hr over 120 Minutes Intravenous Every 24 hours 11/11/22 1433 11/13/22 1004   11/12/22 0700  cefTRIAXone (ROCEPHIN) 2 g in sodium chloride 0.9 % 100 mL IVPB  Status:  Discontinued        2 g 200 mL/hr over 30 Minutes Intravenous Every 24 hours 11/11/22 0739 11/11/22 0849   11/12/22 0700  azithromycin (ZITHROMAX) 500 mg in sodium chloride 0.9 % 250 mL IVPB  Status:  Discontinued        500 mg 250 mL/hr over 60 Minutes Intravenous Every 24 hours 11/11/22 0739 11/11/22 0849   11/11/22 1400  ceFEPIme (MAXIPIME) 2 g in sodium chloride 0.9 % 100 mL IVPB        2 g 200 mL/hr over 30 Minutes Intravenous Every 8 hours 11/11/22 0910 11/14/22 1414   11/11/22 1000  fluconazole (DIFLUCAN) tablet 300 mg        300 mg Oral Every M-W-F 11/11/22 0843     11/11/22 1000  vancomycin (VANCOREADY) IVPB 1750 mg/350 mL        1,750 mg 175 mL/hr over 120 Minutes Intravenous  Once 11/11/22 0908 11/11/22 1201   11/11/22 0700  cefTRIAXone (ROCEPHIN) 2 g in sodium chloride 0.9 % 100 mL IVPB        2 g 200 mL/hr over 30 Minutes Intravenous  Once 11/11/22 0646 11/11/22 0733   11/11/22 0700  azithromycin (ZITHROMAX) 500 mg in sodium chloride 0.9 % 250 mL IVPB        500 mg 250 mL/hr over 60 Minutes Intravenous  Once 11/11/22 0646 11/11/22 0757       Subjective: Patient seen and examined at bedside today.  Remains on room air.  Hemodynamically stable.  Continues to cough and brings up sputum.  Auscultation revealed bilateral expiratory wheezing.  She does not feel comfortable to go home today.  She is anticipating that she might be able to go tomorrow.  Objective: Vitals:   11/16/22 2222 11/17/22 0523 11/17/22 0730 11/17/22 0753  BP: 134/86 118/81 (!) 130/99   Pulse: 78 77 71   Resp: '20 16 17   '$ Temp: 98 F (36.7 C) 98 F (36.7 C)  97.9 F (36.6 C)   TempSrc: Oral Oral    SpO2: 98% 100% 95% 94%  Weight:      Height:        Intake/Output Summary (Last 24 hours) at 11/17/2022 1054 Last data filed at 11/17/2022 1018 Gross per 24 hour  Intake 1524.54 ml  Output --  Net 1524.54 ml   Filed Weights   11/11/22 0422  Weight: 81.2 kg    Examination:   General exam: Overall comfortable, not in distress,weak appearing HEENT: PERRL Respiratory system: Bilateral expiratory wheezing Cardiovascular system: S1 & S2 heard, RRR.  Gastrointestinal system: Abdomen is nondistended, soft and nontender. Central nervous system: Alert and oriented Extremities: Bilateral lower extremity pitting pedal edema, no clubbing ,no cyanosis, splint on the left wrist Skin: No rashes, no ulcers,no icterus     Data Reviewed: I have personally reviewed following labs and imaging studies  CBC: Recent Labs  Lab 11/11/22 0429 11/12/22 0556 11/13/22 0519 11/15/22 0341  WBC 9.2 18.0* 15.6* 10.4  HGB 14.0 11.3* 10.5* 10.1*  HCT 41.1 33.6* 31.6* 30.3*  MCV 92.2 93.9 94.6 96.5  PLT  193 271 241 073   Basic Metabolic Panel: Recent Labs  Lab 11/11/22 0429 11/12/22 0556 11/13/22 0519 11/15/22 0341 11/17/22 0557  NA 137 137 139 144 141  K 3.7 4.4 4.2 3.6 2.8*  CL 98 106 111 114* 108  CO2 '30 23 23 25 27  '$ GLUCOSE 116* 164* 143* 147* 110*  BUN 10 22* '20 18 15  '$ CREATININE 0.90 1.01* 0.79 0.69 0.82  CALCIUM 10.0 8.8* 8.7* 8.1* 8.4*     Recent Results (from the past 240 hour(s))  Resp Panel by RT-PCR (Flu A&B, Covid) Anterior Nasal Swab     Status: None   Collection Time: 11/11/22  5:33 AM   Specimen: Anterior Nasal Swab  Result Value Ref Range Status   SARS Coronavirus 2 by RT PCR NEGATIVE NEGATIVE Final    Comment: (NOTE) SARS-CoV-2 target nucleic acids are NOT DETECTED.  The SARS-CoV-2 RNA is generally detectable in upper respiratory specimens during the acute phase of infection. The lowest concentration of SARS-CoV-2  viral copies this assay can detect is 138 copies/mL. A negative result does not preclude SARS-Cov-2 infection and should not be used as the sole basis for treatment or other patient management decisions. A negative result may occur with  improper specimen collection/handling, submission of specimen other than nasopharyngeal swab, presence of viral mutation(s) within the areas targeted by this assay, and inadequate number of viral copies(<138 copies/mL). A negative result must be combined with clinical observations, patient history, and epidemiological information. The expected result is Negative.  Fact Sheet for Patients:  EntrepreneurPulse.com.au  Fact Sheet for Healthcare Providers:  IncredibleEmployment.be  This test is no t yet approved or cleared by the [place] FDA and  has been authorized for detection and/or diagnosis of SARS-CoV-2 by FDA under an Emergency Use Authorization (EUA). This EUA will remain  in effect (meaning this test can be used) for the duration of the COVID-19 declaration under Section 564(b)(1) of the Act, 21 U.S.C.section 360bbb-3(b)(1), unless the authorization is terminated  or revoked sooner.       Influenza A by PCR NEGATIVE NEGATIVE Final   Influenza B by PCR NEGATIVE NEGATIVE Final    Comment: (NOTE) The Xpert Xpress SARS-CoV-2/FLU/RSV plus assay is intended as an aid in the diagnosis of influenza from Nasopharyngeal swab specimens and should not be used as a sole basis for treatment. Nasal washings and aspirates are unacceptable for Xpert Xpress SARS-CoV-2/FLU/RSV testing.  Fact Sheet for Patients: EntrepreneurPulse.com.au  Fact Sheet for Healthcare Providers: IncredibleEmployment.be  This test is not yet approved or cleared by the [place] FDA and has been authorized for detection and/or diagnosis of SARS-CoV-2 by FDA under an Emergency Use Authorization  (EUA). This EUA will remain in effect (meaning this test can be used) for the duration of the COVID-19 declaration under Section 564(b)(1) of the Act, 21 U.S.C. section 360bbb-3(b)(1), unless the authorization is terminated or revoked.  Performed at [facility], [place], [place] [address]   Respiratory (~20 pathogens) panel by PCR     Status: None   Collection Time: 11/11/22  5:33 AM   Specimen: Anterior Nasal Swab; Respiratory  Result Value Ref Range Status   Adenovirus NOT DETECTED NOT DETECTED Final   Coronavirus 229E NOT DETECTED NOT DETECTED Final    Comment: (NOTE) The Coronavirus on the Respiratory Panel, DOES NOT test for the novel  Coronavirus (2019 nCoV)    Coronavirus HKU1 NOT DETECTED NOT DETECTED Final   Coronavirus NL63 NOT DETECTED  NOT DETECTED Final   Coronavirus OC43 NOT DETECTED NOT DETECTED Final   Metapneumovirus NOT DETECTED NOT DETECTED Final   Rhinovirus / Enterovirus NOT DETECTED NOT DETECTED Final   Influenza A NOT DETECTED NOT DETECTED Final   Influenza B NOT DETECTED NOT DETECTED Final   Parainfluenza Virus 1 NOT DETECTED NOT DETECTED Final   Parainfluenza Virus 2 NOT DETECTED NOT DETECTED Final   Parainfluenza Virus 3 NOT DETECTED NOT DETECTED Final   Parainfluenza Virus 4 NOT DETECTED NOT DETECTED Final   Respiratory Syncytial Virus NOT DETECTED NOT DETECTED Final   Bordetella pertussis NOT DETECTED NOT DETECTED Final   Bordetella Parapertussis NOT DETECTED NOT DETECTED Final   Chlamydophila pneumoniae NOT DETECTED NOT DETECTED Final   Mycoplasma pneumoniae NOT DETECTED NOT DETECTED Final    Comment: Performed at [facility], [place] [address], [place], [place] [address]  Expectorated Sputum Assessment w Gram Stain, Rflx to Resp Cult     Status: None   Collection Time: 11/11/22  6:58 AM   Specimen: Sputum  Result Value Ref Range Status   Specimen Description SPUTUM  Final   Special Requests NONE  Final   Sputum  evaluation   Final    THIS SPECIMEN IS ACCEPTABLE FOR SPUTUM CULTURE Performed at [facility], [address] [place], [place] [address]    Report Status 11/11/2022 FINAL  Final  Culture, Respiratory w Gram Stain     Status: None   Collection Time: 11/11/22  6:58 AM   Specimen: SPU  Result Value Ref Range Status   Specimen Description   Final    SPUTUM Performed at [facility], [address], [place], [place] [address]    Special Requests   Final    NONE Reflexed from [id number] Performed at [facility] [place] [facility], [address] [place], [place] [address]    Gram Stain NO WBC SEEN FEW GRAM POSITIVE COCCI IN PAIRS   Final   Culture   Final    RARE STAPHYLOCOCCUS AUREUS WITHIN MIXED ORGANISMS Performed at [facility], [place] [address], [place], [place] [address]    Report Status 11/14/2022 FINAL  Final   Organism ID, Bacteria STAPHYLOCOCCUS AUREUS  Final      Susceptibility   Staphylococcus aureus - MIC*    CIPROFLOXACIN <=0.5 SENSITIVE Sensitive     ERYTHROMYCIN <=0.25 SENSITIVE Sensitive     GENTAMICIN <=0.5 SENSITIVE Sensitive     OXACILLIN 0.5 SENSITIVE Sensitive     TETRACYCLINE <=1 SENSITIVE Sensitive     VANCOMYCIN <=0.5 SENSITIVE Sensitive     TRIMETH/SULFA <=10 SENSITIVE Sensitive     CLINDAMYCIN <=0.25 SENSITIVE Sensitive     RIFAMPIN <=0.5 SENSITIVE Sensitive     Inducible Clindamycin NEGATIVE Sensitive     * RARE STAPHYLOCOCCUS AUREUS  Culture, blood (x 2)     Status: None   Collection Time: 11/11/22  8:09 AM   Specimen: BLOOD RIGHT ARM  Result Value Ref Range Status   Specimen Description BLOOD RIGHT ARM  Final   Special Requests   Final    BOTTLES DRAWN AEROBIC AND ANAEROBIC Blood Culture adequate volume   Culture   Final    NO GROWTH 5 DAYS Performed at [facility], [address], [place], [place] [address]    Report Status 11/16/2022 FINAL  Final  Culture, blood (x 2)     Status: None   Collection Time: 11/11/22   9:15 AM   Specimen: BLOOD RIGHT FOREARM  Result Value Ref Range Status   Specimen Description BLOOD RIGHT FOREARM  Final   Special Requests   Final    BOTTLES DRAWN AEROBIC AND ANAEROBIC Blood Culture adequate volume   Culture   Final    NO GROWTH 5 DAYS Performed at [facility], [address], [place], [place] [address]    Report Status 11/16/2022 FINAL  Final  MRSA Next Gen by PCR, Nasal     Status: None   Collection Time: 11/12/22  1:46 PM   Specimen: Nasal Mucosa; Nasal Swab  Result Value Ref Range Status   MRSA by PCR Next Gen NOT DETECTED NOT DETECTED Final    Comment: (NOTE) The GeneXpert MRSA Assay (FDA approved for NASAL specimens only), is one component of a comprehensive MRSA colonization surveillance program. It is not intended to diagnose MRSA infection nor to guide or monitor treatment for MRSA infections. Test performance is not FDA approved in patients less than 54 years old. Performed at [facility], [address], [place], [place] [address]      Radiology Studies: CT Chest High Resolution  Result Date: 11/16/2022 CLINICAL DATA:  Rule out tracheobronchomalacia EXAM: CT CHEST WITHOUT CONTRAST TECHNIQUE: Multidetector CT imaging of the chest was performed following the standard protocol without intravenous contrast. High resolution imaging of the lungs, as well as inspiratory and expiratory imaging, was performed. RADIATION DOSE REDUCTION: This exam was performed according to the departmental dose-optimization program which includes automated exposure control, adjustment of the mA and/or kV according to patient size and/or use of iterative reconstruction technique. COMPARISON:  None Available. FINDINGS: Cardiovascular: No significant vascular findings. Normal heart size. No pericardial effusion. Mediastinum/Nodes: No enlarged mediastinal, hilar, or axillary lymph nodes. Thyroid gland and esophagus demonstrate no significant findings.  Lungs/Pleura: Minimal dependent bibasilar scarring and or partial atelectasis. No significant air trapping on expiratory phase imaging. Moderate tracheobronchomalacia (series 9, image 109, 127). No pleural effusion or pneumothorax. Upper Abdomen: No acute abnormality. Hepatic steatosis. Status post cholecystectomy Musculoskeletal: No chest wall abnormality. Implantable loop recorder. No acute osseous findings. IMPRESSION: 1. Moderate tracheobronchomalacia on expiratory phase imaging. 2. No significant air trapping. 3. No acute abnormality of the lungs. 4. Hepatic steatosis. Electronically Signed   By: Delanna Ahmadi M.D.   On: 11/16/2022 13:18    Scheduled Meds:  baclofen  10 mg Oral QHS   cycloSPORINE  1 drop Both Eyes BID   enoxaparin (LOVENOX) injection  40 mg Subcutaneous Q24H   famotidine  40 mg Oral QHS   fluconazole  300 mg Oral Q M,W,F   folic acid  2 mg Oral Daily   furosemide  40 mg Oral BID   ipratropium-albuterol  3 mL Nebulization BID   metoCLOPramide  10 mg Oral BID   metoprolol succinate  50 mg Oral Daily   mometasone-formoterol  2 puff Inhalation BID   nystatin  10 mL Oral TID   olopatadine  1 drop Both Eyes BID   pantoprazole  40 mg Oral BID   polyvinyl alcohol  1 drop Both Eyes BID   pravastatin  40 mg Oral QHS   sucralfate  1 g Oral QID   topiramate  100 mg Oral Daily   topiramate  25 mg Oral QHS   Continuous Infusions:  sodium chloride Stopped (11/12/22 0717)   potassium chloride 10 mEq (11/17/22 0945)     LOS: 5 days   Shelly Coss, MD [facility] P11/23/2023, 10:54 AM

## 2022-11-17 NOTE — Plan of Care (Signed)

## 2022-11-18 DIAGNOSIS — J398 Other specified diseases of upper respiratory tract: Secondary | ICD-10-CM | POA: Diagnosis not present

## 2022-11-18 LAB — BASIC METABOLIC PANEL
Anion gap: 8 (ref 5–15)
BUN: 12 mg/dL (ref 6–20)
CO2: 29 mmol/L (ref 22–32)
Calcium: 8.8 mg/dL — ABNORMAL LOW (ref 8.9–10.3)
Chloride: 101 mmol/L (ref 98–111)
Creatinine, Ser: 0.76 mg/dL (ref 0.44–1.00)
GFR, Estimated: >60 mL/min (ref >60)
Glucose, Bld: 114 mg/dL — ABNORMAL HIGH (ref 70–99)
Potassium: 3.7 mmol/L (ref 3.5–5.1)
Sodium: 138 mmol/L (ref 135–145)

## 2022-11-18 MED ORDER — FUROSEMIDE 40 MG PO TABS
60.0000 mg | ORAL_TABLET | Freq: Two times a day (BID) | ORAL | Status: DC
  Administered 2022-11-18 – 2022-11-20 (×3): 60 mg via ORAL
  Filled 2022-11-18 (×4): qty 1

## 2022-11-18 NOTE — Progress Notes (Signed)
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 tach/atrial tachycardia cardia status post loop lead replacement, obesity who presented from home on 11/11/2022 with shortness of breath, cough, wheezing, pleuritic chest pain, subjective fever and chills.  Patient was on  methotrexate, prednisone, IVIG for compromised immune system but currently on hold because of recent oral thrush.  She follows with rheumatology, immunology at [facility] .chest x-ray on presentation showed lower lung volume without infiltration.  COVID/flu negative.  CT angio negative for PE.  Patient was empirically started on broad antibiotics as well as steroids.  Hospital course remarkable for persistent productive cough,wheezing.  Cultures have not for any growth.  HRCT- showed moderate tracheobronchomalacia.  Antibiotics, steroids discontinued as per pulmonology.  Subjective  Seen and examined this morning.   Overnight afebrile BP stable in 90s to 115, saturating well on room air, labs with improved potassium. 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 tach and atrial tachycardia   Oral thrush   Obesity with body mass index (BMI) of 30.0 to 39.9  Tracheomalacia with dyspnea/wheezing cough: Initially presented with dyspnea wheezing cough seen by  pulmonary and underwent extensive work-up with HRCT:showed moderate tracheobronchomalacia-felt to be the cause of her presentation, subsequently steroids antibiotics were discontinued per pulmonary, cultures unremarkable.  Currently doing well on room air, PER PULM RECS:cont Symbicort (80-4.5 two puffs twice daily AND every 4 hours PRN) on discharge and  to establish care with interventional pulmonary at [facility] to look into her tracheobronchomalacia,advised  pursed lip breathing as that helps stent open her airways. Cont antitussives prn. Prescription for Symbicort was sent to her pharmacy,> remains on Dulera in house.   Hypokalemia: Resolved Recent Labs  Lab 11/12/22 0556 11/13/22 0519 11/15/22 0341 11/17/22 0557 11/18/22 0654  K 4.4 4.2 3.6 2.8* 3.7   Specific antibody deficiency/history of rheumatoid arthritis: With normal immunoglobulin concentration, normal B cells.  Followed by immunology at [facility], taking dupixent  HLD: Cont pravastatin  History of lymphedema Complains of weight gain/worsening leg edema: Normal 160 twice daily at home currently on 40 twice daily increased to home dose.  Followed by lymphedema clinic. Net IO Since Admission: 8,651.01 mL [11/18/22 1104] we will ask RN to check weight-normally sees around 181 pounds per her.   Anxiety disorder: On Ativan as needed Migraine on sumatriptan prn and Topamax History of eosinophilic esophagitis: Continue Pepcid and Carafate History of nonsustained V. tach/atrial tachycardia: Has loop recorder on place, continue on metoprolol Oral thrush: On fluconazole, nystatin Left wrist fracture: She is following with orthopedics as an outpatient Class I obesity with BMI more than 30 continue current weight loss  DVT prophylaxis:enoxaparin (LOVENOX) injection 40 mg Start: 11/11/22 0745   Code Status: Full Code Family Communication: None at bedside  Patient status:Inpatient Patient is from :Home Anticipated discharge  PZ:[facility] Estimated DC date:tomorrow  Consultants: Pulmonology  Procedures:None  Antimicrobials:  Anti-infectives (From admission, onward)  Start     Dose/Rate Route Frequency Ordered Stop   11/14/22 2200  amoxicillin-clavulanate (AUGMENTIN) 875-125 MG per tablet 1 tablet  Status:  Discontinued        1 tablet Oral Every 12 hours 11/14/22 1401 11/16/22 1150   11/12/22 1000  vancomycin (VANCOREADY) IVPB 1750 mg/350 mL  Status:  Discontinued        1,750 mg 175 mL/hr over 120 Minutes Intravenous Every 24 hours 11/11/22 1433 11/13/22 1004   11/12/22 0700  cefTRIAXone (ROCEPHIN) 2 g in sodium chloride 0.9 % 100 mL IVPB  Status:  Discontinued        2 g 200 mL/hr over 30 Minutes Intravenous Every 24 hours 11/11/22 0739 11/11/22 0849   11/12/22 0700  azithromycin (ZITHROMAX) 500 mg in sodium chloride 0.9 % 250 mL IVPB  Status:  Discontinued        500 mg 250 mL/hr over 60 Minutes Intravenous Every 24 hours 11/11/22 0739 11/11/22 0849   11/11/22 1400  ceFEPIme (MAXIPIME) 2 g in sodium chloride 0.9 % 100 mL IVPB        2 g 200 mL/hr over 30 Minutes Intravenous Every 8 hours 11/11/22 0910 11/14/22 1414   11/11/22 1000  fluconazole (DIFLUCAN) tablet 300 mg        300 mg Oral Every M-W-F 11/11/22 0843     11/11/22 1000  vancomycin (VANCOREADY) IVPB 1750 mg/350 mL        1,750 mg 175 mL/hr over 120 Minutes Intravenous  Once 11/11/22 0908 11/11/22 1201   11/11/22 0700  cefTRIAXone (ROCEPHIN) 2 g in sodium chloride 0.9 % 100 mL IVPB        2 g 200 mL/hr over 30 Minutes Intravenous  Once 11/11/22 0646 11/11/22 0733   11/11/22 0700  azithromycin (ZITHROMAX) 500 mg in sodium chloride 0.9 % 250 mL IVPB        500 mg 250 mL/hr over 60 Minutes Intravenous  Once 11/11/22 0646 11/11/22 0757      Objective: Vitals:   11/17/22 2034 11/18/22 0607 11/18/22 0658 11/18/22 0740  BP: 99/87 108/75  115/70  Pulse: 80 76  87  Resp: '18 20  16  '$ Temp: 97.8 F (36.6 C) 98.4 F (36.9 C)  98.6 F (37 C)   TempSrc: Oral     SpO2: 100% 100% 100% 96%  Weight:      Height:       Examination: General exam: AAOX3, weak,older appearing HEENT:Oral mucosa moist, Ear/Nose WNL grossly, dentition normal. Respiratory system: bilaterally expiratory wheezing, NO use of accessory muscle Cardiovascular system: S1 & S2 +, regular rate, JVD NEG. Gastrointestinal system: Abdomen soft, NT,ND,BS+ Nervous System:Alert, awake, moving extremities and grossly nonfocal Extremities: Lymphedema in lower extremities , lower extremities warm Skin: No rashes,no icterus. MSK: Normal muscle bulk,tone, power   Data Reviewed: I have personally reviewed following labs and imaging studies  CBC: Recent Labs  Lab 11/12/22 0556 11/13/22 0519 11/15/22 0341  WBC 18.0* 15.6* 10.4  HGB 11.3* 10.5* 10.1*  HCT 33.6* 31.6* 30.3*  MCV 93.9 94.6 96.5  PLT 271 241 254    Basic Metabolic Panel: Recent Labs  Lab 11/12/22 0556 11/13/22 0519 11/15/22 0341 11/17/22 0557 11/18/22 0654  NA 137 139 144 141 138  K 4.4 4.2 3.6 2.8* 3.7  CL 106 111 114* 108 101  CO2 '23 23 25 27 29  '$ GLUCOSE 164* 143* 147* 110* 114*  BUN 22* '20 18 15 12  '$ CREATININE 1.01* 0.79 0.69 0.82  0.76  CALCIUM 8.8* 8.7* 8.1* 8.4* 8.8*      Recent Results (from the past 240 hour(s))  Resp Panel by RT-PCR (Flu A&B, Covid) Anterior Nasal Swab     Status: None   Collection Time: 11/11/22  5:33 AM   Specimen: Anterior Nasal Swab  Result Value Ref Range Status   SARS Coronavirus 2 by RT PCR NEGATIVE NEGATIVE Final    Comment: (NOTE) SARS-CoV-2 target nucleic acids are NOT DETECTED.  The SARS-CoV-2 RNA is generally detectable in upper respiratory specimens during the acute phase of infection. The lowest concentration of SARS-CoV-2 viral copies this assay can detect is 138 copies/mL. A negative result does not preclude SARS-Cov-2 infection and should not be used as the sole basis for treatment or other patient management decisions. A negative result  may occur with  improper specimen collection/handling, submission of specimen other than nasopharyngeal swab, presence of viral mutation(s) within the areas targeted by this assay, and inadequate number of viral copies(<138 copies/mL). A negative result must be combined with clinical observations, patient history, and epidemiological information. The expected result is Negative.  Fact Sheet for Patients:  EntrepreneurPulse.com.au  Fact Sheet for Healthcare Providers:  IncredibleEmployment.be  This test is no t yet approved or cleared by the [place] FDA and  has been authorized for detection and/or diagnosis of SARS-CoV-2 by FDA under an Emergency Use Authorization (EUA). This EUA will remain  in effect (meaning this test can be used) for the duration of the COVID-19 declaration under Section 564(b)(1) of the Act, 21 U.S.C.section 360bbb-3(b)(1), unless the authorization is terminated  or revoked sooner.       Influenza A by PCR NEGATIVE NEGATIVE Final   Influenza B by PCR NEGATIVE NEGATIVE Final    Comment: (NOTE) The Xpert Xpress SARS-CoV-2/FLU/RSV plus assay is intended as an aid in the diagnosis of influenza from Nasopharyngeal swab specimens and should not be used as a sole basis for treatment. Nasal washings and aspirates are unacceptable for Xpert Xpress SARS-CoV-2/FLU/RSV testing.  Fact Sheet for Patients: EntrepreneurPulse.com.au  Fact Sheet for Healthcare Providers: IncredibleEmployment.be  This test is not yet approved or cleared by the [place] FDA and has been authorized for detection and/or diagnosis of SARS-CoV-2 by FDA under an Emergency Use Authorization (EUA). This EUA will remain in effect (meaning this test can be used) for the duration of the COVID-19 declaration under Section 564(b)(1) of the Act, 21 U.S.C. section 360bbb-3(b)(1), unless the authorization is terminated  or revoked.  Performed at [facility], [address] [place], [place] [address]   Respiratory (~20 pathogens) panel by PCR     Status: None   Collection Time: 11/11/22  5:33 AM   Specimen: Anterior Nasal Swab; Respiratory  Result Value Ref Range Status   Adenovirus NOT DETECTED NOT DETECTED Final   Coronavirus 229E NOT DETECTED NOT DETECTED Final    Comment: (NOTE) The Coronavirus on the Respiratory Panel, DOES NOT test for the novel  Coronavirus (2019 nCoV)    Coronavirus HKU1 NOT DETECTED NOT DETECTED Final   Coronavirus NL63 NOT DETECTED NOT DETECTED Final   Coronavirus OC43 NOT DETECTED NOT DETECTED Final   Metapneumovirus NOT DETECTED NOT DETECTED Final   Rhinovirus / Enterovirus NOT DETECTED NOT DETECTED Final   Influenza A NOT DETECTED NOT DETECTED Final   Influenza B NOT DETECTED NOT DETECTED Final   Parainfluenza Virus 1 NOT DETECTED NOT DETECTED Final   Parainfluenza Virus 2 NOT DETECTED NOT DETECTED Final  Parainfluenza Virus 3 NOT DETECTED NOT DETECTED Final   Parainfluenza Virus 4 NOT DETECTED NOT DETECTED Final   Respiratory Syncytial Virus NOT DETECTED NOT DETECTED Final   Bordetella pertussis NOT DETECTED NOT DETECTED Final   Bordetella Parapertussis NOT DETECTED NOT DETECTED Final   Chlamydophila pneumoniae NOT DETECTED NOT DETECTED Final   Mycoplasma pneumoniae NOT DETECTED NOT DETECTED Final    Comment: Performed at [facility], [place] [address] [place], [place] [address]  Expectorated Sputum Assessment w Gram Stain, Rflx to Resp Cult     Status: None   Collection Time: 11/11/22  6:58 AM   Specimen: Sputum  Result Value Ref Range Status   Specimen Description SPUTUM  Final   Special Requests NONE  Final   Sputum evaluation   Final    THIS SPECIMEN IS ACCEPTABLE FOR SPUTUM CULTURE Performed at [facility], [address], [place], [place] [address]    Report Status 11/11/2022 FINAL  Final  Culture, Respiratory w Gram Stain      Status: None   Collection Time: 11/11/22  6:58 AM   Specimen: SPU  Result Value Ref Range Status   Specimen Description   Final    SPUTUM Performed at [facility], [address], [place], [place] [address]    Special Requests   Final    NONE Reflexed from [id number] Performed at [facility], [address] [place], [place] [address]    Gram Stain NO WBC SEEN FEW GRAM POSITIVE COCCI IN PAIRS   Final   Culture   Final    RARE STAPHYLOCOCCUS AUREUS WITHIN MIXED ORGANISMS Performed at [facility], [place] [address] [place], [place] [address]    Report Status 11/14/2022 FINAL  Final   Organism ID, Bacteria STAPHYLOCOCCUS AUREUS  Final      Susceptibility   Staphylococcus aureus - MIC*    CIPROFLOXACIN <=0.5 SENSITIVE Sensitive     ERYTHROMYCIN <=0.25 SENSITIVE Sensitive     GENTAMICIN <=0.5 SENSITIVE Sensitive     OXACILLIN 0.5 SENSITIVE Sensitive     TETRACYCLINE <=1 SENSITIVE Sensitive     VANCOMYCIN <=0.5 SENSITIVE Sensitive     TRIMETH/SULFA <=10 SENSITIVE Sensitive     CLINDAMYCIN <=0.25 SENSITIVE Sensitive     RIFAMPIN <=0.5 SENSITIVE Sensitive     Inducible Clindamycin NEGATIVE Sensitive     * RARE STAPHYLOCOCCUS AUREUS  Culture, blood (x 2)     Status: None   Collection Time: 11/11/22  8:09 AM   Specimen: BLOOD RIGHT ARM  Result Value Ref Range Status   Specimen Description BLOOD RIGHT ARM  Final   Special Requests   Final    BOTTLES DRAWN AEROBIC AND ANAEROBIC Blood Culture adequate volume   Culture   Final    NO GROWTH 5 DAYS Performed at [facility], [address], [place], [place] [address]    Report Status 11/16/2022 FINAL  Final  Culture, blood (x 2)     Status: None   Collection Time: 11/11/22  9:15 AM   Specimen: BLOOD RIGHT FOREARM  Result Value Ref Range Status   Specimen Description BLOOD RIGHT FOREARM  Final   Special Requests   Final    BOTTLES DRAWN AEROBIC AND ANAEROBIC Blood Culture adequate volume    Culture   Final    NO GROWTH 5 DAYS Performed at [facility], [address] [place], [place] [address]    Report Status 11/16/2022 FINAL  Final  MRSA  Next Gen by PCR, Nasal     Status: None   Collection Time: 11/12/22  1:46 PM   Specimen: Nasal Mucosa; Nasal Swab  Result Value Ref Range Status   MRSA by PCR Next Gen NOT DETECTED NOT DETECTED Final    Comment: (NOTE) The GeneXpert MRSA Assay (FDA approved for NASAL specimens only), is one component of a comprehensive MRSA colonization surveillance program. It is not intended to diagnose MRSA infection nor to guide or monitor treatment for MRSA infections. Test performance is not FDA approved in patients less than 65 years old. Performed at [facility], [address] [place], [place] [address]      Radiology Studies: CT Chest High Resolution  Result Date: 11/16/2022 CLINICAL DATA:  Rule out tracheobronchomalacia EXAM: CT CHEST WITHOUT CONTRAST TECHNIQUE: Multidetector CT imaging of the chest was performed following the standard protocol without intravenous contrast. High resolution imaging of the lungs, as well as inspiratory and expiratory imaging, was performed. RADIATION DOSE REDUCTION: This exam was performed according to the departmental dose-optimization program which includes automated exposure control, adjustment of the mA and/or kV according to patient size and/or use of iterative reconstruction technique. COMPARISON:  None Available. FINDINGS: Cardiovascular: No significant vascular findings. Normal heart size. No pericardial effusion. Mediastinum/Nodes: No enlarged mediastinal, hilar, or axillary lymph nodes. Thyroid gland and esophagus demonstrate no significant findings. Lungs/Pleura: Minimal dependent bibasilar scarring and or partial atelectasis. No significant air trapping on expiratory phase imaging. Moderate tracheobronchomalacia (series 9, image 109, 127). No pleural effusion or pneumothorax. Upper  Abdomen: No acute abnormality. Hepatic steatosis. Status post cholecystectomy Musculoskeletal: No chest wall abnormality. Implantable loop recorder. No acute osseous findings. IMPRESSION: 1. Moderate tracheobronchomalacia on expiratory phase imaging. 2. No significant air trapping. 3. No acute abnormality of the lungs. 4. Hepatic steatosis. Electronically Signed   By: Delanna Ahmadi M.D.   On: 11/16/2022 13:18    Scheduled Meds:  baclofen  10 mg Oral QHS   cycloSPORINE  1 drop Both Eyes BID   enoxaparin (LOVENOX) injection  40 mg Subcutaneous Q24H   famotidine  40 mg Oral QHS   fluconazole  300 mg Oral Q M,W,F   folic acid  2 mg Oral Daily   furosemide  40 mg Oral BID   ipratropium-albuterol  3 mL Nebulization BID   metoCLOPramide  10 mg Oral BID   metoprolol succinate  50 mg Oral Daily   mometasone-formoterol  2 puff Inhalation BID   nystatin  10 mL Oral TID   olopatadine  1 drop Both Eyes BID   pantoprazole  40 mg Oral BID   polyvinyl alcohol  1 drop Both Eyes BID   potassium chloride  40 mEq Oral Daily   pravastatin  40 mg Oral QHS   sucralfate  1 g Oral QID   topiramate  100 mg Oral Daily   topiramate  25 mg Oral QHS   Continuous Infusions:  sodium chloride Stopped (11/12/22 0717)     LOS: 6 days   Antonieta Pert, MD [facility] P11/24/2023, 10:59 AM

## 2022-11-19 DIAGNOSIS — J398 Other specified diseases of upper respiratory tract: Secondary | ICD-10-CM | POA: Diagnosis not present

## 2022-11-19 LAB — BASIC METABOLIC PANEL
Anion gap: 11 (ref 5–15)
BUN: 15 mg/dL (ref 6–20)
CO2: 30 mmol/L (ref 22–32)
Calcium: 9.3 mg/dL (ref 8.9–10.3)
Chloride: 93 mmol/L — ABNORMAL LOW (ref 98–111)
Creatinine, Ser: 0.91 mg/dL (ref 0.44–1.00)
GFR, Estimated: >60 mL/min (ref >60)
Glucose, Bld: 125 mg/dL — ABNORMAL HIGH (ref 70–99)
Potassium: 4.8 mmol/L (ref 3.5–5.1)
Sodium: 134 mmol/L — ABNORMAL LOW (ref 135–145)

## 2022-11-19 LAB — CBC
HCT: 41.4 % (ref 36.0–46.0)
Hemoglobin: 13.5 g/dL (ref 12.0–15.0)
MCH: 31.3 pg (ref 26.0–34.0)
MCHC: 32.6 g/dL (ref 30.0–36.0)
MCV: 95.8 fL (ref 80.0–100.0)
Platelets: 241 10*3/uL (ref 150–400)
RBC: 4.32 MIL/uL (ref 3.87–5.11)
RDW: 14.3 % (ref 11.5–15.5)
WBC: 10.2 10*3/uL (ref 4.0–10.5)
nRBC: 0 % (ref 0.0–0.2)

## 2022-11-19 NOTE — Plan of Care (Signed)
  Problem: Clinical Measurements: Goal: Ability to maintain clinical measurements within normal limits will improve Outcome: Progressing   

## 2022-11-19 NOTE — Progress Notes (Signed)
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 tach/atrial tachycardia cardia status post loop lead replacement, obesity who presented from home on 11/11/2022 with shortness of breath, cough, wheezing, pleuritic chest pain, subjective fever and chills.  Patient was on  methotrexate, prednisone, IVIG for compromised immune system but currently on hold because of recent oral thrush.  She follows with rheumatology, immunology at [facility] .chest x-ray on presentation showed lower lung volume without infiltration.  COVID/flu negative.  CT angio negative for PE.  Patient was empirically started on broad antibiotics as well as steroids.  Hospital course remarkable for persistent productive cough,wheezing.  Cultures have not for any growth.  HRCT- showed moderate tracheobronchomalacia.  Antibiotics, steroids discontinued as per pulmonology.   Assessment & Plan:  Tracheomalacia Shortness of breath: -Initially presented with dyspnea wheezing cough seen by pulmonary and underwent extensive work-up with HRCT:showed moderate tracheobronchomalacia-felt to be the cause of her presentation, subsequently steroids antibiotics were discontinued per pulmonary, cultures unremarkable.  Currently doing well on room air -PER PULM RECS:cont Symbicort (80-4.5 two puffs twice daily AND every 4 hours PRN) on discharge and  to establish care with interventional pulmonary at [facility] to look into her tracheobronchomalacia,advised  pursed lip breathing as that helps stent open her airways. Cont antitussives prn. -Prescription for Symbicort was sent to her pharmacy,> remains on Dulera in house.  -Will arrange nebulizer at the time of  discharge. -Continue spirometry   Hypokalemia: Resolved  Specific antibody deficiency/history of rheumatoid arthritis: With normal immunoglobulin concentration, normal B cells.  Followed by immunology at [facility], taking dupixent   HLD: Cont pravastatin   History of lymphedema -Complains of weight gain/worsening leg edema: - Followed by lymphedema clinic.  -On Lasix 60 twice daily.  Anxiety disorder: On Ativan as needed  Migraine on sumatriptan prn and Topamax  History of eosinophilic esophagitis: Continue Pepcid and Carafate  History of nonsustained V. tach/atrial tachycardia: Has loop recorder on place, continue on metoprolol  Oral thrush: On fluconazole, nystatin  Left wrist fracture: She is following with orthopedics as an outpatient  Class I obesity with BMI more than 30 continue current weight loss   Low blood pressure: Held metoprolol and Lasix this a.m.  Will continue to monitor blood pressure closely.  Chronic pain: Followed by pain management once a month outpatient. -Continue baclofen, oxycodone as needed  DVT prophylaxis: Lovenox Code Status: Full code Family Communication:  None present at bedside.  Plan of care discussed with patient in length and she verbalized understanding and agreed with it. Disposition Plan: Likely home tomorrow  Consultants:  Pulmonology  Procedures:  None   Status is: Inpatient     Subjective: Patient seen and examined.  Sitting comfortably on the bed.  Reports that overall she feels better today, slept well last night.  No fever overnight.  No acute events overnight.  Continues to have yellow-green sputum production which improves with nebulizer treatment.  She requested arrangement for nebulizer at the time of discharge.  Tells me that although she feels better but not comfortable going home today.  Tells me that she has history of lymphedema and followed by lymphedema clinic.  Takes Lasix 60 twice daily.  She thinks that her  lymphedema is getting worse.  Objective: Vitals:   11/18/22 2100 11/19/22  0146 11/19/22 0442 11/19/22 0814  BP: 108/66  (!) 103/90 90/61  Pulse: (!) 104  71 71  Resp: '18  18 17  '$ Temp:   97.6 F (36.4 C) (!) 97.5 F (36.4 C)  TempSrc:      SpO2: 94%  97% 97%  Weight:  87.8 kg    Height:       No intake or output data in the 24 hours ending 11/19/22 1110 Filed Weights   11/11/22 0422 11/18/22 1108 11/19/22 0146  Weight: 81.2 kg 87.2 kg 87.8 kg    Examination:  General exam: Appears calm and comfortable, on room air, communicating well. Respiratory system: Respiratory effort normal.  Scant expiratory wheezing noted on the bases. Cardiovascular system: S1 & S2 heard, RRR. No JVD, murmurs, rubs, gallops or clicks. No pedal edema. Gastrointestinal system: Abdomen is nondistended, soft and nontender. No organomegaly or masses felt. Normal bowel sounds heard. Central nervous system: Alert and oriented. No focal neurological deficits. Extremities: Lymphedema in bilateral lower extremities. Skin: No rashes, lesions or ulcers Psychiatry: Judgement and insight appear normal. Mood & affect appropriate.    Data Reviewed: I have personally reviewed following labs and imaging studies  CBC: Recent Labs  Lab 11/13/22 0519 11/15/22 0341 11/19/22 0525  WBC 15.6* 10.4 10.2  HGB 10.5* 10.1* 13.5  HCT 31.6* 30.3* 41.4  MCV 94.6 96.5 95.8  PLT 241 251 923   Basic Metabolic Panel: Recent Labs  Lab 11/13/22 0519 11/15/22 0341 11/17/22 0557 11/18/22 0654 11/19/22 0525  NA 139 144 141 138 134*  K 4.2 3.6 2.8* 3.7 4.8  CL 111 114* 108 101 93*  CO2 '23 25 27 29 30  '$ GLUCOSE 143* 147* 110* 114* 125*  BUN '20 18 15 12 15  '$ CREATININE 0.79 0.69 0.82 0.76 0.91  CALCIUM 8.7* 8.1* 8.4* 8.8* 9.3   GFR: Estimated Creatinine Clearance: 81 mL/min (by C-G formula based on SCr of 0.91 mg/dL). Liver Function Tests: No results for input(s): "AST", "ALT", "ALKPHOS", "BILITOT", "PROT", "ALBUMIN" in  the last 168 hours. No results for input(s): "LIPASE", "AMYLASE" in the last 168 hours. No results for input(s): "AMMONIA" in the last 168 hours. Coagulation Profile: No results for input(s): "INR", "PROTIME" in the last 168 hours. Cardiac Enzymes: No results for input(s): "CKTOTAL", "CKMB", "CKMBINDEX", "TROPONINI" in the last 168 hours. BNP (last 3 results) No results for input(s): "PROBNP" in the last 8760 hours. HbA1C: No results for input(s): "HGBA1C" in the last 72 hours. CBG: No results for input(s): "GLUCAP" in the last 168 hours. Lipid Profile: No results for input(s): "CHOL", "HDL", "LDLCALC", "TRIG", "CHOLHDL", "LDLDIRECT" in the last 72 hours. Thyroid Function Tests: No results for input(s): "TSH", "T4TOTAL", "FREET4", "T3FREE", "THYROIDAB" in the last 72 hours. Anemia Panel: No results for input(s): "VITAMINB12", "FOLATE", "FERRITIN", "TIBC", "IRON", "RETICCTPCT" in the last 72 hours. Sepsis Labs: Recent Labs  Lab 11/13/22 0519  LATICACIDVEN 2.2*    Recent Results (from the past 240 hour(s))  Resp Panel by RT-PCR (Flu A&B, Covid) Anterior Nasal Swab     Status: None   Collection Time: 11/11/22  5:33 AM   Specimen: Anterior Nasal Swab  Result Value Ref Range Status   SARS Coronavirus 2 by RT PCR NEGATIVE NEGATIVE Final    Comment: (NOTE) SARS-CoV-2 target nucleic acids are NOT DETECTED.  The SARS-CoV-2 RNA is generally detectable in upper respiratory specimens during the acute phase of infection. The lowest concentration of SARS-CoV-2 viral copies this assay can detect is 138 copies/mL.  A negative result does not preclude SARS-Cov-2 infection and should not be used as the sole basis for treatment or other patient management decisions. A negative result may occur with  improper specimen collection/handling, submission of specimen other than nasopharyngeal swab, presence of viral mutation(s) within the areas targeted by this assay, and inadequate number of  viral copies(<138 copies/mL). A negative result must be combined with clinical observations, patient history, and epidemiological information. The expected result is Negative.  Fact Sheet for Patients:  EntrepreneurPulse.com.au  Fact Sheet for Healthcare Providers:  IncredibleEmployment.be  This test is no t yet approved or cleared by the [place] FDA and  has been authorized for detection and/or diagnosis of SARS-CoV-2 by FDA under an Emergency Use Authorization (EUA). This EUA will remain  in effect (meaning this test can be used) for the duration of the COVID-19 declaration under Section 564(b)(1) of the Act, 21 U.S.C.section 360bbb-3(b)(1), unless the authorization is terminated  or revoked sooner.       Influenza A by PCR NEGATIVE NEGATIVE Final   Influenza B by PCR NEGATIVE NEGATIVE Final    Comment: (NOTE) The Xpert Xpress SARS-CoV-2/FLU/RSV plus assay is intended as an aid in the diagnosis of influenza from Nasopharyngeal swab specimens and should not be used as a sole basis for treatment. Nasal washings and aspirates are unacceptable for Xpert Xpress SARS-CoV-2/FLU/RSV testing.  Fact Sheet for Patients: EntrepreneurPulse.com.au  Fact Sheet for Healthcare Providers: IncredibleEmployment.be  This test is not yet approved or cleared by the [place] FDA and has been authorized for detection and/or diagnosis of SARS-CoV-2 by FDA under an Emergency Use Authorization (EUA). This EUA will remain in effect (meaning this test can be used) for the duration of the COVID-19 declaration under Section 564(b)(1) of the Act, 21 U.S.C. section 360bbb-3(b)(1), unless the authorization is terminated or revoked.  Performed at [facility], [address] [place], [place] [address]   Respiratory (~20 pathogens) panel by PCR     Status: None   Collection Time: 11/11/22  5:33 AM   Specimen:  Anterior Nasal Swab; Respiratory  Result Value Ref Range Status   Adenovirus NOT DETECTED NOT DETECTED Final   Coronavirus 229E NOT DETECTED NOT DETECTED Final    Comment: (NOTE) The Coronavirus on the Respiratory Panel, DOES NOT test for the novel  Coronavirus (2019 nCoV)    Coronavirus HKU1 NOT DETECTED NOT DETECTED Final   Coronavirus NL63 NOT DETECTED NOT DETECTED Final   Coronavirus OC43 NOT DETECTED NOT DETECTED Final   Metapneumovirus NOT DETECTED NOT DETECTED Final   Rhinovirus / Enterovirus NOT DETECTED NOT DETECTED Final   Influenza A NOT DETECTED NOT DETECTED Final   Influenza B NOT DETECTED NOT DETECTED Final   Parainfluenza Virus 1 NOT DETECTED NOT DETECTED Final   Parainfluenza Virus 2 NOT DETECTED NOT DETECTED Final   Parainfluenza Virus 3 NOT DETECTED NOT DETECTED Final   Parainfluenza Virus 4 NOT DETECTED NOT DETECTED Final   Respiratory Syncytial Virus NOT DETECTED NOT DETECTED Final   Bordetella pertussis NOT DETECTED NOT DETECTED Final   Bordetella Parapertussis NOT DETECTED NOT DETECTED Final   Chlamydophila pneumoniae NOT DETECTED NOT DETECTED Final   Mycoplasma pneumoniae NOT DETECTED NOT DETECTED Final    Comment: Performed at [facility], [place], [place], [place] [address]  Expectorated Sputum Assessment w Gram Stain, Rflx to Resp Cult     Status: None   Collection Time: 11/11/22  6:58 AM   Specimen: Sputum  Result Value Ref Range Status   Specimen Description SPUTUM  Final   Special Requests NONE  Final   Sputum evaluation   Final    THIS SPECIMEN IS ACCEPTABLE FOR SPUTUM CULTURE Performed at [facility], [address] [place], [place] [address]    Report Status 11/11/2022 FINAL  Final  Culture, Respiratory w Gram Stain     Status: None   Collection Time: 11/11/22  6:58 AM   Specimen: SPU  Result Value Ref Range Status   Specimen Description   Final    SPUTUM Performed at [facility], [address]  [place], [place] [address]    Special Requests   Final    NONE Reflexed from [id number] Performed at [facility], [address] [place], [place] [address]    Gram Stain NO WBC SEEN FEW GRAM POSITIVE COCCI IN PAIRS   Final   Culture   Final    RARE STAPHYLOCOCCUS AUREUS WITHIN MIXED ORGANISMS Performed at [facility], [place] [address] [place], [place] [address]    Report Status 11/14/2022 FINAL  Final   Organism ID, Bacteria STAPHYLOCOCCUS AUREUS  Final      Susceptibility   Staphylococcus aureus - MIC*    CIPROFLOXACIN <=0.5 SENSITIVE Sensitive     ERYTHROMYCIN <=0.25 SENSITIVE Sensitive     GENTAMICIN <=0.5 SENSITIVE Sensitive     OXACILLIN 0.5 SENSITIVE Sensitive     TETRACYCLINE <=1 SENSITIVE Sensitive     VANCOMYCIN <=0.5 SENSITIVE Sensitive     TRIMETH/SULFA <=10 SENSITIVE Sensitive     CLINDAMYCIN <=0.25 SENSITIVE Sensitive     RIFAMPIN <=0.5 SENSITIVE Sensitive     Inducible Clindamycin NEGATIVE Sensitive     * RARE STAPHYLOCOCCUS AUREUS  Culture, blood (x 2)     Status: None   Collection Time: 11/11/22  8:09 AM   Specimen: BLOOD RIGHT ARM  Result Value Ref Range Status   Specimen Description BLOOD RIGHT ARM  Final   Special Requests   Final    BOTTLES DRAWN AEROBIC AND ANAEROBIC Blood Culture adequate volume   Culture   Final    NO GROWTH 5 DAYS Performed at [facility], [address] [place], [place] [address]    Report Status 11/16/2022 FINAL  Final  Culture, blood (x 2)     Status: None   Collection Time: 11/11/22  9:15 AM   Specimen: BLOOD RIGHT FOREARM  Result Value Ref Range Status   Specimen Description BLOOD RIGHT FOREARM  Final   Special Requests   Final    BOTTLES DRAWN AEROBIC AND ANAEROBIC Blood Culture adequate volume   Culture   Final    NO GROWTH 5 DAYS Performed at [facility], [address] [place], [place] [address]    Report Status 11/16/2022 FINAL  Final  MRSA Next Gen by PCR, Nasal     Status: None    Collection Time: 11/12/22  1:46 PM   Specimen: Nasal Mucosa; Nasal Swab  Result Value Ref Range Status   MRSA by PCR Next Gen NOT DETECTED NOT DETECTED Final    Comment: (NOTE) The GeneXpert MRSA Assay (FDA approved for NASAL specimens only), is one component of a comprehensive MRSA colonization surveillance program. It is not intended to diagnose MRSA infection nor to guide or monitor treatment for MRSA infections. Test performance is not FDA approved in patients less than 32 years old. Performed at [facility], [address], [place],  [address]  Radiology Studies: No results found.  Scheduled Meds:  baclofen  10 mg Oral QHS   cycloSPORINE  1 drop Both Eyes BID   enoxaparin (LOVENOX) injection  40 mg Subcutaneous Q24H   famotidine  40 mg Oral QHS   fluconazole  300 mg Oral Q M,W,F   folic acid  2 mg Oral Daily   furosemide  60 mg Oral BID   ipratropium-albuterol  3 mL Nebulization BID   metoCLOPramide  10 mg Oral BID   metoprolol succinate  50 mg Oral Daily   mometasone-formoterol  2 puff Inhalation BID   nystatin  10 mL Oral TID   olopatadine  1 drop Both Eyes BID   pantoprazole  40 mg Oral BID   polyvinyl alcohol  1 drop Both Eyes BID   potassium chloride  40 mEq Oral Daily   pravastatin  40 mg Oral QHS   sucralfate  1 g Oral QID   topiramate  100 mg Oral Daily   topiramate  25 mg Oral QHS   Continuous Infusions:  sodium chloride Stopped (11/12/22 0717)     LOS: 7 days   Time spent: 35 minutes   Demico Ploch Loann Quill, MD [facility]  If 7PM-7AM, please contact night-coverage [email or web address] 11/19/2022, 11:11 AM

## 2022-11-20 DIAGNOSIS — J398 Other specified diseases of upper respiratory tract: Secondary | ICD-10-CM | POA: Diagnosis not present

## 2022-11-20 LAB — BASIC METABOLIC PANEL
Anion gap: 9 (ref 5–15)
BUN: 13 mg/dL (ref 6–20)
CO2: 26 mmol/L (ref 22–32)
Calcium: 8.9 mg/dL (ref 8.9–10.3)
Chloride: 102 mmol/L (ref 98–111)
Creatinine, Ser: 0.82 mg/dL (ref 0.44–1.00)
GFR, Estimated: >60 mL/min (ref >60)
Glucose, Bld: 120 mg/dL — ABNORMAL HIGH (ref 70–99)
Potassium: 4 mmol/L (ref 3.5–5.1)
Sodium: 137 mmol/L (ref 135–145)

## 2022-11-20 LAB — CBC
HCT: 37.2 % (ref 36.0–46.0)
Hemoglobin: 12.1 g/dL (ref 12.0–15.0)
MCH: 31.4 pg (ref 26.0–34.0)
MCHC: 32.5 g/dL (ref 30.0–36.0)
MCV: 96.6 fL (ref 80.0–100.0)
Platelets: 208 10*3/uL (ref 150–400)
RBC: 3.85 MIL/uL — ABNORMAL LOW (ref 3.87–5.11)
RDW: 14.5 % (ref 11.5–15.5)
WBC: 8.2 10*3/uL (ref 4.0–10.5)
nRBC: 0 % (ref 0.0–0.2)

## 2022-11-20 MED ORDER — ENOXAPARIN SODIUM 60 MG/0.6ML IJ SOSY: 0.5000 mg/kg | PREFILLED_SYRINGE | INTRAMUSCULAR | Status: DC

## 2022-11-20 MED ORDER — CYCLOBENZAPRINE HCL 10 MG PO TABS: 10.0000 mg | ORAL_TABLET | Freq: Every day | ORAL | 0 refills | Status: DC

## 2022-11-20 MED ORDER — ALBUTEROL SULFATE (2.5 MG/3ML) 0.083% IN NEBU: 2.5000 mg | INHALATION_SOLUTION | RESPIRATORY_TRACT | 3 refills | Status: AC | PRN

## 2022-11-20 NOTE — TOC Progression Note (Signed)
Transition of Care [facility]) - Progression Note    Patient Details  Name: [name] Horn MRN: [id number] Date of Birth: [birth year]  Transition of Care [facility] CM/SW Contact  Eileen Stanford, LCSW Phone Number: 11/20/2022, 1:10 PM  Clinical Narrative:   Nebulizer ordered through [employer]. They will deliver it to bedside. MD and Pt are aware.    Expected Discharge Plan: Home/Self Care Barriers to Discharge: Continued Medical Work up  Expected Discharge Plan and Services Expected Discharge Plan: Home/Self Care       Living arrangements for the past 2 months: Single Family Home Expected Discharge Date: 11/20/22                                     Social Determinants of Health (SDOH) Interventions    Readmission Risk Interventions    11/13/2022   10:26 AM  Readmission Risk Prevention Plan  Transportation Screening Complete  PCP or Specialist Appt within 3-5 Days Complete  HRI or [place] Complete  Social Work Consult for [place] Planning/Counseling Complete  Palliative Care Screening Not Applicable  Medication Review [employer] photographer) Complete

## 2022-11-20 NOTE — Discharge Summary (Signed)
Physician Discharge Summary  [name] Horn EGB:[id number] DOB: [birth year] DOA: 11/11/2022  PCP: Dierdre Searles, PA-C  Admit date: 11/11/2022 Discharge date: 11/20/2022  Admitted From: Home Disposition: Home  Recommendations for Outpatient Follow-up:  Follow up with PCP in 1-2 weeks Please obtain BMP/CBC in one week Continue Symbicort as recommended by PCCM. Nebulizer treatment every 4-6 hours as needed for shortness of breath and wheezing. Continue spirometry. Need to establish care with interventional pulmonology at [facility] for further evaluation and management of your ongoing symptoms. 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 tach/atrial tachycardia cardia status post loop lead replacement, obesity who presented from home on 11/11/2022 with shortness of breath, cough, wheezing, pleuritic chest pain, subjective fever and chills.  Patient was on  methotrexate, prednisone, IVIG for compromised immune system but currently on hold because of recent oral thrush.  She follows with rheumatology, immunology at [facility] .chest x-ray on presentation showed lower lung volume without infiltration.  COVID/flu negative.  CT angio negative for PE.  Patient was empirically started on broad antibiotics as well as steroids.  Hospital course remarkable for persistent productive cough,wheezing.  Cultures have not for any growth.  HRCT- showed moderate tracheobronchomalacia.  Antibiotics, steroids discontinued as per pulmonology.     Tracheobronchomalacia Shortness of breath: -Her symptoms improved.  Remained on room air.  Remained afebrile.  No leukocytosis. -Per PCCM recommendations cont Symbicort (80-4.5 two puffs twice daily AND every 4 hours PRN) on discharge and  to establish care with interventional pulmonary at [facility] to look into her tracheobronchomalacia,advised  pursed lip breathing as that helps stent open her airways. Cont antitussives prn. -Prescription for Symbicort was sent to her pharmacy -Nebulizer arranged at the time of discharge.  Prescription for neb solution sent to the pharmacy.  Hypokalemia: Resolved   Specific antibody deficiency/history of rheumatoid arthritis: With normal immunoglobulin concentration, normal B cells.  Followed by immunology at [facility], taking dupixent   HLD: Continued pravastatin   History of lymphedema - Followed by lymphedema clinic.  -Continued Lasix 60 twice daily.   Anxiety disorder: Continued Ativan as needed   Migraine continued sumatriptan prn and Topamax   History of eosinophilic esophagitis: Continued Pepcid and Carafate   History of nonsustained V. tach/atrial tachycardia: Has loop recorder on place, continued on metoprolol   Oral thrush: Continued on fluconazole, nystatin   Left wrist fracture: She is following with orthopedics as an outpatient   Class I obesity with BMI more than 30 continue current weight loss    Chronic pain: Followed by pain management once a month outpatient. -Continued baclofen, oxycodone as needed -Requested for prescription for Flexeril that she can take as needed for muscle spasm.  Discharge Diagnoses:  Tracheobronchomalacia Hypokalemia Specific antibody deficiency History of rheumatoid arthritis Hyperlipidemia Lymphedema bilateral lower extremity Anxiety disorder Migraine History of fizzing of leg esophagitis History of nonsustained V. tach/atrial tachycardia Oral thrush Left wrist fracture Chronic pain  syndrome     Discharge Instructions  Discharge Instructions     Diet - low sodium heart healthy   Complete by: As directed    Increase activity slowly   Complete by: As directed  Allergies as of 11/20/2022       Reactions   Other    Dermabond surgical glue.    Sulfa Antibiotics Anaphylaxis, Shortness Of Breath, Swelling, Rash   Angioedema (also)   Sulfonamide Derivatives Hives, Shortness Of Breath, Swelling   TONGUE SWELLS   Benadryl [diphenhydramine Hcl] Other (See Comments)   Hyperactivity   Silicone    Watch band         Medication List     TAKE these medications    albuterol (2.5 MG/3ML) 0.083% nebulizer solution Commonly known as: PROVENTIL Take 3 mLs (2.5 mg total) by nebulization every 4 (four) hours as needed for wheezing or shortness of breath.   amphetamine-dextroamphetamine 20 MG tablet Commonly known as: ADDERALL Take 20 mg by mouth 3 (three) times daily.   atropine 1 % ophthalmic solution Place 1 drop into the left eye daily as needed (for dry eye).   baclofen 10 MG tablet Commonly known as: LIORESAL Take 10 mg by mouth at bedtime.   Bepotastine Besilate 1.5 % Soln Place 1 drop into both eyes 2 (two) times a day.   budesonide-formoterol 80-4.5 MCG/ACT inhaler Commonly known as: Symbicort Inhale 2 puffs into the lungs 2 (two) times daily. Can also take Q4 hr as needed for wheezing   cyclobenzaprine 10 MG tablet Commonly known as: FLEXERIL Take 1 tablet (10 mg total) by mouth at bedtime. What changed:  when to take this reasons to take this   cycloSPORINE 0.05 % ophthalmic emulsion Commonly known as: RESTASIS Place 1 drop into both eyes 2 (two) times daily.   dicyclomine 10 MG capsule Commonly known as: BENTYL Take 10 mg by mouth daily as needed for nausea/vomiting.   Dupilumab 300 MG/2ML Sopn Inject 300 mg into the skin once a week. Friday   famotidine 40 MG tablet Commonly known as: PEPCID Take 40 mg by mouth at  bedtime.   fluconazole 150 MG tablet Commonly known as: DIFLUCAN Take 300 mg by mouth every Monday, Wednesday, and Friday.   folic acid 1 MG tablet Commonly known as: FOLVITE Take 2 tablets (2 mg total) by mouth daily.   furosemide 20 MG tablet Commonly known as: LASIX Take 60 mg by mouth 2 (two) times daily.   LORazepam 1 MG tablet Commonly known as: ATIVAN Take 1 mg by mouth 3 (three) times daily as needed for anxiety.   magic mouthwash (nystatin, lidocaine, diphenhydrAMINE, alum & mag hydroxide) suspension Swish and spit 5 mLs 3 (three) times daily as needed for mouth pain.   meloxicam 15 MG tablet Commonly known as: Mobic Take 1 tablet (15 mg total) by mouth daily. What changed:  when to take this reasons to take this   metoCLOPramide 10 MG tablet Commonly known as: REGLAN Take 10 mg by mouth 2 (two) times daily.   metoprolol succinate 25 MG 24 hr tablet Commonly known as: TOPROL-XL Take 50 mg by mouth daily.   nystatin 100000 UNIT/ML suspension Commonly known as: MYCOSTATIN Take 10 mLs (1,000,000 Units total) by mouth 3 (three) times daily.   nystatin powder Generic drug: nystatin Apply 1 application topically 2 (two) times daily as needed (rash).   ondansetron 4 MG disintegrating tablet Commonly known as: ZOFRAN-ODT Take 1 tablet (4 mg total) by mouth every 8 (eight) hours as needed for nausea or vomiting.   oxyCODONE 5 MG immediate release tablet Commonly known as: Roxicodone Take 1 tablet (5 mg total) by mouth every 8 (eight) hours as needed for breakthrough pain (  in addition to your baseline medications).   oxyCODONE-acetaminophen 10-325 MG tablet Commonly known as: PERCOCET Take 1 tablet by mouth every 4 (four) hours as needed for pain.   pantoprazole 40 MG tablet Commonly known as: Protonix Take 1 tablet (40 mg total) by mouth 2 (two) times daily.   Polyethyl Glycol-Propyl Glycol 0.4-0.3 % Gel ophthalmic gel Commonly known as: SYSTANE Place 1  application into both eyes in the morning and at bedtime.   Potassium Chloride ER 20 MEQ Tbcr Take 20 mEq by mouth daily as needed (when taking fursoemide).   pravastatin 40 MG tablet Commonly known as: PRAVACHOL Take 40 mg by mouth at bedtime.   prednisoLONE acetate 1 % ophthalmic suspension Commonly known as: PRED FORTE Place 1 drop into the left eye as needed (conjunctivitis).   promethazine 25 MG tablet Commonly known as: PHENERGAN Take 1 tablet (25 mg total) by mouth every 6 (six) hours as needed for nausea or vomiting.   sucralfate 1 g tablet Commonly known as: Carafate Take 1 tablet (1 g total) by mouth 4 (four) times daily for 7 days.   SUMAtriptan 25 MG tablet Commonly known as: IMITREX Take 25 mg by mouth every 2 (two) hours as needed for migraine. May repeat in 2 hours if headache persists or recurs.   topiramate 25 MG tablet Commonly known as: TOPAMAX Take 25 mg by mouth at bedtime.   topiramate 100 MG tablet Commonly known as: TOPAMAX Take 100 mg by mouth daily.   traZODone 150 MG tablet Commonly known as: DESYREL Take 150 mg by mouth at bedtime as needed for sleep.   triamcinolone lotion 0.1 % Commonly known as: KENALOG Apply 1 application topically daily as needed (rash).   valACYclovir 1000 MG tablet Commonly known as: VALTREX Take 1 tablet (1,000 mg total) by mouth daily.               Durable Medical Equipment  (From admission, onward)           Start     Ordered   11/19/22 0825  For home use only DME Nebulizer machine  Once       Question Answer Comment  Patient needs a nebulizer to treat with the following condition Asthma   Length of Need Lifetime      11/19/22 0824   11/16/22 1440  For home use only DME Nebulizer machine  Once       Question Answer Comment  Patient needs a nebulizer to treat with the following condition Bronchitis   Length of Need Lifetime      11/16/22 1439            Allergies  Allergen Reactions    Other     Dermabond surgical glue.    Sulfa Antibiotics Anaphylaxis, Shortness Of Breath, Swelling and Rash    Angioedema (also)   Sulfonamide Derivatives Hives, Shortness Of Breath and Swelling    TONGUE SWELLS   Benadryl [Diphenhydramine Hcl] Other (See Comments)    Hyperactivity    Silicone     Watch band     Consultations: PCCM   Procedures/Studies: CT Chest High Resolution  Result Date: 11/16/2022 CLINICAL DATA:  Rule out tracheobronchomalacia EXAM: CT CHEST WITHOUT CONTRAST TECHNIQUE: Multidetector CT imaging of the chest was performed following the standard protocol without intravenous contrast. High resolution imaging of the lungs, as well as inspiratory and expiratory imaging, was performed. RADIATION DOSE REDUCTION: This exam was performed according to the departmental dose-optimization program which  includes automated exposure control, adjustment of the mA and/or kV according to patient size and/or use of iterative reconstruction technique. COMPARISON:  None Available. FINDINGS: Cardiovascular: No significant vascular findings. Normal heart size. No pericardial effusion. Mediastinum/Nodes: No enlarged mediastinal, hilar, or axillary lymph nodes. Thyroid gland and esophagus demonstrate no significant findings. Lungs/Pleura: Minimal dependent bibasilar scarring and or partial atelectasis. No significant air trapping on expiratory phase imaging. Moderate tracheobronchomalacia (series 9, image 109, 127). No pleural effusion or pneumothorax. Upper Abdomen: No acute abnormality. Hepatic steatosis. Status post cholecystectomy Musculoskeletal: No chest wall abnormality. Implantable loop recorder. No acute osseous findings. IMPRESSION: 1. Moderate tracheobronchomalacia on expiratory phase imaging. 2. No significant air trapping. 3. No acute abnormality of the lungs. 4. Hepatic steatosis. Electronically Signed   By: Delanna Ahmadi M.D.   On: 11/16/2022 13:18   CT Angio Chest PE W and/or Wo  Contrast  Result Date: 11/11/2022 CLINICAL DATA:  46 year old female with history of shortness of breath and congestion. Mid chest pain. EXAM: CT ANGIOGRAPHY CHEST WITH CONTRAST TECHNIQUE: Multidetector CT imaging of the chest was performed using the standard protocol during bolus administration of intravenous contrast. Multiplanar CT image reconstructions and MIPs were obtained to evaluate the vascular anatomy. RADIATION DOSE REDUCTION: This exam was performed according to the departmental dose-optimization program which includes automated exposure control, adjustment of the mA and/or kV according to patient size and/or use of iterative reconstruction technique. CONTRAST:  50m OMNIPAQUE IOHEXOL 350 MG/ML SOLN COMPARISON:  CTA chest 09/25/2022. FINDINGS: Comment: Today's study is limited by considerable patient respiratory motion. Cardiovascular: Within the limitations of today's examination, there is no central, lobar or proximal segmental sized filling defect. Distal segmental and subsegmental sized filling defects can not be entirely excluded on today's motion limited examination. Heart size is normal. There is no significant pericardial fluid, thickening or pericardial calcification. No atherosclerotic calcifications are noted in the thoracic aorta or the coronary arteries. Mediastinum/Nodes: No pathologically enlarged mediastinal or hilar lymph nodes. Patulous fluid-filled esophagus. No axillary lymphadenopathy. Lungs/Pleura: No acute consolidative airspace disease. No pleural effusions. No definite suspicious appearing pulmonary nodules or masses are noted on today's motion limited examination. Upper Abdomen: Diffuse low attenuation throughout the visualized hepatic parenchyma, indicative of a background of severe hepatic steatosis. Status post cholecystectomy. Musculoskeletal: Electronic device in the medial aspect of the left breast, presumably an implantable loop recorder. There are no aggressive  appearing lytic or blastic lesions noted in the visualized portions of the skeleton. Review of the MIP images confirms the above findings. IMPRESSION: 1. Limited study secondary to patient respiratory motion. No central, lobar or proximal segmental sized pulmonary embolism identified. 2. No other acute findings noted in the thorax to account for the patient's symptoms. 3. Severe hepatic steatosis. Electronically Signed   By: DVinnie LangtonM.D.   On: 11/11/2022 06:28   DG Chest Portable 1 View  Result Date: 11/11/2022 CLINICAL DATA:  46year old female with history of shortness of breath. EXAM: PORTABLE CHEST 1 VIEW COMPARISON:  Chest x-ray 09/25/2022. FINDINGS: Lung volumes are low. No consolidative airspace disease. No pleural effusions. No pneumothorax. No pulmonary nodule or mass noted. Pulmonary vasculature and the cardiomediastinal silhouette are within normal limits. Electronic device projecting over the left hemithorax, presumably an implantable loop recorder. IMPRESSION: 1. Low lung volumes without radiographic evidence of acute cardiopulmonary disease. Electronically Signed   By: DVinnie LangtonM.D.   On: 11/11/2022 05:05      Subjective: Patient seen and examined.  Reports that overall she  feels better.  Denies shortness of breath, chest pain.  No fever overnight.  Comfortable going home today.  Nebulizer in nature at the time of discharge.  Prescription for neb solution sent to the pharmacy. She request for Flexeril refills that she can take as needed for muscle spasm.  She is aware that she needs to establish care with interventional pulmonology at [facility].  Discharge Exam: Vitals:   11/20/22 0827 11/20/22 0941  BP: (!) 94/58 107/68  Pulse: 86 93  Resp: 16   Temp: 97.7 F (36.5 C)   SpO2: 99%    Vitals:   11/20/22 0823 11/20/22 0827 11/20/22 0941 11/20/22 1216  BP:  (!) 94/58 107/68   Pulse:  86 93   Resp:  16    Temp:  97.7 F (36.5 C)    TempSrc:  Oral    SpO2: 98%  99%    Weight:    89 kg  Height:        General: Pt is alert, awake, not in acute distress, on room air, communicating well Cardiovascular: RRR, S1/S2 +, no rubs, no gallops Respiratory: CTA bilaterally, no rhonchi, mild scant wheezing noted on the bases.   Abdominal: Soft, NT, ND, bowel sounds + Extremities: no edema, no cyanosis    The results of significant diagnostics from this hospitalization (including imaging, microbiology, ancillary and laboratory) are listed below for reference.     Microbiology: Recent Results (from the past 240 hour(s))  Resp Panel by RT-PCR (Flu A&B, Covid) Anterior Nasal Swab     Status: None   Collection Time: 11/11/22  5:33 AM   Specimen: Anterior Nasal Swab  Result Value Ref Range Status   SARS Coronavirus 2 by RT PCR NEGATIVE NEGATIVE Final    Comment: (NOTE) SARS-CoV-2 target nucleic acids are NOT DETECTED.  The SARS-CoV-2 RNA is generally detectable in upper respiratory specimens during the acute phase of infection. The lowest concentration of SARS-CoV-2 viral copies this assay can detect is 138 copies/mL. A negative result does not preclude SARS-Cov-2 infection and should not be used as the sole basis for treatment or other patient management decisions. A negative result may occur with  improper specimen collection/handling, submission of specimen other than nasopharyngeal swab, presence of viral mutation(s) within the areas targeted by this assay, and inadequate number of viral copies(<138 copies/mL). A negative result must be combined with clinical observations, patient history, and epidemiological information. The expected result is Negative.  Fact Sheet for Patients:  EntrepreneurPulse.com.au  Fact Sheet for Healthcare Providers:  IncredibleEmployment.be  This test is no t yet approved or cleared by the [place] FDA and  has been authorized for detection and/or diagnosis of SARS-CoV-2  by FDA under an Emergency Use Authorization (EUA). This EUA will remain  in effect (meaning this test can be used) for the duration of the COVID-19 declaration under Section 564(b)(1) of the Act, 21 U.S.C.section 360bbb-3(b)(1), unless the authorization is terminated  or revoked sooner.       Influenza A by PCR NEGATIVE NEGATIVE Final   Influenza B by PCR NEGATIVE NEGATIVE Final    Comment: (NOTE) The Xpert Xpress SARS-CoV-2/FLU/RSV plus assay is intended as an aid in the diagnosis of influenza from Nasopharyngeal swab specimens and should not be used as a sole basis for treatment. Nasal washings and aspirates are unacceptable for Xpert Xpress SARS-CoV-2/FLU/RSV testing.  Fact Sheet for Patients: EntrepreneurPulse.com.au  Fact Sheet for Healthcare Providers: IncredibleEmployment.be  This test is not yet approved or cleared by  the [employer] and has been authorized for detection and/or diagnosis of SARS-CoV-2 by FDA under an Emergency Use Authorization (EUA). This EUA will remain in effect (meaning this test can be used) for the duration of the COVID-19 declaration under Section 564(b)(1) of the Act, 21 U.S.C. section 360bbb-3(b)(1), unless the authorization is terminated or revoked.  Performed at [facility], [address] [place], [place] [address]   Respiratory (~20 pathogens) panel by PCR     Status: None   Collection Time: 11/11/22  5:33 AM   Specimen: Anterior Nasal Swab; Respiratory  Result Value Ref Range Status   Adenovirus NOT DETECTED NOT DETECTED Final   Coronavirus 229E NOT DETECTED NOT DETECTED Final    Comment: (NOTE) The Coronavirus on the Respiratory Panel, DOES NOT test for the novel  Coronavirus (2019 nCoV)    Coronavirus HKU1 NOT DETECTED NOT DETECTED Final   Coronavirus NL63 NOT DETECTED NOT DETECTED Final   Coronavirus OC43 NOT DETECTED NOT DETECTED Final   Metapneumovirus NOT DETECTED NOT  DETECTED Final   Rhinovirus / Enterovirus NOT DETECTED NOT DETECTED Final   Influenza A NOT DETECTED NOT DETECTED Final   Influenza B NOT DETECTED NOT DETECTED Final   Parainfluenza Virus 1 NOT DETECTED NOT DETECTED Final   Parainfluenza Virus 2 NOT DETECTED NOT DETECTED Final   Parainfluenza Virus 3 NOT DETECTED NOT DETECTED Final   Parainfluenza Virus 4 NOT DETECTED NOT DETECTED Final   Respiratory Syncytial Virus NOT DETECTED NOT DETECTED Final   Bordetella pertussis NOT DETECTED NOT DETECTED Final   Bordetella Parapertussis NOT DETECTED NOT DETECTED Final   Chlamydophila pneumoniae NOT DETECTED NOT DETECTED Final   Mycoplasma pneumoniae NOT DETECTED NOT DETECTED Final    Comment: Performed at [facility], [place]. [address], [place], [place] [address]  Expectorated Sputum Assessment w Gram Stain, Rflx to Resp Cult     Status: None   Collection Time: 11/11/22  6:58 AM   Specimen: Sputum  Result Value Ref Range Status   Specimen Description SPUTUM  Final   Special Requests NONE  Final   Sputum evaluation   Final    THIS SPECIMEN IS ACCEPTABLE FOR SPUTUM CULTURE Performed at [facility], [address], [place], [place] [address]    Report Status 11/11/2022 FINAL  Final  Culture, Respiratory w Gram Stain     Status: None   Collection Time: 11/11/22  6:58 AM   Specimen: SPU  Result Value Ref Range Status   Specimen Description   Final    SPUTUM Performed at [facility], [address], [place], [place] [address]    Special Requests   Final    NONE Reflexed from [id number] Performed at [facility], [address] [place], [place] [address]    Gram Stain NO WBC SEEN FEW GRAM POSITIVE COCCI IN PAIRS   Final   Culture   Final    RARE STAPHYLOCOCCUS AUREUS WITHIN MIXED ORGANISMS Performed at [facility], [place] [address], [place],  [address]    Report Status 11/14/2022 FINAL  Final   Organism ID, Bacteria STAPHYLOCOCCUS AUREUS   Final      Susceptibility   Staphylococcus aureus - MIC*    CIPROFLOXACIN <=0.5 SENSITIVE Sensitive     ERYTHROMYCIN <=0.25 SENSITIVE Sensitive     GENTAMICIN <=0.5 SENSITIVE Sensitive     OXACILLIN 0.5 SENSITIVE Sensitive     TETRACYCLINE <=1 SENSITIVE Sensitive     VANCOMYCIN <=0.5  SENSITIVE Sensitive     TRIMETH/SULFA <=10 SENSITIVE Sensitive     CLINDAMYCIN <=0.25 SENSITIVE Sensitive     RIFAMPIN <=0.5 SENSITIVE Sensitive     Inducible Clindamycin NEGATIVE Sensitive     * RARE STAPHYLOCOCCUS AUREUS  Culture, blood (x 2)     Status: None   Collection Time: 11/11/22  8:09 AM   Specimen: BLOOD RIGHT ARM  Result Value Ref Range Status   Specimen Description BLOOD RIGHT ARM  Final   Special Requests   Final    BOTTLES DRAWN AEROBIC AND ANAEROBIC Blood Culture adequate volume   Culture   Final    NO GROWTH 5 DAYS Performed at [facility], [address] [place], [place] [address]    Report Status 11/16/2022 FINAL  Final  Culture, blood (x 2)     Status: None   Collection Time: 11/11/22  9:15 AM   Specimen: BLOOD RIGHT FOREARM  Result Value Ref Range Status   Specimen Description BLOOD RIGHT FOREARM  Final   Special Requests   Final    BOTTLES DRAWN AEROBIC AND ANAEROBIC Blood Culture adequate volume   Culture   Final    NO GROWTH 5 DAYS Performed at [facility], [address] [place], [place] [address]    Report Status 11/16/2022 FINAL  Final  MRSA Next Gen by PCR, Nasal     Status: None   Collection Time: 11/12/22  1:46 PM   Specimen: Nasal Mucosa; Nasal Swab  Result Value Ref Range Status   MRSA by PCR Next Gen NOT DETECTED NOT DETECTED Final    Comment: (NOTE) The GeneXpert MRSA Assay (FDA approved for NASAL specimens only), is one component of a comprehensive MRSA colonization surveillance program. It is not intended to diagnose MRSA infection nor to guide or monitor treatment for MRSA infections. Test performance is not FDA approved in  patients less than 54 years old. Performed at [facility], [address] [place], [place] [address]      Labs: BNP (last 3 results) Recent Labs    11/11/22 0429  BNP 88.3   Basic Metabolic Panel: Recent Labs  Lab 11/15/22 0341 11/17/22 0557 11/18/22 0654 11/19/22 0525 11/20/22 0525  NA 144 141 138 134* 137  K 3.6 2.8* 3.7 4.8 4.0  CL 114* 108 101 93* 102  CO2 '25 27 29 30 26  '$ GLUCOSE 147* 110* 114* 125* 120*  BUN '18 15 12 15 13  '$ CREATININE 0.69 0.82 0.76 0.91 0.82  CALCIUM 8.1* 8.4* 8.8* 9.3 8.9   Liver Function Tests: No results for input(s): "AST", "ALT", "ALKPHOS", "BILITOT", "PROT", "ALBUMIN" in the last 168 hours. No results for input(s): "LIPASE", "AMYLASE" in the last 168 hours. No results for input(s): "AMMONIA" in the last 168 hours. CBC: Recent Labs  Lab 11/15/22 0341 11/19/22 0525 11/20/22 0525  WBC 10.4 10.2 8.2  HGB 10.1* 13.5 12.1  HCT 30.3* 41.4 37.2  MCV 96.5 95.8 96.6  PLT 251 241 208   Cardiac Enzymes: No results for input(s): "CKTOTAL", "CKMB", "CKMBINDEX", "TROPONINI" in the last 168 hours. BNP: Invalid input(s): "POCBNP" CBG: No results for input(s): "GLUCAP" in the last 168 hours. D-Dimer No results for input(s): "DDIMER" in the last 72 hours. Hgb A1c No results for input(s): "HGBA1C" in the last 72 hours. Lipid Profile No results for input(s): "CHOL", "HDL", "LDLCALC", "TRIG", "CHOLHDL", "LDLDIRECT" in the last 72 hours. Thyroid function studies No results for input(s): "TSH", "T4TOTAL", "T3FREE", "THYROIDAB" in the last  72 hours.  Invalid input(s): "FREET3" Anemia work up No results for input(s): "VITAMINB12", "FOLATE", "FERRITIN", "TIBC", "IRON", "RETICCTPCT" in the last 72 hours. Urinalysis    Component Value Date/Time   COLORURINE YELLOW (A) 06/28/2022 1212   APPEARANCEUR CLEAR (A) 06/28/2022 1212   LABSPEC 1.010 06/28/2022 1212   PHURINE 7.0 06/28/2022 1212   GLUCOSEU NEGATIVE 06/28/2022 1212   HGBUR  NEGATIVE 06/28/2022 1212   HGBUR negative 07/25/2007 [place] 06/28/2022 1212   KETONESUR NEGATIVE 06/28/2022 1212   PROTEINUR NEGATIVE 06/28/2022 1212   UROBILINOGEN 1.0 05/27/2009 1500   NITRITE NEGATIVE 06/28/2022 1212   LEUKOCYTESUR SMALL (A) 06/28/2022 1212   Sepsis Labs Recent Labs  Lab 11/15/22 0341 11/19/22 0525 11/20/22 0525  WBC 10.4 10.2 8.2   Microbiology Recent Results (from the past 240 hour(s))  Resp Panel by RT-PCR (Flu A&B, Covid) Anterior Nasal Swab     Status: None   Collection Time: 11/11/22  5:33 AM   Specimen: Anterior Nasal Swab  Result Value Ref Range Status   SARS Coronavirus 2 by RT PCR NEGATIVE NEGATIVE Final    Comment: (NOTE) SARS-CoV-2 target nucleic acids are NOT DETECTED.  The SARS-CoV-2 RNA is generally detectable in upper respiratory specimens during the acute phase of infection. The lowest concentration of SARS-CoV-2 viral copies this assay can detect is 138 copies/mL. A negative result does not preclude SARS-Cov-2 infection and should not be used as the sole basis for treatment or other patient management decisions. A negative result may occur with  improper specimen collection/handling, submission of specimen other than nasopharyngeal swab, presence of viral mutation(s) within the areas targeted by this assay, and inadequate number of viral copies(<138 copies/mL). A negative result must be combined with clinical observations, patient history, and epidemiological information. The expected result is Negative.  Fact Sheet for Patients:  EntrepreneurPulse.com.au  Fact Sheet for Healthcare Providers:  IncredibleEmployment.be  This test is no t yet approved or cleared by the [place] FDA and  has been authorized for detection and/or diagnosis of SARS-CoV-2 by FDA under an Emergency Use Authorization (EUA). This EUA will remain  in effect (meaning this test can be used) for the  duration of the COVID-19 declaration under Section 564(b)(1) of the Act, 21 U.S.C.section 360bbb-3(b)(1), unless the authorization is terminated  or revoked sooner.       Influenza A by PCR NEGATIVE NEGATIVE Final   Influenza B by PCR NEGATIVE NEGATIVE Final    Comment: (NOTE) The Xpert Xpress SARS-CoV-2/FLU/RSV plus assay is intended as an aid in the diagnosis of influenza from Nasopharyngeal swab specimens and should not be used as a sole basis for treatment. Nasal washings and aspirates are unacceptable for Xpert Xpress SARS-CoV-2/FLU/RSV testing.  Fact Sheet for Patients: EntrepreneurPulse.com.au  Fact Sheet for Healthcare Providers: IncredibleEmployment.be  This test is not yet approved or cleared by the [place] FDA and has been authorized for detection and/or diagnosis of SARS-CoV-2 by FDA under an Emergency Use Authorization (EUA). This EUA will remain in effect (meaning this test can be used) for the duration of the COVID-19 declaration under Section 564(b)(1) of the Act, 21 U.S.C. section 360bbb-3(b)(1), unless the authorization is terminated or revoked.  Performed at [facility], [place], [place] [address]   Respiratory (~20 pathogens) panel by PCR     Status: None   Collection Time: 11/11/22  5:33 AM   Specimen: Anterior Nasal Swab; Respiratory  Result Value Ref Range Status   Adenovirus  NOT DETECTED NOT DETECTED Final   Coronavirus 229E NOT DETECTED NOT DETECTED Final    Comment: (NOTE) The Coronavirus on the Respiratory Panel, DOES NOT test for the novel  Coronavirus (2019 nCoV)    Coronavirus HKU1 NOT DETECTED NOT DETECTED Final   Coronavirus NL63 NOT DETECTED NOT DETECTED Final   Coronavirus OC43 NOT DETECTED NOT DETECTED Final   Metapneumovirus NOT DETECTED NOT DETECTED Final   Rhinovirus / Enterovirus NOT DETECTED NOT DETECTED Final   Influenza A NOT DETECTED NOT DETECTED Final    Influenza B NOT DETECTED NOT DETECTED Final   Parainfluenza Virus 1 NOT DETECTED NOT DETECTED Final   Parainfluenza Virus 2 NOT DETECTED NOT DETECTED Final   Parainfluenza Virus 3 NOT DETECTED NOT DETECTED Final   Parainfluenza Virus 4 NOT DETECTED NOT DETECTED Final   Respiratory Syncytial Virus NOT DETECTED NOT DETECTED Final   Bordetella pertussis NOT DETECTED NOT DETECTED Final   Bordetella Parapertussis NOT DETECTED NOT DETECTED Final   Chlamydophila pneumoniae NOT DETECTED NOT DETECTED Final   Mycoplasma pneumoniae NOT DETECTED NOT DETECTED Final    Comment: Performed at [facility], [place] [address] [place], [place] [address]  Expectorated Sputum Assessment w Gram Stain, Rflx to Resp Cult     Status: None   Collection Time: 11/11/22  6:58 AM   Specimen: Sputum  Result Value Ref Range Status   Specimen Description SPUTUM  Final   Special Requests NONE  Final   Sputum evaluation   Final    THIS SPECIMEN IS ACCEPTABLE FOR SPUTUM CULTURE Performed at [facility], [address], [place], [place] [address]    Report Status 11/11/2022 FINAL  Final  Culture, Respiratory w Gram Stain     Status: None   Collection Time: 11/11/22  6:58 AM   Specimen: SPU  Result Value Ref Range Status   Specimen Description   Final    SPUTUM Performed at [facility], [address], [place], [place] [address]    Special Requests   Final    NONE Reflexed from [id number] Performed at [facility], [address] [place], [place] [address]    Gram Stain NO WBC SEEN FEW GRAM POSITIVE COCCI IN PAIRS   Final   Culture   Final    RARE STAPHYLOCOCCUS AUREUS WITHIN MIXED ORGANISMS Performed at [facility], [place] [address], [place], [place] [address]    Report Status 11/14/2022 FINAL  Final   Organism ID, Bacteria STAPHYLOCOCCUS AUREUS  Final      Susceptibility   Staphylococcus aureus - MIC*    CIPROFLOXACIN <=0.5 SENSITIVE Sensitive     ERYTHROMYCIN <=0.25  SENSITIVE Sensitive     GENTAMICIN <=0.5 SENSITIVE Sensitive     OXACILLIN 0.5 SENSITIVE Sensitive     TETRACYCLINE <=1 SENSITIVE Sensitive     VANCOMYCIN <=0.5 SENSITIVE Sensitive     TRIMETH/SULFA <=10 SENSITIVE Sensitive     CLINDAMYCIN <=0.25 SENSITIVE Sensitive     RIFAMPIN <=0.5 SENSITIVE Sensitive     Inducible Clindamycin NEGATIVE Sensitive     * RARE STAPHYLOCOCCUS AUREUS  Culture, blood (x 2)     Status: None   Collection Time: 11/11/22  8:09 AM   Specimen: BLOOD RIGHT ARM  Result Value Ref Range Status   Specimen Description BLOOD RIGHT ARM  Final   Special Requests   Final    BOTTLES DRAWN AEROBIC AND ANAEROBIC Blood Culture adequate volume   Culture   Final  NO GROWTH 5 DAYS Performed at [facility], [address] [place], [place] [address]    Report Status 11/16/2022 FINAL  Final  Culture, blood (x 2)     Status: None   Collection Time: 11/11/22  9:15 AM   Specimen: BLOOD RIGHT FOREARM  Result Value Ref Range Status   Specimen Description BLOOD RIGHT FOREARM  Final   Special Requests   Final    BOTTLES DRAWN AEROBIC AND ANAEROBIC Blood Culture adequate volume   Culture   Final    NO GROWTH 5 DAYS Performed at [facility], [address], [place], [place] [address]    Report Status 11/16/2022 FINAL  Final  MRSA Next Gen by PCR, Nasal     Status: None   Collection Time: 11/12/22  1:46 PM   Specimen: Nasal Mucosa; Nasal Swab  Result Value Ref Range Status   MRSA by PCR Next Gen NOT DETECTED NOT DETECTED Final    Comment: (NOTE) The GeneXpert MRSA Assay (FDA approved for NASAL specimens only), is one component of a comprehensive MRSA colonization surveillance program. It is not intended to diagnose MRSA infection nor to guide or monitor treatment for MRSA infections. Test performance is not FDA approved in patients less than 62 years old. Performed at [facility], [address], [place], [place] [address]      Time  coordinating discharge: Over 30 minutes  SIGNED:   Mckinley Jewel, MD  [facility] 11/20/2022, 1:05 PM Pager   If 7PM-7AM, please contact night-coverage [email or web address]

## 2022-12-08 ENCOUNTER — Emergency Department
Admission: EM | Admit: 2022-12-08 | Discharge: 2022-12-09 | Disposition: A | Discharge status: Home / Self Care | Payer: BC Managed Care – PPO | Attending: Emergency Medicine | Admitting: Emergency Medicine

## 2022-12-08 ENCOUNTER — Emergency Department: Payer: BC Managed Care – PPO

## 2022-12-08 ENCOUNTER — Other Ambulatory Visit: Payer: Self-pay

## 2022-12-08 DIAGNOSIS — R0602 Shortness of breath: Secondary | ICD-10-CM | POA: Insufficient documentation

## 2022-12-08 DIAGNOSIS — Z1152 Encounter for screening for COVID-19: Secondary | ICD-10-CM | POA: Insufficient documentation

## 2022-12-08 DIAGNOSIS — R051 Acute cough: Secondary | ICD-10-CM | POA: Diagnosis not present

## 2022-12-08 LAB — CBC
HCT: 40.6 % (ref 36.0–46.0)
Hemoglobin: 13.5 g/dL (ref 12.0–15.0)
MCH: 31.7 pg (ref 26.0–34.0)
MCHC: 33.3 g/dL (ref 30.0–36.0)
MCV: 95.3 fL (ref 80.0–100.0)
Platelets: 406 10*3/uL — ABNORMAL HIGH (ref 150–400)
RBC: 4.26 MIL/uL (ref 3.87–5.11)
RDW: 12.5 % (ref 11.5–15.5)
WBC: 8.9 10*3/uL (ref 4.0–10.5)
nRBC: 0 % (ref 0.0–0.2)

## 2022-12-08 LAB — RESP PANEL BY RT-PCR (RSV, FLU A&B, COVID)  RVPGX2
Influenza A by PCR: NEGATIVE
Influenza B by PCR: NEGATIVE
Resp Syncytial Virus by PCR: NEGATIVE
SARS Coronavirus 2 by RT PCR: NEGATIVE

## 2022-12-08 LAB — BASIC METABOLIC PANEL
Anion gap: 8 (ref 5–15)
BUN: <5 mg/dL — ABNORMAL LOW (ref 6–20)
CO2: 26 mmol/L (ref 22–32)
Calcium: 9.5 mg/dL (ref 8.9–10.3)
Chloride: 106 mmol/L (ref 98–111)
Creatinine, Ser: 0.77 mg/dL (ref 0.44–1.00)
GFR, Estimated: >60 mL/min (ref >60)
Glucose, Bld: 96 mg/dL (ref 70–99)
Potassium: 3.8 mmol/L (ref 3.5–5.1)
Sodium: 140 mmol/L (ref 135–145)

## 2022-12-08 LAB — TROPONIN I (HIGH SENSITIVITY): Troponin I (High Sensitivity): <2 ng/L (ref <18)

## 2022-12-08 NOTE — ED Notes (Signed)
Unable to collect blood work at this time, attempted 2x with no success. Lab called to collect

## 2022-12-08 NOTE — ED Triage Notes (Addendum)
Pt comes from home via POV c/o SOB. Pt states she has been having more trouble breathing yesterday and today. Pt used breathing treatment for wheezing she was having this morning but did not seem to help much. Pt was admitted last month bronchitis pneumonia. Pt endorses chest pain and upper back pain. Pt in NAD.

## 2022-12-08 NOTE — ED Provider Notes (Signed)
[facility] Provider Note    Event Date/Time   First MD Initiated Contact with Patient 12/08/22 2355     (approximate)   History   Shortness of Breath   HPI  [name] Horn is a 46 y.o. female who presents to the ED for evaluation of Shortness of Breath   I reviewed medical DC summary from 11/26.  Was admitted for 10 days due to tracheobronchomalacia.  Inhaled corticosteroids.  Waiting for a pulmonary referral.  Reports a couple days of worsening dyspnea, dyspnea on exertion and nonproductive cough.  Reports pain to her thoracic back that is atraumatic, pleuritic and not reproducible with palpation.  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  No improvement of her symptoms with breathing treatments.  No fevers, syncope   Physical Exam   Triage Vital Signs: ED Triage Vitals  Enc Vitals Group     BP 12/08/22 2003 (!) 130/93     Pulse Rate 12/08/22 2001 78     Resp 12/08/22 2001 17     Temp 12/08/22 2001 98.5 F (36.9 C)     Temp Source 12/08/22 2001 Oral     SpO2 12/08/22 2315 100 %     Weight 12/08/22 2002 180 lb (81.6 kg)     Height 12/08/22 2002 '5\' 4"'$  (1.626 m)     Head Circumference --      Peak Flow --      Pain Score 12/08/22 2002 7     Pain Loc --      Pain Edu? --      Excl. in [place]? --     Most recent vital signs: Vitals:   12/09/22 0415 12/09/22 0436  BP:    Pulse: (!) 116   Resp: (!) 22 19  Temp:    SpO2: 96% 100%    General: Awake, no distress.  Sitting upright, seems uncomfortable.  Tachypneic to nearly 30 just sitting there without conversation. CV:  Good peripheral perfusion.  Borderline tachycardia Resp:  Tachypnea with clear lungs.  No wheezing. Abd:  No distention.  MSK:  No deformity noted.  No signs of trauma to the back, skin changes or tenderness. Neuro:  No focal deficits appreciated. Other:     ED Results / Procedures / Treatments   Labs (all  labs ordered are listed, but only abnormal results are displayed) Labs Reviewed  BASIC METABOLIC PANEL - Abnormal; Notable for the following components:      Result Value   BUN <5 (*)    All other components within normal limits  CBC - Abnormal; Notable for the following components:   Platelets 406 (*)    All other components within normal limits  RESP PANEL BY RT-PCR (RSV, FLU A&B, COVID)  RVPGX2  BRAIN NATRIURETIC PEPTIDE  POC URINE PREG, ED  TROPONIN I (HIGH SENSITIVITY)  TROPONIN I (HIGH SENSITIVITY)    EKG Sinus rhythm with a rate of 99 bpm.  Normal axis and intervals.  No presents for acute ischemia.  RADIOLOGY CXR interpreted by me without evidence of acute cardiopulmonary pathology.  Official radiology report(s): CT Angio Chest PE W and/or Wo Contrast  Result Date: 12/09/2022 CLINICAL DATA:  History of pulmonary embolism, back pain and dyspnea at rest EXAM: CT ANGIOGRAPHY CHEST WITH CONTRAST TECHNIQUE: Multidetector CT imaging of the chest was performed using the standard protocol during bolus administration of intravenous contrast. Multiplanar CT image reconstructions  and MIPs were obtained to evaluate the vascular anatomy. RADIATION DOSE REDUCTION: This exam was performed according to the departmental dose-optimization program which includes automated exposure control, adjustment of the mA and/or kV according to patient size and/or use of iterative reconstruction technique. CONTRAST:  29m OMNIPAQUE IOHEXOL 350 MG/ML SOLN COMPARISON:  11/11/2022 FINDINGS: Cardiovascular: Satisfactory opacification of the pulmonary arteries to the segmental level. No evidence of pulmonary embolism. Normal heart size. No pericardial effusion. Mediastinum/Nodes: Negative for adenopathy or mass Lungs/Pleura: Lungs are clear. No pleural effusion or pneumothorax. Upper Abdomen: No acute finding.  Cholecystectomy Musculoskeletal: Subcutaneous cardiac device over the left chest. No osseous finding of  note. Review of the MIP images confirms the above findings. IMPRESSION: Negative chest CTA. Electronically Signed   By: JJorje GuildM.D.   On: 12/09/2022 04:04   [place]Venous Img Lower Bilateral  Result Date: 12/09/2022 CLINICAL DATA:  History of DVT with increasing pain on the left, initial encounter EXAM: BILATERAL LOWER EXTREMITY VENOUS DOPPLER ULTRASOUND TECHNIQUE: Gray-scale sonography with graded compression, as well as color Doppler and duplex ultrasound were performed to evaluate the lower extremity deep venous systems from the level of the common femoral vein and including the common femoral, femoral, profunda femoral, popliteal and calf veins including the posterior tibial, peroneal and gastrocnemius veins when visible. The superficial great saphenous vein was also interrogated. Spectral Doppler was utilized to evaluate flow at rest and with distal augmentation maneuvers in the common femoral, femoral and popliteal veins. COMPARISON:  None Available. FINDINGS: RIGHT LOWER EXTREMITY Common Femoral Vein: No evidence of thrombus. Normal compressibility, respiratory phasicity and response to augmentation. Saphenofemoral Junction: No evidence of thrombus. Normal compressibility and flow on color Doppler imaging. Profunda Femoral Vein: No evidence of thrombus. Normal compressibility and flow on color Doppler imaging. Femoral Vein: No evidence of thrombus. Normal compressibility, respiratory phasicity and response to augmentation. Popliteal Vein: No evidence of thrombus. Normal compressibility, respiratory phasicity and response to augmentation. Calf Veins: No evidence of thrombus. Normal compressibility and flow on color Doppler imaging. Superficial Great Saphenous Vein: No evidence of thrombus. Normal compressibility. Venous Reflux:  None. Other Findings:  None. LEFT LOWER EXTREMITY Common Femoral Vein: No evidence of thrombus. Normal compressibility, respiratory phasicity and response to augmentation.  Saphenofemoral Junction: No evidence of thrombus. Normal compressibility and flow on color Doppler imaging. Profunda Femoral Vein: No evidence of thrombus. Normal compressibility and flow on color Doppler imaging. Femoral Vein: No evidence of thrombus. Normal compressibility, respiratory phasicity and response to augmentation. Popliteal Vein: No evidence of thrombus. Normal compressibility, respiratory phasicity and response to augmentation. Calf Veins: No evidence of thrombus. Normal compressibility and flow on color Doppler imaging. Superficial Great Saphenous Vein: No evidence of thrombus. Normal compressibility. Venous Reflux:  None. Other Findings:  None. IMPRESSION: No evidence of deep venous thrombosis in either lower extremity. Electronically Signed   By: MInez CatalinaM.D.   On: 12/09/2022 02:15   DG Chest 2 View  Result Date: 12/08/2022 CLINICAL DATA:  Chest pain and shortness of breath, initial encounter EXAM: CHEST - 2 VIEW COMPARISON:  11/16/2022 CT FINDINGS: Cardiac shadow is within normal limits. Loop recorder is noted on the left. Lungs are clear bilaterally. No focal infiltrate or effusion is seen. No bony abnormality is noted. IMPRESSION: No active cardiopulmonary disease. Electronically Signed   By: MInez CatalinaM.D.   On: 12/08/2022 20:31    PROCEDURES and INTERVENTIONS:  .1-3 Lead EKG Interpretation  Performed by: SVladimir Crofts MD Authorized by: STamala Julian  Camillia Herter, MD     Interpretation: abnormal     ECG rate:  106   ECG rate assessment: tachycardic     Rhythm: sinus tachycardia     Ectopy: none     Conduction: normal     Medications  ipratropium-albuterol (DUONEB) 0.5-2.5 (3) MG/3ML nebulizer solution 6 mL (6 mLs Nebulization Given 12/09/22 0219)  predniSONE (DELTASONE) tablet 60 mg (60 mg Oral Given 12/09/22 0218)  HYDROmorphone (DILAUDID) injection 1 mg (1 mg Intravenous Given 12/09/22 0119)  furosemide (LASIX) injection 60 mg (60 mg Intravenous Given 12/09/22 0221)   iohexol (OMNIPAQUE) 350 MG/ML injection 75 mL (75 mLs Intravenous Contrast Given 12/09/22 0242)     IMPRESSION / MDM / ASSESSMENT AND PLAN / ED COURSE  I reviewed the triage vital signs and the nursing notes.  Differential diagnosis includes, but is not limited to, ACS, PTX, PNA, muscle strain/spasm, PE, dissection, COPD exacerbation  {Patient presents with symptoms of an acute illness or injury that is potentially life-threatening.  46 year old female with recent medical admission for tracheobronchomalacia presents with recurrence of similar symptoms, with a quite benign workup of a possible viral etiology, but ultimately suitable for outpatient management.  Presents tachypneic, but minimal wheezing on exam.  Intermittently tachycardic.  Blood work is generally reassuring with 2 negative troponins, negative BNP, normal metabolic panel.  CBC without leukocytosis or signs of sepsis.  Tested negative for flu, COVID and RSV.  Due to her VTE history, ultrasounds and CTA obtained without evidence of acute DVT or PE, respectively.  Feeling better after some breathing treatments and steroids.  Ultimately her clinical picture does sound like a bronchitis more than anything.  She remains with good saturations on room air and while I considered medical admission for this patient I think outpatient referral to pulmonology would be reasonable.  We discussed close return precautions.  Clinical Course as of 12/09/22 0443  Fri Dec 09, 2022  0437 reassessed [DS]    Clinical Course User Index [DS] Vladimir Crofts, MD     FINAL CLINICAL IMPRESSION(S) / ED DIAGNOSES   Final diagnoses:  Shortness of breath  Acute cough     Rx / DC Orders   ED Discharge Orders          Ordered    predniSONE (DELTASONE) 50 MG tablet  Daily        12/09/22 0438             Note:  This document was prepared using Dragon voice recognition software and may include unintentional dictation errors.   Vladimir Crofts,  MD 12/09/22 [phone]

## 2022-12-08 NOTE — ED Provider Notes (Incomplete)
[facility] Provider Note    Event Date/Time   First MD Initiated Contact with Patient 12/08/22 2355     (approximate)   History   Shortness of Breath   HPI  [name] Horn is a 46 y.o. female who presents to the ED for evaluation of Shortness of Breath   I reviewed medical DC summary from 11/26.  Was admitted for 10 days due to   Physical Exam   Triage Vital Signs: ED Triage Vitals  Enc Vitals Group     BP 12/08/22 2003 (!) 130/93     Pulse Rate 12/08/22 2001 78     Resp 12/08/22 2001 17     Temp 12/08/22 2001 98.5 F (36.9 C)     Temp Source 12/08/22 2001 Oral     SpO2 12/08/22 2315 100 %     Weight 12/08/22 2002 180 lb (81.6 kg)     Height 12/08/22 2002 '5\' 4"'$  (1.626 m)     Head Circumference --      Peak Flow --      Pain Score 12/08/22 2002 7     Pain Loc --      Pain Edu? --      Excl. in [place]? --     Most recent vital signs: Vitals:   12/08/22 2003 12/08/22 2315  BP: (!) 130/93 113/81  Pulse:  99  Resp:  19  Temp:  98.2 F (36.8 C)  SpO2:  100%    General: Awake, no distress. *** CV:  Good peripheral perfusion.  Resp:  Normal effort.  Abd:  No distention.  MSK:  No deformity noted.  Neuro:  No focal deficits appreciated. Other:     ED Results / Procedures / Treatments   Labs (all labs ordered are listed, but only abnormal results are displayed) Labs Reviewed  BASIC METABOLIC PANEL - Abnormal; Notable for the following components:      Result Value   BUN <5 (*)    All other components within normal limits  CBC - Abnormal; Notable for the following components:   Platelets 406 (*)    All other components within normal limits  RESP PANEL BY RT-PCR (RSV, FLU A&B, COVID)  [id number]  POC URINE PREG, ED  TROPONIN I (HIGH SENSITIVITY)  TROPONIN I (HIGH SENSITIVITY)    EKG ***  RADIOLOGY ***  Official radiology report(s): DG Chest 2 View  Result Date: 12/08/2022 CLINICAL DATA:  Chest pain and shortness of  breath, initial encounter EXAM: CHEST - 2 VIEW COMPARISON:  11/16/2022 CT FINDINGS: Cardiac shadow is within normal limits. Loop recorder is noted on the left. Lungs are clear bilaterally. No focal infiltrate or effusion is seen. No bony abnormality is noted. IMPRESSION: No active cardiopulmonary disease. Electronically Signed   By: Inez Catalina M.D.   On: 12/08/2022 20:31    PROCEDURES and INTERVENTIONS:  Procedures  Medications - No data to display   IMPRESSION / MDM / [place] / ED COURSE  I reviewed the triage vital signs and the nursing notes.  Differential diagnosis includes, but is not limited to, ***  {Patient presents with symptoms of an acute illness or injury that is potentially life-threatening.}      FINAL CLINICAL IMPRESSION(S) / ED DIAGNOSES   Final diagnoses:  None     Rx / DC Orders   ED Discharge Orders     None        Note:  This document was  prepared using Systems analyst and may include unintentional dictation errors.

## 2022-12-09 ENCOUNTER — Emergency Department: Payer: BC Managed Care – PPO

## 2022-12-09 LAB — BRAIN NATRIURETIC PEPTIDE: B Natriuretic Peptide: 14.3 pg/mL (ref 0.0–100.0)

## 2022-12-09 LAB — TROPONIN I (HIGH SENSITIVITY): Troponin I (High Sensitivity): <2 ng/L (ref <18)

## 2022-12-09 MED ORDER — IPRATROPIUM-ALBUTEROL 0.5-2.5 (3) MG/3ML IN SOLN
6.0000 mL | Freq: Once | RESPIRATORY_TRACT | Status: AC
  Administered 2022-12-09: 6 mL via RESPIRATORY_TRACT
  Filled 2022-12-09: qty 6

## 2022-12-09 MED ORDER — PREDNISONE 20 MG PO TABS
60.0000 mg | ORAL_TABLET | Freq: Once | ORAL | Status: AC
  Administered 2022-12-09: 60 mg via ORAL
  Filled 2022-12-09: qty 3

## 2022-12-09 MED ORDER — HYDROMORPHONE HCL 1 MG/ML IJ SOLN
1.0000 mg | Freq: Once | INTRAMUSCULAR | Status: AC
  Administered 2022-12-09: 1 mg via INTRAVENOUS
  Filled 2022-12-09: qty 1

## 2022-12-09 MED ORDER — IOHEXOL 350 MG/ML SOLN
75.0000 mL | Freq: Once | INTRAVENOUS | Status: AC | PRN
  Administered 2022-12-09: 75 mL via INTRAVENOUS

## 2022-12-09 MED ORDER — PREDNISONE 50 MG PO TABS: 50.0000 mg | ORAL_TABLET | Freq: Every day | ORAL | 0 refills | Status: AC

## 2022-12-09 MED ORDER — FUROSEMIDE 10 MG/ML IJ SOLN
60.0000 mg | Freq: Once | INTRAMUSCULAR | Status: AC
  Administered 2022-12-09: 60 mg via INTRAVENOUS
  Filled 2022-12-09: qty 8

## 2022-12-09 NOTE — ED Notes (Signed)
[place] at bedside will return to administer remaining medications

## 2023-01-08 IMAGING — CR DG WRIST COMPLETE 3+V*L*
4 series · 4 of 4 positions shown · non-contrast
Comparison: 10/23/2019

CLINICAL DATA: Fall with left wrist pain.

EXAM:
LEFT WRIST - COMPLETE 4 VIEW

[wrist pa]
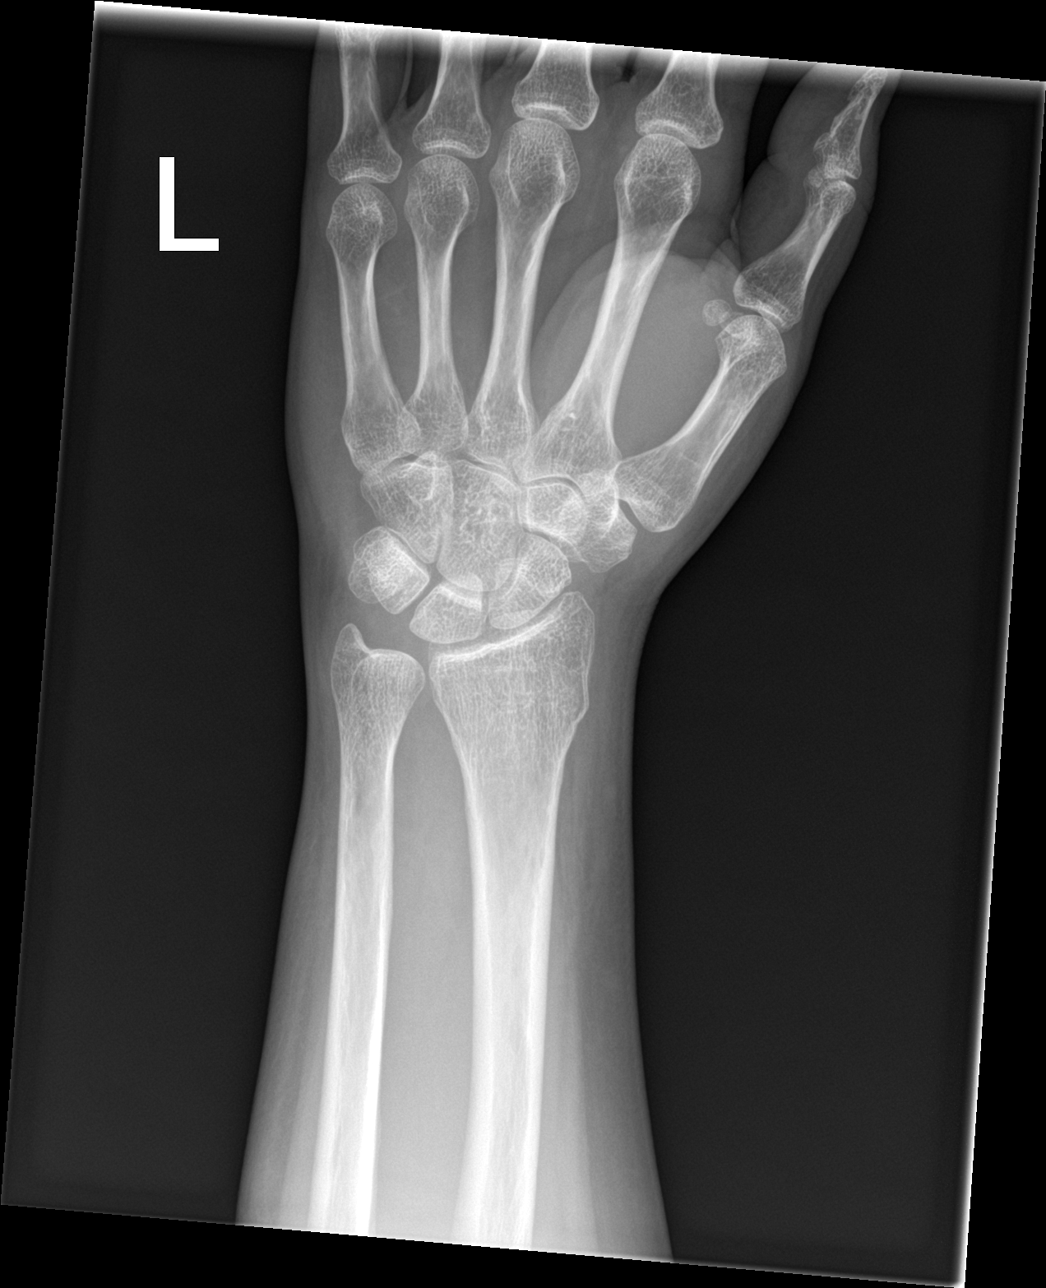

[wrist obl]
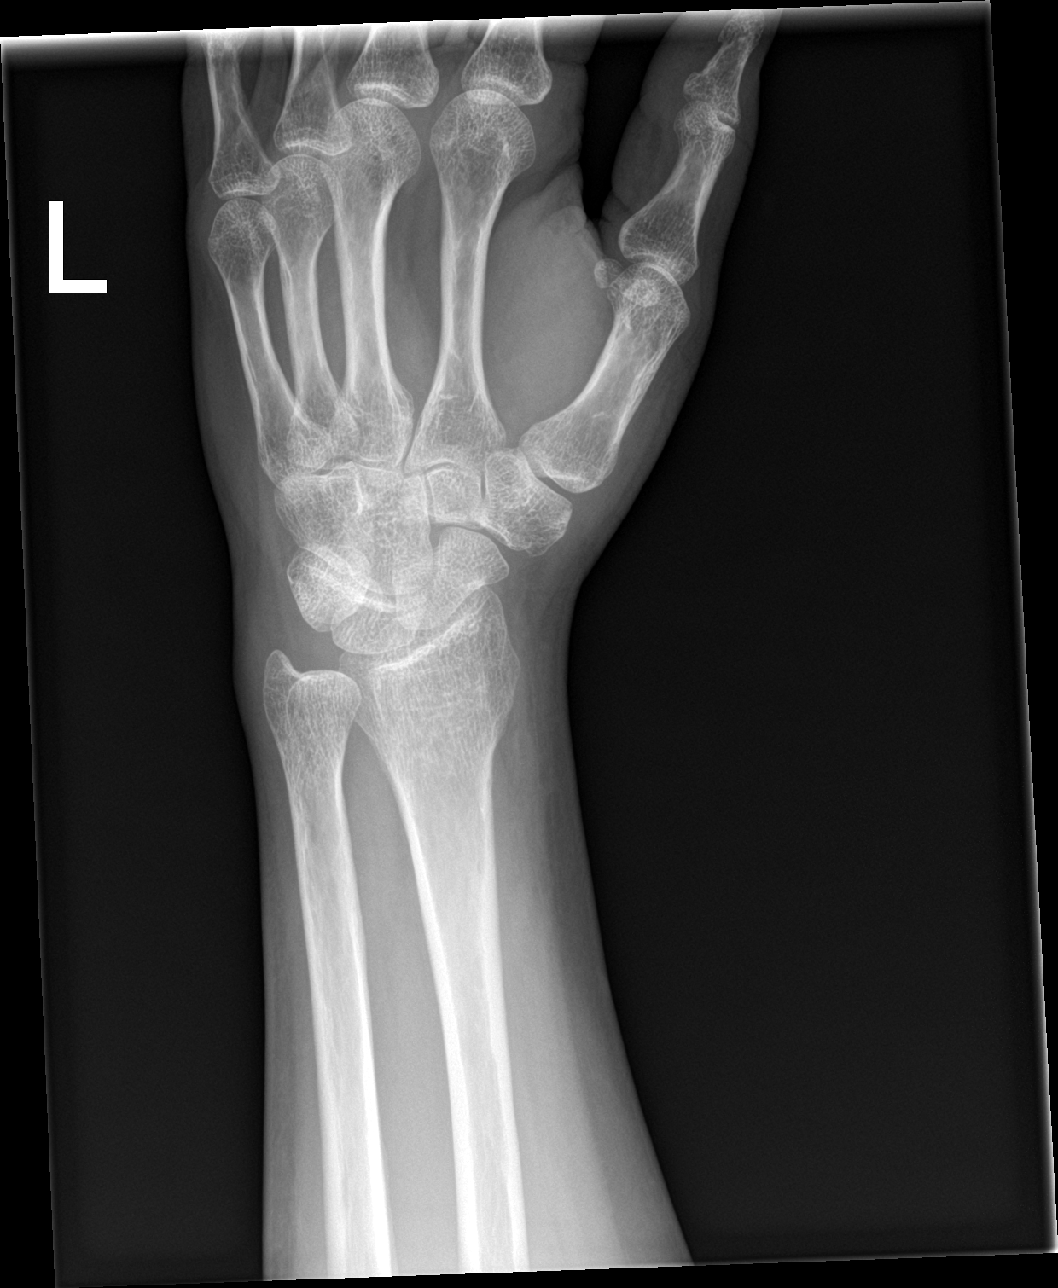

[wrist lat]
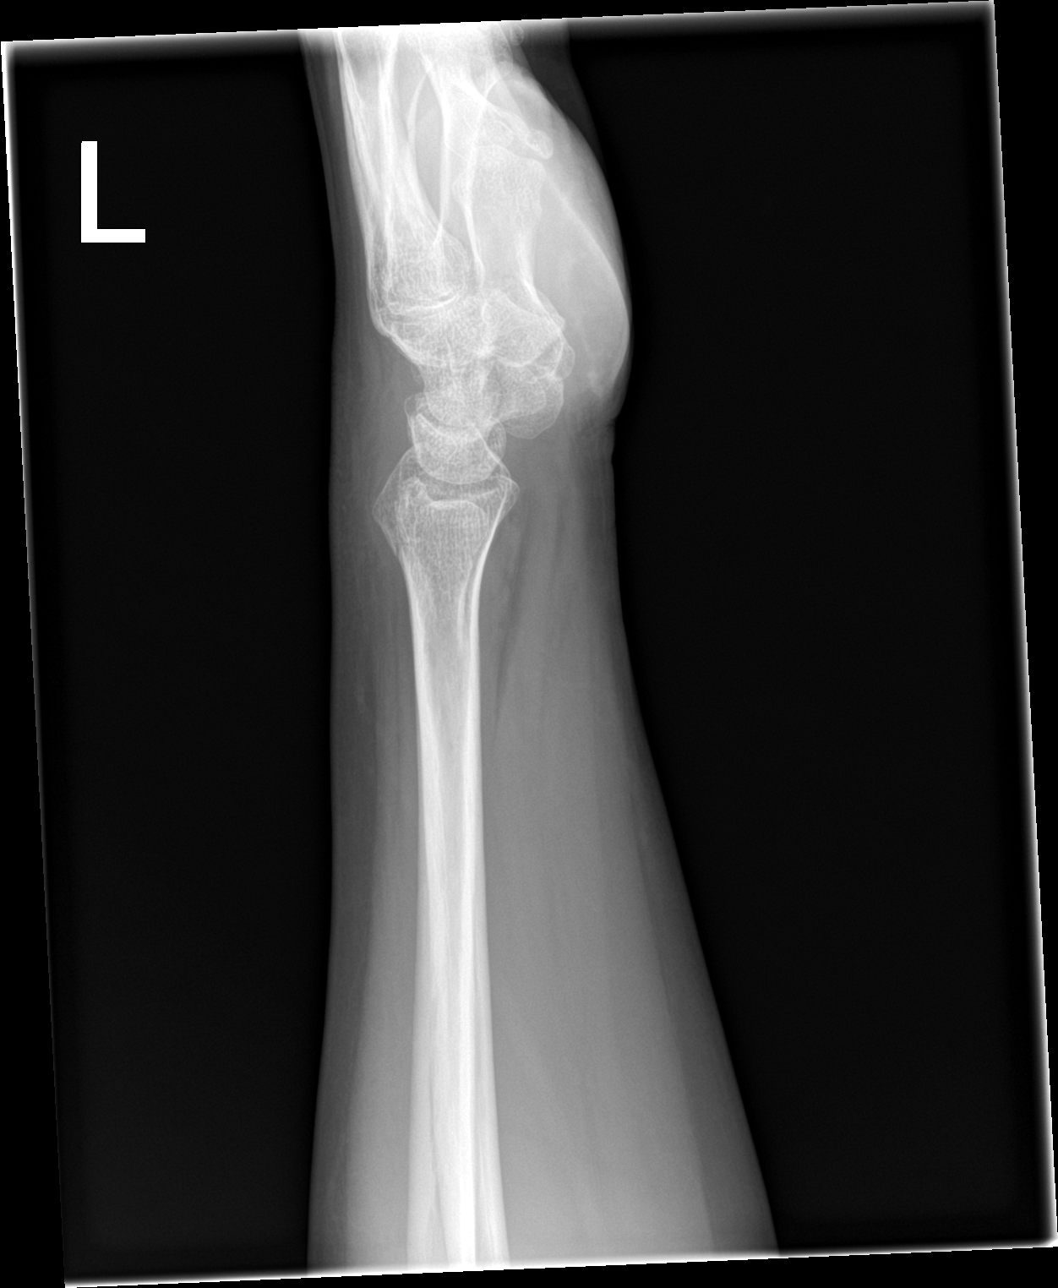

[wrist navicular]
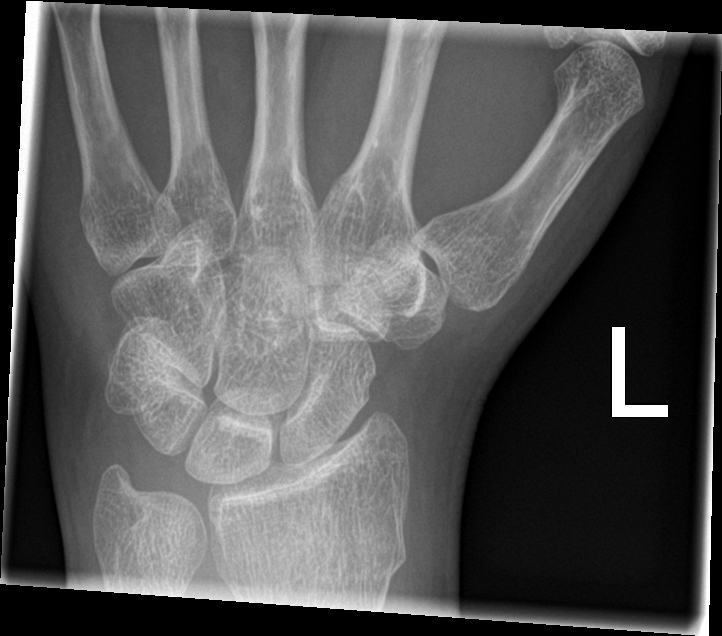

[4 of 4 positions shown; findings below may reference images not displayed]

FINDINGS: Subtle transverse fracture of the distal radius, across the
metaphysis. No fracture comminution. No angulation of the distal
radial articular surface.

No other fractures.  Wrist joints are normally spaced and aligned.

Mild dorsal soft tissue swelling.
IMPRESSION: 1. Subtle nondisplaced, non comminuted transverse fracture of the
distal left radial metaphysis.

## 2023-01-17 MED ORDER — EMPTY CONTAINER
3 refills | 0 days
Start: 2023-01-17 — End: ?

## 2023-01-17 NOTE — Unmapped (Signed)
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  I am speaking to the patient.    Was a Nurse, learning disability used for this call? No    Verified patient's date of birth / HIPAA.    Specialty medication(s) to be sent: Inflammatory Disorders: Dupixent      Non-specialty medications/supplies to be sent: sharps      Medications not needed at this time: n/a         Dupixent (dupilumab)    The patient declined counseling on medication administration, missed dose instructions, goals of therapy, side effects and monitoring parameters, warnings and precautions, drug/food interactions, and storage, handling precautions, and disposal because they have taken the medication previously. The information in the declined sections below are for informational purposes only and was not discussed with patient.       Medication & Administration     Dosage: Eosinophilic esophagitis: Adults and children >/=12 years, weighing >/=40 kg: Inject 300 mg under the skin every 7 days    Administration:     Dupixent Syringe  1. Gather all supplies needed for injection on a clean, flat working surface: medication syringe removed from packaging, alcohol swab, sharps container, etc.  2. Look at the medication label - look for correct medication, correct dose, and check the expiration date  3. Look at the medication - the liquid in the syringe should appear clear and colorless to pale yellow  4. Lay the syringe on a flat surface and allow it to warm up to room temperature for at least 45 minutes  5. Select injection site - you can use the front of your thigh or your belly (but not the area 2 inches around your belly button); if someone else is giving you the injection you can also use your upper arm in the skin covering your triceps muscle  6. Prepare injection site - wash your hands and clean the skin at the injection site with an alcohol swab and let it air dry, do not touch the injection site again before the injection  7. Hold the middle of the body of the syringe and gently pull the needle safety cap straight out. Be careful not to bend the needle. Do not remove until immediately prior to injection  8. Pinch the skin - with your hand not holding the syringe pinch up a fold of skin at the injection site using your forefinger and thumb  9. Insert the needle into the fold of skin at about a 45 degree angle - it's best to use a quick dart-like motion - with the syringe in position, release the pinch of skin and allow the skin to relax  10. Push the plunger down slowly as far as it will go until the syringe is empty, if the plunger is not fully depressed the needle shield will not extend to cover the needle when it is removed  11. Check that the syringe is empty and keep pressing down on the plunger while you pull the needle out at the same angle as inserted; after the needle is removed completely from the skin, release the plunger allowing the needle shield to activate and cover the used needle  12. Dispose of the used syringe immediately in your Teacher, music  13. If you see any blood at the injection site, press a cotton ball or gauze on the site and maintain pressure until the bleeding stops, do not rub the injection site    Adherence/Missed dose instructions:  If a dose is missed, administer within 7 days from the missed dose and then resume the original schedule. If the missed dose is not administered within 7 days, you can either wait until the next dose on the original schedule or take your dose now and resume every 14 days from the new injection date. Do not use 2 doses at the same time or extra doses.      Goals of Therapy     Reduce symptoms of dysphagia , Decrease in esophageal inflammation , and Disease remission     Side Effects & Monitoring Parameters     Injection site reaction (redness, irritation, inflammation localized to the site of administration)  Signs of a common cold - minor sore throat, runny or stuffy nose, etc.  Recurrence of cold sores (herpes simplex)      The following side effects should be reported to the provider:  Signs of a hypersensitivity reaction - rash; hives; itching; red, swollen, blistered, or peeling skin; wheezing; tightness in the chest or throat; difficulty breathing, swallowing, or talking; swelling of the mouth, face, lips, tongue, or throat; etc.  Eye pain or irritation or any visual disturbances  Shortness of breath or worsening of breathing      Contraindications, Warnings, & Precautions     Have your bloodwork checked as you have been told by your prescriber   Birth control pills and other hormone-based birth control may not work as well to prevent pregnancy  Talk with your doctor if you are pregnant, planning to become pregnant, or breastfeeding  Discuss the possible need for holding your dose(s) of Dupixent?? when a planned procedure is scheduled with the prescriber as it may delay healing/recovery timeline       Drug/Food Interactions     Medication list reviewed in Epic. The patient was instructed to inform the care team before taking any new medications or supplements. No drug interactions identified.   Talk with you prescriber or pharmacist before receiving any live vaccinations while taking this medication and after you stop taking it    Storage, Handling Precautions, & Disposal     Store this medication in the refrigerator.  Do not freeze  If needed, you may store at room temperature for up to 14 days  Store in original packaging, protected from light  Do not shake  Dispose of used syringes in a sharps disposal container            Current Medications (including OTC/herbals), Comorbidities and Allergies     Current Outpatient Medications   Medication Sig Dispense Refill    AMPHETAMINE-DEXTROAMPHETAMINE 20 MG tablet Take 20 mg by mouth Three (3) times a day as needed.   0    cholestyramine (QUESTRAN) 4 gram packet Take 1 packet by mouth Three (3) times a day. 90 each 0    dibucaine (NUPERCAINAL) 1 % Oint Apply topically every two (2) hours as needed. (Patient taking differently: Apply 1 application topically every two (2) hours as needed. ) 1 Tube 0    dupilumab (DUPIXENT SYRINGE) 300 mg/2 mL Syrg injection Inject the contents of 1 syringe (300 mg total) under the skin every seven (7) days. 8 mL 6     No current facility-administered medications for this visit.  Allergies   Allergen Reactions    Sulfa (Sulfonamide Antibiotics) Shortness Of Breath     Rash, Swelling, angioedema    Diphenhydramine Other (See Comments)      HYPERACTIVITY, RESTLESS LEG       Patient Active Problem List   Diagnosis    Cervical radicular pain    Carpal tunnel syndrome of right wrist    Incisional hernia    Hypokalemia    Clostridium difficile colitis    Clostridium difficile diarrhea    Recurrent Clostridium difficile diarrhea    C. difficile colitis       Reviewed and up to date in Epic.    Appropriateness of Therapy     Acute infections noted within Epic:  No active infections  Patient reported infection: None    Is medication and dose appropriate based on diagnosis and infection status? Yes    Prescription has been clinically reviewed: Yes      Baseline Quality of Life Assessment      How many days over the past month did your EOE  keep you from your normal activities? For example, brushing your teeth or getting up in the morning. 0    Financial Information     Medication Assistance provided: Prior Authorization and Copay Assistance    Anticipated copay of $0 reviewed with patient. Verified delivery address.    Delivery Information     Scheduled delivery date: 1/26    Expected start date: 2/2    Medication will be delivered via UPS to the prescription address in [facility]  This shipment will not require a signature.      Explained the services we provide at [facility] and that each month we would call to set up refills.  Stressed importance of returning phone calls so that we could ensure they receive their medications in time each month.  Informed patient that we should be setting up refills 7-10 days prior to when they will run out of medication.  A pharmacist will reach out to perform a clinical assessment periodically.  Informed patient that a welcome packet, containing information about our pharmacy and other support services, a Notice of Privacy Practices, and a drug information handout will be sent.      The patient or caregiver noted above participated in the development of this care plan and knows that they can request review of or adjustments to the care plan at any time.      Patient or caregiver verbalized understanding of the above information as well as how to contact the pharmacy at [phone] option 4 with any questions/concerns.  The pharmacy is open Monday through Friday 8:30am-4:30pm.  A pharmacist is available 24/7 via pager to answer any clinical questions they may have.    Patient Specific Needs     Does the patient have any physical, cognitive, or cultural barriers? No    Does the patient have adequate living arrangements? (i.e. the ability to store and take their medication appropriately) Yes    Did you identify any home environmental safety or security hazards? No    Patient prefers to have medications discussed with  Patient     Is the patient or caregiver able to read and understand education materials at a high school level or above? No    Patient's primary language is  English     Is the patient high risk? No    SOCIAL DETERMINANTS OF HEALTH     At the  [facility], we have learned that life circumstances - like trouble affording food, housing, utilities, or transportation can affect the health of many of our patients.   That is why we wanted to ask: are you currently experiencing any life circumstances that are negatively impacting your health and/or quality of life? No    Social Determinants of Psychologist, prison and probation services Strain: Not on file   Internet Connectivity: Not on file   Food Insecurity: Not on file   Tobacco Use: Unknown (11/07/2021)    Patient History     Smoking Tobacco Use: Never     Smokeless Tobacco Use: Unknown     Passive Exposure: Not on file   Housing/Utilities: Not on file   Alcohol Use: Not on file   Transportation Needs: Not on file   Substance Use: Not on file   Health Literacy: Not on file   Physical Activity: Not on file   Interpersonal Safety: Not on file   Stress: Not on file   Intimate Partner Violence: Not on file   Depression: Not on file   Social Connections: Not on file       Would you be willing to receive help with any of the needs that you have identified today? Not applicable       [name], PharmD  [facility] Specialty Pharmacist

## 2023-01-19 MED FILL — DUPIXENT 300 MG/2 ML SUBCUTANEOUS SYRINGE: SUBCUTANEOUS | 28 days supply | Qty: 8 | Fill #0

## 2023-01-19 MED FILL — EMPTY CONTAINER: 120 days supply | Qty: 1 | Fill #0

## 2023-02-14 NOTE — Unmapped (Signed)
[facility] Clinical Assessment & Refill Coordination Note    Fridays - 1     Julia Contreras, DOB: 26-Mar-1976  Phone: [phone] (home)     All above HIPAA information was verified with patient.     Was a Nurse, learning disability used for this call? No    Specialty Medication(s):   Inflammatory Disorders: Dupixent     Current Outpatient Medications   Medication Sig Dispense Refill    AMPHETAMINE-DEXTROAMPHETAMINE 20 MG tablet Take 1 tablet (20 mg total) by mouth Three (3) times a day as needed.  0    dibucaine (NUPERCAINAL) 1 % Oint Apply topically every two (2) hours as needed. (Patient taking differently: Apply 1 application topically every two (2) hours as needed. ) 1 Tube 0    dupilumab (DUPIXENT SYRINGE) 300 mg/2 mL Syrg injection Inject the contents of 1 syringe (300 mg total) under the skin every seven (7) days. 8 mL 6    empty container Misc Use as directed to dispose of Dupixent 1 each 3     No current facility-administered medications for this visit.        Changes to medications: [name] reports no changes at this time.    Allergies   Allergen Reactions    Dermabond [2-Octyl Cyanoacrylate] Hives    Sulfa (Sulfonamide Antibiotics) Shortness Of Breath     Rash, Swelling, angioedema    Diphenhydramine Other (See Comments)      HYPERACTIVITY, RESTLESS LEG       Changes to allergies: No    SPECIALTY MEDICATION ADHERENCE     Dupixent 300  mg/58mL : 1 days of medicine on hand        Specialty medication(s) dose(s) confirmed: Regimen is correct and unchanged.     Are there any concerns with adherence? No    Adherence counseling provided? Not needed    CLINICAL MANAGEMENT AND INTERVENTION      Clinical Benefit Assessment:    Do you feel the medicine is effective or helping your condition? Yes    Clinical Benefit counseling provided? Not needed    Adverse Effects Assessment:    Are you experiencing any side effects? No    Are you experiencing difficulty administering your medicine? No    Quality of Life Assessment:    Quality of Life    Rheumatology  Oncology  Dermatology  Cystic Fibrosis          How many days over the past month did your EOE  keep you from your normal activities? For example, brushing your teeth or getting up in the morning. 0    Have you discussed this with your provider? Not needed    Acute Infection Status:    Acute infections noted within Epic:  No active infections  Patient reported infection: None    Therapy Appropriateness:    Is therapy appropriate and patient progressing towards therapeutic goals? Yes, therapy is appropriate and should be continued    DISEASE/MEDICATION-SPECIFIC INFORMATION      For patients on injectable medications: Patient currently has 1 doses left.  Next injection is scheduled for 2/23.    Chronic Inflammatory Diseases: Have you experienced any flares in the last month? No  Has this been reported to your provider? N/a    PATIENT SPECIFIC NEEDS     Does the patient have any physical, cognitive, or cultural barriers? No    Is the patient high risk? No    Did the patient require a  clinical intervention? No    Does the patient require physician intervention or other additional services (i.e., nutrition, smoking cessation, social work)? No    SOCIAL DETERMINANTS OF HEALTH     At the [facility], we have learned that life circumstances - like trouble affording food, housing, utilities, or transportation can affect the health of many of our patients.   That is why we wanted to ask: are you currently experiencing any life circumstances that are negatively impacting your health and/or quality of life? No    Social Determinants of Psychologist, prison and probation services Strain: Not on file   Internet Connectivity: Not on file   Food Insecurity: Not on file   Tobacco Use: Unknown (11/07/2021)    Patient History     Smoking Tobacco Use: Never     Smokeless Tobacco Use: Unknown     Passive Exposure: Not on file   Housing/Utilities: Not on file   Alcohol Use: Not on file   Transportation Needs: Not on file   Substance Use: Not on file   Health Literacy: Not on file   Physical Activity: Not on file   Interpersonal Safety: Not on file   Stress: Not on file   Intimate Partner Violence: Not on file   Depression: Not on file   Social Connections: Not on file       Would you be willing to receive help with any of the needs that you have identified today? Not applicable       SHIPPING     Specialty Medication(s) to be Shipped:   Inflammatory Disorders: Dupixent    Other medication(s) to be shipped: No additional medications requested for fill at this time     Changes to insurance: No    Patient was informed of new phone menu: Yes    Delivery Scheduled: Yes, Expected medication delivery date: 2/22.     Medication will be delivered via UPS to the confirmed prescription address in [facility].    The patient will receive a drug information handout for each medication shipped and additional FDA Medication Guides as required.  Verified that patient has previously received a Conservation officer, historic buildings and a Surveyor, mining.    The patient or caregiver noted above participated in the development of this care plan and knows that they can request review of or adjustments to the care plan at any time.      All of the patient's questions and concerns have been addressed.    [name], PharmD   [facility] Specialty Pharmacist

## 2023-02-15 MED FILL — DUPIXENT 300 MG/2 ML SUBCUTANEOUS SYRINGE: SUBCUTANEOUS | 28 days supply | Qty: 8 | Fill #1

## 2023-03-27 NOTE — Unmapped (Signed)
[facility] Refill Coordination Note    Specialty Medication(s) to be Shipped:   Inflammatory Disorders: Dupixent    Other medication(s) to be shipped: No additional medications requested for fill at this time     Julia Contreras, DOB: 07-Dec-1976  Phone: [phone] (home)       All above HIPAA information was verified with patient.     Was a Nurse, learning disability used for this call? No    Completed refill call assessment today to schedule patient's medication shipment from the [facility] [phone]).  All relevant notes have been reviewed.     Specialty medication(s) and dose(s) confirmed: Regimen is correct and unchanged.   Changes to medications: Milaya reports no changes at this time.  Changes to insurance: No  New side effects reported not previously addressed with a pharmacist or physician: None reported  Questions for the pharmacist: No    Confirmed patient received a Conservation officer, historic buildings and a Surveyor, mining with first shipment. The patient will receive a drug information handout for each medication shipped and additional FDA Medication Guides as required.       DISEASE/MEDICATION-SPECIFIC INFORMATION        For patients on injectable medications: Patient currently has 1 doses left.  Next injection is scheduled for 03/31/23.    SPECIALTY MEDICATION ADHERENCE     Medication Adherence    Patient reported X missed doses in the last month: 0  Specialty Medication: DUPIXENT SYRINGE 300 mg/2 mL Syrg injection (dupilumab)  Patient is on additional specialty medications: No  Patient is on more than two specialty medications: No  Any gaps in refill history greater than 2 weeks in the last 3 months: no  Demonstrates understanding of importance of adherence: yes              Were doses missed due to medication being on hold? No    DUPIXENT SYRINGE 300 mg/2 mL   : 14 days of medicine on hand       REFERRAL TO PHARMACIST     Referral to the pharmacist: Not needed      [facility] Shipping address confirmed in Epic.     Patient was notified of new phone menu : No    Delivery Scheduled: Yes, Expected medication delivery date: 04/05/23.     Medication will be delivered via UPS to the prescription address in Epic [facility].    Gaspar Cola [facility] Specialty Technician

## 2023-04-04 MED FILL — DUPIXENT 300 MG/2 ML SUBCUTANEOUS SYRINGE: SUBCUTANEOUS | 28 days supply | Qty: 8 | Fill #2

## 2023-05-01 ENCOUNTER — Emergency Department: Payer: BC Managed Care – PPO

## 2023-05-01 ENCOUNTER — Other Ambulatory Visit: Payer: Self-pay

## 2023-05-01 ENCOUNTER — Inpatient Hospital Stay
Admission: EM | Admit: 2023-05-01 | Discharge: 2023-05-06 | DRG: 871 | Disposition: A | Discharge status: Home / Self Care | Payer: BC Managed Care – PPO | Attending: Internal Medicine | Admitting: Internal Medicine

## 2023-05-01 ENCOUNTER — Encounter: Payer: Self-pay | Admitting: Emergency Medicine

## 2023-05-01 DIAGNOSIS — D806 Antibody deficiency with near-normal immunoglobulins or with hyperimmunoglobulinemia: Secondary | ICD-10-CM | POA: Diagnosis not present

## 2023-05-01 DIAGNOSIS — Z1152 Encounter for screening for COVID-19: Secondary | ICD-10-CM

## 2023-05-01 DIAGNOSIS — G030 Nonpyogenic meningitis: Secondary | ICD-10-CM | POA: Diagnosis present

## 2023-05-01 DIAGNOSIS — Z882 Allergy status to sulfonamides status: Secondary | ICD-10-CM

## 2023-05-01 DIAGNOSIS — F419 Anxiety disorder, unspecified: Secondary | ICD-10-CM | POA: Diagnosis present

## 2023-05-01 DIAGNOSIS — R6510 Systemic inflammatory response syndrome (SIRS) of non-infectious origin without acute organ dysfunction: Secondary | ICD-10-CM | POA: Diagnosis not present

## 2023-05-01 DIAGNOSIS — G43909 Migraine, unspecified, not intractable, without status migrainosus: Secondary | ICD-10-CM | POA: Diagnosis not present

## 2023-05-01 DIAGNOSIS — Z8249 Family history of ischemic heart disease and other diseases of the circulatory system: Secondary | ICD-10-CM | POA: Diagnosis not present

## 2023-05-01 DIAGNOSIS — D809 Immunodeficiency with predominantly antibody defects, unspecified: Secondary | ICD-10-CM | POA: Diagnosis not present

## 2023-05-01 DIAGNOSIS — Z6830 Body mass index (BMI) 30.0-30.9, adult: Secondary | ICD-10-CM | POA: Diagnosis not present

## 2023-05-01 DIAGNOSIS — G039 Meningitis, unspecified: Secondary | ICD-10-CM | POA: Diagnosis present

## 2023-05-01 DIAGNOSIS — Z86718 Personal history of other venous thrombosis and embolism: Secondary | ICD-10-CM

## 2023-05-01 DIAGNOSIS — M069 Rheumatoid arthritis, unspecified: Secondary | ICD-10-CM | POA: Diagnosis present

## 2023-05-01 DIAGNOSIS — Z7951 Long term (current) use of inhaled steroids: Secondary | ICD-10-CM

## 2023-05-01 DIAGNOSIS — E876 Hypokalemia: Secondary | ICD-10-CM | POA: Diagnosis present

## 2023-05-01 DIAGNOSIS — Z923 Personal history of irradiation: Secondary | ICD-10-CM

## 2023-05-01 DIAGNOSIS — T50905A Adverse effect of unspecified drugs, medicaments and biological substances, initial encounter: Secondary | ICD-10-CM | POA: Diagnosis present

## 2023-05-01 DIAGNOSIS — E669 Obesity, unspecified: Secondary | ICD-10-CM | POA: Diagnosis present

## 2023-05-01 DIAGNOSIS — K589 Irritable bowel syndrome without diarrhea: Secondary | ICD-10-CM | POA: Diagnosis present

## 2023-05-01 DIAGNOSIS — K3184 Gastroparesis: Secondary | ICD-10-CM | POA: Diagnosis present

## 2023-05-01 DIAGNOSIS — E785 Hyperlipidemia, unspecified: Secondary | ICD-10-CM | POA: Diagnosis present

## 2023-05-01 DIAGNOSIS — Z791 Long term (current) use of non-steroidal anti-inflammatories (NSAID): Secondary | ICD-10-CM

## 2023-05-01 DIAGNOSIS — R519 Headache, unspecified: Secondary | ICD-10-CM | POA: Diagnosis not present

## 2023-05-01 DIAGNOSIS — K219 Gastro-esophageal reflux disease without esophagitis: Secondary | ICD-10-CM | POA: Diagnosis present

## 2023-05-01 DIAGNOSIS — M059 Rheumatoid arthritis with rheumatoid factor, unspecified: Secondary | ICD-10-CM | POA: Diagnosis not present

## 2023-05-01 DIAGNOSIS — A419 Sepsis, unspecified organism: Principal | ICD-10-CM | POA: Diagnosis present

## 2023-05-01 DIAGNOSIS — Z8709 Personal history of other diseases of the respiratory system: Secondary | ICD-10-CM

## 2023-05-01 DIAGNOSIS — Z8616 Personal history of COVID-19: Secondary | ICD-10-CM | POA: Diagnosis not present

## 2023-05-01 DIAGNOSIS — Z79899 Other long term (current) drug therapy: Secondary | ICD-10-CM

## 2023-05-01 DIAGNOSIS — K200 Eosinophilic esophagitis: Secondary | ICD-10-CM | POA: Diagnosis present

## 2023-05-01 DIAGNOSIS — Z888 Allergy status to other drugs, medicaments and biological substances status: Secondary | ICD-10-CM

## 2023-05-01 DIAGNOSIS — G2581 Restless legs syndrome: Secondary | ICD-10-CM | POA: Diagnosis present

## 2023-05-01 DIAGNOSIS — D849 Immunodeficiency, unspecified: Secondary | ICD-10-CM | POA: Diagnosis present

## 2023-05-01 DIAGNOSIS — B370 Candidal stomatitis: Secondary | ICD-10-CM | POA: Diagnosis present

## 2023-05-01 HISTORY — DX: Hypokalemia: E87.6

## 2023-05-01 LAB — CBC WITH DIFFERENTIAL/PLATELET
Abs Immature Granulocytes: 0.01 10*3/uL (ref 0.00–0.07)
Basophils Absolute: 0 10*3/uL (ref 0.0–0.1)
Basophils Relative: 1 %
Eosinophils Absolute: 0.1 10*3/uL (ref 0.0–0.5)
Eosinophils Relative: 2 %
HCT: 38.5 % (ref 36.0–46.0)
Hemoglobin: 12.9 g/dL (ref 12.0–15.0)
Immature Granulocytes: 0 %
Lymphocytes Relative: 23 %
Lymphs Abs: 1.3 10*3/uL (ref 0.7–4.0)
MCH: 29.7 pg (ref 26.0–34.0)
MCHC: 33.5 g/dL (ref 30.0–36.0)
MCV: 88.7 fL (ref 80.0–100.0)
Monocytes Absolute: 0.4 10*3/uL (ref 0.1–1.0)
Monocytes Relative: 7 %
Neutro Abs: 3.8 10*3/uL (ref 1.7–7.7)
Neutrophils Relative %: 67 %
Platelets: 219 10*3/uL (ref 150–400)
RBC: 4.34 MIL/uL (ref 3.87–5.11)
RDW: 13.5 % (ref 11.5–15.5)
WBC: 5.7 10*3/uL (ref 4.0–10.5)
nRBC: 0 % (ref 0.0–0.2)

## 2023-05-01 LAB — MENINGITIS/ENCEPHALITIS PANEL (CSF)

## 2023-05-01 LAB — COMPREHENSIVE METABOLIC PANEL
ALT: 20 U/L (ref 0–44)
AST: 42 U/L — ABNORMAL HIGH (ref 15–41)
Albumin: 3.8 g/dL (ref 3.5–5.0)
Alkaline Phosphatase: 93 U/L (ref 38–126)
Anion gap: 8 (ref 5–15)
BUN: 6 mg/dL (ref 6–20)
CO2: 22 mmol/L (ref 22–32)
Calcium: 9.3 mg/dL (ref 8.9–10.3)
Chloride: 108 mmol/L (ref 98–111)
Creatinine, Ser: 0.9 mg/dL (ref 0.44–1.00)
GFR, Estimated: >60 mL/min (ref >60)
Glucose, Bld: 114 mg/dL — ABNORMAL HIGH (ref 70–99)
Potassium: 3.4 mmol/L — ABNORMAL LOW (ref 3.5–5.1)
Sodium: 138 mmol/L (ref 135–145)
Total Bilirubin: 0.6 mg/dL (ref 0.3–1.2)
Total Protein: 7.7 g/dL (ref 6.5–8.1)

## 2023-05-01 LAB — RESPIRATORY PANEL BY PCR

## 2023-05-01 LAB — URINALYSIS, ROUTINE W REFLEX MICROSCOPIC
Bilirubin Urine: NEGATIVE
Glucose, UA: NEGATIVE mg/dL
Hgb urine dipstick: NEGATIVE
Ketones, ur: NEGATIVE mg/dL
Leukocytes,Ua: NEGATIVE
Nitrite: NEGATIVE
Protein, ur: NEGATIVE mg/dL
Specific Gravity, Urine: 1.003 — ABNORMAL LOW (ref 1.005–1.030)
pH: 8 (ref 5.0–8.0)

## 2023-05-01 LAB — CSF CELL COUNT WITH DIFFERENTIAL
Eosinophils, CSF: 0 %
Eosinophils, CSF: 1 %
Lymphs, CSF: 11 %
Lymphs, CSF: 16 %
Monocyte-Macrophage-Spinal Fluid: 10 %
Monocyte-Macrophage-Spinal Fluid: 6 %
RBC Count, CSF: 25 /mm3 — ABNORMAL HIGH (ref 0–3)
RBC Count, CSF: 94 /mm3 — ABNORMAL HIGH (ref 0–3)
Segmented Neutrophils-CSF: 74 %
Segmented Neutrophils-CSF: 82 %
Tube #: 1
Tube #: 4
WBC, CSF: 632 /mm3 (ref 0–5)
WBC, CSF: 652 /mm3 (ref 0–5)

## 2023-05-01 LAB — RESP PANEL BY RT-PCR (FLU A&B, COVID) ARPGX2
Influenza A by PCR: NEGATIVE
Influenza B by PCR: NEGATIVE
SARS Coronavirus 2 by RT PCR: NEGATIVE

## 2023-05-01 LAB — PROTEIN AND GLUCOSE, CSF
Glucose, CSF: 53 mg/dL (ref 40–70)
Total  Protein, CSF: 76 mg/dL — ABNORMAL HIGH (ref 15–45)

## 2023-05-01 LAB — PROTIME-INR
INR: 1.1 (ref 0.8–1.2)
Prothrombin Time: 14.4 seconds (ref 11.4–15.2)

## 2023-05-01 LAB — CSF CULTURE W GRAM STAIN

## 2023-05-01 LAB — LACTIC ACID, PLASMA: Lactic Acid, Venous: 1.6 mmol/L (ref 0.5–1.9)

## 2023-05-01 LAB — PROCALCITONIN: Procalcitonin: <0.1 ng/mL

## 2023-05-01 LAB — SARS CORONAVIRUS 2 BY RT PCR: SARS Coronavirus 2 by RT PCR: NEGATIVE

## 2023-05-01 LAB — PATHOLOGIST SMEAR REVIEW

## 2023-05-01 LAB — MAGNESIUM: Magnesium: 1.7 mg/dL (ref 1.7–2.4)

## 2023-05-01 MED ORDER — FLUCONAZOLE 100 MG PO TABS
300.0000 mg | ORAL_TABLET | ORAL | Status: DC
  Administered 2023-05-01 – 2023-05-05 (×3): 300 mg via ORAL
  Filled 2023-05-01 (×3): qty 3

## 2023-05-01 MED ORDER — VANCOMYCIN HCL 1750 MG/350ML IV SOLN
1750.0000 mg | Freq: Once | INTRAVENOUS | Status: AC
  Administered 2023-05-01: 1750 mg via INTRAVENOUS
  Filled 2023-05-01: qty 350

## 2023-05-01 MED ORDER — FAMOTIDINE 20 MG PO TABS
40.0000 mg | ORAL_TABLET | Freq: Every day | ORAL | Status: DC
  Administered 2023-05-01 – 2023-05-05 (×5): 40 mg via ORAL
  Filled 2023-05-01 (×5): qty 2

## 2023-05-01 MED ORDER — ACETAMINOPHEN 325 MG PO TABS
650.0000 mg | ORAL_TABLET | Freq: Four times a day (QID) | ORAL | Status: DC | PRN
  Administered 2023-05-01: 650 mg via ORAL
  Filled 2023-05-01: qty 2

## 2023-05-01 MED ORDER — AMPHETAMINE-DEXTROAMPHETAMINE 10 MG PO TABS
20.0000 mg | ORAL_TABLET | Freq: Three times a day (TID) | ORAL | Status: DC
  Administered 2023-05-01 – 2023-05-02 (×3): 20 mg via ORAL
  Filled 2023-05-01 (×9): qty 2

## 2023-05-01 MED ORDER — MAGIC MOUTHWASH W/LIDOCAINE: 5.0000 mL | Freq: Three times a day (TID) | ORAL | Status: DC | PRN

## 2023-05-01 MED ORDER — SODIUM CHLORIDE 0.9 % IV BOLUS (SEPSIS)
1500.0000 mL | Freq: Once | INTRAVENOUS | Status: AC
  Administered 2023-05-01: 1500 mL via INTRAVENOUS

## 2023-05-01 MED ORDER — TOPIRAMATE 100 MG PO TABS
100.0000 mg | ORAL_TABLET | Freq: Every day | ORAL | Status: DC
  Administered 2023-05-02 – 2023-05-06 (×5): 100 mg via ORAL
  Filled 2023-05-01 (×5): qty 1

## 2023-05-01 MED ORDER — TRAZODONE HCL 50 MG PO TABS: 50.0000 mg | ORAL_TABLET | Freq: Every evening | ORAL | Status: DC | PRN

## 2023-05-01 MED ORDER — POTASSIUM CHLORIDE CRYS ER 20 MEQ PO TBCR
40.0000 meq | EXTENDED_RELEASE_TABLET | Freq: Once | ORAL | Status: AC
  Administered 2023-05-01: 40 meq via ORAL
  Filled 2023-05-01: qty 2

## 2023-05-01 MED ORDER — HYDROMORPHONE HCL 1 MG/ML IJ SOLN
1.0000 mg | Freq: Once | INTRAMUSCULAR | Status: AC
  Administered 2023-05-01: 1 mg via INTRAVENOUS
  Filled 2023-05-01: qty 1

## 2023-05-01 MED ORDER — PANTOPRAZOLE SODIUM 40 MG PO TBEC
40.0000 mg | DELAYED_RELEASE_TABLET | Freq: Two times a day (BID) | ORAL | Status: DC
  Administered 2023-05-01 – 2023-05-06 (×10): 40 mg via ORAL
  Filled 2023-05-01 (×10): qty 1

## 2023-05-01 MED ORDER — ONDANSETRON HCL 4 MG/2ML IJ SOLN
4.0000 mg | Freq: Three times a day (TID) | INTRAMUSCULAR | Status: DC | PRN
  Administered 2023-05-01 – 2023-05-05 (×6): 4 mg via INTRAVENOUS
  Filled 2023-05-01 (×6): qty 2

## 2023-05-01 MED ORDER — SODIUM CHLORIDE 0.9 % IV SOLN
2.0000 g | Freq: Once | INTRAVENOUS | Status: AC
  Administered 2023-05-01: 2 g via INTRAVENOUS
  Filled 2023-05-01: qty 20

## 2023-05-01 MED ORDER — SACCHAROMYCES BOULARDII 250 MG PO CAPS
250.0000 mg | ORAL_CAPSULE | Freq: Two times a day (BID) | ORAL | Status: DC
  Administered 2023-05-01 – 2023-05-06 (×11): 250 mg via ORAL
  Filled 2023-05-01 (×11): qty 1

## 2023-05-01 MED ORDER — CYCLOSPORINE 0.05 % OP EMUL
1.0000 [drp] | Freq: Two times a day (BID) | OPHTHALMIC | Status: DC
  Administered 2023-05-01 – 2023-05-06 (×10): 1 [drp] via OPHTHALMIC
  Filled 2023-05-01 (×10): qty 30

## 2023-05-01 MED ORDER — NYSTATIN 100000 UNIT/ML MT SUSP: 5.0000 mL | Freq: Three times a day (TID) | OROMUCOSAL | Status: DC | PRN

## 2023-05-01 MED ORDER — SUMATRIPTAN SUCCINATE 50 MG PO TABS
25.0000 mg | ORAL_TABLET | ORAL | Status: DC | PRN
  Administered 2023-05-02 – 2023-05-06 (×6): 25 mg via ORAL
  Filled 2023-05-01 (×7): qty 1

## 2023-05-01 MED ORDER — DICYCLOMINE HCL 10 MG PO CAPS: 10.0000 mg | ORAL_CAPSULE | Freq: Every day | ORAL | Status: DC | PRN

## 2023-05-01 MED ORDER — FOLIC ACID 1 MG PO TABS
2.0000 mg | ORAL_TABLET | Freq: Every day | ORAL | Status: DC
  Administered 2023-05-01 – 2023-05-06 (×6): 2 mg via ORAL
  Filled 2023-05-01 (×6): qty 2

## 2023-05-01 MED ORDER — DEXAMETHASONE SODIUM PHOSPHATE 10 MG/ML IJ SOLN
10.0000 mg | Freq: Once | INTRAMUSCULAR | Status: AC
  Administered 2023-05-01: 10 mg via INTRAVENOUS
  Filled 2023-05-01: qty 1

## 2023-05-01 MED ORDER — KETOROLAC TROMETHAMINE 30 MG/ML IJ SOLN
15.0000 mg | Freq: Once | INTRAMUSCULAR | Status: AC
  Administered 2023-05-01: 15 mg via INTRAVENOUS
  Filled 2023-05-01: qty 1

## 2023-05-01 MED ORDER — FENTANYL CITRATE PF 50 MCG/ML IJ SOSY
50.0000 ug | PREFILLED_SYRINGE | Freq: Once | INTRAMUSCULAR | Status: AC
  Administered 2023-05-01: 50 ug via INTRAVENOUS
  Filled 2023-05-01: qty 1

## 2023-05-01 MED ORDER — METOCLOPRAMIDE HCL 5 MG/ML IJ SOLN
10.0000 mg | Freq: Once | INTRAMUSCULAR | Status: AC
  Administered 2023-05-01: 10 mg via INTRAVENOUS
  Filled 2023-05-01: qty 2

## 2023-05-01 MED ORDER — VANCOMYCIN HCL IN DEXTROSE 1-5 GM/200ML-% IV SOLN
1000.0000 mg | Freq: Two times a day (BID) | INTRAVENOUS | Status: DC
  Administered 2023-05-02 (×2): 1000 mg via INTRAVENOUS
  Filled 2023-05-01 (×2): qty 200

## 2023-05-01 MED ORDER — OXYCODONE-ACETAMINOPHEN 5-325 MG PO TABS
1.0000 | ORAL_TABLET | ORAL | Status: DC | PRN
  Administered 2023-05-01 – 2023-05-06 (×19): 1 via ORAL
  Filled 2023-05-01 (×20): qty 1

## 2023-05-01 MED ORDER — DM-GUAIFENESIN ER 30-600 MG PO TB12
1.0000 | ORAL_TABLET | Freq: Two times a day (BID) | ORAL | Status: DC | PRN
  Administered 2023-05-03: 1 via ORAL
  Filled 2023-05-01: qty 1

## 2023-05-01 MED ORDER — SODIUM CHLORIDE 0.9 % IV SOLN
2.0000 g | Freq: Three times a day (TID) | INTRAVENOUS | Status: DC
  Administered 2023-05-01 – 2023-05-02 (×4): 2 g via INTRAVENOUS
  Filled 2023-05-01 (×5): qty 12.5

## 2023-05-01 MED ORDER — TOPIRAMATE 25 MG PO TABS
25.0000 mg | ORAL_TABLET | Freq: Every day | ORAL | Status: DC
  Administered 2023-05-01 – 2023-05-05 (×5): 25 mg via ORAL
  Filled 2023-05-01 (×5): qty 1

## 2023-05-01 MED ORDER — SODIUM CHLORIDE 0.9 % IV SOLN: INTRAVENOUS | Status: DC

## 2023-05-01 MED ORDER — MORPHINE SULFATE (PF) 2 MG/ML IV SOLN
2.0000 mg | INTRAVENOUS | Status: DC | PRN
  Administered 2023-05-01 – 2023-05-04 (×12): 2 mg via INTRAVENOUS
  Filled 2023-05-01 (×12): qty 1

## 2023-05-01 MED ORDER — POLYVINYL ALCOHOL 1.4 % OP SOLN
1.0000 [drp] | Freq: Two times a day (BID) | OPHTHALMIC | Status: DC
  Administered 2023-05-01 – 2023-05-06 (×10): 1 [drp] via OPHTHALMIC
  Filled 2023-05-01 (×3): qty 15

## 2023-05-01 MED ORDER — ALBUTEROL SULFATE (2.5 MG/3ML) 0.083% IN NEBU
3.0000 mL | INHALATION_SOLUTION | RESPIRATORY_TRACT | Status: DC | PRN
  Administered 2023-05-02 – 2023-05-03 (×4): 3 mL via RESPIRATORY_TRACT
  Filled 2023-05-01 (×4): qty 3

## 2023-05-01 MED ORDER — IOHEXOL 350 MG/ML SOLN
75.0000 mL | Freq: Once | INTRAVENOUS | Status: AC | PRN
  Administered 2023-05-01: 75 mL via INTRAVENOUS

## 2023-05-01 MED ORDER — DEXTROSE 5 % IV SOLN
10.0000 mg/kg | Freq: Three times a day (TID) | INTRAVENOUS | Status: DC
  Administered 2023-05-01 – 2023-05-02 (×4): 655 mg via INTRAVENOUS
  Filled 2023-05-01 (×6): qty 13.1

## 2023-05-01 MED ORDER — LORAZEPAM 1 MG PO TABS
1.0000 mg | ORAL_TABLET | Freq: Three times a day (TID) | ORAL | Status: DC | PRN
  Administered 2023-05-02 – 2023-05-03 (×3): 1 mg via ORAL
  Filled 2023-05-01 (×3): qty 1

## 2023-05-01 MED ORDER — OLOPATADINE HCL 0.1 % OP SOLN
1.0000 [drp] | Freq: Two times a day (BID) | OPHTHALMIC | Status: DC
  Administered 2023-05-01 – 2023-05-06 (×10): 1 [drp] via OPHTHALMIC
  Filled 2023-05-01 (×3): qty 5

## 2023-05-01 MED ORDER — PRAVASTATIN SODIUM 20 MG PO TABS
40.0000 mg | ORAL_TABLET | Freq: Every day | ORAL | Status: DC
  Administered 2023-05-01 – 2023-05-05 (×5): 40 mg via ORAL
  Filled 2023-05-01 (×5): qty 2

## 2023-05-01 MED ORDER — ACETAMINOPHEN 500 MG PO TABS
1000.0000 mg | ORAL_TABLET | Freq: Once | ORAL | Status: AC
  Administered 2023-05-01: 1000 mg via ORAL
  Filled 2023-05-01: qty 2

## 2023-05-01 MED ORDER — MOMETASONE FURO-FORMOTEROL FUM 100-5 MCG/ACT IN AERO
2.0000 | INHALATION_SPRAY | Freq: Two times a day (BID) | RESPIRATORY_TRACT | Status: DC
  Administered 2023-05-01 – 2023-05-06 (×10): 2 via RESPIRATORY_TRACT
  Filled 2023-05-01 (×2): qty 8.8

## 2023-05-01 MED ORDER — LACTATED RINGERS IV BOLUS
1000.0000 mL | Freq: Once | INTRAVENOUS | Status: AC
  Administered 2023-05-01: 1000 mL via INTRAVENOUS

## 2023-05-01 MED ORDER — SODIUM CHLORIDE 0.9 % IV SOLN
2.0000 g | INTRAVENOUS | Status: DC
  Administered 2023-05-01 – 2023-05-02 (×4): 2 g via INTRAVENOUS
  Filled 2023-05-01 (×11): qty 2000

## 2023-05-01 MED ORDER — BACLOFEN 10 MG PO TABS
10.0000 mg | ORAL_TABLET | Freq: Every day | ORAL | Status: DC
  Administered 2023-05-01 – 2023-05-05 (×5): 10 mg via ORAL
  Filled 2023-05-01 (×5): qty 1

## 2023-05-01 NOTE — Consult Note (Signed)
Pharmacy Antibiotic Note  [name] Horn is a 47 y.o. female admitted on 05/01/2023 with meningitis.  Pharmacy has been consulted for cefepime and vancomycin dosing.  Vancomycin 1750 mg IV x 1, 5/6 @ 1229  Plan: Start vancomycin 1000 mg IV every 12 hours per nomogram Target trough 15-20 Start cefepime 2 grams IV every 8 hours Follow renal function and culture results for adjustments  Height: 5\' 4"  (162.6 cm) Weight: 81.6 kg (180 lb) IBW/kg (Calculated) : 54.7  Temp (24hrs), Avg:100.4 F (38 C), Min:100.2 F (37.9 C), Max:100.6 F (38.1 C)  Recent Labs  Lab 05/01/23 0610  WBC 5.7  CREATININE 0.90  LATICACIDVEN 1.6    Estimated Creatinine Clearance: 79 mL/min (by C-G formula based on SCr of 0.9 mg/dL).    Allergies  Allergen Reactions   Cyanoacrylate Hives   Other Itching and Rash    Dermabond surgical glue.  Dermabond surgical glue-blisters   Sulfa Antibiotics Anaphylaxis, Rash, Shortness Of Breath and Swelling    Angioedema (also)   Sulfonamide Derivatives Hives, Shortness Of Breath and Swelling    TONGUE SWELLS   Benadryl [Diphenhydramine Hcl] Other (See Comments)    Hyperactivity    Sulfamethoxazole     Other Reaction(s): Angioedema   Diphenhydramine    Diphenhydramine Hcl     Other Reaction(s): Other (See Comments)  HYPERACTIVITY, RESTLESS LEG   Silicone Rash    Watch band    Antimicrobials this admission: vancomycin 5/6 >>  cefepime 5/6 >>  Acyclovir 5/6 >>  Dose adjustments this admission: N/A  Microbiology results: 5/6 BCx: pending 5/6 CSF: pending   Thank you for allowing pharmacy to be a part of this patient's care.  Barrie Folk, PharmD 05/01/2023 12:45 PM

## 2023-05-01 NOTE — ED Provider Notes (Signed)
[facility] Provider Note    Event Date/Time   First MD Initiated Contact with Patient 05/01/23 [phone]     (approximate)   History   Headache and Fever   HPI  [name] Horn is a 47 y.o. 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  Patient tells me that about a week ago she developed cough and shortness of breath.  This has been persistent she has had difficulty getting mucus up but over the last several days cough is productive of green sputum.  She does feel short of breath as well as some pain from coughing.  She has had fever as high as 103 since Thursday.  Fevers daily.  She also endorses a headache.  Initially tells me headache started about a week ago later tells me it started on Saturday.  It is posterior throbbing it is constant but then has moments where it becomes acutely worse.  Her family ember is at bedside says she started complaining of headache last night.  Tells me this is typical of her chronic migraine headaches which she gets about 2-3 times per week.  She did take her Imitrex which did not help.  She does have her typical visual aura which is blue light.  Does not have this now.  Denies any new neck pain.  No sick contacts.  Does have some nausea but no vomiting no diarrhea does have some urinary urgency no dysuria.  Urgency has been going on for about a week and a half.  She does get IVIG monthly.     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   Chronic rhinitis 04/23/2020   Adverse food reaction 04/23/2020   Eosinophilic esophagitis 04/03/2020   Oral candidiasis 04/02/2020   History of Clostridium difficile infection 04/02/2020   History of shingles 04/02/2020   History  of asthma 04/02/2020   Multiple drug allergies 04/02/2020   Environmental allergies 04/02/2020   History of loop recorder 01/19/2020   PVC (premature ventricular contraction) 01/19/2020   Uterine cancer (HCC) 01/19/2020   Colitis 08/14/2019   Constipation    Gastroparesis 04/03/2019   Palpitations 04/10/2018   Nausea and vomiting 04/26/2017   Nausea & vomiting 04/25/2017   Chest pain 04/25/2017   Enterocolitis due to Clostridium difficile 01/04/2016   Incisional hernia without obstruction or gangrene 11/18/2015    Personal history of other diseases of the nervous system and sense organs 10/14/2015   Disease of pancreas 01/21/2015   Other specified bacterial agents as the cause of diseases classified elsewhere 01/21/2015   Radiculopathy of cervical region 12/09/2014   Brachial neuritis 12/09/2014   Cervical radicular pain 12/09/2014   Annual physical exam 01/30/2012   Melena 01/30/2012   Pain in ankle 11/24/2010   Anxiety state 10/11/2010   DVT 10/11/2010   Generalized anxiety disorder 10/11/2010   COSTOCHONDRITIS 03/03/2008   Cellulitis of toe 12/01/2007   MENOPAUSE, PREMATURE 07/20/2007   UTERINE CANCER, HX OF 07/16/2007   Other postprocedural status(V45.89) 07/16/2007     Physical Exam  Triage Vital Signs: ED Triage Vitals  Enc Vitals Group     BP 05/01/23 0615 (!) 142/83     Pulse Rate 05/01/23 0615 (!) 144     Resp 05/01/23 0615 (!) 24     Temp 05/01/23 0615 (!) 100.6 F (38.1 C)     Temp Source 05/01/23 0615 Oral     SpO2 05/01/23 0615 98 %     Weight 05/01/23 0607 180 lb (81.6 kg)     Height 05/01/23 0607 5\' 4"  (1.626 m)     Head Circumference --      Peak Flow --      Pain Score 05/01/23 0607 9     Pain Loc --      Pain Edu? --      Excl. in GC? --     Most recent vital signs: Vitals:   05/01/23 0852 05/01/23 0905  BP:    Pulse: (!) 122 (!) 124  Resp: 20 (!) 30  Temp:    SpO2: 100% 100%     General: Awake, no distress.  CV:  Good peripheral perfusion.  No pitting edema Resp:  Normal effort.  Lung sounds are clear no increased work of breathing Abd:  No distention.  Soft nontender throughout Neuro:             Awake, Alert, Oriented x 3  Other:  Neck is supple no meningismus Pupils are equal and reactive Mucous membranes are dry   ED Results / Procedures / Treatments  Labs (all labs ordered are listed, but only abnormal results are displayed) Labs Reviewed  COMPREHENSIVE METABOLIC PANEL - Abnormal; Notable for the following components:      Result Value    Potassium 3.4 (*)    Glucose, Bld 114 (*)    AST 42 (*)    All other components within normal limits  URINALYSIS, ROUTINE W REFLEX MICROSCOPIC - Abnormal; Notable for the following components:   Color, Urine STRAW (*)    APPearance CLEAR (*)    Specific Gravity, Urine 1.003 (*)    All other components within normal limits  SARS CORONAVIRUS 2 BY RT PCR  RESP PANEL BY RT-PCR (FLU A&B, COVID) ARPGX2  CULTURE, BLOOD (ROUTINE X 2)  CULTURE, BLOOD (ROUTINE X 2)  RESPIRATORY PANEL BY PCR  CSF CULTURE W GRAM STAIN  LACTIC ACID, PLASMA  CBC WITH DIFFERENTIAL/PLATELET  PROTIME-INR  PROCALCITONIN  CSF CELL COUNT WITH DIFFERENTIAL  CSF CELL COUNT WITH DIFFERENTIAL  PROTEIN AND GLUCOSE, CSF  HSV 1/2 PCR, CSF     EKG  EKG interpretation performed by myself: sinus tachycardia, nml axis, nml intervals, no acute ischemic changes   RADIOLOGY I reviewed and interpreted the CXR which does not show any acute cardiopulmonary process    PROCEDURES:  Critical Care performed: Yes, see critical care procedure note(s)  .Critical Care  Performed by: Georga Hacking, MD Authorized by: Georga Hacking, MD   Critical care provider statement:    Critical care time (minutes):  30   Critical care was time spent personally by me on the following activities:  Development of treatment plan with patient or surrogate, discussions with consultants, evaluation of patient's response to treatment, examination of patient, ordering and review of laboratory studies, ordering and review of radiographic studies, ordering and performing treatments and interventions, pulse oximetry, re-evaluation of patient's condition and review of old charts .Lumbar Puncture  Date/Time: 05/01/2023 10:17 AM  Performed by: Georga Hacking, MD Authorized by: Georga Hacking, MD   Consent:    Consent obtained:  Verbal   Risks discussed:  Bleeding, infection, pain, repeat procedure, nerve damage and headache    Alternatives discussed:  No treatment Universal protocol:    Procedure explained and questions answered to patient or proxy's satisfaction: no     Relevant documents present and verified: no     Test results available: no     Imaging studies available: no     Required blood products, implants, devices, and special equipment available: no     Immediately prior to procedure a time out was called: no     Site/side marked: no     Patient identity confirmed:  Verbally with patient Pre-procedure details:    Procedure purpose:  Diagnostic   Preparation: Patient was prepped and draped in usual sterile fashion   Sedation:    Sedation type:  None Anesthesia:    Anesthesia method:  Local infiltration   Local anesthetic:  Lidocaine 1% w/o epi Procedure details:    Lumbar space:  L4-L5 interspace   Patient position:  Sitting   Needle gauge:  20   Needle type:  Spinal needle - Quincke tip   Needle length (in):  3.5   Ultrasound guidance: no     Number of attempts:  1   Fluid appearance:  Clear   Tubes of fluid:  4   Total volume (ml):  4 Post-procedure details:    Puncture site:  Adhesive bandage applied   Procedure completion:  Tolerated   The patient is on the cardiac monitor to evaluate for evidence of arrhythmia and/or significant heart rate changes.   MEDICATIONS ORDERED IN ED: Medications  cefTRIAXone (ROCEPHIN) 2 g in sodium chloride 0.9 % 100 mL IVPB (has no administration in time range)  vancomycin (VANCOREADY) IVPB 1750 mg/350 mL (has no administration in time range)  HYDROmorphone (DILAUDID) injection 1 mg (has no administration in time range)  sodium chloride 0.9 % bolus 1,500 mL (0 mLs Intravenous Stopped 05/01/23 1002)  acetaminophen (TYLENOL) tablet 1,000 mg (1,000 mg Oral Given 05/01/23 0654)  lactated ringers bolus 1,000 mL (1,000 mLs Intravenous New Bag/Given 05/01/23 0803)  ketorolac (TORADOL) 30 MG/ML injection 15 mg (15 mg Intravenous Given 05/01/23 0801)  metoCLOPramide  (REGLAN) injection 10 mg (10 mg Intravenous Given  05/01/23 0802)  iohexol (OMNIPAQUE) 350 MG/ML injection 75 mL (75 mLs Intravenous Contrast Given 05/01/23 0806)  fentaNYL (SUBLIMAZE) injection 50 mcg (50 mcg Intravenous Given 05/01/23 1002)     IMPRESSION / MDM / ASSESSMENT AND PLAN / ED COURSE  I reviewed the triage vital signs and the nursing notes.                              Patient's presentation is most consistent with acute presentation with potential threat to life or bodily function.  Differential diagnosis includes, but is not limited to, pneumonia, viral illness, bacteremia, meningitis, encephalitis, UTI, pyelonephritis  The patient is a 47 year old female with history of specific antibody deficiency on IVIG who presents because of cough congestion fever and headache.  Her symptoms started about a week ago initially with respiratory symptoms including congestion cough and dyspnea which is persistent since onset.  Cough is productive of green sputum and her dyspnea is worsening.  Initially patient does not even mention headache to me that her family members at bedside reminds her of the headache and she does say that she has had about several days of headache that is typical of her chronic migraine headaches.  It is posterior and constant has intermittent sharp periods of worsening as well as a visual aura that is bright blue light that she had yesterday.  Denies any new neck pain.  She does have some urinary urgency as well but no frequency or dysuria no abdominal pain or other GI symptoms.  On arrival to the ER patient does meet SIRS criteria with temp of 100.6 tachycardic to 130s and tachypneic to the 20s.  She is saturating 100% with stable blood pressure.  On exam patient looks nontoxic but unwell.  Neurologic exam is nonfocal she has no meningismus.  Mucous membranes are dry lung sounds are clear she has no increased work of breathing and her abdominal exam is benign.  Differentials  as above.  Overall constellation of symptoms to me suggest more viral process or primary pulmonary process given she does have significant respiratory symptoms.  Did consider diagnosis of meningitis/encephalitis given her headache over patient did not even mention headache to me without being prompted by her family member and when she does tell me that the headache says it is typical of her migraine headaches that she gets 2-3 times per week.  Thus my suspicion for CNS infection is lower down.  Plan to obtain CT head as well as chest x-ray will give bolus of fluid and sepsis labs including lactate and cultures.  CBC and CMP are largely within normal limits.  Lactate is negative.  Urinalysis does not suggest infection.  CT head is normal.  Did obtain a CT angio as well as chest x-ray was clear looking for possible PE given her tachycardia as well as an occult pneumonia and this is also negative.  On reassessment patient's temp is 100.2 she is persistently tachycardic and her only complaint right now is significant headache.  At this point do not feel that we can safely rule out meningitis and given she is immunocompromised despite her negative lab workup do think she needs lumbar puncture.  Lumbar puncture was successful on first attempt there was clear fluid that resulted.  Patient tolerated the procedure.  Will cover empirically with Vanco and ceftriaxone.  I will also order HSV panel.  Given ongoing headache and abnormal vital signs will talk to  the hospitalist for admission.       FINAL CLINICAL IMPRESSION(S) / ED DIAGNOSES   Final diagnoses:  Intractable headache, unspecified chronicity pattern, unspecified headache type  Sepsis, due to unspecified organism, unspecified whether acute organ dysfunction present [facility])     Rx / DC Orders   ED Discharge Orders     None        Note:  This document was prepared using Dragon voice recognition software and may include unintentional dictation  errors.   Georga Hacking, MD 05/01/23 1018

## 2023-05-01 NOTE — H&P (Signed)
History and Physical    [name] Horn WUJ:[id number] DOB: 1976-01-02 DOA: 05/01/2023  Referring MD/NP/PA:   [facility]P: [facility]   Patient coming from:  The patient is coming from home.     Chief Complaint: Headache, fever, chills  HPI: [name] Horn is a 47 y.o. 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 difficile colitis, rheumatoid arthritis, herpes labialis, gastroparesis, left leg DVT 2011 not on anticoagulants, who presents with headache, fever and chills.  Patient states that she started having worsening headache, fever, chills 2 days ago.  She had temperature of 103 at home.  The headache is located in the occipital area, constant, aching, nonradiating.  She has mild photophobia and posterior neck tightness.  Patient has dry cough, shortness of breath.  She had some chest discomfort recently, but not today.  Patient has nausea, no vomiting.  Patient had 2 episode of diarrhea 3 days ago which has resolved.  Today no active diarrhea.  She also has intermittent abdominal pain which she attributes to gastroparesis.  Patient has urinary urgency and burning on urination, no dysuria or hematuria.  Lumbar puncture was done by EDP, initial CSF analysis showed WBC 635, 623 with segment 74, 82, red blood cell 94, 25, lymphocytes 16, 16, protein 76, 76, glucose 53, 53.    Data reviewed independently and ED Course: pt was found to have WBC 5.7, potassium 3.4, lactic acid 1.6, negative urinalysis, procalcitonin<0.10, GFR> 60, negative COVID PCR.  Chest x-ray negative.  CT head negative.  CTA negative for PE.  Patient is admitted to PCU as inpatient.  Consulted Dr. Drue Second of ID   EKG: I have personally reviewed.  Sinus rhythm, QTc 413, heart rate 145, LAD, poor R wave progression   Review of Systems:    General: has fevers, chills, no body weight gain, has fatigue HEENT: no blurry vision, hearing changes or sore throat Respiratory: has dyspnea, coughing, no wheezing CV: no chest pain, no palpitations GI: has nausea, abdominal pain, diarrhea, no constipation, vomiting,  GU: no dysuria, has burning on urination, urgency, no hematuria  Ext: has trace leg edema Neuro: no unilateral weakness, numbness, or tingling, no vision change or hearing loss Skin: no rash, no skin tear. MSK: No muscle spasm, no deformity, no limitation of range of movement in spin Heme: No easy bruising.  Travel history: No recent long distant travel.   Allergy:  Allergies  Allergen Reactions   Cyanoacrylate Hives   Other Itching and Rash    Dermabond surgical glue.  Dermabond surgical glue-blisters   Sulfa Antibiotics Anaphylaxis, Rash, Shortness Of Breath and Swelling    Angioedema (also)   Sulfonamide Derivatives Hives, Shortness Of Breath and Swelling    TONGUE SWELLS   Benadryl [Diphenhydramine Hcl] Other (See Comments)    Hyperactivity    Sulfamethoxazole     Other Reaction(s): Angioedema   Diphenhydramine    Diphenhydramine Hcl     Other Reaction(s): Other (See Comments)  HYPERACTIVITY, RESTLESS LEG   Silicone Rash    Watch band    Past Medical History:  Diagnosis Date   Cellulitis, leg 09/14/2021   Complication of anesthesia    " i TAKE A LIITLE BIT LONGER TO WAKRE UP "   COVID-19 07/06/2021   DVT (deep venous thrombosis) (HCC)    x2   Fatigue 12/30/2020   Gastroparesis 08/07/2019  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   Herpes labialis 08/12/2020   IBD (inflammatory bowel disease)    Intertrigo 06/22/2020   Leukopenia 07/06/2021   Lymphedema    Malaise 12/30/2020   RA (rheumatoid arthritis) (HCC)    Rheumatoid arthritis (HCC) 08/12/2020   Transaminitis 08/12/2020   Uterine cancer [facility])     Past Surgical History:  Procedure Laterality Date   APPENDECTOMY     cheek biopsy   11/2020   CHOLECYSTECTOMY     ESOPHAGOGASTRODUODENOSCOPY N/A 04/30/2017   Procedure: ESOPHAGOGASTRODUODENOSCOPY (EGD);  Surgeon: Dorena Cookey, MD;  Location: [facility] ENDOSCOPY;  Service: Endoscopy;  Laterality: N/A;   HERNIA REPAIR     x2   KNEE SURGERY     x3   MINIMALLY INVASIVE FORAMINOTOMY CERVICAL SPINE     C6-T1, Nitka   SAVORY DILATION N/A 04/30/2017   Procedure: SAVORY DILATION;  Surgeon: Dorena Cookey, MD;  Location: [facility] ENDOSCOPY;  Service: Endoscopy;  Laterality: N/A;   TONSILLECTOMY     TOTAL ABDOMINAL HYSTERECTOMY     Sarcoma, s/p XRT    Social History:  reports that she has never smoked. She has been exposed to tobacco smoke. She has never used smokeless tobacco. She reports that she does not currently use alcohol. She reports current drug use.  Family History:  Family History  Problem Relation Age of Onset   Hashimoto's thyroiditis Mother    Eczema Mother    Angioedema Mother    Colonic polyp Mother    Throat cancer Father    Arrhythmia Father    Angioedema Maternal Grandfather    Hypertension Other    Hyperlipidemia Other    Colon cancer Other        grandmother     Prior to Admission medications   Medication Sig Start Date End Date Taking? Authorizing Provider  albuterol (PROVENTIL) (2.5 MG/3ML) 0.083% nebulizer solution Take 3 mLs (2.5 mg total) by nebulization every 4 (four) hours as needed for wheezing or shortness of breath. 11/20/22   Pahwani, Kasandra Knudsen, MD  amphetamine-dextroamphetamine (ADDERALL) 20 MG tablet Take 20 mg by mouth 3 (three) times daily.    [provider]  atropine 1 % ophthalmic solution Place 1 drop into the left eye daily as needed (for dry eye).    [provider]  baclofen (LIORESAL) 10 MG tablet Take 10 mg by mouth at bedtime. 02/28/20   [provider]  Bepotastine Besilate 1.5 % SOLN Place 1 drop into both eyes 2 (two) times a day.    [provider]  budesonide-formoterol (SYMBICORT) 80-4.5 MCG/ACT inhaler  Inhale 2 puffs into the lungs 2 (two) times daily. Can also take Q4 hr as needed for wheezing 11/17/22   Burnadette Pop, MD  cyclobenzaprine (FLEXERIL) 10 MG tablet Take 1 tablet (10 mg total) by mouth at bedtime. 11/20/22   Pahwani, Kasandra Knudsen, MD  cycloSPORINE (RESTASIS) 0.05 % ophthalmic emulsion Place 1 drop into both eyes 2 (two) times daily.    [provider]  dicyclomine (BENTYL) 10 MG capsule Take 10 mg by mouth daily as needed for nausea/vomiting. 01/02/19   [provider]  Dupilumab 300 MG/2ML SOPN Inject 300 mg into the skin once a week. Friday    [provider]  famotidine (PEPCID) 40 MG tablet Take 40 mg by mouth at bedtime. 05/02/21   [provider]  fluconazole (DIFLUCAN) 150 MG tablet Take 300 mg by mouth every Monday, Wednesday, and Friday.    [provider]  folic acid (  FOLVITE) 1 MG tablet Take 2 tablets (2 mg total) by mouth daily. 10/30/19   Pollyann Savoy, MD  furosemide (LASIX) 20 MG tablet Take 60 mg by mouth 2 (two) times daily.    [provider]  LORazepam (ATIVAN) 1 MG tablet Take 1 mg by mouth 3 (three) times daily as needed for anxiety. 03/08/20   [provider]  magic mouthwash (nystatin, lidocaine, diphenhydrAMINE, alum & mag hydroxide) suspension Swish and spit 5 mLs 3 (three) times daily as needed for mouth pain. 06/10/21   Randall Hiss, MD  meloxicam (MOBIC) 15 MG tablet Take 1 tablet (15 mg total) by mouth daily. Patient taking differently: Take 15 mg by mouth as needed for pain. 07/25/21   Gilda Crease, MD  metoCLOPramide (REGLAN) 10 MG tablet Take 10 mg by mouth 2 (two) times daily.    [provider]  metoprolol succinate (TOPROL-XL) 25 MG 24 hr tablet Take 50 mg by mouth daily.    [provider]  nystatin (MYCOSTATIN) 100000 UNIT/ML suspension Take 10 mLs (1,000,000 Units total) by mouth 3 (three) times daily. 12/07/20   Randall Hiss, MD  nystatin powder  Apply 1 application topically 2 (two) times daily as needed (rash).    [provider]  ondansetron (ZOFRAN-ODT) 4 MG disintegrating tablet Take 1 tablet (4 mg total) by mouth every 8 (eight) hours as needed for nausea or vomiting. 06/28/22   Chesley Noon, MD  oxyCODONE (ROXICODONE) 5 MG immediate release tablet Take 1 tablet (5 mg total) by mouth every 8 (eight) hours as needed for breakthrough pain (in addition to your baseline medications). Patient not taking: Reported on 10/04/2022 09/25/22 09/25/23  Shaune Pollack, MD  oxyCODONE-acetaminophen (PERCOCET) 10-325 MG tablet Take 1 tablet by mouth every 4 (four) hours as needed for pain.    [provider]  pantoprazole (PROTONIX) 40 MG tablet Take 1 tablet (40 mg total) by mouth 2 (two) times daily. 05/02/17 08/04/28  Elgergawy, Leana Roe, MD  Polyethyl Glycol-Propyl Glycol (SYSTANE) 0.4-0.3 % GEL ophthalmic gel Place 1 application into both eyes in the morning and at bedtime.    [provider]  Potassium Chloride ER 20 MEQ TBCR Take 20 mEq by mouth daily as needed (when taking fursoemide).    [provider]  pravastatin (PRAVACHOL) 40 MG tablet Take 40 mg by mouth at bedtime.    [provider]  prednisoLONE acetate (PRED FORTE) 1 % ophthalmic suspension Place 1 drop into the left eye as needed (conjunctivitis).    [provider]  promethazine (PHENERGAN) 25 MG tablet Take 1 tablet (25 mg total) by mouth every 6 (six) hours as needed for nausea or vomiting. 09/25/22   Shaune Pollack, MD  sucralfate (CARAFATE) 1 g tablet Take 1 tablet (1 g total) by mouth 4 (four) times daily for 7 days. 09/25/22 10/04/22  Shaune Pollack, MD  SUMAtriptan (IMITREX) 25 MG tablet Take 25 mg by mouth every 2 (two) hours as needed for migraine. May repeat in 2 hours if headache persists or recurs.    [provider]  topiramate (TOPAMAX) 100 MG tablet Take 100 mg by mouth daily. 06/18/19   [provider]   topiramate (TOPAMAX) 25 MG tablet Take 25 mg by mouth at bedtime.     [provider]  traZODone (DESYREL) 150 MG tablet Take 150 mg by mouth at bedtime as needed for sleep.    [provider]  triamcinolone lotion (KENALOG) 0.1 %  Apply 1 application topically daily as needed (rash). 07/07/20   [provider]  valACYclovir (VALTREX) 1000 MG tablet Take 1 tablet (1,000 mg total) by mouth daily. Patient not taking: Reported on 10/04/2022 08/12/20   Randall Hiss, MD    Physical Exam: Vitals:   05/01/23 2000 05/01/23 2300 05/02/23 0200 05/02/23 0500  BP: 134/82 138/80 133/89 138/85  Pulse: 66 72 81 77  Resp: 18 18 16 18   Temp: 98.6 F (37 C)   99.1 F (37.3 C)  TempSrc:      SpO2: 94% 96% 95% 95%  Weight:      Height:       General: Not in acute distress HEENT:       Eyes: PERRL, EOMI, no scleral icterus.       ENT: No discharge from the ears and nose, no pharynx injection, no tonsillar enlargement.        Neck: No JVD, no bruit, no mass felt. Heme: No neck lymph node enlargement. Cardiac: S1/S2, RRR, No murmurs, No gallops or rubs. Respiratory: No rales, wheezing, rhonchi or rubs. GI: Soft, nondistended, nontender, no rebound pain, no organomegaly, BS present. GU: No hematuria Ext: has trace leg edema bilaterally. 1+DP/PT pulse bilaterally. Musculoskeletal: No joint deformities, No joint redness or warmth, no limitation of ROM in spin. Skin: No rashes.  Neuro: Alert, oriented X3, cranial nerves II-XII grossly intact, moves all extremities normally. Psych: Patient is not psychotic, no suicidal or hemocidal ideation.  Labs on Admission: I have personally reviewed following labs and imaging studies  CBC: Recent Labs  Lab 05/01/23 0610 05/02/23 0407  WBC 5.7 8.2  NEUTROABS 3.8  --   HGB 12.9 11.7*  HCT 38.5 34.2*  MCV 88.7 87.7  PLT 219 208   Basic Metabolic Panel: Recent Labs  Lab 05/01/23 0610 05/01/23 0738 05/02/23 0407  NA 138   --  141  K 3.4*  --  4.5  CL 108  --  118*  CO2 22  --  17*  GLUCOSE 114*  --  126*  BUN 6  --  8  CREATININE 0.90  --  0.72  CALCIUM 9.3  --  9.1  MG  --  1.7  --    GFR: Estimated Creatinine Clearance: 88.9 mL/min (by C-G formula based on SCr of 0.72 mg/dL). Liver Function Tests: Recent Labs  Lab 05/01/23 0610  AST 42*  ALT 20  ALKPHOS 93  BILITOT 0.6  PROT 7.7  ALBUMIN 3.8   No results for input(s): "LIPASE", "AMYLASE" in the last 168 hours. No results for input(s): "AMMONIA" in the last 168 hours. Coagulation Profile: Recent Labs  Lab 05/01/23 0619  INR 1.1   Cardiac Enzymes: No results for input(s): "CKTOTAL", "CKMB", "CKMBINDEX", "TROPONINI" in the last 168 hours. BNP (last 3 results) No results for input(s): "PROBNP" in the last 8760 hours. HbA1C: No results for input(s): "HGBA1C" in the last 72 hours. CBG: No results for input(s): "GLUCAP" in the last 168 hours. Lipid Profile: No results for input(s): "CHOL", "HDL", "LDLCALC", "TRIG", "CHOLHDL", "LDLDIRECT" in the last 72 hours. Thyroid Function Tests: No results for input(s): "TSH", "T4TOTAL", "FREET4", "T3FREE", "THYROIDAB" in the last 72 hours. Anemia Panel: No results for input(s): "VITAMINB12", "FOLATE", "FERRITIN", "TIBC", "IRON", "RETICCTPCT" in the last 72 hours. Urine analysis:    Component Value Date/Time   COLORURINE STRAW (A) 05/01/2023 0610   APPEARANCEUR CLEAR (A) 05/01/2023 0610   LABSPEC 1.003 (L) 05/01/2023 0610   PHURINE 8.0  05/01/2023 0610   GLUCOSEU NEGATIVE 05/01/2023 0610   HGBUR NEGATIVE 05/01/2023 0610   HGBUR negative 07/25/2007 1131   BILIRUBINUR NEGATIVE 05/01/2023 0610   KETONESUR NEGATIVE 05/01/2023 0610   PROTEINUR NEGATIVE 05/01/2023 0610   UROBILINOGEN 1.0 05/27/2009 1500   NITRITE NEGATIVE 05/01/2023 0610   LEUKOCYTESUR NEGATIVE 05/01/2023 0610   Sepsis Labs: @LABRCNTIP (procalcitonin:4,lacticidven:4) ) Recent Results (from the past 240 hour(s))  SARS  Coronavirus 2 by RT PCR (hospital order, performed in [facility] hospital lab) *cepheid single result test* Anterior Nasal Swab     Status: None   Collection Time: 05/01/23  6:30 AM   Specimen: Anterior Nasal Swab  Result Value Ref Range Status   SARS Coronavirus 2 by RT PCR NEGATIVE NEGATIVE Final    Comment: (NOTE) SARS-CoV-2 target nucleic acids are NOT DETECTED.  The SARS-CoV-2 RNA is generally detectable in upper and lower respiratory specimens during the acute phase of infection. The lowest concentration of SARS-CoV-2 viral copies this assay can detect is 250 copies / mL. A negative result does not preclude SARS-CoV-2 infection and should not be used as the sole basis for treatment or other patient management decisions.  A negative result may occur with improper specimen collection / handling, submission of specimen other than nasopharyngeal swab, presence of viral mutation(s) within the areas targeted by this assay, and inadequate number of viral copies (<250 copies / mL). A negative result must be combined with clinical observations, patient history, and epidemiological information.  Fact Sheet for Patients:   RoadLapTop.co.za  Fact Sheet for Healthcare Providers: http://kim-miller.com/  This test is not yet approved or  cleared by the [place] FDA and has been authorized for detection and/or diagnosis of SARS-CoV-2 by FDA under an Emergency Use Authorization (EUA).  This EUA will remain in effect (meaning this test can be used) for the duration of the COVID-19 declaration under Section 564(b)(1) of the Act, 21 U.S.C. section 360bbb-3(b)(1), unless the authorization is terminated or revoked sooner.  Performed at [facility], [address] [place], [place] [address]   Resp Panel by RT-PCR (Flu A&B, Covid)     Status: None   Collection Time: 05/01/23  6:35 AM  Result Value Ref Range Status   SARS Coronavirus  2 by RT PCR NEGATIVE NEGATIVE Final    Comment: (NOTE) SARS-CoV-2 target nucleic acids are NOT DETECTED.  The SARS-CoV-2 RNA is generally detectable in upper respiratory specimens during the acute phase of infection. The lowest concentration of SARS-CoV-2 viral copies this assay can detect is 138 copies/mL. A negative result does not preclude SARS-Cov-2 infection and should not be used as the sole basis for treatment or other patient management decisions. A negative result may occur with  improper specimen collection/handling, submission of specimen other than nasopharyngeal swab, presence of viral mutation(s) within the areas targeted by this assay, and inadequate number of viral copies(<138 copies/mL). A negative result must be combined with clinical observations, patient history, and epidemiological information. The expected result is Negative.  Fact Sheet for Patients:  BloggerCourse.com  Fact Sheet for Healthcare Providers:  SeriousBroker.it  This test is no t yet approved or cleared by the [place] FDA and  has been authorized for detection and/or diagnosis of SARS-CoV-2 by FDA under an Emergency Use Authorization (EUA). This EUA will remain  in effect (meaning this test can be used) for the duration of the COVID-19 declaration under Section 564(b)(1) of the Act, 21 U.S.C.section 360bbb-3(b)(1), unless the authorization is terminated  or  revoked sooner.       Influenza A by PCR NEGATIVE NEGATIVE Final   Influenza B by PCR NEGATIVE NEGATIVE Final    Comment: (NOTE) The Xpert Xpress SARS-CoV-2/FLU/RSV plus assay is intended as an aid in the diagnosis of influenza from Nasopharyngeal swab specimens and should not be used as a sole basis for treatment. Nasal washings and aspirates are unacceptable for Xpert Xpress SARS-CoV-2/FLU/RSV testing.  Fact Sheet for Patients: BloggerCourse.com  Fact  Sheet for Healthcare Providers: SeriousBroker.it  This test is not yet approved or cleared by the [place] FDA and has been authorized for detection and/or diagnosis of SARS-CoV-2 by FDA under an Emergency Use Authorization (EUA). This EUA will remain in effect (meaning this test can be used) for the duration of the COVID-19 declaration under Section 564(b)(1) of the Act, 21 U.S.C. section 360bbb-3(b)(1), unless the authorization is terminated or revoked.  Performed at [facility], [address] [place], [place] [address]   CSF culture w Gram Stain     Status: None (Preliminary result)   Collection Time: 05/01/23 10:20 AM   Specimen: CSF; Cerebrospinal Fluid  Result Value Ref Range Status   Specimen Description CSF  Final   Special Requests NONE  Final   Gram Stain   Final    WBC SEEN NO RBC SEEN NO ORGANISMS SEEN Performed at [facility], [address] [place], [place] [address]    Culture PENDING  Incomplete   Report Status PENDING  Incomplete  Respiratory (~20 pathogens) panel by PCR     Status: None   Collection Time: 05/01/23  2:09 PM   Specimen: Nasopharyngeal Swab; Respiratory  Result Value Ref Range Status   Adenovirus NOT DETECTED NOT DETECTED Final   Coronavirus 229E NOT DETECTED NOT DETECTED Final    Comment: (NOTE) The Coronavirus on the Respiratory Panel, DOES NOT test for the novel  Coronavirus (2019 nCoV)    Coronavirus HKU1 NOT DETECTED NOT DETECTED Final   Coronavirus NL63 NOT DETECTED NOT DETECTED Final   Coronavirus OC43 NOT DETECTED NOT DETECTED Final   Metapneumovirus NOT DETECTED NOT DETECTED Final   Rhinovirus / Enterovirus NOT DETECTED NOT DETECTED Final   Influenza A NOT DETECTED NOT DETECTED Final   Influenza B NOT DETECTED NOT DETECTED Final   Parainfluenza Virus 1 NOT DETECTED NOT DETECTED Final   Parainfluenza Virus 2 NOT DETECTED NOT DETECTED Final   Parainfluenza Virus 3 NOT DETECTED  NOT DETECTED Final   Parainfluenza Virus 4 NOT DETECTED NOT DETECTED Final   Respiratory Syncytial Virus NOT DETECTED NOT DETECTED Final   Bordetella pertussis NOT DETECTED NOT DETECTED Final   Bordetella Parapertussis NOT DETECTED NOT DETECTED Final   Chlamydophila pneumoniae NOT DETECTED NOT DETECTED Final   Mycoplasma pneumoniae NOT DETECTED NOT DETECTED Final    Comment: Performed at [facility], [address] [place], [place] [address]     Radiological Exams on Admission: CT Angio Chest PE W and/or Wo Contrast  Result Date: 05/01/2023 CLINICAL DATA:  Headache and fever since yesterday. EXAM: CT ANGIOGRAPHY CHEST WITH CONTRAST TECHNIQUE: Multidetector CT imaging of the chest was performed using the standard protocol during bolus administration of intravenous contrast. Multiplanar CT image reconstructions and MIPs were obtained to evaluate the vascular anatomy. RADIATION DOSE REDUCTION: This exam was performed according to the departmental dose-optimization program which includes automated exposure control, adjustment of the mA and/or kV according to patient size and/or use of iterative reconstruction technique. CONTRAST:  75mL  OMNIPAQUE IOHEXOL 350 MG/ML SOLN COMPARISON:  Chest radiograph performed earlier on the same date and CT examination dated December 09, 2022 FINDINGS: Cardiovascular: Satisfactory opacification of the pulmonary arteries to the segmental level. No evidence of pulmonary embolism. Normal heart size. No pericardial effusion. Mediastinum/Nodes: No enlarged mediastinal, hilar, or axillary lymph nodes. Thyroid gland, trachea, and esophagus demonstrate no significant findings. Lungs/Pleura: Lungs are clear. No pleural effusion or pneumothorax. Upper Abdomen: No acute abnormality. Musculoskeletal: No chest wall abnormality. No acute or significant osseous findings. Review of the MIP images confirms the above findings. IMPRESSION: No evidence of pulmonary embolism or acute  intrathoracic process. Electronically Signed   By: Larose Hires D.O.   On: 05/01/2023 08:29   CT HEAD WO CONTRAST ( )  Result Date: 05/01/2023 CLINICAL DATA:  Provided history: Headache, fever. EXAM: CT HEAD WITHOUT CONTRAST TECHNIQUE: Contiguous axial images were obtained from the base of the skull through the vertex without intravenous contrast. RADIATION DOSE REDUCTION: This exam was performed according to the departmental dose-optimization program which includes automated exposure control, adjustment of the mA and/or kV according to patient size and/or use of iterative reconstruction technique. COMPARISON:  CT examinations 10/11/2022 and earlier. FINDINGS: Brain: Cerebral volume is normal. Redemonstrated small chronic hypodensity within the left parietal white matter, favored to reflect a prominent perivascular space. There is no acute intracranial hemorrhage. No demarcated cortical infarct. No extra-axial fluid collection. No evidence of an intracranial mass. No midline shift. Vascular: No hyperdense vessel. Atherosclerotic calcifications. Skull: No fracture or aggressive osseous lesion. Sinuses/Orbits: No mass or acute finding within the imaged orbits. Minimal mucosal thickening within the inferior left frontal sinus and within a right ethmoid air cell. Otherwise, no significant phlegm a tori paranasal sinus disease at the imaged levels. IMPRESSION: 1. No evidence of an acute intracranial abnormality. 2. Minimal mucosal thickening within the left frontal and right ethmoid sinuses. Electronically Signed   By: Jackey Loge D.O.   On: 05/01/2023 08:20   DG Chest Port 1 View  Result Date: 05/01/2023 CLINICAL DATA:  [id number] with sepsis, headache and fever. EXAM: PORTABLE CHEST 1 VIEW COMPARISON:  PA Lat 12/08/2022, CTA chest 12/09/2022 FINDINGS: The heart size and mediastinal contours are within normal limits. A loop recorder device is implanted in the left chest wall as before. Both lungs are clear with  slightly elevated right hemidiaphragm. The visualized skeletal structures are unremarkable. IMPRESSION: No active disease.  Stable chest. Electronically Signed   By: Almira Bar M.D.   On: 05/01/2023 07:32      Assessment/Plan Active Problems:   Meningitis   Sepsis (HCC)   Antibody deficiency syndrome (HCC)   RA (rheumatoid arthritis) (HCC)   HLD (hyperlipidemia)   History of asthma   Migraine   Eosinophilic esophagitis   Hypokalemia   Oral thrush   Obesity with body mass index (BMI) of 30.0 to 39.9   Assessment and Plan:   Sepsis due to meningitis: Lumbar puncture was done by EDP, initial CSF analysis showed WBC 635, 623 with segment 74, 82, red blood cell 94, 25, lymphocytes 16, 16, protein 76, 76, glucose 53, 53.  Patient admits Creacy for sepsis with temperature 100.6, heart rate 125, RR 30.  Lactic acid is normal 1.6. Consulted Dr. Drue Second of ID  -will admit to SDU with droplet isolation -IV Dexamethasone (DECADRON) injection 10 mg x 1 -will start IV Vancomycin, cefepime and acyclovir -will get Procalcitonin -IVF: total of 2.5 L of IVF bolus, followed by 75 cc/h of NL -  Frequent neuro check. -droplet precaution  Antibody deficiency syndrome (HCC) -pt is receiving IVIG as outp  RA: -pt's methotrexate is on hold per patient  HLD (hyperlipidemia) -Pravastatin  History of asthma: -As needed bronchodilators  Migraine -As needed sumatriptan  Eosinophilic esophagitis -Pepcid and Protonix  Hypokalemia: Potassium 0.4 -Repleted potassium -Check magnesium level  Oral thrush -Diflucan  Obesity with body mass index (BMI) of 30.0 to 39.9: Body weight 81.6 kg, BMI 30.90 -Encourage losing weight -Exercise and healthy diet      DVT ppx: SCD  Code Status: Full code  Family Communication:    Yes, patient's husband at bed side.    Disposition Plan:  Anticipate discharge back to previous environment  Consults called:  Consulted Dr. Drue Second of ID  Admission  status and Level of care: Progressive:   as inpt     Dispo: The patient is from: Home              Anticipated d/c is to: Home              Anticipated d/c date is: 2 days              Patient currently is not medically stable to d/c.    Severity of Illness:  The appropriate patient status for this patient is INPATIENT. Inpatient status is judged to be reasonable and necessary in order to provide the required intensity of service to ensure the patient's safety. The patient's presenting symptoms, physical exam findings, and initial radiographic and laboratory data in the context of their chronic comorbidities is felt to place them at high risk for further clinical deterioration. Furthermore, it is not anticipated that the patient will be medically stable for discharge from the hospital within 2 midnights of admission.   * I certify that at the point of admission it is my clinical judgment that the patient will require inpatient hospital care spanning beyond 2 midnights from the point of admission due to high intensity of service, high risk for further deterioration and high frequency of surveillance required.*       Date of Service 05/02/2023    Lorretta Harp [facility]   If 7PM-7AM, please contact night-coverage [email or web address] 05/02/2023, 8:56 AM

## 2023-05-01 NOTE — Progress Notes (Signed)
[facility]  Total days of antibiotics 1/vanco/cefepime/acy       Reason for Consult: meningitis   Referring Physician: Clyde Lundborg  Active Problems:   History of asthma   Eosinophilic esophagitis   Antibody deficiency syndrome (HCC)   HLD (hyperlipidemia)   Obesity with body mass index (BMI) of 30.0 to 39.9   RA (rheumatoid arthritis) (HCC)   Migraine   Oral thrush   Hypokalemia   Meningitis   Sepsis (HCC)    HPI: [name] Horn is a 47 y.o. female with history of immune-deficiency on monthly IVIG, hx of RA, HLD, hx of asthma,and chronic migraine headache, she is  admitted with fevers and 2 days of worsening headache with associated nuchal rigidity and photophobia. She also has reported having worsening congestion, cough and shortness of breath. She reports fevers x 4 days as high as 103F. But 3 days of HA but the onset was similar to her chronic HA. She underwent LP in the ED. No comment on opening pressure. She was found to have neutrophilic predominant pleocytosis with CSF WBC of 682 with 82%N, TP 76 and glu 53.gram stain negative. Meningitis panel Pending.  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  Dermabond surgical glue-blisters   Sulfa Antibiotics Anaphylaxis, Rash, Shortness Of Breath and Swelling     Angioedema (also)   Sulfonamide Derivatives Hives, Shortness Of Breath and Swelling    TONGUE SWELLS   Benadryl [Diphenhydramine Hcl] Other (See Comments)    Hyperactivity    Sulfamethoxazole     Other Reaction(s): Angioedema   Diphenhydramine    Diphenhydramine Hcl     Other Reaction(s): Other (See Comments)  HYPERACTIVITY, RESTLESS LEG   Silicone Rash    Watch band    Current antibiotics:   MEDICATIONS:  amphetamine-dextroamphetamine  20 mg Oral TID   baclofen  10 mg Oral QHS   cycloSPORINE  1 drop Both Eyes BID   famotidine  40 mg Oral QHS   fluconazole  300 mg Oral Q M,W,F   folic acid  2 mg Oral Daily   mometasone-formoterol  2 puff Inhalation BID   olopatadine  1 drop Both Eyes BID   pantoprazole  40 mg Oral BID   polyvinyl alcohol  1 drop Both Eyes BID   pravastatin  40 mg Oral QHS   saccharomyces boulardii  250 mg Oral BID   [START ON 05/02/2023] topiramate  100 mg Oral Daily   topiramate  25 mg Oral QHS    Social History   Tobacco Use   Smoking status: Never    Passive exposure: Yes   Smokeless tobacco: Never  Vaping Use   Vaping Use: Never used  Substance Use Topics   Alcohol use: Not  Currently   Drug use: Yes    Comment: prescribed oxycodone    Family History  Problem Relation Age of Onset   Hashimoto's thyroiditis Mother    Eczema Mother    Angioedema Mother    Colonic polyp Mother    Throat cancer Father    Arrhythmia Father    Angioedema Maternal Grandfather    Hypertension Other    Hyperlipidemia Other    Colon cancer Other        grandmother    Review of Systems -  Did not obtain   OBJECTIVE: Temp:  [98.6 F (37 C)-100.6 F (38.1 C)] 98.6 F (37 C) (05/06 2000) Pulse Rate:  [66-144] 66 (05/06 2000) Resp:  [18-30] 18 (05/06 2000) BP: (105-142)/(67-83) 134/82 (05/06 2000) SpO2:  [94 %-100 %] 94 % (05/06 2000) Weight:  [81.6 kg] 81.6 kg (05/06 0607) Did not examine  LABS: Results for orders placed or performed during the  hospital encounter of 05/01/23 (from the past 48 hour(s))  Lactic acid, plasma     Status: None   Collection Time: 05/01/23  6:10 AM  Result Value Ref Range   Lactic Acid, Venous 1.6 0.5 - 1.9 mmol/L    Comment: Performed at [facility], [address] [place], [place] [address]  Comprehensive metabolic panel     Status: Abnormal   Collection Time: 05/01/23  6:10 AM  Result Value Ref Range   Sodium 138 135 - 145 mmol/L   Potassium 3.4 (L) 3.5 - 5.1 mmol/L   Chloride 108 98 - 111 mmol/L   CO2 22 22 - 32 mmol/L   Glucose, Bld 114 (H) 70 - 99 mg/dL    Comment: Glucose reference range applies only to samples taken after fasting for at least 8 hours.   BUN 6 6 - 20 mg/dL   Creatinine, Ser 2.72 0.44 - 1.00 mg/dL   Calcium 9.3 8.9 - 53.6 mg/dL   Total Protein 7.7 6.5 - 8.1 g/dL   Albumin 3.8 3.5 - 5.0 g/dL   AST 42 (H) 15 - 41 U/L   ALT 20 0 - 44 U/L   Alkaline Phosphatase 93 38 - 126 U/L   Total Bilirubin 0.6 0.3 - 1.2 mg/dL   GFR, Estimated >64 >40 mL/min    Comment: (NOTE) Calculated using the CKD-EPI Creatinine Equation (2021)    Anion gap 8 5 - 15    Comment: Performed at [facility], [address] [place], [place] [address]  CBC with Differential     Status: None   Collection Time: 05/01/23  6:10 AM  Result Value Ref Range   WBC 5.7 4.0 - 10.5 K/uL   RBC 4.34 3.87 - 5.11 MIL/uL   Hemoglobin 12.9 12.0 - 15.0 g/dL   HCT 59.5 63.8 - 75.6 %   MCV 88.7 80.0 - 100.0 fL   MCH 29.7 26.0 - 34.0 pg   MCHC 33.5 30.0 - 36.0 g/dL   RDW 43.3 29.5 - 18.8 %   Platelets 219 150 - 400 K/uL   nRBC 0.0 0.0 - 0.2 %   Neutrophils Relative % 67 %   Neutro Abs 3.8 1.7 - 7.7 K/uL   Lymphocytes Relative 23 %   Lymphs Abs 1.3 0.7 - 4.0 K/uL   Monocytes Relative 7 %   Monocytes Absolute 0.4 0.1 - 1.0 K/uL   Eosinophils Relative 2 %   Eosinophils Absolute 0.1 0.0 - 0.5 K/uL   Basophils Relative 1 %   Basophils  Absolute 0.0 0.0 - 0.1 K/uL   Immature Granulocytes 0 %    Abs Immature Granulocytes 0.01 0.00 - 0.07 K/uL    Comment: Performed at [facility], [address] [place], [place] [address]  Urinalysis, Routine w reflex microscopic -Urine, Clean Catch     Status: Abnormal   Collection Time: 05/01/23  6:10 AM  Result Value Ref Range   Color, Urine STRAW (A) YELLOW   APPearance CLEAR (A) CLEAR   Specific Gravity, Urine 1.003 (L) 1.005 - 1.030   pH 8.0 5.0 - 8.0   Glucose, UA NEGATIVE NEGATIVE mg/dL   Hgb urine dipstick NEGATIVE NEGATIVE   Bilirubin Urine NEGATIVE NEGATIVE   Ketones, ur NEGATIVE NEGATIVE mg/dL   Protein, ur NEGATIVE NEGATIVE mg/dL   Nitrite NEGATIVE NEGATIVE   Leukocytes,Ua NEGATIVE NEGATIVE    Comment: Performed at [facility], [address] [place], [place] [address]  Procalcitonin     Status: None   Collection Time: 05/01/23  6:10 AM  Result Value Ref Range   Procalcitonin <0.10 ng/mL    Comment:        Interpretation: PCT (Procalcitonin) <= 0.5 ng/mL: Systemic infection (sepsis) is not likely. Local bacterial infection is possible. (NOTE)       Sepsis PCT Algorithm           Lower Respiratory Tract                                      Infection PCT Algorithm    ----------------------------     ----------------------------         PCT < 0.25 ng/mL                PCT < 0.10 ng/mL          Strongly encourage             Strongly discourage   discontinuation of antibiotics    initiation of antibiotics    ----------------------------     -----------------------------       PCT 0.25 - 0.50 ng/mL            PCT 0.10 - 0.25 ng/mL               OR       >80% decrease in PCT            Discourage initiation of                                            antibiotics      Encourage discontinuation           of antibiotics    ----------------------------     -----------------------------         PCT >= 0.50 ng/mL              PCT 0.26 - 0.50 ng/mL               AND        <80% decrease in PCT              Encourage initiation of  antibiotics       Encourage continuation           of antibiotics    ----------------------------     -----------------------------        PCT >= 0.50 ng/mL                  PCT > 0.50 ng/mL               AND         increase in PCT                  Strongly encourage                                      initiation of antibiotics    Strongly encourage escalation           of antibiotics                                     -----------------------------                                           PCT <= 0.25 ng/mL                                                 OR                                        > 80% decrease in PCT                                      Discontinue / Do not initiate                                             antibiotics  Performed at [facility], [address] [place], [place] [address]   Protime-INR     Status: None   Collection Time: 05/01/23  6:19 AM  Result Value Ref Range   Prothrombin Time 14.4 11.4 - 15.2 seconds   INR 1.1 0.8 - 1.2    Comment: (NOTE) INR goal varies based on device and disease states. Performed at [facility], [address] [place], [place] [address]   SARS Coronavirus 2 by RT PCR (hospital order, performed in [facility] hospital lab) *cepheid single result test* Anterior Nasal Swab     Status: None   Collection Time: 05/01/23  6:30 AM   Specimen: Anterior Nasal Swab  Result Value Ref Range   SARS Coronavirus 2 by RT PCR NEGATIVE NEGATIVE    Comment: (NOTE) SARS-CoV-2 target nucleic acids are NOT DETECTED.  The SARS-CoV-2 RNA is generally detectable in upper and lower respiratory specimens during the acute phase of infection. The lowest concentration of SARS-CoV-2 viral  copies this assay can detect is 250 copies / mL. A negative result does not preclude SARS-CoV-2 infection and should not be used as the sole basis for treatment or  other patient management decisions.  A negative result may occur with improper specimen collection / handling, submission of specimen other than nasopharyngeal swab, presence of viral mutation(s) within the areas targeted by this assay, and inadequate number of viral copies (<250 copies / mL). A negative result must be combined with clinical observations, patient history, and epidemiological information.  Fact Sheet for Patients:   RoadLapTop.co.za  Fact Sheet for Healthcare Providers: http://kim-miller.com/  This test is not yet approved or  cleared by the [place] FDA and has been authorized for detection and/or diagnosis of SARS-CoV-2 by FDA under an Emergency Use Authorization (EUA).  This EUA will remain in effect (meaning this test can be used) for the duration of the COVID-19 declaration under Section 564(b)(1) of the Act, 21 U.S.C. section 360bbb-3(b)(1), unless the authorization is terminated or revoked sooner.  Performed at [facility], [address] [place], [place] [address]   Resp Panel by RT-PCR (Flu A&B, Covid)     Status: None   Collection Time: 05/01/23  6:35 AM  Result Value Ref Range   SARS Coronavirus 2 by RT PCR NEGATIVE NEGATIVE    Comment: (NOTE) SARS-CoV-2 target nucleic acids are NOT DETECTED.  The SARS-CoV-2 RNA is generally detectable in upper respiratory specimens during the acute phase of infection. The lowest concentration of SARS-CoV-2 viral copies this assay can detect is 138 copies/mL. A negative result does not preclude SARS-Cov-2 infection and should not be used as the sole basis for treatment or other patient management decisions. A negative result may occur with  improper specimen collection/handling, submission of specimen other than nasopharyngeal swab, presence of viral mutation(s) within the areas targeted by this assay, and inadequate number of viral copies(<138 copies/mL).  A negative result must be combined with clinical observations, patient history, and epidemiological information. The expected result is Negative.  Fact Sheet for Patients:  BloggerCourse.com  Fact Sheet for Healthcare Providers:  SeriousBroker.it  This test is no t yet approved or cleared by the [place] FDA and  has been authorized for detection and/or diagnosis of SARS-CoV-2 by FDA under an Emergency Use Authorization (EUA). This EUA will remain  in effect (meaning this test can be used) for the duration of the COVID-19 declaration under Section 564(b)(1) of the Act, 21 U.S.C.section 360bbb-3(b)(1), unless the authorization is terminated  or revoked sooner.       Influenza A by PCR NEGATIVE NEGATIVE   Influenza B by PCR NEGATIVE NEGATIVE    Comment: (NOTE) The Xpert Xpress SARS-CoV-2/FLU/RSV plus assay is intended as an aid in the diagnosis of influenza from Nasopharyngeal swab specimens and should not be used as a sole basis for treatment. Nasal washings and aspirates are unacceptable for Xpert Xpress SARS-CoV-2/FLU/RSV testing.  Fact Sheet for Patients: BloggerCourse.com  Fact Sheet for Healthcare Providers: SeriousBroker.it  This test is not yet approved or cleared by the [place] FDA and has been authorized for detection and/or diagnosis of SARS-CoV-2 by FDA under an Emergency Use Authorization (EUA). This EUA will remain in effect (meaning this test can be used) for the duration of the COVID-19 declaration under Section 564(b)(1) of the Act, 21 U.S.C. section 360bbb-3(b)(1), unless the authorization is terminated or revoked.  Performed at [facility], [address], [place], [place] [address]   Magnesium  Status: None   Collection Time: 05/01/23  7:38 AM  Result Value Ref Range   Magnesium 1.7 1.7 - 2.4 mg/dL    Comment: Performed at  [facility], [address] [place], [place] [address]  CSF cell count with differential collection tube #: 1     Status: Abnormal   Collection Time: 05/01/23 10:20 AM  Result Value Ref Range   Tube # 1    Color, CSF COLORLESS COLORLESS   Appearance, CSF CLEAR CLEAR   Supernatant NOT INDICATED    RBC Count, CSF 94 (H) 0 - 3 /cu mm   WBC, CSF 652 (HH) 0 - 5 /cu mm    Comment: CRITICAL RESULT CALLED TO, READ BACK BY AND VERIFIED WITH: Lamount Cohen, RN [phone] 05/01/23 GM    Segmented Neutrophils-CSF 74 %   Lymphs, CSF 16 %   Monocyte-Macrophage-Spinal Fluid 10 %   Eosinophils, CSF 0 %    Comment: Performed at [facility], [address] [place], [place] [address]  CSF cell count with differential collection tube #: 4     Status: Abnormal   Collection Time: 05/01/23 10:20 AM  Result Value Ref Range   Tube # 4    Color, CSF COLORLESS COLORLESS   Appearance, CSF CLEAR CLEAR   Supernatant NOT INDICATED    RBC Count, CSF 25 (H) 0 - 3 /cu mm   WBC, CSF 632 (HH) 0 - 5 /cu mm    Comment: CRITICAL RESULT CALLED TO, READ BACK BY AND VERIFIED WITH: Lamount Cohen, RN [phone] 05/01/23 GM    Segmented Neutrophils-CSF 82 %   Lymphs, CSF 11 %   Monocyte-Macrophage-Spinal Fluid 6 %   Eosinophils, CSF 1 %    Comment: Performed at [facility], [address] [place], [place] [address]  CSF culture w Gram Stain     Status: None (Preliminary result)   Collection Time: 05/01/23 10:20 AM   Specimen: CSF; Cerebrospinal Fluid  Result Value Ref Range   Specimen Description CSF    Special Requests NONE    Gram Stain      WBC SEEN NO RBC SEEN NO ORGANISMS SEEN Performed at [facility], [address], [place], [place] [address]    Culture PENDING    Report Status PENDING   Protein and glucose, CSF     Status: Abnormal   Collection Time: 05/01/23 10:20 AM  Result Value Ref Range   Glucose, CSF 53 40 - 70 mg/dL   Total  Protein, CSF 76 (H) 15 - 45 mg/dL     Comment: Performed at [facility], [address], [place], [place] [address]  Pathologist smear review     Status: None   Collection Time: 05/01/23 10:20 AM  Result Value Ref Range   Path Review Cytospin of tube 4 CSF is reviewed.     Comment: Immunocompromised patient with headache. Abundant neutrophils are present concerning for bacterial meningitis. CSF cultures are pending. Reviewed by Georgiann Cocker Oneita Kras, M.D. Performed at [facility], [address] [place], [place] [address]   Pathologist smear review     Status: None   Collection Time: 05/01/23 10:20 AM  Result Value Ref Range   Path Review Cytospin of tube 1 CSF reviewed.     Comment: Immunocompromised patient with headache. Abundant neutrophils present concerning for bacterial menigitis. CSF cultures are pending. Reviewed by Georgiann Cocker Oneita Kras, M.D. Performed at [facility], [address], [place], [place]  [address]   Meningitis/Encephalitis Panel (CSF)     Status: None   Collection Time: 05/01/23 10:20 AM  Result Value Ref Range   Cryptococcus neoformans/gattii (CSF) NOT DETECTED NOT DETECTED    Comment: (NOTE) Patients with a suspicion of cryptococcal meningitis should be tested  for cryptococcal antigen (CrAg).      Cytomegalovirus (CSF) NOT DETECTED NOT DETECTED   Enterovirus (CSF) NOT DETECTED NOT DETECTED   Escherichia coli K1 (CSF) NOT DETECTED NOT DETECTED    Comment: (NOTE) Only E. coli strains possessing the K1 capsular antigen will be detected.      Haemophilus influenzae (CSF) NOT DETECTED NOT DETECTED   Herpes simplex virus 1 (CSF) NOT DETECTED NOT DETECTED   Herpes simplex virus 2 (CSF) NOT DETECTED NOT DETECTED   Human herpesvirus 6 (CSF) NOT DETECTED NOT DETECTED   Human parechovirus (CSF) NOT DETECTED NOT DETECTED   Listeria monocytogenes (CSF) NOT DETECTED NOT DETECTED   Neisseria meningitis (CSF) NOT DETECTED NOT DETECTED    Comment: (NOTE) Only encapsulated  strains of N. meningitidis will be detected.     Streptococcus agalactiae (CSF) NOT DETECTED NOT DETECTED   Streptococcus pneumoniae (CSF) NOT DETECTED NOT DETECTED   Varicella zoster virus (CSF) NOT DETECTED NOT DETECTED    Comment: Performed at [facility], [address], [place], [place] [address]  Respiratory (~20 pathogens) panel by PCR     Status: None   Collection Time: 05/01/23  2:09 PM   Specimen: Nasopharyngeal Swab; Respiratory  Result Value Ref Range   Adenovirus NOT DETECTED NOT DETECTED   Coronavirus 229E NOT DETECTED NOT DETECTED    Comment: (NOTE) The Coronavirus on the Respiratory Panel, DOES NOT test for the novel  Coronavirus (2019 nCoV)    Coronavirus HKU1 NOT DETECTED NOT DETECTED   Coronavirus NL63 NOT DETECTED NOT DETECTED   Coronavirus OC43 NOT DETECTED NOT DETECTED   Metapneumovirus NOT DETECTED NOT DETECTED   Rhinovirus / Enterovirus NOT DETECTED NOT DETECTED   Influenza A NOT DETECTED NOT DETECTED   Influenza B NOT DETECTED NOT DETECTED   Parainfluenza Virus 1 NOT DETECTED NOT DETECTED   Parainfluenza Virus 2 NOT DETECTED NOT DETECTED   Parainfluenza Virus 3 NOT DETECTED NOT DETECTED   Parainfluenza Virus 4 NOT DETECTED NOT DETECTED   Respiratory Syncytial Virus NOT DETECTED NOT DETECTED   Bordetella pertussis NOT DETECTED NOT DETECTED   Bordetella Parapertussis NOT DETECTED NOT DETECTED   Chlamydophila pneumoniae NOT DETECTED NOT DETECTED   Mycoplasma pneumoniae NOT DETECTED NOT DETECTED    Comment: Performed at [facility], [address], [place], [place] [address]    MICRO:  IMAGING: CT Angio Chest PE W and/or Wo Contrast  Result Date: 05/01/2023 CLINICAL DATA:  Headache and fever since yesterday. EXAM: CT ANGIOGRAPHY CHEST WITH CONTRAST TECHNIQUE: Multidetector CT imaging of the chest was performed using the standard protocol during bolus administration of intravenous contrast. Multiplanar CT image reconstructions and MIPs were  obtained to evaluate the vascular anatomy. RADIATION DOSE REDUCTION: This exam was performed according to the departmental dose-optimization program which includes automated exposure control, adjustment of the mA and/or kV according to patient size and/or use of iterative reconstruction technique. CONTRAST:  75mL OMNIPAQUE IOHEXOL 350 MG/ML SOLN COMPARISON:  Chest radiograph performed earlier on the same date and CT examination dated December 09, 2022 FINDINGS: Cardiovascular: Satisfactory opacification of the pulmonary arteries to the segmental level. No evidence of pulmonary embolism. Normal heart size. No pericardial effusion. Mediastinum/Nodes: No  enlarged mediastinal, hilar, or axillary lymph nodes. Thyroid gland, trachea, and esophagus demonstrate no significant findings. Lungs/Pleura: Lungs are clear. No pleural effusion or pneumothorax. Upper Abdomen: No acute abnormality. Musculoskeletal: No chest wall abnormality. No acute or significant osseous findings. Review of the MIP images confirms the above findings. IMPRESSION: No evidence of pulmonary embolism or acute intrathoracic process. Electronically Signed   By: Larose Hires D.O.   On: 05/01/2023 08:29   CT HEAD WO CONTRAST ( )  Result Date: 05/01/2023 CLINICAL DATA:  Provided history: Headache, fever. EXAM: CT HEAD WITHOUT CONTRAST TECHNIQUE: Contiguous axial images were obtained from the base of the skull through the vertex without intravenous contrast. RADIATION DOSE REDUCTION: This exam was performed according to the departmental dose-optimization program which includes automated exposure control, adjustment of the mA and/or kV according to patient size and/or use of iterative reconstruction technique. COMPARISON:  CT examinations 10/11/2022 and earlier. FINDINGS: Brain: Cerebral volume is normal. Redemonstrated small chronic hypodensity within the left parietal white matter, favored to reflect a prominent perivascular space. There is no acute  intracranial hemorrhage. No demarcated cortical infarct. No extra-axial fluid collection. No evidence of an intracranial mass. No midline shift. Vascular: No hyperdense vessel. Atherosclerotic calcifications. Skull: No fracture or aggressive osseous lesion. Sinuses/Orbits: No mass or acute finding within the imaged orbits. Minimal mucosal thickening within the inferior left frontal sinus and within a right ethmoid air cell. Otherwise, no significant phlegm a tori paranasal sinus disease at the imaged levels. IMPRESSION: 1. No evidence of an acute intracranial abnormality. 2. Minimal mucosal thickening within the left frontal and right ethmoid sinuses. Electronically Signed   By: Jackey Loge D.O.   On: 05/01/2023 08:20   DG Chest Port 1 View  Result Date: 05/01/2023 CLINICAL DATA:  [id number] with sepsis, headache and fever. EXAM: PORTABLE CHEST 1 VIEW COMPARISON:  PA Lat 12/08/2022, CTA chest 12/09/2022 FINDINGS: The heart size and mediastinal contours are within normal limits. A loop recorder device is implanted in the left chest wall as before. Both lungs are clear with slightly elevated right hemidiaphragm. The visualized skeletal structures are unremarkable. IMPRESSION: No active disease.  Stable chest. Electronically Signed   By: Almira Bar M.D.   On: 05/01/2023 07:32     Assessment/Plan:  47yo F who is an immunocompromised host, on monthly IVIG admitted for fevers, respiratory symptoms in addition to worsening headache concerning for meningitis given CSF findings. Her respiratory symptom work up included chest CT-A ruled out PE and parenchymal disease, only mild sinusitis as possible source of meningitis. NCHCT unrevealing. Currently on vanco/cefepime/acyclovir  - given she is immunocompromised. Recommend to add ampicillin -await for results of culture/and PCR to determine how to de-escalate - if csf pcr for HSV is negative, would d/c acyclovir - fungal meningitis also in the picture given that  she is an immunocompromised host. Recommend to get mri of brain - formal consult to follow tomorrow with dr Rivka Safer

## 2023-05-01 NOTE — Consult Note (Signed)
Pharmacy Antiviral Note  [name] Horn is a 47 y.o. female admitted on 05/01/2023 with meningitis.  Pharmacy has been consulted for acyclovir dosing.  Plan: Start Acyclovir 10 mg/kg (655 mg) IV every 8 hours Dosed using adjusted body weight since total body weight greater than 140% of ideal body weight Follow renal function and cultures for adjustments  Height: 5\' 4"  (162.6 cm) Weight: 81.6 kg (180 lb) IBW/kg (Calculated) : 54.7  Temp (24hrs), Avg:100.4 F (38 C), Min:100.2 F (37.9 C), Max:100.6 F (38.1 C)  Recent Labs  Lab 05/01/23 0610  WBC 5.7  CREATININE 0.90  LATICACIDVEN 1.6    Estimated Creatinine Clearance: 79 mL/min (by C-G formula based on SCr of 0.9 mg/dL).    Allergies  Allergen Reactions   Cyanoacrylate Hives   Other Itching and Rash    Dermabond surgical glue.  Dermabond surgical glue-blisters   Sulfa Antibiotics Anaphylaxis, Rash, Shortness Of Breath and Swelling    Angioedema (also)   Sulfonamide Derivatives Hives, Shortness Of Breath and Swelling    TONGUE SWELLS   Benadryl [Diphenhydramine Hcl] Other (See Comments)    Hyperactivity    Sulfamethoxazole     Other Reaction(s): Angioedema   Diphenhydramine    Diphenhydramine Hcl     Other Reaction(s): Other (See Comments)  HYPERACTIVITY, RESTLESS LEG   Silicone Rash    Watch band    Antimicrobials this admission: vancomycin x 1 5/6 in ED Ceftriaxone x 1 5/6 in ED Acyclovir 5/6>>  Dose adjustments this admission: N/A  Microbiology results: 5/6 BCx: pending 5/6 CSF: pending    Thank you for allowing pharmacy to be a part of this patient's care.  Barrie Folk, PharmD 05/01/2023 11:28 AM

## 2023-05-01 NOTE — ED Notes (Signed)
Pt to CT

## 2023-05-01 NOTE — ED Notes (Signed)
Provider at bedside for LP.

## 2023-05-01 NOTE — ED Notes (Signed)
Dr Clyde Lundborg notified of CSF criticals

## 2023-05-01 NOTE — ED Notes (Signed)
Pt got up to urinate. Pt experienced worse HA with standing up. Pt back in bed. Provider at bedside explaining plan of care. Pt continues to be tachycardic and tachypneic with mild pallor and diaphoresis.

## 2023-05-01 NOTE — ED Triage Notes (Addendum)
Pt arrived via POV with reports of HA and fever since yesterday, tmax at home 103. Taking tylenol and advil, pt states she takes a pain medication at home with tylenol in it also.  Pt states she has hx of migraines, feels a little similar, states on Friday she had IV antibody infusion as well.  Pt c/o nausea also.   Last had advil about 2 hours prior to arrival.  Pt wearing mask on arrival.

## 2023-05-02 ENCOUNTER — Encounter: Payer: Self-pay | Admitting: Internal Medicine

## 2023-05-02 DIAGNOSIS — M059 Rheumatoid arthritis with rheumatoid factor, unspecified: Secondary | ICD-10-CM | POA: Diagnosis not present

## 2023-05-02 DIAGNOSIS — E669 Obesity, unspecified: Secondary | ICD-10-CM | POA: Diagnosis not present

## 2023-05-02 DIAGNOSIS — G030 Nonpyogenic meningitis: Secondary | ICD-10-CM | POA: Diagnosis not present

## 2023-05-02 DIAGNOSIS — K200 Eosinophilic esophagitis: Secondary | ICD-10-CM

## 2023-05-02 DIAGNOSIS — G039 Meningitis, unspecified: Secondary | ICD-10-CM | POA: Diagnosis not present

## 2023-05-02 DIAGNOSIS — G43909 Migraine, unspecified, not intractable, without status migrainosus: Secondary | ICD-10-CM | POA: Diagnosis not present

## 2023-05-02 DIAGNOSIS — D809 Immunodeficiency with predominantly antibody defects, unspecified: Secondary | ICD-10-CM

## 2023-05-02 DIAGNOSIS — D806 Antibody deficiency with near-normal immunoglobulins or with hyperimmunoglobulinemia: Secondary | ICD-10-CM | POA: Diagnosis not present

## 2023-05-02 LAB — CBC
HCT: 34.2 % — ABNORMAL LOW (ref 36.0–46.0)
Hemoglobin: 11.7 g/dL — ABNORMAL LOW (ref 12.0–15.0)
MCH: 30 pg (ref 26.0–34.0)
MCHC: 34.2 g/dL (ref 30.0–36.0)
MCV: 87.7 fL (ref 80.0–100.0)
Platelets: 208 10*3/uL (ref 150–400)
RBC: 3.9 MIL/uL (ref 3.87–5.11)
RDW: 13.9 % (ref 11.5–15.5)
WBC: 8.2 10*3/uL (ref 4.0–10.5)
nRBC: 0 % (ref 0.0–0.2)

## 2023-05-02 LAB — BASIC METABOLIC PANEL
Anion gap: 6 (ref 5–15)
BUN: 8 mg/dL (ref 6–20)
CO2: 17 mmol/L — ABNORMAL LOW (ref 22–32)
Calcium: 9.1 mg/dL (ref 8.9–10.3)
Chloride: 118 mmol/L — ABNORMAL HIGH (ref 98–111)
Creatinine, Ser: 0.72 mg/dL (ref 0.44–1.00)
GFR, Estimated: >60 mL/min (ref >60)
Glucose, Bld: 126 mg/dL — ABNORMAL HIGH (ref 70–99)
Potassium: 4.5 mmol/L (ref 3.5–5.1)
Sodium: 141 mmol/L (ref 135–145)

## 2023-05-02 LAB — CULTURE, BLOOD (ROUTINE X 2)

## 2023-05-02 LAB — CSF CULTURE W GRAM STAIN: Culture: NO GROWTH

## 2023-05-02 LAB — HSV 1/2 PCR, CSF
HSV-1 DNA: NEGATIVE
HSV-2 DNA: NEGATIVE

## 2023-05-02 NOTE — ED Notes (Signed)
Assumed care from Abbey, RN. Pt resting comfortably in bed at this time. Pt denies any current needs or questions. Call light with in reach.   

## 2023-05-02 NOTE — Progress Notes (Signed)
PROGRESS NOTE     HPI was taken from Dr. Clyde Lundborg: [name] Horn is a 47 y.o. 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 difficile colitis, rheumatoid arthritis, herpes labialis, gastroparesis, left leg DVT 2011 not on anticoagulants, who presents with headache, fever and chills.   Patient states that she started having worsening headache, fever, chills 2 days ago.  She had temperature of 103 at home.  The headache is located in the occipital area, constant, aching, nonradiating.  She has mild photophobia and posterior neck tightness.  Patient has dry cough, shortness of breath.  She had some chest discomfort recently, but not today.  Patient has nausea, no vomiting.  Patient had 2 episode of diarrhea 3 days ago which has resolved.  Today no active diarrhea.  She also has intermittent abdominal pain which she attributes to gastroparesis.  Patient has urinary urgency and burning on urination, no dysuria or hematuria.   Lumbar puncture was done by EDP, initial CSF analysis showed WBC 635, 623 with segment 74, 82, red blood cell 94, 25, lymphocytes 16, 16, protein 76, 76, glucose 53, 53.     Data reviewed independently and ED Course: pt was found to have WBC 5.7, potassium 3.4, lactic acid 1.6, negative urinalysis, procalcitonin<0.10, GFR> 60, negative COVID PCR.  Chest x-ray negative.  CT head negative.  CTA negative for PE.  Patient is admitted to PCU as inpatient.  Consulted Dr. Drue Second of ID  Keitha Butte YSAMAR KONO  ZOX:[id number] DOB: 12/04/1976 DOA: 05/01/2023 PCP: [facility]   Assessment & Plan:   Active Problems:   Meningitis   Sepsis (HCC)   Antibody deficiency syndrome (HCC)   RA (rheumatoid arthritis) (HCC)   HLD (hyperlipidemia)   History of asthma   Migraine   Eosinophilic esophagitis    Hypokalemia   Oral thrush   Obesity with body mass index (BMI) of 30.0 to 39.9  Assessment and Plan: Sepsis: met criteria w/ fever, tachycardia, tachypnea & likely secondary to meningitis. S/p LP in ED. Lactic acid is normal 1.6. Continue on IV vanco, cefepime, ampicillin as per ID. S/p IV steroid x 1 in the ED. Continue on IVFs. Continue on droplet precautions. Procal <0.10. ID following and recs apprec    Antibody deficiency syndrome: receiving IVIG as an outpatient.    RA: continue to hold home dose of methotrexate    HLD: continue on statin   Hx of asthma: unknown severity and/or stage. W/o exacerbation. Bronchodilators prn    Migraine: sumatriptan prn    GERD: continue on pepcid, PPI    Hypokalemia: WNL    Oral thrush: continue on fluconazole    Obesity: BMI 30.9. Would benefit from weight loss         DVT prophylaxis: SCDs Code Status: full  Family Communication: discussed pt's care w/ pt's mother, Darl Pikes, and answered her questions  Disposition Plan: likely d/c back home   Status is: Inpatient Remains inpatient appropriate because: severity of illness    Level of care: Progressive   Consultants:  ID  Procedures:   Antimicrobials: ampicillin, vanco, cefepime, acyclovir (anti-viral)    Subjective: Pt c/o bad headache   Objective: Vitals:   05/01/23 2000 05/01/23 2300 05/02/23 0200 05/02/23 0500  BP: 134/82 138/80 133/89 138/85  Pulse: 66 72 81 77  Resp: 18 18 16 18   Temp: 98.6 F (37 C)  99.1 F (37.3 C)  TempSrc:      SpO2: 94% 96% 95% 95%  Weight:      Height:        Intake/Output Summary (Last 24 hours) at 05/02/2023 0851 Last data filed at 05/02/2023 0424 Gross per 24 hour  Intake 3183.36 ml  Output --  Net 3183.36 ml   Filed Weights   05/01/23 0607  Weight: 81.6 kg    Examination:  General exam: Appears calm and comfortable  Respiratory system: Clear to auscultation. Respiratory effort normal. Cardiovascular system: S1 & S2+.  No  rubs, gallops or clicks.  Gastrointestinal system: Abdomen is obese, soft and nontender.  Normal bowel sounds heard. Central nervous system: Alert and oriented. Moves all extremities Psychiatry: Judgement and insight appear normal. Flat mood and affect     Data Reviewed: I have personally reviewed following labs and imaging studies  CBC: Recent Labs  Lab 05/01/23 0610 05/02/23 0407  WBC 5.7 8.2  NEUTROABS 3.8  --   HGB 12.9 11.7*  HCT 38.5 34.2*  MCV 88.7 87.7  PLT 219 208   Basic Metabolic Panel: Recent Labs  Lab 05/01/23 0610 05/01/23 0738 05/02/23 0407  NA 138  --  141  K 3.4*  --  4.5  CL 108  --  118*  CO2 22  --  17*  GLUCOSE 114*  --  126*  BUN 6  --  8  CREATININE 0.90  --  0.72  CALCIUM 9.3  --  9.1  MG  --  1.7  --    GFR: Estimated Creatinine Clearance: 88.9 mL/min (by C-G formula based on SCr of 0.72 mg/dL). Liver Function Tests: Recent Labs  Lab 05/01/23 0610  AST 42*  ALT 20  ALKPHOS 93  BILITOT 0.6  PROT 7.7  ALBUMIN 3.8   No results for input(s): "LIPASE", "AMYLASE" in the last 168 hours. No results for input(s): "AMMONIA" in the last 168 hours. Coagulation Profile: Recent Labs  Lab 05/01/23 0619  INR 1.1   Cardiac Enzymes: No results for input(s): "CKTOTAL", "CKMB", "CKMBINDEX", "TROPONINI" in the last 168 hours. BNP (last 3 results) No results for input(s): "PROBNP" in the last 8760 hours. HbA1C: No results for input(s): "HGBA1C" in the last 72 hours. CBG: No results for input(s): "GLUCAP" in the last 168 hours. Lipid Profile: No results for input(s): "CHOL", "HDL", "LDLCALC", "TRIG", "CHOLHDL", "LDLDIRECT" in the last 72 hours. Thyroid Function Tests: No results for input(s): "TSH", "T4TOTAL", "FREET4", "T3FREE", "THYROIDAB" in the last 72 hours. Anemia Panel: No results for input(s): "VITAMINB12", "FOLATE", "FERRITIN", "TIBC", "IRON", "RETICCTPCT" in the last 72 hours. Sepsis Labs: Recent Labs  Lab 05/01/23 0610   PROCALCITON <0.10  LATICACIDVEN 1.6    Recent Results (from the past 240 hour(s))  SARS Coronavirus 2 by RT PCR (hospital order, performed in [facility] hospital lab) *cepheid single result test* Anterior Nasal Swab     Status: None   Collection Time: 05/01/23  6:30 AM   Specimen: Anterior Nasal Swab  Result Value Ref Range Status   SARS Coronavirus 2 by RT PCR NEGATIVE NEGATIVE Final    Comment: (NOTE) SARS-CoV-2 target nucleic acids are NOT DETECTED.  The SARS-CoV-2 RNA is generally detectable in upper and lower respiratory specimens during the acute phase of infection. The lowest concentration of SARS-CoV-2 viral copies this assay can detect is 250 copies / mL. A negative result does not preclude SARS-CoV-2 infection and should not be used as the sole basis for treatment  or other patient management decisions.  A negative result may occur with improper specimen collection / handling, submission of specimen other than nasopharyngeal swab, presence of viral mutation(s) within the areas targeted by this assay, and inadequate number of viral copies (<250 copies / mL). A negative result must be combined with clinical observations, patient history, and epidemiological information.  Fact Sheet for Patients:   RoadLapTop.co.za  Fact Sheet for Healthcare Providers: http://kim-miller.com/  This test is not yet approved or  cleared by the [place] FDA and has been authorized for detection and/or diagnosis of SARS-CoV-2 by FDA under an Emergency Use Authorization (EUA).  This EUA will remain in effect (meaning this test can be used) for the duration of the COVID-19 declaration under Section 564(b)(1) of the Act, 21 U.S.C. section 360bbb-3(b)(1), unless the authorization is terminated or revoked sooner.  Performed at [facility], [address] [place], [place] [address]   Resp Panel by RT-PCR (Flu A&B, Covid)     Status:  None   Collection Time: 05/01/23  6:35 AM  Result Value Ref Range Status   SARS Coronavirus 2 by RT PCR NEGATIVE NEGATIVE Final    Comment: (NOTE) SARS-CoV-2 target nucleic acids are NOT DETECTED.  The SARS-CoV-2 RNA is generally detectable in upper respiratory specimens during the acute phase of infection. The lowest concentration of SARS-CoV-2 viral copies this assay can detect is 138 copies/mL. A negative result does not preclude SARS-Cov-2 infection and should not be used as the sole basis for treatment or other patient management decisions. A negative result may occur with  improper specimen collection/handling, submission of specimen other than nasopharyngeal swab, presence of viral mutation(s) within the areas targeted by this assay, and inadequate number of viral copies(<138 copies/mL). A negative result must be combined with clinical observations, patient history, and epidemiological information. The expected result is Negative.  Fact Sheet for Patients:  BloggerCourse.com  Fact Sheet for Healthcare Providers:  SeriousBroker.it  This test is no t yet approved or cleared by the [place] FDA and  has been authorized for detection and/or diagnosis of SARS-CoV-2 by FDA under an Emergency Use Authorization (EUA). This EUA will remain  in effect (meaning this test can be used) for the duration of the COVID-19 declaration under Section 564(b)(1) of the Act, 21 U.S.C.section 360bbb-3(b)(1), unless the authorization is terminated  or revoked sooner.       Influenza A by PCR NEGATIVE NEGATIVE Final   Influenza B by PCR NEGATIVE NEGATIVE Final    Comment: (NOTE) The Xpert Xpress SARS-CoV-2/FLU/RSV plus assay is intended as an aid in the diagnosis of influenza from Nasopharyngeal swab specimens and should not be used as a sole basis for treatment. Nasal washings and aspirates are unacceptable for Xpert Xpress  SARS-CoV-2/FLU/RSV testing.  Fact Sheet for Patients: BloggerCourse.com  Fact Sheet for Healthcare Providers: SeriousBroker.it  This test is not yet approved or cleared by the [place] FDA and has been authorized for detection and/or diagnosis of SARS-CoV-2 by FDA under an Emergency Use Authorization (EUA). This EUA will remain in effect (meaning this test can be used) for the duration of the COVID-19 declaration under Section 564(b)(1) of the Act, 21 U.S.C. section 360bbb-3(b)(1), unless the authorization is terminated or revoked.  Performed at [facility], [address] [place], [place] [address]   CSF culture w Gram Stain     Status: None (Preliminary result)   Collection Time: 05/01/23 10:20 AM   Specimen: CSF; Cerebrospinal Fluid  Result Value Ref Range Status   Specimen Description CSF  Final   Special Requests NONE  Final   Gram Stain   Final    WBC SEEN NO RBC SEEN NO ORGANISMS SEEN Performed at [facility], [address] [place], [place] [address]    Culture PENDING  Incomplete   Report Status PENDING  Incomplete  Respiratory (~20 pathogens) panel by PCR     Status: None   Collection Time: 05/01/23  2:09 PM   Specimen: Nasopharyngeal Swab; Respiratory  Result Value Ref Range Status   Adenovirus NOT DETECTED NOT DETECTED Final   Coronavirus 229E NOT DETECTED NOT DETECTED Final    Comment: (NOTE) The Coronavirus on the Respiratory Panel, DOES NOT test for the novel  Coronavirus (2019 nCoV)    Coronavirus HKU1 NOT DETECTED NOT DETECTED Final   Coronavirus NL63 NOT DETECTED NOT DETECTED Final   Coronavirus OC43 NOT DETECTED NOT DETECTED Final   Metapneumovirus NOT DETECTED NOT DETECTED Final   Rhinovirus / Enterovirus NOT DETECTED NOT DETECTED Final   Influenza A NOT DETECTED NOT DETECTED Final   Influenza B NOT DETECTED NOT DETECTED Final   Parainfluenza Virus 1 NOT DETECTED NOT  DETECTED Final   Parainfluenza Virus 2 NOT DETECTED NOT DETECTED Final   Parainfluenza Virus 3 NOT DETECTED NOT DETECTED Final   Parainfluenza Virus 4 NOT DETECTED NOT DETECTED Final   Respiratory Syncytial Virus NOT DETECTED NOT DETECTED Final   Bordetella pertussis NOT DETECTED NOT DETECTED Final   Bordetella Parapertussis NOT DETECTED NOT DETECTED Final   Chlamydophila pneumoniae NOT DETECTED NOT DETECTED Final   Mycoplasma pneumoniae NOT DETECTED NOT DETECTED Final    Comment: Performed at [facility], [address] [place], [place] [address]         Radiology Studies: CT Angio Chest PE W and/or Wo Contrast  Result Date: 05/01/2023 CLINICAL DATA:  Headache and fever since yesterday. EXAM: CT ANGIOGRAPHY CHEST WITH CONTRAST TECHNIQUE: Multidetector CT imaging of the chest was performed using the standard protocol during bolus administration of intravenous contrast. Multiplanar CT image reconstructions and MIPs were obtained to evaluate the vascular anatomy. RADIATION DOSE REDUCTION: This exam was performed according to the departmental dose-optimization program which includes automated exposure control, adjustment of the mA and/or kV according to patient size and/or use of iterative reconstruction technique. CONTRAST:  75mL OMNIPAQUE IOHEXOL 350 MG/ML SOLN COMPARISON:  Chest radiograph performed earlier on the same date and CT examination dated December 09, 2022 FINDINGS: Cardiovascular: Satisfactory opacification of the pulmonary arteries to the segmental level. No evidence of pulmonary embolism. Normal heart size. No pericardial effusion. Mediastinum/Nodes: No enlarged mediastinal, hilar, or axillary lymph nodes. Thyroid gland, trachea, and esophagus demonstrate no significant findings. Lungs/Pleura: Lungs are clear. No pleural effusion or pneumothorax. Upper Abdomen: No acute abnormality. Musculoskeletal: No chest wall abnormality. No acute or significant osseous findings. Review of  the MIP images confirms the above findings. IMPRESSION: No evidence of pulmonary embolism or acute intrathoracic process. Electronically Signed   By: Larose Hires D.O.   On: 05/01/2023 08:29   CT HEAD WO CONTRAST ( )  Result Date: 05/01/2023 CLINICAL DATA:  Provided history: Headache, fever. EXAM: CT HEAD WITHOUT CONTRAST TECHNIQUE: Contiguous axial images were obtained from the base of the skull through the vertex without intravenous contrast. RADIATION DOSE REDUCTION: This exam was performed according to the departmental dose-optimization program which includes automated exposure control, adjustment of the mA and/or kV according to patient size and/or use  of iterative reconstruction technique. COMPARISON:  CT examinations 10/11/2022 and earlier. FINDINGS: Brain: Cerebral volume is normal. Redemonstrated small chronic hypodensity within the left parietal white matter, favored to reflect a prominent perivascular space. There is no acute intracranial hemorrhage. No demarcated cortical infarct. No extra-axial fluid collection. No evidence of an intracranial mass. No midline shift. Vascular: No hyperdense vessel. Atherosclerotic calcifications. Skull: No fracture or aggressive osseous lesion. Sinuses/Orbits: No mass or acute finding within the imaged orbits. Minimal mucosal thickening within the inferior left frontal sinus and within a right ethmoid air cell. Otherwise, no significant phlegm a tori paranasal sinus disease at the imaged levels. IMPRESSION: 1. No evidence of an acute intracranial abnormality. 2. Minimal mucosal thickening within the left frontal and right ethmoid sinuses. Electronically Signed   By: Jackey Loge D.O.   On: 05/01/2023 08:20   DG Chest Port 1 View  Result Date: 05/01/2023 CLINICAL DATA:  [id number] with sepsis, headache and fever. EXAM: PORTABLE CHEST 1 VIEW COMPARISON:  PA Lat 12/08/2022, CTA chest 12/09/2022 FINDINGS: The heart size and mediastinal contours are within normal limits.  A loop recorder device is implanted in the left chest wall as before. Both lungs are clear with slightly elevated right hemidiaphragm. The visualized skeletal structures are unremarkable. IMPRESSION: No active disease.  Stable chest. Electronically Signed   By: Almira Bar M.D.   On: 05/01/2023 07:32        Scheduled Meds:  amphetamine-dextroamphetamine  20 mg Oral TID   baclofen  10 mg Oral QHS   cycloSPORINE  1 drop Both Eyes BID   famotidine  40 mg Oral QHS   fluconazole  300 mg Oral Q M,W,F   folic acid  2 mg Oral Daily   mometasone-formoterol  2 puff Inhalation BID   olopatadine  1 drop Both Eyes BID   pantoprazole  40 mg Oral BID   polyvinyl alcohol  1 drop Both Eyes BID   pravastatin  40 mg Oral QHS   saccharomyces boulardii  250 mg Oral BID   topiramate  100 mg Oral Daily   topiramate  25 mg Oral QHS   Continuous Infusions:  sodium chloride 75 mL/hr at 05/01/23 1458   acyclovir Stopped (05/02/23 0623)   ampicillin (OMNIPEN) IV Stopped (05/02/23 0424)   ceFEPime (MAXIPIME) IV Stopped (05/02/23 0708)   vancomycin Stopped (05/02/23 0124)     LOS: 1 day    Time spent: 35 mins     Charise Killian, MD [facility] Pager [phone]  If 7PM-7AM, please contact night-coverage [email or web address] 05/02/2023, 8:51 AM

## 2023-05-03 ENCOUNTER — Encounter: Payer: Self-pay | Admitting: Internal Medicine

## 2023-05-03 ENCOUNTER — Inpatient Hospital Stay: Payer: BC Managed Care – PPO

## 2023-05-03 DIAGNOSIS — A419 Sepsis, unspecified organism: Principal | ICD-10-CM

## 2023-05-03 DIAGNOSIS — G039 Meningitis, unspecified: Secondary | ICD-10-CM | POA: Diagnosis not present

## 2023-05-03 LAB — BASIC METABOLIC PANEL
Anion gap: 7 (ref 5–15)
BUN: 9 mg/dL (ref 6–20)
CO2: 22 mmol/L (ref 22–32)
Calcium: 9 mg/dL (ref 8.9–10.3)
Chloride: 115 mmol/L — ABNORMAL HIGH (ref 98–111)
Creatinine, Ser: 0.78 mg/dL (ref 0.44–1.00)
GFR, Estimated: >60 mL/min (ref >60)
Glucose, Bld: 114 mg/dL — ABNORMAL HIGH (ref 70–99)
Potassium: 3.6 mmol/L (ref 3.5–5.1)
Sodium: 144 mmol/L (ref 135–145)

## 2023-05-03 LAB — CBC
HCT: 36.7 % (ref 36.0–46.0)
Hemoglobin: 12.1 g/dL (ref 12.0–15.0)
MCH: 29.7 pg (ref 26.0–34.0)
MCHC: 33 g/dL (ref 30.0–36.0)
MCV: 90.2 fL (ref 80.0–100.0)
Platelets: 214 10*3/uL (ref 150–400)
RBC: 4.07 MIL/uL (ref 3.87–5.11)
RDW: 14.8 % (ref 11.5–15.5)
WBC: 8 10*3/uL (ref 4.0–10.5)
nRBC: 0 % (ref 0.0–0.2)

## 2023-05-03 LAB — CSF CULTURE W GRAM STAIN

## 2023-05-03 MED ORDER — GADOBUTROL 1 MMOL/ML IV SOLN
8.0000 mL | Freq: Once | INTRAVENOUS | Status: AC | PRN
  Administered 2023-05-03: 8 mL via INTRAVENOUS

## 2023-05-03 MED ORDER — SODIUM CHLORIDE 0.9 % IV SOLN: INTRAVENOUS | Status: DC

## 2023-05-03 MED ORDER — METOPROLOL SUCCINATE ER 50 MG PO TB24
50.0000 mg | ORAL_TABLET | Freq: Every day | ORAL | Status: DC
  Administered 2023-05-03 – 2023-05-06 (×4): 50 mg via ORAL
  Filled 2023-05-03 (×4): qty 1

## 2023-05-03 NOTE — Consult Note (Signed)
NAME: [name] Horn  DOB: 1976/09/10  MRN: [id number]  Date/Time: 05/03/2023 2:09 AM  REQUESTING PROVIDER: Dr.Niu Subjective:  REASON FOR CONSULT: meningitis ? [name] Horn is a 47 y.o. 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 She thought it was migraine and took sumatriptan X2. She also had developed flashing lights like aura. She had a temp of 101. As the headaches were not getting better and seem to be worse than her usual migraine she came to the ED  05/01/23  BP 138/80  Temp 98.6 F (37 C)  Pulse Rate 72  Resp 18  SpO2 96 %     Latest Reference Range & Units 05/01/23  WBC 4.0 - 10.5 K/uL 5.7  Hemoglobin 12.0 - 15.0 g/dL 46.9  HCT 62.9 - 52.8 % 38.5  Platelets 150 - 400 K/uL 219  Creatinine 0.44 - 1.00 mg/dL 4.13   She had LP and csf showed 652 WBC ( 74% N) protein of 76 , blood glucose of 53 She was started on multiple antibiotics and acyclovir for meningitis I am seeing her for the same She is doing better today Headache better No sick contacts No travel No steroid use No swimming No Netti pot use No animal bites or scratches   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   Herpes labialis 08/12/2020   IBD (inflammatory bowel disease)     Intertrigo 06/22/2020   Leukopenia 07/06/2021   Lymphedema    Malaise 12/30/2020   RA (rheumatoid arthritis) (HCC)    Rheumatoid arthritis (HCC) 08/12/2020   Transaminitis 08/12/2020   Uterine cancer (HCC)     Past Surgical History:  Procedure Laterality Date   APPENDECTOMY     cheek biopsy  11/2020   CHOLECYSTECTOMY     ESOPHAGOGASTRODUODENOSCOPY N/A 04/30/2017   Procedure: ESOPHAGOGASTRODUODENOSCOPY (EGD);  Surgeon: Dorena Cookey, MD;  Location: [facility] ENDOSCOPY;  Service: Endoscopy;  Laterality: N/A;   HERNIA REPAIR     x2   KNEE SURGERY     x3   MINIMALLY INVASIVE FORAMINOTOMY CERVICAL SPINE     C6-T1, Nitka   SAVORY DILATION N/A 04/30/2017   Procedure: SAVORY DILATION;  Surgeon: Dorena Cookey, MD;  Location: [facility] ENDOSCOPY;  Service: Endoscopy;  Laterality: N/A;   TONSILLECTOMY     TOTAL ABDOMINAL HYSTERECTOMY     Sarcoma, s/p XRT    Social History   Socioeconomic History   Marital status: Married    Spouse name: Not on file   Number of children: Not on file   Years of education: Not on file   Highest education level: Not on file  Occupational History   Not on file  Tobacco Use   Smoking status: Never  Passive exposure: Yes   Smokeless tobacco: Never  Vaping Use   Vaping Use: Never used  Substance and Sexual Activity   Alcohol use: Not Currently   Drug use: Yes    Comment: prescribed oxycodone   Sexual activity: Yes    Partners: Male    Birth control/protection: None  Other Topics Concern   Not on file  Social History Narrative   Not on file   Social Determinants of Health   Financial Resource Strain: Not on file  Food Insecurity: No Food Insecurity (05/02/2023)   Hunger Vital Sign    Worried About Running Out of Food in the Last Year: Never true    Ran Out of Food in the Last Year: Never true  Transportation Needs: No Transportation Needs (05/02/2023)   PRAPARE - Administrator, Civil Service (Medical): No    Lack of Transportation (Non-Medical): No   Physical Activity: Not on file  Stress: Not on file  Social Connections: Not on file  Intimate Partner Violence: Not At Risk (05/02/2023)   Humiliation, Afraid, Rape, and Kick questionnaire    Fear of Current or Ex-Partner: No    Emotionally Abused: No    Physically Abused: No    Sexually Abused: No    Family History  Problem Relation Age of Onset   Hashimoto's thyroiditis Mother    Eczema Mother    Angioedema Mother    Colonic polyp Mother    Throat cancer Father    Arrhythmia Father    Angioedema Maternal Grandfather    Hypertension Other    Hyperlipidemia Other    Colon cancer Other        grandmother   Allergies  Allergen Reactions   Cyanoacrylate Hives   Other Itching and Rash    Dermabond surgical glue.  Dermabond surgical glue-blisters   Sulfa Antibiotics Anaphylaxis, Rash, Shortness Of Breath and Swelling    Angioedema (also)   Sulfonamide Derivatives Hives, Shortness Of Breath and Swelling    TONGUE SWELLS   Benadryl [Diphenhydramine Hcl] Other (See Comments)    Hyperactivity    Sulfamethoxazole     Other Reaction(s): Angioedema   Diphenhydramine    Diphenhydramine Hcl     Other Reaction(s): Other (See Comments)  HYPERACTIVITY, RESTLESS LEG   Silicone Rash    Watch band   I? Current Facility-Administered Medications  Medication Dose Route Frequency Provider Last Rate Last Admin   0.9 %  sodium chloride infusion   Intravenous Continuous Lorretta Harp, MD 75 mL/hr at 05/02/23 2149 New Bag at 05/02/23 2149   acetaminophen (TYLENOL) tablet 650 mg  650 mg Oral Q6H PRN Lorretta Harp, MD   650 mg at 05/01/23 1240   albuterol (PROVENTIL) (2.5 MG/3ML) 0.083% nebulizer solution 3 mL  3 mL Inhalation Q4H PRN Lorretta Harp, MD   3 mL at 05/03/23 0018   amphetamine-dextroamphetamine (ADDERALL) tablet 20 mg  20 mg Oral TID Lorretta Harp, MD   20 mg at 05/02/23 1543   baclofen (LIORESAL) tablet 10 mg  10 mg Oral QHS Lorretta Harp, MD   10 mg at 05/02/23 2142   cycloSPORINE  (RESTASIS) 0.05 % ophthalmic emulsion 1 drop  1 drop Both Eyes BID Lorretta Harp, MD   1 drop at 05/02/23 2142   dextromethorphan-guaiFENesin (MUCINEX DM) 30-600 MG per 12 hr tablet 1 tablet  1 tablet Oral BID PRN Lorretta Harp, MD       dicyclomine (BENTYL) capsule 10 mg  10 mg Oral Daily PRN  Lorretta Harp, MD       famotidine (PEPCID) tablet 40 mg  40 mg Oral QHS Lorretta Harp, MD   40 mg at 05/02/23 2142   fluconazole (DIFLUCAN) tablet 300 mg  300 mg Oral Q M,W,F Lorretta Harp, MD   300 mg at 05/01/23 2021   folic acid (FOLVITE) tablet 2 mg  2 mg Oral Daily Lorretta Harp, MD   2 mg at 05/02/23 3664   LORazepam (ATIVAN) tablet 1 mg  1 mg Oral TID PRN Lorretta Harp, MD   1 mg at 05/02/23 1706   magic mouthwash w/lidocaine  5 mL Oral TID PRN Foye Deer, RPH       mometasone-formoterol (DULERA) 100-5 MCG/ACT inhaler 2 puff  2 puff Inhalation BID Lorretta Harp, MD   2 puff at 05/03/23 0018   morphine (PF) 2 MG/ML injection 2 mg  2 mg Intravenous Q4H PRN Lorretta Harp, MD   2 mg at 05/02/23 1543   olopatadine (PATANOL) 0.1 % ophthalmic solution 1 drop  1 drop Both Eyes BID Lorretta Harp, MD   1 drop at 05/03/23 0027   ondansetron (ZOFRAN) injection 4 mg  4 mg Intravenous Q8H PRN Lorretta Harp, MD   4 mg at 05/02/23 0357   oxyCODONE-acetaminophen (PERCOCET/ROXICET) 5-325 MG per tablet 1 tablet  1 tablet Oral Q4H PRN Lorretta Harp, MD   1 tablet at 05/03/23 0019   pantoprazole (PROTONIX) EC tablet 40 mg  40 mg Oral BID Lorretta Harp, MD   40 mg at 05/02/23 2142   polyvinyl alcohol (LIQUIFILM TEARS) 1.4 % ophthalmic solution 1 drop  1 drop Both Eyes BID Lorretta Harp, MD   1 drop at 05/03/23 0019   pravastatin (PRAVACHOL) tablet 40 mg  40 mg Oral QHS Lorretta Harp, MD   40 mg at 05/02/23 2142   saccharomyces boulardii (FLORASTOR) capsule 250 mg  250 mg Oral BID Lorretta Harp, MD   250 mg at 05/02/23 2142   SUMAtriptan (IMITREX) tablet 25 mg  25 mg Oral Q2H PRN Lorretta Harp, MD   25 mg at 05/02/23 1019   topiramate (TOPAMAX) tablet 100 mg  100 mg  Oral Daily Lorretta Harp, MD   100 mg at 05/02/23 0940   topiramate (TOPAMAX) tablet 25 mg  25 mg Oral QHS Lorretta Harp, MD   25 mg at 05/02/23 2142   traZODone (DESYREL) tablet 50 mg  50 mg Oral QHS PRN Lorretta Harp, MD         Abtx:  Anti-infectives (From admission, onward)    Start     Dose/Rate Route Frequency Ordered Stop   05/02/23 0030  vancomycin (VANCOCIN) IVPB 1000 mg/200 mL premix  Status:  Discontinued        1,000 mg 200 mL/hr over 60 Minutes Intravenous Every 12 hours 05/01/23 1251 05/02/23 1744   05/01/23 2130  ampicillin (OMNIPEN) 2 g in sodium chloride 0.9 % 100 mL IVPB  Status:  Discontinued        2 g 300 mL/hr over 20 Minutes Intravenous Every 4 hours 05/01/23 2125 05/02/23 1744   05/01/23 1915  fluconazole (DIFLUCAN) tablet 300 mg        300 mg Oral Every M-W-F 05/01/23 1911     05/01/23 1500  ceFEPIme (MAXIPIME) 2 g in sodium chloride 0.9 % 100 mL IVPB  Status:  Discontinued        2 g 200 mL/hr over 30 Minutes Intravenous Every 8 hours 05/01/23 1251 05/02/23 1744  05/01/23 1200  acyclovir (ZOVIRAX) 655 mg in dextrose 5 % 100 mL IVPB  Status:  Discontinued        10 mg/kg  65.5 kg (Adjusted) 113.1 mL/hr over 60 Minutes Intravenous Every 8 hours 05/01/23 1106 05/02/23 1744   05/01/23 1030  cefTRIAXone (ROCEPHIN) 2 g in sodium chloride 0.9 % 100 mL IVPB        2 g 200 mL/hr over 30 Minutes Intravenous  Once 05/01/23 1015 05/01/23 1117   05/01/23 1030  vancomycin (VANCOREADY) IVPB 1750 mg/350 mL        1,750 mg 175 mL/hr over 120 Minutes Intravenous  Once 05/01/23 1015 05/01/23 1452       REVIEW OF SYSTEMS:  Const:  fever, negative chills, negative weight loss Eyes: negative diplopia or visual changes, negative eye pain ENT: negative coryza, negative sore throat Ulcers mouth Resp: negative cough, hemoptysis, dyspnea Cards: negative for chest pain, palpitations, lower extremity edema GU: negative for frequency, dysuria and hematuria GI:  abdominal pain, ,  nausea Skin: negative for rash and pruritus Heme: negative for easy bruising and gum/nose bleeding MS: negative for myalgias, arthralgias, back pain and muscle weakness Neurolo:negative for headaches, dizziness, vertigo, memory problems  Psych: negative for feelings of anxiety, depression  Endocrine: negative for thyroid, diabetes Allergy/Immunology- multiple allergies  Objective:  VITALS:  BP (!) 137/91 (BP Location: Left Arm)   Pulse 77   Temp 99 F (37.2 C) (Oral)   Resp 20   Ht 5\' 4"  (1.626 m)   Wt 81.6 kg   SpO2 100%   BMI 30.90 kg/m   PHYSICAL EXAM:  General: Alert, cooperative, no distress, appears stated age.  Head: Normocephalic, without obvious abnormality, atraumatic. Eyes: Conjunctivae clear, anicteric sclerae. Pupils are equal ENT Nares normal. No drainage or sinus tenderness. Lips, mucosa, and tongue normal. No Thrush Neck: Supple, symmetrical, no adenopathy, thyroid: non tender no carotid bruit and no JVD. Back: No CVA tenderness. Lungs: Clear to auscultation bilaterally. No Wheezing or Rhonchi. No rales. Heart: Regular rate and rhythm, no murmur, rub or gallop. Abdomen: Soft,multiple lap scars- umbilical hernia. Bowel sounds normal. No masses Extremities: atraumatic, no cyanosis. No edema. No clubbing Skin: No rashes or lesions. Or bruising Lymph: Cervical, supraclavicular normal. Neurologic: Grossly non-focal Pertinent Labs Lab Results CBC    Component Value Date/Time   WBC 8.2 05/02/2023 0407   RBC 3.90 05/02/2023 0407   HGB 11.7 (L) 05/02/2023 0407   HGB 11.5 04/02/2020 1545   HCT 34.2 (L) 05/02/2023 0407   HCT 35.2 04/02/2020 1545   PLT 208 05/02/2023 0407   PLT 279 04/02/2020 1545   MCV 87.7 05/02/2023 0407   MCV 96 04/02/2020 1545   MCH 30.0 05/02/2023 0407   MCHC 34.2 05/02/2023 0407   RDW 13.9 05/02/2023 0407   RDW 12.8 04/02/2020 1545   LYMPHSABS 1.3 05/01/2023 0610   LYMPHSABS 2.5 04/02/2020 1545   MONOABS 0.4 05/01/2023 0610    EOSABS 0.1 05/01/2023 0610   EOSABS 0.5 (H) 04/02/2020 1545   BASOSABS 0.0 05/01/2023 0610   BASOSABS 0.1 04/02/2020 1545       Latest Ref Rng & Units 05/02/2023    4:07 AM 05/01/2023    6:10 AM 12/08/2022    8:33 PM  CMP  Glucose 70 - 99 mg/dL 161  096  96   BUN 6 - 20 mg/dL 8  6  <5   Creatinine 0.45 - 1.00 mg/dL 4.09  8.11  9.14   Sodium 135 -  145 mmol/L 141  138  140   Potassium 3.5 - 5.1 mmol/L 4.5  3.4  3.8   Chloride 98 - 111 mmol/L 118  108  106   CO2 22 - 32 mmol/L 17  22  26    Calcium 8.9 - 10.3 mg/dL 9.1  9.3  9.5   Total Protein 6.5 - 8.1 g/dL  7.7    Total Bilirubin 0.3 - 1.2 mg/dL  0.6    Alkaline Phos 38 - 126 U/L  93    AST 15 - 41 U/L  42    ALT 0 - 44 U/L  20        Microbiology: Recent Results (from the past 240 hour(s))  Culture, blood (Routine x 2)     Status: None (Preliminary result)   Collection Time: 05/01/23  6:19 AM   Specimen: BLOOD  Result Value Ref Range Status   Specimen Description BLOOD BLOOD LEFT FOREARM  Final   Special Requests   Final    BOTTLES DRAWN AEROBIC AND ANAEROBIC Blood Culture adequate volume   Culture   Final    NO GROWTH 1 DAY Performed at [facility], [address], [place], [place] [address]    Report Status PENDING  Incomplete  Culture, blood (Routine x 2)     Status: None (Preliminary result)   Collection Time: 05/01/23  6:24 AM   Specimen: BLOOD  Result Value Ref Range Status   Specimen Description BLOOD LEFT ANTECUBITAL  Final   Special Requests   Final    BOTTLES DRAWN AEROBIC AND ANAEROBIC Blood Culture adequate volume   Culture   Final    NO GROWTH 1 DAY Performed at [facility], [address], [place], [place] [address]    Report Status PENDING  Incomplete  SARS Coronavirus 2 by RT PCR (hospital order, performed in [facility] hospital lab) *cepheid single result test* Anterior Nasal Swab     Status: None   Collection Time: 05/01/23  6:30 AM   Specimen: Anterior Nasal Swab  Result  Value Ref Range Status   SARS Coronavirus 2 by RT PCR NEGATIVE NEGATIVE Final    Comment: (NOTE) SARS-CoV-2 target nucleic acids are NOT DETECTED.  The SARS-CoV-2 RNA is generally detectable in upper and lower respiratory specimens during the acute phase of infection. The lowest concentration of SARS-CoV-2 viral copies this assay can detect is 250 copies / mL. A negative result does not preclude SARS-CoV-2 infection and should not be used as the sole basis for treatment or other patient management decisions.  A negative result may occur with improper specimen collection / handling, submission of specimen other than nasopharyngeal swab, presence of viral mutation(s) within the areas targeted by this assay, and inadequate number of viral copies (<250 copies / mL). A negative result must be combined with clinical observations, patient history, and epidemiological information.  Fact Sheet for Patients:   RoadLapTop.co.za  Fact Sheet for Healthcare Providers: http://kim-miller.com/  This test is not yet approved or  cleared by the [place] FDA and has been authorized for detection and/or diagnosis of SARS-CoV-2 by FDA under an Emergency Use Authorization (EUA).  This EUA will remain in effect (meaning this test can be used) for the duration of the COVID-19 declaration under Section 564(b)(1) of the Act, 21 U.S.C. section 360bbb-3(b)(1), unless the authorization is terminated or revoked sooner.  Performed at [facility], [address], [place], [place] [address]   Resp Panel by RT-PCR (Flu  A&B, Covid)     Status: None   Collection Time: 05/01/23  6:35 AM  Result Value Ref Range Status   SARS Coronavirus 2 by RT PCR NEGATIVE NEGATIVE Final    Comment: (NOTE) SARS-CoV-2 target nucleic acids are NOT DETECTED.  The SARS-CoV-2 RNA is generally detectable in upper respiratory specimens during the acute phase of infection. The  lowest concentration of SARS-CoV-2 viral copies this assay can detect is 138 copies/mL. A negative result does not preclude SARS-Cov-2 infection and should not be used as the sole basis for treatment or other patient management decisions. A negative result may occur with  improper specimen collection/handling, submission of specimen other than nasopharyngeal swab, presence of viral mutation(s) within the areas targeted by this assay, and inadequate number of viral copies(<138 copies/mL). A negative result must be combined with clinical observations, patient history, and epidemiological information. The expected result is Negative.  Fact Sheet for Patients:  BloggerCourse.com  Fact Sheet for Healthcare Providers:  SeriousBroker.it  This test is no t yet approved or cleared by the [place] FDA and  has been authorized for detection and/or diagnosis of SARS-CoV-2 by FDA under an Emergency Use Authorization (EUA). This EUA will remain  in effect (meaning this test can be used) for the duration of the COVID-19 declaration under Section 564(b)(1) of the Act, 21 U.S.C.section 360bbb-3(b)(1), unless the authorization is terminated  or revoked sooner.       Influenza A by PCR NEGATIVE NEGATIVE Final   Influenza B by PCR NEGATIVE NEGATIVE Final    Comment: (NOTE) The Xpert Xpress SARS-CoV-2/FLU/RSV plus assay is intended as an aid in the diagnosis of influenza from Nasopharyngeal swab specimens and should not be used as a sole basis for treatment. Nasal washings and aspirates are unacceptable for Xpert Xpress SARS-CoV-2/FLU/RSV testing.  Fact Sheet for Patients: BloggerCourse.com  Fact Sheet for Healthcare Providers: SeriousBroker.it  This test is not yet approved or cleared by the [place] FDA and has been authorized for detection and/or diagnosis of SARS-CoV-2 by FDA under  an Emergency Use Authorization (EUA). This EUA will remain in effect (meaning this test can be used) for the duration of the COVID-19 declaration under Section 564(b)(1) of the Act, 21 U.S.C. section 360bbb-3(b)(1), unless the authorization is terminated or revoked.  Performed at [facility], [address] [place], [place] [address]   CSF culture w Gram Stain     Status: None (Preliminary result)   Collection Time: 05/01/23 10:20 AM   Specimen: CSF; Cerebrospinal Fluid  Result Value Ref Range Status   Specimen Description   Final    CSF Performed at [facility], [address] [place], [place] [address]    Special Requests   Final    NONE Performed at [facility], [address] [place], [place] [address]    Gram Stain   Final    WBC SEEN NO RBC SEEN NO ORGANISMS SEEN Performed at [facility], [address] [place], [place] [address]    Culture   Final    NO GROWTH < 24 HOURS Performed at [facility], [address], [place], [place] [address]    Report Status PENDING  Incomplete  Respiratory (~20 pathogens) panel by PCR     Status: None   Collection Time: 05/01/23  2:09 PM   Specimen: Nasopharyngeal Swab; Respiratory  Result Value Ref Range Status   Adenovirus NOT DETECTED NOT DETECTED Final   Coronavirus 229E NOT  DETECTED NOT DETECTED Final    Comment: (NOTE) The Coronavirus on the Respiratory Panel, DOES NOT test for the novel  Coronavirus (2019 nCoV)    Coronavirus HKU1 NOT DETECTED NOT DETECTED Final   Coronavirus NL63 NOT DETECTED NOT DETECTED Final   Coronavirus OC43 NOT DETECTED NOT DETECTED Final   Metapneumovirus NOT DETECTED NOT DETECTED Final   Rhinovirus / Enterovirus NOT DETECTED NOT DETECTED Final   Influenza A NOT DETECTED NOT DETECTED Final   Influenza B NOT DETECTED NOT DETECTED Final   Parainfluenza Virus 1 NOT DETECTED NOT DETECTED Final   Parainfluenza Virus 2 NOT DETECTED NOT DETECTED  Final   Parainfluenza Virus 3 NOT DETECTED NOT DETECTED Final   Parainfluenza Virus 4 NOT DETECTED NOT DETECTED Final   Respiratory Syncytial Virus NOT DETECTED NOT DETECTED Final   Bordetella pertussis NOT DETECTED NOT DETECTED Final   Bordetella Parapertussis NOT DETECTED NOT DETECTED Final   Chlamydophila pneumoniae NOT DETECTED NOT DETECTED Final   Mycoplasma pneumoniae NOT DETECTED NOT DETECTED Final    Comment: Performed at [facility], [address] [place], [place] [address]  ME panel - Negative  IMAGING RESULTS: CXR NAD CT head -NAD CTA - no acute findings I have personally reviewed the films ? Impression/Recommendation ?Aseptic meningitis- No bacterial or viral organisms identifed on the ME panel ( no HSV, VZV, strep pneumo, neisseria etc)  Could this be secondary to IVIG which rarely can cause aseptic meningitis especially people with migraine ar at higher risk Will DC all antibiotics and antiviral  Immunoglobulin deficiency and is getting IVIG - last received on 5/3 Need to discuss with her provider in [facility] to find out what brand and the dose  Rh arthritis on no meds EOE- on dupixent H/o uterine sarcoma- s/p hysterectomy and radiation   ___________________________________________________ Discussed with patient, requesting provider Note:  This document was prepared using Dragon voice recognition software and may include unintentional dictation errors.

## 2023-05-03 NOTE — Progress Notes (Signed)
Date of Admission:  05/01/2023   Total days of antibiotics ***        Day ***        Day ***        Day ***   ID: [name] Horn is a 47 y.o. female with  *** Principal Problem:   Meningitis Active Problems:   History of asthma   Eosinophilic esophagitis   Antibody deficiency syndrome (HCC)   HLD (hyperlipidemia)   Obesity with body mass index (BMI) of 30.0 to 39.9   RA (rheumatoid arthritis) (HCC)   Migraine   Oral thrush   Hypokalemia   Sepsis (HCC)    Subjective: ***  Medications:   amphetamine-dextroamphetamine  20 mg Oral TID   baclofen  10 mg Oral QHS   cycloSPORINE  1 drop Both Eyes BID   famotidine  40 mg Oral QHS   fluconazole  300 mg Oral Q M,W,F   folic acid  2 mg Oral Daily   mometasone-formoterol  2 puff Inhalation BID   olopatadine  1 drop Both Eyes BID   pantoprazole  40 mg Oral BID   polyvinyl alcohol  1 drop Both Eyes BID   pravastatin  40 mg Oral QHS   saccharomyces boulardii  250 mg Oral BID   topiramate  100 mg Oral Daily   topiramate  25 mg Oral QHS    Objective: Vital signs in last 24 hours: Patient Vitals for the past 24 hrs:  BP Temp Temp src Pulse Resp SpO2  05/03/23 0748 131/84 98.3 F (36.8 C) -- 70 16 100 %  05/03/23 0551 (!) 144/93 98.2 F (36.8 C) Oral 66 20 --  05/02/23 1957 (!) 137/91 99 F (37.2 C) Oral 77 20 100 %  05/02/23 1638 136/82 98.7 F (37.1 C) -- 92 19 100 %  05/02/23 1426 134/84 98.9 F (37.2 C) -- (!) 108 (!) 24 98 %  05/02/23 1330 (!) 124/93 -- -- 96 (!) 25 97 %     LDA Foley Central lines Other catheters  PHYSICAL EXAM:  General: Alert, cooperative, no distress, appears stated age.  Head: Normocephalic, without obvious abnormality, atraumatic. Eyes: Conjunctivae clear, anicteric sclerae. Pupils are equal ENT Nares normal. No drainage or sinus tenderness. Lips, mucosa, and tongue normal. No Thrush Neck: Supple, symmetrical, no adenopathy, thyroid: non tender no carotid bruit and no JVD. Back: No  CVA tenderness. Lungs: Clear to auscultation bilaterally. No Wheezing or Rhonchi. No rales. Heart: Regular rate and rhythm, no murmur, rub or gallop. Abdomen: Soft, non-tender,not distended. Bowel sounds normal. No masses Extremities: atraumatic, no cyanosis. No edema. No clubbing Skin: No rashes or lesions. Or bruising Lymph: Cervical, supraclavicular normal. Neurologic: Grossly non-focal  Lab Results    Latest Ref Rng & Units 05/03/2023    6:30 AM 05/02/2023    4:07 AM 05/01/2023    6:10 AM  CBC  WBC 4.0 - 10.5 K/uL 8.0  8.2  5.7   Hemoglobin 12.0 - 15.0 g/dL 16.1  09.6  04.5   Hematocrit 36.0 - 46.0 % 36.7  34.2  38.5   Platelets 150 - 400 K/uL 214  208  219        Latest Ref Rng & Units 05/03/2023    6:30 AM 05/02/2023    4:07 AM 05/01/2023    6:10 AM  CMP  Glucose 70 - 99 mg/dL 409  811  914   BUN 6 - 20 mg/dL 9  8  6  Creatinine 0.44 - 1.00 mg/dL 0.98  1.19  1.47   Sodium 135 - 145 mmol/L 144  141  138   Potassium 3.5 - 5.1 mmol/L 3.6  4.5  3.4   Chloride 98 - 111 mmol/L 115  118  108   CO2 22 - 32 mmol/L 22  17  22    Calcium 8.9 - 10.3 mg/dL 9.0  9.1  9.3   Total Protein 6.5 - 8.1 g/dL   7.7   Total Bilirubin 0.3 - 1.2 mg/dL   0.6   Alkaline Phos 38 - 126 U/L   93   AST 15 - 41 U/L   42   ALT 0 - 44 U/L   20       Microbiology:  Studies/Results: No results found.   Assessment/Plan: Aseptic meningitis- No common bacterial or viral etiology identified. Need to consider IVIG induced aseptic  meningitis in the DD, though rare ,cases have been reported, because of the following reasons for the past 6 cycles she has been very exhausted after IVIG Gammagard and higher dose of 40gms compared to privigen( 30gms )infusion before She has migraine and that puts her at a higher risk for IVIG induced AM Will discuss with her physician- and prevention in the future- IV hydration, slower infusion ( 6grams/hr)

## 2023-05-03 NOTE — Progress Notes (Addendum)
Progress Note    [name] Horn  VWU:[id number] DOB: August 22, 1976  DOA: 05/01/2023 PCP: [facility]      Brief Narrative:    Medical records reviewed and are as summarized below:  [name] Horn is a 47 y.o. 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 difficile colitis, rheumatoid arthritis, herpes labialis, gastroparesis, left leg DVT 2011 not on anticoagulants.  She presented to the hospital because of headache, fever and chills.       Assessment/Plan:   Principal Problem:   Meningitis Active Problems:   Sepsis (HCC)   Antibody deficiency syndrome (HCC)   RA (rheumatoid arthritis) (HCC)   HLD (hyperlipidemia)   History of asthma   Migraine   Eosinophilic esophagitis   Hypokalemia   Oral thrush   Obesity with body mass index (BMI) of 30.0 to 39.9   Body mass index is 30.9 kg/m.   Sepsis secondary to acute aseptic meningitis: Meningitis/encephalitis panel was negative.  ID recommended discontinuation of antibiotics and acyclovir (discontinued on 05/02/2023).  IV immunoglobulin suspected as the cause of meningitis.   Antibody deficiency syndrome: She receives IV immunoglobulin every month.  Last dose was on 04/28/2023.  She had received 3 doses previously.   Refractory headache: Obtain MRI brain with and without contrast because of history of uterine sarcoma.  Patient also has history of migraine headache.  Consulted Dr. Selina Cooley, neurologist, to assist with management.   Poor oral intake: Continue IV fluids for hydration.   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 difficile colitis    Diet Order             Diet Heart Fluid consistency: Thin  Diet effective now                            Consultants: ID specialist  Procedures: None    Medications:    amphetamine-dextroamphetamine  20 mg Oral TID   baclofen  10 mg Oral QHS   cycloSPORINE  1 drop Both Eyes BID   famotidine  40 mg Oral QHS   fluconazole  300 mg Oral Q M,W,F   folic acid  2 mg Oral Daily   mometasone-formoterol  2 puff Inhalation BID   olopatadine  1 drop Both Eyes BID   pantoprazole  40 mg Oral BID   polyvinyl alcohol  1 drop Both Eyes BID   pravastatin  40 mg Oral QHS   saccharomyces boulardii  250 mg Oral BID   topiramate  100 mg Oral Daily   topiramate  25 mg Oral QHS   Continuous Infusions:  sodium chloride       Anti-infectives (From admission, onward)    Start     Dose/Rate Route Frequency Ordered Stop   05/02/23 0030  vancomycin (VANCOCIN) IVPB 1000 mg/200 mL premix  Status:  Discontinued        1,000 mg 200 mL/hr over 60 Minutes Intravenous Every 12 hours 05/01/23 1251 05/02/23 1744   05/01/23 2130  ampicillin (OMNIPEN) 2 g in sodium chloride 0.9 % 100 mL IVPB  Status:  Discontinued        2 g 300 mL/hr  over 20 Minutes Intravenous Every 4 hours 05/01/23 2125 05/02/23 1744   05/01/23 1915  fluconazole (DIFLUCAN) tablet 300 mg        300 mg Oral Every M-W-F 05/01/23 1911     05/01/23 1500  ceFEPIme (MAXIPIME) 2 g in sodium chloride 0.9 % 100 mL IVPB  Status:  Discontinued        2 g 200 mL/hr over 30 Minutes Intravenous Every 8 hours 05/01/23 1251 05/02/23 1744   05/01/23 1200  acyclovir (ZOVIRAX) 655 mg in dextrose 5 % 100 mL IVPB  Status:  Discontinued        10 mg/kg  65.5 kg (Adjusted) 113.1 mL/hr over 60 Minutes Intravenous Every 8 hours 05/01/23 1106 05/02/23 1744   05/01/23 1030  cefTRIAXone (ROCEPHIN) 2 g in sodium chloride 0.9 % 100 mL IVPB        2 g 200 mL/hr over 30 Minutes Intravenous  Once  05/01/23 1015 05/01/23 1117   05/01/23 1030  vancomycin (VANCOREADY) IVPB 1750 mg/350 mL        1,750 mg 175 mL/hr over 120 Minutes Intravenous  Once 05/01/23 1015 05/01/23 1452              Family Communication/Anticipated D/C date and plan/Code Status   DVT prophylaxis: Place and maintain sequential compression device Start: 05/02/23 1407 SCDs Start: 05/01/23 1123     Code Status: Full Code  Family Communication: None Disposition Plan: Plan to discharge home in 2 days   Status is: Inpatient Remains inpatient appropriate because: Meningitis, headache       Subjective:   Interval events noted.  She complains of headache, blurry vision, abnormal smell and taste sensation, and pain in the back of the neck.  Objective:    Vitals:   05/02/23 1638 05/02/23 1957 05/03/23 0551 05/03/23 0748  BP: 136/82 (!) 137/91 (!) 144/93 131/84  Pulse: 92 77 66 70  Resp: 19 20 20 16   Temp: 98.7 F (37.1 C) 99 F (37.2 C) 98.2 F (36.8 C) 98.3 F (36.8 C)  TempSrc:  Oral Oral   SpO2: 100% 100%  100%  Weight:      Height:       No data found.   Intake/Output Summary (Last 24 hours) at 05/03/2023 1411 Last data filed at 05/02/2023 1829 Gross per 24 hour  Intake 1608.35 ml  Output --  Net 1608.35 ml   Filed Weights   05/01/23 0981  Weight: 81.6 kg    Exam:  GEN: NAD SKIN: No rash EYES: EOMI ENT: MMM, stiff neck CV: RRR PULM: CTA B ABD: soft, ND, NT, +BS CNS: AAO x 3, non focal EXT: No edema or tenderness        Data Reviewed:   I have personally reviewed following labs and imaging studies:  Labs: Labs show the following:   Basic Metabolic Panel: Recent Labs  Lab 05/01/23 0610 05/01/23 0738 05/02/23 0407 05/03/23 0630  NA 138  --  141 144  K 3.4*  --  4.5 3.6  CL 108  --  118* 115*  CO2 22  --  17* 22  GLUCOSE 114*  --  126* 114*  BUN 6  --  8 9  CREATININE 0.90  --  0.72 0.78  CALCIUM 9.3  --  9.1 9.0  MG  --  1.7  --   --     GFR Estimated Creatinine Clearance: 88.9 mL/min (by C-G formula based on SCr of 0.78 mg/dL). Liver Function  Tests: Recent Labs  Lab 05/01/23 0610  AST 42*  ALT 20  ALKPHOS 93  BILITOT 0.6  PROT 7.7  ALBUMIN 3.8   No results for input(s): "LIPASE", "AMYLASE" in the last 168 hours. No results for input(s): "AMMONIA" in the last 168 hours. Coagulation profile Recent Labs  Lab 05/01/23 0619  INR 1.1    CBC: Recent Labs  Lab 05/01/23 0610 05/02/23 0407 05/03/23 0630  WBC 5.7 8.2 8.0  NEUTROABS 3.8  --   --   HGB 12.9 11.7* 12.1  HCT 38.5 34.2* 36.7  MCV 88.7 87.7 90.2  PLT 219 208 214   Cardiac Enzymes: No results for input(s): "CKTOTAL", "CKMB", "CKMBINDEX", "TROPONINI" in the last 168 hours. BNP (last 3 results) No results for input(s): "PROBNP" in the last 8760 hours. CBG: No results for input(s): "GLUCAP" in the last 168 hours. D-Dimer: No results for input(s): "DDIMER" in the last 72 hours. Hgb A1c: No results for input(s): "HGBA1C" in the last 72 hours. Lipid Profile: No results for input(s): "CHOL", "HDL", "LDLCALC", "TRIG", "CHOLHDL", "LDLDIRECT" in the last 72 hours. Thyroid function studies: No results for input(s): "TSH", "T4TOTAL", "T3FREE", "THYROIDAB" in the last 72 hours.  Invalid input(s): "FREET3" Anemia work up: No results for input(s): "VITAMINB12", "FOLATE", "FERRITIN", "TIBC", "IRON", "RETICCTPCT" in the last 72 hours. Sepsis Labs: Recent Labs  Lab 05/01/23 0610 05/02/23 0407 05/03/23 0630  PROCALCITON <0.10  --   --   WBC 5.7 8.2 8.0  LATICACIDVEN 1.6  --   --     Microbiology Recent Results (from the past 240 hour(s))  Culture, blood (Routine x 2)     Status: None (Preliminary result)   Collection Time: 05/01/23  6:19 AM   Specimen: BLOOD  Result Value Ref Range Status   Specimen Description BLOOD BLOOD LEFT FOREARM  Final   Special Requests   Final    BOTTLES DRAWN AEROBIC AND ANAEROBIC Blood Culture adequate volume    Culture   Final    NO GROWTH 2 DAYS Performed at [facility], [address] [place], [place] [address]    Report Status PENDING  Incomplete  Culture, blood (Routine x 2)     Status: None (Preliminary result)   Collection Time: 05/01/23  6:24 AM   Specimen: BLOOD  Result Value Ref Range Status   Specimen Description BLOOD LEFT ANTECUBITAL  Final   Special Requests   Final    BOTTLES DRAWN AEROBIC AND ANAEROBIC Blood Culture adequate volume   Culture   Final    NO GROWTH 2 DAYS Performed at [facility], [address] [place], [place] [address]    Report Status PENDING  Incomplete  SARS Coronavirus 2 by RT PCR (hospital order, performed in [facility] hospital lab) *cepheid single result test* Anterior Nasal Swab     Status: None   Collection Time: 05/01/23  6:30 AM   Specimen: Anterior Nasal Swab  Result Value Ref Range Status   SARS Coronavirus 2 by RT PCR NEGATIVE NEGATIVE Final    Comment: (NOTE) SARS-CoV-2 target nucleic acids are NOT DETECTED.  The SARS-CoV-2 RNA is generally detectable in upper and lower respiratory specimens during the acute phase of infection. The lowest concentration of SARS-CoV-2 viral copies this assay can detect is 250 copies / mL. A negative result does not preclude SARS-CoV-2 infection and should not be used as the sole basis for treatment or other patient management decisions.  A negative result may occur with improper specimen  collection / handling, submission of specimen other than nasopharyngeal swab, presence of viral mutation(s) within the areas targeted by this assay, and inadequate number of viral copies (<250 copies / mL). A negative result must be combined with clinical observations, patient history, and epidemiological information.  Fact Sheet for Patients:   RoadLapTop.co.za  Fact Sheet for Healthcare Providers: http://kim-miller.com/  This test is not yet approved  or  cleared by the [place] FDA and has been authorized for detection and/or diagnosis of SARS-CoV-2 by FDA under an Emergency Use Authorization (EUA).  This EUA will remain in effect (meaning this test can be used) for the duration of the COVID-19 declaration under Section 564(b)(1) of the Act, 21 U.S.C. section 360bbb-3(b)(1), unless the authorization is terminated or revoked sooner.  Performed at [facility], [address] [place], [place] [address]   Resp Panel by RT-PCR (Flu A&B, Covid)     Status: None   Collection Time: 05/01/23  6:35 AM  Result Value Ref Range Status   SARS Coronavirus 2 by RT PCR NEGATIVE NEGATIVE Final    Comment: (NOTE) SARS-CoV-2 target nucleic acids are NOT DETECTED.  The SARS-CoV-2 RNA is generally detectable in upper respiratory specimens during the acute phase of infection. The lowest concentration of SARS-CoV-2 viral copies this assay can detect is 138 copies/mL. A negative result does not preclude SARS-Cov-2 infection and should not be used as the sole basis for treatment or other patient management decisions. A negative result may occur with  improper specimen collection/handling, submission of specimen other than nasopharyngeal swab, presence of viral mutation(s) within the areas targeted by this assay, and inadequate number of viral copies(<138 copies/mL). A negative result must be combined with clinical observations, patient history, and epidemiological information. The expected result is Negative.  Fact Sheet for Patients:  BloggerCourse.com  Fact Sheet for Healthcare Providers:  SeriousBroker.it  This test is no t yet approved or cleared by the [place] FDA and  has been authorized for detection and/or diagnosis of SARS-CoV-2 by FDA under an Emergency Use Authorization (EUA). This EUA will remain  in effect (meaning this test can be used) for the duration of  the COVID-19 declaration under Section 564(b)(1) of the Act, 21 U.S.C.section 360bbb-3(b)(1), unless the authorization is terminated  or revoked sooner.       Influenza A by PCR NEGATIVE NEGATIVE Final   Influenza B by PCR NEGATIVE NEGATIVE Final    Comment: (NOTE) The Xpert Xpress SARS-CoV-2/FLU/RSV plus assay is intended as an aid in the diagnosis of influenza from Nasopharyngeal swab specimens and should not be used as a sole basis for treatment. Nasal washings and aspirates are unacceptable for Xpert Xpress SARS-CoV-2/FLU/RSV testing.  Fact Sheet for Patients: BloggerCourse.com  Fact Sheet for Healthcare Providers: SeriousBroker.it  This test is not yet approved or cleared by the [place] FDA and has been authorized for detection and/or diagnosis of SARS-CoV-2 by FDA under an Emergency Use Authorization (EUA). This EUA will remain in effect (meaning this test can be used) for the duration of the COVID-19 declaration under Section 564(b)(1) of the Act, 21 U.S.C. section 360bbb-3(b)(1), unless the authorization is terminated or revoked.  Performed at [facility], [address] [place], [place] [address]   CSF culture w Gram Stain     Status: None (Preliminary result)   Collection Time: 05/01/23 10:20 AM   Specimen: CSF; Cerebrospinal Fluid  Result Value Ref Range Status   Specimen Description   Final  CSF Performed at [facility], [address] [place], [place] [address]    Special Requests   Final    NONE Performed at [facility], [address] [place], [place] [address]    Gram Stain   Final    WBC SEEN NO RBC SEEN NO ORGANISMS SEEN Performed at [facility], [address], [place], [place] [address]    Culture   Final    NO GROWTH 2 DAYS Performed at [facility], [address], [place], [place] [address]    Report Status PENDING  Incomplete   Respiratory (~20 pathogens) panel by PCR     Status: None   Collection Time: 05/01/23  2:09 PM   Specimen: Nasopharyngeal Swab; Respiratory  Result Value Ref Range Status   Adenovirus NOT DETECTED NOT DETECTED Final   Coronavirus 229E NOT DETECTED NOT DETECTED Final    Comment: (NOTE) The Coronavirus on the Respiratory Panel, DOES NOT test for the novel  Coronavirus (2019 nCoV)    Coronavirus HKU1 NOT DETECTED NOT DETECTED Final   Coronavirus NL63 NOT DETECTED NOT DETECTED Final   Coronavirus OC43 NOT DETECTED NOT DETECTED Final   Metapneumovirus NOT DETECTED NOT DETECTED Final   Rhinovirus / Enterovirus NOT DETECTED NOT DETECTED Final   Influenza A NOT DETECTED NOT DETECTED Final   Influenza B NOT DETECTED NOT DETECTED Final   Parainfluenza Virus 1 NOT DETECTED NOT DETECTED Final   Parainfluenza Virus 2 NOT DETECTED NOT DETECTED Final   Parainfluenza Virus 3 NOT DETECTED NOT DETECTED Final   Parainfluenza Virus 4 NOT DETECTED NOT DETECTED Final   Respiratory Syncytial Virus NOT DETECTED NOT DETECTED Final   Bordetella pertussis NOT DETECTED NOT DETECTED Final   Bordetella Parapertussis NOT DETECTED NOT DETECTED Final   Chlamydophila pneumoniae NOT DETECTED NOT DETECTED Final   Mycoplasma pneumoniae NOT DETECTED NOT DETECTED Final    Comment: Performed at [facility], [address], [place], [place] [address]    Procedures and diagnostic studies:  No results found.             LOS: 2 days   [name] Horn  [facility]   Pager on [email or web address]. If 7PM-7AM, please contact night-coverage at [email or web address]     05/03/2023, 2:11 PM

## 2023-05-03 NOTE — Progress Notes (Signed)
  Transition of Care [facility]) Screening Note   Patient Details  Name: [name] Horn Date of Birth: [birth year]   Transition of [facility]) CM/SW Contact:    Chapman Fitch, RN Phone Number: 05/03/2023, 11:22 AM    Transition of Care Department [facility]) has reviewed patient and no TOC needs have been identified at this time. We will continue to monitor patient advancement through interdisciplinary progression rounds. If new patient transition needs arise, please place a TOC consult.

## 2023-05-03 NOTE — Progress Notes (Incomplete)
Date of Admission:  05/01/2023      ID: [name] Horn is a 47 y.o. female  Principal Problem:   Meningitis Active Problems:   History of asthma   Eosinophilic esophagitis   Antibody deficiency syndrome (HCC)   HLD (hyperlipidemia)   Obesity with body mass index (BMI) of 30.0 to 39.9   RA (rheumatoid arthritis) (HCC)   Migraine   Oral thrush   Hypokalemia   Sepsis (HCC)  Mom at bed side  Subjective: Feeling tired Has severe headache Poor sleep Some nausea Poor appetite  Medications:  . amphetamine-dextroamphetamine  20 mg Oral TID  . baclofen  10 mg Oral QHS  . cycloSPORINE  1 drop Both Eyes BID  . famotidine  40 mg Oral QHS  . fluconazole  300 mg Oral Q M,W,F  . folic acid  2 mg Oral Daily  . mometasone-formoterol  2 puff Inhalation BID  . olopatadine  1 drop Both Eyes BID  . pantoprazole  40 mg Oral BID  . polyvinyl alcohol  1 drop Both Eyes BID  . pravastatin  40 mg Oral QHS  . saccharomyces boulardii  250 mg Oral BID  . topiramate  100 mg Oral Daily  . topiramate  25 mg Oral QHS    Objective: Vital signs in last 24 hours: Patient Vitals for the past 24 hrs:  BP Temp Temp src Pulse Resp SpO2  05/03/23 0748 131/84 98.3 F (36.8 C) -- 70 16 100 %  05/03/23 0551 (!) 144/93 98.2 F (36.8 C) Oral 66 20 --  05/02/23 1957 (!) 137/91 99 F (37.2 C) Oral 77 20 100 %  05/02/23 1638 136/82 98.7 F (37.1 C) -- 92 19 100 %  05/02/23 1426 134/84 98.9 F (37.2 C) -- (!) 108 (!) 24 98 %  05/02/23 1330 (!) 124/93 -- -- 96 (!) 25 97 %      PHYSICAL EXAM:  General: Awake, Alert, cooperative, some discomfort Head: Normocephalic, without obvious abnormality, atraumatic. Eyes: Conjunctivae clear, anicteric sclerae. Pupils are equal ENT Nares normal. No drainage or sinus tenderness. Tongue- geographic tongue Neck: able to flex neck  symmetrical, no adenopathy, thyroid: non tender no carotid bruit and no JVD. Back: No CVA tenderness. Lungs: Clear to auscultation  bilaterally. No Wheezing or Rhonchi. No rales. Heart: Regular rate and rhythm, no murmur, rub or gallop. Abdomen: Soft, non-tender,not distended. Bowel sounds normal. No masses Extremities: atraumatic, no cyanosis. No edema. No clubbing Skin: No rashes or lesions. Or bruising Lymph: Cervical, supraclavicular normal. Neurologic: Grossly non-focal  Lab Results    Latest Ref Rng & Units 05/03/2023    6:30 AM 05/02/2023    4:07 AM 05/01/2023    6:10 AM  CBC  WBC 4.0 - 10.5 K/uL 8.0  8.2  5.7   Hemoglobin 12.0 - 15.0 g/dL 09.8  11.9  14.7   Hematocrit 36.0 - 46.0 % 36.7  34.2  38.5   Platelets 150 - 400 K/uL 214  208  219        Latest Ref Rng & Units 05/03/2023    6:30 AM 05/02/2023    4:07 AM 05/01/2023    6:10 AM  CMP  Glucose 70 - 99 mg/dL 829  562  130   BUN 6 - 20 mg/dL 9  8  6    Creatinine 0.44 - 1.00 mg/dL 8.65  7.84  6.96   Sodium 135 - 145 mmol/L 144  141  138   Potassium 3.5 - 5.1  mmol/L 3.6  4.5  3.4   Chloride 98 - 111 mmol/L 115  118  108   CO2 22 - 32 mmol/L 22  17  22    Calcium 8.9 - 10.3 mg/dL 9.0  9.1  9.3   Total Protein 6.5 - 8.1 g/dL   7.7   Total Bilirubin 0.3 - 1.2 mg/dL   0.6   Alkaline Phos 38 - 126 U/L   93   AST 15 - 41 U/L   42   ALT 0 - 44 U/L   20       Microbiology: CSF ME panel neg BC NEg Studies/Results: No results found.   Assessment/Plan: Aseptic meningitis- No common bacterial or viral etiology identified. Need to consider IVIG induced aseptic  meningitis in the DD, though rare ,cases have been reported, because of the following reasons for the past 6 cycles she has been very exhausted after IVIG Gammagard and higher dose of 40gms compared to privigen( 30gms )infusion before She has migraine and that puts her at a higher risk for IVIG induced AM. Discuss with her physician at [facility] regarding  - and prevention in the future- IV hydration, slower infusion ( 6grams/hr)  Other causes of Aseptic meningitis in her case to r/o paraneoplastic because  of uterine sarcoma history SLE, Sarcoid Recommend Neurology consult

## 2023-05-04 DIAGNOSIS — T50905A Adverse effect of unspecified drugs, medicaments and biological substances, initial encounter: Secondary | ICD-10-CM

## 2023-05-04 DIAGNOSIS — A419 Sepsis, unspecified organism: Secondary | ICD-10-CM | POA: Diagnosis not present

## 2023-05-04 DIAGNOSIS — D806 Antibody deficiency with near-normal immunoglobulins or with hyperimmunoglobulinemia: Secondary | ICD-10-CM | POA: Diagnosis not present

## 2023-05-04 DIAGNOSIS — R519 Headache, unspecified: Secondary | ICD-10-CM

## 2023-05-04 DIAGNOSIS — G030 Nonpyogenic meningitis: Secondary | ICD-10-CM | POA: Diagnosis not present

## 2023-05-04 DIAGNOSIS — K200 Eosinophilic esophagitis: Secondary | ICD-10-CM | POA: Diagnosis not present

## 2023-05-04 DIAGNOSIS — G43909 Migraine, unspecified, not intractable, without status migrainosus: Secondary | ICD-10-CM | POA: Diagnosis not present

## 2023-05-04 LAB — FUNGUS CULTURE RESULT

## 2023-05-04 LAB — CSF CULTURE W GRAM STAIN

## 2023-05-04 LAB — RPR: RPR Ser Ql: NONREACTIVE

## 2023-05-04 MED ORDER — METOCLOPRAMIDE HCL 10 MG PO TABS
10.0000 mg | ORAL_TABLET | Freq: Two times a day (BID) | ORAL | Status: DC
  Administered 2023-05-04 – 2023-05-06 (×4): 10 mg via ORAL
  Filled 2023-05-04 (×4): qty 1

## 2023-05-04 MED ORDER — CYCLOBENZAPRINE HCL 10 MG PO TABS
10.0000 mg | ORAL_TABLET | Freq: Every day | ORAL | Status: DC
  Administered 2023-05-04 – 2023-05-05 (×2): 10 mg via ORAL
  Filled 2023-05-04 (×2): qty 1

## 2023-05-04 MED ORDER — VALACYCLOVIR HCL 500 MG PO TABS
1000.0000 mg | ORAL_TABLET | Freq: Every day | ORAL | Status: DC
  Administered 2023-05-05 – 2023-05-06 (×2): 1000 mg via ORAL
  Filled 2023-05-04 (×2): qty 2

## 2023-05-04 MED ORDER — SODIUM CHLORIDE 0.9 % IV SOLN
12.5000 mg | Freq: Four times a day (QID) | INTRAVENOUS | Status: AC | PRN
  Filled 2023-05-04: qty 0.5

## 2023-05-04 MED ORDER — NYSTATIN 100000 UNIT/ML MT SUSP
10.0000 mL | Freq: Three times a day (TID) | OROMUCOSAL | Status: DC
  Administered 2023-05-04 – 2023-05-06 (×6): 1000000 [IU] via ORAL
  Filled 2023-05-04 (×6): qty 10

## 2023-05-04 NOTE — Progress Notes (Signed)
Progress Note    [name] Horn  ZOX:[id number] DOB: 1976/05/30  DOA: 05/01/2023 PCP: [facility]      Brief Narrative:    Medical records reviewed and are as summarized below:  [name] Horn is a 47 y.o. 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 difficile colitis, rheumatoid arthritis, herpes labialis, gastroparesis, left leg DVT 2011 not on anticoagulants.  She presented to the hospital because of headache, fever and chills.       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   Sepsis secondary to acute aseptic meningitis: Meningitis/encephalitis panel was negative.  ID recommended discontinuation of antibiotics and acyclovir (discontinued on 05/02/2023).  IV immunoglobulin suspected as the cause of meningitis.   Antibody deficiency syndrome: She receives IV immunoglobulin every month.  Last dose was on 04/28/2023.  She had received 3 doses previously.   Refractory headache: MRI brain with and without contrast was unremarkable.  Patient also has history of migraine headache.  Neurologist has been consulted to assist with management.  Poor oral intake: Appetite is improving.  Discontinue IV fluids.   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 difficile colitis.   Patient requested I speak to Danella Sensing, NP, who works with Dr. Ellie Lunch, her allergist.  I called her at [phone] (at 4:11 pm today) but there was no response.  I left a voice message.    Diet Order             Diet Heart Fluid consistency: Thin  Diet effective now                            Consultants: ID specialist Neurologist  Procedures: None    Medications:    amphetamine-dextroamphetamine  20 mg Oral TID   baclofen  10 mg Oral QHS   cyclobenzaprine  10 mg Oral QHS   cycloSPORINE  1 drop Both Eyes BID   famotidine  40 mg Oral QHS   fluconazole  300 mg Oral Q M,W,F   folic acid  2 mg Oral Daily   metoCLOPramide  10 mg Oral BID   metoprolol succinate  50 mg Oral Daily   mometasone-formoterol  2 puff Inhalation BID   nystatin  10 mL Oral TID   olopatadine  1 drop Both Eyes BID   pantoprazole  40 mg Oral BID   polyvinyl alcohol  1 drop Both Eyes BID   pravastatin  40 mg Oral QHS   saccharomyces boulardii  250 mg Oral BID   topiramate  100 mg Oral Daily   topiramate  25 mg Oral QHS   [START ON 05/05/2023] valACYclovir  1,000 mg Oral Daily   Continuous Infusions:  promethazine (PHENERGAN) injection (IM or IVPB)  Anti-infectives (From admission, onward)    Start     Dose/Rate Route Frequency Ordered Stop   05/05/23 1000  valACYclovir (VALTREX) tablet 1,000 mg        1,000 mg Oral Daily 05/04/23 1227     05/02/23 0030  vancomycin (VANCOCIN) IVPB 1000 mg/200 mL premix  Status:  Discontinued        1,000 mg 200 mL/hr over 60 Minutes Intravenous Every 12 hours 05/01/23 1251 05/02/23 1744   05/01/23 2130  ampicillin (OMNIPEN) 2 g in sodium chloride 0.9 % 100 mL IVPB  Status:  Discontinued        2 g 300 mL/hr over 20 Minutes Intravenous Every 4 hours 05/01/23 2125 05/02/23 1744   05/01/23 1915  fluconazole (DIFLUCAN) tablet 300 mg        300 mg Oral Every M-W-F 05/01/23 1911     05/01/23 1500   ceFEPIme (MAXIPIME) 2 g in sodium chloride 0.9 % 100 mL IVPB  Status:  Discontinued        2 g 200 mL/hr over 30 Minutes Intravenous Every 8 hours 05/01/23 1251 05/02/23 1744   05/01/23 1200  acyclovir (ZOVIRAX) 655 mg in dextrose 5 % 100 mL IVPB  Status:  Discontinued        10 mg/kg  65.5 kg (Adjusted) 113.1 mL/hr over 60 Minutes Intravenous Every 8 hours 05/01/23 1106 05/02/23 1744   05/01/23 1030  cefTRIAXone (ROCEPHIN) 2 g in sodium chloride 0.9 % 100 mL IVPB        2 g 200 mL/hr over 30 Minutes Intravenous  Once 05/01/23 1015 05/01/23 1117   05/01/23 1030  vancomycin (VANCOREADY) IVPB 1750 mg/350 mL        1,750 mg 175 mL/hr over 120 Minutes Intravenous  Once 05/01/23 1015 05/01/23 1452              Family Communication/Anticipated D/C date and plan/Code Status   DVT prophylaxis: Place and maintain sequential compression device Start: 05/02/23 1407 SCDs Start: 05/01/23 1123     Code Status: Full Code  Family Communication: None Disposition Plan: Plan to discharge home in 2 days   Status is: Inpatient Remains inpatient appropriate because: Meningitis, headache       Subjective:   Interval events noted.  She still has a headache and pain in the back of the neck.  Neck stiffness is better.  Objective:    Vitals:   05/03/23 1944 05/03/23 2127 05/04/23 0439 05/04/23 0752  BP: 123/75  125/83 125/80  Pulse: 73  65 66  Resp: 20  20 18   Temp: 98.4 F (36.9 C)  98.5 F (36.9 C) 98.4 F (36.9 C)  TempSrc:   Oral   SpO2: 99% 99% 100% 100%  Weight:      Height:       No data found.   Intake/Output Summary (Last 24 hours) at 05/04/2023 1536 Last data filed at 05/04/2023 0554 Gross per 24 hour  Intake 2110.07 ml  Output --  Net 2110.07 ml   Filed Weights   05/01/23 1610  Weight: 81.6 kg    Exam:   GEN: NAD SKIN: Warm and dry EYES: EOMI ENT: MMM, no neck stiffness CV: RRR PULM: CTA B ABD: soft, ND, NT, +BS CNS: AAO x 3, non focal EXT: No  edema or tenderness      Data Reviewed:   I have personally reviewed following labs and imaging studies:  Labs: Labs show the following:   Basic Metabolic Panel:  Recent Labs  Lab 05/01/23 0610 05/01/23 0738 05/02/23 0407 05/03/23 0630  NA 138  --  141 144  K 3.4*  --  4.5 3.6  CL 108  --  118* 115*  CO2 22  --  17* 22  GLUCOSE 114*  --  126* 114*  BUN 6  --  8 9  CREATININE 0.90  --  0.72 0.78  CALCIUM 9.3  --  9.1 9.0  MG  --  1.7  --   --    GFR Estimated Creatinine Clearance: 88.9 mL/min (by C-G formula based on SCr of 0.78 mg/dL). Liver Function Tests: Recent Labs  Lab 05/01/23 0610  AST 42*  ALT 20  ALKPHOS 93  BILITOT 0.6  PROT 7.7  ALBUMIN 3.8   No results for input(s): "LIPASE", "AMYLASE" in the last 168 hours. No results for input(s): "AMMONIA" in the last 168 hours. Coagulation profile Recent Labs  Lab 05/01/23 0619  INR 1.1    CBC: Recent Labs  Lab 05/01/23 0610 05/02/23 0407 05/03/23 0630  WBC 5.7 8.2 8.0  NEUTROABS 3.8  --   --   HGB 12.9 11.7* 12.1  HCT 38.5 34.2* 36.7  MCV 88.7 87.7 90.2  PLT 219 208 214   Cardiac Enzymes: No results for input(s): "CKTOTAL", "CKMB", "CKMBINDEX", "TROPONINI" in the last 168 hours. BNP (last 3 results) No results for input(s): "PROBNP" in the last 8760 hours. CBG: No results for input(s): "GLUCAP" in the last 168 hours. D-Dimer: No results for input(s): "DDIMER" in the last 72 hours. Hgb A1c: No results for input(s): "HGBA1C" in the last 72 hours. Lipid Profile: No results for input(s): "CHOL", "HDL", "LDLCALC", "TRIG", "CHOLHDL", "LDLDIRECT" in the last 72 hours. Thyroid function studies: No results for input(s): "TSH", "T4TOTAL", "T3FREE", "THYROIDAB" in the last 72 hours.  Invalid input(s): "FREET3" Anemia work up: No results for input(s): "VITAMINB12", "FOLATE", "FERRITIN", "TIBC", "IRON", "RETICCTPCT" in the last 72 hours. Sepsis Labs: Recent Labs  Lab 05/01/23 0610 05/02/23 0407  05/03/23 0630  PROCALCITON <0.10  --   --   WBC 5.7 8.2 8.0  LATICACIDVEN 1.6  --   --     Microbiology Recent Results (from the past 240 hour(s))  Culture, blood (Routine x 2)     Status: None (Preliminary result)   Collection Time: 05/01/23  6:19 AM   Specimen: BLOOD  Result Value Ref Range Status   Specimen Description BLOOD BLOOD LEFT FOREARM  Final   Special Requests   Final    BOTTLES DRAWN AEROBIC AND ANAEROBIC Blood Culture adequate volume   Culture   Final    NO GROWTH 3 DAYS Performed at [facility], [address], [place], [place] [address]    Report Status PENDING  Incomplete  Culture, blood (Routine x 2)     Status: None (Preliminary result)   Collection Time: 05/01/23  6:24 AM   Specimen: BLOOD  Result Value Ref Range Status   Specimen Description BLOOD LEFT ANTECUBITAL  Final   Special Requests   Final    BOTTLES DRAWN AEROBIC AND ANAEROBIC Blood Culture adequate volume   Culture   Final    NO GROWTH 3 DAYS Performed at [facility], [address], [place], [place] [address]    Report Status PENDING  Incomplete  SARS Coronavirus 2 by RT PCR (hospital order, performed in [facility] hospital lab) *cepheid single result test* Anterior Nasal Swab     Status: None   Collection Time:  05/01/23  6:30 AM   Specimen: Anterior Nasal Swab  Result Value Ref Range Status   SARS Coronavirus 2 by RT PCR NEGATIVE NEGATIVE Final    Comment: (NOTE) SARS-CoV-2 target nucleic acids are NOT DETECTED.  The SARS-CoV-2 RNA is generally detectable in upper and lower respiratory specimens during the acute phase of infection. The lowest concentration of SARS-CoV-2 viral copies this assay can detect is 250 copies / mL. A negative result does not preclude SARS-CoV-2 infection and should not be used as the sole basis for treatment or other patient management decisions.  A negative result may occur with improper specimen collection / handling, submission of  specimen other than nasopharyngeal swab, presence of viral mutation(s) within the areas targeted by this assay, and inadequate number of viral copies (<250 copies / mL). A negative result must be combined with clinical observations, patient history, and epidemiological information.  Fact Sheet for Patients:   RoadLapTop.co.za  Fact Sheet for Healthcare Providers: http://kim-miller.com/  This test is not yet approved or  cleared by the [place] FDA and has been authorized for detection and/or diagnosis of SARS-CoV-2 by FDA under an Emergency Use Authorization (EUA).  This EUA will remain in effect (meaning this test can be used) for the duration of the COVID-19 declaration under Section 564(b)(1) of the Act, 21 U.S.C. section 360bbb-3(b)(1), unless the authorization is terminated or revoked sooner.  Performed at [facility], [address] [place], [place] [address]   Resp Panel by RT-PCR (Flu A&B, Covid)     Status: None   Collection Time: 05/01/23  6:35 AM  Result Value Ref Range Status   SARS Coronavirus 2 by RT PCR NEGATIVE NEGATIVE Final    Comment: (NOTE) SARS-CoV-2 target nucleic acids are NOT DETECTED.  The SARS-CoV-2 RNA is generally detectable in upper respiratory specimens during the acute phase of infection. The lowest concentration of SARS-CoV-2 viral copies this assay can detect is 138 copies/mL. A negative result does not preclude SARS-Cov-2 infection and should not be used as the sole basis for treatment or other patient management decisions. A negative result may occur with  improper specimen collection/handling, submission of specimen other than nasopharyngeal swab, presence of viral mutation(s) within the areas targeted by this assay, and inadequate number of viral copies(<138 copies/mL). A negative result must be combined with clinical observations, patient history, and  epidemiological information. The expected result is Negative.  Fact Sheet for Patients:  BloggerCourse.com  Fact Sheet for Healthcare Providers:  SeriousBroker.it  This test is no t yet approved or cleared by the [place] FDA and  has been authorized for detection and/or diagnosis of SARS-CoV-2 by FDA under an Emergency Use Authorization (EUA). This EUA will remain  in effect (meaning this test can be used) for the duration of the COVID-19 declaration under Section 564(b)(1) of the Act, 21 U.S.C.section 360bbb-3(b)(1), unless the authorization is terminated  or revoked sooner.       Influenza A by PCR NEGATIVE NEGATIVE Final   Influenza B by PCR NEGATIVE NEGATIVE Final    Comment: (NOTE) The Xpert Xpress SARS-CoV-2/FLU/RSV plus assay is intended as an aid in the diagnosis of influenza from Nasopharyngeal swab specimens and should not be used as a sole basis for treatment. Nasal washings and aspirates are unacceptable for Xpert Xpress SARS-CoV-2/FLU/RSV testing.  Fact Sheet for Patients: BloggerCourse.com  Fact Sheet for Healthcare Providers: SeriousBroker.it  This test is not yet approved or cleared by the [place] FDA and has been  authorized for detection and/or diagnosis of SARS-CoV-2 by FDA under an Emergency Use Authorization (EUA). This EUA will remain in effect (meaning this test can be used) for the duration of the COVID-19 declaration under Section 564(b)(1) of the Act, 21 U.S.C. section 360bbb-3(b)(1), unless the authorization is terminated or revoked.  Performed at [facility], [address] [place], [place] [address]   CSF culture w Gram Stain     Status: None   Collection Time: 05/01/23 10:20 AM   Specimen: CSF; Cerebrospinal Fluid  Result Value Ref Range Status   Specimen Description   Final    CSF Performed at [facility],  [address], [place], [place] [address]    Special Requests   Final    NONE Performed at [facility], [address] [place], [place] [address]    Gram Stain   Final    WBC SEEN NO RBC SEEN NO ORGANISMS SEEN Performed at [facility], [address], [place], [place] [address]    Culture   Final    NO GROWTH 3 DAYS Performed at [facility], [address], [place], [place] [address]    Report Status 05/04/2023 FINAL  Final  Respiratory (~20 pathogens) panel by PCR     Status: None   Collection Time: 05/01/23  2:09 PM   Specimen: Nasopharyngeal Swab; Respiratory  Result Value Ref Range Status   Adenovirus NOT DETECTED NOT DETECTED Final   Coronavirus 229E NOT DETECTED NOT DETECTED Final    Comment: (NOTE) The Coronavirus on the Respiratory Panel, DOES NOT test for the novel  Coronavirus (2019 nCoV)    Coronavirus HKU1 NOT DETECTED NOT DETECTED Final   Coronavirus NL63 NOT DETECTED NOT DETECTED Final   Coronavirus OC43 NOT DETECTED NOT DETECTED Final   Metapneumovirus NOT DETECTED NOT DETECTED Final   Rhinovirus / Enterovirus NOT DETECTED NOT DETECTED Final   Influenza A NOT DETECTED NOT DETECTED Final   Influenza B NOT DETECTED NOT DETECTED Final   Parainfluenza Virus 1 NOT DETECTED NOT DETECTED Final   Parainfluenza Virus 2 NOT DETECTED NOT DETECTED Final   Parainfluenza Virus 3 NOT DETECTED NOT DETECTED Final   Parainfluenza Virus 4 NOT DETECTED NOT DETECTED Final   Respiratory Syncytial Virus NOT DETECTED NOT DETECTED Final   Bordetella pertussis NOT DETECTED NOT DETECTED Final   Bordetella Parapertussis NOT DETECTED NOT DETECTED Final   Chlamydophila pneumoniae NOT DETECTED NOT DETECTED Final   Mycoplasma pneumoniae NOT DETECTED NOT DETECTED Final    Comment: Performed at [facility], [address] [place], [place] [address]    Procedures and diagnostic studies:  MR BRAIN W WO CONTRAST  Result Date: 05/04/2023 CLINICAL  DATA:  Headache.  History of uterine sarcoma. EXAM: MRI HEAD WITHOUT AND WITH CONTRAST TECHNIQUE: Multiplanar, multiecho pulse sequences of the brain and surrounding structures were obtained without and with intravenous contrast. CONTRAST:  8mL GADAVIST GADOBUTROL 1 MMOL/ML IV SOLN COMPARISON:  None Available. FINDINGS: Brain: No acute infarct, mass effect or extra-axial collection. No acute or chronic hemorrhage. Normal white matter signal, parenchymal volume and CSF spaces. The midline structures are normal. There is no abnormal contrast enhancement. Vascular: Major flow voids are preserved. Skull and upper cervical spine: Normal calvarium and skull base. Visualized upper cervical spine and soft tissues are normal. Sinuses/Orbits:No paranasal sinus fluid levels or advanced mucosal thickening. No mastoid or middle ear effusion. Normal orbits. IMPRESSION: Normal brain MRI. Electronically Signed   By:  Deatra Robinson M.D.   On: 05/04/2023 02:12               LOS: 3 days   Calisa Luckenbaugh  [facility]   Pager on [email or web address]. If 7PM-7AM, please contact night-coverage at [email or web address]     05/04/2023, 3:36 PM

## 2023-05-04 NOTE — Progress Notes (Signed)
Date of Admission:  05/01/2023      ID: [name] Horn is a 47 y.o. 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 Active Problems:   History of asthma   Eosinophilic esophagitis   Antibody deficiency syndrome (HCC)   HLD (hyperlipidemia)   Obesity with body mass index (BMI) of 30.0 to 39.9   RA (rheumatoid arthritis) (HCC)   Migraine   Oral thrush   Hypokalemia   Sepsis (HCC)  Mom at bed side  Subjective: Feeling  a little better today Headache better Still feel very exhausted Sleep better Some nausea   Medications:   amphetamine-dextroamphetamine  20 mg Oral TID   baclofen  10 mg Oral QHS   cyclobenzaprine  10 mg Oral QHS   cycloSPORINE  1 drop Both Eyes BID   famotidine  40 mg Oral QHS   fluconazole  300 mg Oral Q M,W,F   folic acid  2 mg Oral Daily   metoCLOPramide  10 mg Oral BID   metoprolol succinate  50 mg Oral Daily   mometasone-formoterol  2 puff Inhalation BID   nystatin  10 mL Oral TID   olopatadine  1 drop Both Eyes BID   pantoprazole  40 mg Oral BID   polyvinyl alcohol  1 drop Both Eyes BID   pravastatin  40 mg Oral QHS   saccharomyces boulardii  250 mg Oral BID   topiramate  100 mg Oral Daily   topiramate  25 mg Oral QHS   [START ON 05/05/2023] valACYclovir  1,000 mg Oral Daily    Objective: Vital signs in last 24 hours: Patient Vitals for the past 24 hrs:  BP Temp Temp src Pulse Resp SpO2  05/04/23 0752 125/80 98.4 F (36.9 C) -- 66 18 100 %  05/04/23 0439 125/83 98.5 F (36.9 C) Oral 65 20 100 %  05/03/23 2127 -- -- -- -- -- 99 %  05/03/23 1944 123/75 98.4 F (36.9 C) -- 73 20 99 %  05/03/23 1634 128/82 98.2 F (36.8 C) -- 71 18 98 %      PHYSICAL EXAM:  General: Awake, Alert, cooperative, some discomfort Head: Normocephalic, without obvious abnormality, atraumatic. Eyes: Conjunctivae clear, anicteric sclerae. Pupils are equal ENT Nares normal. No drainage or sinus tenderness. Tongue- geographic tongue Neck: able  to flex neck  symmetrical, no adenopathy, thyroid: non tender no carotid bruit and no JVD. Back: No CVA tenderness. Lungs: Clear to auscultation bilaterally. No Wheezing or Rhonchi. No rales. Heart: Regular rate and rhythm, no murmur, rub or gallop. Abdomen: Soft, non-tender,not distended. Bowel sounds normal. No masses Extremities: atraumatic, no cyanosis. No edema. No clubbing Skin: No rashes or lesions. Or bruising Lymph: Cervical, supraclavicular normal. Neurologic: Grossly non-focal  Lab Results    Latest Ref Rng & Units 05/03/2023    6:30 AM 05/02/2023    4:07 AM 05/01/2023    6:10 AM  CBC  WBC 4.0 - 10.5 K/uL 8.0  8.2  5.7   Hemoglobin 12.0 - 15.0 g/dL 16.1  09.6  04.5   Hematocrit 36.0 - 46.0 % 36.7  34.2  38.5   Platelets 150 - 400 K/uL 214  208  219        Latest Ref Rng & Units 05/03/2023    6:30 AM 05/02/2023    4:07 AM 05/01/2023    6:10 AM  CMP  Glucose 70 - 99 mg/dL 409  811  914   BUN 6 - 20  mg/dL 9  8  6    Creatinine 0.44 - 1.00 mg/dL 9.14  7.82  9.56   Sodium 135 - 145 mmol/L 144  141  138   Potassium 3.5 - 5.1 mmol/L 3.6  4.5  3.4   Chloride 98 - 111 mmol/L 115  118  108   CO2 22 - 32 mmol/L 22  17  22    Calcium 8.9 - 10.3 mg/dL 9.0  9.1  9.3   Total Protein 6.5 - 8.1 g/dL   7.7   Total Bilirubin 0.3 - 1.2 mg/dL   0.6   Alkaline Phos 38 - 126 U/L   93   AST 15 - 41 U/L   42   ALT 0 - 44 U/L   20       Microbiology: CSF ME panel neg BC NEg Studies/Results: MR BRAIN W WO CONTRAST  Result Date: 05/04/2023 CLINICAL DATA:  Headache.  History of uterine sarcoma. EXAM: MRI HEAD WITHOUT AND WITH CONTRAST TECHNIQUE: Multiplanar, multiecho pulse sequences of the brain and surrounding structures were obtained without and with intravenous contrast. CONTRAST:  8mL GADAVIST GADOBUTROL 1 MMOL/ML IV SOLN COMPARISON:  None Available. FINDINGS: Brain: No acute infarct, mass effect or extra-axial collection. No acute or chronic hemorrhage. Normal white matter signal,  parenchymal volume and CSF spaces. The midline structures are normal. There is no abnormal contrast enhancement. Vascular: Major flow voids are preserved. Skull and upper cervical spine: Normal calvarium and skull base. Visualized upper cervical spine and soft tissues are normal. Sinuses/Orbits:No paranasal sinus fluid levels or advanced mucosal thickening. No mastoid or middle ear effusion. Normal orbits. IMPRESSION: Normal brain MRI. Electronically Signed   By: Deatra Robinson M.D.   On: 05/04/2023 02:12     Assessment/Plan: Aseptic meningitis- No common bacterial or viral etiology identified. Need to consider IVIG induced aseptic  meningitis in the DD, though rare ,cases have been reported, because of the following reasons for the past 6 cycles she has been very exhausted after IVIG Gammagard and higher dose of 40gms compared to privigen( 30gms )infusion before She has migraine and that puts her at a higher risk for IVIG induced AM. 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

## 2023-05-04 NOTE — Unmapped (Signed)
[facility] Refill Coordination Note    Specialty Medication(s) to be Shipped:   Inflammatory Disorders: Dupixent    Other medication(s) to be shipped: No additional medications requested for fill at this time     Julia Contreras, DOB: 1976-09-03  Phone: [phone] (home)       All above HIPAA information was verified with patient.     Was a Nurse, learning disability used for this call? No    Completed refill call assessment today to schedule patient's medication shipment from the [facility] [phone]).  All relevant notes have been reviewed.     Specialty medication(s) and dose(s) confirmed: Regimen is correct and unchanged. Patient currently has Aseptic Meningitis that she is being treated for. Patient will continue with weekly Friday injections of Dupixent after questions for Pharmacist about continuing injections.  Changes to medications: Kayren reports no changes at this time.  Changes to insurance: No  New side effects reported not previously addressed with a pharmacist or physician: None reported  Questions for the pharmacist: No    Confirmed patient received a Conservation officer, historic buildings and a Surveyor, mining with first shipment. The patient will receive a drug information handout for each medication shipped and additional FDA Medication Guides as required.       DISEASE/MEDICATION-SPECIFIC INFORMATION        For patients on injectable medications: Patient currently has 1 doses left.  Next injection is scheduled for 05/05/23.    SPECIALTY MEDICATION ADHERENCE     Medication Adherence    Patient reported X missed doses in the last month: 0  Specialty Medication: dupilumab (DUPIXENT SYRINGE) 300 mg/2 mL Syrg injection  Patient is on additional specialty medications: No  Informant: patient              Were doses missed due to medication being on hold? No    DUPIXENT SYRINGE 300 mg/2 mL   : 1 days of medicine on hand       REFERRAL TO PHARMACIST     Referral to the pharmacist: Not needed      [facility]     Shipping address confirmed in Epic.     Patient was notified of new phone menu : No    Delivery Scheduled: Yes, Expected medication delivery date: 05/09/23.     Medication will be delivered via UPS to the prescription address in Epic WAM.    Alwyn Pea   [facility] Specialty Technician

## 2023-05-05 ENCOUNTER — Inpatient Hospital Stay: Payer: BC Managed Care – PPO

## 2023-05-05 DIAGNOSIS — G43909 Migraine, unspecified, not intractable, without status migrainosus: Secondary | ICD-10-CM | POA: Diagnosis not present

## 2023-05-05 DIAGNOSIS — T50905A Adverse effect of unspecified drugs, medicaments and biological substances, initial encounter: Secondary | ICD-10-CM | POA: Diagnosis not present

## 2023-05-05 DIAGNOSIS — G030 Nonpyogenic meningitis: Secondary | ICD-10-CM | POA: Diagnosis not present

## 2023-05-05 LAB — CULTURE, BLOOD (ROUTINE X 2): Culture: NO GROWTH

## 2023-05-05 MED ORDER — ORAL CARE MOUTH RINSE: 15.0000 mL | OROMUCOSAL | Status: DC | PRN

## 2023-05-05 MED ORDER — IOHEXOL 350 MG/ML SOLN
75.0000 mL | Freq: Once | INTRAVENOUS | Status: AC | PRN
  Administered 2023-05-05: 75 mL via INTRAVENOUS

## 2023-05-05 MED ORDER — PREGABALIN 25 MG PO CAPS
25.0000 mg | ORAL_CAPSULE | Freq: Two times a day (BID) | ORAL | Status: DC
  Administered 2023-05-05 – 2023-05-06 (×3): 25 mg via ORAL
  Filled 2023-05-05 (×3): qty 1

## 2023-05-05 NOTE — Consult Note (Signed)
NEUROLOGY CONSULTATION NOTE   Date of service: May 05, 2023 Patient Name: [name] Horn MRN:  [id number] DOB:  1976-05-21 Reason for consult: headache in patient with meningitis Requesting physician: Dr. 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 She had neck stiffness radiating to the occipital area. She was feelingt nauseated. LP in ED showed WBC 65, 623, with seg 74,82; RBC 94,25, protein 75, gluc 53. Meningitis/encephalitis panel was negative. ID is consulted. She reports persistent neck stiffness and headache despite sumatriptan, oxycodone, morphine, baclofen, flexeril, reglan, and topiramate.   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   Herpes labialis 08/12/2020   IBD (inflammatory bowel disease)    Intertrigo 06/22/2020   Leukopenia 07/06/2021   Lymphedema    Malaise 12/30/2020   RA (rheumatoid arthritis) (HCC)    Rheumatoid arthritis (HCC) 08/12/2020   Transaminitis 08/12/2020   Uterine cancer (HCC)    Past Surgical History:  Procedure Laterality Date   APPENDECTOMY     cheek biopsy  11/2020   CHOLECYSTECTOMY     ESOPHAGOGASTRODUODENOSCOPY N/A 04/30/2017   Procedure: ESOPHAGOGASTRODUODENOSCOPY (EGD);  Surgeon: Dorena Cookey, MD;  Location: [facility]  ENDOSCOPY;  Service: Endoscopy;  Laterality: N/A;   HERNIA REPAIR     x2   KNEE SURGERY     x3   MINIMALLY INVASIVE FORAMINOTOMY CERVICAL SPINE     C6-T1, Nitka   SAVORY DILATION N/A 04/30/2017   Procedure: SAVORY DILATION;  Surgeon: Dorena Cookey, MD;  Location: [facility] ENDOSCOPY;  Service: Endoscopy;  Laterality: N/A;   TONSILLECTOMY     TOTAL ABDOMINAL HYSTERECTOMY     Sarcoma, s/p XRT   Family History  Problem Relation Age of Onset   Hashimoto's thyroiditis Mother    Eczema Mother    Angioedema Mother    Colonic polyp Mother    Throat cancer Father    Arrhythmia Father    Angioedema Maternal Grandfather    Hypertension Other    Hyperlipidemia Other    Colon cancer Other        grandmother   Social History   Socioeconomic History   Marital status: Married    Spouse name: Not on file   Number of children: Not on file   Years of education: Not on file   Highest education level: Not on file  Occupational History   Not on file  Tobacco Use   Smoking status: Never    Passive exposure: Yes   Smokeless tobacco: Never  Vaping Use   Vaping Use: Never used  Substance and Sexual Activity   Alcohol use: Not Currently   Drug use: Yes  Comment: prescribed oxycodone   Sexual activity: Yes    Partners: Male    Birth control/protection: None  Other Topics Concern   Not on file  Social History Narrative   Not on file   Social Determinants of Health   Financial Resource Strain: Not on file  Food Insecurity: No Food Insecurity (05/02/2023)   Hunger Vital Sign    Worried About Running Out of Food in the Last Year: Never true    Ran Out of Food in the Last Year: Never true  Transportation Needs: No Transportation Needs (05/02/2023)   PRAPARE - Administrator, Civil Service (Medical): No    Lack of Transportation (Non-Medical): No  Physical Activity: Not on file  Stress: Not on file  Social Connections: Not on file   Allergies  Allergen Reactions   Cyanoacrylate  Hives   Other Itching and Rash    Dermabond surgical glue.  Dermabond surgical glue-blisters   Sulfa Antibiotics Anaphylaxis, Rash, Shortness Of Breath and Swelling    Angioedema (also)   Sulfonamide Derivatives Hives, Shortness Of Breath and Swelling    TONGUE SWELLS   Benadryl [Diphenhydramine Hcl] Other (See Comments)    Hyperactivity    Sulfamethoxazole     Other Reaction(s): Angioedema   Diphenhydramine    Diphenhydramine Hcl     Other Reaction(s): Other (See Comments)  HYPERACTIVITY, RESTLESS LEG   Silicone Rash    Watch band    Medications   Medications Prior to Admission  Medication Sig Dispense Refill Last Dose   albuterol (PROVENTIL) (2.5 MG/3ML) 0.083% nebulizer solution Take 3 mLs (2.5 mg total) by nebulization every 4 (four) hours as needed for wheezing or shortness of breath. 75 mL 3 unknown   albuterol (VENTOLIN HFA) 108 (90 Base) MCG/ACT inhaler Inhale 1 puff into the lungs every 4 (four) hours as needed.   unknown   alosetron (LOTRONEX) 0.5 MG tablet Take 0.5 mg by mouth as needed (rash).   unknown   amphetamine-dextroamphetamine (ADDERALL) 20 MG tablet Take 20 mg by mouth 3 (three) times daily.   04/30/2023   atropine 1 % ophthalmic solution Place 1 drop into the left eye daily as needed (for dry eye).   unkown   baclofen (LIORESAL) 10 MG tablet Take 10 mg by mouth at bedtime.   04/30/2023   Bepotastine Besilate 1.5 % SOLN Place 1 drop into both eyes 2 (two) times a day.   04/30/2023   budesonide-formoterol (SYMBICORT) 80-4.5 MCG/ACT inhaler Inhale 2 puffs into the lungs 2 (two) times daily. Can also take Q4 hr as needed for wheezing 10.2 each 1 unknown   cyclobenzaprine (FLEXERIL) 10 MG tablet Take 1 tablet (10 mg total) by mouth at bedtime. 30 tablet 0 unknown   cycloSPORINE (RESTASIS) 0.05 % ophthalmic emulsion Place 1 drop into both eyes 2 (two) times daily.   04/30/2023   dicyclomine (BENTYL) 10 MG capsule Take 10 mg by mouth daily as needed for nausea/vomiting.    unknown   Dupilumab 300 MG/2ML SOPN Inject 300 mg into the skin once a week. Friday   04/24/2023   famotidine (PEPCID) 40 MG tablet Take 40 mg by mouth at bedtime.   04/30/2023   fluconazole (DIFLUCAN) 150 MG tablet Take 300 mg by mouth every Monday, Wednesday, and Friday.   04/28/2023   folic acid (FOLVITE) 1 MG tablet Take 2 tablets (2 mg total) by mouth daily. 180 tablet 3 04/30/2023   furosemide (LASIX) 20 MG tablet Take 60 mg  by mouth 2 (two) times daily.   04/30/2023   LORazepam (ATIVAN) 1 MG tablet Take 1 mg by mouth 3 (three) times daily as needed for anxiety.   04/30/2023   magic mouthwash (nystatin, lidocaine, diphenhydrAMINE, alum & mag hydroxide) suspension Swish and spit 5 mLs 3 (three) times daily as needed for mouth pain. 180 mL 1 04/30/2023   meloxicam (MOBIC) 15 MG tablet Take 1 tablet (15 mg total) by mouth daily. (Patient taking differently: Take 15 mg by mouth as needed for pain.) 14 tablet 0 unknown   metoCLOPramide (REGLAN) 10 MG tablet Take 10 mg by mouth 2 (two) times daily.   04/30/2023   metoprolol succinate (TOPROL-XL) 25 MG 24 hr tablet Take 50 mg by mouth daily.   04/30/2023   nystatin (MYCOSTATIN) 100000 UNIT/ML suspension Take 10 mLs (1,000,000 Units total) by mouth 3 (three) times daily. 60 mL 2 unknown   nystatin powder Apply 1 application topically 2 (two) times daily as needed (rash).   unknown   ondansetron (ZOFRAN-ODT) 4 MG disintegrating tablet Take 1 tablet (4 mg total) by mouth every 8 (eight) hours as needed for nausea or vomiting. 12 tablet 0 unknown   oxyCODONE-acetaminophen (PERCOCET) 10-325 MG tablet Take 1 tablet by mouth every 4 (four) hours as needed for pain.   04/30/2023   pantoprazole (PROTONIX) 40 MG tablet Take 1 tablet (40 mg total) by mouth 2 (two) times daily. 60 tablet 1 04/30/2023   Polyethyl Glycol-Propyl Glycol (SYSTANE) 0.4-0.3 % GEL ophthalmic gel Place 1 application into both eyes in the morning and at bedtime.   04/30/2023   Potassium Chloride ER 20 MEQ TBCR  Take 20 mEq by mouth daily as needed (when taking fursoemide).   04/30/2023   pravastatin (PRAVACHOL) 40 MG tablet Take 40 mg by mouth at bedtime.   04/30/2023   prednisoLONE acetate (PRED FORTE) 1 % ophthalmic suspension Place 1 drop into the left eye as needed (conjunctivitis).   unknown   promethazine (PHENERGAN) 25 MG tablet Take 1 tablet (25 mg total) by mouth every 6 (six) hours as needed for nausea or vomiting. 30 tablet 0 unknown   SUMAtriptan (IMITREX) 25 MG tablet Take 25 mg by mouth every 2 (two) hours as needed for migraine. May repeat in 2 hours if headache persists or recurs.   unknown   topiramate (TOPAMAX) 100 MG tablet Take 100 mg by mouth daily.   04/30/2023   topiramate (TOPAMAX) 25 MG tablet Take 25 mg by mouth at bedtime.    04/30/2023   triamcinolone lotion (KENALOG) 0.1 % Apply 1 application topically daily as needed (rash).   unknown   valACYclovir (VALTREX) 1000 MG tablet Take 1 tablet (1,000 mg total) by mouth daily. 30 tablet 5 unknown   oxyCODONE (ROXICODONE) 5 MG immediate release tablet Take 1 tablet (5 mg total) by mouth every 8 (eight) hours as needed for breakthrough pain (in addition to your baseline medications). (Patient not taking: Reported on 10/04/2022) 8 tablet 0    sucralfate (CARAFATE) 1 g tablet Take 1 tablet (1 g total) by mouth 4 (four) times daily for 7 days. 28 tablet 0    traZODone (DESYREL) 150 MG tablet Take 150 mg by mouth at bedtime as needed for sleep. (Patient not taking: Reported on 05/01/2023)   Not Taking      Current Facility-Administered Medications:    acetaminophen (TYLENOL) tablet 650 mg, 650 mg, Oral, Q6H PRN, Lorretta Harp, MD, 650 mg at 05/01/23 1240   albuterol (  PROVENTIL) (2.5 MG/3ML) 0.083% nebulizer solution 3 mL, 3 mL, Inhalation, Q4H PRN, Lorretta Harp, MD, 3 mL at 05/03/23 2127   amphetamine-dextroamphetamine (ADDERALL) tablet 20 mg, 20 mg, Oral, TID, Lorretta Harp, MD, 20 mg at 05/02/23 1543   baclofen (LIORESAL) tablet 10 mg, 10 mg, Oral, QHS,  Lorretta Harp, MD, 10 mg at 05/04/23 2155   cyclobenzaprine (FLEXERIL) tablet 10 mg, 10 mg, Oral, QHS, Jaynie Bream, RPH, 10 mg at 05/04/23 2155   cycloSPORINE (RESTASIS) 0.05 % ophthalmic emulsion 1 drop, 1 drop, Both Eyes, BID, Lorretta Harp, MD, 1 drop at 05/05/23 1610   dextromethorphan-guaiFENesin (MUCINEX DM) 30-600 MG per 12 hr tablet 1 tablet, 1 tablet, Oral, BID PRN, Lorretta Harp, MD, 1 tablet at 05/03/23 0546   dicyclomine (BENTYL) capsule 10 mg, 10 mg, Oral, Daily PRN, Lorretta Harp, MD   famotidine (PEPCID) tablet 40 mg, 40 mg, Oral, QHS, Lorretta Harp, MD, 40 mg at 05/04/23 2155   fluconazole (DIFLUCAN) tablet 300 mg, 300 mg, Oral, Q M,W,F, Lorretta Harp, MD, 300 mg at 05/05/23 9604   folic acid (FOLVITE) tablet 2 mg, 2 mg, Oral, Daily, Lorretta Harp, MD, 2 mg at 05/05/23 0850   LORazepam (ATIVAN) tablet 1 mg, 1 mg, Oral, TID PRN, Lorretta Harp, MD, 1 mg at 05/03/23 2125   magic mouthwash w/lidocaine, 5 mL, Oral, TID PRN, Foye Deer, RPH   metoCLOPramide (REGLAN) tablet 10 mg, 10 mg, Oral, BID, Jaynie Bream, RPH, 10 mg at 05/05/23 0851   metoprolol succinate (TOPROL-XL) 24 hr tablet 50 mg, 50 mg, Oral, Daily, Lurene Shadow, MD, 50 mg at 05/05/23 0851   mometasone-formoterol (DULERA) 100-5 MCG/ACT inhaler 2 puff, 2 puff, Inhalation, BID, Lorretta Harp, MD, 2 puff at 05/05/23 0854   morphine (PF) 2 MG/ML injection 2 mg, 2 mg, Intravenous, Q4H PRN, Lorretta Harp, MD, 2 mg at 05/04/23 1432   nystatin (MYCOSTATIN) 100000 UNIT/ML suspension 1,000,000 Units, 10 mL, Oral, TID, Jaynie Bream, RPH, 1,000,000 Units at 05/05/23 0854   olopatadine (PATANOL) 0.1 % ophthalmic solution 1 drop, 1 drop, Both Eyes, BID, Lorretta Harp, MD, 1 drop at 05/05/23 0856   ondansetron (ZOFRAN) injection 4 mg, 4 mg, Intravenous, Q8H PRN, Lorretta Harp, MD, 4 mg at 05/05/23 0329   oxyCODONE-acetaminophen (PERCOCET/ROXICET) 5-325 MG per tablet 1 tablet, 1 tablet, Oral, Q4H PRN, Lorretta Harp, MD, 1 tablet at 05/05/23 0851    pantoprazole (PROTONIX) EC tablet 40 mg, 40 mg, Oral, BID, Lorretta Harp, MD, 40 mg at 05/05/23 5409   polyvinyl alcohol (LIQUIFILM TEARS) 1.4 % ophthalmic solution 1 drop, 1 drop, Both Eyes, BID, Lorretta Harp, MD, 1 drop at 05/05/23 0854   pravastatin (PRAVACHOL) tablet 40 mg, 40 mg, Oral, QHS, Lorretta Harp, MD, 40 mg at 05/04/23 2155   promethazine (PHENERGAN) 12.5 mg in sodium chloride 0.9 % 50 mL IVPB, 12.5 mg, Intravenous, Q6H PRN, Lurene Shadow, MD   saccharomyces boulardii (FLORASTOR) capsule 250 mg, 250 mg, Oral, BID, Lorretta Harp, MD, 250 mg at 05/05/23 0851   SUMAtriptan (IMITREX) tablet 25 mg, 25 mg, Oral, Q2H PRN, Lorretta Harp, MD, 25 mg at 05/05/23 0330   topiramate (TOPAMAX) tablet 100 mg, 100 mg, Oral, Daily, Lorretta Harp, MD, 100 mg at 05/05/23 0851   topiramate (TOPAMAX) tablet 25 mg, 25 mg, Oral, QHS, Lorretta Harp, MD, 25 mg at 05/04/23 2157   traZODone (DESYREL) tablet 50 mg, 50 mg, Oral, QHS PRN, Lorretta Harp, MD   valACYclovir (VALTREX) tablet 1,000 mg, 1,000 mg, Oral,  Daily, Jaynie Bream, RPH, 1,000 mg at 05/05/23 0850  Vitals   Vitals:   05/04/23 0752 05/04/23 1616 05/04/23 2025 05/05/23 0500  BP: 125/80 104/70 115/68 124/83  Pulse: 66 60 68 62  Resp: 18 16 17 16   Temp: 98.4 F (36.9 C) 98.4 F (36.9 C) 98.2 F (36.8 C) 98 F (36.7 C)  TempSrc:  Oral Oral Oral  SpO2: 100% 98% 98% 97%  Weight:      Height:         Body mass index is 30.9 kg/m.  Physical Exam    Gen: patient lying in bed, NAD CV: extremities appear well-perfused Resp: normal WOB  Neurologic exam MS: alert, oriented x4, follows commands Speech: no dysarthria, no aphasia CN: PERRL, VFF, EOMI, sensation intact, face symmetric, hearing intact to voice Motor: 5/5 strength throughout Sensory: SILT Reflexes: 2+ symm with toes down bilat Coordination: FNF intact bilat Gait: deferred   Labs   CBC:  Recent Labs  Lab 05/01/23 0610 05/02/23 0407 05/03/23 0630  WBC 5.7 8.2 8.0  NEUTROABS 3.8   --   --   HGB 12.9 11.7* 12.1  HCT 38.5 34.2* 36.7  MCV 88.7 87.7 90.2  PLT 219 208 214    Basic Metabolic Panel:  Lab Results  Component Value Date   NA 144 05/03/2023   K 3.6 05/03/2023   CO2 22 05/03/2023   GLUCOSE 114 (H) 05/03/2023   BUN 9 05/03/2023   CREATININE 0.78 05/03/2023   CALCIUM 9.0 05/03/2023   GFRNONAA >60 05/03/2023   GFRAA 98 12/30/2020   Lipid Panel: No results found for: "LDLCALC" HgbA1c: No results found for: "HGBA1C" Urine Drug Screen:     Component Value Date/Time   LABOPIA POSITIVE (A) 04/26/2017 0352   COCAINSCRNUR NONE DETECTED 04/26/2017 0352   LABBENZ NONE DETECTED 04/26/2017 0352   AMPHETMU NONE DETECTED 04/26/2017 0352   THCU NONE DETECTED 04/26/2017 0352   LABBARB NONE DETECTED 04/26/2017 0352    Alcohol Level No results found for: "ETH"   MRI Brain wwo: normal (personal review)   Impression   This is a 47 yo woman with 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 Leading etiologies on the differential are viral infection with an agent not tested on the ME panel or IVIG-related drug reaction. 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 She does not have any other features typically associated with autoimmune encephalopathy such as cognitive or behavioral change or seizures. Moreover cell count is higher than would be expected in paraneoplastic syndrome. There is no CSF left in lab to test. We can run a paraneoplastic panel on serum but my suspicion is not high enough to warrant retapping her at this time. We will also run a ANA comprehensive panel to identify any other possible autoimmune etiologies. With regards to her headache, it is worse when  laying down, therefore will performed CTV to r/o venous sinus thrombosis. In the meantime will add lyrica to see if this improves her headaches.   Recommendations   - Serum paraneoplastic panel - ANA comprehensive panel - CTV head - Lyrica 25mg  bid  Will continue to follow ______________________________________________________________________   Thank you for the opportunity to take part in the care  of this patient. If you have any further questions, please contact the neurology consultation attending.  Signed,  Bing Neighbors, MD [facility] [phone]  If 7pm- 7am, please page neurology on call as listed in AMION.  **Any copied and pasted documentation in this note was written by me in another application not billed for and pasted by me into this document.

## 2023-05-05 NOTE — Plan of Care (Signed)
Please see neurology consult note from yesterday. Neuro will f/u tomorrow when CTV is completed.  Bing Neighbors, MD [facility] [phone]  If 7pm- 7am, please page neurology on call as listed in AMION.

## 2023-05-05 NOTE — Progress Notes (Signed)
Date of Admission:  05/01/2023      ID: [name] Horn is a 47 y.o. 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 Active Problems:   History of asthma   Eosinophilic esophagitis   Antibody deficiency syndrome (HCC)   HLD (hyperlipidemia)   Obesity with body mass index (BMI) of 30.0 to 39.9   RA (rheumatoid arthritis) (HCC)   Migraine   Oral thrush   Hypokalemia   Sepsis (HCC)    Subjective: Having headaches   Medications:   amphetamine-dextroamphetamine  20 mg Oral TID   baclofen  10 mg Oral QHS   cyclobenzaprine  10 mg Oral QHS   cycloSPORINE  1 drop Both Eyes BID   famotidine  40 mg Oral QHS   fluconazole  300 mg Oral Q M,W,F   folic acid  2 mg Oral Daily   metoCLOPramide  10 mg Oral BID   metoprolol succinate  50 mg Oral Daily   mometasone-formoterol  2 puff Inhalation BID   nystatin  10 mL Oral TID   olopatadine  1 drop Both Eyes BID   pantoprazole  40 mg Oral BID   polyvinyl alcohol  1 drop Both Eyes BID   pravastatin  40 mg Oral QHS   pregabalin  25 mg Oral BID   saccharomyces boulardii  250 mg Oral BID   topiramate  100 mg Oral Daily   topiramate  25 mg Oral QHS   valACYclovir  1,000 mg Oral Daily    Objective: Vital signs in last 24 hours: Patient Vitals for the past 24 hrs:  BP Temp Temp src Pulse Resp SpO2  05/05/23 1700 99/72 98.1 F (36.7 C) Oral 71 18 95 %  05/05/23 0938 (!) 109/57 98.4 F (36.9 C) Oral 76 18 97 %  05/05/23 0500 124/83 98 F (36.7 C) Oral 62 16 97 %  05/04/23 2025 115/68 98.2 F (36.8 C) Oral 68 17 98 %      PHYSICAL EXAM:  General: Awake, Alert, cooperative,  Head: Normocephalic, without obvious abnormality, atraumatic. Eyes: Conjunctivae clear, anicteric sclerae. Pupils are equal ENT Nares normal. No drainage or sinus tenderness. Tongue- geographic tongue Neck: able to flex neck  symmetrical, no adenopathy, thyroid: non tender no carotid bruit and no JVD. Back: No CVA tenderness. Lungs: Clear to  auscultation bilaterally. No Wheezing or Rhonchi. No rales. Heart: Regular rate and rhythm, no murmur, rub or gallop. Abdomen: Soft, non-tender,not distended. Bowel sounds normal. No masses Extremities: atraumatic, no cyanosis. No edema. No clubbing Skin: No rashes or lesions. Or bruising Lymph: Cervical, supraclavicular normal. Neurologic: Grossly non-focal  Lab Results    Latest Ref Rng & Units 05/03/2023    6:30 AM 05/02/2023    4:07 AM 05/01/2023    6:10 AM  CBC  WBC 4.0 - 10.5 K/uL 8.0  8.2  5.7   Hemoglobin 12.0 - 15.0 g/dL 16.1  09.6  04.5   Hematocrit 36.0 - 46.0 % 36.7  34.2  38.5   Platelets 150 - 400 K/uL 214  208  219        Latest Ref Rng & Units 05/03/2023    6:30 AM 05/02/2023    4:07 AM 05/01/2023    6:10 AM  CMP  Glucose 70 - 99 mg/dL 409  811  914   BUN 6 - 20 mg/dL 9  8  6    Creatinine 0.44 - 1.00 mg/dL 7.82  9.56  2.13   Sodium 135 -  145 mmol/L 144  141  138   Potassium 3.5 - 5.1 mmol/L 3.6  4.5  3.4   Chloride 98 - 111 mmol/L 115  118  108   CO2 22 - 32 mmol/L 22  17  22    Calcium 8.9 - 10.3 mg/dL 9.0  9.1  9.3   Total Protein 6.5 - 8.1 g/dL   7.7   Total Bilirubin 0.3 - 1.2 mg/dL   0.6   Alkaline Phos 38 - 126 U/L   93   AST 15 - 41 U/L   42   ALT 0 - 44 U/L   20       Microbiology: CSF ME panel neg BC NEg Studies/Results: MR BRAIN W WO CONTRAST  Result Date: 05/04/2023 CLINICAL DATA:  Headache.  History of uterine sarcoma. EXAM: MRI HEAD WITHOUT AND WITH CONTRAST TECHNIQUE: Multiplanar, multiecho pulse sequences of the brain and surrounding structures were obtained without and with intravenous contrast. CONTRAST:  8mL GADAVIST GADOBUTROL 1 MMOL/ML IV SOLN COMPARISON:  None Available. FINDINGS: Brain: No acute infarct, mass effect or extra-axial collection. No acute or chronic hemorrhage. Normal white matter signal, parenchymal volume and CSF spaces. The midline structures are normal. There is no abnormal contrast enhancement. Vascular: Major flow voids are  preserved. Skull and upper cervical spine: Normal calvarium and skull base. Visualized upper cervical spine and soft tissues are normal. Sinuses/Orbits:No paranasal sinus fluid levels or advanced mucosal thickening. No mastoid or middle ear effusion. Normal orbits. IMPRESSION: Normal brain MRI. Electronically Signed   By: Deatra Robinson M.D.   On: 05/04/2023 02:12     Assessment/Plan: Aseptic meningitis- No common bacterial or viral etiology identified. Fungal smear negative Waiting for fungal culture which I doubt will be positive Need to consider IVIG induced aseptic  meningitis in the DD, though rare ,cases have been reported, because of the following reasons for the past 6 cycles she has been very exhausted after IVIG Gammagard and higher dose of 40gms compared to privigen( 30gms )infusion before She has migraine and that puts her at a higher risk for IVIG induced AM. 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

## 2023-05-05 NOTE — Progress Notes (Signed)
Progress Note    [name] Horn  UEA:[id number] DOB: 10-Mar-1976  DOA: 05/01/2023 PCP: [facility]      Brief Narrative:    Medical records reviewed and are as summarized below:  [name] Horn is a 47 y.o. 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 difficile colitis, rheumatoid arthritis, herpes labialis, gastroparesis, left leg DVT 2011 not on anticoagulants.  She presented to the hospital because of headache, fever and chills.       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   Sepsis secondary to acute aseptic meningitis: Meningitis/encephalitis panel was negative.  ID recommended discontinuation of antibiotics and acyclovir (discontinued on 05/02/2023).  IV immunoglobulin suspected as the cause of meningitis.   Antibody deficiency syndrome: She receives IV immunoglobulin every month.  Last dose was on 04/28/2023.  She had received 3 doses previously.   Refractory headache: MRI brain with and without contrast was unremarkable.  Patient also has history of migraine headache.  CT venogram has been ordered for further evaluation.  Paraneoplastic antibodies have been ordered as well.  Lyrica has been ordered.  Follow-up with neurologist.    Poor oral intake: Appetite is improving.    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 difficile colitis.    Diet Order             Diet Heart Fluid consistency: Thin  Diet effective now                            Consultants: ID specialist Neurologist  Procedures: None    Medications:    amphetamine-dextroamphetamine  20 mg Oral TID   baclofen  10 mg Oral QHS   cyclobenzaprine  10 mg Oral QHS   cycloSPORINE  1 drop Both Eyes BID   famotidine  40 mg Oral QHS   fluconazole  300 mg Oral Q M,W,F   folic acid  2 mg Oral Daily   metoCLOPramide  10 mg Oral BID   metoprolol succinate  50 mg Oral Daily   mometasone-formoterol  2 puff Inhalation BID   nystatin  10 mL Oral TID   olopatadine  1 drop Both Eyes BID   pantoprazole  40 mg Oral BID   polyvinyl alcohol  1 drop Both Eyes BID   pravastatin  40 mg Oral QHS   pregabalin  25 mg Oral BID   saccharomyces boulardii  250 mg Oral BID   topiramate  100 mg Oral Daily   topiramate  25 mg Oral QHS   valACYclovir  1,000 mg Oral Daily   Continuous Infusions:  promethazine (PHENERGAN) injection (IM or IVPB)       Anti-infectives (From admission, onward)    Start     Dose/Rate Route Frequency Ordered Stop  05/05/23 1000  valACYclovir (VALTREX) tablet 1,000 mg        1,000 mg Oral Daily 05/04/23 1227     05/02/23 0030  vancomycin (VANCOCIN) IVPB 1000 mg/200 mL premix  Status:  Discontinued        1,000 mg 200 mL/hr over 60 Minutes Intravenous Every 12 hours 05/01/23 1251 05/02/23 1744   05/01/23 2130  ampicillin (OMNIPEN) 2 g in sodium chloride 0.9 % 100 mL IVPB  Status:  Discontinued        2 g 300 mL/hr over 20 Minutes Intravenous Every 4 hours 05/01/23 2125 05/02/23 1744   05/01/23 1915  fluconazole (DIFLUCAN) tablet 300 mg        300 mg Oral Every M-W-F 05/01/23 1911     05/01/23 1500  ceFEPIme (MAXIPIME) 2 g in sodium chloride 0.9 % 100 mL IVPB  Status:  Discontinued        2 g 200  mL/hr over 30 Minutes Intravenous Every 8 hours 05/01/23 1251 05/02/23 1744   05/01/23 1200  acyclovir (ZOVIRAX) 655 mg in dextrose 5 % 100 mL IVPB  Status:  Discontinued        10 mg/kg  65.5 kg (Adjusted) 113.1 mL/hr over 60 Minutes Intravenous Every 8 hours 05/01/23 1106 05/02/23 1744   05/01/23 1030  cefTRIAXone (ROCEPHIN) 2 g in sodium chloride 0.9 % 100 mL IVPB        2 g 200 mL/hr over 30 Minutes Intravenous  Once 05/01/23 1015 05/01/23 1117   05/01/23 1030  vancomycin (VANCOREADY) IVPB 1750 mg/350 mL        1,750 mg 175 mL/hr over 120 Minutes Intravenous  Once 05/01/23 1015 05/01/23 1452              Family Communication/Anticipated D/C date and plan/Code Status   DVT prophylaxis: Place and maintain sequential compression device Start: 05/02/23 1407 SCDs Start: 05/01/23 1123     Code Status: Full Code  Family Communication: None Disposition Plan: Plan to discharge home in 2 days   Status is: Inpatient Remains inpatient appropriate because: Meningitis, headache       Subjective:   Interval events noted.  She still has a headache.  Neck stiffness has improved.  No other complaints.  Objective:    Vitals:   05/04/23 1616 05/04/23 2025 05/05/23 0500 05/05/23 0938  BP: 104/70 115/68 124/83 (!) 109/57  Pulse: 60 68 62 76  Resp: 16 17 16 18   Temp: 98.4 F (36.9 C) 98.2 F (36.8 C) 98 F (36.7 C) 98.4 F (36.9 C)  TempSrc: Oral Oral Oral Oral  SpO2: 98% 98% 97% 97%  Weight:      Height:       No data found.   Intake/Output Summary (Last 24 hours) at 05/05/2023 1607 Last data filed at 05/04/2023 1721 Gross per 24 hour  Intake 361.79 ml  Output --  Net 361.79 ml   Filed Weights   05/01/23 4098  Weight: 81.6 kg    Exam:  GEN: NAD SKIN: Warm and dry EYES: No pallor ENT: MMM, no neck stiffness CV: RRR PULM: CTA B ABD: soft, ND, NT, +BS CNS: AAO x 3, non focal EXT: No edema or tenderness    Data Reviewed:   I have personally  reviewed following labs and imaging studies:  Labs: Labs show the following:   Basic Metabolic Panel: Recent Labs  Lab 05/01/23 0610 05/01/23 0738 05/02/23 0407 05/03/23 0630  NA 138  --  141 144  K 3.4*  --  4.5 3.6  CL 108  --  118* 115*  CO2 22  --  17* 22  GLUCOSE 114*  --  126* 114*  BUN 6  --  8 9  CREATININE 0.90  --  0.72 0.78  CALCIUM 9.3  --  9.1 9.0  MG  --  1.7  --   --    GFR Estimated Creatinine Clearance: 88.9 mL/min (by C-G formula based on SCr of 0.78 mg/dL). Liver Function Tests: Recent Labs  Lab 05/01/23 0610  AST 42*  ALT 20  ALKPHOS 93  BILITOT 0.6  PROT 7.7  ALBUMIN 3.8   No results for input(s): "LIPASE", "AMYLASE" in the last 168 hours. No results for input(s): "AMMONIA" in the last 168 hours. Coagulation profile Recent Labs  Lab 05/01/23 0619  INR 1.1    CBC: Recent Labs  Lab 05/01/23 0610 05/02/23 0407 05/03/23 0630  WBC 5.7 8.2 8.0  NEUTROABS 3.8  --   --   HGB 12.9 11.7* 12.1  HCT 38.5 34.2* 36.7  MCV 88.7 87.7 90.2  PLT 219 208 214   Cardiac Enzymes: No results for input(s): "CKTOTAL", "CKMB", "CKMBINDEX", "TROPONINI" in the last 168 hours. BNP (last 3 results) No results for input(s): "PROBNP" in the last 8760 hours. CBG: No results for input(s): "GLUCAP" in the last 168 hours. D-Dimer: No results for input(s): "DDIMER" in the last 72 hours. Hgb A1c: No results for input(s): "HGBA1C" in the last 72 hours. Lipid Profile: No results for input(s): "CHOL", "HDL", "LDLCALC", "TRIG", "CHOLHDL", "LDLDIRECT" in the last 72 hours. Thyroid function studies: No results for input(s): "TSH", "T4TOTAL", "T3FREE", "THYROIDAB" in the last 72 hours.  Invalid input(s): "FREET3" Anemia work up: No results for input(s): "VITAMINB12", "FOLATE", "FERRITIN", "TIBC", "IRON", "RETICCTPCT" in the last 72 hours. Sepsis Labs: Recent Labs  Lab 05/01/23 0610 05/02/23 0407 05/03/23 0630  PROCALCITON <0.10  --   --   WBC 5.7 8.2 8.0   LATICACIDVEN 1.6  --   --     Microbiology Recent Results (from the past 240 hour(s))  Culture, blood (Routine x 2)     Status: None (Preliminary result)   Collection Time: 05/01/23  6:19 AM   Specimen: BLOOD  Result Value Ref Range Status   Specimen Description BLOOD BLOOD LEFT FOREARM  Final   Special Requests   Final    BOTTLES DRAWN AEROBIC AND ANAEROBIC Blood Culture adequate volume   Culture   Final    NO GROWTH 4 DAYS Performed at [facility], [address], [place], [place] [address]    Report Status PENDING  Incomplete  Culture, blood (Routine x 2)     Status: None (Preliminary result)   Collection Time: 05/01/23  6:24 AM   Specimen: BLOOD  Result Value Ref Range Status   Specimen Description BLOOD LEFT ANTECUBITAL  Final   Special Requests   Final    BOTTLES DRAWN AEROBIC AND ANAEROBIC Blood Culture adequate volume   Culture   Final    NO GROWTH 4 DAYS Performed at [facility], [address], [place], [place] [address]    Report Status PENDING  Incomplete  SARS Coronavirus 2 by RT PCR (hospital order, performed in [facility] hospital lab) *cepheid single result test* Anterior Nasal Swab     Status: None   Collection Time: 05/01/23  6:30 AM   Specimen: Anterior Nasal Swab  Result Value Ref Range Status   SARS Coronavirus 2  by RT PCR NEGATIVE NEGATIVE Final    Comment: (NOTE) SARS-CoV-2 target nucleic acids are NOT DETECTED.  The SARS-CoV-2 RNA is generally detectable in upper and lower respiratory specimens during the acute phase of infection. The lowest concentration of SARS-CoV-2 viral copies this assay can detect is 250 copies / mL. A negative result does not preclude SARS-CoV-2 infection and should not be used as the sole basis for treatment or other patient management decisions.  A negative result may occur with improper specimen collection / handling, submission of specimen other than nasopharyngeal swab, presence of viral  mutation(s) within the areas targeted by this assay, and inadequate number of viral copies (<250 copies / mL). A negative result must be combined with clinical observations, patient history, and epidemiological information.  Fact Sheet for Patients:   RoadLapTop.co.za  Fact Sheet for Healthcare Providers: http://kim-miller.com/  This test is not yet approved or  cleared by the [place] FDA and has been authorized for detection and/or diagnosis of SARS-CoV-2 by FDA under an Emergency Use Authorization (EUA).  This EUA will remain in effect (meaning this test can be used) for the duration of the COVID-19 declaration under Section 564(b)(1) of the Act, 21 U.S.C. section 360bbb-3(b)(1), unless the authorization is terminated or revoked sooner.  Performed at [facility], [address] [place], [place] [address]   Resp Panel by RT-PCR (Flu A&B, Covid)     Status: None   Collection Time: 05/01/23  6:35 AM  Result Value Ref Range Status   SARS Coronavirus 2 by RT PCR NEGATIVE NEGATIVE Final    Comment: (NOTE) SARS-CoV-2 target nucleic acids are NOT DETECTED.  The SARS-CoV-2 RNA is generally detectable in upper respiratory specimens during the acute phase of infection. The lowest concentration of SARS-CoV-2 viral copies this assay can detect is 138 copies/mL. A negative result does not preclude SARS-Cov-2 infection and should not be used as the sole basis for treatment or other patient management decisions. A negative result may occur with  improper specimen collection/handling, submission of specimen other than nasopharyngeal swab, presence of viral mutation(s) within the areas targeted by this assay, and inadequate number of viral copies(<138 copies/mL). A negative result must be combined with clinical observations, patient history, and epidemiological information. The expected result is Negative.  Fact Sheet for  Patients:  BloggerCourse.com  Fact Sheet for Healthcare Providers:  SeriousBroker.it  This test is no t yet approved or cleared by the [place] FDA and  has been authorized for detection and/or diagnosis of SARS-CoV-2 by FDA under an Emergency Use Authorization (EUA). This EUA will remain  in effect (meaning this test can be used) for the duration of the COVID-19 declaration under Section 564(b)(1) of the Act, 21 U.S.C.section 360bbb-3(b)(1), unless the authorization is terminated  or revoked sooner.       Influenza A by PCR NEGATIVE NEGATIVE Final   Influenza B by PCR NEGATIVE NEGATIVE Final    Comment: (NOTE) The Xpert Xpress SARS-CoV-2/FLU/RSV plus assay is intended as an aid in the diagnosis of influenza from Nasopharyngeal swab specimens and should not be used as a sole basis for treatment. Nasal washings and aspirates are unacceptable for Xpert Xpress SARS-CoV-2/FLU/RSV testing.  Fact Sheet for Patients: BloggerCourse.com  Fact Sheet for Healthcare Providers: SeriousBroker.it  This test is not yet approved or cleared by the [place] FDA and has been authorized for detection and/or diagnosis of SARS-CoV-2 by FDA under an Emergency Use Authorization (EUA). This EUA will remain in effect (  meaning this test can be used) for the duration of the COVID-19 declaration under Section 564(b)(1) of the Act, 21 U.S.C. section 360bbb-3(b)(1), unless the authorization is terminated or revoked.  Performed at [facility], [address] [place], [place] [address]   CSF culture w Gram Stain     Status: None   Collection Time: 05/01/23 10:20 AM   Specimen: CSF; Cerebrospinal Fluid  Result Value Ref Range Status   Specimen Description   Final    CSF Performed at [facility], [address] [place], [place] [address]    Special Requests   Final     NONE Performed at [facility], [address] [place], [place] [address]    Gram Stain   Final    WBC SEEN NO RBC SEEN NO ORGANISMS SEEN Performed at [facility], [address] [place], [place] [address]    Culture   Final    NO GROWTH 3 DAYS Performed at [facility], [address] [place], [place] [address]    Report Status 05/04/2023 FINAL  Final  Fungus Culture With Stain     Status: None (Preliminary result)   Collection Time: 05/01/23 10:20 AM   Specimen: CSF  Result Value Ref Range Status   Fungus Stain Final report  Final    Comment: (NOTE) Performed At: [facility] [address] [place], [place] [id number] Jolene Schimke MD ON:[id number]    Fungus (Mycology) Culture PENDING  Incomplete   Fungal Source CSF  Final    Comment: Performed at [facility], [address] [place], [place] [address]  Fungus Culture Result     Status: None   Collection Time: 05/01/23 10:20 AM  Result Value Ref Range Status   Result 1 Comment  Final    Comment: (NOTE) KOH/Calcofluor preparation:  no fungus observed. Performed At: [facility] [address] [place], [place] [id number] Jolene Schimke MD IH:[id number]   Respiratory (~20 pathogens) panel by PCR     Status: None   Collection Time: 05/01/23  2:09 PM   Specimen: Nasopharyngeal Swab; Respiratory  Result Value Ref Range Status   Adenovirus NOT DETECTED NOT DETECTED Final   Coronavirus 229E NOT DETECTED NOT DETECTED Final    Comment: (NOTE) The Coronavirus on the Respiratory Panel, DOES NOT test for the novel  Coronavirus (2019 nCoV)    Coronavirus HKU1 NOT DETECTED NOT DETECTED Final   Coronavirus NL63 NOT DETECTED NOT DETECTED Final   Coronavirus OC43 NOT DETECTED NOT DETECTED Final   Metapneumovirus NOT DETECTED NOT DETECTED Final   Rhinovirus / Enterovirus NOT DETECTED NOT DETECTED Final   Influenza A NOT DETECTED NOT DETECTED Final   Influenza B NOT DETECTED  NOT DETECTED Final   Parainfluenza Virus 1 NOT DETECTED NOT DETECTED Final   Parainfluenza Virus 2 NOT DETECTED NOT DETECTED Final   Parainfluenza Virus 3 NOT DETECTED NOT DETECTED Final   Parainfluenza Virus 4 NOT DETECTED NOT DETECTED Final   Respiratory Syncytial Virus NOT DETECTED NOT DETECTED Final   Bordetella pertussis NOT DETECTED NOT DETECTED Final   Bordetella Parapertussis NOT DETECTED NOT DETECTED Final   Chlamydophila pneumoniae NOT DETECTED NOT DETECTED Final   Mycoplasma pneumoniae NOT DETECTED NOT DETECTED Final    Comment: Performed at [facility], [address], [place], [place] [address]    Procedures and diagnostic studies:  MR BRAIN W WO CONTRAST  Result Date: 05/04/2023 CLINICAL DATA:  Headache.  History of  uterine sarcoma. EXAM: MRI HEAD WITHOUT AND WITH CONTRAST TECHNIQUE: Multiplanar, multiecho pulse sequences of the brain and surrounding structures were obtained without and with intravenous contrast. CONTRAST:  8mL GADAVIST GADOBUTROL 1 MMOL/ML IV SOLN COMPARISON:  None Available. FINDINGS: Brain: No acute infarct, mass effect or extra-axial collection. No acute or chronic hemorrhage. Normal white matter signal, parenchymal volume and CSF spaces. The midline structures are normal. There is no abnormal contrast enhancement. Vascular: Major flow voids are preserved. Skull and upper cervical spine: Normal calvarium and skull base. Visualized upper cervical spine and soft tissues are normal. Sinuses/Orbits:No paranasal sinus fluid levels or advanced mucosal thickening. No mastoid or middle ear effusion. Normal orbits. IMPRESSION: Normal brain MRI. Electronically Signed   By: Deatra Robinson M.D.   On: 05/04/2023 02:12               LOS: 4 days   Ambyr Qadri  [facility]   Pager on [email or web address]. If 7PM-7AM, please contact night-coverage at [email or web address]     05/05/2023, 4:07 PM

## 2023-05-06 DIAGNOSIS — G030 Nonpyogenic meningitis: Secondary | ICD-10-CM | POA: Diagnosis not present

## 2023-05-06 DIAGNOSIS — T50905A Adverse effect of unspecified drugs, medicaments and biological substances, initial encounter: Secondary | ICD-10-CM | POA: Diagnosis not present

## 2023-05-06 LAB — CULTURE, BLOOD (ROUTINE X 2)
Culture: NO GROWTH
Special Requests: ADEQUATE
Special Requests: ADEQUATE

## 2023-05-06 LAB — ANA COMPREHENSIVE PANEL
Anti JO-1: <0.2 AI (ref 0.0–0.9)
Centromere Ab Screen: <0.2 AI (ref 0.0–0.9)
Chromatin Ab SerPl-aCnc: <0.2 AI (ref 0.0–0.9)
ENA SM Ab Ser-aCnc: <0.2 AI (ref 0.0–0.9)
Ribonucleic Protein: 0.3 AI (ref 0.0–0.9)
SSA (Ro) (ENA) Antibody, IgG: 0.7 AI (ref 0.0–0.9)
SSB (La) (ENA) Antibody, IgG: <0.2 AI (ref 0.0–0.9)
Scleroderma (Scl-70) (ENA) Antibody, IgG: <0.2 AI (ref 0.0–0.9)
ds DNA Ab: 1 [IU]/mL (ref 0–9)

## 2023-05-06 MED ORDER — MELOXICAM 15 MG PO TABS: 15.0000 mg | ORAL_TABLET | ORAL | Status: DC | PRN

## 2023-05-06 MED ORDER — ENOXAPARIN SODIUM 40 MG/0.4ML IJ SOSY: 40.0000 mg | PREFILLED_SYRINGE | INTRAMUSCULAR | Status: DC

## 2023-05-06 NOTE — Plan of Care (Signed)

## 2023-05-06 NOTE — Discharge Summary (Addendum)
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 *  Hospital Course:  [name] Horn is a 47 y.o. 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 difficile colitis, rheumatoid arthritis, herpes labialis, gastroparesis, left leg DVT 2011 not on anticoagulants.  She presented to the hospital because of headache, fever and chills.    She was admitted to the hospital for sepsis secondary to acute aseptic meningitis.    Assessment and Plan:   Sepsis secondary to acute aseptic meningitis: Meningitis/encephalitis panel was negative.  She was initially treated with empiric IV antibiotics and IV acyclovir.  Antimicrobial therapy was discontinued after ME panel came back negative.  IV immunoglobulin suspected as the cause of meningitis.  ID has signed off.     Antibody deficiency syndrome: She receives IV immunoglobulin every month.  Last dose was on 04/28/2023.  She had received 3 doses previously.     Refractory headache: MRI brain with and without contrast was unremarkable.  Patient also has history of migraine headache.  CT venogram was unremarkable.  Paraneoplastic antibodies are pending.   Plan of care was discussed with Dr. Selina Cooley, neurologist.  She said patient is okay for discharge from her standpoint.  She has nothing to add at this point.  Paraneoplastic antibodies can be followed up by her PCP in the outpatient setting.      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 difficile colitis.     Her condition has improved and she is deemed stable for discharge to home today. Of note, patient had requested prescription for Percocet.  PDMP website reviewed showed that patient had 150 tablets of Percocet filled on 04/12/2023.  Patient was informed that additional tablets of Percocet cannot be prescribed at this time.      Consultants: ID specialist, neurologist Procedures performed: Lumbar puncture Disposition: Home Diet recommendation:  Discharge Diet Orders (From admission, onward)     Start     Ordered   05/06/23 0000  Diet - low sodium heart healthy        05/06/23 1518           Cardiac diet DISCHARGE MEDICATION: Allergies as of 05/06/2023       Reactions   Cyanoacrylate Hives   Other Itching, Rash   Dermabond surgical glue. Dermabond surgical glue-blisters   Sulfa Antibiotics Anaphylaxis, Rash, Shortness Of Breath, Swelling   Angioedema (also)   Sulfonamide Derivatives Hives, Shortness Of Breath, Swelling   TONGUE SWELLS  Benadryl [diphenhydramine Hcl] Other (See Comments)   Hyperactivity   Sulfamethoxazole    Other Reaction(s): Angioedema   Diphenhydramine    Diphenhydramine Hcl    Other Reaction(s): Other (See Comments) HYPERACTIVITY, RESTLESS LEG   Silicone Rash   Watch band        Medication List     STOP taking these medications    oxyCODONE 5 MG immediate  release tablet Commonly known as: Roxicodone   sucralfate 1 g tablet Commonly known as: Carafate   traZODone 150 MG tablet Commonly known as: DESYREL       TAKE these medications    albuterol (2.5 MG/3ML) 0.083% nebulizer solution Commonly known as: PROVENTIL Take 3 mLs (2.5 mg total) by nebulization every 4 (four) hours as needed for wheezing or shortness of breath.   albuterol 108 (90 Base) MCG/ACT inhaler Commonly known as: VENTOLIN HFA Inhale 1 puff into the lungs every 4 (four) hours as needed.   alosetron 0.5 MG tablet Commonly known as: LOTRONEX Take 0.5 mg by mouth as needed (rash).   amphetamine-dextroamphetamine 20 MG tablet Commonly known as: ADDERALL Take 20 mg by mouth 3 (three) times daily.   atropine 1 % ophthalmic solution Place 1 drop into the left eye daily as needed (for dry eye).   baclofen 10 MG tablet Commonly known as: LIORESAL Take 10 mg by mouth at bedtime.   Bepotastine Besilate 1.5 % Soln Place 1 drop into both eyes 2 (two) times a day.   budesonide-formoterol 80-4.5 MCG/ACT inhaler Commonly known as: Symbicort Inhale 2 puffs into the lungs 2 (two) times daily. Can also take Q4 hr as needed for wheezing   cyclobenzaprine 10 MG tablet Commonly known as: FLEXERIL Take 1 tablet (10 mg total) by mouth at bedtime.   cycloSPORINE 0.05 % ophthalmic emulsion Commonly known as: RESTASIS Place 1 drop into both eyes 2 (two) times daily.   dicyclomine 10 MG capsule Commonly known as: BENTYL Take 10 mg by mouth daily as needed for nausea/vomiting.   Dupilumab 300 MG/2ML Sopn Inject 300 mg into the skin once a week. Friday   famotidine 40 MG tablet Commonly known as: PEPCID Take 40 mg by mouth at bedtime.   fluconazole 150 MG tablet Commonly known as: DIFLUCAN Take 300 mg by mouth every Monday, Wednesday, and Friday.   folic acid 1 MG tablet Commonly known as: FOLVITE Take 2 tablets (2 mg total) by mouth daily.   furosemide 20 MG  tablet Commonly known as: LASIX Take 60 mg by mouth 2 (two) times daily.   LORazepam 1 MG tablet Commonly known as: ATIVAN Take 1 mg by mouth 3 (three) times daily as needed for anxiety.   magic mouthwash (nystatin, lidocaine, diphenhydrAMINE, alum & mag hydroxide) suspension Swish and spit 5 mLs 3 (three) times daily as needed for mouth pain.   meloxicam 15 MG tablet Commonly known as: Mobic Take 1 tablet (15 mg total) by mouth as needed for pain.   metoCLOPramide 10 MG tablet Commonly known as: REGLAN Take 10 mg by mouth 2 (two) times daily.   metoprolol succinate 25 MG 24 hr tablet Commonly known as: TOPROL-XL Take 50 mg by mouth daily.   nystatin 100000 UNIT/ML suspension Commonly known as: MYCOSTATIN Take 10 mLs (1,000,000 Units total) by mouth 3 (three) times daily.   nystatin powder Generic drug: nystatin Apply 1 application topically 2 (two) times daily as needed (rash).   ondansetron 4 MG disintegrating tablet Commonly known as: ZOFRAN-ODT Take 1  tablet (4 mg total) by mouth every 8 (eight) hours as needed for nausea or vomiting.   oxyCODONE-acetaminophen 10-325 MG tablet Commonly known as: PERCOCET Take 1 tablet by mouth every 4 (four) hours as needed for pain.   pantoprazole 40 MG tablet Commonly known as: Protonix Take 1 tablet (40 mg total) by mouth 2 (two) times daily.   Polyethyl Glycol-Propyl Glycol 0.4-0.3 % Gel ophthalmic gel Commonly known as: SYSTANE Place 1 application into both eyes in the morning and at bedtime.   Potassium Chloride ER 20 MEQ Tbcr Take 20 mEq by mouth daily as needed (when taking fursoemide).   pravastatin 40 MG tablet Commonly known as: PRAVACHOL Take 40 mg by mouth at bedtime.   prednisoLONE acetate 1 % ophthalmic suspension Commonly known as: PRED FORTE Place 1 drop into the left eye as needed (conjunctivitis).   promethazine 25 MG tablet Commonly known as: PHENERGAN Take 1 tablet (25 mg total) by mouth every 6  (six) hours as needed for nausea or vomiting.   SUMAtriptan 25 MG tablet Commonly known as: IMITREX Take 25 mg by mouth every 2 (two) hours as needed for migraine. May repeat in 2 hours if headache persists or recurs.   topiramate 25 MG tablet Commonly known as: TOPAMAX Take 25 mg by mouth at bedtime.   topiramate 100 MG tablet Commonly known as: TOPAMAX Take 100 mg by mouth daily.   triamcinolone lotion 0.1 % Commonly known as: KENALOG Apply 1 application topically daily as needed (rash).   valACYclovir 1000 MG tablet Commonly known as: VALTREX Take 1 tablet (1,000 mg total) by mouth daily.        Discharge Exam: Filed Weights   05/01/23 4098  Weight: 81.6 kg   GEN: NAD SKIN: No rash EYES: EOMI ENT: MMM CV: RRR PULM: CTA B ABD: soft, ND, NT, +BS CNS: AAO x 3, non focal EXT: No edema or tenderness   Condition at discharge: good  The results of significant diagnostics from this hospitalization (including imaging, microbiology, ancillary and laboratory) are listed below for reference.   Imaging Studies: CT VENOGRAM HEAD  Result Date: 05/05/2023 CLINICAL DATA:  Dural venous sinus thrombosis suspected. 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 EXAM: CT VENOGRAM HEAD TECHNIQUE: Venographic phase images of the brain were obtained following the administration of intravenous contrast. Multiplanar reformats and maximum intensity projections were generated. RADIATION DOSE REDUCTION: This exam was performed according to the departmental dose-optimization program which includes automated exposure control, adjustment of the mA and/or kV according to patient size and/or use of iterative reconstruction technique. CONTRAST:  75mL OMNIPAQUE IOHEXOL 350 MG/ML SOLN COMPARISON:  None Available. FINDINGS: Brain: There is no mass, hemorrhage or extra-axial collection. The size and configuration of the ventricles and extra-axial CSF spaces are normal. The brain parenchyma is normal, without acute or  chronic infarction. Vascular: No abnormal hyperdensity of the major intracranial arteries or dural venous sinuses. No intracranial atherosclerosis. Postcontrast imaging confirms patency of the dural venous sinuses and major cerebral veins. Skull: The visualized skull base, calvarium and extracranial soft tissues are normal. Sinuses/Orbits: No fluid levels or advanced mucosal thickening of the visualized paranasal sinuses. No mastoid or middle ear effusion. The orbits are normal. IMPRESSION: Normal head CT. No dural venous sinus thrombosis. Electronically Signed   By: Deatra Robinson M.D.   On: 05/05/2023 18:55   MR BRAIN W WO CONTRAST  Result Date: 05/04/2023 CLINICAL DATA:  Headache.  History of uterine sarcoma. EXAM: MRI HEAD WITHOUT AND WITH CONTRAST  TECHNIQUE: Multiplanar, multiecho pulse sequences of the brain and surrounding structures were obtained without and with intravenous contrast. CONTRAST:  8mL GADAVIST GADOBUTROL 1 MMOL/ML IV SOLN COMPARISON:  None Available. FINDINGS: Brain: No acute infarct, mass effect or extra-axial collection. No acute or chronic hemorrhage. Normal white matter signal, parenchymal volume and CSF spaces. The midline structures are normal. There is no abnormal contrast enhancement. Vascular: Major flow voids are preserved. Skull and upper cervical spine: Normal calvarium and skull base. Visualized upper cervical spine and soft tissues are normal. Sinuses/Orbits:No paranasal sinus fluid levels or advanced mucosal thickening. No mastoid or middle ear effusion. Normal orbits. IMPRESSION: Normal brain MRI. Electronically Signed   By: Deatra Robinson M.D.   On: 05/04/2023 02:12   CT Angio Chest PE W and/or Wo Contrast  Result Date: 05/01/2023 CLINICAL DATA:  Headache and fever since yesterday. EXAM: CT ANGIOGRAPHY CHEST WITH CONTRAST TECHNIQUE: Multidetector CT imaging of the chest was performed using the standard protocol during bolus administration of intravenous contrast.  Multiplanar CT image reconstructions and MIPs were obtained to evaluate the vascular anatomy. RADIATION DOSE REDUCTION: This exam was performed according to the departmental dose-optimization program which includes automated exposure control, adjustment of the mA and/or kV according to patient size and/or use of iterative reconstruction technique. CONTRAST:  75mL OMNIPAQUE IOHEXOL 350 MG/ML SOLN COMPARISON:  Chest radiograph performed earlier on the same date and CT examination dated December 09, 2022 FINDINGS: Cardiovascular: Satisfactory opacification of the pulmonary arteries to the segmental level. No evidence of pulmonary embolism. Normal heart size. No pericardial effusion. Mediastinum/Nodes: No enlarged mediastinal, hilar, or axillary lymph nodes. Thyroid gland, trachea, and esophagus demonstrate no significant findings. Lungs/Pleura: Lungs are clear. No pleural effusion or pneumothorax. Upper Abdomen: No acute abnormality. Musculoskeletal: No chest wall abnormality. No acute or significant osseous findings. Review of the MIP images confirms the above findings. IMPRESSION: No evidence of pulmonary embolism or acute intrathoracic process. Electronically Signed   By: Larose Hires D.O.   On: 05/01/2023 08:29   CT HEAD WO CONTRAST ( )  Result Date: 05/01/2023 CLINICAL DATA:  Provided history: Headache, fever. EXAM: CT HEAD WITHOUT CONTRAST TECHNIQUE: Contiguous axial images were obtained from the base of the skull through the vertex without intravenous contrast. RADIATION DOSE REDUCTION: This exam was performed according to the departmental dose-optimization program which includes automated exposure control, adjustment of the mA and/or kV according to patient size and/or use of iterative reconstruction technique. COMPARISON:  CT examinations 10/11/2022 and earlier. FINDINGS: Brain: Cerebral volume is normal. Redemonstrated small chronic hypodensity within the left parietal white matter, favored to reflect a  prominent perivascular space. There is no acute intracranial hemorrhage. No demarcated cortical infarct. No extra-axial fluid collection. No evidence of an intracranial mass. No midline shift. Vascular: No hyperdense vessel. Atherosclerotic calcifications. Skull: No fracture or aggressive osseous lesion. Sinuses/Orbits: No mass or acute finding within the imaged orbits. Minimal mucosal thickening within the inferior left frontal sinus and within a right ethmoid air cell. Otherwise, no significant phlegm a tori paranasal sinus disease at the imaged levels. IMPRESSION: 1. No evidence of an acute intracranial abnormality. 2. Minimal mucosal thickening within the left frontal and right ethmoid sinuses. Electronically Signed   By: Jackey Loge D.O.   On: 05/01/2023 08:20   DG Chest Port 1 View  Result Date: 05/01/2023 CLINICAL DATA:  [id number] with sepsis, headache and fever. EXAM: PORTABLE CHEST 1 VIEW COMPARISON:  PA Lat 12/08/2022, CTA chest 12/09/2022 FINDINGS: The heart size and mediastinal  contours are within normal limits. A loop recorder device is implanted in the left chest wall as before. Both lungs are clear with slightly elevated right hemidiaphragm. The visualized skeletal structures are unremarkable. IMPRESSION: No active disease.  Stable chest. Electronically Signed   By: Almira Bar M.D.   On: 05/01/2023 07:32    Microbiology: Results for orders placed or performed during the hospital encounter of 05/01/23  Culture, blood (Routine x 2)     Status: None   Collection Time: 05/01/23  6:19 AM   Specimen: BLOOD  Result Value Ref Range Status   Specimen Description BLOOD BLOOD LEFT FOREARM  Final   Special Requests   Final    BOTTLES DRAWN AEROBIC AND ANAEROBIC Blood Culture adequate volume   Culture   Final    NO GROWTH 5 DAYS Performed at [facility], [address] [place], [place] [address]    Report Status 05/06/2023 FINAL  Final  Culture, blood (Routine x 2)     Status:  None   Collection Time: 05/01/23  6:24 AM   Specimen: BLOOD  Result Value Ref Range Status   Specimen Description BLOOD LEFT ANTECUBITAL  Final   Special Requests   Final    BOTTLES DRAWN AEROBIC AND ANAEROBIC Blood Culture adequate volume   Culture   Final    NO GROWTH 5 DAYS Performed at [facility], [address] [place], [place] [address]    Report Status 05/06/2023 FINAL  Final  SARS Coronavirus 2 by RT PCR (hospital order, performed in [facility] hospital lab) *cepheid single result test* Anterior Nasal Swab     Status: None   Collection Time: 05/01/23  6:30 AM   Specimen: Anterior Nasal Swab  Result Value Ref Range Status   SARS Coronavirus 2 by RT PCR NEGATIVE NEGATIVE Final    Comment: (NOTE) SARS-CoV-2 target nucleic acids are NOT DETECTED.  The SARS-CoV-2 RNA is generally detectable in upper and lower respiratory specimens during the acute phase of infection. The lowest concentration of SARS-CoV-2 viral copies this assay can detect is 250 copies / mL. A negative result does not preclude SARS-CoV-2 infection and should not be used as the sole basis for treatment or other patient management decisions.  A negative result may occur with improper specimen collection / handling, submission of specimen other than nasopharyngeal swab, presence of viral mutation(s) within the areas targeted by this assay, and inadequate number of viral copies (<250 copies / mL). A negative result must be combined with clinical observations, patient history, and epidemiological information.  Fact Sheet for Patients:   RoadLapTop.co.za  Fact Sheet for Healthcare Providers: http://kim-miller.com/  This test is not yet approved or  cleared by the [place] FDA and has been authorized for detection and/or diagnosis of SARS-CoV-2 by FDA under an Emergency Use Authorization (EUA).  This EUA will remain in effect (meaning this test can  be used) for the duration of the COVID-19 declaration under Section 564(b)(1) of the Act, 21 U.S.C. section 360bbb-3(b)(1), unless the authorization is terminated or revoked sooner.  Performed at [facility], [address] [place], [place] [address]   Resp Panel by RT-PCR (Flu A&B, Covid)     Status: None   Collection Time: 05/01/23  6:35 AM  Result Value Ref Range Status   SARS Coronavirus 2 by RT PCR NEGATIVE NEGATIVE Final    Comment: (NOTE) SARS-CoV-2 target nucleic acids are NOT DETECTED.  The SARS-CoV-2 RNA is generally detectable in  upper respiratory specimens during the acute phase of infection. The lowest concentration of SARS-CoV-2 viral copies this assay can detect is 138 copies/mL. A negative result does not preclude SARS-Cov-2 infection and should not be used as the sole basis for treatment or other patient management decisions. A negative result may occur with  improper specimen collection/handling, submission of specimen other than nasopharyngeal swab, presence of viral mutation(s) within the areas targeted by this assay, and inadequate number of viral copies(<138 copies/mL). A negative result must be combined with clinical observations, patient history, and epidemiological information. The expected result is Negative.  Fact Sheet for Patients:  BloggerCourse.com  Fact Sheet for Healthcare Providers:  SeriousBroker.it  This test is no t yet approved or cleared by the [place] FDA and  has been authorized for detection and/or diagnosis of SARS-CoV-2 by FDA under an Emergency Use Authorization (EUA). This EUA will remain  in effect (meaning this test can be used) for the duration of the COVID-19 declaration under Section 564(b)(1) of the Act, 21 U.S.C.section 360bbb-3(b)(1), unless the authorization is terminated  or revoked sooner.       Influenza A by PCR NEGATIVE NEGATIVE Final   Influenza  B by PCR NEGATIVE NEGATIVE Final    Comment: (NOTE) The Xpert Xpress SARS-CoV-2/FLU/RSV plus assay is intended as an aid in the diagnosis of influenza from Nasopharyngeal swab specimens and should not be used as a sole basis for treatment. Nasal washings and aspirates are unacceptable for Xpert Xpress SARS-CoV-2/FLU/RSV testing.  Fact Sheet for Patients: BloggerCourse.com  Fact Sheet for Healthcare Providers: SeriousBroker.it  This test is not yet approved or cleared by the [place] FDA and has been authorized for detection and/or diagnosis of SARS-CoV-2 by FDA under an Emergency Use Authorization (EUA). This EUA will remain in effect (meaning this test can be used) for the duration of the COVID-19 declaration under Section 564(b)(1) of the Act, 21 U.S.C. section 360bbb-3(b)(1), unless the authorization is terminated or revoked.  Performed at [facility], [address] [place], [place] [address]   CSF culture w Gram Stain     Status: None   Collection Time: 05/01/23 10:20 AM   Specimen: CSF; Cerebrospinal Fluid  Result Value Ref Range Status   Specimen Description   Final    CSF Performed at [facility], [address] [place], [place] [address]    Special Requests   Final    NONE Performed at [facility], [address] [place], [place] [address]    Gram Stain   Final    WBC SEEN NO RBC SEEN NO ORGANISMS SEEN Performed at [facility], [address] [place], [place] [address]    Culture   Final    NO GROWTH 3 DAYS Performed at [facility], [address], [place], [place] [address]    Report Status 05/04/2023 FINAL  Final  Fungus Culture With Stain     Status: None (Preliminary result)   Collection Time: 05/01/23 10:20 AM   Specimen: CSF  Result Value Ref Range Status   Fungus Stain Final report  Final    Comment: (NOTE) Performed At: [facility] [address] [place], [place] [id number] Jolene Schimke MD ON:[id number]    Fungus (Mycology) Culture PENDING  Incomplete   Fungal Source CSF  Final    Comment: Performed at [facility], [address] [place], [place] [address]  Fungus Culture Result     Status: None  Collection Time: 05/01/23 10:20 AM  Result Value Ref Range Status   Result 1 Comment  Final    Comment: (NOTE) KOH/Calcofluor preparation:  no fungus observed. Performed At: [facility] [address] [place], [place] [id number] Jolene Schimke MD JX:[id number]   Respiratory (~20 pathogens) panel by PCR     Status: None   Collection Time: 05/01/23  2:09 PM   Specimen: Nasopharyngeal Swab; Respiratory  Result Value Ref Range Status   Adenovirus NOT DETECTED NOT DETECTED Final   Coronavirus 229E NOT DETECTED NOT DETECTED Final    Comment: (NOTE) The Coronavirus on the Respiratory Panel, DOES NOT test for the novel  Coronavirus (2019 nCoV)    Coronavirus HKU1 NOT DETECTED NOT DETECTED Final   Coronavirus NL63 NOT DETECTED NOT DETECTED Final   Coronavirus OC43 NOT DETECTED NOT DETECTED Final   Metapneumovirus NOT DETECTED NOT DETECTED Final   Rhinovirus / Enterovirus NOT DETECTED NOT DETECTED Final   Influenza A NOT DETECTED NOT DETECTED Final   Influenza B NOT DETECTED NOT DETECTED Final   Parainfluenza Virus 1 NOT DETECTED NOT DETECTED Final   Parainfluenza Virus 2 NOT DETECTED NOT DETECTED Final   Parainfluenza Virus 3 NOT DETECTED NOT DETECTED Final   Parainfluenza Virus 4 NOT DETECTED NOT DETECTED Final   Respiratory Syncytial Virus NOT DETECTED NOT DETECTED Final   Bordetella pertussis NOT DETECTED NOT DETECTED Final   Bordetella Parapertussis NOT DETECTED NOT DETECTED Final   Chlamydophila pneumoniae NOT DETECTED NOT DETECTED Final   Mycoplasma pneumoniae NOT DETECTED NOT DETECTED Final    Comment: Performed at [facility], [address], [place], [place] [address]     Labs: CBC: Recent Labs  Lab 05/01/23 0610 05/02/23 0407 05/03/23 0630  WBC 5.7 8.2 8.0  NEUTROABS 3.8  --   --   HGB 12.9 11.7* 12.1  HCT 38.5 34.2* 36.7  MCV 88.7 87.7 90.2  PLT 219 208 214   Basic Metabolic Panel: Recent Labs  Lab 05/01/23 0610 05/01/23 0738 05/02/23 0407 05/03/23 0630  NA 138  --  141 144  K 3.4*  --  4.5 3.6  CL 108  --  118* 115*  CO2 22  --  17* 22  GLUCOSE 114*  --  126* 114*  BUN 6  --  8 9  CREATININE 0.90  --  0.72 0.78  CALCIUM 9.3  --  9.1 9.0  MG  --  1.7  --   --    Liver Function Tests: Recent Labs  Lab 05/01/23 0610  AST 42*  ALT 20  ALKPHOS 93  BILITOT 0.6  PROT 7.7  ALBUMIN 3.8   CBG: No results for input(s): "GLUCAP" in the last 168 hours.  Discharge time spent: greater than 30 minutes.  Signed: Lurene Shadow, MD [facility] 05/06/2023

## 2023-05-08 MED FILL — DUPIXENT 300 MG/2 ML SUBCUTANEOUS SYRINGE: SUBCUTANEOUS | 28 days supply | Qty: 8 | Fill #3

## 2023-05-09 LAB — PARANEOPLASTIC AB
AGNA-1: NEGATIVE
Amphiphysin Antibody: NEGATIVE
Anti-Hu Ab: NEGATIVE
Anti-Ri Ab: NEGATIVE
Anti-Yo Ab: NEGATIVE
Antineruonal nuclear Ab Type 3: NEGATIVE
CASPR2 Antibody,Cell-based IFA: NEGATIVE
CRMP-5 IgG: NEGATIVE
Interpretation: NEGATIVE
LGI1 Antibody, Cell-based IFA: NEGATIVE
Purkinje Cell Cyto Ab Type 2: NEGATIVE
Purkinje Cell Cyto Ab Type Tr: NEGATIVE
VGCC Antibody: <1 pmol/L (ref 0.0–30.0)

## 2023-05-09 MED ORDER — DUPIXENT 300 MG/2 ML SUBCUTANEOUS SYRINGE
SUBCUTANEOUS | 6 refills | 28 days
Start: 2023-05-09 — End: ?

## 2023-05-30 LAB — FUNGUS CULTURE WITH STAIN

## 2023-05-30 LAB — FUNGAL ORGANISM REFLEX

## 2023-06-06 ENCOUNTER — Inpatient Hospital Stay
Admission: EM | Admit: 2023-06-06 | Discharge: 2023-06-09 | DRG: 098 | Disposition: A | Discharge status: Home / Self Care | Payer: BC Managed Care – PPO | Attending: Internal Medicine | Admitting: Internal Medicine

## 2023-06-06 ENCOUNTER — Emergency Department: Payer: BC Managed Care – PPO

## 2023-06-06 DIAGNOSIS — A689 Relapsing fever, unspecified: Secondary | ICD-10-CM | POA: Diagnosis present

## 2023-06-06 DIAGNOSIS — G2581 Restless legs syndrome: Secondary | ICD-10-CM | POA: Diagnosis present

## 2023-06-06 DIAGNOSIS — B009 Herpesviral infection, unspecified: Secondary | ICD-10-CM | POA: Diagnosis present

## 2023-06-06 DIAGNOSIS — Z8616 Personal history of COVID-19: Secondary | ICD-10-CM

## 2023-06-06 DIAGNOSIS — G030 Nonpyogenic meningitis: Secondary | ICD-10-CM | POA: Diagnosis not present

## 2023-06-06 DIAGNOSIS — Z800 Family history of malignant neoplasm of digestive organs: Secondary | ICD-10-CM

## 2023-06-06 DIAGNOSIS — Z9109 Other allergy status, other than to drugs and biological substances: Secondary | ICD-10-CM

## 2023-06-06 DIAGNOSIS — Z888 Allergy status to other drugs, medicaments and biological substances status: Secondary | ICD-10-CM

## 2023-06-06 DIAGNOSIS — D809 Immunodeficiency with predominantly antibody defects, unspecified: Secondary | ICD-10-CM | POA: Diagnosis present

## 2023-06-06 DIAGNOSIS — Z882 Allergy status to sulfonamides status: Secondary | ICD-10-CM

## 2023-06-06 DIAGNOSIS — J45909 Unspecified asthma, uncomplicated: Secondary | ICD-10-CM | POA: Diagnosis present

## 2023-06-06 DIAGNOSIS — M069 Rheumatoid arthritis, unspecified: Secondary | ICD-10-CM | POA: Diagnosis present

## 2023-06-06 DIAGNOSIS — M5412 Radiculopathy, cervical region: Secondary | ICD-10-CM | POA: Diagnosis present

## 2023-06-06 DIAGNOSIS — D806 Antibody deficiency with near-normal immunoglobulins or with hyperimmunoglobulinemia: Secondary | ICD-10-CM | POA: Diagnosis present

## 2023-06-06 DIAGNOSIS — E785 Hyperlipidemia, unspecified: Secondary | ICD-10-CM | POA: Diagnosis present

## 2023-06-06 DIAGNOSIS — Z90710 Acquired absence of both cervix and uterus: Secondary | ICD-10-CM

## 2023-06-06 DIAGNOSIS — Z86718 Personal history of other venous thrombosis and embolism: Secondary | ICD-10-CM

## 2023-06-06 DIAGNOSIS — Z8249 Family history of ischemic heart disease and other diseases of the circulatory system: Secondary | ICD-10-CM

## 2023-06-06 DIAGNOSIS — Z808 Family history of malignant neoplasm of other organs or systems: Secondary | ICD-10-CM

## 2023-06-06 DIAGNOSIS — K200 Eosinophilic esophagitis: Secondary | ICD-10-CM | POA: Diagnosis present

## 2023-06-06 DIAGNOSIS — Z79620 Long term (current) use of immunosuppressive biologic: Secondary | ICD-10-CM

## 2023-06-06 DIAGNOSIS — Z923 Personal history of irradiation: Secondary | ICD-10-CM

## 2023-06-06 DIAGNOSIS — R519 Headache, unspecified: Secondary | ICD-10-CM | POA: Diagnosis present

## 2023-06-06 DIAGNOSIS — Z83719 Family history of colon polyps, unspecified: Secondary | ICD-10-CM

## 2023-06-06 DIAGNOSIS — Z79899 Other long term (current) drug therapy: Secondary | ICD-10-CM

## 2023-06-06 DIAGNOSIS — Z9049 Acquired absence of other specified parts of digestive tract: Secondary | ICD-10-CM

## 2023-06-06 DIAGNOSIS — Z8542 Personal history of malignant neoplasm of other parts of uterus: Secondary | ICD-10-CM

## 2023-06-06 DIAGNOSIS — G43909 Migraine, unspecified, not intractable, without status migrainosus: Secondary | ICD-10-CM | POA: Diagnosis present

## 2023-06-06 DIAGNOSIS — Z7951 Long term (current) use of inhaled steroids: Secondary | ICD-10-CM

## 2023-06-06 DIAGNOSIS — E876 Hypokalemia: Secondary | ICD-10-CM | POA: Diagnosis present

## 2023-06-06 DIAGNOSIS — R509 Fever, unspecified: Secondary | ICD-10-CM | POA: Diagnosis present

## 2023-06-06 DIAGNOSIS — E538 Deficiency of other specified B group vitamins: Secondary | ICD-10-CM | POA: Diagnosis present

## 2023-06-06 LAB — URINE DRUG SCREEN, QUALITATIVE (ARMC ONLY)
Amphetamines, Ur Screen: POSITIVE — AB
Barbiturates, Ur Screen: NOT DETECTED
Benzodiazepine, Ur Scrn: NOT DETECTED
Cannabinoid 50 Ng, Ur ~~LOC~~: NOT DETECTED
Cocaine Metabolite,Ur ~~LOC~~: NOT DETECTED
MDMA (Ecstasy)Ur Screen: NOT DETECTED
Methadone Scn, Ur: NOT DETECTED
Opiate, Ur Screen: POSITIVE — AB
Phencyclidine (PCP) Ur S: NOT DETECTED
Tricyclic, Ur Screen: POSITIVE — AB

## 2023-06-06 LAB — COMPREHENSIVE METABOLIC PANEL
ALT: 17 U/L (ref 0–44)
AST: 31 U/L (ref 15–41)
Albumin: 4.2 g/dL (ref 3.5–5.0)
Alkaline Phosphatase: 93 U/L (ref 38–126)
Anion gap: 10 (ref 5–15)
BUN: 6 mg/dL (ref 6–20)
CO2: 20 mmol/L — ABNORMAL LOW (ref 22–32)
Calcium: 9.6 mg/dL (ref 8.9–10.3)
Chloride: 107 mmol/L (ref 98–111)
Creatinine, Ser: 0.9 mg/dL (ref 0.44–1.00)
GFR, Estimated: >60 mL/min (ref >60)
Glucose, Bld: 115 mg/dL — ABNORMAL HIGH (ref 70–99)
Potassium: 4 mmol/L (ref 3.5–5.1)
Sodium: 137 mmol/L (ref 135–145)
Total Bilirubin: 0.6 mg/dL (ref 0.3–1.2)
Total Protein: 7.6 g/dL (ref 6.5–8.1)

## 2023-06-06 LAB — CBC
HCT: 42.5 % (ref 36.0–46.0)
Hemoglobin: 14.3 g/dL (ref 12.0–15.0)
MCH: 30.5 pg (ref 26.0–34.0)
MCHC: 33.6 g/dL (ref 30.0–36.0)
MCV: 90.6 fL (ref 80.0–100.0)
Platelets: 283 10*3/uL (ref 150–400)
RBC: 4.69 MIL/uL (ref 3.87–5.11)
RDW: 14 % (ref 11.5–15.5)
WBC: 10.5 10*3/uL (ref 4.0–10.5)
nRBC: 0 % (ref 0.0–0.2)

## 2023-06-06 LAB — PROCALCITONIN: Procalcitonin: <0.1 ng/mL

## 2023-06-06 LAB — D-DIMER, QUANTITATIVE: D-Dimer, Quant: 1.05 ug/mL-FEU — ABNORMAL HIGH (ref 0.00–0.50)

## 2023-06-06 LAB — T4, FREE: Free T4: 0.69 ng/dL (ref 0.61–1.12)

## 2023-06-06 LAB — SEDIMENTATION RATE: Sed Rate: 27 mm/hr — ABNORMAL HIGH (ref 0–20)

## 2023-06-06 LAB — LACTIC ACID, PLASMA: Lactic Acid, Venous: 1.2 mmol/L (ref 0.5–1.9)

## 2023-06-06 LAB — HEMOGLOBIN A1C
Hgb A1c MFr Bld: 5.7 % — ABNORMAL HIGH (ref 4.8–5.6)
Mean Plasma Glucose: 116.89 mg/dL

## 2023-06-06 LAB — FERRITIN: Ferritin: 53 ng/mL (ref 11–307)

## 2023-06-06 LAB — TSH: TSH: 0.625 u[IU]/mL (ref 0.350–4.500)

## 2023-06-06 LAB — C-REACTIVE PROTEIN: CRP: 0.5 mg/dL (ref <1.0)

## 2023-06-06 MED ORDER — ACETAMINOPHEN 325 MG PO TABS
650.0000 mg | ORAL_TABLET | Freq: Four times a day (QID) | ORAL | Status: DC | PRN
  Administered 2023-06-07 (×2): 650 mg via ORAL
  Filled 2023-06-06 (×2): qty 2

## 2023-06-06 MED ORDER — PROCHLORPERAZINE EDISYLATE 10 MG/2ML IJ SOLN
10.0000 mg | Freq: Once | INTRAMUSCULAR | Status: AC
  Administered 2023-06-06: 10 mg via INTRAVENOUS
  Filled 2023-06-06: qty 2

## 2023-06-06 MED ORDER — SODIUM CHLORIDE 0.9% FLUSH
3.0000 mL | Freq: Two times a day (BID) | INTRAVENOUS | Status: DC
  Administered 2023-06-06 – 2023-06-09 (×5): 3 mL via INTRAVENOUS

## 2023-06-06 MED ORDER — MORPHINE SULFATE (PF) 4 MG/ML IV SOLN
4.0000 mg | Freq: Once | INTRAVENOUS | Status: AC
  Administered 2023-06-06: 4 mg via INTRAVENOUS
  Filled 2023-06-06: qty 1

## 2023-06-06 MED ORDER — LACTATED RINGERS IV SOLN: INTRAVENOUS | Status: DC

## 2023-06-06 MED ORDER — PANTOPRAZOLE SODIUM 40 MG IV SOLR
40.0000 mg | Freq: Two times a day (BID) | INTRAVENOUS | Status: DC
  Administered 2023-06-06 – 2023-06-08 (×4): 40 mg via INTRAVENOUS
  Filled 2023-06-06 (×4): qty 10

## 2023-06-06 MED ORDER — ACETAMINOPHEN 650 MG RE SUPP: 650.0000 mg | Freq: Four times a day (QID) | RECTAL | Status: DC | PRN

## 2023-06-06 MED ORDER — HEPARIN SODIUM (PORCINE) 5000 UNIT/ML IJ SOLN
5000.0000 [IU] | Freq: Three times a day (TID) | INTRAMUSCULAR | Status: DC
  Administered 2023-06-06 – 2023-06-09 (×8): 5000 [IU] via SUBCUTANEOUS
  Filled 2023-06-06 (×8): qty 1

## 2023-06-06 MED ORDER — LACTATED RINGERS IV BOLUS
1000.0000 mL | Freq: Once | INTRAVENOUS | Status: AC
  Administered 2023-06-06: 1000 mL via INTRAVENOUS

## 2023-06-06 MED ORDER — MORPHINE SULFATE (PF) 2 MG/ML IV SOLN
2.0000 mg | INTRAVENOUS | Status: DC | PRN
  Administered 2023-06-06 – 2023-06-09 (×11): 2 mg via INTRAVENOUS
  Filled 2023-06-06 (×13): qty 1

## 2023-06-06 NOTE — H&P (Signed)
History and Physical    Patient: [name] Horn ZOX:[id number] DOB: 17-Feb-1976 DOA: 06/06/2023 DOS: the patient was seen and examined on 06/07/2023 [facility]CP: [facility]  Patient coming from: Home  Chief Complaint:  Chief Complaint  Patient presents with   Meningitis     HPI: [name] Horn is a 47 y.o. female with medical history significant for Aseptic meningitis in April 2024 coming with headache and fever of 102.8. Patient has a history of antibody deficiency syndrome and was on monthly IVIG until last month when it was suspected to be the cause of her aseptic meningitis. Patient is followed by neurology at [facility] and also has a history of migraines.  Patient also reports that she has been having some neck pain. Patient's past medical history also consist of eosinophilic esophagitis, C. difficile colitis, rheumatoid arthritis, left leg DVT currently not on anticoagulation. Discussed with patient that we will review her chart and that her presentation may not necessarily be aseptic meningitis and it is reassuring that she does not have a white count and CBC is completely normal, patient has a low-grade temp of 99.3.   Patient reports headaches have been going on since about Sunday and the fevers are different than her typical migraine and that that she knows that this is not her typical migraine.  No weakness incontinence speech gait or chest pain or breathing trouble.  No melena no rashes.  Patient states that her RA affects her hands and wrists and there is no synovial thickening on exam.  Initial presentation heart rate of 116 temperature 99.3 respirations 18 blood pressure 121/87. CMP shows bicarb of 20 and glucose 115 otherwise normal, blood cultures collected, CSF labs ordered LP attempted in the emergency room and was not successful.  IR order placed to reattempt lumbar puncture under fluoroscopy guided radiology supervision.  Head CT done was negative for any acute  findings.  Review of Systems: Review of Systems  Constitutional:  Positive for fever.  Gastrointestinal:  Positive for nausea.  Neurological:  Positive for headaches.  All other systems reviewed and are negative.  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   Herpes labialis 08/12/2020   IBD (inflammatory bowel disease)    Intertrigo 06/22/2020   Leukopenia 07/06/2021   Lymphedema    Malaise 12/30/2020   RA (rheumatoid arthritis) (HCC)    Rheumatoid arthritis (HCC) 08/12/2020   Transaminitis 08/12/2020   Uterine cancer [facility])    Past Surgical History:  Procedure Laterality Date   APPENDECTOMY     cheek biopsy  11/2020   CHOLECYSTECTOMY     ESOPHAGOGASTRODUODENOSCOPY N/A 04/30/2017   Procedure: ESOPHAGOGASTRODUODENOSCOPY (EGD);  Surgeon: Dorena Cookey, MD;  Location: [facility] ENDOSCOPY;  Service: Endoscopy;  Laterality: N/A;   HERNIA REPAIR     x2   KNEE SURGERY     x3   MINIMALLY INVASIVE FORAMINOTOMY CERVICAL SPINE     C6-T1, Nitka   SAVORY DILATION N/A 04/30/2017   Procedure: SAVORY DILATION;  Surgeon: Dorena Cookey, MD;  Location: [facility] ENDOSCOPY;  Service: Endoscopy;  Laterality: N/A;   TONSILLECTOMY     TOTAL ABDOMINAL HYSTERECTOMY     Sarcoma, s/p XRT   Social History:  reports  that she has never smoked. She has been exposed to tobacco smoke. She has never used smokeless tobacco. She reports that she does not currently use alcohol. She reports current drug use.  Allergies  Allergen Reactions   Cyanoacrylate Hives   Other Itching and Rash    Dermabond surgical glue.  Dermabond surgical glue-blisters   Sulfa Antibiotics Anaphylaxis, Rash, Shortness Of Breath and Swelling    Angioedema (also)   Sulfonamide Derivatives Hives,  Shortness Of Breath and Swelling    TONGUE SWELLS   Benadryl [Diphenhydramine Hcl] Other (See Comments)    Hyperactivity    Sulfamethoxazole     Other Reaction(s): Angioedema   Diphenhydramine    Diphenhydramine Hcl     Other Reaction(s): Other (See Comments)  HYPERACTIVITY, RESTLESS LEG   Silicone Rash    Watch band    Family History  Problem Relation Age of Onset   Hashimoto's thyroiditis Mother    Eczema Mother    Angioedema Mother    Colonic polyp Mother    Throat cancer Father    Arrhythmia Father    Angioedema Maternal Grandfather    Hypertension Other    Hyperlipidemia Other    Colon cancer Other        grandmother    Prior to Admission medications   Medication Sig Start Date End Date Taking? Authorizing Provider  albuterol (PROVENTIL) (2.5 MG/3ML) 0.083% nebulizer solution Take 3 mLs (2.5 mg total) by nebulization every 4 (four) hours as needed for wheezing or shortness of breath. 11/20/22   Pahwani, Kasandra Knudsen, MD  albuterol (VENTOLIN HFA) 108 (90 Base) MCG/ACT inhaler Inhale 1 puff into the lungs every 4 (four) hours as needed. 04/12/23   [provider]  alosetron (LOTRONEX) 0.5 MG tablet Take 0.5 mg by mouth as needed (rash). 09/23/20   [provider]  amphetamine-dextroamphetamine (ADDERALL) 20 MG tablet Take 20 mg by mouth 3 (three) times daily.    [provider]  atropine 1 % ophthalmic solution Place 1 drop into the left eye daily as needed (for dry eye).    [provider]  baclofen (LIORESAL) 10 MG tablet Take 10 mg by mouth at bedtime. 02/28/20   [provider]  Bepotastine Besilate 1.5 % SOLN Place 1 drop into both eyes 2 (two) times a day.    [provider]  budesonide-formoterol (SYMBICORT) 80-4.5 MCG/ACT inhaler Inhale 2 puffs into the lungs 2 (two) times daily. Can also take Q4 hr as needed for wheezing 11/17/22   Burnadette Pop, MD  cyclobenzaprine (FLEXERIL) 10 MG tablet Take 1 tablet (10 mg total)  by mouth at bedtime. 11/20/22   Pahwani, Kasandra Knudsen, MD  cycloSPORINE (RESTASIS) 0.05 % ophthalmic emulsion Place 1 drop into both eyes 2 (two) times daily.    [provider]  dicyclomine (BENTYL) 10 MG capsule Take 10 mg by mouth daily as needed for nausea/vomiting. 01/02/19   [provider]  Dupilumab 300 MG/2ML SOPN Inject 300 mg into the skin once a week. Friday    [provider]  famotidine (PEPCID) 40 MG tablet Take 40 mg by mouth at bedtime. 05/02/21   [provider]  fluconazole (DIFLUCAN) 150 MG tablet Take 300 mg by mouth every Monday, Wednesday, and Friday.    [provider]  folic acid (FOLVITE) 1 MG tablet Take 2 tablets (2 mg total) by mouth daily. 10/30/19   Pollyann Savoy, MD  furosemide (LASIX) 20 MG tablet Take 60 mg by mouth 2 (two)  times daily.    [provider]  LORazepam (ATIVAN) 1 MG tablet Take 1 mg by mouth 3 (three) times daily as needed for anxiety. 03/08/20   [provider]  magic mouthwash (nystatin, lidocaine, diphenhydrAMINE, alum & mag hydroxide) suspension Swish and spit 5 mLs 3 (three) times daily as needed for mouth pain. 06/10/21   Randall Hiss, MD  meloxicam (MOBIC) 15 MG tablet Take 1 tablet (15 mg total) by mouth as needed for pain. 05/06/23   Lurene Shadow, MD  metoCLOPramide (REGLAN) 10 MG tablet Take 10 mg by mouth 2 (two) times daily.    [provider]  metoprolol succinate (TOPROL-XL) 25 MG 24 hr tablet Take 50 mg by mouth daily.    [provider]  nystatin (MYCOSTATIN) 100000 UNIT/ML suspension Take 10 mLs (1,000,000 Units total) by mouth 3 (three) times daily. 12/07/20   Randall Hiss, MD  nystatin powder Apply 1 application topically 2 (two) times daily as needed (rash).    [provider]  ondansetron (ZOFRAN-ODT) 4 MG disintegrating tablet Take 1 tablet (4 mg total) by mouth every 8 (eight) hours as needed for nausea or vomiting. 06/28/22   Chesley Noon, MD  oxyCODONE-acetaminophen (PERCOCET) 10-325 MG tablet Take 1 tablet by mouth every 4 (four) hours as needed for pain.    [provider]  pantoprazole (PROTONIX) 40 MG tablet Take 1 tablet (40 mg total) by mouth 2 (two) times daily. 05/02/17 08/04/28  Elgergawy, Leana Roe, MD  Polyethyl Glycol-Propyl Glycol (SYSTANE) 0.4-0.3 % GEL ophthalmic gel Place 1 application into both eyes in the morning and at bedtime.    [provider]  Potassium Chloride ER 20 MEQ TBCR Take 20 mEq by mouth daily as needed (when taking fursoemide).    [provider]  pravastatin (PRAVACHOL) 40 MG tablet Take 40 mg by mouth at bedtime.    [provider]  prednisoLONE acetate (PRED FORTE) 1 % ophthalmic suspension Place 1 drop into the left eye as needed (conjunctivitis).    [provider]  promethazine (PHENERGAN) 25 MG tablet Take 1 tablet (25 mg total) by mouth every 6 (six) hours as needed for nausea or vomiting. 09/25/22   Shaune Pollack, MD  SUMAtriptan (IMITREX) 25 MG tablet Take 25 mg by mouth every 2 (two) hours as needed for migraine. May repeat in 2 hours if headache persists or recurs.    [provider]  topiramate (TOPAMAX) 100 MG tablet Take 100 mg by mouth daily. 06/18/19   [provider]  topiramate (TOPAMAX) 25 MG tablet Take 25 mg by mouth at bedtime.     [provider]  triamcinolone lotion (KENALOG) 0.1 % Apply 1 application topically daily as needed (rash). 07/07/20   [provider]  valACYclovir (VALTREX) 1000 MG tablet Take 1 tablet (1,000 mg total) by mouth daily. 08/12/20   Randall Hiss, MD     Vitals:   06/06/23 1813 06/06/23 1931 06/06/23 2030 06/06/23 2251  BP:  (!) 114/90 112/66   Pulse:  81 83   Resp:  16 14   Temp: 98.7 F (37.1 C) 99.1 F (37.3 C)  99.2 F (37.3 C)  TempSrc: Oral Oral  Oral  SpO2:  98% 100%   Weight:       Physical Exam Vitals and nursing note reviewed.   Constitutional:      General: She is not in acute distress. HENT:     Head: Normocephalic  and atraumatic.     Right Ear: Hearing, ear canal and external ear normal. No decreased hearing noted. No drainage or swelling. A middle ear effusion is present. There is no impacted cerumen. Tympanic membrane is not injected, scarred, perforated or erythematous.     Left Ear: Hearing, ear canal and external ear normal. No decreased hearing noted. No drainage or swelling. A middle ear effusion is present. There is no impacted cerumen. Tympanic membrane is not injected, scarred, perforated or erythematous.     Nose: Mucosal edema and congestion present. No nasal deformity or septal deviation.     Right Nostril: No occlusion.     Left Nostril: No occlusion.     Mouth/Throat:     Lips: Pink.     Mouth: Mucous membranes are moist.     Dentition: Abnormal dentition.     Tongue: No lesions. Tongue does not deviate from midline.     Palate: No mass and lesions.     Pharynx: Oropharynx is clear.     Tonsils: No tonsillar exudate.  Eyes:     General: Lids are normal.     Extraocular Movements: Extraocular movements intact.     Pupils: Pupils are equal, round, and reactive to light.  Cardiovascular:     Rate and Rhythm: Normal rate and regular rhythm.     Pulses:          Dorsalis pedis pulses are 2+ on the right side and 2+ on the left side.       Posterior tibial pulses are 2+ on the right side and 2+ on the left side.     Heart sounds: Normal heart sounds.  Pulmonary:     Effort: Pulmonary effort is normal. No tachypnea, bradypnea, accessory muscle usage, prolonged expiration or respiratory distress.     Breath sounds: Normal breath sounds.  Abdominal:     General: Abdomen is protuberant. Bowel sounds are normal. There is no distension.     Palpations: Abdomen is soft. There is no hepatomegaly or mass.     Tenderness: There is no abdominal tenderness.  Musculoskeletal:     Right wrist: No swelling,  deformity or effusion.     Left wrist: No swelling, deformity or effusion.     Right lower leg: No edema.     Left lower leg: No edema.  Skin:    General: Skin is warm.  Neurological:     General: No focal deficit present.     Mental Status: She is alert and oriented to person, place, and time.     Cranial Nerves: Cranial nerves 2-12 are intact. No cranial nerve deficit.     Motor: No weakness.  Psychiatric:        Attention and Perception: Attention normal.        Mood and Affect: Mood normal.        Speech: Speech normal.        Behavior: Behavior normal. Behavior is cooperative.      Labs on Admission: I have personally reviewed following labs and imaging studies  CBC: Recent Labs  Lab 06/06/23 1407  WBC 10.5  HGB 14.3  HCT 42.5  MCV 90.6  PLT 283   Basic Metabolic Panel: Recent Labs  Lab 06/06/23 1407  NA 137  K 4.0  CL 107  CO2 20*  GLUCOSE 115*  BUN 6  CREATININE 0.90  CALCIUM 9.6   GFR: Estimated Creatinine Clearance: 79.3 mL/min (by C-G formula based on SCr of 0.9  mg/dL). Liver Function Tests: Recent Labs  Lab 06/06/23 1407  AST 31  ALT 17  ALKPHOS 93  BILITOT 0.6  PROT 7.6  ALBUMIN 4.2   No results for input(s): "LIPASE", "AMYLASE" in the last 168 hours. No results for input(s): "AMMONIA" in the last 168 hours. Coagulation Profile: No results for input(s): "INR", "PROTIME" in the last 168 hours. Cardiac Enzymes: No results for input(s): "CKTOTAL", "CKMB", "CKMBINDEX", "TROPONINI" in the last 168 hours. BNP (last 3 results) No results for input(s): "PROBNP" in the last 8760 hours. HbA1C: Recent Labs    06/06/23 1346  HGBA1C 5.7*   CBG: No results for input(s): "GLUCAP" in the last 168 hours. Lipid Profile: No results for input(s): "CHOL", "HDL", "LDLCALC", "TRIG", "CHOLHDL", "LDLDIRECT" in the last 72 hours. Thyroid Function Tests: Recent Labs    06/06/23 1407  TSH 0.625  FREET4 0.69   Anemia Panel: Recent Labs     06/06/23 1407  FERRITIN 53   Urine analysis:    Component Value Date/Time   COLORURINE STRAW (A) 05/01/2023 0610   APPEARANCEUR CLEAR (A) 05/01/2023 0610   LABSPEC 1.003 (L) 05/01/2023 0610   PHURINE 8.0 05/01/2023 0610   GLUCOSEU NEGATIVE 05/01/2023 0610   HGBUR NEGATIVE 05/01/2023 0610   HGBUR negative 07/25/2007 1131   BILIRUBINUR NEGATIVE 05/01/2023 0610   KETONESUR NEGATIVE 05/01/2023 0610   PROTEINUR NEGATIVE 05/01/2023 0610   UROBILINOGEN 1.0 05/27/2009 1500   NITRITE NEGATIVE 05/01/2023 0610   LEUKOCYTESUR NEGATIVE 05/01/2023 0610    Radiological Exams on Admission: DG Chest 2 View  Result Date: 06/06/2023 CLINICAL DATA:  Cough, fever EXAM: CHEST - 2 VIEW COMPARISON:  None Available. FINDINGS: Lungs are clear. No pneumothorax or pleural effusion. Cardiac size within normal limits. Pulmonary vascularity is normal. Implanted loop recorder noted. No acute bone abnormality. IMPRESSION: No active cardiopulmonary disease. Electronically Signed   By: Helyn Numbers M.D.   On: 06/06/2023 19:14   CT Head Wo Contrast  Result Date: 06/06/2023 CLINICAL DATA:  Neck pain with nausea, vomiting and fever. EXAM: CT HEAD WITHOUT CONTRAST TECHNIQUE: Contiguous axial images were obtained from the base of the skull through the vertex without intravenous contrast. RADIATION DOSE REDUCTION: This exam was performed according to the departmental dose-optimization program which includes automated exposure control, adjustment of the mA and/or kV according to patient size and/or use of iterative reconstruction technique. COMPARISON:  May 05, 2023 FINDINGS: Brain: No evidence of acute infarction, hemorrhage, hydrocephalus, extra-axial collection or mass lesion/mass effect. Vascular: No hyperdense vessel or unexpected calcification. Skull: Normal. Negative for fracture or focal lesion. Sinuses/Orbits: No acute finding. Other: None. IMPRESSION: Normal head CT. Electronically Signed   By: Aram Candela M.D.    On: 06/06/2023 18:05     Data Reviewed: Relevant notes from primary care and specialist visits, past discharge summaries as available in EHR, including Care Everywhere. Prior diagnostic testing as pertinent to current admission diagnoses Updated medications and problem lists for reconciliation ED course, including vitals, labs, imaging, treatment and response to treatment Triage notes, nursing and pharmacy notes and ED provider's notes Notable results as noted in HPI Assessment and Plan: * Headache Differentials include rebound headache, NSAID associated headache, ENT related headache - ETD/ SINUS , reflux related headache, migraine, aseptic meningitis headache. Head CT is negative, blood work reassuring for any infection related headache. Clinically patient has no white count no blurred vision.  Do not suspect infectious headache currently.  Will start patient on broad-spectrum IV antibiotic coverage for meningitis.  Pharmacy consult.  As needed Tylenol, as needed morphine, IV PPI.  IR consult placed for LP in AM. Neurology consult per am team.  ID consult as deemed appropriate. Avoid meloxicam / NSAIDS .   Antibody deficiency syndrome (HCC) Currently patient is off IV immunoglobulin therapy. And has been followed by [facility].   RA (rheumatoid arthritis) (HCC) Patient states the RA affects her hands and wrists. Is currently off methotrexate. Will obtain x-rays if any pain. Currently pt is asymptomatic and has not swelling or pain. .   Rheumatoid arthritis [facility]) Rheumatologist consult per am team.    Radiculopathy of cervical region Will consider MRI c-spine if neck pain persist or worsens.  No weakness / no thenar atrophy.   DVT prophylaxis:  Heparin  Consults:  None  Advance Care Planning:    Code Status: Full Code   Family Communication:  Spouse  Disposition Plan:  Back to previous home environment  Severity of Illness: The appropriate patient status for this  patient is OBSERVATION. Observation status is judged to be reasonable and necessary in order to provide the required intensity of service to ensure the patient's safety. The patient's presenting symptoms, physical exam findings, and initial radiographic and laboratory data in the context of their medical condition is felt to place them at decreased risk for further clinical deterioration. Furthermore, it is anticipated that the patient will be medically stable for discharge from the hospital within 2 midnights of admission.   Author: Gertha Calkin, MD 06/07/2023 12:37 AM  For on call review [email or web address].

## 2023-06-06 NOTE — ED Notes (Signed)
Pt hospitalized with viral meningitis 1 month ago. Extensive PMH, RA, autoimmune disorders. . States has had 99-101 fever for last 3 days. HA since this weekend. Nauseous.

## 2023-06-06 NOTE — ED Triage Notes (Signed)
Pt sts that she was here a month ago with viral meningitis and feels like the same. Pt sts that she has been having neck pain with N/V as well as fevers.

## 2023-06-06 NOTE — ED Provider Notes (Signed)
[facility] Provider Note    Event Date/Time   First MD Initiated Contact with Patient 06/06/23 1502     (approximate)   History   Chief Complaint Meningitis    HPI  [name] Horn is a 47 y.o. female with past medical history of hyperlipidemia, migraines, antibody deficiency receiving regular IV IVIG, asthma, rheumatoid arthritis, and DVT who presents to the ED complaining of headache.  Patient was previously admitted to the hospital for a symptom meningitis about 1 month ago, meningitis thought to be secondary to IVIG.  She states she was feeling better at the time of discharge, but has held off on any additional IVIG since then and is not taking any immunosuppression medications for her RA currently.  She has been dealing with increasing headache for about the past 3 days along with stiffness in her neck.  She reports fevers as high as 103, has not taken any Tylenol or ibuprofen recently.  She has had some nausea, vomiting, and light sensitivity with this, but denies any numbness or weakness in her extremities.  She describes symptoms as similar to when she was admitted for meningitis previously.     Physical Exam   Triage Vital Signs: ED Triage Vitals  Enc Vitals Group     BP 06/06/23 1338 121/87     Pulse Rate 06/06/23 1338 (!) 116     Resp 06/06/23 1338 18     Temp 06/06/23 1338 99.3 F (37.4 C)     Temp Source 06/06/23 1338 Oral     SpO2 06/06/23 1338 96 %     Weight 06/06/23 1339 181 lb (82.1 kg)     Height --      Head Circumference --      Peak Flow --      Pain Score 06/06/23 1339 8     Pain Loc --      Pain Edu? --      Excl. in GC? --     Most recent vital signs: Vitals:   06/06/23 1730 06/06/23 1813  BP: 125/84   Pulse: 99   Resp: 16   Temp:  98.7 F (37.1 C)  SpO2: 99%     Constitutional: Alert and oriented. Eyes: Conjunctivae are normal. Head: Atraumatic. Nose: No congestion/rhinnorhea. Mouth/Throat: Mucous membranes  are moist.  Neck: Mild stiffness noted, no overt meningismus. Cardiovascular: Normal rate, regular rhythm. Grossly normal heart sounds.  2+ radial pulses bilaterally. Respiratory: Normal respiratory effort.  No retractions. Lungs CTAB. Gastrointestinal: Soft and nontender. No distention. Musculoskeletal: No lower extremity tenderness nor edema.  Neurologic:  Normal speech and language. No gross focal neurologic deficits are appreciated.    ED Results / Procedures / Treatments   Labs (all labs ordered are listed, but only abnormal results are displayed) Labs Reviewed  COMPREHENSIVE METABOLIC PANEL - Abnormal; Notable for the following components:      Result Value   CO2 20 (*)    Glucose, Bld 115 (*)    All other components within normal limits  CULTURE, BLOOD (ROUTINE X 2)  CULTURE, BLOOD (ROUTINE X 2)  CSF CULTURE W GRAM STAIN  CBC  LACTIC ACID, PLASMA  PROCALCITONIN  MENINGITIS/ENCEPHALITIS PANEL (CSF)  CSF CELL COUNT WITH DIFFERENTIAL  PROTEIN AND GLUCOSE, CSF  SEDIMENTATION RATE  C-REACTIVE PROTEIN   RADIOLOGY CT head reviewed and interpreted by me with no hemorrhage or midline shift.  PROCEDURES:  Critical Care performed: No  .Lumbar Puncture  Date/Time: 06/06/2023 7:17  PM  Performed by: Chesley Noon, MD Authorized by: Chesley Noon, MD   Consent:    Consent obtained:  Written   Consent given by:  Patient   Risks, benefits, and alternatives were discussed: yes     Risks discussed:  Bleeding, pain, infection, repeat procedure, nerve damage and headache Universal protocol:    Patient identity confirmed:  Verbally with patient and arm band Pre-procedure details:    Procedure purpose:  Diagnostic   Preparation: Patient was prepped and draped in usual sterile fashion   Anesthesia:    Anesthesia method:  Local infiltration   Local anesthetic:  Lidocaine 1% w/o epi Procedure details:    Lumbar space:  L4-L5 interspace   Patient position:  Sitting    Needle gauge:  20   Needle type:  Spinal needle - Quincke tip   Needle length (in):  2.5   Ultrasound guidance: no     Number of attempts:  2 Post-procedure details:    Puncture site:  Adhesive bandage applied   Procedure completion:  Procedure terminated at patient's request Comments:     Initial attempt at lumbar puncture was unsuccessful, second attempt needed to be aborted after patient developed vasovagal episode with small amount of emesis.    MEDICATIONS ORDERED IN ED: Medications  lactated ringers bolus 1,000 mL (has no administration in time range)  prochlorperazine (COMPAZINE) injection 10 mg (10 mg Intravenous Given 06/06/23 1715)  morphine (PF) 4 MG/ML injection 4 mg (4 mg Intravenous Given 06/06/23 1844)     IMPRESSION / MDM / ASSESSMENT AND PLAN / ED COURSE  I reviewed the triage vital signs and the nursing notes.                              47 y.o. female with past medical history of hyperlipidemia, antibody deficiency on IVIG, migraines, asthma, RA, and DVT who presents to the ED complaining of increasing headache, neck stiffness, fevers, nausea, and vomiting for the past 3 days.  Patient's presentation is most consistent with acute presentation with potential threat to life or bodily function.  Differential diagnosis includes, but is not limited to, bacterial meningitis, aseptic meningitis, migraine headache, sepsis, pneumonia, UTI, electrolyte abnormality, AKI.  Patient uncomfortable with nontoxic-appearing and in no acute distress, vital signs remarkable for tachycardia but otherwise reassuring.  She has mild stiffness of her neck on exam but does not appear overtly meningitic, however with her history of aseptic meningitis, I am concerned for recurrence of this.  It was previously attributed to IVIG, but she has not had IVIG since previous admission.  Labs are reassuring without significant anemia, leukocytosis, showed abnormality, or AKI.  LFTs are unremarkable,  lactic acid within normal limits and procalcitonin undetectable.  Plan to perform lumbar puncture and reassess.  Lumbar puncture was attempted but unsuccessful, I was unable to obtain spinal fluid on first attempt and during second attempt, patient developed vasovagal episode and had small amount of emesis.  Order placed for LP in the morning by radiology, will hold off on antibiotics for now as I have low suspicion for bacterial meningitis.  Case discussed with hospitalist for admission.      FINAL CLINICAL IMPRESSION(S) / ED DIAGNOSES   Final diagnoses:  Acute nonintractable headache, unspecified headache type  Fever, unspecified fever cause     Rx / DC Orders   ED Discharge Orders     None  Note:  This document was prepared using Dragon voice recognition software and may include unintentional dictation errors.   Chesley Noon, MD 06/06/23 1919

## 2023-06-06 NOTE — ED Notes (Signed)
Pt in CT at this time.

## 2023-06-07 ENCOUNTER — Other Ambulatory Visit: Payer: Self-pay

## 2023-06-07 ENCOUNTER — Observation Stay: Payer: BC Managed Care – PPO

## 2023-06-07 ENCOUNTER — Encounter: Payer: Self-pay | Admitting: Internal Medicine

## 2023-06-07 DIAGNOSIS — G030 Nonpyogenic meningitis: Secondary | ICD-10-CM

## 2023-06-07 DIAGNOSIS — G43909 Migraine, unspecified, not intractable, without status migrainosus: Secondary | ICD-10-CM

## 2023-06-07 DIAGNOSIS — M05719 Rheumatoid arthritis with rheumatoid factor of unspecified shoulder without organ or systems involvement: Secondary | ICD-10-CM | POA: Diagnosis not present

## 2023-06-07 DIAGNOSIS — Z8542 Personal history of malignant neoplasm of other parts of uterus: Secondary | ICD-10-CM | POA: Diagnosis not present

## 2023-06-07 DIAGNOSIS — D806 Antibody deficiency with near-normal immunoglobulins or with hyperimmunoglobulinemia: Secondary | ICD-10-CM

## 2023-06-07 DIAGNOSIS — M069 Rheumatoid arthritis, unspecified: Secondary | ICD-10-CM

## 2023-06-07 DIAGNOSIS — D809 Immunodeficiency with predominantly antibody defects, unspecified: Secondary | ICD-10-CM | POA: Diagnosis present

## 2023-06-07 DIAGNOSIS — Z8616 Personal history of COVID-19: Secondary | ICD-10-CM | POA: Diagnosis not present

## 2023-06-07 DIAGNOSIS — Z7951 Long term (current) use of inhaled steroids: Secondary | ICD-10-CM | POA: Diagnosis not present

## 2023-06-07 DIAGNOSIS — Z79620 Long term (current) use of immunosuppressive biologic: Secondary | ICD-10-CM | POA: Diagnosis not present

## 2023-06-07 DIAGNOSIS — E876 Hypokalemia: Secondary | ICD-10-CM | POA: Diagnosis not present

## 2023-06-07 DIAGNOSIS — A689 Relapsing fever, unspecified: Secondary | ICD-10-CM | POA: Diagnosis present

## 2023-06-07 DIAGNOSIS — Z800 Family history of malignant neoplasm of digestive organs: Secondary | ICD-10-CM | POA: Diagnosis not present

## 2023-06-07 DIAGNOSIS — Z923 Personal history of irradiation: Secondary | ICD-10-CM | POA: Diagnosis not present

## 2023-06-07 DIAGNOSIS — M059 Rheumatoid arthritis with rheumatoid factor, unspecified: Secondary | ICD-10-CM

## 2023-06-07 DIAGNOSIS — Z882 Allergy status to sulfonamides status: Secondary | ICD-10-CM | POA: Diagnosis not present

## 2023-06-07 DIAGNOSIS — G2581 Restless legs syndrome: Secondary | ICD-10-CM | POA: Diagnosis present

## 2023-06-07 DIAGNOSIS — Z79899 Other long term (current) drug therapy: Secondary | ICD-10-CM | POA: Diagnosis not present

## 2023-06-07 DIAGNOSIS — E785 Hyperlipidemia, unspecified: Secondary | ICD-10-CM | POA: Diagnosis present

## 2023-06-07 DIAGNOSIS — Z83719 Family history of colon polyps, unspecified: Secondary | ICD-10-CM | POA: Diagnosis not present

## 2023-06-07 DIAGNOSIS — Z8249 Family history of ischemic heart disease and other diseases of the circulatory system: Secondary | ICD-10-CM | POA: Diagnosis not present

## 2023-06-07 DIAGNOSIS — B009 Herpesviral infection, unspecified: Secondary | ICD-10-CM | POA: Diagnosis present

## 2023-06-07 DIAGNOSIS — M5412 Radiculopathy, cervical region: Secondary | ICD-10-CM | POA: Diagnosis present

## 2023-06-07 DIAGNOSIS — R519 Headache, unspecified: Secondary | ICD-10-CM | POA: Diagnosis present

## 2023-06-07 DIAGNOSIS — Z808 Family history of malignant neoplasm of other organs or systems: Secondary | ICD-10-CM | POA: Diagnosis not present

## 2023-06-07 DIAGNOSIS — R509 Fever, unspecified: Secondary | ICD-10-CM | POA: Diagnosis not present

## 2023-06-07 DIAGNOSIS — Z90710 Acquired absence of both cervix and uterus: Secondary | ICD-10-CM | POA: Diagnosis not present

## 2023-06-07 DIAGNOSIS — Z86718 Personal history of other venous thrombosis and embolism: Secondary | ICD-10-CM | POA: Diagnosis not present

## 2023-06-07 DIAGNOSIS — J45909 Unspecified asthma, uncomplicated: Secondary | ICD-10-CM | POA: Diagnosis present

## 2023-06-07 LAB — CBC
HCT: 33.3 % — ABNORMAL LOW (ref 36.0–46.0)
Hemoglobin: 11.1 g/dL — ABNORMAL LOW (ref 12.0–15.0)
MCH: 30 pg (ref 26.0–34.0)
MCHC: 33.3 g/dL (ref 30.0–36.0)
MCV: 90 fL (ref 80.0–100.0)
Platelets: 224 10*3/uL (ref 150–400)
RBC: 3.7 MIL/uL — ABNORMAL LOW (ref 3.87–5.11)
RDW: 14.3 % (ref 11.5–15.5)
WBC: 9.4 10*3/uL (ref 4.0–10.5)
nRBC: 0 % (ref 0.0–0.2)

## 2023-06-07 LAB — CULTURE, BLOOD (ROUTINE X 2): Special Requests: ADEQUATE

## 2023-06-07 LAB — COMPREHENSIVE METABOLIC PANEL
ALT: 15 U/L (ref 0–44)
AST: 28 U/L (ref 15–41)
Albumin: 3.1 g/dL — ABNORMAL LOW (ref 3.5–5.0)
Alkaline Phosphatase: 80 U/L (ref 38–126)
Anion gap: 10 (ref 5–15)
BUN: 9 mg/dL (ref 6–20)
CO2: 22 mmol/L (ref 22–32)
Calcium: 8.2 mg/dL — ABNORMAL LOW (ref 8.9–10.3)
Chloride: 109 mmol/L (ref 98–111)
Creatinine, Ser: 0.87 mg/dL (ref 0.44–1.00)
GFR, Estimated: >60 mL/min (ref >60)
Glucose, Bld: 101 mg/dL — ABNORMAL HIGH (ref 70–99)
Potassium: 3.3 mmol/L — ABNORMAL LOW (ref 3.5–5.1)
Sodium: 141 mmol/L (ref 135–145)
Total Bilirubin: 0.5 mg/dL (ref 0.3–1.2)
Total Protein: 6.1 g/dL — ABNORMAL LOW (ref 6.5–8.1)

## 2023-06-07 LAB — VITAMIN B12: Vitamin B-12: 203 pg/mL (ref 180–914)

## 2023-06-07 MED ORDER — DEXTROSE 5 % IV SOLN
10.0000 mg/kg | Freq: Three times a day (TID) | INTRAVENOUS | Status: DC
  Administered 2023-06-07 (×2): 655 mg via INTRAVENOUS
  Filled 2023-06-07 (×3): qty 13.1

## 2023-06-07 MED ORDER — DEXTROSE 5 % IV SOLN
10.0000 mg/kg | Freq: Three times a day (TID) | INTRAVENOUS | Status: DC
  Filled 2023-06-07: qty 13.1

## 2023-06-07 MED ORDER — VANCOMYCIN HCL IN DEXTROSE 1-5 GM/200ML-% IV SOLN
1000.0000 mg | Freq: Two times a day (BID) | INTRAVENOUS | Status: DC
  Administered 2023-06-07: 1000 mg via INTRAVENOUS
  Filled 2023-06-07 (×2): qty 200

## 2023-06-07 MED ORDER — ONDANSETRON HCL 4 MG/2ML IJ SOLN
4.0000 mg | Freq: Once | INTRAMUSCULAR | Status: AC
  Administered 2023-06-07: 4 mg via INTRAVENOUS
  Filled 2023-06-07: qty 2

## 2023-06-07 MED ORDER — ONDANSETRON HCL 4 MG/2ML IJ SOLN
4.0000 mg | Freq: Four times a day (QID) | INTRAMUSCULAR | Status: DC | PRN
  Administered 2023-06-07 – 2023-06-09 (×6): 4 mg via INTRAVENOUS
  Filled 2023-06-07 (×6): qty 2

## 2023-06-07 MED ORDER — LIDOCAINE HCL (PF) 1 % IJ SOLN
10.0000 mL | Freq: Once | INTRAMUSCULAR | Status: AC
  Administered 2023-06-07: 5 mL

## 2023-06-07 MED ORDER — VANCOMYCIN HCL 1750 MG/350ML IV SOLN: 1750.0000 mg | INTRAVENOUS | Status: DC

## 2023-06-07 MED ORDER — OXYCODONE-ACETAMINOPHEN 5-325 MG PO TABS
1.0000 | ORAL_TABLET | ORAL | Status: DC | PRN
  Administered 2023-06-08: 1 via ORAL
  Administered 2023-06-08 – 2023-06-09 (×6): 2 via ORAL
  Filled 2023-06-07 (×2): qty 2
  Filled 2023-06-07: qty 1
  Filled 2023-06-07 (×4): qty 2

## 2023-06-07 MED ORDER — STERILE WATER FOR INJECTION IJ SOLN
INTRAMUSCULAR | Status: AC
  Administered 2023-06-07: 10 mL
  Filled 2023-06-07: qty 10

## 2023-06-07 MED ORDER — POTASSIUM CHLORIDE CRYS ER 20 MEQ PO TBCR
40.0000 meq | EXTENDED_RELEASE_TABLET | Freq: Once | ORAL | Status: AC
  Administered 2023-06-07: 40 meq via ORAL
  Filled 2023-06-07: qty 2

## 2023-06-07 MED ORDER — VANCOMYCIN HCL 1750 MG/350ML IV SOLN
1750.0000 mg | Freq: Once | INTRAVENOUS | Status: AC
  Administered 2023-06-07: 1750 mg via INTRAVENOUS
  Filled 2023-06-07 (×2): qty 350

## 2023-06-07 MED ORDER — SODIUM CHLORIDE 0.9 % IV SOLN: 1.0000 g | INTRAVENOUS | Status: DC

## 2023-06-07 MED ORDER — ORAL CARE MOUTH RINSE
15.0000 mL | OROMUCOSAL | Status: DC | PRN
  Administered 2023-06-08: 15 mL via OROMUCOSAL

## 2023-06-07 MED ORDER — LACTATED RINGERS IV SOLN: INTRAVENOUS | Status: AC

## 2023-06-07 MED ORDER — SODIUM CHLORIDE 0.9 % IV SOLN
2.0000 g | Freq: Two times a day (BID) | INTRAVENOUS | Status: DC
  Administered 2023-06-07 (×2): 2 g via INTRAVENOUS
  Filled 2023-06-07 (×3): qty 20

## 2023-06-07 NOTE — Progress Notes (Signed)
Pharmacy Antibiotic Note  [name] Horn is a 47 y.o. female admitted on 06/06/2023 with meningitis.  Pharmacy has been consulted for Vancomycin & Acyclovir dosing.  Plan: Acyclovir 10 mg/kg (ABW) q8hr per indication, BMI & renal fxn.  Pt given Vancomycin 1750 mg once. Vancomycin 1750 mg IV Q 24 hrs. Goal AUC 400-550. Expected AUC: 500.2 SCr used: 0.9  Pharmacy will continue to follow and will adjust abx dosing whenever warranted.  Temp (24hrs), Avg:99.1 F (37.3 C), Min:98.7 F (37.1 C), Max:99.3 F (37.4 C)   Recent Labs  Lab 06/06/23 1407 06/06/23 1603  WBC 10.5  --   CREATININE 0.90  --   LATICACIDVEN  --  1.2    Estimated Creatinine Clearance: 79.3 mL/min (by C-G formula based on SCr of 0.9 mg/dL).    Allergies  Allergen Reactions   Cyanoacrylate Hives   Other Itching and Rash    Dermabond surgical glue.  Dermabond surgical glue-blisters   Sulfa Antibiotics Anaphylaxis, Rash, Shortness Of Breath and Swelling    Angioedema (also)   Sulfonamide Derivatives Hives, Shortness Of Breath and Swelling    TONGUE SWELLS   Benadryl [Diphenhydramine Hcl] Other (See Comments)    Hyperactivity    Sulfamethoxazole     Other Reaction(s): Angioedema   Diphenhydramine    Diphenhydramine Hcl     Other Reaction(s): Other (See Comments)  HYPERACTIVITY, RESTLESS LEG   Silicone Rash    Watch band    Antimicrobials this admission: 6/12 Ceftriaxone >>  6/12 Vancomycin >>  6/12 Acyclovir >>   Microbiology results: 6/11 BCx: Pending 6/11 CSFCx: Pending  Thank you for allowing pharmacy to be a part of this patient's care.  Otelia Sergeant, PharmD, [facility] 06/07/2023 1:14 AM

## 2023-06-07 NOTE — Assessment & Plan Note (Addendum)
See RA

## 2023-06-07 NOTE — Assessment & Plan Note (Addendum)
Consider MRI c-spine if neck pain persist or worsens.  No weakness / no thenar atrophy.

## 2023-06-07 NOTE — Assessment & Plan Note (Addendum)
Currently patient is off IVIG therapy. Followed by [facility].

## 2023-06-07 NOTE — Assessment & Plan Note (Addendum)
Unlike her typical migraines. Differentials include rebound headache, NSAID associated headache, ENT related headache - ETD/ SINUS, reflux related headache, migraine, recurrent aseptic meningitis. Head CT is negative, blood work reassuring for any infection related headache.  No leukocytosis. Pt did report fevers at home Tmax 102.   LP unsuccessful in the ED. 6/12 -- IR attempted LP, unsuccessful 6/13 -- IR attempted LP again, but no return of any CSF No further fevers have occured She was initially started on empiric meningitis treatment for now -- IV Vanc, Rocephin, Acyclovir -- stopped 6/12 per ID.  Remained clinically stable off antibiotics/antiviral. --Neurology and ID were consulted - see their recommendations --Continue home migraine regimen --Some improvement with Compazine, short term Rx sent for d/c --Follow up at [facility] --Follow up pending ID's labs

## 2023-06-07 NOTE — Progress Notes (Signed)
Progress Note   Patient: [name] Horn ZOX:[id number] DOB: [birth year] DOA: 06/06/2023     0 DOS: the patient was seen and examined on 06/07/2023   Brief hospital course: HPI on admission 06/06/23 by Dr. Renaldo Reel: "[name] Horn is a 47 y.o. female with medical history significant for Aseptic meningitis in April 2024 coming with headache and fever of 102.8. Patient has a history of antibody deficiency syndrome and was on monthly IVIG until last month when it was suspected to be the cause of her aseptic meningitis. Patient is followed by neurology at [facility] and also has a history of migraines.  Patient also reports that she has been having some neck pain. Patient's past medical history also consist of eosinophilic esophagitis, C. difficile colitis, rheumatoid arthritis, left leg DVT currently not on anticoagulation. Discussed with patient that we will review her chart and that her presentation may not necessarily be aseptic meningitis and it is reassuring that she does not have a white count and CBC is completely normal, patient has a low-grade temp of 99.3.   Patient reports headaches have been going on since about Sunday and the fevers are different than her typical migraine and that that she knows that this is not her typical migraine.  No weakness incontinence speech gait or chest pain or breathing trouble.  No melena no rashes.  Patient states that her RA affects her hands and wrists and there is no synovial thickening on exam.  Initial presentation heart rate of 116 temperature 99.3 respirations 18 blood pressure 121/87. CMP shows bicarb of 20 and glucose 115 otherwise normal, blood cultures collected, CSF labs ordered LP attempted in the emergency room and was not successful.  IR order placed to reattempt lumbar puncture under fluoroscopy guided radiology supervision.  Head CT done was negative for any acute findings."  Pt was admitted to the hospital, started on empiric antibiotics and antiviral  therapies for possible meningitis.    Consults:  Neurology  Infectious Disease    Assessment and Plan: * Headache Differentials include rebound headache, NSAID associated headache, ENT related headache - ETD/ SINUS, reflux related headache, migraine, recurrent aseptic meningitis. Head CT is negative, blood work reassuring for any infection related headache.  No leukocytosis. Pt did report fevers at home Tmax 102.   LP unsuccessful in the ED. 6/12 -- IR attempted LP today, also unsuccessful --Continue empiric meningitis treatment for now -- IV Vanc, Rocephin, Acyclovir --IR to re-attempt LP tomorrow AM after further IV hydration. --Continue IV fluids --Neurology and Infectious disease consulted - appreciate input --Tylenol, as needed morphine, IV PPI.  --Avoid meloxicam / NSAIDS   Antibody deficiency syndrome (HCC) Currently patient is off IVIG therapy. Followed by [facility].   RA (rheumatoid arthritis) (HCC) Seems stable, not flared. Patient reports RA affects hands and wrists. Currently off methotrexate. Supportive care / pain control PRN.  Rheumatoid arthritis (HCC) See RA   Radiculopathy of cervical region Consider MRI c-spine if neck pain persist or worsens.  No weakness / no thenar atrophy.         Subjective: Pt awake resting in bed, seen after IR attempted LP this AM.  She reports persistent severe headache, whole top of her head.  Also has upper neck pain and stiffness.  Reports temps at home as high as 102 F.  Headaches fluctuate, wax and wane but overall remain severe and unlike her usual migraines.    Physical Exam: Vitals:   06/07/23 8119 06/07/23 0358 06/07/23 1478  06/07/23 1546  BP: 119/83  104/65 104/64  Pulse: 88  89 97  Resp: 18  18 19   Temp: 98.4 F (36.9 C)  98.1 F (36.7 C) 98.6 F (37 C)  TempSrc: Oral     SpO2: 99%  97% 99%  Weight:  82 kg    Height:  5\' 4"  (1.626 m)     General exam: awake, alert, no acute distress HEENT:  squinting eyes, moist mucus membranes, hearing grossly normal  Respiratory system: CTAB, no wheezes, rales or rhonchi, normal respiratory effort. Cardiovascular system: normal S1/S2, RRR, no JVD, murmurs, rubs, gallops, no pedal edema.   Gastrointestinal system: soft, NT, ND, no HSM felt, +bowel sounds. Central nervous system: A&O x3. no gross focal neurologic deficits, normal speech Extremities: moves all, no edema, normal tone Skin: dry, intact, normal temperature Psychiatry: normal mood, congruent affect, judgement and insight appear normal   Data Reviewed:  Notable labs --- K 3.3, glucose 101, Ca 8.2, Albumin 3.1, Tprotein 6.1, Hbg 11.1 from 14.3 (on aggressive IV fluids), procal < 0.10.  CRP normal 0.5, lactic acid normal 1.2, Sed rate elevated 27,   UDS positive for amphetamines, opiates, tricyclics  Micro - blood cultures pending   Family Communication: None. Pt is able to update.  Disposition: Status is: Inpatient Remains inpatient appropriate because: ongoing evaluation, ID and Neurology consults pending, re-attempt LP tomorrow with IR.   Planned Discharge Destination: Home    Time spent: 46 minutes  Author: Pennie Banter, DO 06/07/2023 4:12 PM  For on call review [email or web address].

## 2023-06-07 NOTE — Assessment & Plan Note (Signed)
K 3.3 this AM, oral replacement ordered. Monitor BMP. Check Mg level.

## 2023-06-07 NOTE — Consult Note (Addendum)
NEURO HOSPITALIST CONSULT NOTE   Requesting physician: Dr. Denton Lank  Reason for Consult: Progressive headache x 3 days  History obtained from: Patient and Chart     HPI:                                                                                                                                          [name] Horn is a 47 y.o. 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  LP was attempted in the ED but was unsuccessful.She has been started on empiric ABX coverage.   Dr. Sharen Hones saw the patient on 05/04/23 during the patient's recent admission. The patient at that time had presented to hospital with headache, fever, and chills, with a temp up to 103 at home and neck stiffness radiating to the occipital area. She was feelingt nauseated as well. LP in the ED on 5/6 had shown markedly elevated WBC of 632 without neutrophilic predominance, protein of 75 and glucose of 53.  Meningitis/encephalitis panel was negative. She had reported persistent neck stiffness and headache despite sumatriptan, oxycodone, morphine, baclofen, flexeril, reglan, and topiramate. Per Dr. Sharen Hones note, the patient did not have any other features typically associated with an autoimmune encephalopathy such as cognitive or behavioral change or seizures. Moreover, her cell count was higher than would be expected in paraneoplastic syndrome and her uterine cancer had been in remission for 20 years. MRI brain with and without contrast was negative. CTV head was negative. ANA comprehensive panel was ordered at that time,  which came back negative. Serum paraneoplastic panel was also ordered, which came back negative. RPR was negative. Multiple HIV tests, most recently in 2023, have come back negative.   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   Herpes labialis 08/12/2020   IBD (inflammatory bowel disease)    Intertrigo 06/22/2020   Leukopenia 07/06/2021   Lymphedema    Malaise 12/30/2020   RA (rheumatoid arthritis) (HCC)    Rheumatoid arthritis (HCC) 08/12/2020   Transaminitis 08/12/2020   Uterine cancer [facility])     Past Surgical History:  Procedure Laterality Date   APPENDECTOMY     cheek biopsy  11/2020  CHOLECYSTECTOMY     ESOPHAGOGASTRODUODENOSCOPY N/A 04/30/2017   Procedure: ESOPHAGOGASTRODUODENOSCOPY (EGD);  Surgeon: Dorena Cookey, MD;  Location: [facility] ENDOSCOPY;  Service: Endoscopy;  Laterality: N/A;   HERNIA REPAIR     x2   KNEE SURGERY     x3   MINIMALLY INVASIVE FORAMINOTOMY CERVICAL SPINE     C6-T1, Nitka   SAVORY DILATION N/A 04/30/2017   Procedure: SAVORY DILATION;  Surgeon: Dorena Cookey, MD;  Location: [facility] ENDOSCOPY;  Service: Endoscopy;  Laterality: N/A;   TONSILLECTOMY     TOTAL ABDOMINAL HYSTERECTOMY     Sarcoma, s/p XRT    Family History  Problem Relation Age of Onset   Hashimoto's thyroiditis Mother    Eczema Mother    Angioedema Mother    Colonic polyp Mother    Throat cancer Father    Arrhythmia Father    Angioedema Maternal Grandfather    Hypertension Other    Hyperlipidemia Other    Colon cancer Other        grandmother             Social History:  reports that she has never smoked. She has been exposed to tobacco smoke. She has never used smokeless tobacco. She reports that she does not currently use alcohol. She reports  current drug use.  Allergies  Allergen Reactions   Cyanoacrylate Hives   Other Itching and Rash    Dermabond surgical glue.  Dermabond surgical glue-blisters   Sulfa Antibiotics Anaphylaxis, Rash, Shortness Of Breath and Swelling    Angioedema (also)   Sulfonamide Derivatives Hives, Shortness Of Breath and Swelling    TONGUE SWELLS   Benadryl [Diphenhydramine Hcl] Other (See Comments)    Hyperactivity    Sulfamethoxazole     Other Reaction(s): Angioedema   Diphenhydramine    Diphenhydramine Hcl     Other Reaction(s): Other (See Comments)  HYPERACTIVITY, RESTLESS LEG   Silicone Rash    Watch band    MEDICATIONS:                                                                                                                     Prior to Admission:  Medications Prior to Admission  Medication Sig Dispense Refill Last Dose   albuterol (PROVENTIL) (2.5 MG/3ML) 0.083% nebulizer solution Take 3 mLs (2.5 mg total) by nebulization every 4 (four) hours as needed for wheezing or shortness of breath. 75 mL 3    albuterol (VENTOLIN HFA) 108 (90 Base) MCG/ACT inhaler Inhale 1 puff into the lungs every 4 (four) hours as needed.      alosetron (LOTRONEX) 0.5 MG tablet Take 0.5 mg by mouth as needed (rash).      amphetamine-dextroamphetamine (ADDERALL) 20 MG tablet Take 20 mg by mouth 3 (three) times daily.      atropine 1 % ophthalmic solution Place 1 drop into the left eye daily as needed (for dry eye).      baclofen (LIORESAL) 10 MG tablet Take 10 mg by mouth at bedtime.  Bepotastine Besilate 1.5 % SOLN Place 1 drop into both eyes 2 (two) times a day.      budesonide-formoterol (SYMBICORT) 80-4.5 MCG/ACT inhaler Inhale 2 puffs into the lungs 2 (two) times daily. Can also take Q4 hr as needed for wheezing 10.2 each 1    cyclobenzaprine (FLEXERIL) 10 MG tablet Take 1 tablet (10 mg total) by mouth at bedtime. 30 tablet 0    cycloSPORINE (RESTASIS) 0.05 % ophthalmic emulsion Place 1 drop into  both eyes 2 (two) times daily.      dicyclomine (BENTYL) 10 MG capsule Take 10 mg by mouth daily as needed for nausea/vomiting.      Dupilumab 300 MG/2ML SOPN Inject 300 mg into the skin once a week. Friday      famotidine (PEPCID) 40 MG tablet Take 40 mg by mouth at bedtime.      fluconazole (DIFLUCAN) 150 MG tablet Take 300 mg by mouth every Monday, Wednesday, and Friday.      folic acid (FOLVITE) 1 MG tablet Take 2 tablets (2 mg total) by mouth daily. 180 tablet 3    furosemide (LASIX) 20 MG tablet Take 60 mg by mouth 2 (two) times daily.      LORazepam (ATIVAN) 1 MG tablet Take 1 mg by mouth 3 (three) times daily as needed for anxiety.      magic mouthwash (nystatin, lidocaine, diphenhydrAMINE, alum & mag hydroxide) suspension Swish and spit 5 mLs 3 (three) times daily as needed for mouth pain. 180 mL 1    meloxicam (MOBIC) 15 MG tablet Take 1 tablet (15 mg total) by mouth as needed for pain.      metoCLOPramide (REGLAN) 10 MG tablet Take 10 mg by mouth 2 (two) times daily.      metoprolol succinate (TOPROL-XL) 25 MG 24 hr tablet Take 50 mg by mouth daily.      nystatin (MYCOSTATIN) 100000 UNIT/ML suspension Take 10 mLs (1,000,000 Units total) by mouth 3 (three) times daily. 60 mL 2    nystatin powder Apply 1 application topically 2 (two) times daily as needed (rash).      ondansetron (ZOFRAN-ODT) 4 MG disintegrating tablet Take 1 tablet (4 mg total) by mouth every 8 (eight) hours as needed for nausea or vomiting. 12 tablet 0    oxyCODONE-acetaminophen (PERCOCET) 10-325 MG tablet Take 1 tablet by mouth every 4 (four) hours as needed for pain.      pantoprazole (PROTONIX) 40 MG tablet Take 1 tablet (40 mg total) by mouth 2 (two) times daily. 60 tablet 1    Polyethyl Glycol-Propyl Glycol (SYSTANE) 0.4-0.3 % GEL ophthalmic gel Place 1 application into both eyes in the morning and at bedtime.      Potassium Chloride ER 20 MEQ TBCR Take 20 mEq by mouth daily as needed (when taking fursoemide).       pravastatin (PRAVACHOL) 40 MG tablet Take 40 mg by mouth at bedtime.      prednisoLONE acetate (PRED FORTE) 1 % ophthalmic suspension Place 1 drop into the left eye as needed (conjunctivitis).      promethazine (PHENERGAN) 25 MG tablet Take 1 tablet (25 mg total) by mouth every 6 (six) hours as needed for nausea or vomiting. 30 tablet 0    SUMAtriptan (IMITREX) 25 MG tablet Take 25 mg by mouth every 2 (two) hours as needed for migraine. May repeat in 2 hours if headache persists or recurs.      topiramate (TOPAMAX) 100 MG tablet Take 100 mg by  mouth daily.      topiramate (TOPAMAX) 25 MG tablet Take 25 mg by mouth at bedtime.       triamcinolone lotion (KENALOG) 0.1 % Apply 1 application topically daily as needed (rash).      valACYclovir (VALTREX) 1000 MG tablet Take 1 tablet (1,000 mg total) by mouth daily. 30 tablet 5    Scheduled:  heparin  5,000 Units Subcutaneous Q8H   pantoprazole (PROTONIX) IV  40 mg Intravenous Q12H   sodium chloride flush  3 mL Intravenous Q12H   Continuous:  acyclovir 655 mg (06/07/23 0444)   cefTRIAXone (ROCEPHIN)  IV 2 g (06/07/23 0824)   lactated ringers 125 mL/hr at 06/07/23 0419   vancomycin       ROS:                                                                                                                                       Headache is currently rated as 8/10, located at the top of her head, is non-throbbing and does not feel like one of her typical migraines. Pain was 7/10 on admission. Did have a fever prior to admission. Has been having intermittent unexplained fevers over approximately the past year. Does not endorse any weakness, trouble speaking, confusion, facial droop or vision changes. Other ROS as per HPI.    Blood pressure 119/83, pulse 88, temperature 98.4 F (36.9 C), temperature source Oral, resp. rate 18, height 5\' 4"  (1.626 m), weight 82 kg, SpO2 99 %.   General Examination:                                                                                                        Physical Exam HEENT- [place]/AT   Lungs- Respirations unlabored Extremities- Warm and well-perfused  Neurological Examination Mental Status: Awake and alert. Fully oriented. Thought content appropriate.  Speech fluent without evidence of aphasia.  Able to follow all commands without difficulty. Cranial Nerves: II: Temporal visual fields intact with no extinction to DSS. PERRL. III,IV, VI: No ptosis. EOMI. No nystagmus. V: Temp sensation subjectively decreased on the right VII: Smile symmetric VIII: Hearing intact to voice IX,X: No hypophonia or hoarseness XI: Symmetric XII: Midline tongue extension Motor: RUE: 5/5 LUE: 5/5 RLE: 5/5 LLE: 5/5 No pronator drift.  Sensory: Temp sensation subjectively decreased to RUE, normal to LUE and BLE. FT intact x 4 with no extinction to DSS. Deep Tendon Reflexes: 2+ and symmetric bilateral biceps, brachioradialis and patellae Cerebellar:  No ataxia with FNF bilaterally Gait: Deferred    Lab Results: Basic Metabolic Panel: Recent Labs  Lab 06/06/23 1407 06/07/23 0500  NA 137 141  K 4.0 3.3*  CL 107 109  CO2 20* 22  GLUCOSE 115* 101*  BUN 6 9  CREATININE 0.90 0.87  CALCIUM 9.6 8.2*    CBC: Recent Labs  Lab 06/06/23 1407 06/07/23 0500  WBC 10.5 9.4  HGB 14.3 11.1*  HCT 42.5 33.3*  MCV 90.6 90.0  PLT 283 224    Cardiac Enzymes: No results for input(s): "CKTOTAL", "CKMB", "CKMBINDEX", "TROPONINI" in the last 168 hours.  Lipid Panel: No results for input(s): "CHOL", "TRIG", "HDL", "CHOLHDL", "VLDL", "LDLCALC" in the last 168 hours.  Imaging: DG Chest 2 View  Result Date: 06/06/2023 CLINICAL DATA:  Cough, fever EXAM: CHEST - 2 VIEW COMPARISON:  None Available. FINDINGS: Lungs are clear. No pneumothorax or pleural effusion. Cardiac size within normal limits. Pulmonary vascularity is normal. Implanted loop recorder noted. No acute bone abnormality. IMPRESSION: No active  cardiopulmonary disease. Electronically Signed   By: Helyn Numbers M.D.   On: 06/06/2023 19:14   CT Head Wo Contrast  Result Date: 06/06/2023 CLINICAL DATA:  Neck pain with nausea, vomiting and fever. EXAM: CT HEAD WITHOUT CONTRAST TECHNIQUE: Contiguous axial images were obtained from the base of the skull through the vertex without intravenous contrast. RADIATION DOSE REDUCTION: This exam was performed according to the departmental dose-optimization program which includes automated exposure control, adjustment of the mA and/or kV according to patient size and/or use of iterative reconstruction technique. COMPARISON:  May 05, 2023 FINDINGS: Brain: No evidence of acute infarction, hemorrhage, hydrocephalus, extra-axial collection or mass lesion/mass effect. Vascular: No hyperdense vessel or unexpected calcification. Skull: Normal. Negative for fracture or focal lesion. Sinuses/Orbits: No acute finding. Other: None. IMPRESSION: Normal head CT. Electronically Signed   By: Aram Candela M.D.   On: 06/06/2023 18:05     Assessment: 47 y.o. 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  LP was attempted in the ED but was unsuccessful. She has been started on empiric ABX coverage. Has had recurrent unexplained fevers over approximately the past year, but is currently afebrile.  - Exam reveals mild subjective decreased sensation to temperature to right face and RUE. No other abnormalities noted.  - CT head: Normal.  - Extensive prior work up last month, including CTV, lupus panel and paraneoplastic panel was  negative, except for markedly elevated CSF white count which was without neutrophilic predominance, and elevated CSF protein.  - Labs: - No leukocytosis on CBC. - ESR elevated at 27 - D-dimer elevated at 1.05 - TSH normal - DDx for presentation with progressive headache in the setting of a prior diagnosis of aseptic meningitis includes unusual latent infectious causes with waxing/waning disease activity, autoimmune process and rare known side effect of IVIG. Of note, numerous viruses can cause aseptic meningitis and, although viral panels often come back negative, a viral etiology in a patient with otherwise negative testing can never be completely ruled out. Although most cases of aseptic meningitis are self-limiting, some cases can be due to treatable chronic infectious or inflammatory etiologies.   Recommendations: - Scheduled to  have image-guided LP today with IR.  - Serum West Nile virus testing has been ordered on serum as part of a arbovirus panel send-out - ID consult to determine if any other tests not already done on prior CSF sample from May can be ordered to assess for possible rarer viral, fungal or parasitic causes of aseptic meningitis - Adding VDRL to her pending CSF.  - Neurology will continue to follow  Addendum:  - Imaged guided LP today was unsuccessful. Patient states that IR staff told her that LP needle was "in the right place", but no fluid returned.  - IR is planning another attempt on Thursday, after overnight IVF - Adding cytology to her pending CSF (will need 10 cc)   Electronically signed: Dr. Caryl Pina 06/07/2023, 9:12 AM

## 2023-06-07 NOTE — Assessment & Plan Note (Addendum)
Seems stable, not flared. Patient reports RA affects hands and wrists. Currently off methotrexate. Supportive care / pain control PRN.

## 2023-06-07 NOTE — TOC Progression Note (Signed)
Transition of Care [facility]) - Progression Note    Patient Details  Name: [name] Horn MRN: [id number] Date of Birth: 1976-04-19  Transition of Care [facility]) CM/SW Contact  Marlowe Sax, RN Phone Number: 06/07/2023, 1:58 PM  Clinical Narrative:    Transition of Care [facility]) - Inpatient Brief Assessment   Patient Details  Name: [name] Horn MRN: [id number] Date of Birth: 1976/03/08  Transition of Care [facility]) CM/SW Contact:    Marlowe Sax, RN Phone Number: 06/07/2023, 1:58 PM   Clinical Narrative:  Patient comes from home She was admitted recently with viral meningitis, Attempted Lumbar puncture, unable to obtain They are going to hydrate and try again No identified TOC needs Transition of Care Asessment: Insurance and Status: Insurance coverage has been reviewed Patient has primary care physician: Yes ([facility]) Home environment has been reviewed: Home with husband Prior level of function:: independent Prior/Current Home Services: No current home services Social Determinants of Health Reivew: SDOH reviewed no interventions necessary Readmission risk has been reviewed: No (not high risk) Transition of care needs: no transition of care needs at this time         Expected Discharge Plan and Services                                               Social Determinants of Health (SDOH) Interventions SDOH Screenings   Food Insecurity: No Food Insecurity (06/07/2023)  Housing: Patient Declined (06/07/2023)  Transportation Needs: No Transportation Needs (06/07/2023)  Utilities: Not At Risk (06/07/2023)  Depression (PHQ2-9): Low Risk  (07/06/2021)  Tobacco Use: Medium Risk (06/07/2023)    Readmission Risk Interventions    11/13/2022   10:26 AM  Readmission Risk Prevention Plan  Transportation Screening Complete  PCP or Specialist Appt within 3-5 Days Complete  HRI or Home Care Consult Complete  Social Work Consult for Recovery Care  Planning/Counseling Complete  Palliative Care Screening Not Applicable  Medication Review [employer]) Complete

## 2023-06-07 NOTE — Hospital Course (Signed)
HPI on admission 06/06/23 by Dr. Renaldo Reel: "[name] Horn is a 47 y.o. female with medical history significant for Aseptic meningitis in April 2024 coming with headache and fever of 102.8. Patient has a history of antibody deficiency syndrome and was on monthly IVIG until last month when it was suspected to be the cause of her aseptic meningitis. Patient is followed by neurology at [facility] and also has a history of migraines.  Patient also reports that she has been having some neck pain. Patient's past medical history also consist of eosinophilic esophagitis, C. difficile colitis, rheumatoid arthritis, left leg DVT currently not on anticoagulation. Discussed with patient that we will review her chart and that her presentation may not necessarily be aseptic meningitis and it is reassuring that she does not have a white count and CBC is completely normal, patient has a low-grade temp of 99.3.   Patient reports headaches have been going on since about Sunday and the fevers are different than her typical migraine and that that she knows that this is not her typical migraine.  No weakness incontinence speech gait or chest pain or breathing trouble.  No melena no rashes.  Patient states that her RA affects her hands and wrists and there is no synovial thickening on exam.  Initial presentation heart rate of 116 temperature 99.3 respirations 18 blood pressure 121/87. CMP shows bicarb of 20 and glucose 115 otherwise normal, blood cultures collected, CSF labs ordered LP attempted in the emergency room and was not successful.  IR order placed to reattempt lumbar puncture under fluoroscopy guided radiology supervision.  Head CT done was negative for any acute findings."  Pt was admitted to the hospital, started on empiric antibiotics and antiviral therapies for possible meningitis.    Consults:  Neurology  Infectious Disease

## 2023-06-07 NOTE — Plan of Care (Signed)
Patient A&Ox4, from home, up with assist in room. Admitted for worsening headache over the last 3 days and recent hospital stay for meningitis. IR to perform LP 6/12 after unsuccessful attempt at bedside in ED last night. IV fluids and abx maintained.

## 2023-06-07 NOTE — Consult Note (Signed)
NAME: [name] Horn  DOB: 13-Dec-1976  MRN: [id number]  Date/Time: 06/07/2023 2:36 PM  REQUESTING PROVIDERDr.Griffith Subjective:  REASON FOR CONSULT: Aseptic meningiits ? [name] Horn is a 47 y.o. 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 She says documented temp at home was 102. She does not have any symptoms like cough or sob, no diarrghea. Had an episode of vomiting, no urinary symptoms, no rash, no joint swelling or pain.  Pt was recently in [facility] between 5/6-5/11 for head ache and fever. She had temp of 101 at home but in [facility] the Tmax was 100.5 once. She initially was on Tylenol which was DC. During that time she had LP and that showed 652 WBC ( 74% N) protein of 76 , blood glucose of 53  ME panel was negative for ( crypto, HSV, CMV, VZV, Enterovirus, HHV6, Listeria, ecoli, H influenza, human parechovirus, pneumococcus, meningococcus, GBS,) Bacterial culture and fungal culture of csf were negative blood  autoimmune workup  was negative Pt initially was on Triple antibiotics for a day or so and it was Dc)  Ct head, CTV, MRI were all negative- she was seen by neurologist as well So it was thought the aseptic meninigits was due to IVIG which she last received  on May 3rd with underlying migraine Paraneoplastic antibodies were sent in blood and they have come back neg as well After discharge she was fine for 2 weeks On 05/19/23 she saw  immunologist and c/o headache and was given a 5 day course of  prednisone and also fluconazole for yeast infection  Immunoglobulin was Normal IgG 588 - 1573 mg/dL 4,098  IgM 57 - 119 mg/dL 147  IgA 46 - 829 mg/dL 562    Z30 and Vitamin D were low  On 5/24 she also saw rheumatologist and plan was to start her on methtrexate for rheumatoid arthritis   As per [facility] note from 5/24 "she was evaluated by Dr. Wyline Mood (A/I) and completed a thorough initial investigation with normal immunoglobulin, CH50, respiratory burst, CD19, CD3, CD4, CD8, NK cells. Negative HIV, Hep B&C, normal tetanus and diptheria titers. 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 On 5/30 she called her Immunologist with fver and they consulted with ID and she was asked to go the [facility] ED. She did not and came to [facility] ED yesterday  06/06/23 c/o headache, neck pain and temp of 101. She had been taking tylenol and NSAID ( occasionally) Vitals in the ED  06/06/23 13:38  BP 121/87  Temp 99.3 F (37.4 C)  Pulse Rate 116 !  Resp 18  SpO2 96 %   Labs in the ED  Latest Reference Range & Units 06/06/23 14:07  WBC 4.0 - 10.5 K/uL 10.5  Hemoglobin 12.0 - 15.0 g/dL 86.5  HCT 78.4 - 69.6 % 42.5  Platelets 150 - 400 K/uL 283  Creatinine 0.44 - 1.00 mg/dL 2.95   CT head N CXR N Blood culture sent LP was attempted by EDP but failed Hopsitlaist started her on triple therapy - vanco. Ceftriaxone and Acyclovir  I  am asked to see her for aseptic meningitis. No travel No outdoor activity 5 cats at home No obvious bites Says 2 months ago she removed a small tick but was not attached to long No swimming, no use of netti pot No raw sea food/  Bad dentition She does not work  ID 09/28/2011 CSF - protein 56 Cell count N  C. 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   Herpes labialis 08/12/2020   IBD (inflammatory bowel disease)    Intertrigo 06/22/2020   Leukopenia 07/06/2021   Lymphedema    Malaise 12/30/2020   RA (rheumatoid arthritis) (HCC)    Rheumatoid arthritis (HCC) 08/12/2020   Transaminitis 08/12/2020   Uterine cancer (HCC)     Past Surgical History:  Procedure Laterality Date   APPENDECTOMY     cheek biopsy  11/2020   CHOLECYSTECTOMY     ESOPHAGOGASTRODUODENOSCOPY N/A 04/30/2017   Procedure: ESOPHAGOGASTRODUODENOSCOPY (EGD);  Surgeon: Dorena Cookey, MD;  Location: [facility] ENDOSCOPY;  Service: Endoscopy;  Laterality: N/A;   HERNIA REPAIR     x2   KNEE SURGERY     x3   MINIMALLY INVASIVE FORAMINOTOMY CERVICAL SPINE     C6-T1, Nitka   SAVORY DILATION N/A 04/30/2017   Procedure: SAVORY DILATION;  Surgeon: Dorena Cookey, MD;  Location: [facility] ENDOSCOPY;  Service: Endoscopy;  Laterality: N/A;   TONSILLECTOMY     TOTAL ABDOMINAL HYSTERECTOMY     Sarcoma, s/p XRT    Social History   Socioeconomic History   Marital status: Married    Spouse name: Not on file   Number of children: Not on file   Years of education: Not on file   Highest education level: Not on file  Occupational History   Not on file  Tobacco Use   Smoking status: Never    Passive exposure: Yes   Smokeless tobacco: Never  Vaping Use   Vaping Use: Never used  Substance and Sexual Activity   Alcohol use: Not Currently   Drug use: Yes    Comment: prescribed oxycodone   Sexual activity: Yes    Partners: Male    Birth control/protection: None  Other Topics Concern   Not on file  Social History Narrative   Not on file   Social Determinants of Health   Financial Resource Strain: Not on file  Food Insecurity: No Food Insecurity (06/07/2023)   Hunger Vital Sign    Worried About Running Out of  Food in the Last Year: Never true    Ran Out of Food in the Last Year: Never true  Transportation Needs: No Transportation Needs (06/07/2023)   PRAPARE - Administrator, Civil Service (Medical): No    Lack of Transportation (Non-Medical): No  Physical Activity: Not on file  Stress: Not on file  Social Connections: Not on file  Intimate Partner Violence: Not At Risk (06/07/2023)   Humiliation, Afraid, Rape, and Kick questionnaire    Fear of Current or Ex-Partner: No    Emotionally Abused: No    Physically Abused: No  Sexually Abused: No    Family History  Problem Relation Age of Onset   Hashimoto's thyroiditis Mother    Eczema Mother    Angioedema Mother    Colonic polyp Mother    Throat cancer Father    Arrhythmia Father    Angioedema Maternal Grandfather    Hypertension Other    Hyperlipidemia Other    Colon cancer Other        grandmother   Allergies  Allergen Reactions   Cyanoacrylate Hives   Other Itching and Rash    Dermabond surgical glue.  Dermabond surgical glue-blisters   Sulfa Antibiotics Anaphylaxis, Rash, Shortness Of Breath and Swelling    Angioedema (also)   Sulfonamide Derivatives Hives, Shortness Of Breath and Swelling    TONGUE SWELLS   Benadryl [Diphenhydramine Hcl] Other (See Comments)    Hyperactivity    Sulfamethoxazole     Other Reaction(s): Angioedema   Diphenhydramine    Diphenhydramine Hcl     Other Reaction(s): Other (See Comments)  HYPERACTIVITY, RESTLESS LEG   Silicone Rash    Watch band   I? Current Facility-Administered Medications  Medication Dose Route Frequency Provider Last Rate Last Admin   acetaminophen (TYLENOL) tablet 650 mg  650 mg Oral Q6H PRN Gertha Calkin, MD   650 mg at 06/07/23 1343   Or   acetaminophen (TYLENOL) suppository 650 mg  650 mg Rectal Q6H PRN Gertha Calkin, MD       acyclovir (ZOVIRAX) 655 mg in dextrose 5 % 100 mL IVPB  10 mg/kg (Adjusted) Intravenous Q8H Otelia Sergeant, RPH 113.1 mL/hr at  06/07/23 1344 655 mg at 06/07/23 1344   cefTRIAXone (ROCEPHIN) 2 g in sodium chloride 0.9 % 100 mL IVPB  2 g Intravenous Q12H Irena Cords V, MD 200 mL/hr at 06/07/23 0824 2 g at 06/07/23 0824   heparin injection 5,000 Units  5,000 Units Subcutaneous Q8H Gertha Calkin, MD   5,000 Units at 06/07/23 1343   lactated ringers infusion   Intravenous Continuous Esaw Grandchild A, DO   Stopped at 06/07/23 1344   morphine (PF) 2 MG/ML injection 2 mg  2 mg Intravenous Q4H PRN Irena Cords V, MD   2 mg at 06/07/23 1053   ondansetron (ZOFRAN) injection 4 mg  4 mg Intravenous Q6H PRN Esaw Grandchild A, DO   4 mg at 06/07/23 0827   Oral care mouth rinse  15 mL Mouth Rinse PRN Esaw Grandchild A, DO       pantoprazole (PROTONIX) injection 40 mg  40 mg Intravenous Q12H Irena Cords V, MD   40 mg at 06/07/23 0820   sodium chloride flush (NS) 0.9 % injection 3 mL  3 mL Intravenous Q12H Irena Cords V, MD   3 mL at 06/07/23 0947   vancomycin (VANCOCIN) IVPB 1000 mg/200 mL premix  1,000 mg Intravenous Q12H Barrie Folk, RPH         Abtx:  Anti-infectives (From admission, onward)    Start     Dose/Rate Route Frequency Ordered Stop   06/08/23 0600  vancomycin (VANCOREADY) IVPB 1750 mg/350 mL  Status:  Discontinued        1,750 mg 175 mL/hr over 120 Minutes Intravenous Every 24 hours 06/07/23 0142 06/07/23 0653   06/07/23 1800  vancomycin (VANCOCIN) IVPB 1000 mg/200 mL premix        1,000 mg 200 mL/hr over 60 Minutes Intravenous Every 12 hours 06/07/23 0654     06/07/23 0500  acyclovir (  ZOVIRAX) 655 mg in dextrose 5 % 100 mL IVPB        10 mg/kg  65.7 kg (Adjusted) 113.1 mL/hr over 60 Minutes Intravenous Every 8 hours 06/07/23 0107     06/07/23 0300  acyclovir (ZOVIRAX) 655 mg in dextrose 5 % 100 mL IVPB  Status:  Discontinued        10 mg/kg  65.7 kg (Adjusted) 113.1 mL/hr over 60 Minutes Intravenous Every 8 hours 06/07/23 0105 06/07/23 0107   06/07/23 0115  vancomycin (VANCOREADY) IVPB 1750 mg/350 mL         1,750 mg 175 mL/hr over 120 Minutes Intravenous  Once 06/07/23 0106 06/07/23 0800   06/07/23 0045  cefTRIAXone (ROCEPHIN) 2 g in sodium chloride 0.9 % 100 mL IVPB        2 g 200 mL/hr over 30 Minutes Intravenous Every 12 hours 06/07/23 0036         REVIEW OF SYSTEMS:  Const: + fever, negative chills, negative weight loss Eyes: negative diplopia or visual changes, negative eye pain ENT: negative coryza, negative sore throat Resp: negative cough, hemoptysis, dyspnea Cards: negative for chest pain, palpitations, lower extremity edema GU: negative for frequency, dysuria and hematuria GI: Negative for abdominal pain, diarrhea, bleeding, constipation Skin: negative for rash and pruritus Heme: negative for easy bruising and gum/nose bleeding MS: negative for myalgias, arthralgias, back pain and muscle weakness Neurolo:++ headaches, dizziness, no vertigo, memory problems  Psych:  anxiety, depression  Endocrine: negative for thyroid, diabetes Allergy/Immunology- as above Objective:  VITALS:  BP 104/65 (BP Location: Right Arm)   Pulse 89   Temp 98.1 F (36.7 C)   Resp 18   Ht 5\' 4"  (1.626 m)   Wt 82 kg   SpO2 97%   BMI 31.03 kg/m   PHYSICAL EXAM:  General: Alert, cooperative, no distress, pale Head: Normocephalic, without obvious abnormality, atraumatic. Eyes: Conjunctivae clear, anicteric sclerae. Pupils are equal ENT Nares normal. No drainage or sinus tenderness. Geographic tongue VS candida  Neck: able to touch chin to chest, symmetrical, no adenopathy, thyroid: non tender no carotid bruit and no JVD. Back: No CVA tenderness. Lungs: Clear to auscultation bilaterally. No Wheezing or Rhonchi. No rales. Heart: Regular rate and rhythm, no murmur, rub or gallop. Abdomen: Soft, non-tender,not distended. Bowel sounds normal. No masses Extremities: atraumatic, no cyanosis. No edema. No clubbing Skin: pigmentation over the feet     Lymph: Cervical, supraclavicular  normal. Neurologic: Grossly non-focal Pertinent Labs Lab Results CBC    Component Value Date/Time   WBC 9.4 06/07/2023 0500   RBC 3.70 (L) 06/07/2023 0500   HGB 11.1 (L) 06/07/2023 0500   HGB 11.5 04/02/2020 1545   HCT 33.3 (L) 06/07/2023 0500   HCT 35.2 04/02/2020 1545   PLT 224 06/07/2023 0500   PLT 279 04/02/2020 1545   MCV 90.0 06/07/2023 0500   MCV 96 04/02/2020 1545   MCH 30.0 06/07/2023 0500   MCHC 33.3 06/07/2023 0500   RDW 14.3 06/07/2023 0500   RDW 12.8 04/02/2020 1545   LYMPHSABS 1.3 05/01/2023 0610   LYMPHSABS 2.5 04/02/2020 1545   MONOABS 0.4 05/01/2023 0610   EOSABS 0.1 05/01/2023 0610   EOSABS 0.5 (H) 04/02/2020 1545   BASOSABS 0.0 05/01/2023 0610   BASOSABS 0.1 04/02/2020 1545       Latest Ref Rng & Units 06/07/2023    5:00 AM 06/06/2023    2:07 PM 05/03/2023    6:30 AM  CMP  Glucose 70 - 99  mg/dL 161  096  045   BUN 6 - 20 mg/dL 9  6  9    Creatinine 0.44 - 1.00 mg/dL 4.09  8.11  9.14   Sodium 135 - 145 mmol/L 141  137  144   Potassium 3.5 - 5.1 mmol/L 3.3  4.0  3.6   Chloride 98 - 111 mmol/L 109  107  115   CO2 22 - 32 mmol/L 22  20  22    Calcium 8.9 - 10.3 mg/dL 8.2  9.6  9.0   Total Protein 6.5 - 8.1 g/dL 6.1  7.6    Total Bilirubin 0.3 - 1.2 mg/dL 0.5  0.6    Alkaline Phos 38 - 126 U/L 80  93    AST 15 - 41 U/L 28  31    ALT 0 - 44 U/L 15  17        Microbiology: Recent Results (from the past 240 hour(s))  Culture, blood (routine x 2)     Status: None (Preliminary result)   Collection Time: 06/06/23  3:34 PM   Specimen: BLOOD  Result Value Ref Range Status   Specimen Description BLOOD BLOOD LEFT FOREARM  Final   Special Requests   Final    BOTTLES DRAWN AEROBIC AND ANAEROBIC Blood Culture adequate volume   Culture   Final    NO GROWTH < 24 HOURS Performed at [facility], [address], [place], [place] [address]    Report Status PENDING  Incomplete  Culture, blood (routine x 2)     Status: None (Preliminary result)    Collection Time: 06/06/23  3:39 PM   Specimen: BLOOD  Result Value Ref Range Status   Specimen Description BLOOD RIGHT ANTECUBITAL  Final   Special Requests   Final    BOTTLES DRAWN AEROBIC AND ANAEROBIC Blood Culture adequate volume   Culture   Final    NO GROWTH < 24 HOURS Performed at [facility], [address] [place], [place] [address]    Report Status PENDING  Incomplete    IMAGING RESULTS: CT head N CXR no infiltrate I have personally reviewed the films ? 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 deficiency for which she is followed at [facility] , recent aseptic meninigits with no viral or bacterial cause and thought to be due to IVIG infusion, negative Autoimmune and paraneoplastic blood work presents with headache and fever  Fever- There is no documentation in the hospital- last time Tmax once was 100.5 She need to keep a fever journal  She says she has had intermittent fever for many months to year Could she have one of the Autoinflammatory condition like TRAPS, FMF, though other features are lacking  Headache- she has migraine and recently had aseptic meningitis with Csf cell count of 700 cells with  70% N- negative meninigitis and encephalitis work up for a few virus and bacteria It may be interesting to repeat LP ( 50failed attempts) and see whether it is a resolving aseptic meningitis or worsening Few tests to check on csf other than common ME panel would be VDRL, TB, WNV , arbovirus paraneoplastic, sarcoid etc Should save CSF for further tests No evidence clinically of bacterial meningiits-DC vanco, Iv acyclovir and IV ceftriaxone and observe  Most of infectious condition does not cause intermittent fever , except occasionally relapsing fever due to borrelia As she has cats need to r/o bartonella WNV and other etiologies are unlikely  TB is remotely possible but Cxr  N    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 MEN ? Rheumatoid arthritis ( I cannot see any positive factor in the system)  Migraine  ?Pt takes adderall, dupixent Check to se ewhether meds contribute to her symptoms in any way ___________________________________________________ Discussed with patient, and neurologist Note:  This document was prepared using Dragon voice recognition software and may include unintentional dictation errors.

## 2023-06-07 NOTE — Progress Notes (Signed)
Pharmacy Antibiotic Note  [name] Horn is a 47 y.o. female admitted on 06/06/2023 with meningitis.  Pharmacy has been consulted for Vancomycin & Acyclovir dosing.  Plan: Change vancomycin to 1000 mg IV every 12 hours per nomogram targeting trough 15-20 Check vancomycin levels at steady state or as clinically indicated Continue acyclovir 10 mg/kg (ABW) q8hr per indication, BMI, and renal function. Ceftriaxone 2 grams IV every 12 per provider Monitor renal function and cultures for adjustments  Temp (24hrs), Avg:98.8 F (37.1 C), Min:97.8 F (36.6 C), Max:99.3 F (37.4 C)   Recent Labs  Lab 06/06/23 1407 06/06/23 1603 06/07/23 0500  WBC 10.5  --  9.4  CREATININE 0.90  --  0.87  LATICACIDVEN  --  1.2  --      Estimated Creatinine Clearance: 81.9 mL/min (by C-G formula based on SCr of 0.87 mg/dL).    Allergies  Allergen Reactions   Cyanoacrylate Hives   Other Itching and Rash    Dermabond surgical glue.  Dermabond surgical glue-blisters   Sulfa Antibiotics Anaphylaxis, Rash, Shortness Of Breath and Swelling    Angioedema (also)   Sulfonamide Derivatives Hives, Shortness Of Breath and Swelling    TONGUE SWELLS   Benadryl [Diphenhydramine Hcl] Other (See Comments)    Hyperactivity    Sulfamethoxazole     Other Reaction(s): Angioedema   Diphenhydramine    Diphenhydramine Hcl     Other Reaction(s): Other (See Comments)  HYPERACTIVITY, RESTLESS LEG   Silicone Rash    Watch band    Antimicrobials this admission: 6/12 Ceftriaxone >>  6/12 Vancomycin >>  6/12 Acyclovir >>   Microbiology results: 6/11 BCx: Pending 6/11 CSFCx: Pending  Thank you for allowing pharmacy to be a part of this patient's care.  Barrie Folk, PharmD 06/07/2023 6:54 AM

## 2023-06-07 NOTE — Progress Notes (Signed)
Brief Interventional Radiology Note:  Patient was transferred to radiology for fluoroscopic guided lumbar puncture.  LP performed at L4-L5 level x 2 with no return of CSF.  Dr. Allena Katz, radiology, attempted LP at L4-L5 confirming placement with x-ray but with no return of CSF. Dr. Allena Katz recommends IV hydration today and overnight with reattempt of LP in the morning. Patient agrees with plan. Care team made aware.   Alex Gardener, AGNP-BC 06/07/2023, 12:11 PM

## 2023-06-08 ENCOUNTER — Inpatient Hospital Stay: Payer: BC Managed Care – PPO

## 2023-06-08 DIAGNOSIS — G43909 Migraine, unspecified, not intractable, without status migrainosus: Secondary | ICD-10-CM | POA: Diagnosis not present

## 2023-06-08 DIAGNOSIS — G030 Nonpyogenic meningitis: Secondary | ICD-10-CM | POA: Diagnosis not present

## 2023-06-08 DIAGNOSIS — D806 Antibody deficiency with near-normal immunoglobulins or with hyperimmunoglobulinemia: Secondary | ICD-10-CM | POA: Diagnosis not present

## 2023-06-08 DIAGNOSIS — R509 Fever, unspecified: Secondary | ICD-10-CM | POA: Diagnosis not present

## 2023-06-08 LAB — MAGNESIUM: Magnesium: 1.8 mg/dL (ref 1.7–2.4)

## 2023-06-08 LAB — BASIC METABOLIC PANEL
Anion gap: 8 (ref 5–15)
BUN: 9 mg/dL (ref 6–20)
CO2: 25 mmol/L (ref 22–32)
Calcium: 8.8 mg/dL — ABNORMAL LOW (ref 8.9–10.3)
Chloride: 107 mmol/L (ref 98–111)
Creatinine, Ser: 0.97 mg/dL (ref 0.44–1.00)
GFR, Estimated: >60 mL/min (ref >60)
Glucose, Bld: 91 mg/dL (ref 70–99)
Potassium: 3.3 mmol/L — ABNORMAL LOW (ref 3.5–5.1)
Sodium: 140 mmol/L (ref 135–145)

## 2023-06-08 LAB — FERRITIN: Ferritin: 44 ng/mL (ref 11–307)

## 2023-06-08 LAB — CORTISOL-AM, BLOOD: Cortisol - AM: 2.6 ug/dL — ABNORMAL LOW (ref 6.7–22.6)

## 2023-06-08 MED ORDER — BACLOFEN 10 MG PO TABS
10.0000 mg | ORAL_TABLET | Freq: Every day | ORAL | Status: DC
  Administered 2023-06-08: 10 mg via ORAL
  Filled 2023-06-08: qty 1

## 2023-06-08 MED ORDER — CYCLOBENZAPRINE HCL 10 MG PO TABS: 10.0000 mg | ORAL_TABLET | Freq: Every day | ORAL | Status: DC | PRN

## 2023-06-08 MED ORDER — POTASSIUM CHLORIDE CRYS ER 20 MEQ PO TBCR
40.0000 meq | EXTENDED_RELEASE_TABLET | ORAL | Status: AC
  Administered 2023-06-08 (×2): 40 meq via ORAL
  Filled 2023-06-08 (×2): qty 2

## 2023-06-08 MED ORDER — PROMETHAZINE HCL 25 MG PO TABS: 25.0000 mg | ORAL_TABLET | Freq: Four times a day (QID) | ORAL | Status: DC | PRN

## 2023-06-08 MED ORDER — ALBUTEROL SULFATE (2.5 MG/3ML) 0.083% IN NEBU
2.5000 mg | INHALATION_SOLUTION | RESPIRATORY_TRACT | Status: DC | PRN
  Administered 2023-06-09: 2.5 mg via RESPIRATORY_TRACT
  Filled 2023-06-08: qty 3

## 2023-06-08 MED ORDER — PROCHLORPERAZINE EDISYLATE 10 MG/2ML IJ SOLN
10.0000 mg | Freq: Four times a day (QID) | INTRAMUSCULAR | Status: DC | PRN
  Administered 2023-06-09 (×2): 10 mg via INTRAVENOUS
  Filled 2023-06-08 (×3): qty 2

## 2023-06-08 MED ORDER — NYSTATIN 100000 UNIT/ML MT SUSP
10.0000 mL | Freq: Three times a day (TID) | OROMUCOSAL | Status: DC
  Administered 2023-06-08 – 2023-06-09 (×3): 1000000 [IU] via ORAL
  Filled 2023-06-08 (×4): qty 10

## 2023-06-08 MED ORDER — OLOPATADINE HCL 0.1 % OP SOLN
1.0000 [drp] | Freq: Two times a day (BID) | OPHTHALMIC | Status: DC
  Administered 2023-06-08 – 2023-06-09 (×3): 1 [drp] via OPHTHALMIC
  Filled 2023-06-08: qty 5

## 2023-06-08 MED ORDER — FLUCONAZOLE 100 MG PO TABS
200.0000 mg | ORAL_TABLET | Freq: Every day | ORAL | Status: DC
  Administered 2023-06-08 – 2023-06-09 (×2): 200 mg via ORAL
  Filled 2023-06-08 (×2): qty 2

## 2023-06-08 MED ORDER — CYCLOSPORINE 0.05 % OP EMUL
1.0000 [drp] | Freq: Two times a day (BID) | OPHTHALMIC | Status: DC
  Administered 2023-06-08 – 2023-06-09 (×3): 1 [drp] via OPHTHALMIC
  Filled 2023-06-08 (×3): qty 30

## 2023-06-08 MED ORDER — LORAZEPAM 1 MG PO TABS
1.0000 mg | ORAL_TABLET | Freq: Three times a day (TID) | ORAL | Status: DC | PRN
  Administered 2023-06-08 – 2023-06-09 (×3): 1 mg via ORAL
  Filled 2023-06-08 (×3): qty 1

## 2023-06-08 MED ORDER — SUMATRIPTAN SUCCINATE 50 MG PO TABS
25.0000 mg | ORAL_TABLET | ORAL | Status: DC | PRN
  Administered 2023-06-08 – 2023-06-09 (×2): 25 mg via ORAL
  Filled 2023-06-08 (×3): qty 1

## 2023-06-08 MED ORDER — PRAVASTATIN SODIUM 20 MG PO TABS
40.0000 mg | ORAL_TABLET | Freq: Every day | ORAL | Status: DC
  Administered 2023-06-08: 40 mg via ORAL
  Filled 2023-06-08: qty 2

## 2023-06-08 MED ORDER — MOMETASONE FURO-FORMOTEROL FUM 100-5 MCG/ACT IN AERO
2.0000 | INHALATION_SPRAY | Freq: Two times a day (BID) | RESPIRATORY_TRACT | Status: DC
  Administered 2023-06-08 – 2023-06-09 (×3): 2 via RESPIRATORY_TRACT
  Filled 2023-06-08: qty 8.8

## 2023-06-08 MED ORDER — FAMOTIDINE 20 MG PO TABS
40.0000 mg | ORAL_TABLET | Freq: Every day | ORAL | Status: DC
  Administered 2023-06-08: 40 mg via ORAL
  Filled 2023-06-08: qty 2

## 2023-06-08 MED ORDER — COSYNTROPIN 0.25 MG IJ SOLR
0.2500 mg | Freq: Once | INTRAMUSCULAR | Status: AC
  Administered 2023-06-09: 0.25 mg via INTRAVENOUS
  Filled 2023-06-08: qty 0.25

## 2023-06-08 MED ORDER — DICYCLOMINE HCL 10 MG PO CAPS: 10.0000 mg | ORAL_CAPSULE | Freq: Every day | ORAL | Status: DC | PRN

## 2023-06-08 MED ORDER — TOPIRAMATE 100 MG PO TABS
100.0000 mg | ORAL_TABLET | Freq: Every day | ORAL | Status: DC
  Administered 2023-06-08 – 2023-06-09 (×2): 100 mg via ORAL
  Filled 2023-06-08 (×2): qty 1

## 2023-06-08 MED ORDER — PANTOPRAZOLE SODIUM 40 MG PO TBEC
40.0000 mg | DELAYED_RELEASE_TABLET | Freq: Two times a day (BID) | ORAL | Status: DC
  Administered 2023-06-08 – 2023-06-09 (×2): 40 mg via ORAL
  Filled 2023-06-08 (×2): qty 1

## 2023-06-08 MED ORDER — VALACYCLOVIR HCL 500 MG PO TABS
1000.0000 mg | ORAL_TABLET | Freq: Every day | ORAL | Status: DC
  Administered 2023-06-08 – 2023-06-09 (×2): 1000 mg via ORAL
  Filled 2023-06-08 (×2): qty 2

## 2023-06-08 MED ORDER — LIDOCAINE HCL (PF) 1 % IJ SOLN
10.0000 mL | Freq: Once | INTRAMUSCULAR | Status: AC
  Administered 2023-06-08: 10 mL
  Filled 2023-06-08: qty 10

## 2023-06-08 MED ORDER — ACETAMINOPHEN 325 MG PO TABS: 650.0000 mg | ORAL_TABLET | ORAL | Status: DC | PRN

## 2023-06-08 MED ORDER — TOPIRAMATE 25 MG PO TABS
25.0000 mg | ORAL_TABLET | Freq: Every day | ORAL | Status: DC
  Administered 2023-06-08: 25 mg via ORAL
  Filled 2023-06-08: qty 1

## 2023-06-08 NOTE — Progress Notes (Signed)
Progress Note   Patient: [name] Horn DOB: 07/12/1976 DOA: 06/06/2023     1 DOS: the patient was seen and examined on 06/08/2023   Brief hospital course: HPI on admission 06/06/23 by Dr. Renaldo Reel: "[name] Horn is a 47 y.o. female with medical history significant for Aseptic meningitis in April 2024 coming with headache and fever of 102.8. Patient has a history of antibody deficiency syndrome and was on monthly IVIG until last month when it was suspected to be the cause of her aseptic meningitis. Patient is followed by neurology at [facility] and also has a history of migraines.  Patient also reports that she has been having some neck pain. Patient's past medical history also consist of eosinophilic esophagitis, C. difficile colitis, rheumatoid arthritis, left leg DVT currently not on anticoagulation. Discussed with patient that we will review her chart and that her presentation may not necessarily be aseptic meningitis and it is reassuring that she does not have a white count and CBC is completely normal, patient has a low-grade temp of 99.3.   Patient reports headaches have been going on since about Sunday and the fevers are different than her typical migraine and that that she knows that this is not her typical migraine.  No weakness incontinence speech gait or chest pain or breathing trouble.  No melena no rashes.  Patient states that her RA affects her hands and wrists and there is no synovial thickening on exam.  Initial presentation heart rate of 116 temperature 99.3 respirations 18 blood pressure 121/87. CMP shows bicarb of 20 and glucose 115 otherwise normal, blood cultures collected, CSF labs ordered LP attempted in the emergency room and was not successful.  IR order placed to reattempt lumbar puncture under fluoroscopy guided radiology supervision.  Head CT done was negative for any acute findings."  Pt was admitted to the hospital, started on empiric antibiotics and antiviral  therapies for possible meningitis.    Consults:  Neurology  Infectious Disease    Assessment and Plan: * Headache Unlike her typical migraines. Differentials include rebound headache, NSAID associated headache, ENT related headache - ETD/ SINUS, reflux related headache, migraine, recurrent aseptic meningitis. Head CT is negative, blood work reassuring for any infection related headache.  No leukocytosis. Pt did report fevers at home Tmax 102.   LP unsuccessful in the ED. 6/12 -- IR attempted LP today, also unsuccessful --Started on empiric meningitis treatment for now -- IV Vanc, Rocephin, Acyclovir -- stopped 6/12 per ID --IR to re-attempt LP today --Continue IV fluids --Neurology and Infectious disease consulted - appreciate input --Avoid meloxicam / NSAIDS --D/C tylenol to monitor for fevers   Antibody deficiency syndrome (HCC) Currently patient is off IVIG therapy. Followed by [facility].   RA (rheumatoid arthritis) (HCC) Seems stable, not flared. Patient reports RA affects hands and wrists. Currently off methotrexate. Supportive care / pain control PRN.  Migraine Resume home Topamax, PRN Imitrex. Evaluation of headache as outlined.  Hypokalemia K 3.3 this AM, oral replacement ordered. Monitor BMP. Check Mg level.  Rheumatoid arthritis (HCC) See RA   Radiculopathy of cervical region Consider MRI c-spine if neck pain persist or worsens.  No weakness / no thenar atrophy.    Low Cortisol ACTH stim test ordered for tomorrow AM to evaluate further      Subjective: Pt awake resting in bed when seen this AM.  She reports persistent headache.  We went through her home medication list & resumed her Topamax and Imitrex,  among others.  No fever/chills.  Explained holding off Tylenol to not mask any possible fevers.  Pt denies other acute complaints.     Physical Exam: Vitals:   06/07/23 1546 06/07/23 2333 06/08/23 0439 06/08/23 0800  BP: 104/64 109/72   108/88  Pulse: 97 98  80  Resp: 19 18  16   Temp: 98.6 F (37 C) 98.6 F (37 C)  97.9 F (36.6 C)  TempSrc:  Oral  Oral  SpO2: 99% 98%  99%  Weight:   84.5 kg   Height:       General exam: awake, alert, no acute distress HEENT: moist mucus membranes, hearing grossly normal  Respiratory system: on room air, normal respiratory effort. Cardiovascular system: RRR, no pedal edema.   Central nervous system: A&O x3. no gross focal neurologic deficits, normal speech Extremities: moves all, no edema, normal tone Skin: dry, intact, normal temperature Psychiatry: normal mood, congruent affect, judgement and insight appear normal   Data Reviewed:  Notable labs --- K 3.3, Ca 8.8, normal ferritin 44    AM cortisol Low 2.6   Micro - blood cultures pending - no growth to date   Family Communication: None. Pt is able to update.  Disposition: Status is: Inpatient Remains inpatient appropriate because: ongoing evaluation of persistent headache.   Planned Discharge Destination: Home    Time spent: 42 minutes  Author: Pennie Banter, DO 06/08/2023 3:37 PM  For on call review [email or web address].

## 2023-06-08 NOTE — Plan of Care (Signed)

## 2023-06-08 NOTE — Assessment & Plan Note (Signed)
Resume home Topamax, PRN Imitrex. Evaluation of headache as outlined.

## 2023-06-08 NOTE — Procedures (Signed)
Unsuccessful fluoro guided LP at L4-L5 level by Mina Marble, PA-C. An additional unsuccessful fluoro guided LP at L4-L% level was attempted by Dr Jackey Loge. On both attempts, the spinal canal was successfully accessed but there was no return of CSF. Recommend continued IV hydration. Care team made aware.  No immediate post procedural complication.  Please see imaging section of Epic for full dictation.    Mina Marble, PA-C 06/08/2023, 3:24 PM

## 2023-06-08 NOTE — Plan of Care (Signed)
LP attempt under flouro was again unsuccessful. The spinal canal was successfully accessed but there was no return of CSF.   Regarding treatment, unfortunately, per literature search, given that there are multiple potential infectious causes of aseptic meningitis, steroids should not be used given their inhibitory effects on host immune response. The exception for this would be if there is elevated intracranial pressure. As her symptoms and signs are not consistent with elevated ICP, would hold off on steroids.   She will need close follow up outpatient, about q4 weeks, to make sure that her symptoms are not worsening. At some point another attempt should be made at obtaining CSF. A cervical tap could be attempted by Neurosurgery, but risks of such may outweigh potential benefits, unless she clinically worsens.   As IVIG can cause aseptic meningitis, along with other medications including OKT3, NSAIDs and some antibiotics, would be cautious about restarting IVIG and would avoid NSAIDs for management of her headache.   Can try Compazine 10 mg IV x 1. If there is improvement, then a migraine component to her headache pain would be more likely.   Electronically signed: Dr. Caryl Pina

## 2023-06-08 NOTE — Progress Notes (Signed)
Date of Admission:  06/06/2023    ID: [name] Horn is a 47 y.o. female Principal Problem:   Headache Active Problems:   Radiculopathy of cervical region   Rheumatoid arthritis (HCC)   Fever   Antibody deficiency syndrome (HCC)   RA (rheumatoid arthritis) (HCC)   Migraine   Hypokalemia   Aseptic meningitis   Had another LP attempt -failed Subjective: Says headache is better   Medications:   baclofen  10 mg Oral QHS   [START ON 06/09/2023] cosyntropin  0.25 mg Intravenous Once   cycloSPORINE  1 drop Both Eyes BID   famotidine  40 mg Oral QHS   fluconazole  200 mg Oral Daily   heparin  5,000 Units Subcutaneous Q8H   mometasone-formoterol  2 puff Inhalation BID   nystatin  10 mL Oral TID   olopatadine  1 drop Both Eyes BID   pantoprazole  40 mg Oral BID   pravastatin  40 mg Oral QHS   sodium chloride flush  3 mL Intravenous Q12H   topiramate  100 mg Oral Daily   topiramate  25 mg Oral QHS   valACYclovir  1,000 mg Oral Daily    Objective: Vital signs in last 24 hours: Patient Vitals for the past 24 hrs:  BP Temp Temp src Pulse Resp SpO2 Weight  06/08/23 1615 108/89 98.6 F (37 C) Oral 99 18 99 % --  06/08/23 0800 108/88 97.9 F (36.6 C) Oral 80 16 99 % --  06/08/23 0439 -- -- -- -- -- -- 84.5 kg  06/07/23 2333 109/72 98.6 F (37 C) Oral 98 18 98 % --      PHYSICAL EXAM:  General: Alert, cooperative, no distress, appears stated age.  Head: Normocephalic, without obvious abnormality, atraumatic. Eyes: Conjunctivae clear, anicteric sclerae. Pupils are equal ENT Nares normal. No drainage or sinus tenderness. Poor dentition Neck: Supple, symmetrical, no adenopathy, thyroid: non tender no carotid bruit and no JVD. Back: LP site covered with band aid Lungs: Clear to auscultation bilaterally. No Wheezing or Rhonchi. No rales. Heart: Regular rate and rhythm, no murmur, rub or gallop. Abdomen: Soft, non-tender,not distended. Bowel sounds normal. No  masses Extremities: atraumatic, no cyanosis. No edema. No clubbing Skin: No rashes or lesions. Or bruising Lymph: Cervical, supraclavicular normal. Neurologic: Grossly non-focal  Lab Results    Latest Ref Rng & Units 06/07/2023    5:00 AM 06/06/2023    2:07 PM 05/03/2023    6:30 AM  CBC  WBC 4.0 - 10.5 K/uL 9.4  10.5  8.0   Hemoglobin 12.0 - 15.0 g/dL 16.1  09.6  04.5   Hematocrit 36.0 - 46.0 % 33.3  42.5  36.7   Platelets 150 - 400 K/uL 224  283  214        Latest Ref Rng & Units 06/08/2023    6:33 AM 06/07/2023    5:00 AM 06/06/2023    2:07 PM  CMP  Glucose 70 - 99 mg/dL 91  409  811   BUN 6 - 20 mg/dL 9  9  6    Creatinine 0.44 - 1.00 mg/dL 9.14  7.82  9.56   Sodium 135 - 145 mmol/L 140  141  137   Potassium 3.5 - 5.1 mmol/L 3.3  3.3  4.0   Chloride 98 - 111 mmol/L 107  109  107   CO2 22 - 32 mmol/L 25  22  20    Calcium 8.9 - 10.3 mg/dL 8.8  8.2  9.6   Total Protein 6.5 - 8.1 g/dL  6.1  7.6   Total Bilirubin 0.3 - 1.2 mg/dL  0.5  0.6   Alkaline Phos 38 - 126 U/L  80  93   AST 15 - 41 U/L  28  31   ALT 0 - 44 U/L  15  17       Microbiology: BC- NG Studies/Results: DG FL GUIDED LUMBAR PUNCTURE  Result Date: 06/08/2023 CLINICAL DATA:  Provided history: Headache. Additional history: Concern for meningitis. Request received for fluoroscopically-guided lumbar puncture. Multiple recent prior unsuccessful lumbar punctures. EXAM: LUMBAR PUNCTURE UNDER FLUOROSCOPY FLUOROSCOPY: Radiation Exposure Index (as provided by the fluoroscopic device): 12.30 mGy Kerma COMPARISON:  Images from fluoroscopically-guided lumbar puncture 06/07/2023. PROCEDURE: Mina Marble, PA-C obtained informed consent from the patient prior to the procedure. This process included a discussion of procedure risks. An appropriate skin entry site was determined under fluoroscopy and marked. The lower back was prepped with Betadine and draped in the usual sterile fashion. Local anesthesia was provided with 1% lidocaine.  Initially, Mina Marble, PA-C performed a lumbar puncture under fluoroscopic guidance at the L4-L5 level (to the right of midline). A small amount of CSF was transiently visualized within the needle hub. However, no CSF could be collected. Subsequently, Dr. Jackey Loge performed a lumbar puncture under fluoroscopic guidance at the L4-L5 level (to the left of midline). However, there was no CSF return. An additional attempt was made at the same site, again without CSF return. At this point, the patient elected to terminate the procedure. The inner stylet was replaced within the needle, and the needle was removed in its entirety. No immediate post-procedure complication was apparent. Procedure performed by Mina Marble, PA-C and Dr Jackey Loge. IMPRESSION: 1. Unsuccessful fluoroscopically-guided L4-L5 lumbar punctures, as described. 2. No immediate post-procedure complication. Electronically Signed   By: Jackey Loge D.O.   On: 06/08/2023 16:03   DG FL GUIDED LUMBAR PUNCTURE  Result Date: 06/07/2023 CLINICAL DATA:  47 year old female admitted with complaint of headache and stiff neck suspicion of meningitis. Patient diagnosed with meningitis proximally 1 month ago patient was referred for fluoroscopic guided lumbar puncture. EXAM: LUMBAR PUNCTURE UNDER FLUOROSCOPY PROCEDURE: An appropriate skin entry site was determined fluoroscopically. Operator donned sterile gloves and mask. Skin site was marked, then prepped with Betadine, draped in usual sterile fashion, and infiltrated locally with 1% lidocaine. A 20 gauge 3-1/2 inch spinal needle advanced into the thecal sac at L4-L5 from a left and right interlaminar approach with no return of CSF. Procedure was attempted by Dr. Allena Katz with 22 gauge 5 inch spinal needle at the L4-L5 level with no return of CSF. The needle was then removed. The patient tolerated the procedure well and there were no complications. FLUOROSCOPY: Radiation Exposure Index (as provided by the  fluoroscopic device): 31.80 mGy Kerma IMPRESSION: Lumbar puncture attempted at L4-L5 level without return of CSF from suspected dehydration. Recommend IV hydration with another lumbar puncture attempt 06/08/2023. This exam was performed by Alex Gardener, NP, and was supervised and interpreted by Elige Ko, MD. Electronically Signed   By: Elige Ko M.D.   On: 06/07/2023 16:33   DG Chest 2 View  Result Date: 06/06/2023 CLINICAL DATA:  Cough, fever EXAM: CHEST - 2 VIEW COMPARISON:  None Available. FINDINGS: Lungs are clear. No pneumothorax or pleural effusion. Cardiac size within normal limits. Pulmonary vascularity is normal. Implanted loop recorder noted. No acute bone abnormality. IMPRESSION: No active cardiopulmonary disease. Electronically Signed  By: Helyn Numbers M.D.   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 [facility] , recent aseptic meninigits with no viral or bacterial cause and thought to be due to IVIG infusion, negative Autoimmune and paraneoplastic blood work presents with headache and fever   Fever- There is no documentation in the hospital- last time she had a documented fever was in May Tmax once was 100.5 She needs to keep a fever journal  She says she has had intermittent fever for many months to year Could she have one of the Autoinflammatory condition like TRAPS, FMF, though other features are lacking   Headache- she has migraine and recently had aseptic meningitis with Csf cell count of 700 cells with  70% N- negative meninigitis and encephalitis work up for a few virus and bacteria Would not attempt another LP ( failed 5) . No urgency - She can go to [facility]  as all her providers are there and get it there if needed Few tests to check on csf other than common ME panel would be VDRL, TB, WNV , arbovirus paraneoplastic, sarcoid  etc Should save CSF for further tests No evidence clinically of bacterial meningiits-DC vanco, Iv acyclovir and IV ceftriaxone on 05/08/23 evening   Most of infectious condition does not cause intermittent fever , except occasionally relapsing fever due to borrelia As she has cats need to r/o bartonella WNV and other etiologies are unlikely TB is remotely possible but Cxr N       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 MEN ? Rheumatoid arthritis ( I cannot see any positive factor in the system)   Migraine   ?Discussed the management with the patient and care team

## 2023-06-09 DIAGNOSIS — M05719 Rheumatoid arthritis with rheumatoid factor of unspecified shoulder without organ or systems involvement: Secondary | ICD-10-CM

## 2023-06-09 DIAGNOSIS — G030 Nonpyogenic meningitis: Secondary | ICD-10-CM | POA: Diagnosis not present

## 2023-06-09 DIAGNOSIS — R519 Headache, unspecified: Secondary | ICD-10-CM | POA: Diagnosis not present

## 2023-06-09 DIAGNOSIS — G43909 Migraine, unspecified, not intractable, without status migrainosus: Secondary | ICD-10-CM | POA: Diagnosis not present

## 2023-06-09 LAB — ACTH STIMULATION, 3 TIME POINTS
Cortisol, 30 Min: 14.3 ug/dL
Cortisol, 60 Min: 18 ug/dL
Cortisol, Base: 1.3 ug/dL

## 2023-06-09 LAB — RHEUMATOID FACTOR: Rheumatoid fact SerPl-aCnc: 10.1 IU/mL (ref <14.0)

## 2023-06-09 LAB — PTH, INTACT AND CALCIUM
Calcium, Total (PTH): 8.4 mg/dL — ABNORMAL LOW (ref 8.7–10.2)
PTH: 28 pg/mL (ref 15–65)

## 2023-06-09 LAB — BARTONELLA ANTIBODY PANEL
B Quintana IgM: NEGATIVE titer
B henselae IgG: NEGATIVE titer
B henselae IgM: NEGATIVE titer
B quintana IgG: NEGATIVE titer

## 2023-06-09 LAB — BASIC METABOLIC PANEL
Anion gap: 10 (ref 5–15)
BUN: 8 mg/dL (ref 6–20)
CO2: 23 mmol/L (ref 22–32)
Calcium: 9.4 mg/dL (ref 8.9–10.3)
Chloride: 107 mmol/L (ref 98–111)
Creatinine, Ser: 0.87 mg/dL (ref 0.44–1.00)
GFR, Estimated: >60 mL/min (ref >60)
Glucose, Bld: 128 mg/dL — ABNORMAL HIGH (ref 70–99)
Potassium: 3.7 mmol/L (ref 3.5–5.1)
Sodium: 140 mmol/L (ref 135–145)

## 2023-06-09 LAB — MISC LABCORP TEST (SEND OUT): Labcorp test code: 138842

## 2023-06-09 LAB — ANGIOTENSIN CONVERTING ENZYME: Angiotensin-Converting Enzyme: 42 U/L (ref 14–82)

## 2023-06-09 LAB — TOXOPLASMA ANTIBODIES- IGG AND  IGM
Toxoplasma Antibody- IgM: <3 AU/mL (ref 0.0–7.9)
Toxoplasma IgG Ratio: 3.3 IU/mL (ref 0.0–7.1)

## 2023-06-09 LAB — CRYPTOCOCCUS ANTIGEN, SERUM: Cryptococcus Antigen, Serum: NEGATIVE

## 2023-06-09 LAB — LYME DISEASE SEROLOGY W/REFLEX: Lyme Total Antibody EIA: NEGATIVE

## 2023-06-09 MED ORDER — PROCHLORPERAZINE MALEATE 10 MG PO TABS: 10.0000 mg | ORAL_TABLET | Freq: Four times a day (QID) | ORAL | 0 refills | Status: DC | PRN

## 2023-06-09 NOTE — Discharge Summary (Signed)
Physician Discharge Summary   Patient: [name] Horn MRN: [id number] DOB: 1976-05-17  Admit date:     06/06/2023  Discharge date: 06/09/2023  Discharge Physician: Pennie Banter   PCP: [facility]   Recommendations at discharge:  {Tip this will not be part of the note when signed- Example include specific recommendations for outpatient follow-up, pending tests to follow-up on. (Optional):26781} Follow up at [facility] with Immunology and other specialists Recommend going directly to [facility] ER if needing emergent evaluation of headaches, fevers Follow up with Primary Care in 1-2 weeks Follow up pending labs ACTH & stim test, [facility] send-outs, rheumatoid factor, Quantiferon, Cryptococcus Ag, Blastomyces Ag, Histoplasma urine Ag Repeat CBC, CMP, Mg in 1-2 weeks   Discharge Diagnoses: Principal Problem:   Headache Active Problems:   Aseptic meningitis   Antibody deficiency syndrome (HCC)   RA (rheumatoid arthritis) (HCC)   Migraine   Hypokalemia   Radiculopathy of cervical region   Rheumatoid arthritis (HCC)   Fever  Resolved Problems:   * No resolved hospital problems. [facility] Course: HPI on admission 06/06/23 by Dr. Renaldo Reel: "[name] Horn is a 47 y.o. female with medical history significant for Aseptic meningitis in April 2024 coming with headache and fever of 102.8. Patient has a history of antibody deficiency syndrome and was on monthly IVIG until last month when it was suspected to be the cause of her aseptic meningitis. Patient is followed by neurology at [facility] and also has a history of migraines.  Patient also reports that she has been having some neck pain. Patient's past medical history also consist of eosinophilic esophagitis, C. difficile colitis, rheumatoid arthritis, left leg DVT currently not on anticoagulation. Discussed with patient that we will review her chart and that her presentation may not necessarily be aseptic meningitis and it is reassuring that  she does not have a white count and CBC is completely normal, patient has a low-grade temp of 99.3.   Patient reports headaches have been going on since about Sunday and the fevers are different than her typical migraine and that that she knows that this is not her typical migraine.  No weakness incontinence speech gait or chest pain or breathing trouble.  No melena no rashes.  Patient states that her RA affects her hands and wrists and there is no synovial thickening on exam.  Initial presentation heart rate of 116 temperature 99.3 respirations 18 blood pressure 121/87. CMP shows bicarb of 20 and glucose 115 otherwise normal, blood cultures collected, CSF labs ordered LP attempted in the emergency room and was not successful.  IR order placed to reattempt lumbar puncture under fluoroscopy guided radiology supervision.  Head CT done was negative for any acute findings."  Pt was admitted to the hospital, started on empiric antibiotics and antiviral therapies for possible meningitis.    Consults:  Neurology  Infectious Disease   Assessment and Plan: * Headache Unlike her typical migraines. Differentials include rebound headache, NSAID associated headache, ENT related headache - ETD/ SINUS, reflux related headache, migraine, recurrent aseptic meningitis. Head CT is negative, blood work reassuring for any infection related headache.  No leukocytosis. Pt did report fevers at home Tmax 102.   LP unsuccessful in the ED. 6/12 -- IR attempted LP today, also unsuccessful --Started on empiric meningitis treatment for now -- IV Vanc, Rocephin, Acyclovir -- stopped 6/12 per ID --IR to re-attempt LP today --Continue IV fluids --Neurology and Infectious disease consulted - appreciate input --Avoid meloxicam /  NSAIDS --D/C tylenol to monitor for fevers   Antibody deficiency syndrome (HCC) Currently patient is off IVIG therapy. Followed by [facility].   RA (rheumatoid arthritis) (HCC) Seems  stable, not flared. Patient reports RA affects hands and wrists. Currently off methotrexate. Supportive care / pain control PRN.  Migraine Resume home Topamax, PRN Imitrex. Evaluation of headache as outlined.  Hypokalemia K 3.3 this AM, oral replacement ordered. Monitor BMP. Check Mg level.  Rheumatoid arthritis (HCC) See RA   Radiculopathy of cervical region Consider MRI c-spine if neck pain persist or worsens.  No weakness / no thenar atrophy.       {Tip this will not be part of the note when signed Body mass index is 31.98 kg/m. , ,  (Optional):26781}  {(NOTE) Pain control PDMP Statment (Optional):26782} Consultants: *** Procedures performed: ***  Disposition: {Plan; Disposition:26390} Diet recommendation:  {Diet_Plan:26776} DISCHARGE MEDICATION: Allergies as of 06/09/2023       Reactions   Cyanoacrylate Hives   Other Itching, Rash   Dermabond surgical glue. Dermabond surgical glue-blisters   Sulfa Antibiotics Anaphylaxis, Rash, Shortness Of Breath, Swelling   Angioedema (also)   Sulfonamide Derivatives Hives, Shortness Of Breath, Swelling   TONGUE SWELLS   Benadryl [diphenhydramine Hcl] Other (See Comments)   Hyperactivity   Sulfamethoxazole    Other Reaction(s): Angioedema   Diphenhydramine    Diphenhydramine Hcl    Other Reaction(s): Other (See Comments) HYPERACTIVITY, RESTLESS LEG   Silicone Rash   Watch band     Med Rec must be completed prior to using this [facility]***       Discharge Exam: Filed Weights   06/06/23 1339 06/07/23 0358 06/08/23 0439  Weight: 82.1 kg 82 kg 84.5 kg   ***  Condition at discharge: {DC Condition:26389}  The results of significant diagnostics from this hospitalization (including imaging, microbiology, ancillary and laboratory) are listed below for reference.   Imaging Studies: DG FL GUIDED LUMBAR PUNCTURE  Result Date: 06/08/2023 CLINICAL DATA:  Provided history: Headache. Additional history: Concern for  meningitis. Request received for fluoroscopically-guided lumbar puncture. Multiple recent prior unsuccessful lumbar punctures. EXAM: LUMBAR PUNCTURE UNDER FLUOROSCOPY FLUOROSCOPY: Radiation Exposure Index (as provided by the fluoroscopic device): 12.30 mGy Kerma COMPARISON:  Images from fluoroscopically-guided lumbar puncture 06/07/2023. PROCEDURE: Mina Marble, PA-C obtained informed consent from the patient prior to the procedure. This process included a discussion of procedure risks. An appropriate skin entry site was determined under fluoroscopy and marked. The lower back was prepped with Betadine and draped in the usual sterile fashion. Local anesthesia was provided with 1% lidocaine. Initially, Mina Marble, PA-C performed a lumbar puncture under fluoroscopic guidance at the L4-L5 level (to the right of midline). A small amount of CSF was transiently visualized within the needle hub. However, no CSF could be collected. Subsequently, Dr. Jackey Loge performed a lumbar puncture under fluoroscopic guidance at the L4-L5 level (to the left of midline). However, there was no CSF return. An additional attempt was made at the same site, again without CSF return. At this point, the patient elected to terminate the procedure. The inner stylet was replaced within the needle, and the needle was removed in its entirety. No immediate post-procedure complication was apparent. Procedure performed by Mina Marble, PA-C and Dr Jackey Loge. IMPRESSION: 1. Unsuccessful fluoroscopically-guided L4-L5 lumbar punctures, as described. 2. No immediate post-procedure complication. Electronically Signed   By: Jackey Loge D.O.   On: 06/08/2023 16:03   DG FL GUIDED LUMBAR PUNCTURE  Result Date: 06/07/2023 CLINICAL  DATA:  47 year old female admitted with complaint of headache and stiff neck suspicion of meningitis. Patient diagnosed with meningitis proximally 1 month ago patient was referred for fluoroscopic guided lumbar puncture.  EXAM: LUMBAR PUNCTURE UNDER FLUOROSCOPY PROCEDURE: An appropriate skin entry site was determined fluoroscopically. Operator donned sterile gloves and mask. Skin site was marked, then prepped with Betadine, draped in usual sterile fashion, and infiltrated locally with 1% lidocaine. A 20 gauge 3-1/2 inch spinal needle advanced into the thecal sac at L4-L5 from a left and right interlaminar approach with no return of CSF. Procedure was attempted by Dr. Allena Katz with 22 gauge 5 inch spinal needle at the L4-L5 level with no return of CSF. The needle was then removed. The patient tolerated the procedure well and there were no complications. FLUOROSCOPY: Radiation Exposure Index (as provided by the fluoroscopic device): 31.80 mGy Kerma IMPRESSION: Lumbar puncture attempted at L4-L5 level without return of CSF from suspected dehydration. Recommend IV hydration with another lumbar puncture attempt 06/08/2023. This exam was performed by Alex Gardener, NP, and was supervised and interpreted by Elige Ko, MD. Electronically Signed   By: Elige Ko M.D.   On: 06/07/2023 16:33   DG Chest 2 View  Result Date: 06/06/2023 CLINICAL DATA:  Cough, fever EXAM: CHEST - 2 VIEW COMPARISON:  None Available. FINDINGS: Lungs are clear. No pneumothorax or pleural effusion. Cardiac size within normal limits. Pulmonary vascularity is normal. Implanted loop recorder noted. No acute bone abnormality. IMPRESSION: No active cardiopulmonary disease. Electronically Signed   By: Helyn Numbers M.D.   On: 06/06/2023 19:14   CT Head Wo Contrast  Result Date: 06/06/2023 CLINICAL DATA:  Neck pain with nausea, vomiting and fever. EXAM: CT HEAD WITHOUT CONTRAST TECHNIQUE: Contiguous axial images were obtained from the base of the skull through the vertex without intravenous contrast. RADIATION DOSE REDUCTION: This exam was performed according to the departmental dose-optimization program which includes automated exposure control, adjustment of the mA  and/or kV according to patient size and/or use of iterative reconstruction technique. COMPARISON:  May 05, 2023 FINDINGS: Brain: No evidence of acute infarction, hemorrhage, hydrocephalus, extra-axial collection or mass lesion/mass effect. Vascular: No hyperdense vessel or unexpected calcification. Skull: Normal. Negative for fracture or focal lesion. Sinuses/Orbits: No acute finding. Other: None. IMPRESSION: Normal head CT. Electronically Signed   By: Aram Candela M.D.   On: 06/06/2023 18:05    Microbiology: Results for orders placed or performed during the hospital encounter of 06/06/23  Culture, blood (routine x 2)     Status: None (Preliminary result)   Collection Time: 06/06/23  3:34 PM   Specimen: BLOOD  Result Value Ref Range Status   Specimen Description BLOOD BLOOD LEFT FOREARM  Final   Special Requests   Final    BOTTLES DRAWN AEROBIC AND ANAEROBIC Blood Culture adequate volume   Culture   Final    NO GROWTH 3 DAYS Performed at [facility], [address], [place], [place] [address]    Report Status PENDING  Incomplete  Culture, blood (routine x 2)     Status: None (Preliminary result)   Collection Time: 06/06/23  3:39 PM   Specimen: BLOOD  Result Value Ref Range Status   Specimen Description BLOOD RIGHT ANTECUBITAL  Final   Special Requests   Final    BOTTLES DRAWN AEROBIC AND ANAEROBIC Blood Culture adequate volume   Culture   Final    NO GROWTH 3 DAYS Performed at [facility], [address]  [place], [place] [address]    Report Status PENDING  Incomplete    Labs: CBC: Recent Labs  Lab 06/06/23 1407 06/07/23 0500  WBC 10.5 9.4  HGB 14.3 11.1*  HCT 42.5 33.3*  MCV 90.6 90.0  PLT 283 224   Basic Metabolic Panel: Recent Labs  Lab 06/06/23 1407 06/07/23 0500 06/08/23 0633  NA 137 141 140  K 4.0 3.3* 3.3*  CL 107 109 107  CO2 20* 22 25  GLUCOSE 115* 101* 91  BUN 6 9 9   CREATININE 0.90 0.87 0.97  CALCIUM 9.6 8.2* 8.8*  MG  --    --  1.8   Liver Function Tests: Recent Labs  Lab 06/06/23 1407 06/07/23 0500  AST 31 28  ALT 17 15  ALKPHOS 93 80  BILITOT 0.6 0.5  PROT 7.6 6.1*  ALBUMIN 4.2 3.1*   CBG: No results for input(s): "GLUCAP" in the last 168 hours.  Discharge time spent: {LESS THAN/GREATER UEAV:40981} 30 minutes.  Signed: Pennie Banter, DO [facility] 06/09/2023

## 2023-06-09 NOTE — Progress Notes (Signed)
ID Pt doing fine No fever this admission Headache fluctuates   Patient Vitals for the past 24 hrs:  BP Temp Temp src Pulse Resp SpO2  06/09/23 0821 115/70 98.3 F (36.8 C) Oral 96 17 100 %  06/08/23 2227 106/72 98 F (36.7 C) Oral 98 20 97 %  06/08/23 1615 108/89 98.6 F (37 C) Oral 99 18 99 %   Awake and alert Chest CTA Hss1s2 Abd soft CNS non focal Skin no rash   Labs    Latest Ref Rng & Units 06/07/2023    5:00 AM 06/06/2023    2:07 PM 05/03/2023    6:30 AM  CBC  WBC 4.0 - 10.5 K/uL 9.4  10.5  8.0   Hemoglobin 12.0 - 15.0 g/dL 47.8  29.5  62.1   Hematocrit 36.0 - 46.0 % 33.3  42.5  36.7   Platelets 150 - 400 K/uL 224  283  214        Latest Ref Rng & Units 06/08/2023    6:33 AM 06/07/2023    5:00 AM 06/06/2023    2:07 PM  CMP  Glucose 70 - 99 mg/dL 91  308  657   BUN 6 - 20 mg/dL 9  9  6    Creatinine 0.44 - 1.00 mg/dL 8.46  9.62  9.52   Sodium 135 - 145 mmol/L 140  141  137   Potassium 3.5 - 5.1 mmol/L 3.3  3.3  4.0   Chloride 98 - 111 mmol/L 107  109  107   CO2 22 - 32 mmol/L 25  22  20    Calcium 8.9 - 10.3 mg/dL 8.8  8.2  9.6   Total Protein 6.5 - 8.1 g/dL  6.1  7.6   Total Bilirubin 0.3 - 1.2 mg/dL  0.5  0.6   Alkaline Phos 38 - 126 U/L  80  93   AST 15 - 41 U/L  28  31   ALT 0 - 44 U/L  15  17      Infectious disease work up Blood West nile virus IgG and IgM neg Toxo Neg Crypto antigen Neg Bartonella panel neg Lyme Negative Fungal antibodies are pending  Autoimmune Rh factor negative ACE level N PTH N Cosyntropin test N  Impression/recommendation 47 yr female with h/o Immune globulin deficiency, rheumatoid arthritis, Eosinophilic esophagitis, chronic W41 deficiency for which she is followed at [facility] , recent aseptic meninigits with no viral or bacterial cause and thought to be due to IVIG infusion, negative Autoimmune and paraneoplastic blood work presents with headache and fever   Fever- There is no documentation in the hospital- last time Tmax  once was 100.5 She need to keep a fever journal  She says she has had intermittent fever for many months to year Could she have one of the Autoinflammatory condition like TRAPS, FMF, though other features are lacking    Headache- she has migraine and recently had aseptic meningitis with Csf cell count of 700 cells with  70% N- negative meninigitis and encephalitis work up for a few virus and bacteria  It may be interesting to repeat LP (but multiple failed attempts) and see whether it is a resolving aseptic meningitis or worsening  tests to check on csf other than common ME panel would be VDRL, TB, WNV , arbovirus paraneoplastic, sarcoid etc Should save CSF for further tests No evidence clinically of bacterial meningiits-on no antibiotics    Most of infectious condition does not cause intermittent fever ,  except occasionally relapsing fever due to borrelia ( test pending) As she has cats need to r/o bartonella-Negative  WNV Negative Fungal antibodies P  TB is unlikely as no fever , CXR N   quant gold pending Toxo/crypto negative     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 MEN ? Rheumatoid arthritis not on any meds- Rh factor N ESR barely elevated, N crp   Migraine  The work up done in the blood till now does not show any infectious process to explain the headache or the fever at home but none here  Discussed the management with Dr.Griffith   Pt is being discharged today and asked to follow up with her physicians at [facility]

## 2023-06-09 NOTE — Progress Notes (Signed)
Subjective: Headache is currently rated at 7/10  Objective: Current vital signs: BP 112/60 (BP Location: Right Arm)   Pulse 99   Temp 98.4 F (36.9 C) (Oral)   Resp 18   Ht 5\' 4"  (1.626 m)   Wt 84.5 kg   SpO2 98%   BMI 31.98 kg/m  Vital signs in last 24 hours: Temp:  [98 F (36.7 C)-98.6 F (37 C)] 98.4 F (36.9 C) (06/14 0900) Pulse Rate:  [96-99] 99 (06/14 0900) Resp:  [17-20] 18 (06/14 0900) BP: (106-115)/(60-89) 112/60 (06/14 0900) SpO2:  [97 %-100 %] 98 % (06/14 0900)  Intake/Output from previous day: 06/13 0701 - 06/14 0700 In: -  Out: 650 [Urine:650] Intake/Output this shift: Total I/O In: 600 [P.O.:600] Out: -  Nutritional status:  Diet Order             Diet - low sodium heart healthy           Diet heart healthy/carb modified Room service appropriate? Yes; Fluid consistency: Thin  Diet effective now                  Physical Exam HEENT- [place]/AT. No nuchal rigidity. Massage of neck and shoulder muscles has no effect on her headache.  Lungs- Respirations unlabored Extremities- Warm and well-perfused   Neurological Examination Mental Status: Awake, alert and oriented. Thought content appropriate.  Speech fluent without evidence of aphasia. Able to follow all commands without difficulty. Cranial Nerves: II: PERRL. III,IV, VI: No ptosis. EOMI. No nystagmus. VII: Smile symmetric VIII: Hearing intact to voice IX,X: No hypophonia or hoarseness XI: Symmetric XII: Midline tongue extension Motor: RUE: 5/5 LUE: 5/5 RLE: 5/5 LLE: 5/5 Sensory: Grossly intact to FT x 4 Cerebellar: No ataxia with FNF bilaterally Gait: Deferred  Lab Results: Results for orders placed or performed during the hospital encounter of 06/06/23 (from the past 48 hour(s))  Basic metabolic panel     Status: Abnormal   Collection Time: 06/08/23  6:33 AM  Result Value Ref Range   Sodium 140 135 - 145 mmol/L   Potassium 3.3 (L) 3.5 - 5.1 mmol/L   Chloride 107 98 - 111 mmol/L    CO2 25 22 - 32 mmol/L   Glucose, Bld 91 70 - 99 mg/dL    Comment: Glucose reference range applies only to samples taken after fasting for at least 8 hours.   BUN 9 6 - 20 mg/dL   Creatinine, Ser 1.91 0.44 - 1.00 mg/dL   Calcium 8.8 (L) 8.9 - 10.3 mg/dL   GFR, Estimated >47 >82 mL/min    Comment: (NOTE) Calculated using the CKD-EPI Creatinine Equation (2021)    Anion gap 8 5 - 15    Comment: Performed at [facility], [address], [place], [place] [address]  Magnesium     Status: None   Collection Time: 06/08/23  6:33 AM  Result Value Ref Range   Magnesium 1.8 1.7 - 2.4 mg/dL    Comment: Performed at [facility], [address] [place], [place] [address]  Bartonella antibody panel     Status: None   Collection Time: 06/08/23  6:33 AM  Result Value Ref Range   B henselae IgG Negative Neg:<1:320 titer   B henselae IgM Negative Neg:<1:100 titer   B quintana IgG Negative Neg:<1:320 titer   B Quintana IgM Negative Neg:<1:100 titer    Comment: (NOTE) Note: Bartonella henselae is now regarded as the etiologic agent of [place]ratch Disease, bacillary angiomatosis,  endocarditis and fever with bacteremia.  Bartonella quintana also causes bacillary angiomatosis particularly among immunocompromised patients, and trench fever. This test was developed and its performance characteristics determined by [facility].  It has not been cleared or approved by the Food and Drug Administration.  The FDA has determined that such clearance or approval is not necessary. Performed At: [facility] [address] [place], [place] [id number] Jolene Schimke MD WU:[id number]   Cortisol-am, blood     Status: Abnormal   Collection Time: 06/08/23  6:33 AM  Result Value Ref Range   Cortisol - AM 2.6 (L) 6.7 - 22.6 ug/dL    Comment: Performed at [facility], [address], [place], [place] [address]  Cryptococcus Antigen, Serum     Status: None   Collection Time:  06/08/23  6:33 AM  Result Value Ref Range   Cryptococcus Antigen, Serum Negative Negative    Comment: (NOTE) Performed At: [facility] [address] [place], [place] [id number] Jolene Schimke MD [id number]   Lyme Disease Serology w/Reflex     Status: None   Collection Time: 06/08/23  6:33 AM  Result Value Ref Range   Lyme Total Antibody EIA Negative Negative    Comment: (NOTE) Lyme antibodies not detected. Reflex testing is not indicated. No laboratory evidence of infection with B. burgdorferi (Lyme disease). Negative results may occur in patients recently infected (less than or equal to 14 days) with B. burgdorferi.  If recent infection is suspected, repeat testing on a new sample collected in 7 to 14 days is recommended. Performed At: [facility] [address] [place], [place] [id number] Jolene Schimke MD GU:[id number]   Ferritin     Status: None   Collection Time: 06/08/23  6:33 AM  Result Value Ref Range   Ferritin 44 11 - 307 ng/mL    Comment: Performed at [facility], [address] [place], [place] [address]  Angiotensin converting enzyme     Status: None   Collection Time: 06/08/23  6:33 AM  Result Value Ref Range   Angiotensin-Converting Enzyme 42 14 - 82 U/L    Comment: (NOTE) Performed At: [facility] [address] [place], [place] [id number] Jolene Schimke MD OA:[id number]   Toxoplasma antibodies- IgG and  IgM     Status: None   Collection Time: 06/08/23  6:35 AM  Result Value Ref Range   Toxoplasma IgG Ratio 3.3 0.0 - 7.1 IU/mL    Comment: (NOTE)                                 Negative        <7.2                                 Equivocal  7.2 - 8.7                                 Positive        >8.7    Toxoplasma Antibody- IgM <3.0 0.0 - 7.9 AU/mL    Comment: (NOTE)                             Negative            <8.0  Equivocal      8.0 - 9.9                             Positive             >9.9    Comment Comment     Comment: (NOTE) No serological evidence of infection with Toxoplasma. If symptoms persist, submit a new specimen after three weeks. Performed At: [facility] [address] [place], [place] [id number] Jolene Schimke MD WU:[id number]   Miscellaneous [facility] test (send-out)     Status: None   Collection Time: 06/08/23  6:36 AM  Result Value Ref Range   [facility] test code [id number]    [facility] test name WEST NILE VIRUS     Comment: Performed at [facility], [address] [place], [place] [address]   Misc [facility] result COMMENT     Comment: (NOTE) Test Ordered: [id number] West Nile Virus Antibody,Serum West Nile Virus, IgG           Negative                  BN     Reference Range: Negative                              West Nile Virus, IgM           Negative                  BN     Reference Range: Negative                              Performed At: [facility] [address] [place], [place] [id number] Jolene Schimke MD MW:[id number]   PTH, intact and calcium     Status: Abnormal   Collection Time: 06/08/23  6:36 AM  Result Value Ref Range   PTH 28 15 - 65 pg/mL   Calcium, Total (PTH) 8.4 (L) 8.7 - 10.2 mg/dL   PTH Interp Comment     Comment: (NOTE) Interpretation                 Intact PTH    Calcium                                (pg/mL)      (mg/dL) Normal                          15 - 65     8.6 - 10.2 Primary Hyperparathyroidism         >65          >10.2 Secondary Hyperparathyroidism       >65          <10.2 Non-Parathyroid Hypercalcemia       <65          >10.2 Hypoparathyroidism                  <15          < 8.6 Non-Parathyroid Hypocalcemia    15 - 65          < 8.6 Performed At: [facility] [address] [place], [place] [id number] Jolene Schimke MD DG:[id number]  Basic metabolic panel     Status: Abnormal   Collection Time: 06/09/23  8:47 AM  Result Value Ref Range   Sodium 140 135 - 145  mmol/L   Potassium 3.7 3.5 - 5.1 mmol/L   Chloride 107 98 - 111 mmol/L   CO2 23 22 - 32 mmol/L   Glucose, Bld 128 (H) 70 - 99 mg/dL    Comment: Glucose reference range applies only to samples taken after fasting for at least 8 hours.   BUN 8 6 - 20 mg/dL   Creatinine, Ser 1.61 0.44 - 1.00 mg/dL   Calcium 9.4 8.9 - 09.6 mg/dL   GFR, Estimated >04 >54 mL/min    Comment: (NOTE) Calculated using the CKD-EPI Creatinine Equation (2021)    Anion gap 10 5 - 15    Comment: Performed at [facility], [address], [place], [place] [address]    Recent Results (from the past 240 hour(s))  Culture, blood (routine x 2)     Status: None (Preliminary result)   Collection Time: 06/06/23  3:34 PM   Specimen: BLOOD  Result Value Ref Range Status   Specimen Description BLOOD BLOOD LEFT FOREARM  Final   Special Requests   Final    BOTTLES DRAWN AEROBIC AND ANAEROBIC Blood Culture adequate volume   Culture   Final    NO GROWTH 3 DAYS Performed at [facility], [address], [place], [place] [address]    Report Status PENDING  Incomplete  Culture, blood (routine x 2)     Status: None (Preliminary result)   Collection Time: 06/06/23  3:39 PM   Specimen: BLOOD  Result Value Ref Range Status   Specimen Description BLOOD RIGHT ANTECUBITAL  Final   Special Requests   Final    BOTTLES DRAWN AEROBIC AND ANAEROBIC Blood Culture adequate volume   Culture   Final    NO GROWTH 3 DAYS Performed at [facility], [address], [place], [place] [address]    Report Status PENDING  Incomplete    Lipid Panel No results for input(s): "CHOL", "TRIG", "HDL", "CHOLHDL", "VLDL", "LDLCALC" in the last 72 hours.  Studies/Results: DG FL GUIDED LUMBAR PUNCTURE  Result Date: 06/08/2023 CLINICAL DATA:  Provided history: Headache. Additional history: Concern for meningitis. Request received for fluoroscopically-guided lumbar puncture. Multiple recent prior unsuccessful lumbar  punctures. EXAM: LUMBAR PUNCTURE UNDER FLUOROSCOPY FLUOROSCOPY: Radiation Exposure Index (as provided by the fluoroscopic device): 12.30 mGy Kerma COMPARISON:  Images from fluoroscopically-guided lumbar puncture 06/07/2023. PROCEDURE: Mina Marble, PA-C obtained informed consent from the patient prior to the procedure. This process included a discussion of procedure risks. An appropriate skin entry site was determined under fluoroscopy and marked. The lower back was prepped with Betadine and draped in the usual sterile fashion. Local anesthesia was provided with 1% lidocaine. Initially, Mina Marble, PA-C performed a lumbar puncture under fluoroscopic guidance at the L4-L5 level (to the right of midline). A small amount of CSF was transiently visualized within the needle hub. However, no CSF could be collected. Subsequently, Dr. Jackey Loge performed a lumbar puncture under fluoroscopic guidance at the L4-L5 level (to the left of midline). However, there was no CSF return. An additional attempt was made at the same site, again without CSF return. At this point, the patient elected to terminate the procedure. The inner stylet was replaced within the needle, and the needle was removed in its entirety. No immediate post-procedure complication was apparent. Procedure performed by Mina Marble, PA-C  and Dr Jackey Loge. IMPRESSION: 1. Unsuccessful fluoroscopically-guided L4-L5 lumbar punctures, as described. 2. No immediate post-procedure complication. Electronically Signed   By: Jackey Loge D.O.   On: 06/08/2023 16:03    Medications: Scheduled:  baclofen  10 mg Oral QHS   cycloSPORINE  1 drop Both Eyes BID   famotidine  40 mg Oral QHS   fluconazole  200 mg Oral Daily   heparin  5,000 Units Subcutaneous Q8H   mometasone-formoterol  2 puff Inhalation BID   nystatin  10 mL Oral TID   olopatadine  1 drop Both Eyes BID   pantoprazole  40 mg Oral BID   pravastatin  40 mg Oral QHS   sodium chloride flush  3 mL  Intravenous Q12H   topiramate  100 mg Oral Daily   topiramate  25 mg Oral QHS   valACYclovir  1,000 mg Oral Daily    Assessment: 47 y.o. 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  LP was attempted in the ED but was unsuccessful. She has been started on empiric ABX coverage. Has had recurrent unexplained fevers over approximately the past year, but is currently afebrile.  - Exam today is nonfocal. No neck stiffness. She is complaining of a 7/10 headache, increased from 6/10 overnight.  - CT head this admission: Normal.  - Extensive prior work up last month, including MRI brain w/wo contrast, CTV, lupus panel and paraneoplastic panel was negative, except for elevated CSF protein and markedly elevated CSF white count without neutrophilic predominance.  - Labs: - No leukocytosis on CBC. - ESR elevated at 27 - D-dimer elevated at 1.05 - TSH normal - Initial bedside LP attempt followed by two attempts under fluroscopic guidance were all unsuccessful. Regarding the latter, LP needle was in the thecal sac based on image guidance, but no fluid returned.  - DDx for presentation with progressive headache in the setting of a prior diagnosis of aseptic meningitis includes unusual latent infectious causes with waxing/waning disease activity, autoimmune process and rare known side effect of IVIG. Of note, numerous viruses can cause aseptic meningitis and, although viral panels often come back negative, a viral etiology in a patient with otherwise negative testing can never be completely ruled out. Although most cases  of aseptic meningitis are self-limiting, some cases can be due to treatable chronic infectious or inflammatory etiologies.    Recommendations: - Regarding treatment, unfortunately, per literature search, given that there are multiple potential infectious causes of aseptic meningitis, steroids should not be used given their inhibitory effects on host immune response. The exception for this would be if there is elevated intracranial pressure. As her symptoms and signs are not consistent with elevated ICP, would hold off on steroids.  - She will need close follow up outpatient at [facility], about q4 weeks, to make sure that her symptoms are not worsening. At some point another attempt should be made at obtaining CSF. A cervical tap could be attempted by Neurosurgery, but risks of such may outweigh potential benefits, unless she clinically worsens.  - As IVIG can cause aseptic meningitis, along with other medications including OKT3, NSAIDs and some antibiotics, would be cautious about restarting IVIG and would avoid  NSAIDs for management of her headache.  - Can try Compazine 10 mg IV x 1. If there is improvement, then a migraine component to her headache pain would be more likely.  - Neurohospitalist service will sign off. Please call if there are additional questions.    LOS: 2 days   @Electronically  signed: Dr. Caryl Pina 06/09/2023  2:55 PM

## 2023-06-09 NOTE — Plan of Care (Signed)
  Problem: Clinical Measurements: Goal: Ability to maintain clinical measurements within normal limits will improve Outcome: Progressing Goal: Will remain free from infection Outcome: Progressing   Problem: Pain Managment: Goal: General experience of comfort will improve Outcome: Progressing   Problem: Safety: Goal: Ability to remain free from injury will improve Outcome: Progressing   

## 2023-06-10 LAB — CULTURE, BLOOD (ROUTINE X 2): Culture: NO GROWTH

## 2023-06-10 LAB — ACTH: C206 ACTH: 6.3 pg/mL — ABNORMAL LOW (ref 7.2–63.3)

## 2023-06-11 LAB — CULTURE, BLOOD (ROUTINE X 2)
Culture: NO GROWTH
Special Requests: ADEQUATE

## 2023-06-11 LAB — MISC LABCORP TEST (SEND OUT): Labcorp test code: 164284

## 2023-06-12 ENCOUNTER — Encounter: Payer: Self-pay | Admitting: Internal Medicine

## 2023-06-12 LAB — HISTOPLASMA ANTIGEN, URINE: Histoplasma Antigen, urine: NEGATIVE (ref <0.2)

## 2023-06-12 NOTE — Assessment & Plan Note (Signed)
Recent.  Unable to obtain CSF despite three attempts this admission and aggressively hydrated with IV fluids.

## 2023-06-13 LAB — MISC LABCORP TEST (SEND OUT): Labcorp test code: 165048

## 2023-06-13 LAB — QUANTIFERON-TB GOLD PLUS (RQFGPL)
QuantiFERON Mitogen Value: >10 IU/mL
QuantiFERON Nil Value: 0.06 IU/mL
QuantiFERON TB1 Ag Value: 0.07 IU/mL
QuantiFERON TB2 Ag Value: 0.06 IU/mL

## 2023-06-13 LAB — QUANTIFERON-TB GOLD PLUS: QuantiFERON-TB Gold Plus: NEGATIVE

## 2023-06-14 DIAGNOSIS — R0789 Other chest pain: Secondary | ICD-10-CM | POA: Insufficient documentation

## 2023-06-14 DIAGNOSIS — I951 Orthostatic hypotension: Secondary | ICD-10-CM | POA: Insufficient documentation

## 2023-06-14 DIAGNOSIS — E876 Hypokalemia: Secondary | ICD-10-CM | POA: Insufficient documentation

## 2023-06-14 DIAGNOSIS — Z1152 Encounter for screening for COVID-19: Secondary | ICD-10-CM | POA: Diagnosis not present

## 2023-06-14 LAB — BLASTOMYCES ANTIGEN: Blastomyces Antigen: NOT DETECTED ng/mL

## 2023-06-15 ENCOUNTER — Emergency Department: Payer: BC Managed Care – PPO

## 2023-06-15 ENCOUNTER — Other Ambulatory Visit: Payer: Self-pay

## 2023-06-15 ENCOUNTER — Encounter: Payer: Self-pay | Admitting: Emergency Medicine

## 2023-06-15 ENCOUNTER — Emergency Department
Admission: EM | Admit: 2023-06-15 | Discharge: 2023-06-15 | Disposition: A | Discharge status: Home / Self Care | Payer: BC Managed Care – PPO | Attending: Emergency Medicine | Admitting: Emergency Medicine

## 2023-06-15 DIAGNOSIS — I951 Orthostatic hypotension: Secondary | ICD-10-CM

## 2023-06-15 DIAGNOSIS — R0789 Other chest pain: Secondary | ICD-10-CM

## 2023-06-15 LAB — COMPREHENSIVE METABOLIC PANEL
ALT: 28 U/L (ref 0–44)
AST: 47 U/L — ABNORMAL HIGH (ref 15–41)
Albumin: 4.4 g/dL (ref 3.5–5.0)
Alkaline Phosphatase: 96 U/L (ref 38–126)
Anion gap: 13 (ref 5–15)
BUN: 9 mg/dL (ref 6–20)
CO2: 24 mmol/L (ref 22–32)
Calcium: 9.3 mg/dL (ref 8.9–10.3)
Chloride: 99 mmol/L (ref 98–111)
Creatinine, Ser: 0.94 mg/dL (ref 0.44–1.00)
GFR, Estimated: >60 mL/min (ref >60)
Glucose, Bld: 103 mg/dL — ABNORMAL HIGH (ref 70–99)
Potassium: 3.3 mmol/L — ABNORMAL LOW (ref 3.5–5.1)
Sodium: 136 mmol/L (ref 135–145)
Total Bilirubin: 0.5 mg/dL (ref 0.3–1.2)
Total Protein: 7.9 g/dL (ref 6.5–8.1)

## 2023-06-15 LAB — CBC WITH DIFFERENTIAL/PLATELET
Abs Immature Granulocytes: 0.02 10*3/uL (ref 0.00–0.07)
Basophils Absolute: 0 10*3/uL (ref 0.0–0.1)
Basophils Relative: 1 %
Eosinophils Absolute: 0.5 10*3/uL (ref 0.0–0.5)
Eosinophils Relative: 7 %
HCT: 38.1 % (ref 36.0–46.0)
Hemoglobin: 12.5 g/dL (ref 12.0–15.0)
Immature Granulocytes: 0 %
Lymphocytes Relative: 43 %
Lymphs Abs: 3 10*3/uL (ref 0.7–4.0)
MCH: 30.4 pg (ref 26.0–34.0)
MCHC: 32.8 g/dL (ref 30.0–36.0)
MCV: 92.7 fL (ref 80.0–100.0)
Monocytes Absolute: 0.5 10*3/uL (ref 0.1–1.0)
Monocytes Relative: 7 %
Neutro Abs: 2.9 10*3/uL (ref 1.7–7.7)
Neutrophils Relative %: 42 %
Platelets: 349 10*3/uL (ref 150–400)
RBC: 4.11 MIL/uL (ref 3.87–5.11)
RDW: 14.6 % (ref 11.5–15.5)
WBC: 6.9 10*3/uL (ref 4.0–10.5)
nRBC: 0 % (ref 0.0–0.2)

## 2023-06-15 LAB — TROPONIN I (HIGH SENSITIVITY)
Troponin I (High Sensitivity): 3 ng/L (ref <18)
Troponin I (High Sensitivity): 3 ng/L (ref <18)

## 2023-06-15 LAB — SARS CORONAVIRUS 2 BY RT PCR: SARS Coronavirus 2 by RT PCR: NEGATIVE

## 2023-06-15 LAB — MAGNESIUM: Magnesium: 2.1 mg/dL (ref 1.7–2.4)

## 2023-06-15 MED ORDER — IOHEXOL 350 MG/ML SOLN
75.0000 mL | Freq: Once | INTRAVENOUS | Status: AC | PRN
  Administered 2023-06-15: 75 mL via INTRAVENOUS

## 2023-06-15 MED ORDER — ONDANSETRON HCL 4 MG/2ML IJ SOLN
4.0000 mg | Freq: Once | INTRAMUSCULAR | Status: AC
  Administered 2023-06-15: 4 mg via INTRAVENOUS
  Filled 2023-06-15: qty 2

## 2023-06-15 MED ORDER — LACTATED RINGERS IV BOLUS
1000.0000 mL | Freq: Once | INTRAVENOUS | Status: AC
  Administered 2023-06-15: 1000 mL via INTRAVENOUS

## 2023-06-15 MED ORDER — FENTANYL CITRATE PF 50 MCG/ML IJ SOSY
50.0000 ug | PREFILLED_SYRINGE | Freq: Once | INTRAMUSCULAR | Status: AC
  Administered 2023-06-15: 50 ug via INTRAVENOUS
  Filled 2023-06-15: qty 1

## 2023-06-15 MED ORDER — KETOROLAC TROMETHAMINE 30 MG/ML IJ SOLN
15.0000 mg | Freq: Once | INTRAMUSCULAR | Status: AC
  Administered 2023-06-15: 15 mg via INTRAVENOUS
  Filled 2023-06-15: qty 1

## 2023-06-15 NOTE — ED Triage Notes (Signed)
Pt to triage via w/c with no distress noted ; reports upper mid CP, nonradiating accomp by tachycardia with exertion, SHOB, dizziness, cough prod at times with green sputum noted, fever

## 2023-06-15 NOTE — ED Provider Notes (Signed)
[facility] Provider Note    Event Date/Time   First MD Initiated Contact with Patient 06/15/23 [phone]     (approximate)   History   Chest Pain   HPI  [name] Horn is a 47 y.o. female who presents to the ED for evaluation of Chest Pain   I reviewed medical DC summary from 6 days ago where she was admitted for severe headache and possible aseptic meningitis, 2 IR taps were unsuccessful.  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  Eosinophilic esophagitis.  RA, left leg DVT in the past  Patient presents alongside her husband for evaluation of upper chest discomfort and orthostasis.  Reports she has been doing well since her recent discharge without recurrence of headaches, no fevers.  She reports mauling in bed tonight she was feeling somewhat dizzy, worse with standing but no syncope or falls.  She was feeling some tight sharpness to her upper chest and mild sensation of dyspnea without cough.  Physical Exam   Triage Vital Signs: ED Triage Vitals  Enc Vitals Group     BP 06/15/23 0002 125/85     Pulse Rate 06/15/23 0002 (!) 122     Resp 06/15/23 0002 18     Temp 06/15/23 0002 98.9 F (37.2 C)     Temp Source 06/15/23 0002 Oral     SpO2 06/15/23 0002 97 %     Weight 06/15/23 0004 182 lb (82.6 kg)     Height 06/15/23 0004 5\' 4"  (1.626 m)     Head Circumference --      Peak Flow --      Pain Score 06/15/23 0004 5     Pain Loc --      Pain Edu? --      Excl. in GC? --     Most recent vital signs: Vitals:   06/15/23 0300 06/15/23 0330  BP: 105/74 116/77  Pulse: 99 96  Resp: 12 12  Temp:    SpO2:      General: Awake, no distress.  CV:  Good peripheral perfusion.  Resp:  Normal effort.  Abd:  No distention.  MSK:  No deformity noted.  Mild left calf tenderness without overlying skin changes, swelling or visual asymmetry when compared to the right.  No neurovascular deficits Neuro:  No  focal deficits appreciated. Other:     ED Results / Procedures / Treatments   Labs (all labs ordered are listed, but only abnormal results are displayed) Labs Reviewed  COMPREHENSIVE METABOLIC PANEL - Abnormal; Notable for the following components:      Result Value   Potassium 3.3 (*)    Glucose, Bld 103 (*)    AST 47 (*)    All other components within normal limits  SARS CORONAVIRUS 2 BY RT PCR  CBC WITH DIFFERENTIAL/PLATELET  MAGNESIUM  TROPONIN I (HIGH SENSITIVITY)  TROPONIN I (HIGH SENSITIVITY)    EKG Sinus tachycardia with a rate of 123 bpm.  Normal axis and intervals.  No clear signs of acute ischemia.  RADIOLOGY CXR interpreted by me without evidence of acute cardiopulmonary pathology. CTA chest interpreted by me without PE Venous ultrasound of bilateral lower extremities without signs of DVT  Official radiology report(s): US Venous Img Lower Bilateral  Addendum Date: 06/15/2023   ADDENDUM REPORT: 06/15/2023 05:07 ADDENDUM: Please note this exam covers the bilateral lower extremity deep venous system which is patent on both sides. Electronically Signed  By: Tiburcio Pea M.D.   On: 06/15/2023 05:07   Result Date: 06/15/2023 CLINICAL DATA:  Left calf pain with history of DVT EXAM: LEFT LOWER EXTREMITY VENOUS DOPPLER ULTRASOUND TECHNIQUE: Gray-scale sonography with compression, as well as color and duplex ultrasound, were performed to evaluate the deep venous system(s) from the level of the common femoral vein through the popliteal and proximal calf veins. COMPARISON:  None Available. FINDINGS: VENOUS Normal compressibility of the common femoral, superficial femoral, and popliteal veins, as well as the visualized calf veins. Visualized portions of profunda femoral vein and great saphenous vein unremarkable. No filling defects to suggest DVT on grayscale or color Doppler imaging. Doppler waveforms show normal direction of venous flow, normal respiratory plasticity and  response to augmentation. Limited views of the contralateral common femoral vein are unremarkable. IMPRESSION: Negative for DVT in the left lower extremity Electronically Signed: By: Tiburcio Pea M.D. On: 06/15/2023 04:42   CT Angio Chest PE W and/or Wo Contrast  Result Date: 06/15/2023 CLINICAL DATA:  Chest pain, dyspnea, tachycardia. History of DVT. Evaluate for PE. EXAM: CT ANGIOGRAPHY CHEST WITH CONTRAST TECHNIQUE: Multidetector CT imaging of the chest was performed using the standard protocol during bolus administration of intravenous contrast. Multiplanar CT image reconstructions and MIPs were obtained to evaluate the vascular anatomy. RADIATION DOSE REDUCTION: This exam was performed according to the departmental dose-optimization program which includes automated exposure control, adjustment of the mA and/or kV according to patient size and/or use of iterative reconstruction technique. CONTRAST:  75mL OMNIPAQUE IOHEXOL 350 MG/ML SOLN COMPARISON:  CT chest 06/15/2023 and CTA chest 05/01/2023 FINDINGS: Cardiovascular: Satisfactory opacification of the pulmonary arteries to the segmental level. No evidence of pulmonary embolism. Normal heart size. No pericardial effusion. Mediastinum/Nodes: No enlarged mediastinal, hilar, or axillary lymph nodes. Thyroid gland, trachea, and esophagus demonstrate no significant findings. Lungs/Pleura: Lungs are clear. No pleural effusion or pneumothorax. Upper Abdomen: Cholecystectomy.  No acute abnormality. Musculoskeletal: No chest wall abnormality. No acute osseous findings. Review of the MIP images confirms the above findings. IMPRESSION: 1. No evidence of pulmonary embolism or acute intrathoracic process. Electronically Signed   By: Minerva Fester M.D.   On: 06/15/2023 03:58   DG Chest 2 View  Result Date: 06/15/2023 CLINICAL DATA:  Upper chest pain EXAM: CHEST - 2 VIEW COMPARISON:  06/06/2023 FINDINGS: The heart size and mediastinal contours are within normal  limits. Both lungs are clear. The visualized skeletal structures are unremarkable. Loop recorder is again seen. IMPRESSION: No active cardiopulmonary disease. Electronically Signed   By: Alcide Clever M.D.   On: 06/15/2023 00:25    PROCEDURES and INTERVENTIONS:  .1-3 Lead EKG Interpretation  Performed by: Delton Prairie, MD Authorized by: Delton Prairie, MD     Interpretation: abnormal     ECG rate:  110   ECG rate assessment: tachycardic     Rhythm: sinus tachycardia     Ectopy: none     Conduction: normal     Medications  lactated ringers bolus 1,000 mL (0 mLs Intravenous Stopped 06/15/23 0457)  fentaNYL (SUBLIMAZE) injection 50 mcg (50 mcg Intravenous Given 06/15/23 0407)  ondansetron (ZOFRAN) injection 4 mg (4 mg Intravenous Given 06/15/23 0407)  iohexol (OMNIPAQUE) 350 MG/ML injection 75 mL (75 mLs Intravenous Contrast Given 06/15/23 0339)  ketorolac (TORADOL) 30 MG/ML injection 15 mg (15 mg Intravenous Given 06/15/23 0455)     IMPRESSION / MDM / ASSESSMENT AND PLAN / ED COURSE  I reviewed the triage vital signs and the nursing  notes.  Differential diagnosis includes, but is not limited to, ACS, PTX, PNA, muscle strain/spasm, PE, dissection, anxiety, pleural effusion  {Patient presents with symptoms of an acute illness or injury that is potentially life-threatening.  Patient presents with dizziness and atypical chest discomfort, possibly related to orthostasis and ultimately suitable for trial of outpatient management.  Presents tachycardic and this resolved with fluids.  Generally reassuring exam.  Does have some mild left calf tenderness without skin changes or further external signs of DVT.  Ultrasound of bilateral legs are negative for DVT.  CTA chest without evidence of PE.  Blood work with minimal hypokalemia, 2 negative troponins and a normal CBC.  Testing negative for COVID and with reassuring imaging.  Feeling better and suitable for trial of outpatient management, though I did  consider observation admission for this patient  Clinical Course as of 06/15/23 0512  Thu Jun 15, 2023  0458 Reassessed.  Feeling better.  Tachycardia is resolved with fluid resuscitation.  We discussed reassuring workup and plan of care.  She is comfortable going home.  We discussed the possibility of obs admission but after discussing this she is wanting to go home [DS]    Clinical Course User Index [DS] Delton Prairie, MD     FINAL CLINICAL IMPRESSION(S) / ED DIAGNOSES   Final diagnoses:  Other chest pain  Orthostasis     Rx / DC Orders   ED Discharge Orders     None        Note:  This document was prepared using Dragon voice recognition software and may include unintentional dictation errors.   Delton Prairie, MD 06/15/23 [phone]

## 2023-06-16 LAB — MISC LABCORP TEST (SEND OUT): Labcorp test code: 9985

## 2023-06-26 LAB — MISC LABCORP TEST (SEND OUT)
LabCorp test name: 3000010
Labcorp test code: 9985

## 2023-07-12 ENCOUNTER — Emergency Department
Admission: EM | Admit: 2023-07-12 | Discharge: 2023-07-12 | Disposition: A | Discharge status: Home / Self Care | Payer: BC Managed Care – PPO | Attending: Emergency Medicine | Admitting: Emergency Medicine

## 2023-07-12 ENCOUNTER — Other Ambulatory Visit: Payer: Self-pay

## 2023-07-12 DIAGNOSIS — T7840XA Allergy, unspecified, initial encounter: Secondary | ICD-10-CM | POA: Diagnosis present

## 2023-07-12 MED ORDER — LORATADINE 10 MG PO TABS
10.0000 mg | ORAL_TABLET | Freq: Once | ORAL | Status: AC
  Administered 2023-07-12: 10 mg via ORAL
  Filled 2023-07-12: qty 1

## 2023-07-12 MED ORDER — METHYLPREDNISOLONE SODIUM SUCC 125 MG IJ SOLR
125.0000 mg | Freq: Once | INTRAMUSCULAR | Status: AC
  Administered 2023-07-12: 125 mg via INTRAVENOUS
  Filled 2023-07-12: qty 2

## 2023-07-12 MED ORDER — FAMOTIDINE IN NACL 20-0.9 MG/50ML-% IV SOLN
20.0000 mg | Freq: Once | INTRAVENOUS | Status: AC
  Administered 2023-07-12: 20 mg via INTRAVENOUS
  Filled 2023-07-12: qty 50

## 2023-07-12 MED ORDER — OXYCODONE-ACETAMINOPHEN 5-325 MG PO TABS
2.0000 | ORAL_TABLET | Freq: Once | ORAL | Status: AC
  Administered 2023-07-12: 2 via ORAL
  Filled 2023-07-12: qty 2

## 2023-07-12 MED ORDER — PREDNISONE 20 MG PO TABS: 40.0000 mg | ORAL_TABLET | Freq: Every day | ORAL | 0 refills | Status: AC

## 2023-07-12 MED ORDER — EPINEPHRINE 0.3 MG/0.3ML IJ SOAJ: 0.3000 mg | Freq: Once | INTRAMUSCULAR | 0 refills | Status: AC

## 2023-07-12 MED ORDER — ONDANSETRON HCL 4 MG/2ML IJ SOLN
4.0000 mg | INTRAMUSCULAR | Status: AC
  Administered 2023-07-12: 4 mg via INTRAVENOUS
  Filled 2023-07-12: qty 2

## 2023-07-12 NOTE — ED Provider Notes (Signed)
[facility] Provider Note    Event Date/Time   First MD Initiated Contact with Patient 07/12/23 [phone]     (approximate)   History   Oral Swelling   HPI [name] Horn is a 47 y.o. female with extensive chronic medical history including eosinophilic esophagitis, rheumatoid arthritis causing chronic pain, etc.  She presents tonight by EMS for possible allergic reaction.  She took her weekly injection of Dupixent tonight and then shortly thereafter felt like her tongue was swelling and that it was hard to breathe.  She also notes that she ran out of her pain medicine earlier today.  She is not having any trouble breathing but was worried that she might get worsening swelling or shortness of breath and she states that she was told to call 911 if she gets short of breath after taking the injection of medication.  She has been on this medicine for about a year and has never had a similar reaction.  She is having no nausea, vomiting, abdominal pain, nor diarrhea.  She does not feel lightheaded or dizzy.  She said that she usually has a "opposite reaction" if she uses Benadryl but she has used Claritin in the past as an antihistamine without any issues.     Physical Exam   Triage Vital Signs: ED Triage Vitals  Encounter Vitals Group     BP 07/12/23 0044 127/89     Systolic BP Percentile --      Diastolic BP Percentile --      Pulse Rate 07/12/23 0044 (!) 103     Resp 07/12/23 0044 20     Temp 07/12/23 0044 98.2 F (36.8 C)     Temp Source 07/12/23 0044 Oral     SpO2 07/12/23 0044 100 %     Weight 07/12/23 0045 81.6 kg (180 lb)     Height 07/12/23 0045 1.626 m (5\' 4" )     Head Circumference --      Peak Flow --      Pain Score 07/12/23 0045 7     Pain Loc --      Pain Education --      Exclude from Growth Chart --     Most recent vital signs: Vitals:   07/12/23 0130 07/12/23 0200  BP: 117/76 101/70  Pulse: 92 88  Resp: 14 15  Temp:    SpO2: 97% 94%     General: Awake, no obvious distress. HEENT: Initially the patient's tongue appeared a bit swollen, but then she flatten it out and I was able to fully visualize her oropharynx.  There is no obvious angioedema at this time nor evidence of intraoral infection.  She also has no stridor upon auscultation of her trachea. CV:  Good peripheral perfusion.  Normal heart sounds. Resp:  Normal effort. Speaking easily and comfortably, no accessory muscle usage nor intercostal retractions.  Lungs clear to auscultation with no wheezing. Abd:  No distention.  No tenderness to palpation.   ED Results / Procedures / Treatments   Labs (all labs ordered are listed, but only abnormal results are displayed) Labs Reviewed - No data to display    PROCEDURES:  Critical Care performed: No  .1-3 Lead EKG Interpretation  Performed by: Loleta Rose, MD Authorized by: Loleta Rose, MD     Interpretation: abnormal     ECG rate:  102   ECG rate assessment: tachycardic     Rhythm: sinus tachycardia     Ectopy:  none     Conduction: normal       IMPRESSION / MDM / ASSESSMENT AND PLAN / ED COURSE  I reviewed the triage vital signs and the nursing notes.                              Differential diagnosis includes, but is not limited to, allergic reaction/anaphylaxis, narcotic withdrawal, viral illness.  Patient's presentation is most consistent with acute presentation with potential threat to life or bodily function.   Interventions/Medications given:  Medications  famotidine (PEPCID) IVPB 20 mg premix (0 mg Intravenous Stopped 07/12/23 0207)  loratadine (CLARITIN) tablet 10 mg (10 mg Oral Given 07/12/23 0101)  methylPREDNISolone sodium succinate (SOLU-MEDROL) 125 mg/2 mL injection 125 mg (125 mg Intravenous Given 07/12/23 0101)  ondansetron (ZOFRAN) injection 4 mg (4 mg Intravenous Given 07/12/23 0113)  oxyCODONE-acetaminophen (PERCOCET/ROXICET) 5-325 MG per tablet 2 tablet (2 tablets Oral Given  07/12/23 0115)    (Note:  hospital course my include additional interventions and/or labs/studies not listed above.)   The patient appears to be in no distress.  No obvious angioedema.  No indication for EpiPen at this time.  Is possible that running out of her chronic pain medication may be affecting the current situation, will investigate her chronic pain regimen and see if she needs anything now because she stated that she is out and has not had any medication for 6 or 7 hours.  No indication for lab work at this time nor advanced imaging.  I anticipate a period of observation after empiric treatment as listed above for possible allergic reaction and assuming she is stable she can be discharged for outpatient follow-up.   The patient is on the cardiac monitor to evaluate for evidence of arrhythmia and/or significant heart rate changes.   Clinical Course as of 07/12/23 0160  Wed Jul 12, 2023  0122 I verified that the patient takes fairly large doses of Percocet on a regular basis, going through 150 tablets of 10 mg Percocet every month.  She is requesting something for pain so I ordered 2 Percocet for while she is here.  She is also reporting some nausea so I ordered Zofran 4 mg IV. [CF]  0335 Patient says she feels much better.  No shortness of breath or swelling.  In fact she said her mouth and tongue feel back to normal.  She is comfortable with the plan for discharge.  We agreed to a short course of prednisone and she will follow-up with the doctor that prescribes her medication.  I gave my usual and customary return precautions and the patient is stable and appropriate for discharge. [CF]    Clinical Course User Index [CF] Loleta Rose, MD     FINAL CLINICAL IMPRESSION(S) / ED DIAGNOSES   Final diagnoses:  Allergic reaction, initial encounter     Rx / DC Orders   ED Discharge Orders          Ordered    EPINEPHrine (EPIPEN 2-PAK) 0.3 mg/0.3 mL IJ SOAJ injection   Once         07/12/23 0337    predniSONE (DELTASONE) 20 MG tablet  Daily with breakfast        07/12/23 1093             Note:  This document was prepared using Dragon voice recognition software and may include unintentional dictation errors.   Loleta Rose, MD 07/12/23 [phone]

## 2023-07-12 NOTE — ED Triage Notes (Addendum)
Pt to ED via [facility] pt reports after taking her Dupixent medication tonight she began to feel like her tongue began to swell. Pt denies difficulty swallowing or shortness of breath at this time. Pt denies any known allergy to medication she has been taking for a year, denies any reactions in the past.

## 2023-07-12 NOTE — Discharge Instructions (Addendum)
You have been seen in the Emergency Department (ED) today for an allergic reaction.  You have been stable throughout your stay in the Emergency Department.  Please take your medications as prescribed and follow up with your doctor as indicated.  You should also take a daily cetirizine (Zyrtec) or loratidine (Claritin) which should help control your allergic symptoms.  Please keep your Epi-Pen with you at all times and use it if experience shortness of breath or difficulty breathing or if you believe you are having a severe allergic reaction.  If you use the Epi-Pen, though, please call 911 afterwards or go immediately to your nearest Emergency Department.  Return to the Emergency Department (ED) if you experience any worsening or new symptoms that concern you.

## 2023-07-12 NOTE — ED Notes (Signed)
Pt given warm blanket.

## 2023-07-21 NOTE — Unmapped (Signed)
The [facility] has made a second and final attempt to reach this patient to refill the following medication: Dupixent.      We have left voicemails on the following phone numbers: [phone], have sent a MyChart message, have sent a text message to the following phone numbers: [phone], and have sent a Mychart questionnaire..    Dates contacted: 05/31/2023  06/08/2023  07/21/2023  Last scheduled delivery: 05/08/2023    The patient may be at risk of non-compliance with this medication. The patient should call the [facility] at [phone]  Option 4, then Option 2: Dermatology, Gastroenterology, Rheumatology to refill medication.    Alwyn Pea   [facility]

## 2023-08-25 ENCOUNTER — Other Ambulatory Visit: Payer: Self-pay

## 2023-08-25 ENCOUNTER — Emergency Department: Payer: Medicaid Other

## 2023-08-25 ENCOUNTER — Emergency Department
Admission: EM | Admit: 2023-08-25 | Discharge: 2023-08-26 | Disposition: A | Discharge status: Other Acute Inpatient Hospital | Payer: Medicaid Other | Attending: Student in an Organized Health Care Education/Training Program | Admitting: Student in an Organized Health Care Education/Training Program

## 2023-08-25 DIAGNOSIS — D806 Antibody deficiency with near-normal immunoglobulins or with hyperimmunoglobulinemia: Secondary | ICD-10-CM | POA: Diagnosis present

## 2023-08-25 DIAGNOSIS — K200 Eosinophilic esophagitis: Secondary | ICD-10-CM | POA: Diagnosis not present

## 2023-08-25 DIAGNOSIS — R519 Headache, unspecified: Secondary | ICD-10-CM | POA: Diagnosis present

## 2023-08-25 DIAGNOSIS — D72829 Elevated white blood cell count, unspecified: Secondary | ICD-10-CM | POA: Diagnosis not present

## 2023-08-25 DIAGNOSIS — F411 Generalized anxiety disorder: Secondary | ICD-10-CM | POA: Diagnosis present

## 2023-08-25 DIAGNOSIS — Z1152 Encounter for screening for COVID-19: Secondary | ICD-10-CM | POA: Diagnosis not present

## 2023-08-25 DIAGNOSIS — B001 Herpesviral vesicular dermatitis: Secondary | ICD-10-CM | POA: Diagnosis present

## 2023-08-25 DIAGNOSIS — N390 Urinary tract infection, site not specified: Secondary | ICD-10-CM | POA: Insufficient documentation

## 2023-08-25 DIAGNOSIS — Z8709 Personal history of other diseases of the respiratory system: Secondary | ICD-10-CM

## 2023-08-25 DIAGNOSIS — J45909 Unspecified asthma, uncomplicated: Secondary | ICD-10-CM | POA: Diagnosis not present

## 2023-08-25 DIAGNOSIS — R509 Fever, unspecified: Secondary | ICD-10-CM | POA: Insufficient documentation

## 2023-08-25 DIAGNOSIS — R6510 Systemic inflammatory response syndrome (SIRS) of non-infectious origin without acute organ dysfunction: Secondary | ICD-10-CM | POA: Insufficient documentation

## 2023-08-25 DIAGNOSIS — M069 Rheumatoid arthritis, unspecified: Secondary | ICD-10-CM | POA: Diagnosis not present

## 2023-08-25 DIAGNOSIS — M05719 Rheumatoid arthritis with rheumatoid factor of unspecified shoulder without organ or systems involvement: Secondary | ICD-10-CM | POA: Diagnosis not present

## 2023-08-25 DIAGNOSIS — Z8542 Personal history of malignant neoplasm of other parts of uterus: Secondary | ICD-10-CM

## 2023-08-25 DIAGNOSIS — J398 Other specified diseases of upper respiratory tract: Secondary | ICD-10-CM

## 2023-08-25 DIAGNOSIS — N3000 Acute cystitis without hematuria: Secondary | ICD-10-CM | POA: Diagnosis not present

## 2023-08-25 LAB — CBC WITH DIFFERENTIAL/PLATELET
Abs Immature Granulocytes: 0.04 10*3/uL (ref 0.00–0.07)
Basophils Absolute: 0 10*3/uL (ref 0.0–0.1)
Basophils Relative: 0 %
Eosinophils Absolute: 0 10*3/uL (ref 0.0–0.5)
Eosinophils Relative: 0 %
HCT: 43.4 % (ref 36.0–46.0)
Hemoglobin: 15.1 g/dL — ABNORMAL HIGH (ref 12.0–15.0)
Immature Granulocytes: 0 %
Lymphocytes Relative: 22 %
Lymphs Abs: 2.5 10*3/uL (ref 0.7–4.0)
MCH: 30.4 pg (ref 26.0–34.0)
MCHC: 34.8 g/dL (ref 30.0–36.0)
MCV: 87.5 fL (ref 80.0–100.0)
Monocytes Absolute: 0.8 10*3/uL (ref 0.1–1.0)
Monocytes Relative: 7 %
Neutro Abs: 8 10*3/uL — ABNORMAL HIGH (ref 1.7–7.7)
Neutrophils Relative %: 71 %
Platelets: 315 10*3/uL (ref 150–400)
RBC: 4.96 MIL/uL (ref 3.87–5.11)
RDW: 13.3 % (ref 11.5–15.5)
WBC: 11.4 10*3/uL — ABNORMAL HIGH (ref 4.0–10.5)
nRBC: 0 % (ref 0.0–0.2)

## 2023-08-25 LAB — CSF CELL COUNT WITH DIFFERENTIAL
Eosinophils, CSF: 0 %
Lymphs, CSF: 59 %
Monocyte-Macrophage-Spinal Fluid: 30 %
RBC Count, CSF: 122 /mm3 — ABNORMAL HIGH (ref 0–3)
RBC Count, CSF: 9 /mm3 — ABNORMAL HIGH (ref 0–3)
Segmented Neutrophils-CSF: 11 %
Tube #: 1
Tube #: 3
WBC, CSF: 2 /mm3 (ref 0–5)
WBC, CSF: 0 /mm3 (ref 0–5)

## 2023-08-25 LAB — LACTIC ACID, PLASMA
Lactic Acid, Venous: 2.3 mmol/L (ref 0.5–1.9)
Lactic Acid, Venous: 0.9 mmol/L (ref 0.5–1.9)

## 2023-08-25 LAB — COMPREHENSIVE METABOLIC PANEL
ALT: 23 U/L (ref 0–44)
AST: 33 U/L (ref 15–41)
Albumin: 4.7 g/dL (ref 3.5–5.0)
Alkaline Phosphatase: 98 U/L (ref 38–126)
Anion gap: 14 (ref 5–15)
BUN: 8 mg/dL (ref 6–20)
CO2: 23 mmol/L (ref 22–32)
Calcium: 10.3 mg/dL (ref 8.9–10.3)
Chloride: 101 mmol/L (ref 98–111)
Creatinine, Ser: 0.92 mg/dL (ref 0.44–1.00)
GFR, Estimated: >60 mL/min (ref >60)
Glucose, Bld: 140 mg/dL — ABNORMAL HIGH (ref 70–99)
Potassium: 3.1 mmol/L — ABNORMAL LOW (ref 3.5–5.1)
Sodium: 138 mmol/L (ref 135–145)
Total Bilirubin: 1 mg/dL (ref 0.3–1.2)
Total Protein: 8.5 g/dL — ABNORMAL HIGH (ref 6.5–8.1)

## 2023-08-25 LAB — URINALYSIS, ROUTINE W REFLEX MICROSCOPIC
Bacteria, UA: NONE SEEN
Bilirubin Urine: NEGATIVE
Glucose, UA: NEGATIVE mg/dL
Ketones, ur: NEGATIVE mg/dL
Nitrite: NEGATIVE
Protein, ur: 30 mg/dL — AB
Specific Gravity, Urine: 1.021 (ref 1.005–1.030)
WBC, UA: >50 WBC/hpf (ref 0–5)
pH: 5 (ref 5.0–8.0)

## 2023-08-25 LAB — PROTEIN AND GLUCOSE, CSF
Glucose, CSF: 72 mg/dL — ABNORMAL HIGH (ref 40–70)
Total  Protein, CSF: 44 mg/dL (ref 15–45)

## 2023-08-25 LAB — SARS CORONAVIRUS 2 BY RT PCR: SARS Coronavirus 2 by RT PCR: NEGATIVE

## 2023-08-25 MED ORDER — SODIUM CHLORIDE 0.9 % IV BOLUS
1000.0000 mL | Freq: Once | INTRAVENOUS | Status: AC
  Administered 2023-08-25: 1000 mL via INTRAVENOUS

## 2023-08-25 MED ORDER — PROCHLORPERAZINE EDISYLATE 10 MG/2ML IJ SOLN
10.0000 mg | Freq: Once | INTRAMUSCULAR | Status: AC
  Administered 2023-08-25: 10 mg via INTRAVENOUS
  Filled 2023-08-25: qty 2

## 2023-08-25 MED ORDER — SODIUM CHLORIDE 0.9 % IV SOLN
2.0000 g | INTRAVENOUS | Status: DC
  Administered 2023-08-25: 2 g via INTRAVENOUS
  Filled 2023-08-25: qty 20

## 2023-08-25 MED ORDER — LIDOCAINE HCL (PF) 1 % IJ SOLN
5.0000 mL | Freq: Once | INTRAMUSCULAR | Status: AC
  Administered 2023-08-25: 5 mL via INTRADERMAL
  Filled 2023-08-25: qty 5

## 2023-08-25 MED ORDER — HYDROMORPHONE HCL 1 MG/ML IJ SOLN
0.5000 mg | INTRAMUSCULAR | Status: DC | PRN
  Administered 2023-08-25 – 2023-08-26 (×4): 0.5 mg via INTRAVENOUS
  Filled 2023-08-25 (×4): qty 0.5

## 2023-08-25 MED ORDER — SODIUM CHLORIDE 0.9 % IV SOLN: INTRAVENOUS | Status: DC

## 2023-08-25 MED ORDER — MORPHINE SULFATE (PF) 4 MG/ML IV SOLN
4.0000 mg | INTRAVENOUS | Status: DC | PRN
  Administered 2023-08-25 – 2023-08-26 (×6): 4 mg via INTRAVENOUS
  Filled 2023-08-25 (×7): qty 1

## 2023-08-25 MED ORDER — HYDROMORPHONE HCL 1 MG/ML IJ SOLN
0.5000 mg | Freq: Once | INTRAMUSCULAR | Status: AC
  Administered 2023-08-25: 0.5 mg via INTRAVENOUS
  Filled 2023-08-25: qty 0.5

## 2023-08-25 MED ORDER — ONDANSETRON HCL 4 MG/2ML IJ SOLN
4.0000 mg | Freq: Once | INTRAMUSCULAR | Status: AC
  Administered 2023-08-25: 4 mg via INTRAVENOUS
  Filled 2023-08-25: qty 2

## 2023-08-25 MED ORDER — KETOROLAC TROMETHAMINE 30 MG/ML IJ SOLN
15.0000 mg | Freq: Once | INTRAMUSCULAR | Status: AC
  Administered 2023-08-25: 15 mg via INTRAVENOUS
  Filled 2023-08-25: qty 1

## 2023-08-25 NOTE — ED Triage Notes (Signed)
Pt presents to ED with c/o of migraines, HX of same. Pt states this has been ongoing since Monday. Pt sates meningitis in May. Pt denies fevers or chills.

## 2023-08-25 NOTE — Assessment & Plan Note (Signed)
Appears stable  Cont home regimen

## 2023-08-25 NOTE — Assessment & Plan Note (Signed)
UA indicative of infection w/ subacute vs. Chronic dysuria/bladder spasms  Borderline SIRS criteria  Started on IV rocephin in the ER

## 2023-08-25 NOTE — ED Notes (Signed)
Called to [facility] per [facility] for a update on pt transfer/rep:[name] Horn.

## 2023-08-25 NOTE — ED Provider Notes (Signed)
-----------------------------------------   8:21 PM on 08/25/2023 ----------------------------------------- Patient was admitted to the hospital service on my arrival however I have spoken to the hospitalist he does not feel comfortable admitting the patient locally.  He states he has read through the notes and the patient receives care at [facility] ID they are concerned for possible aseptic meningitis although the patient's CSF tap today is overall reassuring.  Hospitalist has also spoken to our local neurologist Dr. Selina Cooley and she also recommends transfer to [facility] for further workup and treatment.  Patient continues to have headache in the emergency department.  Patient's workup reassuringly shows a reassuring CBC with white blood cell count of 11,400, overall reassuring chemistry reassuring chest x-ray negative CT scan of the head.  Patient's lactic acid is slightly elevated however on recheck after IV fluids it has normalized.  CSF on tube 3 shows 9 RBCs and 0 WBCs.  I have spoken to [facility] transfer center we have patient multiple times but they are still waiting to speak to the provider to discuss further treatment.  We have contacted [facility] multiple times for the transfer.  Last spoke to them approximately 10:30 PM at which time they stated the hospitalist is aware and is attempting to speak to ID regarding the patient prior to excepting.  They will call us back shortly with her decision on whether or not they will be accepting the patient to their service.   Minna Antis, MD 08/25/23 2322

## 2023-08-25 NOTE — Assessment & Plan Note (Signed)
SIRS criteria with heart rate 100s transient respiration rate of greater than 20 White count 11.4 Lactate 2.3 Some concern for recurrence of aseptic meningitis-ruled out with normal CSF studies as well Some question of urinary source with dysuria and urinalysis being positive for leukocyte esterase Started on empiric IV Rocephin for infectious coverage in the ER

## 2023-08-25 NOTE — Consult Note (Addendum)
Initial Consultation Note   Patient: [name] Horn DOB: [birth year] PCP: [facility] DOA: 08/25/2023 DOS: the patient was seen and examined on 08/25/2023 Primary service: No att. providers found  Referring physician: Roxan Hockey MD  Reason for consult: Recurrent Headh   Assessment/Plan: Assessment and Plan: Recurrent headache Recurring episodes of migrainous headache with noted admissions in May as well as June with rule out for aseptic meningitis Similar symptom profile today CSF studies grossly negative at present Noted prior extensive workup including CTV, lupus panel and paraneoplastic panel with prior differential including aseptic meningitis-unusual latent infectious causes with waxing/waning disease activity, autoimmune process and rare known side effect of IVIG  Fairly broad differential diagnosis at present Per the patient, she was instructed to go to [facility] if symptoms were to recur by her immunologist, infectious disease as well as neurologist who are in the [facility] system.  Case discussed w/ Dr. 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 Chronic dysuria/bladder spasms  Borderline SIRS criteria  Started on IV rocephin in the ER   SIRS (systemic inflammatory response syndrome) (HCC) SIRS criteria with heart rate 100s transient respiration rate of greater than 20 White count 11.4 Lactate 2.3 Some concern for recurrence of aseptic meningitis-ruled out with normal CSF studies as well Some question of urinary source with dysuria and urinalysis being positive for leukocyte esterase Started on empiric IV Rocephin for infectious coverage in the ER   Generalized anxiety disorder On prn ativan     Herpes labialis On valtrex    Rheumatoid arthritis (HCC) Appears stable  Cont home regimen    UTERINE CANCER, HX OF s/p TAH and XRT        TR[facility] will continue to follow the patient.  HPI: [name] Horn is a 47 y.o. female with past medical history of antibody deficient syndrome on monthly IVIG, migraine, hyperlipidemia, asthma, anxiety, eosinophilic esophagitis, interstitial cystitis, lymphedema, IBS, C. difficile colitis, rheumatoid arthritis, herpes labialis, gastroparesis, left leg DVT 2011 not on anticoagulants presented with recurrent headache.  Patient reports recurring generalized headache over the past week or so.  Noted to have been admitted back in May as well as June for similar symptoms with rule out of asymptomatic status.  Right side as well as vision changes.  Positive malaise and fatigue.  During prior evaluation, some concern for IV Ig associated headache versus meningitis.  Patient reports being on IV Ig at present setting of antibody deficiency syndrome.  Sent for esophagitis.  Currently followed in the [facility] system for neurology, infectious disease as well as immunology.  No chest pain or shortness of breath.  No abdominal pain.  Mild dysuria and change in urinary frequency.  Patient reports fairly chronic bladder spasms.  Mild deviation from baseline.  No reported hematuria.  Per the patient, she was told by team at [facility] to plan for formal evaluation and admission to [facility] if headache symptoms recur. Presented to the ER Tmax 99.5, heart rate 100s, BP stable.  Satting well on room air.  White count 11.4, hemoglobin 15, platelets 315.  Lactate 2.3.  CSF studies grossly stable.  Urinalysis mildly indicative of infection.  COVID-negative. CT head WNL.   Review of Systems: As mentioned in the history  of present illness. All other systems reviewed and are negative. 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   Herpes labialis 08/12/2020   Hypokalemia 05/01/2023   IBD (inflammatory bowel disease)    Intertrigo 06/22/2020   Leukopenia 07/06/2021   Lymphedema    Malaise 12/30/2020   RA (rheumatoid arthritis) (HCC)    Rheumatoid arthritis (HCC) 08/12/2020   Transaminitis 08/12/2020   Uterine cancer [facility])    Past Surgical History:  Procedure Laterality Date   APPENDECTOMY     cheek biopsy  11/2020   CHOLECYSTECTOMY     ESOPHAGOGASTRODUODENOSCOPY N/A 04/30/2017   Procedure: ESOPHAGOGASTRODUODENOSCOPY (EGD);  Surgeon: Dorena Cookey, MD;  Location: [facility] ENDOSCOPY;  Service: Endoscopy;  Laterality: N/A;   HERNIA REPAIR     x2   KNEE SURGERY     x3   MINIMALLY INVASIVE FORAMINOTOMY CERVICAL SPINE     C6-T1, Nitka   SAVORY DILATION N/A 04/30/2017   Procedure: SAVORY DILATION;  Surgeon: Dorena Cookey, MD;  Location: [facility] ENDOSCOPY;  Service: Endoscopy;  Laterality: N/A;   TONSILLECTOMY     TOTAL ABDOMINAL HYSTERECTOMY     Sarcoma, s/p XRT   Social History:  reports that she has never smoked. She has been exposed to tobacco smoke. She has never used smokeless tobacco. She reports that she does not currently use alcohol. She reports current drug use.  Allergies  Allergen Reactions   Cyanoacrylate Hives   Other Itching and Rash    Dermabond surgical glue.  Dermabond surgical glue-blisters   Sulfa Antibiotics Anaphylaxis, Rash, Shortness Of Breath and Swelling    Angioedema (also)   Sulfonamide Derivatives Hives, Shortness Of Breath and Swelling    TONGUE SWELLS   Benadryl [Diphenhydramine Hcl] Other (See Comments)    Hyperactivity    Sulfamethoxazole     Other Reaction(s): Angioedema   Diphenhydramine    Diphenhydramine Hcl     Other Reaction(s): Other (See Comments)  HYPERACTIVITY, RESTLESS  LEG   Silicone Rash    Watch band    Family History  Problem Relation Age of Onset   Hashimoto's thyroiditis Mother    Eczema Mother    Angioedema Mother    Colonic polyp Mother    Throat cancer Father    Arrhythmia Father    Angioedema Maternal Grandfather    Hypertension Other    Hyperlipidemia Other    Colon cancer Other        grandmother    Prior to Admission medications   Medication Sig Start Date End Date Taking? Authorizing Provider  albuterol (PROVENTIL) (2.5 MG/3ML) 0.083% nebulizer solution Take 3 mLs (2.5 mg total) by nebulization every 4 (four) hours as needed for wheezing or shortness of breath. 11/20/22  Yes Pahwani, Rinka R, MD  albuterol (VENTOLIN HFA) 108 (90 Base) MCG/ACT inhaler Inhale 1 puff into the lungs every 4 (four) hours as needed. 04/12/23  Yes [provider]  amphetamine-dextroamphetamine (ADDERALL) 20 MG tablet Take 20 mg by mouth 3 (three) times daily.   Yes [provider]  atropine 1 % ophthalmic solution Place 1 drop into the left eye daily as needed (for dry eye).   Yes [provider]  baclofen (LIORESAL) 10 MG tablet Take 10 mg by mouth at bedtime. 02/28/20  Yes [provider]  Bepotastine Besilate 1.5 % SOLN Place 1 drop into both eyes 2 (two) times a day.   Yes [provider]  budesonide-formoterol (SYMBICORT) 80-4.5 MCG/ACT inhaler Inhale 2 puffs into the lungs 2 (two) times daily. Can also take Q4 hr as needed for wheezing 11/17/22  Yes Adhikari, Willia Craze, MD  cyclobenzaprine (FLEXERIL) 10 MG tablet Take 1 tablet (10 mg total) by mouth at bedtime. 11/20/22  Yes Pahwani, Rinka R, MD  cycloSPORINE (RESTASIS) 0.05 % ophthalmic emulsion Place 1 drop into both eyes 2 (two) times daily.   Yes [provider]  dicyclomine (BENTYL) 10 MG capsule Take 10 mg by mouth daily as needed for nausea/vomiting. 01/02/19  Yes [provider]  EPINEPHrine (EPIPEN 2-PAK) 0.3 mg/0.3 mL IJ SOAJ injection  Inject 0.3 mg into the muscle as needed for anaphylaxis.   Yes [provider]  famotidine (PEPCID) 40 MG tablet Take 40 mg by mouth at bedtime. 05/02/21  Yes [provider]  folic acid (FOLVITE) 1 MG tablet Take 2 tablets (2 mg total) by mouth daily. 10/30/19  Yes Deveshwar, Janalyn Rouse, MD  furosemide (LASIX) 20 MG tablet Take 60 mg by mouth 2 (two) times daily as needed for fluid or edema.   Yes [provider]  LORazepam (ATIVAN) 1 MG tablet Take 1 mg by mouth 3 (three) times daily as needed for anxiety. 03/08/20  Yes [provider]  metoCLOPramide (REGLAN) 10 MG tablet Take 10 mg by mouth 2 (two) times daily.   Yes [provider]  naloxone (NARCAN) nasal spray 4 mg/0.1 mL Place 1 spray into the nose once. 02/13/23  Yes [provider]  nystatin (MYCOSTATIN) 100000 UNIT/ML suspension Take 10 mLs (1,000,000 Units total) by mouth 3 (three) times daily. 12/07/20  Yes Randall Hiss, MD  nystatin powder Apply 1 application topically 2 (two) times daily as needed (rash).   Yes [provider]  ondansetron (ZOFRAN-ODT) 4 MG disintegrating tablet Take 1 tablet (4 mg total) by mouth every 8 (eight) hours as needed for nausea or vomiting. 06/28/22  Yes Chesley Noon, MD  oxyCODONE-acetaminophen (PERCOCET) 10-325 MG tablet Take 1 tablet by mouth every 4 (four) hours as needed for pain.   Yes [provider]  pantoprazole (PROTONIX) 40 MG tablet Take 1 tablet (40 mg total) by mouth 2 (two) times daily. 05/02/17 08/04/28 Yes Elgergawy, Leana Roe, MD  Polyethyl Glycol-Propyl Glycol (SYSTANE) 0.4-0.3 % GEL ophthalmic gel Place 1 application into both eyes in the morning and at bedtime.   Yes [provider]  Potassium Chloride ER 20 MEQ TBCR Take 20 mEq by mouth daily as needed (when taking furosemide).   Yes [provider]  pravastatin (PRAVACHOL) 40 MG tablet Take 40 mg by mouth at bedtime.   Yes [provider]   prednisoLONE acetate (PRED FORTE) 1 % ophthalmic suspension Place 1 drop into the left eye as needed (conjunctivitis).   Yes [provider]  prochlorperazine (COMPAZINE) 10 MG tablet Take 1 tablet (10 mg total) by mouth every 6 (six) hours as needed for refractory nausea / vomiting (refractory headache despite usual medications). 06/09/23  Yes Pennie Banter, DO  promethazine (PHENERGAN) 25 MG tablet Take 1 tablet (25 mg total) by mouth every 6 (six) hours as needed for nausea or vomiting. 09/25/22  Yes Shaune Pollack, MD  SUMAtriptan (IMITREX) 25 MG tablet Take  25 mg by mouth every 2 (two) hours as needed for migraine. May repeat in 2 hours if headache persists or recurs.   Yes [provider]  topiramate (TOPAMAX) 100 MG tablet Take 100 mg by mouth daily. 06/18/19  Yes [provider]  topiramate (TOPAMAX) 25 MG tablet Take 25 mg by mouth at bedtime.   Yes [provider]  triamcinolone lotion (KENALOG) 0.1 % Apply 1 application topically daily as needed (rash). 07/07/20  Yes [provider]  valACYclovir (VALTREX) 1000 MG tablet Take 1 tablet (1,000 mg total) by mouth daily. 08/12/20  Yes Daiva Eves, Lisette Grinder, MD  DUPIXENT 300 MG/2ML prefilled syringe Inject 300 mg into the skin once a week. Friday Patient not taking: Reported on 08/25/2023 05/09/23   [provider]    Physical Exam: Vitals:   08/25/23 1230 08/25/23 1300 08/25/23 1330 08/25/23 1621  BP: 111/77 101/67 110/84 115/82  Pulse: (!) 118 (!) 111 (!) 114 (!) 102  Resp: (!) 21 19 20 19   Temp:    99 F (37.2 C)  TempSrc:    Oral  SpO2: 100% 94% 93% 98%  Weight:      Height:       Physical Exam Constitutional:      Appearance: She is normal weight.  HENT:     Head: Normocephalic and atraumatic.     Nose: Nose normal.     Mouth/Throat:     Mouth: Mucous membranes are moist.  Cardiovascular:     Rate and Rhythm: Normal rate and regular rhythm.  Pulmonary:     Effort:  Pulmonary effort is normal.     Breath sounds: Normal breath sounds.  Abdominal:     General: Bowel sounds are normal.  Musculoskeletal:        General: Normal range of motion.  Skin:    General: Skin is warm.  Neurological:     General: No focal deficit present.     Comments: Nonfocal neuro exam  No nuchal rigidity No photophobia    Psychiatric:        Mood and Affect: Mood normal.     Data Reviewed:   There are no new results to review at this time.  DG Chest Portable 1 View CLINICAL DATA:  Shortness of breath.  EXAM: PORTABLE CHEST 1 VIEW  COMPARISON:  06/15/2023.  FINDINGS: Bilateral lung fields are clear. Bilateral costophrenic angles are clear.  Normal cardio-mediastinal silhouette.  No acute osseous abnormalities.  The soft tissues are within normal limits.  Redemonstration of presumed loop recorder device overlying the left middle chest wall.  IMPRESSION: No active disease.  Electronically Signed   By: Jules Schick M.D.   On: 08/25/2023 16:05 CT HEAD WO CONTRAST ( ) CLINICAL DATA:  Migraines  EXAM: CT HEAD WITHOUT CONTRAST  TECHNIQUE: Contiguous axial images were obtained from the base of the skull through the vertex without intravenous contrast.  RADIATION DOSE REDUCTION: This exam was performed according to the departmental dose-optimization program which includes automated exposure control, adjustment of the mA and/or kV according to patient size and/or use of iterative reconstruction technique.  COMPARISON:  Brain MRI 05/03/2023, CT head 06/06/2023  FINDINGS: Brain: There is no acute intracranial hemorrhage, extra-axial fluid collection, or acute infarct  Parenchymal volume is normal. The ventricles are normal in size. Gray-white differentiation is preserved. A prominent perivascular space in the left parietal white matter is unchanged.  The pituitary and suprasellar region are normal. There is no mass lesion. There  is no mass  effect or midline shift.  Vascular: No hyperdense vessel or unexpected calcification.  Skull: Normal. Negative for fracture or focal lesion.  Sinuses/Orbits: The imaged paranasal sinuses are clear. The globes and orbits are unremarkable.  Other: The mastoid air cells and middle ear cavities are clear.  IMPRESSION: No acute intracranial pathology.  Electronically Signed   By: Lesia Hausen M.D.   On: 08/25/2023 13:01  Lab Results  Component Value Date   WBC 11.4 (H) 08/25/2023   HGB 15.1 (H) 08/25/2023   HCT 43.4 08/25/2023   MCV 87.5 08/25/2023   PLT 315 08/25/2023   Last metabolic panel Lab Results  Component Value Date   GLUCOSE 140 (H) 08/25/2023   NA 138 08/25/2023   K 3.1 (L) 08/25/2023   CL 101 08/25/2023   CO2 23 08/25/2023   BUN 8 08/25/2023   CREATININE 0.92 08/25/2023   GFRNONAA >60 08/25/2023   CALCIUM 10.3 08/25/2023   PROT 8.5 (H) 08/25/2023   ALBUMIN 4.7 08/25/2023   BILITOT 1.0 08/25/2023   ALKPHOS 98 08/25/2023   AST 33 08/25/2023   ALT 23 08/25/2023   ANIONGAP 14 08/25/2023      Family Communication: No family at the bedside   Primary team communication: Plan of care discussed w/ Dr. Chester Holstein who has taken over for Dr. Roxan Hockey. Will attempt transfer to [facility] Also pending callback from on call neuro to discuss case for any further recommendations given extensive prior evaluation and [facility] team recommendation for transfer.  Thank you very much for involving [place] in the care of your patient.  Author: Floydene Flock, MD 08/25/2023 5:12 PM  For on call review [email or web address].

## 2023-08-25 NOTE — Assessment & Plan Note (Signed)
s/p TAH and XRT

## 2023-08-25 NOTE — Assessment & Plan Note (Signed)
On valtrex.  ?

## 2023-08-25 NOTE — Assessment & Plan Note (Signed)
On prn ativan

## 2023-08-25 NOTE — Assessment & Plan Note (Signed)
On IVIG Followed by [facility] immunology

## 2023-08-25 NOTE — ED Provider Notes (Signed)
[facility] Provider Note    Event Date/Time   First MD Initiated Contact with Patient 08/25/23 1009     (approximate)   History   Migraine   HPI  [name] Horn is a 47 y.o. female extensive past medical history very including history of frequent migraines on Imitrex and Topamax also with history of aseptic meningitis presents to the ER for evaluation of several days of progressive headaches photophobia.  Is having some nausea.  Feels similar to previous migraines but lasting longer and more severe.     Physical Exam   Triage Vital Signs: ED Triage Vitals  Encounter Vitals Group     BP 08/25/23 0951 (!) 112/53     Systolic BP Percentile --      Diastolic BP Percentile --      Pulse Rate 08/25/23 0951 (!) 145     Resp 08/25/23 0951 18     Temp 08/25/23 0951 99 F (37.2 C)     Temp Source 08/25/23 0951 Oral     SpO2 08/25/23 0951 98 %     Weight 08/25/23 0952 180 lb (81.6 kg)     Height 08/25/23 0952 5\' 4"  (1.626 m)     Head Circumference --      Peak Flow --      Pain Score 08/25/23 0951 8     Pain Loc --      Pain Education --      Exclude from Growth Chart --     Most recent vital signs: Vitals:   08/25/23 1300 08/25/23 1330  BP: 101/67 110/84  Pulse: (!) 111 (!) 114  Resp: 19 20  Temp:    SpO2: 94% 93%     Constitutional: Alert  Eyes: Conjunctivae are normal.  Head: Atraumatic. Nose: No congestion/rhinnorhea. Mouth/Throat: Mucous membranes are moist.   Neck: Painless ROM.  Cardiovascular:   Good peripheral circulation. Respiratory: Normal respiratory effort.  No retractions.  Gastrointestinal: Soft and nontender.  Musculoskeletal:  no deformity Neurologic:  MAE spontaneously. No gross focal neurologic deficits are appreciated.  Skin:  Skin is warm, dry and intact. No rash noted. Psychiatric: Mood and affect are normal. Speech and behavior are normal.    ED Results / Procedures / Treatments   Labs (all labs ordered  are listed, but only abnormal results are displayed) Labs Reviewed  CBC WITH DIFFERENTIAL/PLATELET - Abnormal; Notable for the following components:      Result Value   WBC 11.4 (*)    Hemoglobin 15.1 (*)    Neutro Abs 8.0 (*)    All other components within normal limits  COMPREHENSIVE METABOLIC PANEL - Abnormal; Notable for the following components:   Potassium 3.1 (*)    Glucose, Bld 140 (*)    Total Protein 8.5 (*)    All other components within normal limits  URINALYSIS, ROUTINE W REFLEX MICROSCOPIC - Abnormal; Notable for the following components:   Color, Urine YELLOW (*)    APPearance HAZY (*)    Hgb urine dipstick SMALL (*)    Protein, ur 30 (*)    Leukocytes,Ua LARGE (*)    All other components within normal limits  CSF CELL COUNT WITH DIFFERENTIAL - Abnormal; Notable for the following components:   RBC Count, CSF 122 (*)    All other components within normal limits  CSF CELL COUNT WITH DIFFERENTIAL - Abnormal; Notable for the following components:   RBC Count, CSF 9 (*)    All other  components within normal limits  SARS CORONAVIRUS 2 BY RT PCR  CULTURE, BLOOD (ROUTINE X 2)  CULTURE, BLOOD (ROUTINE X 2)     EKG ED ECG REPORT I, Willy Eddy, the attending physician, personally viewed and interpreted this ECG.   Date: 08/25/2023  EKG Time: 10:01  Rate: 130  Rhythm: sinus  Axis: left  Intervals: normal  ST&T Change: no stemi, no depressions     RADIOLOGY Please see ED Course for my review and interpretation.  I personally reviewed all radiographic images ordered to evaluate for the above acute complaints and reviewed radiology reports and findings.  These findings were personally discussed with the patient.  Please see medical record for radiology report.    PROCEDURES:  Critical Care performed: No  .Lumbar Puncture  Date/Time: 08/25/2023 2:06 PM  Performed by: Willy Eddy, MD Authorized by: Willy Eddy, MD   Anesthesia:     Anesthesia method:  Local infiltration   Local anesthetic:  Lidocaine 1% w/o epi Procedure details:    Lumbar space:  L3-L4 interspace   Patient position:  Sitting   Number of attempts:  1   Fluid appearance:  Clear   Tubes of fluid:  4   Total volume (ml):  4 Post-procedure details:    Puncture site:  Adhesive bandage applied   Procedure completion:  Tolerated    MEDICATIONS ORDERED IN ED: Medications  morphine (PF) 4 MG/ML injection 4 mg (4 mg Intravenous Given 08/25/23 1338)  HYDROmorphone (DILAUDID) injection 0.5 mg (0.5 mg Intravenous Given 08/25/23 1238)  cefTRIAXone (ROCEPHIN) 2 g in sodium chloride 0.9 % 100 mL IVPB (has no administration in time range)  sodium chloride 0.9 % bolus 1,000 mL (0 mLs Intravenous Stopped 08/25/23 1340)  prochlorperazine (COMPAZINE) injection 10 mg (10 mg Intravenous Given 08/25/23 1049)  ketorolac (TORADOL) 30 MG/ML injection 15 mg (15 mg Intravenous Given 08/25/23 1140)  lidocaine (PF) (XYLOCAINE) 1 % injection 5 mL (5 mLs Intradermal Given by Other 08/25/23 1426)     IMPRESSION / MDM / ASSESSMENT AND PLAN / ED COURSE  I reviewed the triage vital signs and the nursing notes.                              Differential diagnosis includes, but is not limited to, migraine, tension, COVID, URI, meningitis  Patient presenting to the ER for evaluation of symptoms as described above.  Based on symptoms, risk factors and considered above differential, this presenting complaint could reflect a potentially life-threatening illness therefore the patient will be placed on continuous pulse oximetry and telemetry for monitoring.  Laboratory evaluation will be sent to evaluate for the above complaints.      Clinical Course as of 08/25/23 1541  Fri Aug 25, 2023  1326 Patient with persistent low-grade temperature tachycardia with persistent headache.  Given her history of immunocompromise state will perform LP. [PR]  1539 CSF and analysis without evidence of  meningitis.  Given her fevers tachycardia leukocytosis in the setting of immunocompromise state will give Rocephin for possible UTI fever of uncertain origin.  Will consult hospitalist for admission. [PR]    Clinical Course User Index [PR] Willy Eddy, MD     FINAL CLINICAL IMPRESSION(S) / ED DIAGNOSES   Final diagnoses:  Bad headache  Fever, unspecified fever cause     Rx / DC Orders   ED Discharge Orders     None  Note:  This document was prepared using Dragon voice recognition software and may include unintentional dictation errors.    Willy Eddy, MD 08/25/23 [phone]

## 2023-08-25 NOTE — Assessment & Plan Note (Addendum)
Recurring episodes of migrainous headache with noted admissions in May as well as June with rule out for aseptic meningitis Similar symptom profile today CSF studies grossly negative at present Noted prior extensive workup including CTV, lupus panel and paraneoplastic panel with prior differential including aseptic meningitis-unusual latent infectious causes with waxing/waning disease activity, autoimmune process and rare known side effect of IVIG  Fairly broad differential diagnosis at present Per the patient, she was instructed to go to [facility] if symptoms were to recur by her immunologist, infectious disease as well as neurologist who are in the [facility] system.  Case discussed w/ Dr. Selina Cooley w/ neurology- feels transfer to [facility] most appropriate for evaluation

## 2023-08-25 NOTE — ED Notes (Signed)
[facility]  Transfer  center  called  per  Dr  Lenard Lance  MD

## 2023-08-25 NOTE — Assessment & Plan Note (Signed)
Stable from a resp standpoint  Cont home inhalers   

## 2023-08-25 NOTE — ED Notes (Signed)
[name] Horn  with  [facility]  hospital

## 2023-08-26 DIAGNOSIS — N3000 Acute cystitis without hematuria: Secondary | ICD-10-CM

## 2023-08-26 DIAGNOSIS — J45909 Unspecified asthma, uncomplicated: Secondary | ICD-10-CM | POA: Diagnosis not present

## 2023-08-26 DIAGNOSIS — D72829 Elevated white blood cell count, unspecified: Secondary | ICD-10-CM | POA: Diagnosis not present

## 2023-08-26 DIAGNOSIS — Z1152 Encounter for screening for COVID-19: Secondary | ICD-10-CM | POA: Diagnosis not present

## 2023-08-26 DIAGNOSIS — M05719 Rheumatoid arthritis with rheumatoid factor of unspecified shoulder without organ or systems involvement: Secondary | ICD-10-CM

## 2023-08-26 DIAGNOSIS — R509 Fever, unspecified: Secondary | ICD-10-CM | POA: Diagnosis not present

## 2023-08-26 DIAGNOSIS — Z8542 Personal history of malignant neoplasm of other parts of uterus: Secondary | ICD-10-CM | POA: Diagnosis not present

## 2023-08-26 DIAGNOSIS — R519 Headache, unspecified: Secondary | ICD-10-CM | POA: Diagnosis present

## 2023-08-26 MED ORDER — POTASSIUM CHLORIDE CRYS ER 20 MEQ PO TBCR
40.0000 meq | EXTENDED_RELEASE_TABLET | Freq: Once | ORAL | Status: AC
  Administered 2023-08-26: 40 meq via ORAL
  Filled 2023-08-26: qty 2

## 2023-08-26 MED ORDER — FOLIC ACID 1 MG PO TABS
2.0000 mg | ORAL_TABLET | Freq: Every day | ORAL | Status: DC
  Administered 2023-08-26: 2 mg via ORAL
  Filled 2023-08-26: qty 2

## 2023-08-26 MED ORDER — AMPHETAMINE-DEXTROAMPHETAMINE 10 MG PO TABS
20.0000 mg | ORAL_TABLET | Freq: Three times a day (TID) | ORAL | Status: DC
  Administered 2023-08-26: 20 mg via ORAL
  Filled 2023-08-26: qty 2

## 2023-08-26 MED ORDER — METOCLOPRAMIDE HCL 10 MG PO TABS
10.0000 mg | ORAL_TABLET | Freq: Two times a day (BID) | ORAL | Status: DC
  Administered 2023-08-26: 10 mg via ORAL
  Filled 2023-08-26: qty 1

## 2023-08-26 MED ORDER — PANTOPRAZOLE SODIUM 40 MG PO TBEC
40.0000 mg | DELAYED_RELEASE_TABLET | Freq: Two times a day (BID) | ORAL | Status: DC
  Administered 2023-08-26: 40 mg via ORAL
  Filled 2023-08-26: qty 1

## 2023-08-26 MED ORDER — LORAZEPAM 1 MG PO TABS
1.0000 mg | ORAL_TABLET | Freq: Three times a day (TID) | ORAL | Status: DC | PRN
  Administered 2023-08-26: 1 mg via ORAL
  Filled 2023-08-26: qty 1

## 2023-08-26 MED ORDER — TOPIRAMATE 25 MG PO TABS: 25.0000 mg | ORAL_TABLET | Freq: Every day | ORAL | Status: DC

## 2023-08-26 MED ORDER — FAMOTIDINE 20 MG PO TABS: 40.0000 mg | ORAL_TABLET | Freq: Every day | ORAL | Status: DC

## 2023-08-26 MED ORDER — DICYCLOMINE HCL 10 MG PO CAPS: 10.0000 mg | ORAL_CAPSULE | Freq: Every day | ORAL | Status: DC | PRN

## 2023-08-26 MED ORDER — ONDANSETRON HCL 4 MG/2ML IJ SOLN: 4.0000 mg | Freq: Four times a day (QID) | INTRAMUSCULAR | Status: DC | PRN

## 2023-08-26 MED ORDER — PROCHLORPERAZINE MALEATE 10 MG PO TABS: 10.0000 mg | ORAL_TABLET | Freq: Four times a day (QID) | ORAL | Status: DC | PRN

## 2023-08-26 MED ORDER — MOMETASONE FURO-FORMOTEROL FUM 100-5 MCG/ACT IN AERO
2.0000 | INHALATION_SPRAY | Freq: Two times a day (BID) | RESPIRATORY_TRACT | Status: DC
  Administered 2023-08-26: 2 via RESPIRATORY_TRACT
  Filled 2023-08-26: qty 8.8

## 2023-08-26 MED ORDER — ALBUTEROL SULFATE (2.5 MG/3ML) 0.083% IN NEBU: 2.5000 mg | INHALATION_SOLUTION | RESPIRATORY_TRACT | Status: DC | PRN

## 2023-08-26 MED ORDER — VALACYCLOVIR HCL 500 MG PO TABS
1000.0000 mg | ORAL_TABLET | Freq: Every day | ORAL | Status: DC
  Administered 2023-08-26: 1000 mg via ORAL
  Filled 2023-08-26: qty 2

## 2023-08-26 MED ORDER — TOPIRAMATE 25 MG PO TABS
100.0000 mg | ORAL_TABLET | Freq: Every day | ORAL | Status: DC
  Administered 2023-08-26: 100 mg via ORAL
  Filled 2023-08-26: qty 4

## 2023-08-26 NOTE — Progress Notes (Signed)
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  She was admitted for the same in May and June of this year.  The patient reported right-sided weakness as well as visual change with malaise and fatigue.  During previous workups there has been concern for IVIG associated headache versus a true meningitis.  She receives her Neurology immunology and ID care at [facility].  Per the patient she was instructed by her care team at [facility] to plan for inpatient admission with a formal evaluation the next time her headache recurred.  In the ER CT head was without acute finding and CSF studies appear grossly normal initially.  Interim Hx: No acute events reported overnight.  Afebrile.  Mild sinus tachycardia persists with heart rate 98-1 05.  Blood pressure stable.  Saturation 95% room air.  The patient was seen for a follow-up visit.  She reports no significant change in her headache symptoms but denies development of any new symptoms.  She states she does have persisting nausea with her headache and asked that her usual nausea medication regimen be resumed.  She denies acute visual change shortness of breath chest pain or diarrhea.  Assessment & Recommendations:  Recurrent idiopathic headache CSF studies this presentation unremarkable -has undergone prior extensive workup with differential to include aseptic meningitis, unusual latent infectious causes, autoimmune process, or rare side effect of IVIG - per the patient she was instructed to present to [facility] if her symptoms recurred - our team  has discussed the patient's care with the Neurology team at [facility] and all parties agree that transfer to [facility] for ongoing workup of this multifactorial complex problem is in the patient's best interest - the ED team has contacted [facility] who has agreed to accept the patient in transfer, and we are presently awaiting a bed -no other changes recommended from a medical consultative standpoint at this time - we will continue to follow in a supportive consultative role while she remains in the [facility] ED  Antibody deficiency syndrome On chronic IVIG per [facility] immunology department  Asthma Well compensated at present  Abnormal UA Given patient's history the significance of her abnormal UA in the absence of clear symptoms of a UTI is not entirely clear -agree in this setting with empiric antibiotic therapy  Generalized anxiety disorder Appears well-controlled at present  Herpes labialis Continue Valtrex  RA No evidence of acute flare at present  Mild hypokalemia Supplement   Family Communication: No family present at time of exam  Objective: Blood pressure (!) 96/58, pulse (!) 102, temperature 98.5 F (36.9 C), temperature source Oral, resp. rate (!) 21, height 5\' 4"  (1.626 m), weight 81.6 kg, SpO2 93%. No intake or output data in the 24 hours ending 08/26/23 0910 Filed Weights   08/25/23 0952  Weight: 81.6 kg    Examination: General: No acute respiratory distress Lungs: Clear to auscultation bilaterally without wheezes or crackles Cardiovascular: Regular rate and rhythm without murmur gallop or rub normal S1 and S2 Abdomen: Nontender, nondistended, soft, bowel sounds positive, no rebound, no ascites, no appreciable mass Extremities: No significant cyanosis, clubbing, or edema bilateral lower extremities  CBC: Recent Labs  Lab 08/25/23 0954  WBC 11.4*  NEUTROABS 8.0*  HGB 15.1*  HCT 43.4  MCV 87.5  PLT 315   Basic Metabolic Panel: Recent Labs  Lab 08/25/23 0954  NA 138  K  3.1*  CL 101  CO2 23  GLUCOSE 140*  BUN 8  CREATININE 0.92  CALCIUM 10.3   GFR: Estimated Creatinine Clearance: 77.3 mL/min (by C-G formula based on SCr of 0.92 mg/dL).   Scheduled Meds: Continuous Infusions:  sodium chloride 100 mL/hr at 08/26/23 0418   cefTRIAXone (ROCEPHIN)  IV Stopped (08/25/23 1720)     LOS: 0 days   Lonia Blood, MD [facility] Office  [phone] Pager - Text Page per Amion  If 7PM-7AM, please contact night-coverage per Amion 08/26/2023, 9:10 AM

## 2023-08-26 NOTE — ED Notes (Signed)
called to [facility] for info regarding pt/[facility] was [facility]/accepting is shelia sherzoy/rep:samantha.

## 2023-08-26 NOTE — ED Provider Notes (Signed)
-----------------------------------------   12:50 AM on 08/26/2023 -----------------------------------------   Spoke with Dr.Sherzoy from [facility] who accepts patient for transfer.  She has been added to the wait list.  Remains hemodynamically stable.  Patient updated.  She is sitting up, resting, eating crackers.  ----------------------------------------- 6:43 AM on 08/26/2023 -----------------------------------------   No events overnight.  Remains on the waiting list for transfer to [facility].   Irean Hong, MD 08/26/23 [phone]

## 2023-08-26 NOTE — ED Notes (Signed)
called to [facility] transfer for update/pt is still accepted to [facility]/no time frame on when pt will have a bed assignment per ZOX:WRUEAV.

## 2023-08-26 NOTE — ED Notes (Signed)
EMTALA Reviewed by this RN.  

## 2023-08-30 LAB — CULTURE, BLOOD (ROUTINE X 2)
Culture: NO GROWTH
Culture: NO GROWTH

## 2023-09-04 ENCOUNTER — Telehealth: Payer: Self-pay | Admitting: Rheumatology

## 2023-09-04 NOTE — Telephone Encounter (Signed)
Patient called checking the status of her order to restart IV IG.  Patient states Dr. Dan Europe and/or Dr. Renee Harder from [facility] needed a [facility] physician to put the order in and were going to message Dr. Corliss Skains.

## 2023-09-05 NOTE — Telephone Encounter (Signed)
Patient advised we do not give IVIG infusions. She will have to follow-up with the previous physician who is giving IVIG. Patient expressed understanding.

## 2023-09-05 NOTE — Telephone Encounter (Signed)
We do not give IVIG infusions.  She will have to follow-up with the previous physician who is giving IVIG.

## 2023-09-07 ENCOUNTER — Emergency Department
Admission: EM | Admit: 2023-09-07 | Discharge: 2023-09-07 | Disposition: A | Discharge status: Home / Self Care | Payer: Medicaid Other | Attending: Student in an Organized Health Care Education/Training Program | Admitting: Student in an Organized Health Care Education/Training Program

## 2023-09-07 ENCOUNTER — Other Ambulatory Visit: Payer: Self-pay

## 2023-09-07 ENCOUNTER — Encounter: Payer: Self-pay | Admitting: *Deleted

## 2023-09-07 DIAGNOSIS — D72829 Elevated white blood cell count, unspecified: Secondary | ICD-10-CM | POA: Insufficient documentation

## 2023-09-07 DIAGNOSIS — N3000 Acute cystitis without hematuria: Secondary | ICD-10-CM | POA: Diagnosis not present

## 2023-09-07 DIAGNOSIS — E876 Hypokalemia: Secondary | ICD-10-CM | POA: Diagnosis not present

## 2023-09-07 DIAGNOSIS — R300 Dysuria: Secondary | ICD-10-CM | POA: Diagnosis present

## 2023-09-07 LAB — URINALYSIS, ROUTINE W REFLEX MICROSCOPIC
Bilirubin Urine: NEGATIVE
Glucose, UA: NEGATIVE mg/dL
Hgb urine dipstick: NEGATIVE
Ketones, ur: NEGATIVE mg/dL
Nitrite: NEGATIVE
Protein, ur: NEGATIVE mg/dL
Specific Gravity, Urine: 1.015 (ref 1.005–1.030)
pH: 5 (ref 5.0–8.0)

## 2023-09-07 LAB — BASIC METABOLIC PANEL
Anion gap: 13 (ref 5–15)
BUN: 8 mg/dL (ref 6–20)
CO2: 24 mmol/L (ref 22–32)
Calcium: 8.9 mg/dL (ref 8.9–10.3)
Chloride: 96 mmol/L — ABNORMAL LOW (ref 98–111)
Creatinine, Ser: 0.95 mg/dL (ref 0.44–1.00)
GFR, Estimated: >60 mL/min (ref >60)
Glucose, Bld: 123 mg/dL — ABNORMAL HIGH (ref 70–99)
Potassium: 3.3 mmol/L — ABNORMAL LOW (ref 3.5–5.1)
Sodium: 133 mmol/L — ABNORMAL LOW (ref 135–145)

## 2023-09-07 LAB — CBC
HCT: 35 % — ABNORMAL LOW (ref 36.0–46.0)
Hemoglobin: 12.1 g/dL (ref 12.0–15.0)
MCH: 30.6 pg (ref 26.0–34.0)
MCHC: 34.6 g/dL (ref 30.0–36.0)
MCV: 88.6 fL (ref 80.0–100.0)
Platelets: 300 10*3/uL (ref 150–400)
RBC: 3.95 MIL/uL (ref 3.87–5.11)
RDW: 12.7 % (ref 11.5–15.5)
WBC: 12 10*3/uL — ABNORMAL HIGH (ref 4.0–10.5)
nRBC: 0 % (ref 0.0–0.2)

## 2023-09-07 LAB — POC URINE PREG, ED: Preg Test, Ur: NEGATIVE

## 2023-09-07 MED ORDER — CEPHALEXIN 500 MG PO CAPS
500.0000 mg | ORAL_CAPSULE | Freq: Once | ORAL | Status: AC
  Administered 2023-09-07: 500 mg via ORAL
  Filled 2023-09-07: qty 1

## 2023-09-07 MED ORDER — CEPHALEXIN 500 MG PO CAPS
500.0000 mg | ORAL_CAPSULE | Freq: Four times a day (QID) | ORAL | 0 refills | Status: DC
Start: 2023-09-07 — End: 2023-09-21

## 2023-09-07 MED ORDER — PROCHLORPERAZINE MALEATE 10 MG PO TABS
10.0000 mg | ORAL_TABLET | Freq: Once | ORAL | Status: AC
  Administered 2023-09-07: 10 mg via ORAL
  Filled 2023-09-07: qty 1

## 2023-09-07 MED ORDER — PROCHLORPERAZINE MALEATE 10 MG PO TABS: 10.0000 mg | ORAL_TABLET | Freq: Four times a day (QID) | ORAL | 0 refills | Status: DC | PRN

## 2023-09-07 MED ORDER — ONDANSETRON HCL 4 MG PO TABS
4.0000 mg | ORAL_TABLET | Freq: Once | ORAL | Status: AC
  Administered 2023-09-07: 4 mg via ORAL

## 2023-09-07 NOTE — ED Provider Notes (Signed)
[place] EMERGENCY DEPARTMENT AT [facility] Provider Note   CSN: [id number] Arrival date & time: 09/07/23  1526     History  Chief Complaint  Patient presents with   Dysuria   Flank Pain    [name] Horn is a 47 y.o. female presents to the emergency department valuation of lower abdominal pain, bladder spasms, painful urination and right-sided flank pain.  Symptoms have been present for 2 days.  She denies any vomiting but has had mild nausea.  She has had some chills and suspected fever.  HPI     Home Medications Prior to Admission medications   Medication Sig Start Date End Date Taking? Authorizing Provider  cephALEXin (KEFLEX) 500 MG capsule Take 1 capsule (500 mg total) by mouth 4 (four) times daily for 10 days. 09/07/23 09/17/23 Yes Evon Slack, PA-C  prochlorperazine (COMPAZINE) 10 MG tablet Take 1 tablet (10 mg total) by mouth every 6 (six) hours as needed for nausea or vomiting. 09/07/23  Yes Evon Slack, PA-C  albuterol (PROVENTIL) (2.5 MG/3ML) 0.083% nebulizer solution Take 3 mLs (2.5 mg total) by nebulization every 4 (four) hours as needed for wheezing or shortness of breath. 11/20/22   Pahwani, Kasandra Knudsen, MD  albuterol (VENTOLIN HFA) 108 (90 Base) MCG/ACT inhaler Inhale 1 puff into the lungs every 4 (four) hours as needed. 04/12/23   [provider]  amphetamine-dextroamphetamine (ADDERALL) 20 MG tablet Take 20 mg by mouth 3 (three) times daily.    [provider]  atropine 1 % ophthalmic solution Place 1 drop into the left eye daily as needed (for dry eye).    [provider]  baclofen (LIORESAL) 10 MG tablet Take 10 mg by mouth at bedtime. 02/28/20   [provider]  Bepotastine Besilate 1.5 % SOLN Place 1 drop into both eyes 2 (two) times a day.    [provider]  budesonide-formoterol (SYMBICORT) 80-4.5 MCG/ACT inhaler Inhale 2 puffs into the lungs 2 (two) times daily. Can also take Q4 hr as needed for  wheezing 11/17/22   Burnadette Pop, MD  cyclobenzaprine (FLEXERIL) 10 MG tablet Take 1 tablet (10 mg total) by mouth at bedtime. 11/20/22   Pahwani, Kasandra Knudsen, MD  cycloSPORINE (RESTASIS) 0.05 % ophthalmic emulsion Place 1 drop into both eyes 2 (two) times daily.    [provider]  dicyclomine (BENTYL) 10 MG capsule Take 10 mg by mouth daily as needed for nausea/vomiting. 01/02/19   [provider]  EPINEPHrine (EPIPEN 2-PAK) 0.3 mg/0.3 mL IJ SOAJ injection Inject 0.3 mg into the muscle as needed for anaphylaxis.    [provider]  famotidine (PEPCID) 40 MG tablet Take 40 mg by mouth at bedtime. 05/02/21   [provider]  folic acid (FOLVITE) 1 MG tablet Take 2 tablets (2 mg total) by mouth daily. 10/30/19   Pollyann Savoy, MD  furosemide (LASIX) 20 MG tablet Take 60 mg by mouth 2 (two) times daily as needed for fluid or edema.    [provider]  LORazepam (ATIVAN) 1 MG tablet Take 1 mg by mouth 3 (three) times daily as needed for anxiety. 03/08/20   [provider]  metoCLOPramide (REGLAN) 10 MG tablet Take 10 mg by mouth 2 (two) times daily.    [provider]  naloxone [facility]) nasal spray 4 mg/0.1 mL Place 1 spray into the nose once. 02/13/23   [provider]  nystatin (MYCOSTATIN) 100000 UNIT/ML suspension Take 10 mLs (1,000,000 Units total)  by mouth 3 (three) times daily. 12/07/20   Randall Hiss, MD  nystatin powder Apply 1 application topically 2 (two) times daily as needed (rash).    [provider]  ondansetron (ZOFRAN-ODT) 4 MG disintegrating tablet Take 1 tablet (4 mg total) by mouth every 8 (eight) hours as needed for nausea or vomiting. 06/28/22   Chesley Noon, MD  oxyCODONE-acetaminophen (PERCOCET) 10-325 MG tablet Take 1 tablet by mouth every 4 (four) hours as needed for pain.    [provider]  pantoprazole (PROTONIX) 40 MG tablet Take 1 tablet (40 mg total) by mouth 2 (two) times daily.  05/02/17 08/04/28  Elgergawy, Leana Roe, MD  Polyethyl Glycol-Propyl Glycol (SYSTANE) 0.4-0.3 % GEL ophthalmic gel Place 1 application into both eyes in the morning and at bedtime.    [provider]  Potassium Chloride ER 20 MEQ TBCR Take 20 mEq by mouth daily as needed (when taking furosemide).    [provider]  pravastatin (PRAVACHOL) 40 MG tablet Take 40 mg by mouth at bedtime.    [provider]  prednisoLONE acetate (PRED FORTE) 1 % ophthalmic suspension Place 1 drop into the left eye as needed (conjunctivitis).    [provider]  promethazine (PHENERGAN) 25 MG tablet Take 1 tablet (25 mg total) by mouth every 6 (six) hours as needed for nausea or vomiting. 09/25/22   Shaune Pollack, MD  SUMAtriptan (IMITREX) 25 MG tablet Take 25 mg by mouth every 2 (two) hours as needed for migraine. May repeat in 2 hours if headache persists or recurs.    [provider]  topiramate (TOPAMAX) 100 MG tablet Take 100 mg by mouth daily. 06/18/19   [provider]  topiramate (TOPAMAX) 25 MG tablet Take 25 mg by mouth at bedtime.    [provider]  triamcinolone lotion (KENALOG) 0.1 % Apply 1 application topically daily as needed (rash). 07/07/20   [provider]  valACYclovir (VALTREX) 1000 MG tablet Take 1 tablet (1,000 mg total) by mouth daily. 08/12/20   Randall Hiss, MD      Allergies    Cyanoacrylate, Other, Sulfa antibiotics, Sulfonamide derivatives, Benadryl [diphenhydramine hcl], Sulfamethoxazole, Diphenhydramine, Diphenhydramine hcl, and Silicone    Review of Systems   Review of Systems  Physical Exam Updated Vital Signs BP 95/72 (BP Location: Left Arm)   Pulse 75   Temp 98.9 F (37.2 C) (Oral)   Resp 20   Ht 5\' 4"  (1.626 m)   Wt 83.9 kg   SpO2 100%   BMI 31.76 kg/m  Physical Exam Constitutional:      Appearance: She is well-developed.  HENT:     Head: Normocephalic and atraumatic.  Eyes:      Conjunctiva/sclera: Conjunctivae normal.  Cardiovascular:     Rate and Rhythm: Normal rate and regular rhythm.     Pulses: Normal pulses.     Heart sounds: Normal heart sounds.  Pulmonary:     Effort: Pulmonary effort is normal. No respiratory distress.  Abdominal:     General: Abdomen is flat. There is no distension.     Palpations: Abdomen is soft.     Tenderness: There is no abdominal tenderness. There is no right CVA tenderness, left CVA tenderness or guarding.  Musculoskeletal:        General: Normal range of motion.     Cervical back: Normal range of motion.  Skin:    General: Skin is warm.     Findings: No  rash.  Neurological:     General: No focal deficit present.     Mental Status: She is alert and oriented to person, place, and time.  Psychiatric:        Behavior: Behavior normal.        Thought Content: Thought content normal.     ED Results / Procedures / Treatments   Labs (all labs ordered are listed, but only abnormal results are displayed) Labs Reviewed  URINALYSIS, ROUTINE W REFLEX MICROSCOPIC - Abnormal; Notable for the following components:      Result Value   Color, Urine YELLOW (*)    APPearance CLEAR (*)    Leukocytes,Ua SMALL (*)    Bacteria, UA RARE (*)    Non Squamous Epithelial PRESENT (*)    All other components within normal limits  BASIC METABOLIC PANEL - Abnormal; Notable for the following components:   Sodium 133 (*)    Potassium 3.3 (*)    Chloride 96 (*)    Glucose, Bld 123 (*)    All other components within normal limits  CBC - Abnormal; Notable for the following components:   WBC 12.0 (*)    HCT 35.0 (*)    All other components within normal limits  URINE CULTURE  POC URINE PREG, ED    EKG None  Radiology No results found.  Procedures Procedures    Medications Ordered in ED Medications  prochlorperazine (COMPAZINE) tablet 10 mg (has no administration in time range)  ondansetron (ZOFRAN) tablet 4 mg (4 mg Oral Given  09/07/23 1754)  cephALEXin (KEFLEX) capsule 500 mg (500 mg Oral Given 09/07/23 1838)    ED Course/ Medical Decision Making/ A&P                                 Medical Decision Making Amount and/or Complexity of Data Reviewed Labs: ordered.  Risk Prescription drug management.   47 year old female with UTI.  Her vital signs are stable, afebrile nontachycardic.  She has been having slight nausea, dysuria and bladder spasms.  Urinalysis consistent with UTI.  CBC shows slightly elevated white count, BMP within normal limits except for slightly low potassium of 3.3.  She has oral potassium supplements at home that she will continue with.  Cultures pending.  Nausea resolved with Compazine.  She is started on cephalexin.  She appears stable and ready for discharge to home.  She is given strict return precautions and understand signs symptoms to return to the ER for. Final Clinical Impression(s) / ED Diagnoses Final diagnoses:  Acute cystitis without hematuria    Rx / DC Orders ED Discharge Orders          Ordered    cephALEXin (KEFLEX) 500 MG capsule  4 times daily        09/07/23 1841    prochlorperazine (COMPAZINE) 10 MG tablet  Every 6 hours PRN        09/07/23 1841              Evon Slack, PA-C 09/07/23 1844    Willy Eddy, MD 09/07/23 1850

## 2023-09-07 NOTE — ED Notes (Signed)
Patient verbalizes understanding of discharge instructions. Opportunity for questioning and answers were provided. Armband removed by staff, pt discharged from ED. Ambulated out to lobby  

## 2023-09-07 NOTE — ED Notes (Addendum)
See triage note  Presents with right flank pain and diff with urination    Also states she has had some fever at home  Afebrile on arrival

## 2023-09-07 NOTE — Discharge Instructions (Signed)
Please take antibiotics as prescribed.  Make sure you are drinking lots of fluids.  Take Compazine as needed for any nausea.  Return to the ER for any fevers, increasing pain worsening symptoms or any urgent changes in your health

## 2023-09-07 NOTE — ED Triage Notes (Signed)
Pt reports right flank pain.  Pt has dysuria and also reports fevers.  Pt has n/v  taking phenergan supp with some relief.  Pt alert  speech clear.

## 2023-09-08 LAB — URINE CULTURE: Culture: <10000 — AB

## 2023-09-10 ENCOUNTER — Other Ambulatory Visit: Payer: Self-pay

## 2023-09-10 ENCOUNTER — Emergency Department: Payer: Medicaid Other

## 2023-09-10 DIAGNOSIS — I472 Ventricular tachycardia, unspecified: Secondary | ICD-10-CM | POA: Diagnosis present

## 2023-09-10 DIAGNOSIS — Z8616 Personal history of COVID-19: Secondary | ICD-10-CM

## 2023-09-10 DIAGNOSIS — K3184 Gastroparesis: Secondary | ICD-10-CM | POA: Diagnosis present

## 2023-09-10 DIAGNOSIS — G43909 Migraine, unspecified, not intractable, without status migrainosus: Secondary | ICD-10-CM | POA: Diagnosis present

## 2023-09-10 DIAGNOSIS — M069 Rheumatoid arthritis, unspecified: Secondary | ICD-10-CM | POA: Diagnosis present

## 2023-09-10 DIAGNOSIS — Z888 Allergy status to other drugs, medicaments and biological substances status: Secondary | ICD-10-CM

## 2023-09-10 DIAGNOSIS — N390 Urinary tract infection, site not specified: Secondary | ICD-10-CM | POA: Diagnosis present

## 2023-09-10 DIAGNOSIS — Z8542 Personal history of malignant neoplasm of other parts of uterus: Secondary | ICD-10-CM

## 2023-09-10 DIAGNOSIS — F419 Anxiety disorder, unspecified: Secondary | ICD-10-CM | POA: Diagnosis present

## 2023-09-10 DIAGNOSIS — K200 Eosinophilic esophagitis: Secondary | ICD-10-CM | POA: Diagnosis present

## 2023-09-10 DIAGNOSIS — Z8619 Personal history of other infectious and parasitic diseases: Secondary | ICD-10-CM

## 2023-09-10 DIAGNOSIS — Z83719 Family history of colon polyps, unspecified: Secondary | ICD-10-CM

## 2023-09-10 DIAGNOSIS — Z808 Family history of malignant neoplasm of other organs or systems: Secondary | ICD-10-CM

## 2023-09-10 DIAGNOSIS — Z79899 Other long term (current) drug therapy: Secondary | ICD-10-CM

## 2023-09-10 DIAGNOSIS — E669 Obesity, unspecified: Secondary | ICD-10-CM | POA: Diagnosis present

## 2023-09-10 DIAGNOSIS — Z800 Family history of malignant neoplasm of digestive organs: Secondary | ICD-10-CM

## 2023-09-10 DIAGNOSIS — D809 Immunodeficiency with predominantly antibody defects, unspecified: Secondary | ICD-10-CM | POA: Diagnosis present

## 2023-09-10 DIAGNOSIS — I4719 Other supraventricular tachycardia: Secondary | ICD-10-CM | POA: Diagnosis present

## 2023-09-10 DIAGNOSIS — Y842 Radiological procedure and radiotherapy as the cause of abnormal reaction of the patient, or of later complication, without mention of misadventure at the time of the procedure: Secondary | ICD-10-CM | POA: Diagnosis present

## 2023-09-10 DIAGNOSIS — E876 Hypokalemia: Secondary | ICD-10-CM | POA: Diagnosis present

## 2023-09-10 DIAGNOSIS — K627 Radiation proctitis: Secondary | ICD-10-CM | POA: Diagnosis present

## 2023-09-10 DIAGNOSIS — Z8249 Family history of ischemic heart disease and other diseases of the circulatory system: Secondary | ICD-10-CM

## 2023-09-10 DIAGNOSIS — Z90710 Acquired absence of both cervix and uterus: Secondary | ICD-10-CM

## 2023-09-10 DIAGNOSIS — Z86718 Personal history of other venous thrombosis and embolism: Secondary | ICD-10-CM

## 2023-09-10 DIAGNOSIS — J45909 Unspecified asthma, uncomplicated: Secondary | ICD-10-CM | POA: Diagnosis present

## 2023-09-10 DIAGNOSIS — K5289 Other specified noninfective gastroenteritis and colitis: Principal | ICD-10-CM | POA: Diagnosis present

## 2023-09-10 DIAGNOSIS — Z923 Personal history of irradiation: Secondary | ICD-10-CM

## 2023-09-10 DIAGNOSIS — Z6831 Body mass index (BMI) 31.0-31.9, adult: Secondary | ICD-10-CM

## 2023-09-10 DIAGNOSIS — F32A Depression, unspecified: Secondary | ICD-10-CM | POA: Diagnosis present

## 2023-09-10 DIAGNOSIS — Z7951 Long term (current) use of inhaled steroids: Secondary | ICD-10-CM

## 2023-09-10 DIAGNOSIS — Z882 Allergy status to sulfonamides status: Secondary | ICD-10-CM

## 2023-09-10 LAB — CBC
HCT: 41.3 % (ref 36.0–46.0)
Hemoglobin: 13.9 g/dL (ref 12.0–15.0)
MCH: 30 pg (ref 26.0–34.0)
MCHC: 33.7 g/dL (ref 30.0–36.0)
MCV: 89.2 fL (ref 80.0–100.0)
Platelets: 418 10*3/uL — ABNORMAL HIGH (ref 150–400)
RBC: 4.63 MIL/uL (ref 3.87–5.11)
RDW: 12.9 % (ref 11.5–15.5)
WBC: 9.8 10*3/uL (ref 4.0–10.5)
nRBC: 0 % (ref 0.0–0.2)

## 2023-09-10 NOTE — ED Triage Notes (Addendum)
Pt to ed from home via POV for abdominal pain and emesis. Pt was seen a few days ago for a UTI. Pt is taking the prescribed abx for the UTI as well. Pt advised she has had GI blockage in the past. Pt is caox4, in no acute distress and in wheel chair. Pt has strong smell of cat ammonia.

## 2023-09-11 ENCOUNTER — Inpatient Hospital Stay
Admission: EM | Admit: 2023-09-11 | Discharge: 2023-09-21 | DRG: 392 | Disposition: A | Discharge status: Home / Self Care | Payer: Medicaid Other | Attending: Internal Medicine | Admitting: Internal Medicine

## 2023-09-11 ENCOUNTER — Emergency Department: Payer: Medicaid Other

## 2023-09-11 DIAGNOSIS — K5289 Other specified noninfective gastroenteritis and colitis: Secondary | ICD-10-CM | POA: Diagnosis present

## 2023-09-11 DIAGNOSIS — M069 Rheumatoid arthritis, unspecified: Secondary | ICD-10-CM | POA: Diagnosis present

## 2023-09-11 DIAGNOSIS — E876 Hypokalemia: Secondary | ICD-10-CM | POA: Diagnosis present

## 2023-09-11 DIAGNOSIS — Z8619 Personal history of other infectious and parasitic diseases: Secondary | ICD-10-CM | POA: Diagnosis not present

## 2023-09-11 DIAGNOSIS — K3184 Gastroparesis: Secondary | ICD-10-CM | POA: Diagnosis present

## 2023-09-11 DIAGNOSIS — Z7951 Long term (current) use of inhaled steroids: Secondary | ICD-10-CM | POA: Diagnosis not present

## 2023-09-11 DIAGNOSIS — Z83719 Family history of colon polyps, unspecified: Secondary | ICD-10-CM | POA: Diagnosis not present

## 2023-09-11 DIAGNOSIS — Z800 Family history of malignant neoplasm of digestive organs: Secondary | ICD-10-CM | POA: Diagnosis not present

## 2023-09-11 DIAGNOSIS — I499 Cardiac arrhythmia, unspecified: Secondary | ICD-10-CM | POA: Diagnosis present

## 2023-09-11 DIAGNOSIS — J45909 Unspecified asthma, uncomplicated: Secondary | ICD-10-CM | POA: Diagnosis present

## 2023-09-11 DIAGNOSIS — N390 Urinary tract infection, site not specified: Secondary | ICD-10-CM | POA: Diagnosis present

## 2023-09-11 DIAGNOSIS — I4719 Other supraventricular tachycardia: Secondary | ICD-10-CM | POA: Diagnosis present

## 2023-09-11 DIAGNOSIS — Y842 Radiological procedure and radiotherapy as the cause of abnormal reaction of the patient, or of later complication, without mention of misadventure at the time of the procedure: Secondary | ICD-10-CM | POA: Diagnosis present

## 2023-09-11 DIAGNOSIS — Z808 Family history of malignant neoplasm of other organs or systems: Secondary | ICD-10-CM | POA: Diagnosis not present

## 2023-09-11 DIAGNOSIS — E669 Obesity, unspecified: Secondary | ICD-10-CM | POA: Diagnosis present

## 2023-09-11 DIAGNOSIS — Z8249 Family history of ischemic heart disease and other diseases of the circulatory system: Secondary | ICD-10-CM | POA: Diagnosis not present

## 2023-09-11 DIAGNOSIS — F419 Anxiety disorder, unspecified: Secondary | ICD-10-CM | POA: Diagnosis present

## 2023-09-11 DIAGNOSIS — K627 Radiation proctitis: Secondary | ICD-10-CM | POA: Diagnosis present

## 2023-09-11 DIAGNOSIS — K529 Noninfective gastroenteritis and colitis, unspecified: Principal | ICD-10-CM | POA: Diagnosis present

## 2023-09-11 DIAGNOSIS — Z6831 Body mass index (BMI) 31.0-31.9, adult: Secondary | ICD-10-CM | POA: Diagnosis not present

## 2023-09-11 DIAGNOSIS — D806 Antibody deficiency with near-normal immunoglobulins or with hyperimmunoglobulinemia: Secondary | ICD-10-CM | POA: Diagnosis present

## 2023-09-11 DIAGNOSIS — I472 Ventricular tachycardia, unspecified: Secondary | ICD-10-CM | POA: Diagnosis present

## 2023-09-11 DIAGNOSIS — Z90710 Acquired absence of both cervix and uterus: Secondary | ICD-10-CM | POA: Diagnosis not present

## 2023-09-11 DIAGNOSIS — Z8709 Personal history of other diseases of the respiratory system: Secondary | ICD-10-CM

## 2023-09-11 DIAGNOSIS — Z8542 Personal history of malignant neoplasm of other parts of uterus: Secondary | ICD-10-CM | POA: Diagnosis not present

## 2023-09-11 DIAGNOSIS — D809 Immunodeficiency with predominantly antibody defects, unspecified: Secondary | ICD-10-CM | POA: Diagnosis present

## 2023-09-11 DIAGNOSIS — Z8616 Personal history of COVID-19: Secondary | ICD-10-CM | POA: Diagnosis not present

## 2023-09-11 DIAGNOSIS — Z923 Personal history of irradiation: Secondary | ICD-10-CM | POA: Diagnosis not present

## 2023-09-11 DIAGNOSIS — F32A Depression, unspecified: Secondary | ICD-10-CM | POA: Diagnosis present

## 2023-09-11 LAB — URINALYSIS, ROUTINE W REFLEX MICROSCOPIC
Bilirubin Urine: NEGATIVE
Glucose, UA: NEGATIVE mg/dL
Hgb urine dipstick: NEGATIVE
Ketones, ur: NEGATIVE mg/dL
Leukocytes,Ua: NEGATIVE
Nitrite: NEGATIVE
Protein, ur: NEGATIVE mg/dL
Specific Gravity, Urine: 1.012 (ref 1.005–1.030)
pH: 5 (ref 5.0–8.0)

## 2023-09-11 LAB — CREATININE, SERUM
Creatinine, Ser: 0.96 mg/dL (ref 0.44–1.00)
GFR, Estimated: >60 mL/min (ref >60)

## 2023-09-11 LAB — COMPREHENSIVE METABOLIC PANEL
ALT: 27 U/L (ref 0–44)
AST: 41 U/L (ref 15–41)
Albumin: 4.4 g/dL (ref 3.5–5.0)
Alkaline Phosphatase: 118 U/L (ref 38–126)
Anion gap: 15 (ref 5–15)
BUN: <5 mg/dL — ABNORMAL LOW (ref 6–20)
CO2: 25 mmol/L (ref 22–32)
Calcium: 9.5 mg/dL (ref 8.9–10.3)
Chloride: 100 mmol/L (ref 98–111)
Creatinine, Ser: 0.9 mg/dL (ref 0.44–1.00)
GFR, Estimated: >60 mL/min (ref >60)
Glucose, Bld: 123 mg/dL — ABNORMAL HIGH (ref 70–99)
Potassium: 2.9 mmol/L — ABNORMAL LOW (ref 3.5–5.1)
Sodium: 140 mmol/L (ref 135–145)
Total Bilirubin: 0.8 mg/dL (ref 0.3–1.2)
Total Protein: 7.9 g/dL (ref 6.5–8.1)

## 2023-09-11 LAB — LIPASE, BLOOD: Lipase: 19 U/L (ref 11–51)

## 2023-09-11 LAB — CBC
HCT: 40.3 % (ref 36.0–46.0)
Hemoglobin: 13.3 g/dL (ref 12.0–15.0)
MCH: 29.9 pg (ref 26.0–34.0)
MCHC: 33 g/dL (ref 30.0–36.0)
MCV: 90.6 fL (ref 80.0–100.0)
Platelets: 361 10*3/uL (ref 150–400)
RBC: 4.45 MIL/uL (ref 3.87–5.11)
RDW: 13 % (ref 11.5–15.5)
WBC: 11 10*3/uL — ABNORMAL HIGH (ref 4.0–10.5)
nRBC: 0 % (ref 0.0–0.2)

## 2023-09-11 LAB — BASIC METABOLIC PANEL
Anion gap: 10 (ref 5–15)
BUN: <5 mg/dL — ABNORMAL LOW (ref 6–20)
CO2: 25 mmol/L (ref 22–32)
Calcium: 9.2 mg/dL (ref 8.9–10.3)
Chloride: 105 mmol/L (ref 98–111)
Creatinine, Ser: 0.84 mg/dL (ref 0.44–1.00)
GFR, Estimated: >60 mL/min (ref >60)
Glucose, Bld: 134 mg/dL — ABNORMAL HIGH (ref 70–99)
Potassium: 3.2 mmol/L — ABNORMAL LOW (ref 3.5–5.1)
Sodium: 140 mmol/L (ref 135–145)

## 2023-09-11 MED ORDER — POLYETHYLENE GLYCOL 3350 17 G PO PACK
17.0000 g | PACK | Freq: Every day | ORAL | Status: DC
  Filled 2023-09-11 (×7): qty 1

## 2023-09-11 MED ORDER — ONDANSETRON HCL 4 MG/2ML IJ SOLN
4.0000 mg | Freq: Once | INTRAMUSCULAR | Status: AC
  Administered 2023-09-11: 4 mg via INTRAVENOUS
  Filled 2023-09-11: qty 2

## 2023-09-11 MED ORDER — ONDANSETRON HCL 4 MG PO TABS
4.0000 mg | ORAL_TABLET | Freq: Four times a day (QID) | ORAL | Status: DC | PRN
  Administered 2023-09-16: 4 mg via ORAL
  Filled 2023-09-11: qty 1

## 2023-09-11 MED ORDER — LORAZEPAM 1 MG PO TABS
1.0000 mg | ORAL_TABLET | Freq: Three times a day (TID) | ORAL | Status: DC | PRN
  Administered 2023-09-12 – 2023-09-18 (×4): 1 mg via ORAL
  Filled 2023-09-11 (×4): qty 1

## 2023-09-11 MED ORDER — FUROSEMIDE 40 MG PO TABS: 60.0000 mg | ORAL_TABLET | Freq: Two times a day (BID) | ORAL | Status: DC | PRN

## 2023-09-11 MED ORDER — HYDROCODONE-ACETAMINOPHEN 5-325 MG PO TABS
1.0000 | ORAL_TABLET | ORAL | Status: DC | PRN
  Administered 2023-09-11: 1 via ORAL
  Administered 2023-09-11 – 2023-09-19 (×32): 2 via ORAL
  Filled 2023-09-11 (×19): qty 2
  Filled 2023-09-11: qty 1
  Filled 2023-09-11 (×14): qty 2

## 2023-09-11 MED ORDER — MORPHINE SULFATE (PF) 4 MG/ML IV SOLN
4.0000 mg | Freq: Once | INTRAVENOUS | Status: AC
  Administered 2023-09-11: 4 mg via INTRAVENOUS
  Filled 2023-09-11: qty 1

## 2023-09-11 MED ORDER — ALBUTEROL SULFATE (2.5 MG/3ML) 0.083% IN NEBU
2.5000 mg | INHALATION_SOLUTION | RESPIRATORY_TRACT | Status: DC | PRN
  Administered 2023-09-11: 2.5 mg via RESPIRATORY_TRACT
  Filled 2023-09-11: qty 3

## 2023-09-11 MED ORDER — IOHEXOL 300 MG/ML  SOLN
100.0000 mL | Freq: Once | INTRAMUSCULAR | Status: AC | PRN
  Administered 2023-09-11: 100 mL via INTRAVENOUS

## 2023-09-11 MED ORDER — SODIUM CHLORIDE 0.9 % IV BOLUS (SEPSIS)
1000.0000 mL | Freq: Once | INTRAVENOUS | Status: AC
  Administered 2023-09-11: 1000 mL via INTRAVENOUS

## 2023-09-11 MED ORDER — AMPHETAMINE-DEXTROAMPHETAMINE 10 MG PO TABS: 20.0000 mg | ORAL_TABLET | Freq: Three times a day (TID) | ORAL | Status: DC

## 2023-09-11 MED ORDER — PIPERACILLIN-TAZOBACTAM 3.375 G IVPB 30 MIN
3.3750 g | Freq: Once | INTRAVENOUS | Status: AC
  Administered 2023-09-11: 3.375 g via INTRAVENOUS
  Filled 2023-09-11: qty 50

## 2023-09-11 MED ORDER — MORPHINE SULFATE (PF) 2 MG/ML IV SOLN
2.0000 mg | INTRAVENOUS | Status: DC | PRN
  Administered 2023-09-11 – 2023-09-12 (×8): 2 mg via INTRAVENOUS
  Filled 2023-09-11 (×8): qty 1

## 2023-09-11 MED ORDER — ACETAMINOPHEN 650 MG RE SUPP: 650.0000 mg | Freq: Four times a day (QID) | RECTAL | Status: DC | PRN

## 2023-09-11 MED ORDER — ONDANSETRON HCL 4 MG/2ML IJ SOLN
4.0000 mg | Freq: Four times a day (QID) | INTRAMUSCULAR | Status: DC | PRN
  Administered 2023-09-11 – 2023-09-18 (×17): 4 mg via INTRAVENOUS
  Filled 2023-09-11 (×17): qty 2

## 2023-09-11 MED ORDER — ACETAMINOPHEN 325 MG PO TABS: 650.0000 mg | ORAL_TABLET | Freq: Four times a day (QID) | ORAL | Status: DC | PRN

## 2023-09-11 MED ORDER — PIPERACILLIN-TAZOBACTAM 3.375 G IVPB
3.3750 g | Freq: Three times a day (TID) | INTRAVENOUS | Status: AC
  Administered 2023-09-11 – 2023-09-18 (×21): 3.375 g via INTRAVENOUS
  Filled 2023-09-11 (×22): qty 50

## 2023-09-11 MED ORDER — PRAVASTATIN SODIUM 20 MG PO TABS
40.0000 mg | ORAL_TABLET | Freq: Every day | ORAL | Status: DC
  Administered 2023-09-11 – 2023-09-20 (×10): 40 mg via ORAL
  Filled 2023-09-11 (×11): qty 2

## 2023-09-11 MED ORDER — MOMETASONE FURO-FORMOTEROL FUM 100-5 MCG/ACT IN AERO
2.0000 | INHALATION_SPRAY | Freq: Two times a day (BID) | RESPIRATORY_TRACT | Status: DC
  Administered 2023-09-12 – 2023-09-21 (×18): 2 via RESPIRATORY_TRACT
  Filled 2023-09-11 (×2): qty 8.8

## 2023-09-11 MED ORDER — POTASSIUM CHLORIDE CRYS ER 20 MEQ PO TBCR
40.0000 meq | EXTENDED_RELEASE_TABLET | Freq: Once | ORAL | Status: AC
  Administered 2023-09-11: 40 meq via ORAL
  Filled 2023-09-11: qty 2

## 2023-09-11 MED ORDER — LACTATED RINGERS IV SOLN: INTRAVENOUS | Status: AC

## 2023-09-11 MED ORDER — PANTOPRAZOLE SODIUM 40 MG IV SOLR
40.0000 mg | INTRAVENOUS | Status: DC
  Administered 2023-09-11 – 2023-09-14 (×4): 40 mg via INTRAVENOUS
  Filled 2023-09-11 (×4): qty 10

## 2023-09-11 MED ORDER — ENOXAPARIN SODIUM 40 MG/0.4ML IJ SOSY
0.5000 mg/kg | PREFILLED_SYRINGE | INTRAMUSCULAR | Status: DC
  Administered 2023-09-11 – 2023-09-12 (×2): 42.5 mg via SUBCUTANEOUS
  Filled 2023-09-11 (×2): qty 0.8

## 2023-09-11 NOTE — TOC Initial Note (Signed)
Transition of Care [facility]) - Initial/Assessment Note    Patient Details  Name: [name] Horn MRN: [id number] Date of Birth: 06/09/1976  Transition of Care [facility] CM/SW Contact:    Margarito Liner, LCSW Phone Number: 09/11/2023, 12:14 PM  Clinical Narrative:   Readmission prevention screen complete. CSW met with patient. No supports at bedside. CSW introduced role and explained that discharge planning would be discussed. PCP is [name], PA-C at [facility]. Pharmacy is [facility] on [address] in [place]. Typically no issues obtaining medications but if she needs some financial assistance, her mother helps her. Patient lives home with her husband and cats. No home health prior to admission. She has a lymphedema machine and nebulizer at home. No further concerns. CSW encouraged patient to contact CSW as needed. CSW will continue to follow patient for support and facilitate return home once stable. Her husband will likely transport her home at discharge.            Expected Discharge Plan: Home/Self Care Barriers to Discharge: Continued Medical Work up   Patient Goals and CMS Choice            Expected Discharge Plan and Services     Post Acute Care Choice: NA Living arrangements for the past 2 months: Single Family Home                                      Prior Living Arrangements/Services Living arrangements for the past 2 months: Single Family Home Lives with:: Spouse, Pets Patient language and need for interpreter reviewed:: Yes Do you feel safe going back to the place where you live?: Yes      Need for Family Participation in Patient Care: Yes (Comment) Care giver support system in place?: Yes (comment) Current home services: DME Criminal Activity/Legal Involvement Pertinent to Current Situation/Hospitalization: No - Comment as needed  Activities of Daily Living Home Assistive Devices/Equipment: None ADL Screening (condition at time of  admission) Patient's cognitive ability adequate to safely complete daily activities?: Yes Is the patient deaf or have difficulty hearing?: No Does the patient have difficulty seeing, even when wearing glasses/contacts?: No Does the patient have difficulty concentrating, remembering, or making decisions?: No Patient able to express need for assistance with ADLs?: Yes Does the patient have difficulty dressing or bathing?: No Independently performs ADLs?: Yes (appropriate for developmental age) Does the patient have difficulty walking or climbing stairs?: No Weakness of Legs: None Weakness of Arms/Hands: None  Permission Sought/Granted                  Emotional Assessment Appearance:: Appears stated age Attitude/Demeanor/Rapport: Engaged, Gracious Affect (typically observed): Accepting, Appropriate, Calm, Pleasant Orientation: : Oriented to Self, Oriented to Place, Oriented to  Time, Oriented to Situation Alcohol / Substance Use: Not Applicable Psych Involvement: No (comment)  Admission diagnosis:  Acute colitis [K52.9] Patient Active Problem List   Diagnosis Date Noted   SIRS (systemic inflammatory response syndrome) (HCC) 08/25/2023   UTI (urinary tract infection) 08/25/2023   Recurrent headache 06/06/2023   Aseptic meningitis 05/01/2023   Sepsis (HCC) 05/01/2023   Acute bronchitis 11/11/2022   HLD (hyperlipidemia) 11/11/2022   Lymphedema 11/11/2022   Obesity with body mass index (BMI) of 30.0 to 39.9 11/11/2022   RA (rheumatoid arthritis) (HCC) 11/11/2022   Migraine 11/11/2022   Cardiac arrhythmia_nonsustained V. 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   Chronic rhinitis 04/23/2020   Adverse food reaction 04/23/2020   Eosinophilic esophagitis 04/03/2020   Oral candidiasis 04/02/2020   History of Clostridium difficile infection 04/02/2020   History of shingles 04/02/2020   History of asthma 04/02/2020   Multiple drug allergies 04/02/2020   Environmental allergies 04/02/2020   History of loop recorder 01/19/2020   PVC (premature ventricular contraction) 01/19/2020   Uterine cancer (HCC) 01/19/2020   Acute colitis 08/14/2019   Constipation    Gastroparesis 04/03/2019   Palpitations 04/10/2018   Nausea and vomiting 04/26/2017   Nausea & vomiting 04/25/2017   Chest pain 04/25/2017   Enterocolitis due to Clostridium difficile 01/04/2016   Incisional hernia without obstruction or gangrene 11/18/2015   Personal history of other diseases of the nervous system and sense organs 10/14/2015   Disease of pancreas 01/21/2015   Other specified bacterial agents as the cause of diseases classified elsewhere 01/21/2015   Radiculopathy of cervical region 12/09/2014   Brachial neuritis 12/09/2014   Cervical radicular pain 12/09/2014   Annual physical exam 01/30/2012   Melena 01/30/2012   Pain in ankle 11/24/2010   Anxiety state 10/11/2010   DVT 10/11/2010   Generalized anxiety disorder 10/11/2010   COSTOCHONDRITIS 03/03/2008   Cellulitis of toe 12/01/2007   MENOPAUSE, PREMATURE 07/20/2007   UTERINE CANCER, HX OF 07/16/2007   Other postprocedural status(V45.89) 07/16/2007   PCP:  [facility] Pharmacy:   [facility] [phone] - [place], [place] - [address] AT  [facility] [address] [place] [address] Phone: [phone] Fax: [phone]     Social Determinants of Health (SDOH) Social History: SDOH Screenings   Food Insecurity: No Food Insecurity (09/11/2023)  Recent Concern: Food Insecurity - Food Insecurity Present (08/26/2023)   [facility] from [facility]  Housing: Patient Declined (09/11/2023)  Transportation Needs: No Transportation Needs (09/11/2023)  Utilities: Not At Risk (09/11/2023)  Depression (PHQ2-9): Low Risk  (07/06/2021)  Financial Resource Strain: Medium Risk (08/26/2023)   Received from [facility]  Social Connections: Unknown (05/06/2022)   Received from [facility]  Tobacco Use: Medium Risk (09/10/2023)   SDOH Interventions:     Readmission Risk Interventions    09/11/2023   12:13 PM 11/13/2022   10:26 AM  Readmission Risk Prevention Plan  Transportation Screening Complete Complete  PCP or Specialist Appt within 3-5 Days  Complete  HRI or Home Care Consult  Complete  Social Work Consult for Recovery Care Planning/Counseling  Complete  Palliative Care Screening  Not Applicable  Medication Review Oceanographer) Complete Complete  PCP or Specialist appointment within 3-5 days of discharge Complete   SW Recovery Care/Counseling Consult Complete   Palliative Care Screening Not Applicable   Skilled Nursing Facility Not Applicable

## 2023-09-11 NOTE — H&P (Signed)
History and Physical    Patient: [name] Horn ZDG:[id number] DOB: 1976/12/09 DOA: 09/11/2023 DOS: the patient was seen and examined on 09/11/2023 PCP: [facility]  Patient coming from: Home  Chief Complaint:  Chief Complaint  Patient presents with   Abdominal Pain    X 2 weeks    HPI: [name] Horn is a 47 y.o. female with medical history significant for Rheumatoid arthritis previously on methotrexate, migraines, atrial tachycardia, eosinophilic esophagitis, C. difficile colitis, antibody deficiency syndrome with multiple infections, previously on monthly IVIG until April 2024 when it was suspected to be the cause of aseptic meningitis that same month who presents to the ED with a 2-week history of lower abdominal pain nausea and vomiting.  She states her vomitus was dark but not bloody and without coffee grounds.  Feels like it was bile.  She denies diarrhea.  States she had a temperature of 101 at home.  Patient was seen a few days prior and started on Keflex for UTI.. ED course And data review: Afebrile on arrival, BP 131/101.  Tachycardic to 115 (patient states this is normal for her atrial tachycardia) Labs notable for normal WBC and hemoglobin Potassium 2.9 with otherwise normal CMP and normal lipase Urinalysis unremarkable  CT abdomen and pelvis shows the following: IMPRESSION: 1. Sigmoid colitis, likely infectious/inflammatory. 2. Hepatic steatosis.  Patient treated with IV fluids, morphine and started on [facility] consulted for admission.   Review of Systems: As mentioned in the history of present illness. All other systems reviewed and are negative.  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   Herpes labialis 08/12/2020   Hypokalemia 05/01/2023   IBD (inflammatory bowel disease)    Intertrigo 06/22/2020   Leukopenia 07/06/2021   Lymphedema    Malaise 12/30/2020   RA (rheumatoid arthritis) (HCC)    Rheumatoid arthritis (HCC) 08/12/2020   Transaminitis 08/12/2020   Uterine cancer [facility])    Past Surgical History:  Procedure Laterality Date   APPENDECTOMY     cheek biopsy  11/2020   CHOLECYSTECTOMY     ESOPHAGOGASTRODUODENOSCOPY N/A 04/30/2017   Procedure: ESOPHAGOGASTRODUODENOSCOPY (EGD);  Surgeon: Dorena Cookey, MD;  Location: [facility] ENDOSCOPY;  Service: Endoscopy;  Laterality: N/A;   HERNIA REPAIR     x2   KNEE SURGERY     x3   MINIMALLY INVASIVE FORAMINOTOMY CERVICAL SPINE     C6-T1, Nitka   SAVORY DILATION N/A 04/30/2017   Procedure: SAVORY DILATION;  Surgeon: Dorena Cookey, MD;  Location: [facility] ENDOSCOPY;  Service: Endoscopy;  Laterality: N/A;   TONSILLECTOMY     TOTAL ABDOMINAL HYSTERECTOMY     Sarcoma, s/p XRT   Social History:  reports that she has never smoked. She has been exposed to tobacco smoke. She has never used smokeless tobacco. She reports that she does not currently use alcohol. She reports current drug use.  Allergies  Allergen Reactions   Cyanoacrylate Hives   Other Itching and Rash    Dermabond surgical glue.  Dermabond surgical glue-blisters   Sulfa Antibiotics Anaphylaxis, Rash, Shortness Of Breath and Swelling    Angioedema (also)   Sulfonamide Derivatives Hives, Shortness Of Breath and Swelling  TONGUE SWELLS   Benadryl [Diphenhydramine Hcl] Other (See Comments)    Hyperactivity    Sulfamethoxazole     Other Reaction(s): Angioedema   Diphenhydramine    Diphenhydramine Hcl     Other Reaction(s): Other (See Comments)  HYPERACTIVITY, RESTLESS LEG   Silicone Rash    Watch band    Family History  Problem Relation Age of Onset   Hashimoto's thyroiditis Mother    Eczema Mother    Angioedema Mother    Colonic polyp Mother     Throat cancer Father    Arrhythmia Father    Angioedema Maternal Grandfather    Hypertension Other    Hyperlipidemia Other    Colon cancer Other        grandmother    Prior to Admission medications   Medication Sig Start Date End Date Taking? Authorizing Provider  albuterol (PROVENTIL) (2.5 MG/3ML) 0.083% nebulizer solution Take 3 mLs (2.5 mg total) by nebulization every 4 (four) hours as needed for wheezing or shortness of breath. 11/20/22   Pahwani, Kasandra Knudsen, MD  albuterol (VENTOLIN HFA) 108 (90 Base) MCG/ACT inhaler Inhale 1 puff into the lungs every 4 (four) hours as needed. 04/12/23   [provider]  amphetamine-dextroamphetamine (ADDERALL) 20 MG tablet Take 20 mg by mouth 3 (three) times daily.    [provider]  atropine 1 % ophthalmic solution Place 1 drop into the left eye daily as needed (for dry eye).    [provider]  baclofen (LIORESAL) 10 MG tablet Take 10 mg by mouth at bedtime. 02/28/20   [provider]  Bepotastine Besilate 1.5 % SOLN Place 1 drop into both eyes 2 (two) times a day.    [provider]  budesonide-formoterol (SYMBICORT) 80-4.5 MCG/ACT inhaler Inhale 2 puffs into the lungs 2 (two) times daily. Can also take Q4 hr as needed for wheezing 11/17/22   Burnadette Pop, MD  cephALEXin (KEFLEX) 500 MG capsule Take 1 capsule (500 mg total) by mouth 4 (four) times daily for 10 days. 09/07/23 09/17/23  Evon Slack, PA-C  cyclobenzaprine (FLEXERIL) 10 MG tablet Take 1 tablet (10 mg total) by mouth at bedtime. 11/20/22   Pahwani, Kasandra Knudsen, MD  cycloSPORINE (RESTASIS) 0.05 % ophthalmic emulsion Place 1 drop into both eyes 2 (two) times daily.    [provider]  dicyclomine (BENTYL) 10 MG capsule Take 10 mg by mouth daily as needed for nausea/vomiting. 01/02/19   [provider]  EPINEPHrine (EPIPEN 2-PAK) 0.3 mg/0.3 mL IJ SOAJ injection Inject 0.3 mg into the muscle as needed for anaphylaxis.    [provider]  famotidine (PEPCID) 40 MG tablet Take 40 mg by mouth at bedtime. 05/02/21   [provider]  folic acid (FOLVITE) 1 MG tablet Take 2 tablets (2 mg total) by mouth daily. 10/30/19   Pollyann Savoy, MD  furosemide (LASIX) 20 MG tablet Take 60 mg by mouth 2 (two) times daily as needed for fluid or edema.    [provider]  LORazepam (ATIVAN) 1 MG tablet Take 1 mg by mouth 3 (three) times daily as needed for anxiety. 03/08/20   [provider]  metoCLOPramide (REGLAN) 10 MG tablet Take 10 mg by mouth 2 (two) times daily.    [provider]  naloxone [facility]) nasal spray 4 mg/0.1 mL Place 1 spray into the nose once. 02/13/23   [provider]  nystatin (MYCOSTATIN) 100000 UNIT/ML suspension Take 10 mLs (1,000,000 Units total) by mouth  3 (three) times daily. 12/07/20   Randall Hiss, MD  nystatin powder Apply 1 application topically 2 (two) times daily as needed (rash).    [provider]  ondansetron (ZOFRAN-ODT) 4 MG disintegrating tablet Take 1 tablet (4 mg total) by mouth every 8 (eight) hours as needed for nausea or vomiting. 06/28/22   Chesley Noon, MD  oxyCODONE-acetaminophen (PERCOCET) 10-325 MG tablet Take 1 tablet by mouth every 4 (four) hours as needed for pain.    [provider]  pantoprazole (PROTONIX) 40 MG tablet Take 1 tablet (40 mg total) by mouth 2 (two) times daily. 05/02/17 08/04/28  Elgergawy, Leana Roe, MD  Polyethyl Glycol-Propyl Glycol (SYSTANE) 0.4-0.3 % GEL ophthalmic gel Place 1 application into both eyes in the morning and at bedtime.    [provider]  Potassium Chloride ER 20 MEQ TBCR Take 20 mEq by mouth daily as needed (when taking furosemide).    [provider]  pravastatin (PRAVACHOL) 40 MG tablet Take 40 mg by mouth at bedtime.    [provider]  prednisoLONE acetate (PRED FORTE) 1 % ophthalmic suspension Place 1 drop into the left eye as needed  (conjunctivitis).    [provider]  prochlorperazine (COMPAZINE) 10 MG tablet Take 1 tablet (10 mg total) by mouth every 6 (six) hours as needed for nausea or vomiting. 09/07/23   Evon Slack, PA-C  promethazine (PHENERGAN) 25 MG tablet Take 1 tablet (25 mg total) by mouth every 6 (six) hours as needed for nausea or vomiting. 09/25/22   Shaune Pollack, MD  SUMAtriptan (IMITREX) 25 MG tablet Take 25 mg by mouth every 2 (two) hours as needed for migraine. May repeat in 2 hours if headache persists or recurs.    [provider]  topiramate (TOPAMAX) 100 MG tablet Take 100 mg by mouth daily. 06/18/19   [provider]  topiramate (TOPAMAX) 25 MG tablet Take 25 mg by mouth at bedtime.    [provider]  triamcinolone lotion (KENALOG) 0.1 % Apply 1 application topically daily as needed (rash). 07/07/20   [provider]  valACYclovir (VALTREX) 1000 MG tablet Take 1 tablet (1,000 mg total) by mouth daily. 08/12/20   Randall Hiss, MD    Physical Exam: Vitals:   09/10/23 2340 09/10/23 2340 09/11/23 0100 09/11/23 0248  BP:  (!) 138/101 136/84 132/83  Pulse:  (!) 115 (!) 113 (!) 113  Resp:  20 20 18   Temp:  98.9 F (37.2 C)    SpO2:  95% 100% 100%  Weight: 83 kg     Height: 5\' 4"  (1.626 m)      Physical Exam Vitals and nursing note reviewed.  Constitutional:      General: She is not in acute distress. HENT:     Head: Normocephalic and atraumatic.  Cardiovascular:     Rate and Rhythm: Normal rate and regular rhythm.     Heart sounds: Normal heart sounds.  Pulmonary:     Effort: Pulmonary effort is normal.     Breath sounds: Normal breath sounds.  Abdominal:     Palpations: Abdomen is soft.     Tenderness: There is abdominal tenderness in the left lower quadrant.  Neurological:     Mental Status: Mental status is at baseline.     Labs on Admission: I have personally reviewed following labs and imaging studies  CBC: Recent Labs   Lab 09/07/23 1605 09/10/23 2340  WBC 12.0* 9.8  HGB  12.1 13.9  HCT 35.0* 41.3  MCV 88.6 89.2  PLT 300 418*   Basic Metabolic Panel: Recent Labs  Lab 09/07/23 1605 09/10/23 2340  NA 133* 140  K 3.3* 2.9*  CL 96* 100  CO2 24 25  GLUCOSE 123* 123*  BUN 8 <5*  CREATININE 0.95 0.90  CALCIUM 8.9 9.5   GFR: Estimated Creatinine Clearance: 79.6 mL/min (by C-G formula based on SCr of 0.9 mg/dL). Liver Function Tests: Recent Labs  Lab 09/10/23 2340  AST 41  ALT 27  ALKPHOS 118  BILITOT 0.8  PROT 7.9  ALBUMIN 4.4   Recent Labs  Lab 09/10/23 2340  LIPASE 19   No results for input(s): "AMMONIA" in the last 168 hours. Coagulation Profile: No results for input(s): "INR", "PROTIME" in the last 168 hours. Cardiac Enzymes: No results for input(s): "CKTOTAL", "CKMB", "CKMBINDEX", "TROPONINI" in the last 168 hours. BNP (last 3 results) No results for input(s): "PROBNP" in the last 8760 hours. HbA1C: No results for input(s): "HGBA1C" in the last 72 hours. CBG: No results for input(s): "GLUCAP" in the last 168 hours. Lipid Profile: No results for input(s): "CHOL", "HDL", "LDLCALC", "TRIG", "CHOLHDL", "LDLDIRECT" in the last 72 hours. Thyroid Function Tests: No results for input(s): "TSH", "T4TOTAL", "FREET4", "T3FREE", "THYROIDAB" in the last 72 hours. Anemia Panel: No results for input(s): "VITAMINB12", "FOLATE", "FERRITIN", "TIBC", "IRON", "RETICCTPCT" in the last 72 hours. Urine analysis:    Component Value Date/Time   COLORURINE AMBER (A) 09/10/2023 2340   APPEARANCEUR CLEAR (A) 09/10/2023 2340   LABSPEC 1.012 09/10/2023 2340   PHURINE 5.0 09/10/2023 2340   GLUCOSEU NEGATIVE 09/10/2023 2340   HGBUR NEGATIVE 09/10/2023 2340   HGBUR negative 07/25/2007 1131   BILIRUBINUR NEGATIVE 09/10/2023 2340   KETONESUR NEGATIVE 09/10/2023 2340   PROTEINUR NEGATIVE 09/10/2023 2340   UROBILINOGEN 1.0 05/27/2009 1500   NITRITE NEGATIVE 09/10/2023 2340   LEUKOCYTESUR NEGATIVE  09/10/2023 2340    Radiological Exams on Admission: CT ABDOMEN PELVIS W CONTRAST  Result Date: 09/11/2023 CLINICAL DATA:  Abdominal pain and emesis.  On antibiotics for UTI. EXAM: CT ABDOMEN AND PELVIS WITH CONTRAST TECHNIQUE: Multidetector CT imaging of the abdomen and pelvis was performed using the standard protocol following bolus administration of intravenous contrast. RADIATION DOSE REDUCTION: This exam was performed according to the departmental dose-optimization program which includes automated exposure control, adjustment of the mA and/or kV according to patient size and/or use of iterative reconstruction technique. CONTRAST:  OMNIPAQUE IOHEXOL 300 MG/ML  SOLN COMPARISON:  Same day abdominal radiograph and CT abdomen and pelvis 09/25/2022 FINDINGS: Lower chest: No acute abnormality. Hepatobiliary: Hepatic steatosis. Cholecystectomy. No biliary dilation. Pancreas: Unremarkable. Spleen: Unremarkable. Adrenals/Urinary Tract: Normal adrenal glands. No urinary calculi or hydronephrosis. Bladder is unremarkable. Stomach/Bowel: Moderate colonic stool burden. No evidence of obstruction. Wall thickening and pericolonic stranding about the sigmoid colon compatible with colitis. Small hiatal hernia. Appendix is not visualized. Vascular/Lymphatic: No significant vascular findings are present. No enlarged abdominal or pelvic lymph nodes. Reproductive: Hysterectomy. Other: Small volume free fluid in the pelvis. No free intraperitoneal air. No abscess. Musculoskeletal: No acute fracture. IMPRESSION: 1. Sigmoid colitis, likely infectious/inflammatory. 2. Hepatic steatosis. Electronically Signed   By: Minerva Fester M.D.   On: 09/11/2023 02:47   DG Abdomen 1 View  Result Date: 09/11/2023 CLINICAL DATA:  Constipation EXAM: ABDOMEN - 1 VIEW COMPARISON:  None Available. FINDINGS: Nonobstructive bowel gas pattern. Moderate colonic stool burden, suggesting constipation. Cholecystectomy clips. IMPRESSION: Moderate  colonic stool burden, suggesting constipation.  Electronically Signed   By: Charline Bills M.D.   On: 09/11/2023 00:42     Data Reviewed: Relevant notes from primary care and specialist visits, past discharge summaries as available in EHR, including Care Everywhere. Prior diagnostic testing as pertinent to current admission diagnoses Updated medications and problem lists for reconciliation ED course, including vitals, labs, imaging, treatment and response to treatment Triage notes, nursing and pharmacy notes and ED provider's notes Notable results as noted in HPI   Assessment and Plan: * Acute colitis History of radiation proctitis/sigmoid colitis/C. difficile colitis Continue Zosyn Clear liquids IV hydration, IV antiemetics, pain meds  Antibody deficiency syndrome (HCC) Previously treated with IVIG until April 2024 when it was suspected to be the cause of aseptic meningitis that same month  Followed at [facility]  History of asthma Continue Symbicort DuoNebs as needed  Cardiac arrhythmia_nonsustained V. 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 Inpatient status is judged to be reasonable and necessary in order to provide the required intensity of service to ensure the patient's safety. The patient's presenting symptoms, physical exam findings, and initial radiographic and laboratory data in the  context of their chronic comorbidities is felt to place them at high risk for further clinical deterioration. Furthermore, it is not anticipated that the patient will be medically stable for discharge from the hospital within 2 midnights of admission.   * I certify that at the point of admission it is my clinical judgment that the patient will require inpatient hospital care spanning beyond 2 midnights from the point of admission due to high intensity of service, high risk for further deterioration and high frequency of surveillance required.*  Author: Andris Baumann, MD 09/11/2023 3:32 AM  For on call review [email or web address].

## 2023-09-11 NOTE — Assessment & Plan Note (Addendum)
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

## 2023-09-11 NOTE — Assessment & Plan Note (Signed)
Continue Flexeril and oxycodone

## 2023-09-11 NOTE — Assessment & Plan Note (Signed)
History of radiation proctitis/sigmoid colitis/C. difficile colitis Continue Zosyn Clear liquids IV hydration, IV antiemetics, pain meds

## 2023-09-11 NOTE — Assessment & Plan Note (Signed)
Patient states she has baseline tachycardia to the 120s

## 2023-09-11 NOTE — Progress Notes (Signed)
Introduced patient to role of Statistician. Pt known to navigator from previous admissions. Pt sees [name] at [facility] for primary care. She identifies her husband, Barbara Cower and mother, Darl Pikes as her support systems.   Pt endorses some financial assistance needs, including issues with affording medications at times. Pt does rely on her mother for help with monetary needs. Pt states she receives roughly $200/mo in food stamps, which last her "almost a month."   Pt has currently applied for SSDI with the assistance of an attorney. Court date set for November of this year.

## 2023-09-11 NOTE — Progress Notes (Signed)
Anticoagulation monitoring(Lovenox):  47 yo  female ordered Lovenox 40 mg Q24h    Filed Weights   09/10/23 2340  Weight: 83 kg (182 lb 15.7 oz)   BMI 31.4    Lab Results  Component Value Date   CREATININE 0.90 09/10/2023   CREATININE 0.95 09/07/2023   CREATININE 0.92 08/25/2023   Estimated Creatinine Clearance: 79.6 mL/min (by C-G formula based on SCr of 0.9 mg/dL). Hemoglobin & Hematocrit     Component Value Date/Time   HGB 13.9 09/10/2023 2340   HGB 11.5 04/02/2020 1545   HCT 41.3 09/10/2023 2340   HCT 35.2 04/02/2020 1545     Per Protocol for Patient with estCrcl > 30 ml/min and BMI > 30, will transition to Lovenox 42.5 mg Q24h.

## 2023-09-11 NOTE — Assessment & Plan Note (Addendum)
Previously treated with IVIG until April 2024 when it was suspected to be the cause of aseptic meningitis that same month  Followed at [facility]

## 2023-09-11 NOTE — Progress Notes (Incomplete)
  INTERVAL PROGRESS NOTE    [name] Horn- 47 y.o. female  LOS: 0 __________________________________________________________________  SUBJECTIVE: Admitted 09/11/2023 with cc of  Chief Complaint  Patient presents with   Abdominal Pain    X 2 weeks  *** Since admission, ***  OBJECTIVE: Blood pressure 122/89, pulse (!) 115, temperature 98.9 F (37.2 C), resp. rate (!) 24, height 5\' 4"  (1.626 m), weight 83 kg, SpO2 99%.  General: NAD, pleasant, able to participate in exam Cardiac: RRR, normal heart sounds, no murmurs. 2+ radial and PT pulses bilaterally Respiratory: CTAB, normal effort, No wheezes, rales or rhonchi Abdomen: soft, nontender, nondistended, no hepatic or splenomegaly, +BS Extremities: no edema. WWP. Skin: warm and dry, no rashes noted Neuro: alert and oriented, no focal deficits Psych: Normal affect and mood   ASSESSMENT/PLAN:  I have reviewed the full H&P by Dr. ***, and I agree with the assessment and plan as outlined therein. In addition: ***   Principal Problem:   Acute colitis Active Problems:   Antibody deficiency syndrome (HCC)   History of asthma   Cardiac arrhythmia_nonsustained V. tach and atrial tachycardia   UTERINE CANCER, HX OF   History of Clostridium difficile infection   Rheumatoid arthritis (HCC)      Leeroy Bock, DO [facility] 09/11/2023, 7:43 AM    [email or web address] Available by Epic secure chat 7AM-7PM. If 7PM-7AM, please contact night-coverage   ***No Charge

## 2023-09-11 NOTE — Progress Notes (Signed)
Pharmacy Antibiotic Note  [name] Horn is a 47 y.o. female admitted on 09/11/2023 with  intra-abdominal .  Pharmacy has been consulted for Zosyn dosing.  Plan: Zosyn 3.375g IV q8h (4 hour infusion).  Height: 5\' 4"  (162.6 cm) Weight: 83 kg (182 lb 15.7 oz) IBW/kg (Calculated) : 54.7  Temp (24hrs), Avg:98.9 F (37.2 C), Min:98.9 F (37.2 C), Max:98.9 F (37.2 C)  Recent Labs  Lab 09/07/23 1605 09/10/23 2340  WBC 12.0* 9.8  CREATININE 0.95 0.90    Estimated Creatinine Clearance: 79.6 mL/min (by C-G formula based on SCr of 0.9 mg/dL).    Allergies  Allergen Reactions   Cyanoacrylate Hives   Other Itching and Rash    Dermabond surgical glue.  Dermabond surgical glue-blisters   Sulfa Antibiotics Anaphylaxis, Rash, Shortness Of Breath and Swelling    Angioedema (also)   Sulfonamide Derivatives Hives, Shortness Of Breath and Swelling    TONGUE SWELLS   Benadryl [Diphenhydramine Hcl] Other (See Comments)    Hyperactivity    Sulfamethoxazole     Other Reaction(s): Angioedema   Diphenhydramine    Diphenhydramine Hcl     Other Reaction(s): Other (See Comments)  HYPERACTIVITY, RESTLESS LEG   Silicone Rash    Watch band    Antimicrobials this admission:   >>    >>   Dose adjustments this admission:   Microbiology results:  BCx:   UCx:    Sputum:    MRSA PCR:   Thank you for allowing pharmacy to be a part of this patient's care.  [name] Horn D 09/11/2023 3:58 AM

## 2023-09-11 NOTE — ED Notes (Signed)
Pt c/o of moderate pain, messaged HCP for further instructions as Pt has not had a BM and c/o pain from that as well.

## 2023-09-11 NOTE — ED Provider Notes (Signed)
[facility] Provider Note    Event Date/Time   First MD Initiated Contact with Patient 09/11/23 0100     (approximate)   History   Abdominal Pain (X 2 weeks)   HPI  [name] Horn is a 47 y.o. 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  States her last bowel movement was several days ago and she is not passing gas.  She has had previous appendectomy, cholecystectomy, hernia repair x 2, total abdominal hysterectomy.  Reports she has had a bowel obstruction before and this feels similar.  Reports fever at home of 101.   History provided by patient, family.    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   Herpes labialis 08/12/2020   Hypokalemia 05/01/2023   IBD (inflammatory bowel disease)    Intertrigo 06/22/2020   Leukopenia 07/06/2021   Lymphedema    Malaise 12/30/2020   RA (rheumatoid arthritis) (HCC)    Rheumatoid arthritis (HCC) 08/12/2020   Transaminitis 08/12/2020   Uterine cancer [facility])     Past Surgical History:  Procedure Laterality Date   APPENDECTOMY     cheek biopsy  11/2020   CHOLECYSTECTOMY     ESOPHAGOGASTRODUODENOSCOPY N/A 04/30/2017   Procedure: ESOPHAGOGASTRODUODENOSCOPY (EGD);  Surgeon: Dorena Cookey, MD;  Location: [facility] ENDOSCOPY;  Service: Endoscopy;  Laterality: N/A;   HERNIA REPAIR     x2   KNEE SURGERY     x3   MINIMALLY INVASIVE FORAMINOTOMY CERVICAL SPINE     C6-T1, Nitka   SAVORY DILATION N/A 04/30/2017   Procedure: SAVORY  DILATION;  Surgeon: Dorena Cookey, MD;  Location: [facility] ENDOSCOPY;  Service: Endoscopy;  Laterality: N/A;   TONSILLECTOMY     TOTAL ABDOMINAL HYSTERECTOMY     Sarcoma, s/p XRT    MEDICATIONS:  Prior to Admission medications   Medication Sig Start Date End Date Taking? Authorizing Provider  albuterol (PROVENTIL) (2.5 MG/3ML) 0.083% nebulizer solution Take 3 mLs (2.5 mg total) by nebulization every 4 (four) hours as needed for wheezing or shortness of breath. 11/20/22   Pahwani, Kasandra Knudsen, MD  albuterol (VENTOLIN HFA) 108 (90 Base) MCG/ACT inhaler Inhale 1 puff into the lungs every 4 (four) hours as needed. 04/12/23   [provider]  amphetamine-dextroamphetamine (ADDERALL) 20 MG tablet Take 20 mg by mouth 3 (three) times daily.    [provider]  atropine 1 % ophthalmic solution Place 1 drop into the left eye daily as needed (for dry eye).    [provider]  baclofen (LIORESAL) 10 MG tablet Take 10 mg by mouth at bedtime. 02/28/20   [provider]  Bepotastine Besilate 1.5 % SOLN Place 1 drop into both eyes 2 (two) times a day.    [provider]  budesonide-formoterol (SYMBICORT) 80-4.5 MCG/ACT inhaler Inhale 2 puffs into the lungs 2 (two) times daily. Can also take Q4 hr as needed for wheezing 11/17/22   Burnadette Pop, MD  cephALEXin (KEFLEX) 500 MG capsule Take 1 capsule (500 mg total) by mouth 4 (  four) times daily for 10 days. 09/07/23 09/17/23  Evon Slack, PA-C  cyclobenzaprine (FLEXERIL) 10 MG tablet Take 1 tablet (10 mg total) by mouth at bedtime. 11/20/22   Pahwani, Kasandra Knudsen, MD  cycloSPORINE (RESTASIS) 0.05 % ophthalmic emulsion Place 1 drop into both eyes 2 (two) times daily.    [provider]  dicyclomine (BENTYL) 10 MG capsule Take 10 mg by mouth daily as needed for nausea/vomiting. 01/02/19   [provider]  EPINEPHrine (EPIPEN 2-PAK) 0.3 mg/0.3 mL IJ SOAJ injection Inject 0.3 mg into the muscle as needed for anaphylaxis.     [provider]  famotidine (PEPCID) 40 MG tablet Take 40 mg by mouth at bedtime. 05/02/21   [provider]  folic acid (FOLVITE) 1 MG tablet Take 2 tablets (2 mg total) by mouth daily. 10/30/19   Pollyann Savoy, MD  furosemide (LASIX) 20 MG tablet Take 60 mg by mouth 2 (two) times daily as needed for fluid or edema.    [provider]  LORazepam (ATIVAN) 1 MG tablet Take 1 mg by mouth 3 (three) times daily as needed for anxiety. 03/08/20   [provider]  metoCLOPramide (REGLAN) 10 MG tablet Take 10 mg by mouth 2 (two) times daily.    [provider]  naloxone [facility]) nasal spray 4 mg/0.1 mL Place 1 spray into the nose once. 02/13/23   [provider]  nystatin (MYCOSTATIN) 100000 UNIT/ML suspension Take 10 mLs (1,000,000 Units total) by mouth 3 (three) times daily. 12/07/20   Randall Hiss, MD  nystatin powder Apply 1 application topically 2 (two) times daily as needed (rash).    [provider]  ondansetron (ZOFRAN-ODT) 4 MG disintegrating tablet Take 1 tablet (4 mg total) by mouth every 8 (eight) hours as needed for nausea or vomiting. 06/28/22   Chesley Noon, MD  oxyCODONE-acetaminophen (PERCOCET) 10-325 MG tablet Take 1 tablet by mouth every 4 (four) hours as needed for pain.    [provider]  pantoprazole (PROTONIX) 40 MG tablet Take 1 tablet (40 mg total) by mouth 2 (two) times daily. 05/02/17 08/04/28  Elgergawy, Leana Roe, MD  Polyethyl Glycol-Propyl Glycol (SYSTANE) 0.4-0.3 % GEL ophthalmic gel Place 1 application into both eyes in the morning and at bedtime.    [provider]  Potassium Chloride ER 20 MEQ TBCR Take 20 mEq by mouth daily as needed (when taking furosemide).    [provider]  pravastatin (PRAVACHOL) 40 MG tablet Take 40 mg by mouth at bedtime.    [provider]  prednisoLONE acetate (PRED FORTE) 1 % ophthalmic suspension Place 1 drop into the left eye as needed  (conjunctivitis).    [provider]  prochlorperazine (COMPAZINE) 10 MG tablet Take 1 tablet (10 mg total) by mouth every 6 (six) hours as needed for nausea or vomiting. 09/07/23   Evon Slack, PA-C  promethazine (PHENERGAN) 25 MG tablet Take 1 tablet (25 mg total) by mouth every 6 (six) hours as needed for nausea or vomiting. 09/25/22   Shaune Pollack, MD  SUMAtriptan (IMITREX) 25 MG tablet Take 25 mg by mouth every 2 (two) hours as needed for migraine. May repeat in 2 hours if headache persists or recurs.    [provider]  topiramate (TOPAMAX) 100 MG tablet Take 100 mg by mouth daily. 06/18/19   [provider]  topiramate (TOPAMAX) 25 MG tablet Take 25 mg by mouth at bedtime.    [provider]  triamcinolone lotion (KENALOG) 0.1 % Apply 1 application topically daily as needed (rash). 07/07/20   [provider]  valACYclovir (VALTREX) 1000 MG tablet Take 1 tablet (1,000 mg total) by mouth daily. 08/12/20   Randall Hiss, MD    Physical Exam   Triage Vital Signs: ED Triage Vitals  Encounter Vitals Group     BP 09/10/23 2340 (!) 138/101     Systolic BP Percentile --      Diastolic BP Percentile --      Pulse Rate 09/10/23 2340 (!) 115     Resp 09/10/23 2340 20     Temp 09/10/23 2340 98.9 F (37.2 C)     Temp src --      SpO2 09/10/23 2340 95 %     Weight 09/10/23 2340 182 lb 15.7 oz (83 kg)     Height 09/10/23 2340 5\' 4"  (1.626 m)     Head Circumference --      Peak Flow --      Pain Score 09/10/23 2340 10     Pain Loc --      Pain Education --      Exclude from Growth Chart --     Most recent vital signs: Vitals:   09/11/23 0100 09/11/23 0248  BP: 136/84 132/83  Pulse: (!) 113 (!) 113  Resp: 20 18  Temp:    SpO2: 100% 100%    CONSTITUTIONAL: Alert, responds appropriately to questions.  Appears uncomfortable HEAD: Normocephalic, atraumatic EYES: Conjunctivae clear, pupils appear equal, sclera nonicteric ENT:  normal nose; moist mucous membranes NECK: Supple, normal ROM CARD: Regular and tachycardic; S1 and S2 appreciated RESP: Normal chest excursion without splinting or tachypnea; breath sounds clear and equal bilaterally; no wheezes, no rhonchi, no rales, no hypoxia or respiratory distress, speaking full sentences ABD/GI: Distended, tender to palpation diffusely, no guarding or rebound BACK: The back appears normal EXT: Normal ROM in all joints; no deformity noted, no edema SKIN: Normal color for age and race; warm; no rash on exposed skin NEURO: Moves all extremities equally, normal speech PSYCH: The patient's mood and manner are appropriate.   ED Results / Procedures / Treatments   LABS: (all labs ordered are listed, but only abnormal results are displayed) Labs Reviewed  COMPREHENSIVE METABOLIC PANEL - Abnormal; Notable for the following components:      Result Value   Potassium 2.9 (*)    Glucose, Bld 123 (*)    BUN <5 (*)    All other components within normal limits  CBC - Abnormal; Notable for the following components:   Platelets 418 (*)    All other components within normal limits  URINALYSIS, ROUTINE W REFLEX MICROSCOPIC - Abnormal; Notable for the following components:   Color, Urine AMBER (*)    APPearance CLEAR (*)    All other components within normal limits  LIPASE, BLOOD     EKG:   RADIOLOGY: My personal review and interpretation of imaging: CT scan shows colitis.  I have personally reviewed all radiology reports.   CT ABDOMEN PELVIS W CONTRAST  Result Date: 09/11/2023 CLINICAL DATA:  Abdominal pain and emesis.  On antibiotics for UTI. EXAM: CT ABDOMEN AND PELVIS WITH CONTRAST TECHNIQUE: Multidetector CT imaging of the abdomen and pelvis was performed using the standard protocol following bolus administration of intravenous contrast. RADIATION DOSE REDUCTION: This exam was performed according to the departmental dose-optimization program which includes automated  exposure control, adjustment of the mA and/or kV according  to patient size and/or use of iterative reconstruction technique. CONTRAST:  OMNIPAQUE IOHEXOL 300 MG/ML  SOLN COMPARISON:  Same day abdominal radiograph and CT abdomen and pelvis 09/25/2022 FINDINGS: Lower chest: No acute abnormality. Hepatobiliary: Hepatic steatosis. Cholecystectomy. No biliary dilation. Pancreas: Unremarkable. Spleen: Unremarkable. Adrenals/Urinary Tract: Normal adrenal glands. No urinary calculi or hydronephrosis. Bladder is unremarkable. Stomach/Bowel: Moderate colonic stool burden. No evidence of obstruction. Wall thickening and pericolonic stranding about the sigmoid colon compatible with colitis. Small hiatal hernia. Appendix is not visualized. Vascular/Lymphatic: No significant vascular findings are present. No enlarged abdominal or pelvic lymph nodes. Reproductive: Hysterectomy. Other: Small volume free fluid in the pelvis. No free intraperitoneal air. No abscess. Musculoskeletal: No acute fracture. IMPRESSION: 1. Sigmoid colitis, likely infectious/inflammatory. 2. Hepatic steatosis. Electronically Signed   By: Minerva Fester M.D.   On: 09/11/2023 02:47   DG Abdomen 1 View  Result Date: 09/11/2023 CLINICAL DATA:  Constipation EXAM: ABDOMEN - 1 VIEW COMPARISON:  None Available. FINDINGS: Nonobstructive bowel gas pattern. Moderate colonic stool burden, suggesting constipation. Cholecystectomy clips. IMPRESSION: Moderate colonic stool burden, suggesting constipation. Electronically Signed   By: Charline Bills M.D.   On: 09/11/2023 00:42     PROCEDURES:  Critical Care performed: No   CRITICAL CARE Performed by: Baxter Hire Fleda Pagel   Total critical care time: 0 minutes  Critical care time was exclusive of separately billable procedures and treating other patients.  Critical care was necessary to treat or prevent imminent or life-threatening deterioration.  Critical care was time spent personally by me on the  following activities: development of treatment plan with patient and/or surrogate as well as nursing, discussions with consultants, evaluation of patient's response to treatment, examination of patient, obtaining history from patient or surrogate, ordering and performing treatments and interventions, ordering and review of laboratory studies, ordering and review of radiographic studies, pulse oximetry and re-evaluation of patient's condition.   Marland Kitchen1-3 Lead EKG Interpretation  Performed by: Emani Taussig, Layla Maw, DO Authorized by: Caeley Dohrmann, Layla Maw, DO     Interpretation: abnormal     ECG rate:  113   ECG rate assessment: tachycardic     Rhythm: other rhythm     Rhythm comment:  Atrial tachycardia   Ectopy: none     Conduction: normal       IMPRESSION / MDM / ASSESSMENT AND PLAN / ED COURSE  I reviewed the triage vital signs and the nursing notes.    Patient here with complaints of abdominal pain, vomiting.  The patient is on the cardiac monitor to evaluate for evidence of arrhythmia and/or significant heart rate changes.   DIFFERENTIAL DIAGNOSIS (includes but not limited to):   Bowel obstruction, ileus, constipation, UTI, colitis   Patient's presentation is most consistent with acute presentation with potential threat to life or bodily function.   PLAN: Will obtain labs, urine, CT of the abdomen pelvis.  Will give IV fluids, pain and nausea medicine.  Heart rate elevated but based on her multiple previous records this is her baseline.   MEDICATIONS GIVEN IN ED: Medications  piperacillin-tazobactam (ZOSYN) IVPB 3.375 g (3.375 g Intravenous New Bag/Given 09/11/23 0253)  morphine (PF) 4 MG/ML injection 4 mg (4 mg Intravenous Given 09/11/23 0218)  ondansetron (ZOFRAN) injection 4 mg (4 mg Intravenous Given 09/11/23 0218)  sodium chloride 0.9 % bolus 1,000 mL (1,000 mLs Intravenous New Bag/Given 09/11/23 0220)  potassium chloride SA (KLOR-CON M) CR tablet 40 mEq (40 mEq Oral Given 09/11/23  0219)  iohexol (OMNIPAQUE) 300 MG/ML  solution 100 mL (100 mLs Intravenous Contrast Given 09/11/23 0233)     ED COURSE: Labs show no leukocytosis.  Potassium of 2.9.  Getting oral replacement.  Normal LFTs and lipase.  Urine shows no infection today.  CT scan reviewed and interpreted by myself and the radiologist and shows sigmoid colitis.  Given her history of being immunocompromised, will start IV Zosyn and admit to the hospital.   CONSULTS:  Consulted and discussed patient's case with hospitalist, Dr. Para March.  I have recommended admission and consulting physician agrees and will place admission orders.  Patient (and family if present) agree with this plan.   I reviewed all nursing notes, vitals, pertinent previous records.  All labs, EKGs, imaging ordered have been independently reviewed and interpreted by myself.    OUTSIDE RECORDS REVIEWED: Reviewed recent admission to [facility] on 08/26/2023.       FINAL CLINICAL IMPRESSION(S) / ED DIAGNOSES   Final diagnoses:  Colitis     Rx / DC Orders   ED Discharge Orders     None        Note:  This document was prepared using Dragon voice recognition software and may include unintentional dictation errors.   Kenzi Bardwell, Layla Maw, DO 09/11/23 [phone]

## 2023-09-11 NOTE — Assessment & Plan Note (Signed)
Patient not having diarrhea

## 2023-09-11 NOTE — Assessment & Plan Note (Signed)
Continue Symbicort.  DuoNebs as needed

## 2023-09-12 DIAGNOSIS — K529 Noninfective gastroenteritis and colitis, unspecified: Secondary | ICD-10-CM | POA: Diagnosis not present

## 2023-09-12 LAB — CBC
HCT: 36.6 % (ref 36.0–46.0)
Hemoglobin: 12.3 g/dL (ref 12.0–15.0)
MCH: 30 pg (ref 26.0–34.0)
MCHC: 33.6 g/dL (ref 30.0–36.0)
MCV: 89.3 fL (ref 80.0–100.0)
Platelets: 296 10*3/uL (ref 150–400)
RBC: 4.1 MIL/uL (ref 3.87–5.11)
RDW: 13.2 % (ref 11.5–15.5)
WBC: 7.1 10*3/uL (ref 4.0–10.5)
nRBC: 0 % (ref 0.0–0.2)

## 2023-09-12 LAB — BASIC METABOLIC PANEL
Anion gap: 11 (ref 5–15)
BUN: 6 mg/dL (ref 6–20)
CO2: 26 mmol/L (ref 22–32)
Calcium: 9 mg/dL (ref 8.9–10.3)
Chloride: 105 mmol/L (ref 98–111)
Creatinine, Ser: 0.89 mg/dL (ref 0.44–1.00)
GFR, Estimated: >60 mL/min (ref >60)
Glucose, Bld: 120 mg/dL — ABNORMAL HIGH (ref 70–99)
Potassium: 3.1 mmol/L — ABNORMAL LOW (ref 3.5–5.1)
Sodium: 142 mmol/L (ref 135–145)

## 2023-09-12 MED ORDER — PROMETHAZINE (PHENERGAN) 6.25MG IN NS 50ML IVPB
6.2500 mg | Freq: Four times a day (QID) | INTRAVENOUS | Status: DC | PRN
  Administered 2023-09-12 – 2023-09-18 (×11): 6.25 mg via INTRAVENOUS
  Filled 2023-09-12: qty 50
  Filled 2023-09-12 (×2): qty 6.25
  Filled 2023-09-12 (×4): qty 50
  Filled 2023-09-12: qty 6.25
  Filled 2023-09-12 (×4): qty 50

## 2023-09-12 MED ORDER — DICYCLOMINE HCL 10 MG PO CAPS
10.0000 mg | ORAL_CAPSULE | Freq: Every day | ORAL | Status: DC | PRN
  Administered 2023-09-13 – 2023-09-18 (×3): 10 mg via ORAL
  Filled 2023-09-12 (×4): qty 1

## 2023-09-12 MED ORDER — FAMOTIDINE 20 MG PO TABS
40.0000 mg | ORAL_TABLET | Freq: Every day | ORAL | Status: DC
  Administered 2023-09-12 – 2023-09-20 (×9): 40 mg via ORAL
  Filled 2023-09-12 (×9): qty 2

## 2023-09-12 MED ORDER — ENOXAPARIN SODIUM 40 MG/0.4ML IJ SOSY
40.0000 mg | PREFILLED_SYRINGE | INTRAMUSCULAR | Status: DC
  Administered 2023-09-13 – 2023-09-21 (×9): 40 mg via SUBCUTANEOUS
  Filled 2023-09-12 (×9): qty 0.4

## 2023-09-12 MED ORDER — SERTRALINE HCL 50 MG PO TABS
25.0000 mg | ORAL_TABLET | Freq: Every day | ORAL | Status: DC
  Administered 2023-09-12 – 2023-09-21 (×10): 25 mg via ORAL
  Filled 2023-09-12 (×10): qty 1

## 2023-09-12 MED ORDER — LACTULOSE 10 GM/15ML PO SOLN
20.0000 g | Freq: Every day | ORAL | Status: DC
  Filled 2023-09-12 (×2): qty 30

## 2023-09-12 MED ORDER — TOPIRAMATE 25 MG PO TABS
25.0000 mg | ORAL_TABLET | Freq: Every day | ORAL | Status: DC
  Administered 2023-09-12 – 2023-09-20 (×9): 25 mg via ORAL
  Filled 2023-09-12 (×9): qty 1

## 2023-09-12 MED ORDER — VALACYCLOVIR HCL 500 MG PO TABS: 1000.0000 mg | ORAL_TABLET | Freq: Every day | ORAL | Status: DC

## 2023-09-12 MED ORDER — POTASSIUM CHLORIDE CRYS ER 20 MEQ PO TBCR
40.0000 meq | EXTENDED_RELEASE_TABLET | Freq: Two times a day (BID) | ORAL | Status: AC
  Administered 2023-09-12 (×2): 40 meq via ORAL
  Filled 2023-09-12 (×2): qty 2

## 2023-09-12 MED ORDER — HYDROXYZINE HCL 10 MG PO TABS
10.0000 mg | ORAL_TABLET | Freq: Two times a day (BID) | ORAL | Status: DC
  Administered 2023-09-12 – 2023-09-17 (×12): 10 mg via ORAL
  Filled 2023-09-12 (×13): qty 1

## 2023-09-12 MED ORDER — LACTULOSE 10 GM/15ML PO SOLN: 20.0000 g | Freq: Every day | ORAL | Status: DC | PRN

## 2023-09-12 MED ORDER — TOPIRAMATE 100 MG PO TABS
100.0000 mg | ORAL_TABLET | Freq: Every day | ORAL | Status: DC
  Administered 2023-09-12 – 2023-09-21 (×10): 100 mg via ORAL
  Filled 2023-09-12 (×10): qty 1

## 2023-09-12 MED ORDER — PROMETHAZINE HCL 25 MG PO TABS
25.0000 mg | ORAL_TABLET | Freq: Four times a day (QID) | ORAL | Status: DC | PRN
  Administered 2023-09-18 – 2023-09-19 (×2): 25 mg via ORAL
  Filled 2023-09-12 (×3): qty 1

## 2023-09-12 MED ORDER — CYCLOSPORINE 0.05 % OP EMUL
1.0000 [drp] | Freq: Two times a day (BID) | OPHTHALMIC | Status: DC
  Administered 2023-09-12 – 2023-09-21 (×19): 1 [drp] via OPHTHALMIC
  Filled 2023-09-12 (×19): qty 30

## 2023-09-12 MED ORDER — MORPHINE SULFATE (PF) 2 MG/ML IV SOLN
1.0000 mg | INTRAVENOUS | Status: DC | PRN
  Administered 2023-09-12 – 2023-09-19 (×24): 1 mg via INTRAVENOUS
  Filled 2023-09-12 (×25): qty 1

## 2023-09-12 NOTE — Progress Notes (Addendum)
PROGRESS NOTE  [name] Horn    DOB: Jun 26, 1976, 47 y.o.  XWR:[id number]    Code Status: Full Code   DOA: 09/11/2023   LOS: 1   Brief hospital course  [name] Horn is a 47 y.o. female with a PMH significant for Rheumatoid arthritis previously on methotrexate, migraines, atrial tachycardia, eosinophilic esophagitis, C. difficile colitis, antibody deficiency syndrome with multiple infections, previously on monthly IVIG until April 2024 when it was suspected to be the cause of aseptic meningitis that same month.  They presented from home to the ED on 09/11/2023 with abdominal pain, nausea, vomiting x 14 days. Denies hematemesis or hematochezia. Was seen outpatient and treated for UTI.   In the ED, it was found that they had Afebrile on arrival, BP 131/101. Tachycardic to 115 (patient states this is normal for her atrial tachycardia).  Significant findings included normal WBC and hemoglobin Potassium 2.9 with otherwise normal CMP and normal lipase Urinalysis unremarkable. CT abdomen and pelvis shows the following: IMPRESSION: 1. Sigmoid colitis, likely infectious/inflammatory. 2. Hepatic steatosis.  They were initially treated with IV fluids, morphine and started on Zosyn.   Patient was admitted to medicine service for further workup and management of sigmoid colitis as outlined in detail below.  09/12/23 -improving  Assessment & Plan  Principal Problem:   Acute colitis Active Problems:   Antibody deficiency syndrome (HCC)   History of asthma   Cardiac arrhythmia_nonsustained V. tach and atrial tachycardia   UTERINE CANCER, HX OF   History of Clostridium difficile infection   Rheumatoid arthritis (HCC)  Acute sigmoid colitis History of radiation proctitis/sigmoid colitis/C. difficile colitis Had a BM last night. No diarrhea. No signs of obstruction on CT. Pain has improved overall.  - Continue Zosyn - Clear liquids and advance diet as tolerated - continue  IV hydration, IV  antiemetics, pain meds - bowel regimen- moderate stool burden on initial CT abdomen scan and patient has significant abdominal surgery history  - lactulose daily   Antibody deficiency syndrome (HCC) Previously treated with IVIG until April 2024 when it was suspected to be the cause of aseptic meningitis that same month  Followed at [facility]  Hypokalemia- was unable to tolerate PO supplement on admission but is now tolerating some PO. K+ 3.1 today - continue to monitor and replete PRN   History of asthma Continue Symbicort DuoNebs as needed   Cardiac arrhythmia  nonsustained V. tach and atrial tachycardia- currently rate controlled and without home anti-arrhythmic medications.  Patient states she has baseline tachycardia to the 120s. Has been stable. Can dc tele.    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  VTE ppx: enoxaparin (LOVENOX) injection 42.5 mg Start: 09/11/23 0800  Diet:     Diet   Diet clear liquid Room service appropriate? Yes; Fluid consistency: Thin   Consultants: None   Subjective 09/12/23    Pt reports feeling improved. She expresses worries about bowel perforation and becoming septic as this has happened to her before, she states. Had a normal BM overnight. Abdominal pain is improving. Able to tolerate some jello this am.    Objective   Vitals:   09/11/23 1511 09/11/23 2036 09/11/23 2343 09/12/23  0439  BP: 120/74 124/87 137/79 (!) 144/91  Pulse: 88 92 88 88  Resp: 16 18 18 18   Temp: 99.4 F (37.4 C) 97.9 F (36.6 C) 99.7 F (37.6 C) 99.1 F (37.3 C)  TempSrc: Oral Oral Oral Oral  SpO2: 96% 97% 96% 98%  Weight:      Height:         Intake/Output Summary (Last 24 hours) at 09/12/2023 0726 Last data filed at 09/11/2023 1322 Gross per 24 hour  Intake 1019.71 ml  Output --  Net 1019.71 ml   Filed Weights   09/10/23 2340  Weight: 83 kg     Physical Exam:  General: awake, alert, NAD HEENT: atraumatic, clear conjunctiva, anicteric sclera, MMM, hearing grossly normal Respiratory: normal respiratory effort. Cardiovascular: quick capillary refill, normal S1/S2, RRR, no JVD, murmurs Gastrointestinal: soft, peri-umbilical tenderness. Superficial bruising from injections. No masses. Nervous: A&O x3. no gross focal neurologic deficits, normal speech Extremities: moves all equally, no edema, normal tone Skin: dry, intact, normal temperature, normal color. No rashes, lesions or ulcers on exposed skin Psychiatry: anxious mood, congruent affect  Labs   I have personally reviewed the following labs and imaging studies CBC    Component Value Date/Time   WBC 7.1 09/12/2023 0532   RBC 4.10 09/12/2023 0532   HGB 12.3 09/12/2023 0532   HGB 11.5 04/02/2020 1545   HCT 36.6 09/12/2023 0532   HCT 35.2 04/02/2020 1545   PLT 296 09/12/2023 0532   PLT 279 04/02/2020 1545   MCV 89.3 09/12/2023 0532   MCV 96 04/02/2020 1545   MCH 30.0 09/12/2023 0532   MCHC 33.6 09/12/2023 0532   RDW 13.2 09/12/2023 0532   RDW 12.8 04/02/2020 1545   LYMPHSABS 2.5 08/25/2023 0954   LYMPHSABS 2.5 04/02/2020 1545   MONOABS 0.8 08/25/2023 0954   EOSABS 0.0 08/25/2023 0954   EOSABS 0.5 (H) 04/02/2020 1545   BASOSABS 0.0 08/25/2023 0954   BASOSABS 0.1 04/02/2020 1545      Latest Ref Rng & Units 09/12/2023    5:32 AM 09/11/2023    3:35 PM 09/11/2023    4:11 AM  BMP  Glucose 70 - 99 mg/dL 696  295    BUN 6 - 20 mg/dL 6  <5    Creatinine 2.84 - 1.00 mg/dL 1.32  4.40  1.02   Sodium 135 - 145 mmol/L 142  140    Potassium 3.5 - 5.1 mmol/L 3.1  3.2    Chloride 98 - 111 mmol/L 105  105    CO2 22 - 32 mmol/L 26  25    Calcium 8.9 - 10.3 mg/dL  9.0  9.2      CT ABDOMEN PELVIS W CONTRAST  Result Date: 09/11/2023 CLINICAL DATA:  Abdominal pain and emesis.  On antibiotics for UTI. EXAM: CT ABDOMEN AND PELVIS WITH CONTRAST TECHNIQUE: Multidetector CT imaging of the abdomen and pelvis was performed using the standard protocol following bolus administration of intravenous contrast. RADIATION DOSE REDUCTION: This exam was performed according to the departmental dose-optimization program which includes automated exposure control, adjustment of the mA and/or kV according to patient size and/or use of iterative reconstruction technique. CONTRAST:  OMNIPAQUE IOHEXOL 300 MG/ML  SOLN COMPARISON:  Same day abdominal radiograph and CT abdomen and pelvis 09/25/2022 FINDINGS: Lower chest: No acute abnormality. Hepatobiliary: Hepatic steatosis. Cholecystectomy. No biliary dilation. Pancreas: Unremarkable. Spleen: Unremarkable. Adrenals/Urinary Tract: Normal adrenal glands. No urinary calculi or hydronephrosis. Bladder is unremarkable. Stomach/Bowel: Moderate colonic stool burden.  No evidence of obstruction. Wall thickening and pericolonic stranding about the sigmoid colon compatible with colitis. Small hiatal hernia. Appendix is not visualized. Vascular/Lymphatic: No significant vascular findings are present. No enlarged abdominal or pelvic lymph nodes. Reproductive: Hysterectomy. Other: Small volume free fluid in the pelvis. No free intraperitoneal air. No abscess. Musculoskeletal: No acute fracture. IMPRESSION: 1. Sigmoid colitis, likely infectious/inflammatory. 2. Hepatic steatosis. Electronically Signed   By: Minerva Fester M.D.   On: 09/11/2023 02:47   DG Abdomen 1 View  Result Date: 09/11/2023 CLINICAL DATA:  Constipation EXAM: ABDOMEN - 1 VIEW COMPARISON:  None Available. FINDINGS: Nonobstructive bowel gas pattern. Moderate colonic stool burden, suggesting constipation. Cholecystectomy clips. IMPRESSION: Moderate colonic stool burden, suggesting  constipation. Electronically Signed   By: Charline Bills M.D.   On: 09/11/2023 00:42    Disposition Plan & Communication  Patient status: Inpatient  Admitted From: Home Planned disposition location: Home Anticipated discharge date: 9/18 pending clinical improvement  Family Communication: partner at bedside    Author: Leeroy Bock, DO [facility] 09/12/2023, 7:26 AM   Available by Epic secure chat 7AM-7PM. If 7PM-7AM, please contact night-coverage.  [facility] contact information found on [email or web address].

## 2023-09-12 NOTE — Plan of Care (Signed)
Problem: Education: Goal: Knowledge of General Education information will improve Description: Including pain rating scale, medication(s)/side effects and non-pharmacologic comfort measures Outcome: Progressing   Problem: Health Behavior/Discharge Planning: Goal: Ability to manage health-related needs will improve Outcome: Progressing   Problem: Activity: Goal: Risk for activity intolerance will decrease Outcome: Progressing   Problem: Elimination: Goal: Will not experience complications related to bowel motility Outcome: Progressing

## 2023-09-13 DIAGNOSIS — K529 Noninfective gastroenteritis and colitis, unspecified: Secondary | ICD-10-CM | POA: Diagnosis not present

## 2023-09-13 MED ORDER — POTASSIUM CHLORIDE CRYS ER 20 MEQ PO TBCR
40.0000 meq | EXTENDED_RELEASE_TABLET | ORAL | Status: AC
  Administered 2023-09-13 (×2): 40 meq via ORAL
  Filled 2023-09-13 (×2): qty 2

## 2023-09-13 NOTE — Plan of Care (Signed)

## 2023-09-13 NOTE — Progress Notes (Addendum)
PROGRESS NOTE  [name] Horn    DOB: February 03, 1976, 47 y.o.  ZOX:[id number]    Code Status: Full Code   DOA: 09/11/2023   LOS: 2   Brief hospital course  [name] Horn is a 47 y.o. female with a PMH significant for Rheumatoid arthritis previously on methotrexate, migraines, atrial tachycardia, eosinophilic esophagitis, C. difficile colitis, antibody deficiency syndrome with multiple infections, previously on monthly IVIG until April 2024 when it was suspected to be the cause of aseptic meningitis that same month.  They presented from home to the ED on 09/11/2023 with abdominal pain, nausea, vomiting x 14 days. Denies hematemesis or hematochezia. Was seen outpatient and treated for UTI.   In the ED, it was found that they had Afebrile on arrival, BP 131/101. Tachycardic to 115 (patient states this is normal for her atrial tachycardia).  Significant findings included normal WBC and hemoglobin Potassium 2.9 with otherwise normal CMP and normal lipase Urinalysis unremarkable. CT abdomen and pelvis shows the following: IMPRESSION: 1. Sigmoid colitis, likely infectious/inflammatory. 2. Hepatic steatosis.  They were initially treated with IV fluids, morphine and started on Zosyn.   Patient was admitted to medicine service for further workup and management of sigmoid colitis as outlined in detail below.    Assessment & Plan  Principal Problem:   Acute colitis Active Problems:   Antibody deficiency syndrome (HCC)   History of asthma   Cardiac arrhythmia_nonsustained V. tach and atrial tachycardia   UTERINE CANCER, HX OF   History of Clostridium difficile infection   Rheumatoid arthritis (HCC)  Acute sigmoid colitis History of radiation proctitis/sigmoid colitis/C. difficile colitis Had a BM last night. No diarrhea. No signs of obstruction on CT. Pain has improved overall.  - Continue Zosyn - Advance diet to full liquids, further advancement as tolerated  - continue  IV hydration, IV  antiemetics, pain meds PRN - bowel regimen- moderate stool burden on initial CT abdomen scan and patient has significant abdominal surgery history  - lactulose daily   Antibody deficiency syndrome (HCC) Previously treated with IVIG until April 2024 when it was suspected to be the cause of aseptic meningitis that same month  Followed at [facility]  Hypokalemia- was unable to tolerate PO supplement on admission but is now tolerating some PO. K+ 3.0 today - oral K-Cl ordered for replacement - continue to monitor and replete PRN   History of asthma Continue Symbicort DuoNebs as needed   Cardiac arrhythmia  nonsustained V. tach and atrial tachycardia- currently rate controlled and without home anti-arrhythmic medications.  Patient states she has baseline tachycardia to the 120s. Has been stable. Can dc tele.    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 Complicates overall care and prognosis.  Recommend lifestyle modifications including physical activity and diet for weight loss and overall long-term health.  VTE ppx: enoxaparin (LOVENOX) injection 40 mg Start: 09/13/23 0800  Diet:     Diet   Diet full liquid Room service appropriate? Yes; Fluid consistency: Thin   Consultants: None   Subjective 09/13/23    Pt reports having mixed BM's with solid and loose stools.  Ongoing lower abdominal pains but N/V is improved.  Would like to  try full liquids this afternoon.  Apple juice tolerated this AM, states is first of any PO intake she's been able to keep down.   Objective   Vitals:   09/12/23 0857 09/12/23 1241 09/12/23 1956 09/13/23 0435  BP: 130/83  122/80 (!) 147/86 (!) 142/77  Pulse: 80 77 94 85  Resp: 17 18 18 18   Temp: 98.4 F (36.9 C) 98.6 F (37 C) 98.9 F (37.2 C) 99.3 F (37.4 C)  TempSrc:      SpO2: 95% 97% 98% 96%  Weight:      Height:       No intake or output data in the 24 hours ending 09/13/23 1453  Filed Weights   09/10/23 2340  Weight: 83 kg     Physical Exam:  General exam: awake, alert, no acute distress, mildly ill appearing HEENT: atraumatic, clear conjunctiva, anicteric sclera, moist mucus membranes, hearing grossly normal  Respiratory system: CTAB, no wheezes, rales or rhonchi, normal respiratory effort. Cardiovascular system: normal S1/S2, RRR, no pedal edema.   Gastrointestinal system: soft, mild tenderness without rebound or guarding, non-distended, +BS. Central nervous system: A&O x 3. no gross focal neurologic deficits, normal speech Extremities: moves all, no edema, normal tone Skin: dry, intact, normal temperature, normal color, No rashes, lesions or ulcers Psychiatry: normal mood, congruent affect, judgement and insight appear normal   Labs   I have personally reviewed the following labs and imaging studies CBC    Component Value Date/Time   WBC 7.1 09/12/2023 0532   RBC 4.10 09/12/2023 0532   HGB 12.3 09/12/2023 0532   HGB 11.5 04/02/2020 1545   HCT 36.6 09/12/2023 0532   HCT 35.2 04/02/2020 1545   PLT 296 09/12/2023 0532   PLT 279 04/02/2020 1545   MCV 89.3 09/12/2023 0532   MCV 96 04/02/2020 1545   MCH 30.0 09/12/2023 0532   MCHC 33.6 09/12/2023 0532   RDW 13.2 09/12/2023 0532   RDW 12.8 04/02/2020 1545   LYMPHSABS 2.5 08/25/2023 0954   LYMPHSABS 2.5 04/02/2020 1545   MONOABS 0.8 08/25/2023 0954   EOSABS 0.0 08/25/2023 0954   EOSABS 0.5 (H) 04/02/2020 1545   BASOSABS 0.0 08/25/2023 0954   BASOSABS 0.1 04/02/2020 1545      Latest Ref Rng & Units 09/13/2023    6:42 AM 09/12/2023    5:32 AM 09/11/2023    3:35 PM  BMP  Glucose 70 - 99 mg/dL 564  332  951   BUN 6 - 20  mg/dL 9  6  <5   Creatinine 8.84 - 1.00 mg/dL 1.66  0.63  0.16   Sodium 135 - 145 mmol/L 144  142  140   Potassium 3.5 - 5.1 mmol/L 3.0  3.1  3.2   Chloride 98 - 111 mmol/L 109  105  105   CO2 22 - 32 mmol/L 25  26  25    Calcium 8.9 - 10.3 mg/dL 9.0  9.0  9.2     No results found.  Disposition Plan & Communication  Patient status: Inpatient  Admitted From: Home Planned disposition location: Home Anticipated discharge date: 9/19-20 pending further clinical improvement  Family Communication: none at bedside    Author: Pennie Banter, DO [facility] 09/13/2023, 2:53 PM   Available by Epic secure chat 7AM-7PM. If 7PM-7AM, please contact night-coverage.  [facility] contact information found on [email or web address].

## 2023-09-13 NOTE — Progress Notes (Signed)
Pharmacy Antibiotic Note  [name] Horn is a 47 y.o. female admitted on 09/11/2023 with  intra-abdominal  (sigmoid colitis). Pharmacy has been consulted for Zosyn dosing.  Day 3 of antibiotics.   Plan: Continue piperacillin/tazobactam 3.375 grams every 8 hours (4 hour infusion)  Height: 5\' 4"  (162.6 cm) Weight: 83 kg (182 lb 15.7 oz) IBW/kg (Calculated) : 54.7  Temp (24hrs), Avg:98.9 F (37.2 C), Min:98.6 F (37 C), Max:99.3 F (37.4 C)  Recent Labs  Lab 09/07/23 1605 09/10/23 2340 09/11/23 0411 09/11/23 1535 09/12/23 0532 09/13/23 0642  WBC 12.0* 9.8 11.0*  --  7.1  --   CREATININE 0.95 0.90 0.96 0.84 0.89 0.90    Estimated Creatinine Clearance: 79.6 mL/min (by C-G formula based on SCr of 0.9 mg/dL).    Allergies  Allergen Reactions   Cyanoacrylate Hives   Other Itching and Rash    Dermabond surgical glue.  Dermabond surgical glue-blisters   Sulfa Antibiotics Anaphylaxis, Rash, Shortness Of Breath and Swelling    Angioedema (also)   Sulfonamide Derivatives Hives, Shortness Of Breath and Swelling    TONGUE SWELLS   Benadryl [Diphenhydramine Hcl] Other (See Comments)    Hyperactivity    Sulfamethoxazole     Other Reaction(s): Angioedema   Diphenhydramine    Diphenhydramine Hcl     Other Reaction(s): Other (See Comments)  HYPERACTIVITY, RESTLESS LEG   Silicone Rash    Watch band    Antimicrobials this admission: Piperacillin/tazobactam 9/16  >>    >>   Dose adjustments this admission: None   Microbiology results: None  Thank you for allowing pharmacy to be a part of this patient's care.  Elliot Gurney, PharmD, BCPS Clinical Pharmacist  09/13/2023 9:01 AM

## 2023-09-14 DIAGNOSIS — K529 Noninfective gastroenteritis and colitis, unspecified: Secondary | ICD-10-CM | POA: Diagnosis not present

## 2023-09-14 LAB — BASIC METABOLIC PANEL
Anion gap: 9 (ref 5–15)
BUN: 9 mg/dL (ref 6–20)
CO2: 22 mmol/L (ref 22–32)
Calcium: 9.2 mg/dL (ref 8.9–10.3)
Chloride: 110 mmol/L (ref 98–111)
Creatinine, Ser: 0.96 mg/dL (ref 0.44–1.00)
GFR, Estimated: >60 mL/min (ref >60)
Glucose, Bld: 112 mg/dL — ABNORMAL HIGH (ref 70–99)
Potassium: 3.6 mmol/L (ref 3.5–5.1)
Sodium: 141 mmol/L (ref 135–145)

## 2023-09-14 LAB — MAGNESIUM: Magnesium: 1.8 mg/dL (ref 1.7–2.4)

## 2023-09-14 MED ORDER — PANTOPRAZOLE SODIUM 40 MG PO TBEC
40.0000 mg | DELAYED_RELEASE_TABLET | Freq: Every day | ORAL | Status: DC
  Administered 2023-09-15 – 2023-09-21 (×7): 40 mg via ORAL
  Filled 2023-09-14 (×7): qty 1

## 2023-09-14 NOTE — Progress Notes (Signed)
PROGRESS NOTE  [name] Horn    DOB: January 23, 1976, 47 y.o.  [id number]    Code Status: Full Code   DOA: 09/11/2023   LOS: 3   Brief hospital course  [name] Horn is a 47 y.o. female with a PMH significant for Rheumatoid arthritis previously on methotrexate, migraines, atrial tachycardia, eosinophilic esophagitis, C. difficile colitis, antibody deficiency syndrome with multiple infections, previously on monthly IVIG until April 2024 when it was suspected to be the cause of aseptic meningitis that same month.  They presented from home to the ED on 09/11/2023 with abdominal pain, nausea, vomiting x 14 days. Denies hematemesis or hematochezia. Was seen outpatient and treated for UTI.   In the ED, it was found that they had Afebrile on arrival, BP 131/101. Tachycardic to 115 (patient states this is normal for her atrial tachycardia).  Significant findings included normal WBC and hemoglobin Potassium 2.9 with otherwise normal CMP and normal lipase Urinalysis unremarkable. CT abdomen and pelvis shows the following: IMPRESSION: 1. Sigmoid colitis, likely infectious/inflammatory. 2. Hepatic steatosis.  They were initially treated with IV fluids, morphine and started on Zosyn.   Patient was admitted to medicine service for further workup and management of sigmoid colitis as outlined in detail below.    Assessment & Plan  Principal Problem:   Acute colitis Active Problems:   Antibody deficiency syndrome (HCC)   History of asthma   Cardiac arrhythmia_nonsustained V. tach and atrial tachycardia   UTERINE CANCER, HX OF   History of Clostridium difficile infection   Rheumatoid arthritis (HCC)  Acute sigmoid colitis History of radiation proctitis/sigmoid colitis/C. difficile colitis Had a BM last night. No diarrhea. No signs of obstruction on CT. Pain has improved overall.  - Continue Zosyn until nausea/vomiting improves for transition to PO - Advance diet to full liquids, further  advancement as tolerated  - monitor hydration off IV fluids, encourage PO fluids - continue  IV antiemetics, pain meds PRN - bowel regimen- moderate stool burden on initial CT abdomen scan and patient has significant abdominal surgery history - lactulose daily   Antibody deficiency syndrome (HCC) Previously treated with IVIG until April 2024 when it was suspected to be the cause of aseptic meningitis that same month  Followed at [facility]  Hypokalemia- was unable to tolerate PO supplement on admission but is now tolerating some PO. K+ 3.0 today - oral K-Cl ordered for replacement - continue to monitor and replete PRN   History of asthma Continue Symbicort DuoNebs as needed   Cardiac arrhythmia  nonsustained V. tach and atrial tachycardia- currently rate controlled and without home anti-arrhythmic medications.  Patient states she has baseline tachycardia to the 120s. Has been stable. Can dc tele.    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 Complicates overall care and prognosis.  Recommend lifestyle modifications including physical activity and diet for weight loss and overall long-term health.  VTE ppx: enoxaparin (LOVENOX) injection 40 mg Start: 09/13/23 0800  Diet:     Diet   DIET SOFT Room service appropriate? Yes; Fluid consistency: Thin   Consultants: None   Subjective 09/14/23    Pt seen this AM, having recurrent nausea about to get medication.  She reports ongoing lower abdominal cramping pains.  Mixed stools with formed and loose stools continues.  Minimal PO intake tolerance, not ready to advance diet from full liquids yet.   Objective    Vitals:   09/13/23 1702 09/13/23 1939 09/14/23 0354 09/14/23 0919  BP: 118/78 (!) 140/84 (!) 150/86 (!) 147/95  Pulse: 81 66 62 66  Resp: 16 20 18 19   Temp: 98.4 F (36.9 C) 99.1 F (37.3 C) 99 F (37.2 C) 98.5 F (36.9 C)  TempSrc:  Oral Oral Oral  SpO2: 96% 97% 97% 96%  Weight:      Height:        Intake/Output Summary (Last 24 hours) at 09/14/2023 1524 Last data filed at 09/13/2023 2244 Gross per 24 hour  Intake 230.02 ml  Output --  Net 230.02 ml    Filed Weights   09/10/23 2340  Weight: 83 kg     Physical Exam:   General exam: awake, alert, no acute distress, mildly ill appearing HEENT: moist mucus membranes, hearing grossly normal  Respiratory system: on room air, normal respiratory effort. Cardiovascular system: RRR, no pedal edema.   Gastrointestinal system: soft,non-distended, lower abdominal tenderness on palpation no rebound mild guarding Central nervous system: A&O x 3. no gross focal neurologic deficits, normal speech Extremities: moves all, no edema, normal tone Skin: dry, intact, normal temperature Psychiatry: normal mood, congruent affect, judgement and insight appear normal    Labs   I have personally reviewed the following labs and imaging studies CBC    Component Value Date/Time   WBC 7.1 09/12/2023 0532   RBC 4.10 09/12/2023 0532   HGB 12.3 09/12/2023 0532   HGB 11.5 04/02/2020 1545   HCT 36.6 09/12/2023 0532   HCT 35.2 04/02/2020 1545   PLT 296 09/12/2023 0532   PLT 279 04/02/2020 1545   MCV 89.3 09/12/2023 0532   MCV 96 04/02/2020 1545   MCH 30.0 09/12/2023 0532   MCHC 33.6 09/12/2023 0532   RDW 13.2 09/12/2023 0532   RDW 12.8 04/02/2020 1545   LYMPHSABS 2.5 08/25/2023 0954   LYMPHSABS 2.5 04/02/2020 1545   MONOABS 0.8 08/25/2023 0954   EOSABS 0.0 08/25/2023 0954   EOSABS 0.5 (H) 04/02/2020 1545   BASOSABS 0.0 08/25/2023 0954   BASOSABS 0.1 04/02/2020 1545      Latest Ref Rng & Units 09/14/2023    5:43 AM 09/13/2023     6:42 AM 09/12/2023    5:32 AM  BMP  Glucose 70 - 99 mg/dL 540  981  191   BUN 6 - 20 mg/dL 9  9  6    Creatinine 0.44 - 1.00 mg/dL 4.78  2.95  6.21   Sodium 135 - 145 mmol/L 141  144  142   Potassium 3.5 - 5.1 mmol/L 3.6  3.0  3.1   Chloride 98 - 111 mmol/L 110  109  105   CO2 22 - 32 mmol/L 22  25  26    Calcium 8.9 - 10.3 mg/dL 9.2  9.0  9.0     No results found.   Disposition Plan & Communication  Patient status: Inpatient  Admitted From: Home Planned disposition location: Home Anticipated discharge date: 24-48 hours pending further clinical improvement  Family Communication: None. Pt able to update.   Author: Pennie Banter, DO [facility] 09/14/2023, 3:24 PM   Available by Epic secure chat 7AM-7PM. If 7PM-7AM, please contact night-coverage.  [facility] contact information found on [email or web address].

## 2023-09-14 NOTE — Progress Notes (Signed)
PHARMACIST - PHYSICIAN COMMUNICATION  DR:   Denton Lank  CONCERNING: IV to Oral Route Change Policy  RECOMMENDATION: This patient is receiving pantoprazole by the intravenous route.  Based on criteria approved by the Pharmacy and Therapeutics Committee, the intravenous medication is being converted to the equivalent oral dose form.   DESCRIPTION: These criteria include: The patient is eating (either orally or via tube) and/or has been taking other orally administered medications for a least 24 hours The patient has no evidence of active gastrointestinal bleeding or impaired GI absorption (gastrectomy, short bowel, patient on TNA or NPO).  If you have questions about this conversion, please contact the Pharmacy Department  []   [phone] )  [facility] [x]   [phone] )  [facility] []   [phone] )  [facility] []   [phone] )  [facility] []   [phone] )  [facility]     Elliot Gurney, PharmD, BCPS Clinical Pharmacist  09/14/2023 10:48 AM

## 2023-09-15 DIAGNOSIS — K529 Noninfective gastroenteritis and colitis, unspecified: Secondary | ICD-10-CM | POA: Diagnosis not present

## 2023-09-15 LAB — CBC
HCT: 37.1 % (ref 36.0–46.0)
Hemoglobin: 11.9 g/dL — ABNORMAL LOW (ref 12.0–15.0)
MCH: 29.7 pg (ref 26.0–34.0)
MCHC: 32.1 g/dL (ref 30.0–36.0)
MCV: 92.5 fL (ref 80.0–100.0)
Platelets: 286 10*3/uL (ref 150–400)
RBC: 4.01 MIL/uL (ref 3.87–5.11)
RDW: 13.3 % (ref 11.5–15.5)
WBC: 7.2 10*3/uL (ref 4.0–10.5)
nRBC: 0 % (ref 0.0–0.2)

## 2023-09-15 LAB — GASTROINTESTINAL PANEL BY PCR, STOOL (REPLACES STOOL CULTURE)

## 2023-09-15 LAB — BASIC METABOLIC PANEL
Anion gap: 10 (ref 5–15)
BUN: 10 mg/dL (ref 6–20)
CO2: 22 mmol/L (ref 22–32)
Calcium: 8.9 mg/dL (ref 8.9–10.3)
Chloride: 109 mmol/L (ref 98–111)
Creatinine, Ser: 0.82 mg/dL (ref 0.44–1.00)
GFR, Estimated: >60 mL/min (ref >60)
Glucose, Bld: 103 mg/dL — ABNORMAL HIGH (ref 70–99)
Potassium: 3.5 mmol/L (ref 3.5–5.1)
Sodium: 141 mmol/L (ref 135–145)

## 2023-09-15 LAB — C DIFFICILE (CDIFF) QUICK SCRN (NO PCR REFLEX)
C Diff antigen: NEGATIVE
C Diff interpretation: NOT DETECTED
C Diff toxin: NEGATIVE

## 2023-09-15 MED ORDER — LACTULOSE 10 GM/15ML PO SOLN: 20.0000 g | Freq: Every day | ORAL | Status: DC | PRN

## 2023-09-15 NOTE — Progress Notes (Addendum)
PROGRESS NOTE  [name] Horn    DOB: Feb 11, 1976, 47 y.o.  [id number]    Code Status: Full Code   DOA: 09/11/2023   LOS: 4   Brief hospital course  [name] Horn is a 47 y.o. female with a PMH significant for Rheumatoid arthritis previously on methotrexate, migraines, atrial tachycardia, eosinophilic esophagitis, C. difficile colitis, antibody deficiency syndrome with multiple infections, previously on monthly IVIG until April 2024 when it was suspected to be the cause of aseptic meningitis that same month.  They presented from home to the ED on 09/11/2023 with abdominal pain, nausea, vomiting x 14 days. Denies hematemesis or hematochezia. Was seen outpatient and treated for UTI.   In the ED, it was found that they had Afebrile on arrival, BP 131/101. Tachycardic to 115 (patient states this is normal for her atrial tachycardia).  Significant findings included normal WBC and hemoglobin Potassium 2.9 with otherwise normal CMP and normal lipase Urinalysis unremarkable. CT abdomen and pelvis shows the following: IMPRESSION: 1. Sigmoid colitis, likely infectious/inflammatory. 2. Hepatic steatosis.  They were initially treated with IV fluids, morphine and started on Zosyn.   Patient was admitted to medicine service for further workup and management of sigmoid colitis as outlined in detail below.    Assessment & Plan  Principal Problem:   Acute colitis Active Problems:   Antibody deficiency syndrome (HCC)   History of asthma   Cardiac arrhythmia_nonsustained V. tach and atrial tachycardia   UTERINE CANCER, HX OF   History of Clostridium difficile infection   Rheumatoid arthritis (HCC)  Acute sigmoid colitis History of radiation proctitis /Hx of sigmoid colitis / Hx of C. difficile colitis Had a BM last night. No diarrhea. No signs of obstruction on CT. Pain has improved overall.  - Continue Zosyn until nausea/vomiting improves for transition to PO - Advance diet to full  liquids, further advancement as tolerated  - monitor hydration off IV fluids, encourage PO fluids - continue  IV antiemetics, pain meds PRN - bowel regimen- moderate stool burden on initial CT abdomen scan and patient has significant abdominal surgery history - lactulose daily PRN (has been scheduled, make PRN given frequent watery stools) - negative for C diff and GI panel today (9/20) - after d/w ID   Antibody deficiency syndrome (HCC) Previously treated with IVIG until April 2024 when it was suspected to be the cause of aseptic meningitis that same month  Followed at [facility]  Hypokalemia- resolved with replacement. - continue to monitor and replete PRN   History of asthma Continue Symbicort DuoNebs as needed   Cardiac arrhythmia  nonsustained V. tach and atrial tachycardia- currently rate controlled and without home anti-arrhythmic medications.  Patient states she has baseline tachycardia to the 120s. Has been stable. Can dc tele.    Rheumatoid arthritis (HCC) Continue Flexeril and oxycodone   History of Clostridium difficile infection Having diarrhea and mixed stools related to acute colitis. 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 Complicates overall care and prognosis.  Recommend lifestyle modifications including physical activity and diet for weight loss and overall long-term health.  VTE ppx: enoxaparin (LOVENOX) injection 40 mg Start: 09/13/23 0800  Diet:     Diet  DIET SOFT Room service appropriate? Yes; Fluid consistency: Thin   Consultants: None   Subjective 09/15/23    Pt seen this AM.  She reports stools were mixed solid/loose but now more frequent and watery.  Having  ongoing nausea, reports better relief with phenergan.  Ongoing cramping lower abdominal pain with BM urgency.  Reports prior history of C diff.   Objective   Vitals:   09/14/23 1715 09/14/23 1948 09/15/23 0527 09/15/23 0824  BP: 127/83 116/74 113/67 114/74  Pulse: 75 73 70 73  Resp: 19 20 17  (!) 22  Temp: 98.2 F (36.8 C) 99.1 F (37.3 C) 97.8 F (36.6 C) 97.9 F (36.6 C)  TempSrc:  Oral    SpO2: 100% 98% 97% 95%  Weight:      Height:       No intake or output data in the 24 hours ending 09/15/23 1427   Filed Weights   09/10/23 2340  Weight: 83 kg     Physical Exam:   General exam: awake, alert, no acute distress, mildly ill appearing HEENT: moist mucus membranes, hearing grossly normal  Respiratory system: CTAB, no wheezes, rales or rhonchi, normal respiratory effort. Cardiovascular system: normal S1/S2, RRR,  no pedal edema.   Gastrointestinal system: soft, mildly distended, +bowel sounds, lower abdominal tenderness +guarding no rebound Central nervous system: A&O x 3. no gross focal neurologic deficits, normal speech Extremities: moves all, no edema, normal tone Skin: dry, intact, normal temperature Psychiatry: normal mood, congruent affect, judgement and insight appear normal     Labs   I have personally reviewed the following labs and imaging studies CBC    Component Value Date/Time   WBC 7.2 09/15/2023 1031   RBC 4.01 09/15/2023 1031   HGB 11.9 (L) 09/15/2023 1031   HGB 11.5 04/02/2020 1545   HCT 37.1 09/15/2023 1031   HCT 35.2 04/02/2020 1545   PLT 286 09/15/2023 1031   PLT 279 04/02/2020 1545   MCV 92.5 09/15/2023 1031   MCV 96 04/02/2020 1545   MCH 29.7 09/15/2023 1031   MCHC 32.1 09/15/2023 1031   RDW 13.3 09/15/2023 1031   RDW 12.8 04/02/2020 1545   LYMPHSABS 2.5 08/25/2023 0954   LYMPHSABS 2.5 04/02/2020 1545   MONOABS 0.8 08/25/2023 0954   EOSABS 0.0 08/25/2023 0954   EOSABS 0.5 (H) 04/02/2020 1545   BASOSABS 0.0 08/25/2023 0954    BASOSABS 0.1 04/02/2020 1545      Latest Ref Rng & Units 09/15/2023    5:17 AM 09/14/2023    5:43 AM 09/13/2023    6:42 AM  BMP  Glucose 70 - 99 mg/dL 629  528  413   BUN 6 - 20 mg/dL 10  9  9    Creatinine 0.44 - 1.00 mg/dL 2.44  0.10  2.72   Sodium 135 - 145 mmol/L 141  141  144   Potassium 3.5 - 5.1 mmol/L 3.5  3.6  3.0   Chloride 98 - 111 mmol/L 109  110  109   CO2 22 - 32 mmol/L 22  22  25    Calcium 8.9 - 10.3 mg/dL 8.9  9.2  9.0     No results found.   Disposition Plan & Communication  Patient status: Inpatient  Admitted From: Home Planned disposition location: Home Anticipated discharge date: 24-48 hours pending further clinical improvement  Family Communication: None. Pt able to update.   Author: Pennie Banter, DO [facility] 09/15/2023, 2:27 PM   Available by Epic secure  chat 7AM-7PM. If 7PM-7AM, please contact night-coverage.  [facility] contact information found on [email or web address].

## 2023-09-16 DIAGNOSIS — K529 Noninfective gastroenteritis and colitis, unspecified: Secondary | ICD-10-CM | POA: Diagnosis not present

## 2023-09-16 LAB — BASIC METABOLIC PANEL
Anion gap: 6 (ref 5–15)
BUN: 9 mg/dL (ref 6–20)
CO2: 22 mmol/L (ref 22–32)
Calcium: 9.1 mg/dL (ref 8.9–10.3)
Chloride: 110 mmol/L (ref 98–111)
Creatinine, Ser: 0.86 mg/dL (ref 0.44–1.00)
GFR, Estimated: >60 mL/min (ref >60)
Glucose, Bld: 105 mg/dL — ABNORMAL HIGH (ref 70–99)
Potassium: 3.2 mmol/L — ABNORMAL LOW (ref 3.5–5.1)
Sodium: 138 mmol/L (ref 135–145)

## 2023-09-16 LAB — MAGNESIUM: Magnesium: 1.8 mg/dL (ref 1.7–2.4)

## 2023-09-16 MED ORDER — POTASSIUM CHLORIDE CRYS ER 20 MEQ PO TBCR
40.0000 meq | EXTENDED_RELEASE_TABLET | Freq: Once | ORAL | Status: AC
  Administered 2023-09-16: 40 meq via ORAL
  Filled 2023-09-16: qty 2

## 2023-09-16 NOTE — Progress Notes (Signed)
PROGRESS NOTE  [name] Horn    DOB: 02/17/1976, 47 y.o.  BJY:[id number]    Code Status: Full Code   DOA: 09/11/2023   LOS: 5   Brief hospital course  [name] Horn is a 47 y.o. female with a PMH significant for Rheumatoid arthritis previously on methotrexate, migraines, atrial tachycardia, eosinophilic esophagitis, C. difficile colitis, antibody deficiency syndrome with multiple infections, previously on monthly IVIG until April 2024 when it was suspected to be the cause of aseptic meningitis that same month.  They presented from home to the ED on 09/11/2023 with abdominal pain, nausea, vomiting x 14 days. Denies hematemesis or hematochezia. Was seen outpatient and treated for UTI.   In the ED, it was found that they had Afebrile on arrival, BP 131/101. Tachycardic to 115 (patient states this is normal for her atrial tachycardia).  Significant findings included normal WBC and hemoglobin Potassium 2.9 with otherwise normal CMP and normal lipase Urinalysis unremarkable. CT abdomen and pelvis shows the following: IMPRESSION: 1. Sigmoid colitis, likely infectious/inflammatory. 2. Hepatic steatosis.  They were initially treated with IV fluids, morphine and started on Zosyn.   Patient was admitted to medicine service for further workup and management of sigmoid colitis as outlined in detail below.    Assessment & Plan  Principal Problem:   Acute colitis Active Problems:   Antibody deficiency syndrome (HCC)   History of asthma   Cardiac arrhythmia_nonsustained V. tach and atrial tachycardia   UTERINE CANCER, HX OF   History of Clostridium difficile infection   Rheumatoid arthritis (HCC)  Acute sigmoid colitis History of radiation proctitis /Hx of sigmoid colitis / Hx of C. difficile colitis Had a BM last night. No diarrhea. No signs of obstruction on CT. Pain has improved overall.  - Continue Zosyn until nausea/vomiting improves for transition to PO - Advance diet to full  liquids, further advancement as tolerated  - monitor hydration off IV fluids, encourage PO fluids - continue  IV antiemetics, pain meds PRN - bowel regimen- moderate stool burden on initial CT abdomen scan and patient has significant abdominal surgery history - negative for C diff and GI panel today (9/20) - after d/w ID   Antibody deficiency syndrome (HCC) Previously treated with IVIG until April 2024 when it was suspected to be the cause of aseptic meningitis that same month  Followed at [facility]  Hypokalemia- recurrent due to ongoing diarrhea. - PO K-Cl today for K 3.2 - continue to monitor and replete PRN   History of asthma Continue Symbicort DuoNebs as needed   Cardiac arrhythmia  nonsustained V. tach and atrial tachycardia- currently rate controlled and without home anti-arrhythmic medications.  Patient states she has baseline tachycardia to the 120s. Has been stable. Can dc tele.    Rheumatoid arthritis (HCC) Continue Flexeril and oxycodone   History of Clostridium difficile infection Having diarrhea and mixed stools related to acute colitis. 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 Complicates overall care and prognosis.  Recommend lifestyle modifications including physical activity and diet for weight loss and overall long-term health.  VTE ppx: enoxaparin (LOVENOX) injection 40 mg Start: 09/13/23 0800  Diet:     Diet   Diet regular Fluid  consistency: Thin   Consultants: None   Subjective 09/16/23    Pt seen this AM.  She reports ongoing frequent watery stools and urgency with BM's.  Cramping abdominal pain continues but improving.  Ongoing nausea but fairly controlled on meds.   Would like a baked potato but not allowed on soft / low fiber diet.  Agreeable to advance diet.    Objective   Vitals:   09/16/23 0456 09/16/23 0736 09/16/23 0745 09/16/23 1248  BP: 112/69 (!) 92/59 115/72 103/67  Pulse: 71 66 75 80  Resp: 16  20   Temp: 97.8 F (36.6 C) 98.6 F (37 C)    TempSrc:  Oral    SpO2: 96% 97% 99% 98%  Weight:      Height:        Intake/Output Summary (Last 24 hours) at 09/16/2023 1447 Last data filed at 09/16/2023 1439 Gross per 24 hour  Intake 240 ml  Output --  Net 240 ml     Filed Weights   09/10/23 2340  Weight: 83 kg     Physical Exam:   General exam: awake, alert, no acute distress HEENT: moist mucus membranes, hearing grossly normal  Respiratory system: on room air, normal respiratory effort. Cardiovascular system: RRR,  no pedal edema.   Gastrointestinal system: mildly distended, non-tender on palpation, +bowel sounds Central nervous system: A&O x 3. no gross focal neurologic deficits, normal speech Extremities: moves all, no edema, normal tone Skin: dry, intact, normal temperature Psychiatry: normal mood, congruent affect, judgement and insight appear normal     Labs   I have personally reviewed the following labs and imaging studies CBC    Component Value Date/Time   WBC 7.2 09/15/2023 1031   RBC 4.01 09/15/2023 1031   HGB 11.9 (L) 09/15/2023 1031   HGB 11.5 04/02/2020 1545   HCT 37.1 09/15/2023 1031   HCT 35.2 04/02/2020 1545   PLT 286 09/15/2023 1031   PLT 279 04/02/2020 1545   MCV 92.5 09/15/2023 1031   MCV 96 04/02/2020 1545   MCH 29.7 09/15/2023 1031   MCHC 32.1 09/15/2023 1031   RDW 13.3 09/15/2023 1031   RDW 12.8 04/02/2020 1545   LYMPHSABS 2.5 08/25/2023 0954   LYMPHSABS 2.5 04/02/2020 1545   MONOABS 0.8 08/25/2023 0954   EOSABS 0.0 08/25/2023 0954   EOSABS 0.5 (H) 04/02/2020 1545   BASOSABS 0.0 08/25/2023 0954   BASOSABS 0.1 04/02/2020 1545      Latest Ref Rng & Units 09/16/2023    4:38 AM  09/15/2023    5:17 AM 09/14/2023    5:43 AM  BMP  Glucose 70 - 99 mg/dL 409  811  914   BUN 6 - 20 mg/dL 9  10  9    Creatinine 0.44 - 1.00 mg/dL 7.82  9.56  2.13   Sodium 135 - 145 mmol/L 138  141  141   Potassium 3.5 - 5.1 mmol/L 3.2  3.5  3.6   Chloride 98 - 111 mmol/L 110  109  110   CO2 22 - 32 mmol/L 22  22  22    Calcium 8.9 - 10.3 mg/dL 9.1  8.9  9.2     No results found.   Disposition Plan & Communication  Patient status: Inpatient  Admitted From: Home Planned disposition location: Home Anticipated discharge date: 24-48 hours pending further clinical improvement  Family Communication: None. Pt able to update.   Author: Pennie Banter, DO [facility] 09/16/2023, 2:47 PM  Available by Epic secure chat 7AM-7PM. If 7PM-7AM, please contact night-coverage.  [facility] contact information found on [email or web address].

## 2023-09-16 NOTE — Plan of Care (Signed)

## 2023-09-17 DIAGNOSIS — K529 Noninfective gastroenteritis and colitis, unspecified: Secondary | ICD-10-CM | POA: Diagnosis not present

## 2023-09-17 LAB — BASIC METABOLIC PANEL
Anion gap: 6 (ref 5–15)
BUN: 9 mg/dL (ref 6–20)
CO2: 23 mmol/L (ref 22–32)
Calcium: 8.8 mg/dL — ABNORMAL LOW (ref 8.9–10.3)
Chloride: 110 mmol/L (ref 98–111)
Creatinine, Ser: 1 mg/dL (ref 0.44–1.00)
GFR, Estimated: >60 mL/min (ref >60)
Glucose, Bld: 103 mg/dL — ABNORMAL HIGH (ref 70–99)
Potassium: 4.3 mmol/L (ref 3.5–5.1)
Sodium: 139 mmol/L (ref 135–145)

## 2023-09-17 MED ORDER — PSYLLIUM 95 % PO PACK
1.0000 | PACK | Freq: Every day | ORAL | Status: DC
  Administered 2023-09-17: 1 via ORAL
  Filled 2023-09-17 (×5): qty 1

## 2023-09-17 NOTE — Plan of Care (Signed)
Patient medicated for abdominal pain and nausea throughout shift with short term relief. No distress noted. Will continue to monitor.   Problem: Education: Goal: Knowledge of General Education information will improve Description: Including pain rating scale, medication(s)/side effects and non-pharmacologic comfort measures Outcome: Progressing   Problem: Health Behavior/Discharge Planning: Goal: Ability to manage health-related needs will improve Outcome: Progressing   Problem: Clinical Measurements: Goal: Ability to maintain clinical measurements within normal limits will improve Outcome: Progressing Goal: Will remain free from infection Outcome: Progressing Goal: Diagnostic test results will improve Outcome: Progressing Goal: Respiratory complications will improve Outcome: Progressing Goal: Cardiovascular complication will be avoided Outcome: Progressing   Problem: Activity: Goal: Risk for activity intolerance will decrease Outcome: Progressing   Problem: Nutrition: Goal: Adequate nutrition will be maintained Outcome: Progressing   Problem: Coping: Goal: Level of anxiety will decrease Outcome: Progressing   Problem: Elimination: Goal: Will not experience complications related to bowel motility Outcome: Progressing Goal: Will not experience complications related to urinary retention Outcome: Progressing   Problem: Pain Managment: Goal: General experience of comfort will improve Outcome: Progressing   Problem: Safety: Goal: Ability to remain free from injury will improve Outcome: Progressing   Problem: Skin Integrity: Goal: Risk for impaired skin integrity will decrease Outcome: Progressing

## 2023-09-17 NOTE — Progress Notes (Signed)
PROGRESS NOTE  [name] Horn    DOB: 24-Oct-1976, 47 y.o.  ZOX:[id number]    Code Status: Full Code   DOA: 09/11/2023   LOS: 6   Brief hospital course  [name] Horn is a 47 y.o. female with a PMH significant for Rheumatoid arthritis previously on methotrexate, migraines, atrial tachycardia, eosinophilic esophagitis, C. difficile colitis, antibody deficiency syndrome with multiple infections, previously on monthly IVIG until April 2024 when it was suspected to be the cause of aseptic meningitis that same month.  They presented from home to the ED on 09/11/2023 with abdominal pain, nausea, vomiting x 14 days. Denies hematemesis or hematochezia. Was seen outpatient and treated for UTI.   In the ED, it was found that they had Afebrile on arrival, BP 131/101. Tachycardic to 115 (patient states this is normal for her atrial tachycardia).  Significant findings included normal WBC and hemoglobin Potassium 2.9 with otherwise normal CMP and normal lipase Urinalysis unremarkable. CT abdomen and pelvis shows the following: IMPRESSION: 1. Sigmoid colitis, likely infectious/inflammatory. 2. Hepatic steatosis.  They were initially treated with IV fluids, morphine and started on Zosyn.   Patient was admitted to medicine service for further workup and management of sigmoid colitis as outlined in detail below.    Assessment & Plan  Principal Problem:   Acute colitis Active Problems:   Antibody deficiency syndrome (HCC)   History of asthma   Cardiac arrhythmia_nonsustained V. tach and atrial tachycardia   UTERINE CANCER, HX OF   History of Clostridium difficile infection   Rheumatoid arthritis (HCC)  Acute sigmoid colitis History of radiation proctitis /Hx of sigmoid colitis / Hx of C. difficile colitis Had a BM last night. No diarrhea. No signs of obstruction on CT. Pain has improved overall.  - Completed 7 days course Zosyn  - Monitor off antibiotics (pt has hx of C diff, minimize abx  exposure) - Monitor fever curve, CBC - On regular diet as tolerated - monitor hydration off IV fluids, encourage PO fluids - continue  IV antiemetics, pain meds PRN - bowel regimen initially scheduled, now PRN due to frequent watery stools   - negative for C diff and GI panel today (9/20) - after d/w ID   Antibody deficiency syndrome (HCC) Previously treated with IVIG until April 2024 when it was suspected to be the cause of aseptic meningitis that same month  Followed at [facility]  Hypokalemia- recurrent due to ongoing diarrhea. No replacement needed today. - continue to monitor and replete PRN   History of asthma Continue Symbicort DuoNebs as needed   Cardiac arrhythmia  nonsustained V. tach and atrial tachycardia- currently rate controlled and without home anti-arrhythmic medications.  Patient states she has baseline tachycardia to the 120s. Has been stable. Can dc tele.    Rheumatoid arthritis (HCC) Continue Flexeril and oxycodone   History of Clostridium difficile infection Having diarrhea and mixed stools related to acute colitis. 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 Complicates overall care and prognosis.  Recommend lifestyle modifications including physical activity and diet for weight loss and overall long-term health.  VTE ppx: enoxaparin (LOVENOX) injection 40 mg Start: 09/13/23 0800  Diet:     Diet  Diet regular Fluid consistency: Thin   Consultants: None   Subjective 09/17/23    Pt seen this AM.  Reports ongoing frequent watery stools and urgency. Didn't make to toilet one episode overnight.  Tolerating diet okay with ongoing nausea but not vomiting.   Objective    Vitals:   09/16/23 1248 09/16/23 1703 09/16/23 2134 09/17/23 0512  BP: 103/67 105/70 (!) 87/70 120/87  Pulse: 80 69 78 79  Resp:  16 16 16   Temp:  98.5 F (36.9 C) 97.7 F (36.5 C) 97.8 F (36.6 C)  TempSrc:   Oral Oral  SpO2: 98% 100% 99% 100%  Weight:      Height:        Intake/Output Summary (Last 24 hours) at 09/17/2023 1423 Last data filed at 09/17/2023 1059 Gross per 24 hour  Intake 1244.13 ml  Output --  Net 1244.13 ml     Filed Weights   09/10/23 2340  Weight: 83 kg     Physical Exam:   General exam: awake, alert, no acute distress HEENT: moist mucus membranes, hearing grossly normal  Respiratory system: on room air, normal respiratory effort. Lungs clear Cardiovascular system: RRR,  no pedal edema.   Gastrointestinal system: abdomen mildly distended, mild lower abd tenderness, no guarding or rebound, hyperactive bowel sounds Central nervous system: A&O x 3. no gross focal neurologic deficits, normal speech Extremities: moves all, no edema, normal tone Skin: dry, intact, normal temperature Psychiatry: normal mood, congruent affect, judgement and insight appear normal     Labs   I have personally reviewed the following labs and imaging studies CBC    Component Value Date/Time   WBC 7.2 09/15/2023 1031   RBC 4.01 09/15/2023 1031   HGB 11.9 (L) 09/15/2023 1031   HGB 11.5 04/02/2020 1545   HCT 37.1 09/15/2023 1031   HCT 35.2 04/02/2020 1545   PLT 286 09/15/2023 1031   PLT 279 04/02/2020 1545   MCV 92.5 09/15/2023 1031   MCV 96 04/02/2020 1545   MCH 29.7 09/15/2023 1031   MCHC 32.1 09/15/2023 1031   RDW 13.3 09/15/2023 1031   RDW 12.8 04/02/2020 1545   LYMPHSABS 2.5 08/25/2023 0954   LYMPHSABS 2.5 04/02/2020 1545   MONOABS 0.8 08/25/2023 0954   EOSABS 0.0 08/25/2023 0954   EOSABS 0.5 (H) 04/02/2020 1545   BASOSABS 0.0 08/25/2023 0954   BASOSABS 0.1 04/02/2020 1545      Latest Ref Rng & Units 09/17/2023    4:57 AM 09/16/2023    4:38 AM  09/15/2023    5:17 AM  BMP  Glucose 70 - 99 mg/dL 604  540  981   BUN 6 - 20 mg/dL 9  9  10    Creatinine 0.44 - 1.00 mg/dL 1.91  4.78  2.95   Sodium 135 - 145 mmol/L 139  138  141   Potassium 3.5 - 5.1 mmol/L 4.3  3.2  3.5   Chloride 98 - 111 mmol/L 110  110  109   CO2 22 - 32 mmol/L 23  22  22    Calcium 8.9 - 10.3 mg/dL 8.8  9.1  8.9     No results found.   Disposition Plan & Communication  Patient status: Inpatient  Admitted From: Home Planned disposition location: Home Anticipated discharge date: 24-48 hours pending further clinical improvement Pt having ongoing nausea & pain requiring IV therapies  Family Communication: None. Pt able to update.   Author: Pennie Banter, DO [facility] 09/17/2023, 2:23 PM  Available by Epic secure chat 7AM-7PM. If 7PM-7AM, please contact night-coverage.  [facility] contact information found on [email or web address].

## 2023-09-17 NOTE — Plan of Care (Signed)

## 2023-09-18 ENCOUNTER — Inpatient Hospital Stay: Payer: Medicaid Other

## 2023-09-18 DIAGNOSIS — K529 Noninfective gastroenteritis and colitis, unspecified: Secondary | ICD-10-CM | POA: Diagnosis not present

## 2023-09-18 LAB — BASIC METABOLIC PANEL
Anion gap: 6 (ref 5–15)
BUN: 8 mg/dL (ref 6–20)
CO2: 24 mmol/L (ref 22–32)
Calcium: 9.1 mg/dL (ref 8.9–10.3)
Chloride: 108 mmol/L (ref 98–111)
Creatinine, Ser: 0.83 mg/dL (ref 0.44–1.00)
GFR, Estimated: >60 mL/min (ref >60)
Glucose, Bld: 110 mg/dL — ABNORMAL HIGH (ref 70–99)
Potassium: 3.9 mmol/L (ref 3.5–5.1)
Sodium: 138 mmol/L (ref 135–145)

## 2023-09-18 MED ORDER — IOHEXOL 300 MG/ML  SOLN
100.0000 mL | Freq: Once | INTRAMUSCULAR | Status: AC | PRN
  Administered 2023-09-18: 100 mL via INTRAVENOUS

## 2023-09-18 MED ORDER — HYDROXYZINE HCL 50 MG PO TABS
25.0000 mg | ORAL_TABLET | Freq: Two times a day (BID) | ORAL | Status: DC
  Administered 2023-09-18 – 2023-09-21 (×7): 25 mg via ORAL
  Filled 2023-09-18 (×7): qty 1

## 2023-09-18 NOTE — Progress Notes (Signed)
Pt with a total of 4 liquid stools so far today.

## 2023-09-18 NOTE — Plan of Care (Signed)

## 2023-09-18 NOTE — Progress Notes (Addendum)
PROGRESS NOTE  [name] Horn    DOB: [birth year], 47 y.o.  [id number]    Code Status: Full Code   DOA: 09/11/2023   LOS: 7   Brief hospital course  [name] Horn is a 47 y.o. female with a PMH significant for Rheumatoid arthritis previously on methotrexate, migraines, atrial tachycardia, eosinophilic esophagitis, C. difficile colitis, antibody deficiency syndrome with multiple infections, previously on monthly IVIG until April 2024 when it was suspected to be the cause of aseptic meningitis that same month.  They presented from home to the ED on 09/11/2023 with abdominal pain, nausea, vomiting x 14 days. Denies hematemesis or hematochezia. Was seen outpatient and treated for UTI.   In the ED, it was found that they had Afebrile on arrival, BP 131/101. Tachycardic to 115 (patient states this is normal for her atrial tachycardia).  Significant findings included normal WBC and hemoglobin Potassium 2.9 with otherwise normal CMP and normal lipase Urinalysis unremarkable. CT abdomen and pelvis shows the following: IMPRESSION: 1. Sigmoid colitis, likely infectious/inflammatory. 2. Hepatic steatosis.  They were initially treated with IV fluids, morphine and started on Zosyn.   Patient was admitted to medicine service for further workup and management of sigmoid colitis as outlined in detail below.    Assessment & Plan  Principal Problem:   Acute colitis Active Problems:   Antibody deficiency syndrome (HCC)   History of asthma   Cardiac arrhythmia_nonsustained V. tach and atrial tachycardia   UTERINE CANCER, HX OF   History of Clostridium difficile infection   Rheumatoid arthritis (HCC)  Acute sigmoid colitis History of radiation proctitis /Hx of sigmoid colitis / Hx of C. difficile colitis Had a BM last night. No diarrhea. No signs of obstruction on CT. Pain has improved overall.  - 9/24 - repeat CT abdomen/pelvis given ongoing pain, need to rule out potential complications -  Completed 7 days course Zosyn  - Monitor off antibiotics (pt has hx of C diff, minimize abx exposure) - Monitor fever curve, CBC - On regular diet as tolerated - monitor hydration off IV fluids, encourage PO fluids - continue  IV antiemetics, pain meds PRN - bowel regimen initially scheduled, now PRN due to frequent watery stools   - negative for C diff and GI panel today (9/20) - after d/w ID   Antibody deficiency syndrome (HCC) Previously treated with IVIG until April 2024 when it was suspected to be the cause of aseptic meningitis that same month  Followed at [facility]  Hypokalemia- recurrent due to ongoing diarrhea. No replacement needed today. - continue to monitor and replete PRN   History of asthma Continue Symbicort DuoNebs as needed   Cardiac arrhythmia  nonsustained V. tach and atrial tachycardia- currently rate controlled and without home anti-arrhythmic medications.  Patient states she has baseline tachycardia to the 120s. Has been stable. Can dc tele.    Rheumatoid arthritis (HCC) Continue Flexeril and oxycodone   History of Clostridium difficile infection Having diarrhea and mixed stools related to acute colitis. 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 Complicates overall care and prognosis.  Recommend lifestyle modifications including physical activity and diet for weight loss and overall long-term health.  VTE ppx: enoxaparin (LOVENOX)  injection 40 mg Start: 09/13/23 0800  Diet:     Diet   Diet regular Fluid consistency: Thin   Consultants: None   Subjective 09/18/23    Pt seen this AM.  Reports ongoing frequent stools, up many times overnight.  Metamucil tolerated well,  briefly had some semi-formed stools, now watery again, and frequent.  Intermittent nausea, not vomiting. No fever or chills.  Overall feels like she's slowly improving.     Objective   Vitals:   09/17/23 2008 09/18/23 0411 09/18/23 0825 09/18/23 1601  BP: 105/63 103/80 103/64 112/67  Pulse: 82 84 73 79  Resp: 18  18 17   Temp: 98 F (36.7 C) 98 F (36.7 C) 98.5 F (36.9 C) 98.2 F (36.8 C)  TempSrc: Oral Oral Oral   SpO2: 97% 96% 96% 98%  Weight:      Height:        Intake/Output Summary (Last 24 hours) at 09/18/2023 1644 Last data filed at 09/18/2023 0300 Gross per 24 hour  Intake 340 ml  Output --  Net 340 ml     Filed Weights   09/10/23 2340  Weight: 83 kg     Physical Exam:   General exam: awake, alert, no acute distress HEENT: moist mucus membranes, hearing grossly normal  Respiratory system: on room air, normal respiratory effort. Lungs clear Cardiovascular system: RRR,  no pedal edema.   Gastrointestinal system: abdomen less distended, ongoing mild tenderness without rebound or guarding, hyperactive bowel sounds Central nervous system: A&O x 3. no gross focal neurologic deficits, normal speech Extremities: moves all, no edema, normal tone Skin: dry, intact, normal temperature Psychiatry: normal mood, congruent affect, judgement and insight appear normal     Labs   I have personally reviewed the following labs and imaging studies CBC    Component Value Date/Time   WBC 7.2 09/15/2023 1031   RBC 4.01 09/15/2023 1031   HGB 11.9 (L) 09/15/2023 1031   HGB 11.5 04/02/2020 1545   HCT 37.1 09/15/2023 1031   HCT 35.2 04/02/2020 1545   PLT 286 09/15/2023 1031   PLT 279 04/02/2020 1545   MCV 92.5 09/15/2023 1031   MCV 96 04/02/2020 1545   MCH 29.7 09/15/2023 1031   MCHC 32.1 09/15/2023 1031   RDW 13.3 09/15/2023 1031   RDW 12.8 04/02/2020 1545   LYMPHSABS 2.5 08/25/2023 0954   LYMPHSABS 2.5 04/02/2020 1545   MONOABS 0.8 08/25/2023 0954   EOSABS 0.0  08/25/2023 0954   EOSABS 0.5 (H) 04/02/2020 1545   BASOSABS 0.0 08/25/2023 0954   BASOSABS 0.1 04/02/2020 1545      Latest Ref Rng & Units 09/18/2023    6:29 AM 09/17/2023    4:57 AM 09/16/2023    4:38 AM  BMP  Glucose 70 - 99 mg/dL 811  914  782   BUN 6 - 20 mg/dL 8  9  9    Creatinine 0.44 - 1.00 mg/dL 9.56  2.13  0.86   Sodium 135 - 145 mmol/L 138  139  138   Potassium 3.5 - 5.1 mmol/L 3.9  4.3  3.2   Chloride 98 - 111 mmol/L 108  110  110   CO2 22 - 32 mmol/L 24  23  22    Calcium 8.9 - 10.3 mg/dL 9.1  8.8  9.1     No results found.   Disposition Plan & Communication  Patient status: Inpatient  Admitted From: Home Planned disposition location: Home Anticipated discharge date: 24-48 hours pending  further clinical improvement Pt having ongoing abdominal pain and freuquent diarrhea, requiring IV therapies and close monitoring of electolytes  Family Communication: None. Pt able to update.   Author: Pennie Banter, DO [facility] 09/18/2023, 4:44 PM   Available by Epic secure chat 7AM-7PM. If 7PM-7AM, please contact night-coverage.  [facility] contact information found on [email or web address].

## 2023-09-18 NOTE — Progress Notes (Signed)
Pt states she had 4 liquid bowel movements overnight. Still with significant pain and nausea.

## 2023-09-19 DIAGNOSIS — K529 Noninfective gastroenteritis and colitis, unspecified: Secondary | ICD-10-CM | POA: Diagnosis not present

## 2023-09-19 LAB — BASIC METABOLIC PANEL
Anion gap: 8 (ref 5–15)
BUN: 11 mg/dL (ref 6–20)
CO2: 23 mmol/L (ref 22–32)
Calcium: 9.1 mg/dL (ref 8.9–10.3)
Chloride: 106 mmol/L (ref 98–111)
Creatinine, Ser: 0.92 mg/dL (ref 0.44–1.00)
GFR, Estimated: >60 mL/min (ref >60)
Glucose, Bld: 93 mg/dL (ref 70–99)
Potassium: 3.9 mmol/L (ref 3.5–5.1)
Sodium: 137 mmol/L (ref 135–145)

## 2023-09-19 LAB — MAGNESIUM: Magnesium: 2 mg/dL (ref 1.7–2.4)

## 2023-09-19 LAB — LACTOFERRIN, FECAL, QUALITATIVE: Lactoferrin, Fecal, Qual: NEGATIVE

## 2023-09-19 LAB — C-REACTIVE PROTEIN: CRP: <0.5 mg/dL (ref <1.0)

## 2023-09-19 MED ORDER — HYDROCODONE-ACETAMINOPHEN 5-325 MG PO TABS
1.0000 | ORAL_TABLET | ORAL | Status: DC | PRN
  Administered 2023-09-19 – 2023-09-21 (×11): 2 via ORAL
  Filled 2023-09-19 (×11): qty 2

## 2023-09-19 MED ORDER — ONDANSETRON 4 MG PO TBDP: 8.0000 mg | ORAL_TABLET | Freq: Three times a day (TID) | ORAL | Status: DC | PRN

## 2023-09-19 NOTE — Progress Notes (Signed)
PROGRESS NOTE  [name] Horn    DOB: March 26, 1976, 47 y.o.  AVW:[id number]    Code Status: Full Code   DOA: 09/11/2023   LOS: 8   Brief hospital course  [name] Horn is a 47 y.o. female with a PMH significant for Rheumatoid arthritis previously on methotrexate, migraines, atrial tachycardia, eosinophilic esophagitis, C. difficile colitis, antibody deficiency syndrome with multiple infections, previously on monthly IVIG until April 2024 when it was suspected to be the cause of aseptic meningitis that same month.  They presented from home to the ED on 09/11/2023 with abdominal pain, nausea, vomiting x 14 days. Denies hematemesis or hematochezia. Was seen outpatient and treated for UTI.   In the ED, it was found that they had Afebrile on arrival, BP 131/101. Tachycardic to 115 (patient states this is normal for her atrial tachycardia).  Significant findings included normal WBC and hemoglobin Potassium 2.9 with otherwise normal CMP and normal lipase Urinalysis unremarkable. CT abdomen and pelvis shows the following: IMPRESSION: 1. Sigmoid colitis, likely infectious/inflammatory. 2. Hepatic steatosis.  They were initially treated with IV fluids, morphine and started on Zosyn.   Patient was admitted to medicine service for further workup and management of sigmoid colitis as outlined in detail below.    Assessment & Plan  Principal Problem:   Acute colitis Active Problems:   Antibody deficiency syndrome (HCC)   History of asthma   Cardiac arrhythmia_nonsustained V. tach and atrial tachycardia   UTERINE CANCER, HX OF   History of Clostridium difficile infection   Rheumatoid arthritis (HCC)  Acute sigmoid colitis - likely stercoral History of radiation proctitis /Hx of sigmoid colitis / Hx of C. difficile colitis Had a BM last night. No diarrhea. No signs of obstruction on CT. Pain has improved overall.  - 9/24 - repeat CT abdomen/pelvis stable with mild sigmoid colitis - 9/25 -  curb-sided GI - feel this is stercoral colitis, recommend stool softeners despite loose stools, avoid recurrent constipation.  No further inpatient GI evaluation is indicated. - Recommend close outpatient GI follow up at [facility] - D/C IV morphine, IV anti-emetics - Monitor for symptom control x 24 hours on PO meds, possible d/c tomorrow - Completed 7 days course Zosyn  - Monitor off antibiotics (pt has hx of C diff, minimize abx exposure) - Monitor fever curve, CBC - On regular diet as tolerated - off IV fluids and PO hydration has been adequate - bowel regimen  - Metamucil for stool bulking - Avoid imodium & avoid recurrent constipation - negative for C diff and GI panel (9/20) - after d/w ID - sent stool lactoferrin, calproctectin for IBD evaluation - results can be addressed at GI follow-up   Antibody deficiency syndrome (HCC) Previously treated with IVIG until April 2024 when it was suspected to be the cause of aseptic meningitis that same month  Followed at [facility]  Hypokalemia- recurrent due to ongoing diarrhea. No replacement needed today. - continue to monitor and replete PRN   History of asthma Continue Symbicort DuoNebs as needed   Cardiac arrhythmia  nonsustained V. tach and atrial tachycardia- currently rate controlled and without home anti-arrhythmic medications.  Patient states she has baseline tachycardia to the 120s. Has been stable. Can dc tele.    Rheumatoid arthritis (HCC) Continue Flexeril and oxycodone   History of Clostridium difficile infection Having diarrhea and mixed stools related to acute colitis. 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 Complicates overall care and  prognosis.  Recommend lifestyle modifications including physical activity and diet for weight loss and overall long-term health.  VTE ppx: enoxaparin (LOVENOX) injection 40 mg Start: 09/13/23 0800  Diet:     Diet   Diet regular Fluid consistency: Thin   Consultants: None   Subjective 09/19/23    Pt seen this AM.  She continues to have frequent watery stools.  Discussed GI's review of her case and repeat CT scan findings reassuring - no worsening or complications, still mild colitis that GI feels could be stercoral.  They recommended outpatient follow up with her GI provider.  Pt agreeable, but wants to be sure her symptoms are  able to be controlled on PO meds before going home.  Hopeful to go home tomorrow.   Objective   Vitals:   09/18/23 1601 09/18/23 2018 09/19/23 0340 09/19/23 0734  BP: 112/67 102/71 118/72 (!) 113/58  Pulse: 79 85 79 80  Resp: 17 18 18 19   Temp: 98.2 F (36.8 C) 98.1 F (36.7 C) 98.8 F (37.1 C) 98.4 F (36.9 C)  TempSrc:   Oral   SpO2: 98% 97% 96% 97%  Weight:      Height:       No intake or output data in the 24 hours ending 09/19/23 1321    Filed Weights   09/10/23 2340  Weight: 83 kg     Physical Exam:   General exam: awake, alert, no acute distress HEENT: moist mucus membranes, hearing grossly normal  Respiratory system: on room air, normal respiratory effort. Lungs clear Cardiovascular system: RRR,  no pedal edema.   Gastrointestinal system: lower abdominal tenderness improved, no guarding or rebound, less distended, active bowel soudns Central nervous system: A&O x 3. no gross focal neurologic deficits, normal speech Extremities: moves all, no edema, normal tone Skin: dry, intact, normal temperature Psychiatry: normal mood, congruent affect, judgement and insight appear normal     Labs   I have personally reviewed the following labs and imaging studies CBC    Component Value Date/Time   WBC 7.2 09/15/2023 1031   RBC 4.01  09/15/2023 1031   HGB 11.9 (L) 09/15/2023 1031   HGB 11.5 04/02/2020 1545   HCT 37.1 09/15/2023 1031   HCT 35.2 04/02/2020 1545   PLT 286 09/15/2023 1031   PLT 279 04/02/2020 1545   MCV 92.5 09/15/2023 1031   MCV 96 04/02/2020 1545   MCH 29.7 09/15/2023 1031   MCHC 32.1 09/15/2023 1031   RDW 13.3 09/15/2023 1031   RDW 12.8 04/02/2020 1545   LYMPHSABS 2.5 08/25/2023 0954   LYMPHSABS 2.5 04/02/2020 1545   MONOABS 0.8 08/25/2023 0954   EOSABS 0.0 08/25/2023 0954   EOSABS 0.5 (H) 04/02/2020 1545   BASOSABS 0.0 08/25/2023 0954   BASOSABS 0.1 04/02/2020 1545      Latest Ref Rng & Units 09/19/2023    2:54 AM 09/18/2023    6:29 AM 09/17/2023    4:57 AM  BMP  Glucose 70 - 99 mg/dL 93  409  811   BUN 6 - 20 mg/dL 11  8  9    Creatinine 0.44 - 1.00 mg/dL 9.14  7.82  9.56   Sodium 135 - 145 mmol/L 137  138  139   Potassium 3.5 - 5.1 mmol/L 3.9  3.9  4.3   Chloride 98 - 111 mmol/L 106  108  110   CO2 22 - 32 mmol/L 23  24  23    Calcium 8.9 - 10.3 mg/dL 9.1  9.1  8.8     CT ABDOMEN PELVIS W CONTRAST  Result Date: 09/19/2023 CLINICAL DATA:  Left lower quadrant pain. EXAM: CT ABDOMEN AND PELVIS WITH CONTRAST TECHNIQUE: Multidetector CT imaging of the abdomen and pelvis was performed using the standard protocol following bolus administration of intravenous contrast. RADIATION DOSE REDUCTION: This exam was performed according to the departmental dose-optimization program which includes automated exposure control, adjustment of the mA and/or kV according to patient size and/or use of iterative reconstruction technique. CONTRAST:  OMNIPAQUE IOHEXOL 300 MG/ML  SOLN COMPARISON:  September 11, 2023 FINDINGS: Lower chest: No acute abnormality. Hepatobiliary: There is diffuse fatty infiltration of the liver parenchyma. No focal liver abnormality is seen. Status post cholecystectomy. No biliary dilatation. Pancreas: Unremarkable. No pancreatic ductal dilatation or surrounding inflammatory changes.  Spleen: Normal in size without focal abnormality. Adrenals/Urinary Tract: Adrenal glands are unremarkable. Kidneys are normal, without renal calculi, focal lesion, or hydronephrosis. Bladder is unremarkable. Stomach/Bowel: Stomach is within normal limits. The appendix is surgically absent. No evidence of bowel dilatation. The proximal sigmoid colon is mildly thickened and inflamed. This is stable in severity when compared to the prior exam. Vascular/Lymphatic: No significant vascular findings are present. No enlarged abdominal or pelvic lymph nodes. Surgical clips are seen along the bilateral inguinal vessels. Reproductive: Status post hysterectomy. No adnexal masses. Other: No abdominal wall hernia or abnormality. No abdominopelvic ascites. Musculoskeletal: No acute or significant osseous findings. IMPRESSION: 1. Mild sigmoid colitis. 2. Fatty liver. 3. Evidence of prior cholecystectomy, appendectomy and hysterectomy. Electronically Signed   By: Aram Candela M.D.   On: 09/19/2023 00:51     Disposition Plan & Communication  Patient status: Inpatient  Admitted From: Home Planned disposition location: Home Anticipated discharge date: 9/25 if symptoms controlled on PO medications over next 24 hours and continues to tolerate PO intake  Family Communication: None. Pt able to update.   Author: Pennie Banter, DO [facility] 09/19/2023, 1:21 PM   Available by Epic secure chat 7AM-7PM. If 7PM-7AM, please contact night-coverage.  [facility] contact information found on [email or web address].

## 2023-09-19 NOTE — Plan of Care (Signed)

## 2023-09-20 DIAGNOSIS — K529 Noninfective gastroenteritis and colitis, unspecified: Secondary | ICD-10-CM | POA: Diagnosis not present

## 2023-09-20 LAB — BASIC METABOLIC PANEL
Anion gap: 8 (ref 5–15)
BUN: 11 mg/dL (ref 6–20)
CO2: 24 mmol/L (ref 22–32)
Calcium: 9.3 mg/dL (ref 8.9–10.3)
Chloride: 105 mmol/L (ref 98–111)
Creatinine, Ser: 0.9 mg/dL (ref 0.44–1.00)
GFR, Estimated: >60 mL/min (ref >60)
Glucose, Bld: 106 mg/dL — ABNORMAL HIGH (ref 70–99)
Potassium: 4.2 mmol/L (ref 3.5–5.1)
Sodium: 137 mmol/L (ref 135–145)

## 2023-09-20 NOTE — Progress Notes (Signed)
Progress Note   Patient: [name] Horn ZOX:[id number] DOB: 1976-07-22 DOA: 09/11/2023     9 DOS: the patient was seen and examined on 09/20/2023   Subjective:  Patient seen and examined at bedside this morning Having diarrhea with associated nausea today She tells me she is improving but given persistence of her diarrhea she is not comfortable going home today   Brief hospital course: From HPI "[name] Horn is a 47 y.o. female with a PMH significant for Rheumatoid arthritis previously on methotrexate, migraines, atrial tachycardia, eosinophilic esophagitis, C. difficile colitis, antibody deficiency syndrome with multiple infections, previously on monthly IVIG until April 2024 when it was suspected to be the cause of aseptic meningitis that same month.   They presented from home to the ED on 09/11/2023 with abdominal pain, nausea, vomiting x 14 days. Denies hematemesis or hematochezia. Was seen outpatient and treated for UTI.    In the ED, it was found that they had Afebrile on arrival, BP 131/101. Tachycardic to 115 (patient states this is normal for her atrial tachycardia).  Significant findings included normal WBC and hemoglobin Potassium 2.9 with otherwise normal CMP and normal lipase Urinalysis unremarkable. CT abdomen and pelvis shows the following: IMPRESSION: 1. Sigmoid colitis, likely infectious/inflammatory. 2. Hepatic steatosis.   They were initially treated with IV fluids, morphine and started on Zosyn.    Patient was admitted to medicine service for further workup and management of sigmoid colitis as outlined in detail below.  "      Assessment and Plan: Acute sigmoid colitis - likely stercoral History of radiation proctitis /Hx of sigmoid colitis / Hx of C. difficile colitis Abdominal pain continues to improve - 9/24 - repeat CT abdomen/pelvis stable with mild sigmoid colitis -Previous attending spoke with GI - feel this is stercoral colitis, recommend stool  softeners despite loose stools, avoid recurrent constipation.  No further inpatient GI evaluation is indicated. - Recommend close outpatient GI follow up at [facility] - D/C IV morphine, IV anti-emetics - Monitor for symptom control x 24 hours on PO meds, possible d/c tomorrow - Completed 7 days course Zosyn  - Monitor off antibiotics (pt has hx of C diff, minimize abx exposure) - Monitor fever curve, CBC - On regular diet as tolerated - off IV fluids and PO hydration has been adequate - bowel regimen  - Metamucil for stool bulking - Avoid imodium & avoid recurrent constipation - negative for C diff and GI panel (9/20) - after d/w ID -Follow-up stool lactoferrin, calproctectin for IBD evaluation - results can be addressed at GI follow-up   Antibody deficiency syndrome (HCC) Previously treated with IVIG until April 2024 when it was suspected to be the cause of aseptic meningitis that same month  Outpatient follow-up   Hypokalemia- recurrent due to ongoing diarrhea. No replacement needed today. - continue to monitor and replete PRN   History of asthma Continue Symbicort and as needed DuoNebs    Cardiac arrhythmia  nonsustained V. tach and atrial tachycardia- currently rate controlled and without home anti-arrhythmic medications.  Patient states she has baseline tachycardia to the 120s. Has been stable. Can dc tele.    Rheumatoid arthritis (HCC) Continue Flexeril and oxycodone   History of Clostridium difficile infection Having diarrhea and mixed stools related to acute colitis. 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 Complicates overall care and prognosis.  Recommend lifestyle modifications  including physical activity and diet for weight loss and overall long-term health.   VTE ppx: enoxaparin (LOVENOX) injection 40 mg Start: 09/13/23 0800   Physical Exam: General exam: awake, alert, no acute distress HEENT: moist mucus membranes, hearing grossly normal  Respiratory system: on room air, normal respiratory effort. Lungs clear Cardiovascular system: RRR,  no pedal edema.   Gastrointestinal system: abdomen less distended, ongoing mild tenderness without rebound or guarding, hyperactive bowel sounds Central nervous system: A&O x 3. no gross focal neurologic deficits, normal speech Extremities: moves all, no edema, normal tone Skin: dry, intact, normal temperature Psychiatry: normal mood, congruent affect, judgement and insight appear normal   Vitals:   09/19/23 2019 09/20/23 0500 09/20/23 0831 09/20/23 1640  BP: 109/75 112/72 114/74 112/74  Pulse: 95 90 84 85  Resp:   16   Temp: 97.8 F (36.6 C) 98.7 F (37.1 C) 98.2 F (36.8 C) 98.1 F (36.7 C)  TempSrc:  Oral    SpO2: 97% 98% 99% 97%  Weight:      Height:        Data Reviewed: I have reviewed patient's labs as well as vitals as shown     Latest Ref Rng & Units 09/15/2023   10:31 AM 09/12/2023    5:32 AM 09/11/2023    4:11 AM  CBC  WBC 4.0 - 10.5 K/uL 7.2  7.1  11.0   Hemoglobin 12.0 - 15.0 g/dL 65.7  84.6  96.2   Hematocrit 36.0 - 46.0 % 37.1  36.6  40.3   Platelets 150 - 400 K/uL 286  296  361        Latest Ref Rng & Units 09/20/2023    7:12 AM 09/19/2023    2:54 AM 09/18/2023    6:29 AM  BMP  Glucose 70 - 99 mg/dL 952  93  841   BUN 6 - 20 mg/dL 11  11  8    Creatinine 0.44 - 1.00 mg/dL 3.24  4.01  0.27   Sodium 135 - 145 mmol/L 137  137  138   Potassium 3.5 - 5.1 mmol/L 4.2  3.9  3.9   Chloride 98 - 111 mmol/L 105  106  108   CO2 22 - 32 mmol/L 24  23  24    Calcium 8.9 - 10.3 mg/dL 9.3  9.1  9.1     Author: Loyce Dys, MD 09/20/2023 4:51 PM  For on call review [email or web address].

## 2023-09-20 NOTE — Plan of Care (Signed)

## 2023-09-21 DIAGNOSIS — K529 Noninfective gastroenteritis and colitis, unspecified: Secondary | ICD-10-CM | POA: Diagnosis not present

## 2023-09-21 LAB — CALPROTECTIN, FECAL: Calprotectin, Fecal: 74 ug/g (ref 0–120)

## 2023-09-21 MED ORDER — PANTOPRAZOLE SODIUM 40 MG PO TBEC: 40.0000 mg | DELAYED_RELEASE_TABLET | Freq: Every day | ORAL | Status: AC

## 2023-09-21 MED ORDER — ONDANSETRON 4 MG PO TBDP: 4.0000 mg | ORAL_TABLET | Freq: Three times a day (TID) | ORAL | 0 refills | Status: DC | PRN

## 2023-09-21 MED ORDER — PROMETHAZINE HCL 25 MG PO TABS: 25.0000 mg | ORAL_TABLET | Freq: Four times a day (QID) | ORAL | 0 refills | Status: AC | PRN

## 2023-09-21 MED ORDER — HYDROXYZINE HCL 25 MG PO TABS: 25.0000 mg | ORAL_TABLET | Freq: Two times a day (BID) | ORAL | 0 refills | Status: DC

## 2023-09-21 MED ORDER — LACTULOSE 10 GM/15ML PO SOLN: 20.0000 g | Freq: Every day | ORAL | 0 refills | Status: DC | PRN

## 2023-09-21 MED ORDER — POLYETHYLENE GLYCOL 3350 17 G PO PACK: 17.0000 g | PACK | Freq: Every day | ORAL | 0 refills | Status: DC

## 2023-09-21 MED ORDER — PSYLLIUM 95 % PO PACK: 1.0000 | PACK | Freq: Every day | ORAL | Status: DC

## 2023-09-21 MED ORDER — ONDANSETRON 8 MG PO TBDP: 8.0000 mg | ORAL_TABLET | Freq: Three times a day (TID) | ORAL | 0 refills | Status: DC | PRN

## 2023-09-21 NOTE — Discharge Summary (Signed)
Physician Discharge Summary   Patient: [name] Horn MRN: [id number] DOB: 1976/04/12  Admit date:     09/11/2023  Discharge date: 09/21/23  Discharge Physician: Loyce Dys   PCP: [facility]   Recommendations at discharge:  Outpatient follow-up with your primary care physician  Discharge Diagnoses: Acute sigmoid colitis - likely stercoral History of radiation proctitis /Hx of sigmoid colitis / Hx of C. difficile colitis Antibody deficiency syndrome (HCC) Hypokalemia History of asthma Cardiac arrhythmia  nonsustained V. tach and atrial tachycardia Rheumatoid arthritis (HCC) History of Clostridium difficile infection UTERINE CANCER, HX OF Depression  anxiety -  Obesity  Hospital Course: [name] Horn is a 47 y.o. female with a PMH significant for Rheumatoid arthritis previously on methotrexate, migraines, atrial tachycardia, eosinophilic esophagitis, C. difficile colitis, antibody deficiency syndrome with multiple infections, previously on monthly IVIG until April 2024 when it was suspected to be the cause of aseptic meningitis that same month.   They presented from home to the ED on 09/11/2023 with abdominal pain, nausea, vomiting x 14 days. Denies hematemesis or hematochezia. Was seen outpatient and treated for UTI.    In the ED, it was found that they had Afebrile on arrival, BP 131/101. Tachycardic to 115 (patient states this is normal for her atrial tachycardia).  Significant findings included normal WBC and hemoglobin Potassium 2.9 with otherwise normal CMP and normal lipase Urinalysis unremarkable. CT abdomen and pelvis shows the following: IMPRESSION: 1. Sigmoid colitis, likely infectious/inflammatory. 2. Hepatic steatosis.   They were initially treated with IV fluids, morphine and started on Zosyn.    Patient was admitted to medicine service for further workup and management of sigmoid colitis.  Abdominal pain is improved and diarrhea improved.   Case was discussed with gastroenterologist and this was thought to be likely stercoral.  We recommended discontinuation of opioids and no additional antibiotic therapy upon discharge.  Patient to follow-up with GI as well as her primary care physicians.   Consultants: GI Procedures performed: None Disposition: Home Diet recommendation:  Cardiac diet     DISCHARGE MEDICATION: Allergies as of 09/21/2023       Reactions   Cyanoacrylate Hives   Other Itching, Rash   Dermabond surgical glue. Dermabond surgical glue-blisters   Sulfa Antibiotics Anaphylaxis, Rash, Shortness Of Breath, Swelling   Angioedema (also)   Sulfonamide Derivatives Hives, Shortness Of Breath, Swelling   TONGUE SWELLS   Benadryl [diphenhydramine Hcl] Other (See Comments)   Hyperactivity   Sulfamethoxazole    Other Reaction(s): Angioedema   Diphenhydramine    Diphenhydramine Hcl    Other Reaction(s): Other (See Comments) HYPERACTIVITY, RESTLESS LEG   Silicone Rash   Watch band        Medication List     STOP taking these medications    atropine 1 % ophthalmic solution   baclofen 10 MG tablet Commonly known as: LIORESAL   Bepotastine Besilate 1.5 % Soln   cephALEXin 500 MG capsule Commonly known as: KEFLEX   cyclobenzaprine 10 MG tablet Commonly known as: FLEXERIL   LORazepam 1 MG tablet Commonly known as: ATIVAN   metoCLOPramide 10 MG tablet Commonly known as: REGLAN   oxyCODONE-acetaminophen 10-325 MG tablet Commonly known as: PERCOCET   SUMAtriptan 25 MG tablet Commonly known as: IMITREX       TAKE these medications    albuterol (2.5 MG/3ML) 0.083% nebulizer solution Commonly known as: PROVENTIL Take 3 mLs (2.5 mg total) by nebulization every 4 (four) hours as needed for wheezing  or shortness of breath.   albuterol 108 (90 Base) MCG/ACT inhaler Commonly known as: VENTOLIN HFA Inhale 1 puff into the lungs every 4 (four) hours as needed.   amphetamine-dextroamphetamine 20  MG tablet Commonly known as: ADDERALL Take 20 mg by mouth 3 (three) times daily.   budesonide-formoterol 80-4.5 MCG/ACT inhaler Commonly known as: Symbicort Inhale 2 puffs into the lungs 2 (two) times daily. Can also take Q4 hr as needed for wheezing   cycloSPORINE 0.05 % ophthalmic emulsion Commonly known as: RESTASIS Place 1 drop into both eyes 2 (two) times daily.   dicyclomine 10 MG capsule Commonly known as: BENTYL Take 10 mg by mouth daily as needed for nausea/vomiting.   EpiPen 2-Pak 0.3 mg/0.3 mL Soaj injection Generic drug: EPINEPHrine Inject 0.3 mg into the muscle as needed for anaphylaxis.   famotidine 40 MG tablet Commonly known as: PEPCID Take 40 mg by mouth at bedtime.   folic acid 1 MG tablet Commonly known as: FOLVITE Take 2 tablets (2 mg total) by mouth daily.   furosemide 20 MG tablet Commonly known as: LASIX Take 60 mg by mouth 2 (two) times daily as needed for fluid or edema.   hydrOXYzine 25 MG tablet Commonly known as: ATARAX Take 1 tablet (25 mg total) by mouth 2 (two) times daily. What changed:  medication strength how much to take   lactulose 10 GM/15ML solution Commonly known as: CHRONULAC Take 30 mLs (20 g total) by mouth daily as needed for mild constipation.   naloxone 4 MG/0.1ML Liqd nasal spray kit Commonly known as: NARCAN Place 1 spray into the nose once.   nystatin 100000 UNIT/ML suspension Commonly known as: MYCOSTATIN Take 10 mLs (1,000,000 Units total) by mouth 3 (three) times daily.   nystatin powder Apply 1 application topically 2 (two) times daily as needed (rash).   ondansetron 8 MG disintegrating tablet Commonly known as: ZOFRAN-ODT Take 1 tablet (8 mg total) by mouth every 8 (eight) hours as needed. What changed:  medication strength how much to take reasons to take this   pantoprazole 40 MG tablet Commonly known as: Protonix Take 1 tablet (40 mg total) by mouth daily. What changed: when to take this    Polyethyl Glycol-Propyl Glycol 0.4-0.3 % Gel ophthalmic gel Commonly known as: SYSTANE Place 1 application into both eyes in the morning and at bedtime.   polyethylene glycol 17 g packet Commonly known as: MIRALAX / GLYCOLAX Take 17 g by mouth daily.   Potassium Chloride ER 20 MEQ Tbcr Take 20 mEq by mouth daily as needed (when taking furosemide).   pravastatin 40 MG tablet Commonly known as: PRAVACHOL Take 40 mg by mouth at bedtime.   prednisoLONE acetate 1 % ophthalmic suspension Commonly known as: PRED FORTE Place 1 drop into the left eye as needed (conjunctivitis).   prochlorperazine 10 MG tablet Commonly known as: COMPAZINE Take 1 tablet (10 mg total) by mouth every 6 (six) hours as needed for nausea or vomiting.   promethazine 25 MG tablet Commonly known as: PHENERGAN Take 1 tablet (25 mg total) by mouth every 6 (six) hours as needed for nausea or vomiting.   psyllium 95 % Pack Commonly known as: HYDROCIL/METAMUCIL Take 1 packet by mouth daily.   rizatriptan 10 MG disintegrating tablet Commonly known as: MAXALT-MLT Take 10 mg by mouth as directed.   sertraline 25 MG tablet Commonly known as: ZOLOFT Take 25 mg by mouth daily.   tiZANidine 2 MG tablet Commonly known as: ZANAFLEX Take  2 mg by mouth 3 (three) times daily.   topiramate 25 MG tablet Commonly known as: TOPAMAX Take 25 mg by mouth at bedtime.   topiramate 100 MG tablet Commonly known as: TOPAMAX Take 100 mg by mouth daily.   triamcinolone lotion 0.1 % Commonly known as: KENALOG Apply 1 application topically daily as needed (rash).   valACYclovir 1000 MG tablet Commonly known as: VALTREX Take 1 tablet (1,000 mg total) by mouth daily.        Follow-up Information     Terri Piedra, PA-C. Go on 09/29/2023.   Specialty: Physician Assistant Why: @ 3:30pm Contact information: [address] [place] [address] [phone]         Milagros Evener, MD. Go on 10/05/2023.    Specialty: Psychiatry Why: @ [facility] information: [address] [place] [address] [place] [address] [phone]                Discharge Exam: Ceasar Mons Weights   09/10/23 2340  Weight: 83 kg   General exam: awake, alert, no acute distress HEENT: moist mucus membranes, hearing grossly normal  Respiratory system: on room air, normal respiratory effort. Lungs clear Cardiovascular system: RRR,  no pedal edema.   Gastrointestinal system: abdomen less distended, nontender Central nervous system: A&O x 3. no gross focal neurologic deficits, normal speech Extremities: moves all, no edema, normal tone Skin: dry, intact, normal temperature Psychiatry: normal mood, congruent affect, judgement and insight appear normal    Condition at discharge: good  The results of significant diagnostics from this hospitalization (including imaging, microbiology, ancillary and laboratory) are listed below for reference.   Imaging Studies: CT ABDOMEN PELVIS W CONTRAST  Result Date: 09/19/2023 CLINICAL DATA:  Left lower quadrant pain. EXAM: CT ABDOMEN AND PELVIS WITH CONTRAST TECHNIQUE: Multidetector CT imaging of the abdomen and pelvis was performed using the standard protocol following bolus administration of intravenous contrast. RADIATION DOSE REDUCTION: This exam was performed according to the departmental dose-optimization program which includes automated exposure control, adjustment of the mA and/or kV according to patient size and/or use of iterative reconstruction technique. CONTRAST:  OMNIPAQUE IOHEXOL 300 MG/ML  SOLN COMPARISON:  September 11, 2023 FINDINGS: Lower chest: No acute abnormality. Hepatobiliary: There is diffuse fatty infiltration of the liver parenchyma. No focal liver abnormality is seen. Status post cholecystectomy. No biliary dilatation. Pancreas: Unremarkable. No pancreatic ductal dilatation or surrounding inflammatory changes. Spleen: Normal in size without focal  abnormality. Adrenals/Urinary Tract: Adrenal glands are unremarkable. Kidneys are normal, without renal calculi, focal lesion, or hydronephrosis. Bladder is unremarkable. Stomach/Bowel: Stomach is within normal limits. The appendix is surgically absent. No evidence of bowel dilatation. The proximal sigmoid colon is mildly thickened and inflamed. This is stable in severity when compared to the prior exam. Vascular/Lymphatic: No significant vascular findings are present. No enlarged abdominal or pelvic lymph nodes. Surgical clips are seen along the bilateral inguinal vessels. Reproductive: Status post hysterectomy. No adnexal masses. Other: No abdominal wall hernia or abnormality. No abdominopelvic ascites. Musculoskeletal: No acute or significant osseous findings. IMPRESSION: 1. Mild sigmoid colitis. 2. Fatty liver. 3. Evidence of prior cholecystectomy, appendectomy and hysterectomy. Electronically Signed   By: Aram Candela M.D.   On: 09/19/2023 00:51   CT ABDOMEN PELVIS W CONTRAST  Result Date: 09/11/2023 CLINICAL DATA:  Abdominal pain and emesis.  On antibiotics for UTI. EXAM: CT ABDOMEN AND PELVIS WITH CONTRAST TECHNIQUE: Multidetector CT imaging of the abdomen and pelvis was performed using the standard protocol  following bolus administration of intravenous contrast. RADIATION DOSE REDUCTION: This exam was performed according to the departmental dose-optimization program which includes automated exposure control, adjustment of the mA and/or kV according to patient size and/or use of iterative reconstruction technique. CONTRAST:  OMNIPAQUE IOHEXOL 300 MG/ML  SOLN COMPARISON:  Same day abdominal radiograph and CT abdomen and pelvis 09/25/2022 FINDINGS: Lower chest: No acute abnormality. Hepatobiliary: Hepatic steatosis. Cholecystectomy. No biliary dilation. Pancreas: Unremarkable. Spleen: Unremarkable. Adrenals/Urinary Tract: Normal adrenal glands. No urinary calculi or hydronephrosis. Bladder is  unremarkable. Stomach/Bowel: Moderate colonic stool burden. No evidence of obstruction. Wall thickening and pericolonic stranding about the sigmoid colon compatible with colitis. Small hiatal hernia. Appendix is not visualized. Vascular/Lymphatic: No significant vascular findings are present. No enlarged abdominal or pelvic lymph nodes. Reproductive: Hysterectomy. Other: Small volume free fluid in the pelvis. No free intraperitoneal air. No abscess. Musculoskeletal: No acute fracture. IMPRESSION: 1. Sigmoid colitis, likely infectious/inflammatory. 2. Hepatic steatosis. Electronically Signed   By: Minerva Fester M.D.   On: 09/11/2023 02:47   DG Abdomen 1 View  Result Date: 09/11/2023 CLINICAL DATA:  Constipation EXAM: ABDOMEN - 1 VIEW COMPARISON:  None Available. FINDINGS: Nonobstructive bowel gas pattern. Moderate colonic stool burden, suggesting constipation. Cholecystectomy clips. IMPRESSION: Moderate colonic stool burden, suggesting constipation. Electronically Signed   By: Charline Bills M.D.   On: 09/11/2023 00:42   DG Chest Portable 1 View  Result Date: 08/25/2023 CLINICAL DATA:  Shortness of breath. EXAM: PORTABLE CHEST 1 VIEW COMPARISON:  06/15/2023. FINDINGS: Bilateral lung fields are clear. Bilateral costophrenic angles are clear. Normal cardio-mediastinal silhouette. No acute osseous abnormalities. The soft tissues are within normal limits. Redemonstration of presumed loop recorder device overlying the left middle chest wall. IMPRESSION: No active disease. Electronically Signed   By: Jules Schick M.D.   On: 08/25/2023 16:05   CT HEAD WO CONTRAST ( )  Result Date: 08/25/2023 CLINICAL DATA:  Migraines EXAM: CT HEAD WITHOUT CONTRAST TECHNIQUE: Contiguous axial images were obtained from the base of the skull through the vertex without intravenous contrast. RADIATION DOSE REDUCTION: This exam was performed according to the departmental dose-optimization program which includes automated  exposure control, adjustment of the mA and/or kV according to patient size and/or use of iterative reconstruction technique. COMPARISON:  Brain MRI 05/03/2023, CT head 06/06/2023 FINDINGS: Brain: There is no acute intracranial hemorrhage, extra-axial fluid collection, or acute infarct Parenchymal volume is normal. The ventricles are normal in size. Gray-white differentiation is preserved. A prominent perivascular space in the left parietal white matter is unchanged. The pituitary and suprasellar region are normal. There is no mass lesion. There is no mass effect or midline shift. Vascular: No hyperdense vessel or unexpected calcification. Skull: Normal. Negative for fracture or focal lesion. Sinuses/Orbits: The imaged paranasal sinuses are clear. The globes and orbits are unremarkable. Other: The mastoid air cells and middle ear cavities are clear. IMPRESSION: No acute intracranial pathology. Electronically Signed   By: Lesia Hausen M.D.   On: 08/25/2023 13:01    Microbiology: Results for orders placed or performed during the hospital encounter of 09/11/23  Gastrointestinal Panel by PCR , Stool     Status: None   Collection Time: 09/15/23 11:30 AM   Specimen: Stool  Result Value Ref Range Status   Campylobacter species NOT DETECTED NOT DETECTED Final   Plesimonas shigelloides NOT DETECTED NOT DETECTED Final   Salmonella species NOT DETECTED NOT DETECTED Final   Yersinia enterocolitica NOT DETECTED NOT DETECTED Final   Vibrio species NOT  DETECTED NOT DETECTED Final   Vibrio cholerae NOT DETECTED NOT DETECTED Final   Enteroaggregative E coli (EAEC) NOT DETECTED NOT DETECTED Final   Enteropathogenic E coli (EPEC) NOT DETECTED NOT DETECTED Final   Enterotoxigenic E coli (ETEC) NOT DETECTED NOT DETECTED Final   Shiga like toxin producing E coli (STEC) NOT DETECTED NOT DETECTED Final   Shigella/Enteroinvasive E coli (EIEC) NOT DETECTED NOT DETECTED Final   Cryptosporidium NOT DETECTED NOT DETECTED Final    Cyclospora cayetanensis NOT DETECTED NOT DETECTED Final   Entamoeba histolytica NOT DETECTED NOT DETECTED Final   Giardia lamblia NOT DETECTED NOT DETECTED Final   Adenovirus F40/41 NOT DETECTED NOT DETECTED Final   Astrovirus NOT DETECTED NOT DETECTED Final   Norovirus GI/GII NOT DETECTED NOT DETECTED Final   Rotavirus A NOT DETECTED NOT DETECTED Final   Sapovirus (I, II, IV, and V) NOT DETECTED NOT DETECTED Final    Comment: Performed at [facility], [address] [place], [place] [address]  C Difficile Quick Screen (NO PCR Reflex)     Status: None   Collection Time: 09/15/23 11:30 AM   Specimen: Stool  Result Value Ref Range Status   C Diff antigen NEGATIVE NEGATIVE Final   C Diff toxin NEGATIVE NEGATIVE Final   C Diff interpretation No C. difficile detected.  Final    Comment: Performed at [facility], [address] [place], [place] [address]  Calprotectin, Fecal     Status: None   Collection Time: 09/19/23  9:50 AM   Specimen: Stool  Result Value Ref Range Status   Calprotectin, Fecal 74 0 - 120 ug/g Final    Comment: (NOTE) Concentration     Interpretation   Follow-Up < 5 - 50 ug/g     Normal           None >50 -120 ug/g     Borderline       Re-evaluate in 4-6 weeks    >120 ug/g     Abnormal         Repeat as clinically                                   indicated Performed At: [facility] [address] [place], [place] [id number] Jolene Schimke MD QM:[id number]     Labs: CBC: Recent Labs  Lab 09/15/23 1031  WBC 7.2  HGB 11.9*  HCT 37.1  MCV 92.5  PLT 286   Basic Metabolic Panel: Recent Labs  Lab 09/16/23 0438 09/17/23 0457 09/18/23 0629 09/19/23 0254 09/20/23 0712  NA 138 139 138 137 137  K 3.2* 4.3 3.9 3.9 4.2  CL 110 110 108 106 105  CO2 22 23 24 23 24   GLUCOSE 105* 103* 110* 93 106*  BUN 9 9 8 11 11   CREATININE 0.86 1.00 0.83 0.92 0.90  CALCIUM 9.1 8.8* 9.1 9.1 9.3  MG 1.8  --   --  2.0  --    Liver  Function Tests: No results for input(s): "AST", "ALT", "ALKPHOS", "BILITOT", "PROT", "ALBUMIN" in the last 168 hours. CBG: No results for input(s): "GLUCAP" in the last 168 hours.  Discharge time spent:  39 minutes.  Signed: Loyce Dys, MD [facility] 09/21/2023

## 2023-09-21 NOTE — Plan of Care (Signed)

## 2023-09-22 LAB — OVA + PARASITE EXAM

## 2023-09-22 LAB — O&P RESULT

## 2023-09-27 ENCOUNTER — Telehealth: Payer: Self-pay

## 2023-10-20 ENCOUNTER — Other Ambulatory Visit: Payer: Self-pay | Admitting: Pharmacy Technician

## 2023-10-20 ENCOUNTER — Other Ambulatory Visit: Payer: Self-pay

## 2023-10-20 ENCOUNTER — Telehealth: Payer: Self-pay | Admitting: Pharmacy Technician

## 2023-10-20 DIAGNOSIS — D806 Antibody deficiency with near-normal immunoglobulins or with hyperimmunoglobulinemia: Secondary | ICD-10-CM

## 2023-10-20 MED ORDER — PRIVIGEN 10 GM/100ML IV SOLN
40.0000 g | INTRAVENOUS | 12 refills | Status: DC
Start: 2023-10-20 — End: 2023-11-02
  Filled 2023-10-20 – 2023-11-02 (×3): qty 400, 28d supply, fill #0

## 2023-10-20 NOTE — Telephone Encounter (Addendum)
Patient will be scheduled as soon as possible.  Auth Submission: NO AUTH NEEDED Site of care: Site of care: [facility] Payer: [employer] medicaid Medication & CPT/J Code(s) submitted: [employer] (IVIG) J1459 Route of submission (phone, fax, portal): portal - Cabin crew # Fax # Auth type: pharmacy - [facility] Units/visits requested: 40gm [id number] Reference [id number] Approval from 10/20/23 - 12/26/23

## 2023-10-23 ENCOUNTER — Other Ambulatory Visit (HOSPITAL_COMMUNITY): Payer: Self-pay

## 2023-10-23 ENCOUNTER — Other Ambulatory Visit: Payer: Self-pay

## 2023-10-23 ENCOUNTER — Encounter (HOSPITAL_COMMUNITY): Payer: Self-pay

## 2023-10-23 ENCOUNTER — Encounter: Payer: Self-pay | Admitting: Family Medicine

## 2023-10-25 ENCOUNTER — Other Ambulatory Visit: Payer: Self-pay

## 2023-10-25 ENCOUNTER — Other Ambulatory Visit (HOSPITAL_COMMUNITY): Payer: Self-pay

## 2023-10-25 NOTE — Progress Notes (Addendum)
Specialty Pharmacy Initial Fill Coordination Note  [name] Horn is a 47 y.o. female contacted today regarding refills of specialty medication(s) Lactulose (Laxatives - Miscellaneous)   Patient requested Courier to Provider Office   Delivery date: 10/31/23   Verified address: [facility]-[address], [address], [place], [place] [address]   Medication will be filled on 11/4.   Patient is aware of $4 copayment.

## 2023-10-30 ENCOUNTER — Other Ambulatory Visit: Payer: Self-pay

## 2023-11-02 ENCOUNTER — Other Ambulatory Visit (HOSPITAL_COMMUNITY): Payer: Self-pay

## 2023-11-02 ENCOUNTER — Other Ambulatory Visit: Payer: Self-pay

## 2023-11-02 MED ORDER — PRIVIGEN 40 GM/400ML IV SOLN
40.0000 g | INTRAVENOUS | 12 refills | Status: AC
  Filled 2023-11-02: qty 400, 28d supply, fill #0
  Filled 2023-11-24: qty 400, 28d supply, fill #1

## 2023-11-03 ENCOUNTER — Ambulatory Visit (INDEPENDENT_AMBULATORY_CARE_PROVIDER_SITE_OTHER): Payer: Medicaid Other

## 2023-11-03 ENCOUNTER — Other Ambulatory Visit: Payer: Self-pay

## 2023-11-03 VITALS — BP 107/61 | HR 101 | Temp 99.1°F | Resp 20 | Ht 64.0 in | Wt 186.4 lb

## 2023-11-03 DIAGNOSIS — D806 Antibody deficiency with near-normal immunoglobulins or with hyperimmunoglobulinemia: Secondary | ICD-10-CM | POA: Diagnosis not present

## 2023-11-03 MED ORDER — ACETAMINOPHEN 325 MG PO TABS
650.0000 mg | ORAL_TABLET | Freq: Once | ORAL | Status: AC
Start: 2023-11-03 — End: 2023-11-03
  Administered 2023-11-03: 650 mg via ORAL
  Filled 2023-11-03: qty 2

## 2023-11-03 MED ORDER — IMMUNE GLOBULIN (HUMAN) 5 GM/50ML IV SOLN
40.0000 g | Freq: Once | INTRAVENOUS | Status: AC
  Administered 2023-11-03: 40 g via INTRAVENOUS

## 2023-11-03 MED ORDER — DIPHENHYDRAMINE HCL 25 MG PO CAPS
25.0000 mg | ORAL_CAPSULE | Freq: Once | ORAL | Status: AC
Start: 2023-11-03 — End: 2023-11-03
  Administered 2023-11-03: 25 mg via ORAL
  Filled 2023-11-03: qty 1

## 2023-11-03 MED ORDER — DEXTROSE 5 % IV SOLN
INTRAVENOUS | Status: DC
Start: 2023-11-03 — End: 2023-11-03

## 2023-11-03 MED ORDER — ACETAMINOPHEN 500 MG PO TABS: 500.0000 mg | ORAL_TABLET | Freq: Once | ORAL | Status: AC

## 2023-11-03 NOTE — Progress Notes (Signed)
Diagnosis: Specific antibody deficiency normal Immunoglobulin concentration  Provider:  Chilton Greathouse MD  Procedure: IV Infusion  IV Type: Peripheral, IV Location: L Antecubital  IVIG (Immune Globulin), Dose: 40 g  Infusion Start Time: 0928  Infusion Stop Time: 1233  Post Infusion IV Care: Peripheral IV Discontinued  Discharge: Condition: Good, Destination: Home . AVS Declined  Performed by:  Nat Math, RN

## 2023-11-14 ENCOUNTER — Other Ambulatory Visit: Payer: Self-pay

## 2023-11-21 ENCOUNTER — Other Ambulatory Visit (HOSPITAL_COMMUNITY): Payer: Self-pay

## 2023-11-24 ENCOUNTER — Other Ambulatory Visit: Payer: Self-pay

## 2023-11-24 NOTE — Progress Notes (Signed)
Specialty Pharmacy Refill Coordination Note  [name] Horn is a 47 y.o. female contacted today regarding refills of specialty medication(s) Immune Globulin (Human) (Immune Serums)   Patient requested Delivery   Delivery date: 12/06/23   Verified address: [facility] on [address]   Medication will be filled on 12/01/23.

## 2023-11-27 ENCOUNTER — Other Ambulatory Visit: Payer: Self-pay

## 2023-12-01 ENCOUNTER — Ambulatory Visit: Payer: Medicaid Other

## 2023-12-08 ENCOUNTER — Ambulatory Visit (INDEPENDENT_AMBULATORY_CARE_PROVIDER_SITE_OTHER): Payer: Medicaid Other

## 2023-12-08 VITALS — BP 115/78 | HR 96 | Temp 98.3°F | Resp 18 | Ht 64.0 in | Wt 187.0 lb

## 2023-12-08 DIAGNOSIS — D806 Antibody deficiency with near-normal immunoglobulins or with hyperimmunoglobulinemia: Secondary | ICD-10-CM | POA: Diagnosis not present

## 2023-12-08 MED ORDER — IMMUNE GLOBULIN (HUMAN) 5 GM/50ML IV SOLN
40.0000 g | Freq: Once | INTRAVENOUS | Status: AC
Start: 2023-12-08 — End: 2023-12-08
  Administered 2023-12-08: 40 g via INTRAVENOUS
  Filled 2023-12-08: qty 400

## 2023-12-08 MED ORDER — DIPHENHYDRAMINE HCL 25 MG PO CAPS
25.0000 mg | ORAL_CAPSULE | Freq: Once | ORAL | Status: AC
  Administered 2023-12-08: 25 mg via ORAL
  Filled 2023-12-08: qty 1

## 2023-12-08 MED ORDER — ACETAMINOPHEN 325 MG PO TABS
650.0000 mg | ORAL_TABLET | Freq: Once | ORAL | Status: AC
  Administered 2023-12-08: 650 mg via ORAL
  Filled 2023-12-08: qty 2

## 2023-12-08 MED ORDER — DEXTROSE 5 % IV SOLN
INTRAVENOUS | Status: DC
Start: 2023-12-08 — End: 2023-12-08

## 2023-12-08 NOTE — Progress Notes (Signed)
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 AVS Provided  Performed by:  Loney Hering, LPN

## 2023-12-10 ENCOUNTER — Inpatient Hospital Stay
Admission: EM | Admit: 2023-12-10 | Discharge: 2023-12-15 | DRG: 871 | Disposition: A | Discharge status: Home / Self Care | Payer: Medicaid Other | Attending: Internal Medicine | Admitting: Internal Medicine

## 2023-12-10 ENCOUNTER — Other Ambulatory Visit: Payer: Self-pay

## 2023-12-10 ENCOUNTER — Emergency Department: Payer: Medicaid Other

## 2023-12-10 DIAGNOSIS — G009 Bacterial meningitis, unspecified: Secondary | ICD-10-CM | POA: Diagnosis present

## 2023-12-10 DIAGNOSIS — B0229 Other postherpetic nervous system involvement: Secondary | ICD-10-CM | POA: Diagnosis present

## 2023-12-10 DIAGNOSIS — Z800 Family history of malignant neoplasm of digestive organs: Secondary | ICD-10-CM

## 2023-12-10 DIAGNOSIS — Z7951 Long term (current) use of inhaled steroids: Secondary | ICD-10-CM

## 2023-12-10 DIAGNOSIS — B370 Candidal stomatitis: Secondary | ICD-10-CM | POA: Diagnosis not present

## 2023-12-10 DIAGNOSIS — D809 Immunodeficiency with predominantly antibody defects, unspecified: Secondary | ICD-10-CM | POA: Diagnosis present

## 2023-12-10 DIAGNOSIS — G030 Nonpyogenic meningitis: Secondary | ICD-10-CM | POA: Diagnosis present

## 2023-12-10 DIAGNOSIS — I959 Hypotension, unspecified: Secondary | ICD-10-CM | POA: Diagnosis present

## 2023-12-10 DIAGNOSIS — Z8619 Personal history of other infectious and parasitic diseases: Secondary | ICD-10-CM

## 2023-12-10 DIAGNOSIS — I890 Lymphedema, not elsewhere classified: Secondary | ICD-10-CM | POA: Diagnosis present

## 2023-12-10 DIAGNOSIS — D803 Selective deficiency of immunoglobulin G [IgG] subclasses: Secondary | ICD-10-CM | POA: Diagnosis present

## 2023-12-10 DIAGNOSIS — M059 Rheumatoid arthritis with rheumatoid factor, unspecified: Secondary | ICD-10-CM | POA: Diagnosis present

## 2023-12-10 DIAGNOSIS — I872 Venous insufficiency (chronic) (peripheral): Secondary | ICD-10-CM | POA: Diagnosis present

## 2023-12-10 DIAGNOSIS — F32A Depression, unspecified: Secondary | ICD-10-CM | POA: Insufficient documentation

## 2023-12-10 DIAGNOSIS — K3184 Gastroparesis: Secondary | ICD-10-CM | POA: Diagnosis present

## 2023-12-10 DIAGNOSIS — T50Z15A Adverse effect of immunoglobulin, initial encounter: Secondary | ICD-10-CM | POA: Diagnosis present

## 2023-12-10 DIAGNOSIS — G43009 Migraine without aura, not intractable, without status migrainosus: Secondary | ICD-10-CM | POA: Diagnosis not present

## 2023-12-10 DIAGNOSIS — R197 Diarrhea, unspecified: Secondary | ICD-10-CM | POA: Diagnosis not present

## 2023-12-10 DIAGNOSIS — G032 Benign recurrent meningitis [Mollaret]: Secondary | ICD-10-CM | POA: Diagnosis not present

## 2023-12-10 DIAGNOSIS — G43909 Migraine, unspecified, not intractable, without status migrainosus: Secondary | ICD-10-CM | POA: Diagnosis present

## 2023-12-10 DIAGNOSIS — Z882 Allergy status to sulfonamides status: Secondary | ICD-10-CM

## 2023-12-10 DIAGNOSIS — Z86718 Personal history of other venous thrombosis and embolism: Secondary | ICD-10-CM

## 2023-12-10 DIAGNOSIS — Z5986 Financial insecurity: Secondary | ICD-10-CM

## 2023-12-10 DIAGNOSIS — A419 Sepsis, unspecified organism: Secondary | ICD-10-CM | POA: Diagnosis present

## 2023-12-10 DIAGNOSIS — E785 Hyperlipidemia, unspecified: Secondary | ICD-10-CM | POA: Diagnosis present

## 2023-12-10 DIAGNOSIS — D801 Nonfamilial hypogammaglobulinemia: Secondary | ICD-10-CM | POA: Diagnosis present

## 2023-12-10 DIAGNOSIS — Z90710 Acquired absence of both cervix and uterus: Secondary | ICD-10-CM

## 2023-12-10 DIAGNOSIS — Z83719 Family history of colon polyps, unspecified: Secondary | ICD-10-CM

## 2023-12-10 DIAGNOSIS — E860 Dehydration: Secondary | ICD-10-CM | POA: Diagnosis present

## 2023-12-10 DIAGNOSIS — M3501 Sicca syndrome with keratoconjunctivitis: Secondary | ICD-10-CM | POA: Diagnosis present

## 2023-12-10 DIAGNOSIS — R339 Retention of urine, unspecified: Secondary | ICD-10-CM | POA: Diagnosis present

## 2023-12-10 DIAGNOSIS — K219 Gastro-esophageal reflux disease without esophagitis: Secondary | ICD-10-CM | POA: Diagnosis present

## 2023-12-10 DIAGNOSIS — Z8249 Family history of ischemic heart disease and other diseases of the circulatory system: Secondary | ICD-10-CM

## 2023-12-10 DIAGNOSIS — F419 Anxiety disorder, unspecified: Secondary | ICD-10-CM | POA: Diagnosis present

## 2023-12-10 DIAGNOSIS — Z8616 Personal history of COVID-19: Secondary | ICD-10-CM | POA: Diagnosis not present

## 2023-12-10 DIAGNOSIS — G894 Chronic pain syndrome: Secondary | ICD-10-CM | POA: Diagnosis present

## 2023-12-10 DIAGNOSIS — Z8542 Personal history of malignant neoplasm of other parts of uterus: Secondary | ICD-10-CM

## 2023-12-10 DIAGNOSIS — T50905A Adverse effect of unspecified drugs, medicaments and biological substances, initial encounter: Secondary | ICD-10-CM

## 2023-12-10 DIAGNOSIS — Z923 Personal history of irradiation: Secondary | ICD-10-CM

## 2023-12-10 DIAGNOSIS — Z79899 Other long term (current) drug therapy: Secondary | ICD-10-CM

## 2023-12-10 DIAGNOSIS — Z8661 Personal history of infections of the central nervous system: Secondary | ICD-10-CM

## 2023-12-10 DIAGNOSIS — E876 Hypokalemia: Secondary | ICD-10-CM | POA: Diagnosis present

## 2023-12-10 DIAGNOSIS — R519 Headache, unspecified: Principal | ICD-10-CM

## 2023-12-10 LAB — URINALYSIS, W/ REFLEX TO CULTURE (INFECTION SUSPECTED)
Bacteria, UA: NONE SEEN
Bilirubin Urine: NEGATIVE
Glucose, UA: NEGATIVE mg/dL
Hgb urine dipstick: NEGATIVE
Ketones, ur: NEGATIVE mg/dL
Leukocytes,Ua: NEGATIVE
Nitrite: NEGATIVE
Protein, ur: NEGATIVE mg/dL
Specific Gravity, Urine: 1.013 (ref 1.005–1.030)
pH: 8 (ref 5.0–8.0)

## 2023-12-10 LAB — CBC WITH DIFFERENTIAL/PLATELET
Abs Immature Granulocytes: 0.01 10*3/uL (ref 0.00–0.07)
Basophils Absolute: 0 10*3/uL (ref 0.0–0.1)
Basophils Relative: 1 %
Eosinophils Absolute: 0.5 10*3/uL (ref 0.0–0.5)
Eosinophils Relative: 8 %
HCT: 34 % — ABNORMAL LOW (ref 36.0–46.0)
Hemoglobin: 11.5 g/dL — ABNORMAL LOW (ref 12.0–15.0)
Immature Granulocytes: 0 %
Lymphocytes Relative: 23 %
Lymphs Abs: 1.3 10*3/uL (ref 0.7–4.0)
MCH: 31.7 pg (ref 26.0–34.0)
MCHC: 33.8 g/dL (ref 30.0–36.0)
MCV: 93.7 fL (ref 80.0–100.0)
Monocytes Absolute: 0.6 10*3/uL (ref 0.1–1.0)
Monocytes Relative: 10 %
Neutro Abs: 3.3 10*3/uL (ref 1.7–7.7)
Neutrophils Relative %: 58 %
Platelets: 171 10*3/uL (ref 150–400)
RBC: 3.63 MIL/uL — ABNORMAL LOW (ref 3.87–5.11)
RDW: 16 % — ABNORMAL HIGH (ref 11.5–15.5)
WBC: 5.7 10*3/uL (ref 4.0–10.5)
nRBC: 0 % (ref 0.0–0.2)

## 2023-12-10 LAB — COMPREHENSIVE METABOLIC PANEL
ALT: 78 U/L — ABNORMAL HIGH (ref 0–44)
AST: 93 U/L — ABNORMAL HIGH (ref 15–41)
Albumin: 3.7 g/dL (ref 3.5–5.0)
Alkaline Phosphatase: 97 U/L (ref 38–126)
Anion gap: 12 (ref 5–15)
BUN: <5 mg/dL — ABNORMAL LOW (ref 6–20)
CO2: 28 mmol/L (ref 22–32)
Calcium: 8.9 mg/dL (ref 8.9–10.3)
Chloride: 99 mmol/L (ref 98–111)
Creatinine, Ser: 0.88 mg/dL (ref 0.44–1.00)
GFR, Estimated: >60 mL/min (ref >60)
Glucose, Bld: 117 mg/dL — ABNORMAL HIGH (ref 70–99)
Potassium: 2.6 mmol/L — CL (ref 3.5–5.1)
Sodium: 139 mmol/L (ref 135–145)
Total Bilirubin: 0.9 mg/dL (ref <1.2)
Total Protein: 8.1 g/dL (ref 6.5–8.1)

## 2023-12-10 LAB — PROCALCITONIN: Procalcitonin: <0.1 ng/mL

## 2023-12-10 LAB — POTASSIUM: Potassium: 3.7 mmol/L (ref 3.5–5.1)

## 2023-12-10 LAB — CBC
HCT: 29.9 % — ABNORMAL LOW (ref 36.0–46.0)
Hemoglobin: 10.1 g/dL — ABNORMAL LOW (ref 12.0–15.0)
MCH: 32 pg (ref 26.0–34.0)
MCHC: 33.8 g/dL (ref 30.0–36.0)
MCV: 94.6 fL (ref 80.0–100.0)
Platelets: 153 10*3/uL (ref 150–400)
RBC: 3.16 MIL/uL — ABNORMAL LOW (ref 3.87–5.11)
RDW: 16.2 % — ABNORMAL HIGH (ref 11.5–15.5)
WBC: 6.2 10*3/uL (ref 4.0–10.5)
nRBC: 0 % (ref 0.0–0.2)

## 2023-12-10 LAB — PROTIME-INR
INR: 1.2 (ref 0.8–1.2)
Prothrombin Time: 15.2 s (ref 11.4–15.2)

## 2023-12-10 LAB — MAGNESIUM: Magnesium: 1.3 mg/dL — ABNORMAL LOW (ref 1.7–2.4)

## 2023-12-10 LAB — LACTIC ACID, PLASMA
Lactic Acid, Venous: 1.8 mmol/L (ref 0.5–1.9)
Lactic Acid, Venous: 1.6 mmol/L (ref 0.5–1.9)
Lactic Acid, Venous: 1.1 mmol/L (ref 0.5–1.9)

## 2023-12-10 LAB — HIV ANTIBODY (ROUTINE TESTING W REFLEX): HIV Screen 4th Generation wRfx: NONREACTIVE

## 2023-12-10 LAB — CREATININE, SERUM
Creatinine, Ser: 0.88 mg/dL (ref 0.44–1.00)
GFR, Estimated: >60 mL/min (ref >60)

## 2023-12-10 LAB — APTT: aPTT: 30 s (ref 24–36)

## 2023-12-10 MED ORDER — NYSTATIN 100000 UNIT/GM EX POWD: 1.0000 | Freq: Two times a day (BID) | CUTANEOUS | Status: DC | PRN

## 2023-12-10 MED ORDER — MAGNESIUM SULFATE 2 GM/50ML IV SOLN
2.0000 g | Freq: Once | INTRAVENOUS | Status: AC
  Administered 2023-12-10: 2 g via INTRAVENOUS
  Filled 2023-12-10: qty 50

## 2023-12-10 MED ORDER — ENOXAPARIN SODIUM 40 MG/0.4ML IJ SOSY
40.0000 mg | PREFILLED_SYRINGE | INTRAMUSCULAR | Status: DC
  Administered 2023-12-10: 40 mg via SUBCUTANEOUS
  Filled 2023-12-10: qty 0.4

## 2023-12-10 MED ORDER — HYDROMORPHONE HCL 1 MG/ML IJ SOLN
0.5000 mg | INTRAMUSCULAR | Status: DC | PRN
  Administered 2023-12-10 (×2): 0.5 mg via INTRAVENOUS
  Filled 2023-12-10 (×2): qty 0.5

## 2023-12-10 MED ORDER — SODIUM CHLORIDE 0.9 % IV BOLUS
1000.0000 mL | Freq: Once | INTRAVENOUS | Status: AC
  Administered 2023-12-10: 1000 mL via INTRAVENOUS

## 2023-12-10 MED ORDER — BUTALBITAL-APAP-CAFFEINE 50-325-40 MG PO TABS
2.0000 | ORAL_TABLET | Freq: Once | ORAL | Status: AC
Start: 2023-12-10 — End: 2023-12-10
  Administered 2023-12-10: 2 via ORAL
  Filled 2023-12-10: qty 2

## 2023-12-10 MED ORDER — HYDROXYZINE HCL 25 MG PO TABS
25.0000 mg | ORAL_TABLET | Freq: Two times a day (BID) | ORAL | Status: DC | PRN
  Administered 2023-12-11: 25 mg via ORAL
  Filled 2023-12-10: qty 1

## 2023-12-10 MED ORDER — POLYVINYL ALCOHOL 1.4 % OP SOLN: 1.0000 [drp] | OPHTHALMIC | Status: DC | PRN

## 2023-12-10 MED ORDER — ACETAMINOPHEN 650 MG RE SUPP: 650.0000 mg | Freq: Four times a day (QID) | RECTAL | Status: DC | PRN

## 2023-12-10 MED ORDER — LACTATED RINGERS IV BOLUS
1000.0000 mL | Freq: Once | INTRAVENOUS | Status: AC
  Administered 2023-12-10: 1000 mL via INTRAVENOUS

## 2023-12-10 MED ORDER — TRAZODONE HCL 50 MG PO TABS: 25.0000 mg | ORAL_TABLET | Freq: Every evening | ORAL | Status: DC | PRN

## 2023-12-10 MED ORDER — VANCOMYCIN HCL 1750 MG/350ML IV SOLN: 1750.0000 mg | INTRAVENOUS | Status: DC

## 2023-12-10 MED ORDER — TOPIRAMATE 100 MG PO TABS
100.0000 mg | ORAL_TABLET | Freq: Every day | ORAL | Status: DC
  Administered 2023-12-11 – 2023-12-15 (×5): 100 mg via ORAL
  Filled 2023-12-10 (×5): qty 1

## 2023-12-10 MED ORDER — FAMOTIDINE 20 MG PO TABS
40.0000 mg | ORAL_TABLET | Freq: Every day | ORAL | Status: DC
Start: 2023-12-10 — End: 2023-12-10

## 2023-12-10 MED ORDER — NOREPINEPHRINE 4 MG/250ML-% IV SOLN
2.0000 ug/min | INTRAVENOUS | Status: DC
  Administered 2023-12-10: 2 ug/min via INTRAVENOUS

## 2023-12-10 MED ORDER — NALOXONE HCL 4 MG/0.1ML NA LIQD: 1.0000 | Freq: Once | NASAL | Status: DC

## 2023-12-10 MED ORDER — MOMETASONE FURO-FORMOTEROL FUM 100-5 MCG/ACT IN AERO
2.0000 | INHALATION_SPRAY | Freq: Two times a day (BID) | RESPIRATORY_TRACT | Status: DC
  Administered 2023-12-10 – 2023-12-15 (×9): 2 via RESPIRATORY_TRACT
  Filled 2023-12-10 (×4): qty 8.8

## 2023-12-10 MED ORDER — SODIUM CHLORIDE 0.9 % IV SOLN
2.0000 g | Freq: Two times a day (BID) | INTRAVENOUS | Status: DC
  Administered 2023-12-10 – 2023-12-11 (×3): 2 g via INTRAVENOUS
  Filled 2023-12-10 (×3): qty 20

## 2023-12-10 MED ORDER — POLYETHYLENE GLYCOL 3350 17 G PO PACK
17.0000 g | PACK | Freq: Every day | ORAL | Status: DC
Start: 2023-12-11 — End: 2023-12-15
  Administered 2023-12-11 – 2023-12-12 (×2): 17 g via ORAL
  Filled 2023-12-10 (×4): qty 1

## 2023-12-10 MED ORDER — ACETAMINOPHEN 500 MG PO TABS: 1000.0000 mg | ORAL_TABLET | Freq: Once | ORAL | Status: DC

## 2023-12-10 MED ORDER — SODIUM CHLORIDE 0.9 % IV SOLN: INTRAVENOUS | Status: DC

## 2023-12-10 MED ORDER — OXYMETAZOLINE HCL 0.05 % NA SOLN
1.0000 | Freq: Once | NASAL | Status: AC
Start: 2023-12-10 — End: 2023-12-10
  Administered 2023-12-10: 1 via NASAL
  Filled 2023-12-10: qty 30

## 2023-12-10 MED ORDER — ONDANSETRON HCL 4 MG PO TABS: 4.0000 mg | ORAL_TABLET | Freq: Four times a day (QID) | ORAL | Status: DC | PRN

## 2023-12-10 MED ORDER — DEXTROSE 5 % IV SOLN
660.0000 mg | Freq: Three times a day (TID) | INTRAVENOUS | Status: DC
  Administered 2023-12-10 – 2023-12-11 (×4): 660 mg via INTRAVENOUS
  Filled 2023-12-10 (×5): qty 13.2

## 2023-12-10 MED ORDER — ALBUTEROL SULFATE (2.5 MG/3ML) 0.083% IN NEBU: 2.5000 mg | INHALATION_SOLUTION | RESPIRATORY_TRACT | Status: DC | PRN

## 2023-12-10 MED ORDER — SERTRALINE HCL 50 MG PO TABS: 25.0000 mg | ORAL_TABLET | Freq: Every day | ORAL | Status: DC

## 2023-12-10 MED ORDER — SODIUM CHLORIDE 0.9 % IV SOLN
250.0000 mL | INTRAVENOUS | Status: DC
  Administered 2023-12-10: 250 mL via INTRAVENOUS

## 2023-12-10 MED ORDER — VANCOMYCIN HCL 10 G IV SOLR
1750.0000 mg | Freq: Once | INTRAVENOUS | Status: AC
  Administered 2023-12-10: 1749.978 mg via INTRAVENOUS
  Filled 2023-12-10: qty 17.5

## 2023-12-10 MED ORDER — HYDROMORPHONE HCL 1 MG/ML IJ SOLN
1.0000 mg | INTRAMUSCULAR | Status: AC | PRN
  Administered 2023-12-10 – 2023-12-11 (×3): 1 mg via INTRAVENOUS
  Filled 2023-12-10 (×3): qty 1

## 2023-12-10 MED ORDER — DEXTROSE 5 % IV SOLN
10.0000 mg/kg | Freq: Once | INTRAVENOUS | Status: AC
  Administered 2023-12-10: 545 mg via INTRAVENOUS
  Filled 2023-12-10: qty 10.9

## 2023-12-10 MED ORDER — NYSTATIN 100000 UNIT/ML MT SUSP: 10.0000 mL | Freq: Three times a day (TID) | OROMUCOSAL | Status: DC | PRN

## 2023-12-10 MED ORDER — FUROSEMIDE 40 MG PO TABS: 60.0000 mg | ORAL_TABLET | Freq: Two times a day (BID) | ORAL | Status: DC | PRN

## 2023-12-10 MED ORDER — NOREPINEPHRINE 4 MG/250ML-% IV SOLN
INTRAVENOUS | Status: AC
  Filled 2023-12-10: qty 250

## 2023-12-10 MED ORDER — PREDNISOLONE ACETATE 1 % OP SUSP: 1.0000 [drp] | OPHTHALMIC | Status: DC | PRN

## 2023-12-10 MED ORDER — MIDODRINE HCL 5 MG PO TABS
10.0000 mg | ORAL_TABLET | Freq: Three times a day (TID) | ORAL | Status: DC
  Administered 2023-12-11 – 2023-12-15 (×14): 10 mg via ORAL
  Filled 2023-12-10 (×14): qty 2

## 2023-12-10 MED ORDER — MAGNESIUM HYDROXIDE 400 MG/5ML PO SUSP: 30.0000 mL | Freq: Every day | ORAL | Status: DC | PRN

## 2023-12-10 MED ORDER — LACTATED RINGERS IV SOLN
150.0000 mL/h | INTRAVENOUS | Status: DC
Start: 2023-12-10 — End: 2023-12-10
  Administered 2023-12-10: 150 mL/h via INTRAVENOUS

## 2023-12-10 MED ORDER — MIDODRINE HCL 5 MG PO TABS
5.0000 mg | ORAL_TABLET | Freq: Three times a day (TID) | ORAL | Status: DC
  Administered 2023-12-10: 5 mg via ORAL
  Filled 2023-12-10: qty 1

## 2023-12-10 MED ORDER — VANCOMYCIN HCL IN DEXTROSE 1-5 GM/200ML-% IV SOLN
1000.0000 mg | Freq: Two times a day (BID) | INTRAVENOUS | Status: DC
Start: 2023-12-11 — End: 2023-12-11
  Filled 2023-12-10: qty 200

## 2023-12-10 MED ORDER — VANCOMYCIN HCL 1750 MG/350ML IV SOLN
1750.0000 mg | Freq: Once | INTRAVENOUS | Status: DC
  Filled 2023-12-10: qty 350

## 2023-12-10 MED ORDER — ONDANSETRON HCL 4 MG/2ML IJ SOLN
4.0000 mg | Freq: Four times a day (QID) | INTRAMUSCULAR | Status: DC | PRN
Start: 2023-12-10 — End: 2023-12-11
  Administered 2023-12-10: 4 mg via INTRAVENOUS
  Filled 2023-12-10: qty 2

## 2023-12-10 MED ORDER — POTASSIUM CHLORIDE CRYS ER 20 MEQ PO TBCR
40.0000 meq | EXTENDED_RELEASE_TABLET | Freq: Once | ORAL | Status: AC
  Administered 2023-12-10: 40 meq via ORAL
  Filled 2023-12-10 (×2): qty 2

## 2023-12-10 MED ORDER — POTASSIUM CHLORIDE CRYS ER 20 MEQ PO TBCR
40.0000 meq | EXTENDED_RELEASE_TABLET | Freq: Once | ORAL | Status: AC
  Administered 2023-12-10: 40 meq via ORAL
  Filled 2023-12-10: qty 2

## 2023-12-10 MED ORDER — TIZANIDINE HCL 2 MG PO TABS
2.0000 mg | ORAL_TABLET | Freq: Three times a day (TID) | ORAL | Status: DC
  Administered 2023-12-10: 2 mg via ORAL
  Filled 2023-12-10: qty 1

## 2023-12-10 MED ORDER — EPINEPHRINE 0.3 MG/0.3ML IJ SOAJ: 0.3000 mg | INTRAMUSCULAR | Status: DC | PRN

## 2023-12-10 MED ORDER — VALACYCLOVIR HCL 500 MG PO TABS: 1000.0000 mg | ORAL_TABLET | Freq: Every day | ORAL | Status: DC

## 2023-12-10 MED ORDER — DICYCLOMINE HCL 10 MG PO CAPS: 10.0000 mg | ORAL_CAPSULE | Freq: Every day | ORAL | Status: DC | PRN

## 2023-12-10 MED ORDER — RIZATRIPTAN BENZOATE 10 MG PO TBDP: 10.0000 mg | ORAL_TABLET | ORAL | Status: DC

## 2023-12-10 MED ORDER — DROPERIDOL 2.5 MG/ML IJ SOLN
2.5000 mg | Freq: Once | INTRAMUSCULAR | Status: AC
  Administered 2023-12-10: 2.5 mg via INTRAVENOUS
  Filled 2023-12-10: qty 2

## 2023-12-10 MED ORDER — PANTOPRAZOLE SODIUM 40 MG PO TBEC
40.0000 mg | DELAYED_RELEASE_TABLET | Freq: Every day | ORAL | Status: DC
  Administered 2023-12-10 – 2023-12-15 (×6): 40 mg via ORAL
  Filled 2023-12-10 (×6): qty 1

## 2023-12-10 MED ORDER — DEXTROSE 5 % IV SOLN
10.0000 mg/kg | Freq: Once | INTRAVENOUS | Status: DC
  Filled 2023-12-10: qty 10.9

## 2023-12-10 MED ORDER — IMMUNE GLOBULIN (HUMAN) 40 GM/400ML IV SOLN: 40.0000 g | INTRAVENOUS | Status: DC

## 2023-12-10 MED ORDER — SODIUM CHLORIDE 0.9 % IV SOLN
2.0000 g | Freq: Once | INTRAVENOUS | Status: AC
  Administered 2023-12-10: 2 g via INTRAVENOUS
  Filled 2023-12-10: qty 20

## 2023-12-10 MED ORDER — TRIAMCINOLONE ACETONIDE 0.1 % EX CREA: 1.0000 | TOPICAL_CREAM | Freq: Every day | CUTANEOUS | Status: DC | PRN

## 2023-12-10 MED ORDER — ACETAMINOPHEN 325 MG PO TABS
650.0000 mg | ORAL_TABLET | Freq: Four times a day (QID) | ORAL | Status: DC | PRN
  Administered 2023-12-10 (×2): 650 mg via ORAL
  Filled 2023-12-10 (×2): qty 2

## 2023-12-10 MED ORDER — KETOROLAC TROMETHAMINE 30 MG/ML IJ SOLN
15.0000 mg | Freq: Once | INTRAMUSCULAR | Status: AC
  Administered 2023-12-10: 15 mg via INTRAVENOUS
  Filled 2023-12-10: qty 1

## 2023-12-10 MED ORDER — FOLIC ACID 1 MG PO TABS
2.0000 mg | ORAL_TABLET | Freq: Every day | ORAL | Status: DC
  Administered 2023-12-11 – 2023-12-15 (×5): 2 mg via ORAL
  Filled 2023-12-10 (×5): qty 2

## 2023-12-10 MED ORDER — VANCOMYCIN HCL IN DEXTROSE 1-5 GM/200ML-% IV SOLN: 1000.0000 mg | Freq: Once | INTRAVENOUS | Status: DC

## 2023-12-10 MED ORDER — POTASSIUM CHLORIDE CRYS ER 20 MEQ PO TBCR: 20.0000 meq | EXTENDED_RELEASE_TABLET | Freq: Every day | ORAL | Status: DC | PRN

## 2023-12-10 MED ORDER — HYDROXYZINE HCL 25 MG PO TABS: 25.0000 mg | ORAL_TABLET | Freq: Two times a day (BID) | ORAL | Status: DC

## 2023-12-10 MED ORDER — CYCLOSPORINE 0.05 % OP EMUL
1.0000 [drp] | Freq: Two times a day (BID) | OPHTHALMIC | Status: DC
  Administered 2023-12-10 – 2023-12-15 (×10): 1 [drp] via OPHTHALMIC
  Filled 2023-12-10 (×11): qty 30

## 2023-12-10 MED ORDER — PRAVASTATIN SODIUM 40 MG PO TABS
40.0000 mg | ORAL_TABLET | Freq: Every day | ORAL | Status: DC
  Administered 2023-12-10 – 2023-12-14 (×5): 40 mg via ORAL
  Filled 2023-12-10 (×3): qty 1
  Filled 2023-12-10 (×2): qty 2

## 2023-12-10 MED ORDER — BUTALBITAL-APAP-CAFFEINE 50-325-40 MG PO TABS
1.0000 | ORAL_TABLET | Freq: Four times a day (QID) | ORAL | Status: DC | PRN
  Administered 2023-12-10 – 2023-12-11 (×3): 1 via ORAL
  Filled 2023-12-10 (×3): qty 1

## 2023-12-10 MED ORDER — HYDROMORPHONE HCL 1 MG/ML IJ SOLN: 1.0000 mg | INTRAMUSCULAR | Status: DC | PRN

## 2023-12-10 MED ORDER — MORPHINE SULFATE (PF) 4 MG/ML IV SOLN
4.0000 mg | Freq: Once | INTRAVENOUS | Status: AC
  Administered 2023-12-10: 4 mg via INTRAVENOUS
  Filled 2023-12-10: qty 1

## 2023-12-10 MED ORDER — AMPHETAMINE-DEXTROAMPHETAMINE 10 MG PO TABS
20.0000 mg | ORAL_TABLET | Freq: Three times a day (TID) | ORAL | Status: DC
  Administered 2023-12-11: 20 mg via ORAL
  Filled 2023-12-10 (×2): qty 2

## 2023-12-10 MED ORDER — SUMATRIPTAN SUCCINATE 50 MG PO TABS
50.0000 mg | ORAL_TABLET | ORAL | Status: DC | PRN
  Administered 2023-12-10 – 2023-12-13 (×4): 50 mg via ORAL
  Filled 2023-12-10 (×6): qty 1

## 2023-12-10 NOTE — ED Provider Notes (Signed)
[facility] Provider Note    Event Date/Time   First MD Initiated Contact with Patient 12/10/23 0040     (approximate)   History   Fever and Headache   HPI  [name] Horn is a 47 y.o. female who presents to the ED for evaluation of Fever and Headache   Review of medical DC summary from 9/26.  History of antibody deficiency syndrome receiving IVIG regularly.  RA, migraines, eosinophilic esophagitis.  Aseptic meningitis in April 2024.  During June admission, 3 LP attempts were all unsuccessful, including 2 with IR  Patient presents to the ED for evaluation of fever and headache in the past few hours.  Worsening IVIG yesterday.  Reports severe headache starting tonight and this is similar as when she had aseptic meningitis in the past.   Physical Exam   Triage Vital Signs: ED Triage Vitals  Encounter Vitals Group     BP 12/10/23 0024 128/68     Systolic BP Percentile --      Diastolic BP Percentile --      Pulse Rate 12/10/23 0024 (!) 120     Resp 12/10/23 0024 19     Temp 12/10/23 0024 (!) 102.7 F (39.3 C)     Temp Source 12/10/23 0024 Oral     SpO2 12/10/23 0024 97 %     Weight --      Height --      Head Circumference --      Peak Flow --      Pain Score 12/10/23 0030 10     Pain Loc --      Pain Education --      Exclude from Growth Chart --     Most recent vital signs: Vitals:   12/10/23 0330 12/10/23 0405  BP: 104/63 111/71  Pulse: (!) 101 (!) 104  Resp: 15 19  Temp:  99.1 F (37.3 C)  SpO2: 95% 95%    General: Awake, no distress.  Photophobic, seems uncomfortable CV:  Good peripheral perfusion.  Resp:  Normal effort.  Abd:  No distention.  MSK:  No deformity noted.  Neuro:  No focal deficits appreciated. Cranial nerves II through XII intact 5/5 strength and sensation in all 4 extremities Other:     ED Results / Procedures / Treatments   Labs (all labs ordered are listed, but only abnormal results are  displayed) Labs Reviewed  COMPREHENSIVE METABOLIC PANEL - Abnormal; Notable for the following components:      Result Value   Potassium 2.6 (*)    Glucose, Bld 117 (*)    BUN <5 (*)    AST 93 (*)    ALT 78 (*)    All other components within normal limits  CBC WITH DIFFERENTIAL/PLATELET - Abnormal; Notable for the following components:   RBC 3.63 (*)    Hemoglobin 11.5 (*)    HCT 34.0 (*)    RDW 16.0 (*)    All other components within normal limits  MAGNESIUM - Abnormal; Notable for the following components:   Magnesium 1.3 (*)    All other components within normal limits  CULTURE, BLOOD (ROUTINE X 2)  CULTURE, BLOOD (ROUTINE X 2)  LACTIC ACID, PLASMA  PROTIME-INR  APTT  PROCALCITONIN  LACTIC ACID, PLASMA  URINALYSIS, W/ REFLEX TO CULTURE (INFECTION SUSPECTED)    EKG Sinus rhythm with a rate of 96 bpm.  Normal axis and intervals.  No clear signs of acute ischemia.  RADIOLOGY  Official radiology report(s): DG Chest Port 1 View Result Date: 12/10/2023 CLINICAL DATA:  Sepsis EXAM: PORTABLE CHEST 1 VIEW COMPARISON:  08/25/2023 FINDINGS: The heart size and mediastinal contours are within normal limits. Noted. Both lungs are clear. The visualized skeletal structures are unremarkable. The implanted loop recorder IMPRESSION: No active disease. Electronically Signed   By: Helyn Numbers M.D.   On: 12/10/2023 01:08    PROCEDURES and INTERVENTIONS:  .1-3 Lead EKG Interpretation  Performed by: Delton Prairie, MD Authorized by: Delton Prairie, MD     Interpretation: abnormal     ECG rate:  110   ECG rate assessment: tachycardic     Rhythm: sinus tachycardia     Ectopy: none     Conduction: normal   .Critical Care  Performed by: Delton Prairie, MD Authorized by: Delton Prairie, MD   Critical care provider statement:    Critical care time (minutes):  30   Critical care time was exclusive of:  Separately billable procedures and treating other patients   Critical care was necessary  to treat or prevent imminent or life-threatening deterioration of the following conditions:  Sepsis   Critical care was time spent personally by me on the following activities:  Development of treatment plan with patient or surrogate, discussions with consultants, evaluation of patient's response to treatment, examination of patient, ordering and review of laboratory studies, ordering and review of radiographic studies, ordering and performing treatments and interventions, pulse oximetry, re-evaluation of patient's condition and review of old charts   Medications  morphine (PF) 4 MG/ML injection 4 mg (has no administration in time range)  vancomycin (VANCOREADY) IVPB 1750 mg/350 mL (has no administration in time range)  cefTRIAXone (ROCEPHIN) 2 g in sodium chloride 0.9 % 100 mL IVPB (has no administration in time range)  lactated ringers bolus 1,000 mL (0 mLs Intravenous Stopped 12/10/23 0226)  droperidol (INAPSINE) 2.5 MG/ML injection 2.5 mg (2.5 mg Intravenous Given 12/10/23 0118)  ketorolac (TORADOL) 30 MG/ML injection 15 mg (15 mg Intravenous Given 12/10/23 0120)  butalbital-acetaminophen-caffeine (FIORICET) 50-325-40 MG per tablet 2 tablet (2 tablets Oral Given 12/10/23 0112)  potassium chloride SA (KLOR-CON M) CR tablet 40 mEq (40 mEq Oral Given 12/10/23 0222)  magnesium sulfate IVPB 2 g 50 mL (0 g Intravenous Stopped 12/10/23 0329)  oxymetazoline (AFRIN) 0.05 % nasal spray 1 spray (1 spray Each Nare Given 12/10/23 0224)  potassium chloride SA (KLOR-CON M) CR tablet 40 mEq (40 mEq Oral Given 12/10/23 0348)     IMPRESSION / MDM / ASSESSMENT AND PLAN / ED COURSE  I reviewed the triage vital signs and the nursing notes.  Differential diagnosis includes, but is not limited to, meningitis or encephalitis, viral or bacterial, migraine, tension headache, ICH, stroke or seizure  {Patient presents with symptoms of an acute illness or injury that is potentially life-threatening.  Patient  presents with fever and headache concerning for possibility of recurrence of aseptic meningitis.  Presents febrile and tachycardic but stable.  Blood work is reassuring without leukocytosis, lactic acidosis.  Hypokalemia is noted and replaced orally and IV.  Negative procalcitonin is reassuring.  Clinical Course as of 12/10/23 0443  Sun Dec 10, 2023  0319 Reassessed.  She is comfortably asleep when I entered the room.  She awakens to vocal stimulation and reports feeling better.  Mild residual headache.  Tachycardia is resolved. [DS]  0440 Reassessed.  She reports return of severe headache.  We discussed plan of care and shared decision making gets plan to  pursue medical admission, empiric antibiotics and antivirals, holding off on LP because of the previous multiple attempts. [DS]  8413 I consult with medicine who agrees to admit [DS]    Clinical Course User Index [DS] Delton Prairie, MD     FINAL CLINICAL IMPRESSION(S) / ED DIAGNOSES   Final diagnoses:  Hypokalemia  Bad headache     Rx / DC Orders   ED Discharge Orders     None        Note:  This document was prepared using Dragon voice recognition software and may include unintentional dictation errors.   Delton Prairie, MD 12/10/23 [phone]

## 2023-12-10 NOTE — ED Notes (Signed)
Called pharmacy about un verified medications. Explained to RN that they were waiting on a "med rec."

## 2023-12-10 NOTE — ED Notes (Signed)
Call light answered, PT stating anxiety about vitals monitor beeping. PT and family educated about device. PT and family requesting to speak to RN about PRN medication. RN notified.

## 2023-12-10 NOTE — Assessment & Plan Note (Signed)
-   We will continue Zoloft 

## 2023-12-10 NOTE — ED Notes (Signed)
Sent pharmacy message about "Acyclovir" dose to be sent up!

## 2023-12-10 NOTE — Consult Note (Signed)
NAME:  [name] Horn, MRN:  [id number], DOB:  1976-07-13, LOS: 0 ADMISSION DATE:  12/10/2023, CONSULTATION DATE:  12/10/23 REFERRING MD: Lorretta Harp, REASON FOR CONSULT:  Hypotension   HPI  47 y.o  female with complicated PMH including Malignant Uterine Sarcoma s/p total abdominal hysterectomy (TAH), urinary retention, chronic pyelonephritis, NSAT with a loop recorder, EOE, chronic cutaneous candidiasis, seropositive RA, tracheobronchomalacia, IgG subclass deficiency, keratoconjunctivitis sicca, and osteoarthritis  who presented to the ED with chief complaints of recurrent fevers post IVIG.  On review of chart, patient has had multiple admission this year with recent admission at [facility] from 11/08/23 - 11/23/23 for Mucocutaneous Rash 2/2 Disseminated HSV-1Erythema Multiforme and Sepsis 2/2 Disseminated HSV. Work up revealed + serum Abs for HSV1 and HSV2, + serum HSV PCR (was on tx ); skin biopsy with interface dermatitis c/w erythema multiforme. Dermatology, ENT, Ophthalmology, GI, oral medicine, and Infectious Disease followed while inpatient. Hard palate biopsy non-diagnostic, but negative for HSV. Pt underwent EGD with no evidence of Candida, CMV, or HSV esophagitis, although was consistent with known diagnosis of eosinophilic esophagitis. She was treated with high-dose IV acyclovir for 14 days with end of treatment 11/22/2023. At discharge she was transitioned to valacyclovir 500 mg twice daily for indefinite suppression.  Since discharge she has followed up with [facility] and immunology clinic and also had IVIG treatment on 12/08/2023.   ED Course: Initial vital signs showed HR of 120 beats/minute, BP 128/68 mm Hg, the RR 19 breaths/minute, and the oxygen saturation 97% on  RA and a temperature of 102.7 F (39.3 C)  Pertinent Labs/Diagnostics Findings: Na+/ K+/Mg:139/2.6/1.3  Glucose: 11, AST/ALT:93/78 WBC: unremarkable without bands or neutrophil predominance   PCT: negative <0.10   CXR> no active disease Medication Administered in the ED: morphine (PF) 4 MG IV vancomycin IVPB 1750 mg/350 mL, ceftriaxone 2 g , LR 1,000 Ml, Droperidol  2.5 MG/ML injection 2.5 mg, ketorolac 30 MG/ML injection 15 mg, FIORICET) 50-325-40 , potassium chloride SA CR tablet 40 mEq (40 mEq Oral X2, magnesium sulfate IVPB 2 g 50 Ml, oxymetazoline 0.05 % nasal spray 1 spray (1 spray Each Nare Disposition: Admitted to stepdown unit by hospitalist service for evaluation of sepsis secondary to suspected meningitis  Past Medical History  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  Remained hypotensive despite IV fluid bolus requiring vasopressors.  PCCM consulted  Consults:  PCCM ID  Procedures:  None  Significant Diagnostic Tests:  12/15: Chest Xray> no acute cardiopulmonary process   Interim History / Subjective:      Micro Data:  12/15: Blood culture x2>  Antimicrobials:  Vancomycin Cefepime Ceftriaxone  OBJECTIVE  Blood pressure 96/65, pulse 72, temperature 98.5 F (36.9 C), temperature source Oral, resp. rate 18, SpO2 96%.        Intake/Output Summary (Last 24 hours) at 12/10/2023 1919 Last data filed at 12/10/2023 1859 Gross per 24 hour  Intake 4846.18 ml  Output --  Net 4846.18 ml   There were no vitals filed for this visit.  Physical Examination  GENERAL:  year-old critically ill patient lying in the bed  EYES: PEERLA. No scleral icterus. Extraocular muscles intact.  HEENT: Head atraumatic, normocephalic. Oropharynx and nasopharynx clear.  NECK:  No JVD, supple  LUNGS: Normal breath sounds bilaterally.  No use  of accessory muscles of respiration.  CARDIOVASCULAR: S1, S2 normal. No murmurs, rubs, or gallops.  ABDOMEN: Soft, NTND EXTREMITIES: No swelling or erythema.  Capillary refill < 3 seconds in all extremities. Pulses palpable distally. NEUROLOGIC: The patient is . No focal neurological deficit appreciated. Cranial nerves are intact.  SKIN: No obvious rash, lesion, or ulcer. Warm to touch Labs/imaging that I havepersonally reviewed  (right click and "Reselect all SmartList Selections" daily)     Labs   CBC: Recent Labs  Lab 12/10/23 0105 12/10/23 0616  WBC 5.7 6.2  NEUTROABS 3.3  --   HGB 11.5* 10.1*  HCT 34.0* 29.9*  MCV 93.7 94.6  PLT 171 153    Basic Metabolic Panel: Recent Labs  Lab 12/10/23 0104 12/10/23 0105 12/10/23 0616 12/10/23 0958  NA  --  139  --   --   K  --  2.6*  --  3.7  CL  --  99  --   --   CO2  --  28  --   --   GLUCOSE  --  117*  --   --   BUN  --  <5*  --   --   CREATININE  --  0.88 0.88  --   CALCIUM  --  8.9  --   --   MG 1.3*  --   --   --    GFR: Estimated Creatinine Clearance: 81.4 mL/min (by C-G formula based on SCr of 0.88 mg/dL). Recent Labs  Lab 12/10/23 0105 12/10/23 0616  PROCALCITON <0.10  --   WBC 5.7 6.2  LATICACIDVEN 1.8  --     Liver Function Tests: Recent Labs  Lab 12/10/23 0105  AST 93*  ALT 78*  ALKPHOS 97  BILITOT 0.9  PROT 8.1  ALBUMIN 3.7   No results for input(s): "LIPASE", "AMYLASE" in the last 168 hours. No results for input(s): "AMMONIA" in the last 168 hours.  ABG    Component Value Date/Time   TCO2 25 04/25/2017 1429     Coagulation Profile: Recent Labs  Lab 12/10/23 0105  INR 1.2    Cardiac Enzymes: No results for input(s): "CKTOTAL", "CKMB", "CKMBINDEX", "TROPONINI" in the last 168 hours.  HbA1C: Hgb A1c MFr Bld  Date/Time Value Ref Range Status  06/06/2023 01:46 PM 5.7 (H) 4.8 - 5.6 % Final    Comment:    (NOTE) Pre diabetes:          5.7%-6.4%  Diabetes:               >6.4%  Glycemic control for   <7.0% adults with diabetes     CBG: No results for input(s): "GLUCAP" in the last 168 hours.  Review of Systems:   Review of Systems  Constitutional:  Positive for chills, fever and malaise/fatigue.  HENT: Negative.    Eyes: Negative.   Respiratory:  Negative for cough, hemoptysis, sputum production, shortness of breath and wheezing.   Cardiovascular:  Positive for leg swelling.  Gastrointestinal:  Positive for diarrhea, nausea and vomiting.  Genitourinary:  Positive for flank pain, frequency and urgency.  Musculoskeletal:  Positive for joint pain.  Skin:  Positive for itching and rash.  Psychiatric/Behavioral:  Positive for depression. The patient is nervous/anxious.     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   Surgical History    Past Surgical History:  Procedure Laterality Date   APPENDECTOMY     cheek biopsy  11/2020   CHOLECYSTECTOMY     ESOPHAGOGASTRODUODENOSCOPY N/A 04/30/2017   Procedure: ESOPHAGOGASTRODUODENOSCOPY (EGD);  Surgeon: Dorena Cookey, MD;  Location: [facility] ENDOSCOPY;  Service: Endoscopy;  Laterality: N/A;   HERNIA REPAIR     x2   KNEE SURGERY     x3   MINIMALLY INVASIVE FORAMINOTOMY CERVICAL SPINE     C6-T1, Nitka   SAVORY DILATION N/A 04/30/2017   Procedure: SAVORY DILATION;  Surgeon: Dorena Cookey, MD;  Location[facility] ENDOSCOPY;  Service: Endoscopy;  Laterality: N/A;   TONSILLECTOMY     TOTAL ABDOMINAL  HYSTERECTOMY     Sarcoma, s/p XRT     Social History   reports that she has never smoked. She has been exposed to tobacco smoke. She has never used smokeless tobacco. She reports that she does not currently use alcohol. She reports current drug use.   Family History   Her family history includes Angioedema in her maternal grandfather and mother; Arrhythmia in her father; Colon cancer in an other family member; Colonic polyp in her mother; Eczema in her mother; Hashimoto's thyroiditis in her mother; Hyperlipidemia in an other family member; Hypertension in an other family member; Throat cancer in her father.   Allergies Allergies  Allergen Reactions   Cyanoacrylate Hives   Other Itching and Rash    Dermabond surgical glue.  Dermabond surgical glue-blisters   Sulfa Antibiotics Anaphylaxis, Rash, Shortness Of Breath and Swelling    Angioedema (also)   Sulfonamide Derivatives Hives, Shortness Of Breath and Swelling    TONGUE SWELLS   Benadryl [Diphenhydramine Hcl] Other (See Comments)    Hyperactivity    Sulfamethoxazole     Other Reaction(s): Angioedema   Diphenhydramine    Diphenhydramine Hcl     Other Reaction(s): Other (See Comments)  HYPERACTIVITY, RESTLESS LEG   Silicone Rash    Watch band     Home Medications  Prior to Admission medications   Medication Sig Start Date End Date Taking? Authorizing Provider  albuterol (PROVENTIL) (2.5 MG/3ML) 0.083% nebulizer solution Take 3 mLs (2.5 mg total) by nebulization every 4 (four) hours as needed for wheezing or shortness of breath. 11/20/22  Yes Pahwani, Rinka R, MD  albuterol (VENTOLIN HFA) 108 (90 Base) MCG/ACT inhaler Inhale 1 puff into the lungs every 4 (four) hours as needed. 04/12/23  Yes [provider]  amphetamine-dextroamphetamine (ADDERALL) 20 MG tablet Take 20 mg by mouth 3 (three) times daily.   Yes [provider]  budesonide-formoterol (SYMBICORT) 80-4.5 MCG/ACT inhaler Inhale 2 puffs into the  lungs 2 (two) times daily. Can also take Q4 hr as needed for wheezing 11/17/22  Yes Adhikari, Willia Craze, MD  cycloSPORINE (RESTASIS) 0.05 % ophthalmic emulsion Place 1 drop into both eyes 2 (two) times daily.   Yes [provider]  dicyclomine (BENTYL) 10 MG capsule Take 10 mg by mouth daily as needed for nausea/vomiting. 01/02/19  Yes [provider]  famotidine (PEPCID) 40 MG tablet Take 40 mg by mouth at bedtime. 05/02/21  Yes [provider]  folic acid (FOLVITE) 1 MG tablet Take 2 tablets (2 mg total) by mouth daily. Patient taking differently:  Take 1 mg by mouth daily. 10/30/19  Yes Deveshwar, Janalyn Rouse, MD  furosemide (LASIX) 20 MG tablet Take 60 mg by mouth 2 (two) times daily as needed for fluid or edema.   Yes [provider]  hydrOXYzine (ATARAX) 25 MG tablet Take 1 tablet (25 mg total) by mouth 2 (two) times daily. Patient taking differently: Take 25 mg by mouth 2 (two) times daily as needed for itching or anxiety. 09/21/23  Yes Loyce Dys, MD  lactulose (CHRONULAC) 10 GM/15ML solution Take 30 mLs (20 g total) by mouth daily as needed for mild constipation. 09/21/23  Yes Loyce Dys, MD  naloxone [facility]) nasal spray 4 mg/0.1 mL Place 1 spray into the nose once. 02/13/23  Yes [provider]  nystatin (MYCOSTATIN) 100000 UNIT/ML suspension Take 10 mLs (1,000,000 Units total) by mouth 3 (three) times daily. Patient taking differently: Take 10 mLs by mouth 3 (three) times daily as needed (Mouth sores). 12/07/20  Yes Randall Hiss, MD  nystatin powder Apply 1 application topically 2 (two) times daily as needed (rash).   Yes [provider]  ondansetron (ZOFRAN-ODT) 8 MG disintegrating tablet Take 1 tablet (8 mg total) by mouth every 8 (eight) hours as needed. 09/21/23  Yes Loyce Dys, MD  oxyCODONE (ROXICODONE) 15 MG immediate release tablet Take 15 mg by mouth every 4 (four) hours as needed. 12/04/23  Yes [provider]   pantoprazole (PROTONIX) 40 MG tablet Take 1 tablet (40 mg total) by mouth daily. Patient taking differently: Take 40 mg by mouth 2 (two) times daily. 09/21/23 09/20/24 Yes Djan, Scarlette Calico, MD  Polyethyl Glycol-Propyl Glycol (SYSTANE) 0.4-0.3 % GEL ophthalmic gel Place 1 application  into both eyes 2 (two) times daily as needed (Eye dryness).   Yes [provider]  Polyethyl Glycol-Propyl Glycol (SYSTANE) 0.4-0.3 % SOLN Apply 1 drop to eye in the morning, at noon, and at bedtime.   Yes [provider]  polyethylene glycol (MIRALAX / GLYCOLAX) 17 g packet Take 17 g by mouth daily. Patient taking differently: Take 17 g by mouth daily as needed for moderate constipation. 09/21/23  Yes Loyce Dys, MD  Potassium Chloride ER 20 MEQ TBCR Take 20 mEq by mouth daily as needed (when taking furosemide).   Yes [provider]  pravastatin (PRAVACHOL) 40 MG tablet Take 40 mg by mouth at bedtime.   Yes [provider]  prednisoLONE acetate (PRED FORTE) 1 % ophthalmic suspension Place 1 drop into the left eye as needed (conjunctivitis).   Yes [provider]  PRIVIGEN 40 GM/400ML SOLN Inject 40 g into the vein every 28 (twenty-eight) days. 11/02/23  Yes Shelba Flake, FNP  prochlorperazine (COMPAZINE) 10 MG tablet Take 1 tablet (10 mg total) by mouth every 6 (six) hours as needed for nausea or vomiting. 09/07/23  Yes Evon Slack, PA-C  promethazine (PHENERGAN) 25 MG tablet Take 1 tablet (25 mg total) by mouth every 6 (six) hours as needed for nausea or vomiting. 09/21/23  Yes Loyce Dys, MD  psyllium (HYDROCIL/METAMUCIL) 95 % PACK Take 1 packet by mouth daily. Patient taking differently: Take 1 packet by mouth daily as needed for moderate constipation. 09/21/23  Yes Loyce Dys, MD  rizatriptan (MAXALT-MLT) 10 MG disintegrating tablet Take 10 mg by mouth daily as needed for migraine.   Yes [provider]  tiZANidine (ZANAFLEX) 2 MG tablet Take 2  mg by mouth 3 (three) times daily. 08/28/23  Yes [provider]  topiramate (TOPAMAX) 100 MG tablet Take 100 mg by mouth daily. 06/18/19  Yes [provider]  topiramate (TOPAMAX) 25 MG tablet Take 25 mg by mouth at bedtime.   Yes [provider]  triamcinolone lotion (KENALOG) 0.1 % Apply 1 application topically daily as needed (rash). 07/07/20  Yes [provider]  valACYclovir (VALTREX) 1000 MG tablet Take 1 tablet (1,000 mg total) by mouth daily. Patient taking differently: Take 500 mg by mouth 2 (two) times daily. 08/12/20  Yes Daiva Eves, Lisette Grinder, MD  Scheduled Meds:  amphetamine-dextroamphetamine  20 mg Oral TID   cycloSPORINE  1 drop Both Eyes BID   enoxaparin (LOVENOX) injection  40 mg Subcutaneous Q24H   [START ON 12/11/2023] folic acid  2 mg Oral Daily   [START ON 12/11/2023] midodrine  10 mg Oral TID WC   mometasone-formoterol  2 puff Inhalation BID   naloxone  1 spray Nasal Once   pantoprazole  40 mg Oral Daily   [START ON 12/11/2023] polyethylene glycol  17 g Oral Daily   pravastatin  40 mg Oral QHS   [START ON 12/11/2023] topiramate  100 mg Oral Daily   Continuous Infusions:  sodium chloride 100 mL/hr at 12/10/23 1707   sodium chloride 250 mL (12/10/23 1836)   acyclovir (ZOVIRAX) 660 mg in dextrose 5 % 250 mL IVPB Stopped (12/10/23 2003)   cefTRIAXone (ROCEPHIN)  IV Stopped (12/10/23 1820)   norepinephrine (LEVOPHED) Adult infusion 1 mcg/min (12/10/23 2248)   [START ON 12/11/2023] vancomycin     PRN Meds:.acetaminophen **OR** acetaminophen, albuterol, butalbital-acetaminophen-caffeine, dicyclomine, EPINEPHrine, HYDROmorphone (DILAUDID) injection, hydrOXYzine, magnesium hydroxide, nystatin, nystatin, ondansetron **OR** ondansetron (ZOFRAN) IV, polyvinyl alcohol, potassium chloride SA, prednisoLONE acetate, SUMAtriptan, traZODone, triamcinolone cream    Active Hospital Problem list   See systems below  Assessment & Plan:  Sepsis with  Septic shock Secondary to Suspected IVIG Induced Aseptic Meningitis meets SIRS criteria: Heart Rate 102 beats/minute, Respiratory Rate 36 breaths/minute,Temperature 104.6, Shock Index (SI) Presenting  >36 h post IVIG infusion therapy, with headache, photophobia, nausea, vomiting and fever. Exam without neck stiffness or focal neurological signs. Blood work showed low inflammatory parameters. CTH negative. The CSF analysis pending LP -F/u cultures, trend lactic/ PCT -Monitor WBC/ fever curve -Continue ceftriaxone 2 g every 12 h will add ampicillin 2 g every 4 h. -IVF hydration as needed -Pressors for MAP goal >65 -Strict I/O's       Best practice:  Diet:  {ZOXW:96045} Pain/Anxiety/Delirium protocol (if indicated): {Pain/Anxiety/Delirium:26941} VAP protocol (if indicated): {VAP:29640} DVT prophylaxis: {DVT Prophylaxis:26933} GI prophylaxis: {GI:26934} Glucose control:  {Glucose Control:26935} Central venous access:  {Central Venous Access:26936} Arterial line:  {Central Venous Access:26936} Foley:  {Central Venous Access:26936} Mobility:  {Mobility:26937}  PT consulted: {PT Consult:26938} Last date of multidisciplinary goals of care discussion [***] Code Status:  {Code Status:26939} Disposition: ***   = Goals of Care = Code Status Order: FULL  Primary Emergency Contact: Johnson,Jason, Home Phone: [phone] Wishes to pursue full aggressive treatment and intervention options, including CPR and intubation, but goals of care will be addressed on going with family if that should become necessary.   Critical care time: 45 minutes        Webb Silversmith DNP, CCRN, FNP-C, AGACNP-BC Acute Care & Family Nurse Practitioner [facility] PCCM on call pager [phone]

## 2023-12-10 NOTE — Progress Notes (Signed)
Brief same day note:  Patient is a 47 year old female with history of antibody deficiency syndrome receiving IVIG regularly, recurrent aseptic meningitis, immune arthritis,  gastroparesis, DVT who presented from home with complaint of fever, headache.  Recently admitted to [facility] for disseminated herpes simplex 1 infection, takes Valtrex at home.  On presentation she was febrile up to 102.7, tachycardic.  Lab work showed severe hypokalemia, hypomagnesemia.  Procalcitonin less than 0.1.  Chest x-ray did not show any acute findings. Patient suspected to have another episode of aseptic meningitis.  But started on broad spectrum  antibiotics with acyclovir, vancomycin and ceftriaxone to cover for bacterial meningitis.  Plan for LP under fluoroscopic guidance tomorrow.  Sent message to ID.  Patient seen and examined at bedside today.  During evaluation, she was afebrile.  Complains of severe headache.  Her headache is most likely from her chronic migraine.  She was requesting her home hydrocodone to be resumed.  Assessment and plan:  Benign recurrent aseptic meningitis: Recently admitted for the same on April 2024.  Presented with high grade fever, headache.  She had IVIG just few days ago.  Last time also she had similar episode of aseptic  meningitis after IV Ig infusion Empirically started on broad-spectrum antibiotics because of immunocompromise status.  Plan to obtain LP under fluoroscopy. IR saying they will do it tomorrow Follow-up cultures.  Procalcitonin less than 0.1. I have also sent message to Dr. Rivka Safer who can see her tomorrow.  Severe hypokalemia/hypomagnesemia: Currently being supplemented and monitored  History of migraine:On  Topamax, Maxalt  Chronic pain syndrome: She has history of rheumatoid arthritis, spinal stenosis.  She says she takes high-dose hydrocodone at home.  History of GERD: On Protonix  Hyperlipidemia:On  Statin  Depression: On Zoloft  Plan  discussed with husband at bedside.

## 2023-12-10 NOTE — Progress Notes (Addendum)
I was asked by Dr. Renford Dills, attending physician, to see [name] for hypotension.  Chart review shows that patient was discharged by me on 05/06/2023 for sepsis secondary to aseptic meningitis.   She was seen at the bedside in the ED.   She complains of headache.  No other complaints.  No chest pain, shortness of breath, palpitations, dizziness, confusion.  She had received IV Dilaudid at 3:17 PM.  Her systolic BP on the cardiac monitor was 63/54.  Repeat BP done manually was 82/60.  Other vital signs were stable.  Normal respiratory rate, heart rate and oxygen saturation on room air.  GEN: NAD SKIN: Warm and dry EYES: EOMI, PERRLA, no pallor or icterus ENT: MMM, no neck stiffness CV: RRR PULM: CTA B ABD: soft, obese, NT, +BS CNS: AAO x 3, non focal EXT: No edema or tenderness   Assessment plan  Patient is already on empiric antimicrobial therapy (IV ceftriaxone, IV vancomycin and IV acyclovir for suspected aseptic meningitis with plan for LP tomorrow.  Patient had already been started on IV normal saline 1 L bolus which are just started running.  Plan to continue normal saline bolus for now.  Repeat BP after bolus.  Further management will be determined by subsequent blood pressures.  Vasopressors to be considered for refractory hypotension.  Plan discussed with Barbee Cough, RN.  She understands that she would have to continue to communicate with Dr. Renford Dills, attending physician, and notify him of any abnormal findings.  Case discussed with Dr. Renford Dills, attending physician.

## 2023-12-10 NOTE — ED Notes (Signed)
Hospitalist at bedside. Pt requesting morphine or dilaudid for 10/10 HA. Pt takes chronic opioids at home, q 4-5 hours. Lumbar puncture will be tomorrow, diet order placed per MD verbal order.

## 2023-12-10 NOTE — ED Notes (Signed)
MD at bedside. 

## 2023-12-10 NOTE — ED Notes (Signed)
Messaged pharmacy about patients dilaudid not being verified.

## 2023-12-10 NOTE — Assessment & Plan Note (Signed)
-   We will continue her Topamax and Maxalt.

## 2023-12-10 NOTE — Plan of Care (Signed)
IR was requested for fluoro guided LP.  Patient with  significant for antibody deficiency receiving IVIG regularly, rheumatoid arthritis, migraine, eosinophilic esophagitis, gastroparesis, DVT, IBD and aseptic meningitis for which she was admitted in April 2024 presented to ED with HA and fever, also tachycardic. LP attempted by IR/radiology twice in September 2024, both unsuccessful.   Discussed with Dr. Deanne Coffer and Dr. Renford Dills, will perform LP tomorrow when radiologists are on site for assistance if APP fails. LP is typically done in diagnostic radiology, but will keep order as IR lumbar puncture as may need to utilize IR room with C-arm capability.   Please call IR for questions and concerns.    Lynann Bologna Cozy Veale PA-C 12/10/2023 7:59 AM

## 2023-12-10 NOTE — ED Notes (Signed)
BP reading low, took 3 times. MD informed. Pt is alert, oriented. Denies dizziness.

## 2023-12-10 NOTE — ED Notes (Addendum)
Dr Myriam Forehand at bedside. Vear Clock monitor still reading very low. Last manual BP was 82/64.

## 2023-12-10 NOTE — ED Notes (Signed)
Have been in communication with hospitalist regarding BP readings. Manual BP was 82/60, machine is reading 70/50 with last reading 63/53. Pt has U/S IV now in case vasopressors ordered. New orders from hospitalist include 1L bolus and increased/scheduled midodrine. Pt is still alert, oriented but continues to complain of 9/10 HA.

## 2023-12-10 NOTE — ED Notes (Signed)
ICU doctor at bedside

## 2023-12-10 NOTE — ED Notes (Signed)
Attempted to find pharmacy tech to ask about medication requisition

## 2023-12-10 NOTE — ED Notes (Signed)
Patient medications due are not yet verified by pharmacy.

## 2023-12-10 NOTE — ED Notes (Signed)
Due medications still not verified by pharmacy

## 2023-12-10 NOTE — Assessment & Plan Note (Addendum)
-   The patient will be admitted to a medical telemetry bed. - We will continue antibiotic therapy with IV Rocephin and vancomycin. - We will continue her Zovirax for recent history of disseminated herpes simplex 1. - We will continue hydration with IV normal saline. - Pain management will be provided. - This could be related to her IVIG injection. - IR consult to be obtained for lumbar puncture.  She had a previous 3 dry lumbar punctures including 1 with IR.

## 2023-12-10 NOTE — ED Notes (Signed)
CRITICAL VALUE STICKER  CRITICAL VALUE: potassiu, 2.6  RECEIVER (on-site recipient of call): Zoyie  DATE & TIME NOTIFIED: 0145  MESSENGER (representative from lab): Arlys John  MD NOTIFIED: Dr Katrinka Blazing  TIME OF NOTIFICATION: 0147  RESPONSE: Will order potassium tablets

## 2023-12-10 NOTE — ED Notes (Addendum)
2-4 levo mcg per order

## 2023-12-10 NOTE — Progress Notes (Signed)
Pharmacy Antibiotic Note  [name] Horn is a 47 y.o. female admitted on 12/10/2023 with meningitis.  Pharmacy has been consulted for Vancomycin dosing for 14 days.  Plan: Vancomycin 1750 mg IV Q 24 hrs. Goal AUC 400-550. Expected AUC: 479.1 SCr used: 0.88  Pharmacy will continue to follow and will adjust abx dosing whenever warranted.  Temp (24hrs), Avg:100.9 F (38.3 C), Min:99.1 F (37.3 C), Max:102.7 F (39.3 C)   Recent Labs  Lab 12/10/23 0105  WBC 5.7  CREATININE 0.88  LATICACIDVEN 1.8    Estimated Creatinine Clearance: 81.4 mL/min (by C-G formula based on SCr of 0.88 mg/dL).    Allergies  Allergen Reactions   Cyanoacrylate Hives   Other Itching and Rash    Dermabond surgical glue.  Dermabond surgical glue-blisters   Sulfa Antibiotics Anaphylaxis, Rash, Shortness Of Breath and Swelling    Angioedema (also)   Sulfonamide Derivatives Hives, Shortness Of Breath and Swelling    TONGUE SWELLS   Benadryl [Diphenhydramine Hcl] Other (See Comments)    Hyperactivity    Sulfamethoxazole     Other Reaction(s): Angioedema   Diphenhydramine    Diphenhydramine Hcl     Other Reaction(s): Other (See Comments)  HYPERACTIVITY, RESTLESS LEG   Silicone Rash    Watch band    Antimicrobials this admission: 12/15 Ceftriaxone >> x 14 days 12/15 Vancomycin >> x 14 days 12/15 Acyclovir >> x 1 dose  Microbiology results: 12/15 BCx: Pending  Thank you for allowing pharmacy to be a part of this patient's care.  Otelia Sergeant, PharmD, [facility] 12/10/2023 5:33 AM

## 2023-12-10 NOTE — Progress Notes (Signed)
This is a no charge note  Patient is admitted due to a septic meningitis.  I was asked by Dr. Renford Dills, attending physician to see this pt since pt has developed hypotension with blood pressure 74/58. Dr. Aundria Rud of ICU is also consulted. Pt has already received 2 L IV fluid bolus, still hypotensive. I checked pt and did examination.  Patient reports headache, but no confusion.  Patient is alert and orientated x 3.  Moves all extremities normally. I ordered another 1L of NS bolus, but per Dr. Dr. Aundria Rud of ICU, will hold off more IVF bolus. Pt is started on Levophed by Dr. Aundria Rud.      Lorretta Harp, MD  [facility]   If 7PM-7AM, please contact night-coverage [email or web address] 12/10/2023, 8:14 PM

## 2023-12-10 NOTE — Consult Note (Addendum)
Pharmacy Antimicrobial Note  [name] Horn is a 47 y.o. female admitted on 12/10/2023 with meningitis. PMH is significant for recurrent aseptic meningitis, immune arthritis, gastroparesis, and DVT who presented from home with complaint of fever, headache. Recently admitted to [facility] for disseminated herpes simplex 1 infection. She takes Valtrex at home for this. She is suspected to be having another episode of aseptic meningitis. Pharmacy has been consulted for acyclovir dosing.  Today, 12/10/2023 Day 1 of acyclovir  WBC 6.2 Tmax 102.7 F Scr 0.88 today with estimated CrCl 81.4 mL/min PCT < 0.10 Blood cultures negative to date Received a one time dose of acyclovir 545 mg IV in the ED Plan is for LP tomorrow   Plan: Start acyclovir 660 mg (10 mg/kg based on AdjBW) Q8H based on current renal function  Patient also receiving vancomycin 1750 mg Q24H and ceftriaxone 2 gm Q12H F/u CSF results after LP Pharmacy will continue to monitor and dose adjust appropriately   Temp (24hrs), Avg:100.1 F (37.8 C), Min:98.6 F (37 C), Max:102.7 F (39.3 C)  Recent Labs  Lab 12/10/23 0105 12/10/23 0616  WBC 5.7 6.2  CREATININE 0.88 0.88  LATICACIDVEN 1.8  --     Estimated Creatinine Clearance: 81.4 mL/min (by C-G formula based on SCr of 0.88 mg/dL).    Allergies  Allergen Reactions   Cyanoacrylate Hives   Other Itching and Rash    Dermabond surgical glue.  Dermabond surgical glue-blisters   Sulfa Antibiotics Anaphylaxis, Rash, Shortness Of Breath and Swelling    Angioedema (also)   Sulfonamide Derivatives Hives, Shortness Of Breath and Swelling    TONGUE SWELLS   Benadryl [Diphenhydramine Hcl] Other (See Comments)    Hyperactivity    Sulfamethoxazole     Other Reaction(s): Angioedema   Diphenhydramine    Diphenhydramine Hcl     Other Reaction(s): Other (See Comments)  HYPERACTIVITY, RESTLESS LEG   Silicone Rash    Watch band   Antimicrobials this admission: 12/15  ceftriaxone >>  12/15 vancomycin >>  12/15 acyclovir >>   Dose adjustments this admission:  Microbiology results: 12/15 BCx: NG < 12 hours  Thank you for allowing pharmacy to be a part of this patient's care.  Littie Deeds, PharmD Pharmacy Resident  12/10/2023 11:29 AM

## 2023-12-10 NOTE — ED Notes (Signed)
Attempting to get manual BP, very faint sounds. Pt still mentating appropriately, however states she feels "very tired".

## 2023-12-10 NOTE — Assessment & Plan Note (Signed)
-   We will continue statin therapy. 

## 2023-12-10 NOTE — ED Notes (Signed)
Sent message to pharmacy to verify dilaudid. Pt is calling out again asking for pain meds.

## 2023-12-10 NOTE — ED Notes (Signed)
Informed Dr Renford Dills of last BP reading 82/48 taken manually. Bolus is about in so far. Awaiting further orders.

## 2023-12-10 NOTE — Assessment & Plan Note (Signed)
-   We will continue PPI therapy 

## 2023-12-10 NOTE — ED Notes (Signed)
Going to get ultrasound IV in case pt needs vasopressors.

## 2023-12-10 NOTE — Consult Note (Incomplete)
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  On review of chart, patient has had multiple admission this year with recent admission at [facility] from 11/08/23 - 11/23/23 for Mucocutaneous Rash 2/2 Disseminated HSV-1Erythema Multiforme and Sepsis 2/2 Disseminated HSV. Work up revealed + serum Abs for HSV1 and HSV2, + serum HSV PCR (was on tx ); skin biopsy with interface dermatitis c/w erythema multiforme. Dermatology, ENT, Ophthalmology, GI, oral medicine, and Infectious Disease followed while inpatient. Hard palate biopsy non-diagnostic, but negative for HSV. Pt underwent EGD with no evidence of Candida, CMV, or HSV esophagitis, although was consistent with known diagnosis of eosinophilic esophagitis. She was treated with high-dose IV acyclovir for 14 days with end of treatment 11/22/2023. At discharge she was transitioned to valacyclovir 500 mg twice daily for indefinite suppression.  Since discharge she has followed up with [facility] and immunology clinic and also had IVIG treatment on 12/08/2023.   ED Course: Initial vital signs showed HR of 120 beats/minute, BP 128/68 mm Hg, the RR 19 breaths/minute, and the oxygen saturation 97% on  RA and a temperature of 102.7 F (39.3 C)  Pertinent Labs/Diagnostics Findings: Na+/ K+/Mg:139/2.6/1.3  Glucose: 11, AST/ALT:93/78 WBC: unremarkable without bands or neutrophil predominance   PCT: negative <0.10   CXR> no active disease Medication Administered in the ED: morphine (PF) 4 MG IV vancomycin IVPB 1750 mg/350 mL, ceftriaxone 2 g , LR 1,000 Ml, Droperidol  2.5 MG/ML injection 2.5 mg, ketorolac 30 MG/ML injection 15 mg, FIORICET) 50-325-40 , potassium chloride SA CR tablet 40 mEq (40 mEq Oral X2, magnesium sulfate IVPB 2 g 50 Ml, oxymetazoline 0.05 % nasal spray 1 spray (1 spray Each Nare Disposition: Admitted to stepdown unit by hospitalist service for evaluation of sepsis secondary to suspected meningitis  Past Medical History  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  Remained hypotensive despite IV fluid bolus requiring vasopressors.  PCCM consulted  Consults:  PCCM ID  Procedures:  None  Significant Diagnostic Tests:  12/15: Chest Xray> no acute cardiopulmonary process   Interim History / Subjective:      Micro Data:  12/15: Blood culture x2>  Antimicrobials:  Vancomycin Cefepime Ceftriaxone  OBJECTIVE  Blood pressure 96/65, pulse 72, temperature 98.5 F (36.9 C), temperature source Oral, resp. rate 18, SpO2 96%.        Intake/Output Summary (Last 24 hours) at 12/10/2023 1919 Last data filed at 12/10/2023 1859 Gross per 24 hour  Intake 4846.18 ml  Output --  Net 4846.18 ml   There were no vitals filed for this visit.  Physical Examination  GENERAL:  year-old critically ill patient lying in the bed  EYES: PEERLA. No scleral icterus. Extraocular muscles intact.  HEENT: Head atraumatic, normocephalic. Oropharynx and nasopharynx clear.  NECK:  No JVD, supple  LUNGS: Normal breath sounds bilaterally.  No use  of accessory muscles of respiration.  CARDIOVASCULAR: S1, S2 normal. No murmurs, rubs, or gallops.  ABDOMEN: Soft, NTND EXTREMITIES: No swelling or erythema.  Capillary refill < 3 seconds in all extremities. Pulses palpable distally. NEUROLOGIC: The patient is . No focal neurological deficit appreciated. Cranial nerves are intact.  SKIN: No obvious rash, lesion, or ulcer. Warm to touch Labs/imaging that I havepersonally reviewed  (right click and "Reselect all SmartList Selections" daily)     Labs   CBC: Recent Labs  Lab 12/10/23 0105 12/10/23 0616  WBC 5.7 6.2  NEUTROABS 3.3  --   HGB 11.5* 10.1*  HCT 34.0* 29.9*  MCV 93.7 94.6  PLT 171 153    Basic Metabolic Panel: Recent Labs  Lab 12/10/23 0104 12/10/23 0105 12/10/23 0616 12/10/23 0958  NA  --  139  --   --   K  --  2.6*  --  3.7  CL  --  99  --   --   CO2  --  28  --   --   GLUCOSE  --  117*  --   --   BUN  --  <5*  --   --   CREATININE  --  0.88 0.88  --   CALCIUM  --  8.9  --   --   MG 1.3*  --   --   --    GFR: Estimated Creatinine Clearance: 81.4 mL/min (by C-G formula based on SCr of 0.88 mg/dL). Recent Labs  Lab 12/10/23 0105 12/10/23 0616  PROCALCITON <0.10  --   WBC 5.7 6.2  LATICACIDVEN 1.8  --     Liver Function Tests: Recent Labs  Lab 12/10/23 0105  AST 93*  ALT 78*  ALKPHOS 97  BILITOT 0.9  PROT 8.1  ALBUMIN 3.7   No results for input(s): "LIPASE", "AMYLASE" in the last 168 hours. No results for input(s): "AMMONIA" in the last 168 hours.  ABG    Component Value Date/Time   TCO2 25 04/25/2017 1429     Coagulation Profile: Recent Labs  Lab 12/10/23 0105  INR 1.2    Cardiac Enzymes: No results for input(s): "CKTOTAL", "CKMB", "CKMBINDEX", "TROPONINI" in the last 168 hours.  HbA1C: Hgb A1c MFr Bld  Date/Time Value Ref Range Status  06/06/2023 01:46 PM 5.7 (H) 4.8 - 5.6 % Final    Comment:    (NOTE) Pre diabetes:          5.7%-6.4%  Diabetes:               >6.4%  Glycemic control for   <7.0% adults with diabetes     CBG: No results for input(s): "GLUCAP" in the last 168 hours.  Review of Systems:   Review of Systems  Constitutional:  Positive for chills, fever and malaise/fatigue.  HENT: Negative.    Eyes: Negative.   Respiratory:  Negative for cough, hemoptysis, sputum production, shortness of breath and wheezing.   Cardiovascular:  Positive for leg swelling.  Gastrointestinal:  Positive for diarrhea, nausea and vomiting.  Genitourinary:  Positive for flank pain, frequency and urgency.  Musculoskeletal:  Positive for joint pain.  Skin:  Positive for itching and rash.  Psychiatric/Behavioral:  Positive for depression. The patient is nervous/anxious.     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   Surgical History    Past Surgical History:  Procedure Laterality Date  . APPENDECTOMY    . cheek biopsy  11/2020  . CHOLECYSTECTOMY    . ESOPHAGOGASTRODUODENOSCOPY N/A 04/30/2017   Procedure: ESOPHAGOGASTRODUODENOSCOPY (EGD);  Surgeon: Dorena Cookey, MD;  Location: [facility] ENDOSCOPY;  Service: Endoscopy;  Laterality: N/A;  . HERNIA REPAIR     x2  . KNEE SURGERY     x3  . MINIMALLY INVASIVE FORAMINOTOMY CERVICAL SPINE     C6-T1, Nitka  . SAVORY DILATION N/A 04/30/2017   Procedure: SAVORY DILATION;  Surgeon: Dorena Cookey, MD;  Location: [facility] ENDOSCOPY;  Service: Endoscopy;  Laterality: N/A;  . TONSILLECTOMY    . TOTAL  ABDOMINAL HYSTERECTOMY     Sarcoma, s/p XRT     Social History   reports that she has never smoked. She has been exposed to tobacco smoke. She has never used smokeless tobacco. She reports that she does not currently use alcohol. She reports current drug use.   Family History   Her family history includes Angioedema in her maternal grandfather and mother; Arrhythmia in her father; Colon cancer in an other family member; Colonic polyp in her mother; Eczema in her mother; Hashimoto's thyroiditis in her mother; Hyperlipidemia in an other family member; Hypertension in an other family member; Throat cancer in her father.   Allergies Allergies  Allergen Reactions  . Cyanoacrylate Hives  . Other Itching and Rash    Dermabond surgical glue.  Dermabond surgical glue-blisters  . Sulfa Antibiotics Anaphylaxis, Rash, Shortness Of Breath and Swelling    Angioedema (also)  . Sulfonamide Derivatives Hives, Shortness Of Breath and Swelling    TONGUE SWELLS  . Benadryl [Diphenhydramine Hcl] Other (See Comments)    Hyperactivity   . Sulfamethoxazole     Other Reaction(s): Angioedema  . Diphenhydramine   . Diphenhydramine Hcl     Other Reaction(s): Other (See Comments)  HYPERACTIVITY, RESTLESS LEG  . Silicone Rash    Watch band     Home Medications  Prior to Admission medications   Medication Sig Start Date End Date Taking? Authorizing Provider  albuterol (PROVENTIL) (2.5 MG/3ML) 0.083% nebulizer solution Take 3 mLs (2.5 mg total) by nebulization every 4 (four) hours as needed for wheezing or shortness of breath. 11/20/22  Yes Pahwani, Rinka R, MD  albuterol (VENTOLIN HFA) 108 (90 Base) MCG/ACT inhaler Inhale 1 puff into the lungs every 4 (four) hours as needed. 04/12/23  Yes [provider]  amphetamine-dextroamphetamine (ADDERALL) 20 MG tablet Take 20 mg by mouth 3 (three) times daily.   Yes [provider]  budesonide-formoterol (SYMBICORT) 80-4.5 MCG/ACT inhaler Inhale 2  puffs into the lungs 2 (two) times daily. Can also take Q4 hr as needed for wheezing 11/17/22  Yes Adhikari, Willia Craze, MD  cycloSPORINE (RESTASIS) 0.05 % ophthalmic emulsion Place 1 drop into both eyes 2 (two) times daily.   Yes [provider]  dicyclomine (BENTYL) 10 MG capsule Take 10 mg by mouth daily as needed for nausea/vomiting. 01/02/19  Yes [provider]  famotidine (PEPCID) 40 MG tablet Take 40 mg by mouth at bedtime. 05/02/21  Yes [provider]  folic acid (FOLVITE) 1 MG tablet Take 2 tablets (2 mg total) by mouth daily. Patient taking differently:  Take 1 mg by mouth daily. 10/30/19  Yes Deveshwar, Janalyn Rouse, MD  furosemide (LASIX) 20 MG tablet Take 60 mg by mouth 2 (two) times daily as needed for fluid or edema.   Yes [provider]  hydrOXYzine (ATARAX) 25 MG tablet Take 1 tablet (25 mg total) by mouth 2 (two) times daily. Patient taking differently: Take 25 mg by mouth 2 (two) times daily as needed for itching or anxiety. 09/21/23  Yes Loyce Dys, MD  lactulose (CHRONULAC) 10 GM/15ML solution Take 30 mLs (20 g total) by mouth daily as needed for mild constipation. 09/21/23  Yes Loyce Dys, MD  naloxone [facility]) nasal spray 4 mg/0.1 mL Place 1 spray into the nose once. 02/13/23  Yes [provider]  nystatin (MYCOSTATIN) 100000 UNIT/ML suspension Take 10 mLs (1,000,000 Units total) by mouth 3 (three) times daily. Patient taking differently: Take 10 mLs by mouth 3 (three) times daily as needed (Mouth sores). 12/07/20  Yes Randall Hiss, MD  nystatin powder Apply 1 application topically 2 (two) times daily as needed (rash).   Yes [provider]  ondansetron (ZOFRAN-ODT) 8 MG disintegrating tablet Take 1 tablet (8 mg total) by mouth every 8 (eight) hours as needed. 09/21/23  Yes Loyce Dys, MD  oxyCODONE (ROXICODONE) 15 MG immediate release tablet Take 15 mg by mouth every 4 (four) hours as needed. 12/04/23  Yes [provider]  pantoprazole (PROTONIX) 40 MG tablet Take 1 tablet (40 mg total) by mouth daily. Patient taking differently: Take 40 mg by mouth 2 (two) times daily. 09/21/23 09/20/24 Yes Djan, Scarlette Calico, MD  Polyethyl Glycol-Propyl Glycol (SYSTANE) 0.4-0.3 % GEL ophthalmic gel Place 1 application  into both eyes 2 (two) times daily as needed (Eye dryness).   Yes [provider]  Polyethyl Glycol-Propyl Glycol (SYSTANE) 0.4-0.3 % SOLN Apply 1 drop to eye in the morning, at noon, and at bedtime.   Yes [provider]  polyethylene glycol (MIRALAX / GLYCOLAX) 17 g packet Take 17 g by mouth daily. Patient taking differently: Take 17 g by mouth daily as needed for moderate constipation. 09/21/23  Yes Loyce Dys, MD  Potassium Chloride ER 20 MEQ TBCR Take 20 mEq by mouth daily as needed (when taking furosemide).   Yes [provider]  pravastatin (PRAVACHOL) 40 MG tablet Take 40 mg by mouth at bedtime.   Yes [provider]  prednisoLONE acetate (PRED FORTE) 1 % ophthalmic suspension Place 1 drop into the left eye as needed (conjunctivitis).   Yes [provider]  PRIVIGEN 40 GM/400ML SOLN Inject 40 g into the vein every 28 (twenty-eight) days. 11/02/23  Yes Shelba Flake, FNP  prochlorperazine (COMPAZINE) 10 MG tablet Take 1 tablet (10 mg total) by mouth every 6 (six) hours as needed for nausea or vomiting. 09/07/23  Yes Evon Slack, PA-C  promethazine (PHENERGAN) 25 MG tablet Take 1 tablet (25 mg total) by mouth every 6 (six) hours as needed for nausea or vomiting. 09/21/23  Yes Loyce Dys, MD  psyllium (HYDROCIL/METAMUCIL) 95 % PACK Take 1 packet by mouth daily. Patient taking differently: Take 1 packet by mouth daily as needed for moderate constipation. 09/21/23  Yes Loyce Dys, MD  rizatriptan (MAXALT-MLT) 10 MG disintegrating tablet Take 10 mg by mouth daily as needed for migraine.   Yes [provider]  tiZANidine (ZANAFLEX) 2 MG  tablet Take 2 mg by mouth 3 (three) times daily. 08/28/23  Yes [provider]  topiramate (TOPAMAX) 100 MG tablet Take 100 mg by mouth daily. 06/18/19  Yes [provider]  topiramate (TOPAMAX) 25 MG tablet Take 25 mg by mouth at bedtime.   Yes [provider]  triamcinolone lotion (KENALOG) 0.1 % Apply 1 application topically daily as needed (rash). 07/07/20  Yes [provider]  valACYclovir (VALTREX) 1000 MG tablet Take 1 tablet (1,000 mg total) by mouth daily. Patient taking differently: Take 500 mg by mouth 2 (two) times daily. 08/12/20  Yes Randall Hiss, MD  Scheduled Meds: . amphetamine-dextroamphetamine  20 mg Oral TID  . cycloSPORINE  1 drop Both Eyes BID  . enoxaparin (LOVENOX) injection  40 mg Subcutaneous Q24H  . [START ON 12/11/2023] folic acid  2 mg Oral Daily  . [START ON 12/11/2023] midodrine  10 mg Oral TID WC  . mometasone-formoterol  2 puff Inhalation BID  . naloxone  1 spray Nasal Once  . pantoprazole  40 mg Oral Daily  . [START ON 12/11/2023] polyethylene glycol  17 g Oral Daily  . pravastatin  40 mg Oral QHS  . [START ON 12/11/2023] topiramate  100 mg Oral Daily   Continuous Infusions: . sodium chloride 100 mL/hr at 12/10/23 1707  . sodium chloride 250 mL (12/10/23 1836)  . acyclovir (ZOVIRAX) 660 mg in dextrose 5 % 250 mL IVPB Stopped (12/10/23 2003)  . cefTRIAXone (ROCEPHIN)  IV Stopped (12/10/23 1820)  . norepinephrine (LEVOPHED) Adult infusion 1 mcg/min (12/10/23 2248)  . [START ON 12/11/2023] vancomycin     PRN Meds:.acetaminophen **OR** acetaminophen, albuterol, butalbital-acetaminophen-caffeine, dicyclomine, EPINEPHrine, HYDROmorphone (DILAUDID) injection, hydrOXYzine, magnesium hydroxide, nystatin, nystatin, ondansetron **OR** ondansetron (ZOFRAN) IV, polyvinyl alcohol, potassium chloride SA, prednisoLONE acetate, SUMAtriptan, traZODone, triamcinolone cream    Active Hospital Problem list   See systems  below  Assessment & Plan:  #Sepsis with Septic shock Secondary to Suspected IVIG Induced Aseptic Meningitis meets SIRS criteria: Heart Rate 102 beats/minute, Respiratory Rate 36 breaths/minute,Temperature 104.6, Shock Index (SI) Presenting  >36 h post IVIG infusion therapy, with headache, photophobia, nausea, vomiting and fever. Exam without neck stiffness or focal neurological signs. Blood work showed low inflammatory parameters. CTH negative. The CSF analysis pending LP -F/u cultures, trend lactic/ PCT -Monitor WBC/ fever curve -Continue ceftriaxone 2 g every 12 h will add ampicillin 2 g every 4 h. -IVF hydration as needed -Pressors for MAP goal >65 -Strict I/O's   #IgG Subclass Deficiency on IVIG #Seropositive RA Extensive Rheum/ID/Allergy history. Follows with [facility] immunology and is on IVIG therapy in the outpatient setting for IgG subclass deficiency  Last IVIG 12/08/23  RA previously on MTX but this was stopped due to an episode of aseptic meningitis.   #Mild Transaminitis  Acute elevation in AST, ALT. normal alk phos and bili. Most likely 2/2 infection/sepsis, workup otherwise neg.   #Chronic Keratoconjunctivitis  Endorsed gritty sensation affecting her vision. Ophthalmology was consulted with no concern for HSV keratitis on serial eye exams. Continued home cyclosporin eye drops. Added G[place] eye drops per ophthalmology. Home atropine eye drops were held during [place]. Resumed on discharge.   #Chronic pain Pt reports Norco 10-325 at home, which she takes up to 5x per day. She required a dilaudid PCA for severe odynophagia limiting PO intake. She was continued on her home tizanidine. She was started on gabapentin 300 mg TID for adjunct pain management.   #Migraines -Continue home topiramate.  #Lymphedema #Chronic Venous Insufficiency -Hold home Lasix was held throughout admission due  to poor PO intake and frequent need for IV fluids to maintain volume status.    #Depression #Anxiety -Resume home Zoloft.    Best practice:  Diet:  {WUJW:11914} Pain/Anxiety/Delirium protocol (if indicated): {Pain/Anxiety/Delirium:26941} VAP protocol (if indicated): {VAP:29640} DVT prophylaxis: {DVT Prophylaxis:26933} GI prophylaxis: {GI:26934} Glucose control:  {Glucose Control:26935} Central venous access:  {Central Venous Access:26936} Arterial line:  {Central Venous Access:26936} Foley:  {Central Venous Access:26936} Mobility:  {Mobility:26937}  PT consulted: {PT Consult:26938} Last date of multidisciplinary goals of care discussion [***] Code Status:  {Code Status:26939} Disposition: ***   = Goals of Care = Code Status Order: FULL  Primary Emergency Contact: Caulfield,Jason, Home Phone: [phone] Wishes to pursue full aggressive treatment and intervention options, including CPR and intubation, but goals of care will be addressed on going with family if that should become necessary.   Critical care time: 45 minutes        Webb Silversmith DNP, CCRN, FNP-C, AGACNP-BC Acute Care & Family Nurse Practitioner [facility] PCCM on call pager [phone]

## 2023-12-10 NOTE — ED Notes (Signed)
Messaged pharmacy about Nystatin powder not being verified.

## 2023-12-10 NOTE — H&P (Signed)
PATIENT NAME: [name] Horn    MR#:  [id number]  DATE OF BIRTH:  1976-03-10  DATE OF ADMISSION:  12/10/2023  PRIMARY CARE PHYSICIAN: [facility]   Patient is coming from: Home  REQUESTING/REFERRING PHYSICIAN: Delton Prairie, MD  CHIEF COMPLAINT:   Chief Complaint  Patient presents with   Fever   Headache    HISTORY OF PRESENT ILLNESS:  [name] Horn is a 47 y.o. female with medical history significant for antibody deficiency receiving IVIG regularly, rheumatoid arthritis, migraine, eosinophilic esophagitis, gastroparesis, DVT, IBD and aseptic meningitis for which she was admitted in April 2024, who presented to the emergency room with acute onset of fever and associated headache that started last night.  She had IVIG injection on Friday.  She admitted to nausea and vomiting without diarrhea.  No chest pain or dyspnea or cough or wheezing.  No dysuria, oliguria or hematuria or flank pain.  She was recently admitted to [facility] for disseminated herpes simplex 1 infection for a couple of weeks.  ED Course: When the patient came to the ER, temperature was 102.7 and heart rate 120 with otherwise normal vital signs.  Labs revealed hypokalemia of 2.6, AST of 93 and ALT 78 with otherwise unremarkable CMP.  Lactic acid was 1.8 and procalcitonin less than 0.1.  CBC showed hemoglobin 11.5 hematocrit 34 with WBCs of 5.7.  Coag profile was normal. EKG as reviewed by me : EKG showed normal sinus rhythm with rate of 96. Imaging: Portable chest x-ray showed no acute cardiopulmonary disease.  The patient was given IV Rocephin and vancomycin, 50 mg of IV Toradol, 2.5 mg of IV Inapsine, 2 p.o. Fioricet, 4 mg of IV morphine sulfate, 2 g of IV magnesium sulfate, 1 L bolus of IV lactated Ringer, 40 mill Cabbell p.o. potassium chloride for 2 doses.  Should be admitted to a medical telemetry bed for further evaluation and management. 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   Herpes labialis 08/12/2020   Hypokalemia 05/01/2023   IBD (inflammatory bowel disease)    Intertrigo 06/22/2020   Leukopenia 07/06/2021   Lymphedema    Malaise 12/30/2020   RA (rheumatoid arthritis) (HCC)    Rheumatoid arthritis (HCC) 08/12/2020   Transaminitis 08/12/2020   Uterine cancer (HCC)     PAST SURGICAL HISTORY:   Past Surgical History:  Procedure Laterality Date   APPENDECTOMY     cheek biopsy  11/2020   CHOLECYSTECTOMY     ESOPHAGOGASTRODUODENOSCOPY N/A 04/30/2017   Procedure: ESOPHAGOGASTRODUODENOSCOPY (EGD);  Surgeon: Dorena Cookey, MD;  Location: [facility] ENDOSCOPY;  Service: Endoscopy;  Laterality: N/A;   HERNIA REPAIR     x2   KNEE SURGERY     x3   MINIMALLY INVASIVE FORAMINOTOMY CERVICAL SPINE     C6-T1, Nitka   SAVORY DILATION N/A 04/30/2017   Procedure: SAVORY DILATION;  Surgeon: Dorena Cookey, MD;  Location: [facility] ENDOSCOPY;  Service: Endoscopy;  Laterality: N/A;   TONSILLECTOMY     TOTAL ABDOMINAL HYSTERECTOMY     Sarcoma, s/p XRT    SOCIAL HISTORY:   Social History  Tobacco Use   Smoking status: Never    Passive exposure: Yes   Smokeless tobacco: Never  Substance Use Topics   Alcohol use: Not Currently    FAMILY HISTORY:   Family History  Problem Relation Age of Onset   Hashimoto's thyroiditis Mother    Eczema Mother    Angioedema Mother    Colonic polyp Mother    Throat cancer Father    Arrhythmia Father    Angioedema Maternal Grandfather    Hypertension Other    Hyperlipidemia Other    Colon cancer Other        grandmother    DRUG ALLERGIES:   Allergies  Allergen Reactions   Cyanoacrylate Hives   Other Itching and Rash    Dermabond  surgical glue.  Dermabond surgical glue-blisters   Sulfa Antibiotics Anaphylaxis, Rash, Shortness Of Breath and Swelling    Angioedema (also)   Sulfonamide Derivatives Hives, Shortness Of Breath and Swelling    TONGUE SWELLS   Benadryl [Diphenhydramine Hcl] Other (See Comments)    Hyperactivity    Sulfamethoxazole     Other Reaction(s): Angioedema   Diphenhydramine    Diphenhydramine Hcl     Other Reaction(s): Other (See Comments)  HYPERACTIVITY, RESTLESS LEG   Silicone Rash    Watch band    REVIEW OF SYSTEMS:   ROS As per history of present illness. All pertinent systems were reviewed above. Constitutional, HEENT, cardiovascular, respiratory, GI, GU, musculoskeletal, neuro, psychiatric, endocrine, integumentary and hematologic systems were reviewed and are otherwise negative/unremarkable except for positive findings mentioned above in the HPI.   MEDICATIONS AT HOME:   Prior to Admission medications   Medication Sig Start Date End Date Taking? Authorizing Provider  albuterol (PROVENTIL) (2.5 MG/3ML) 0.083% nebulizer solution Take 3 mLs (2.5 mg total) by nebulization every 4 (four) hours as needed for wheezing or shortness of breath. 11/20/22   Pahwani, Kasandra Knudsen, MD  albuterol (VENTOLIN HFA) 108 (90 Base) MCG/ACT inhaler Inhale 1 puff into the lungs every 4 (four) hours as needed. 04/12/23   [provider]  amphetamine-dextroamphetamine (ADDERALL) 20 MG tablet Take 20 mg by mouth 3 (three) times daily.    [provider]  budesonide-formoterol (SYMBICORT) 80-4.5 MCG/ACT inhaler Inhale 2 puffs into the lungs 2 (two) times daily. Can also take Q4 hr as needed for wheezing 11/17/22   Burnadette Pop, MD  cycloSPORINE (RESTASIS) 0.05 % ophthalmic emulsion Place 1 drop into both eyes 2 (two) times daily.    [provider]  dicyclomine (BENTYL) 10 MG capsule Take 10 mg by mouth daily as needed for nausea/vomiting. 01/02/19   [provider]  EPINEPHrine  (EPIPEN 2-PAK) 0.3 mg/0.3 mL IJ SOAJ injection Inject 0.3 mg into the muscle as needed for anaphylaxis.    [provider]  famotidine (PEPCID) 40 MG tablet Take 40 mg by mouth at bedtime. 05/02/21   [provider]  folic acid (FOLVITE) 1 MG tablet Take 2 tablets (2 mg total) by mouth daily. 10/30/19   Pollyann Savoy, MD  furosemide (LASIX) 20 MG tablet Take 60 mg by mouth 2 (two) times daily as needed for fluid or edema.    [provider]  hydrOXYzine (ATARAX) 25 MG tablet Take 1 tablet (25 mg total) by mouth 2 (two) times daily. 09/21/23   Loyce Dys, MD  lactulose (CHRONULAC) 10 GM/15ML solution Take 30 mLs (20 g total) by mouth daily as needed for mild constipation. 09/21/23   Loyce Dys, MD  naloxone (NARCAN) nasal spray 4 mg/0.1 mL Place 1 spray into the nose once. 02/13/23   [provider]  nystatin (MYCOSTATIN) 100000 UNIT/ML suspension Take 10 mLs (1,000,000 Units total) by mouth 3 (three) times daily. 12/07/20   Randall Hiss, MD  nystatin powder Apply 1 application topically 2 (two) times daily as needed (rash).    [provider]  ondansetron (ZOFRAN-ODT) 8 MG disintegrating tablet Take 1 tablet (8 mg total) by mouth every 8 (eight) hours as needed. 09/21/23   Loyce Dys, MD  pantoprazole (PROTONIX) 40 MG tablet Take 1 tablet (40 mg total) by mouth daily. 09/21/23 09/20/24  Loyce Dys, MD  Polyethyl Glycol-Propyl Glycol (SYSTANE) 0.4-0.3 % GEL ophthalmic gel Place 1 application into both eyes in the morning and at bedtime.    [provider]  polyethylene glycol (MIRALAX / GLYCOLAX) 17 g packet Take 17 g by mouth daily. 09/21/23   Loyce Dys, MD  Potassium Chloride ER 20 MEQ TBCR Take 20 mEq by mouth daily as needed (when taking furosemide).    [provider]  pravastatin (PRAVACHOL) 40 MG tablet Take 40 mg by mouth at bedtime.    [provider]  prednisoLONE acetate (PRED FORTE) 1 % ophthalmic  suspension Place 1 drop into the left eye as needed (conjunctivitis).    [provider]  PRIVIGEN 40 GM/400ML SOLN Inject 40 g into the vein every 28 (twenty-eight) days. 11/02/23   Shelba Flake, FNP  prochlorperazine (COMPAZINE) 10 MG tablet Take 1 tablet (10 mg total) by mouth every 6 (six) hours as needed for nausea or vomiting. 09/07/23   Evon Slack, PA-C  promethazine (PHENERGAN) 25 MG tablet Take 1 tablet (25 mg total) by mouth every 6 (six) hours as needed for nausea or vomiting. 09/21/23   Loyce Dys, MD  psyllium (HYDROCIL/METAMUCIL) 95 % PACK Take 1 packet by mouth daily. 09/21/23   Loyce Dys, MD  rizatriptan (MAXALT-MLT) 10 MG disintegrating tablet Take 10 mg by mouth as directed.    [provider]  sertraline (ZOLOFT) 25 MG tablet Take 25 mg by mouth daily. 08/30/23   [provider]  tiZANidine (ZANAFLEX) 2 MG tablet Take 2 mg by mouth 3 (three) times daily. 08/28/23   [provider]  topiramate (TOPAMAX) 100 MG tablet Take 100 mg by mouth daily. 06/18/19   [provider]  topiramate (TOPAMAX) 25 MG tablet Take 25 mg by mouth at bedtime.    [provider]  triamcinolone lotion (KENALOG) 0.1 % Apply 1 application topically daily as needed (rash). 07/07/20   [provider]  valACYclovir (VALTREX) 1000 MG tablet Take 1 tablet (1,000 mg total) by mouth daily. 08/12/20   Randall Hiss, MD      VITAL SIGNS:  Blood pressure 111/71, pulse (!) 104, temperature 99.1 F (37.3 C), temperature source Oral, resp. rate 19, SpO2 95%.  PHYSICAL EXAMINATION:  Physical Exam  GENERAL:  47 y.o.-year-old patient lying in the bed with no acute distress.  EYES: Pupils equal, round, reactive to light and accommodation. No scleral icterus. Extraocular muscles intact.  HEENT: Head atraumatic, normocephalic. Oropharynx and nasopharynx clear.  NECK:  Supple, no jugular venous distention. No thyroid enlargement, no  tenderness.  LUNGS: Normal breath sounds bilaterally, no wheezing, rales,rhonchi or crepitation. No use of accessory muscles of respiration.  CARDIOVASCULAR: Regular rate and rhythm, S1, S2 normal. No murmurs, rubs, or gallops.  ABDOMEN: Soft, nondistended, nontender.  Bowel sounds present. No organomegaly or mass.  EXTREMITIES: No pedal edema, cyanosis, or clubbing.  NEUROLOGIC: Cranial nerves II through XII are intact. Muscle strength 5/5 in all extremities. Sensation intact. Gait not checked.  PSYCHIATRIC: The patient is alert and oriented x 3.  Normal affect and good eye contact. SKIN: No obvious rash, lesion, or ulcer.   LABORATORY PANEL:   CBC Recent Labs  Lab 12/10/23 0616  WBC 6.2  HGB 10.1*  HCT 29.9*  PLT 153   ------------------------------------------------------------------------------------------------------------------  Chemistries  Recent Labs  Lab 12/10/23 0104 12/10/23 0105 12/10/23 0105 12/10/23 0616  NA  --  139  --   --   K  --  2.6*  --   --   CL  --  99  --   --   CO2  --  28  --   --   GLUCOSE  --  117*  --   --   BUN  --  <5*  --   --   CREATININE  --  0.88   < > 0.88  CALCIUM  --  8.9  --   --   MG 1.3*  --   --   --   AST  --  93*  --   --   ALT  --  78*  --   --   ALKPHOS  --  97  --   --   BILITOT  --  0.9  --   --    < > = values in this interval not displayed.   ------------------------------------------------------------------------------------------------------------------  Cardiac Enzymes No results for input(s): "TROPONINI" in the last 168 hours. ------------------------------------------------------------------------------------------------------------------  RADIOLOGY:  DG Chest Port 1 View Result Date: 12/10/2023 CLINICAL DATA:  Sepsis EXAM: PORTABLE CHEST 1 VIEW COMPARISON:  08/25/2023 FINDINGS: The heart size and mediastinal contours are within normal limits. Noted. Both lungs are clear. The visualized skeletal structures are  unremarkable. The implanted loop recorder IMPRESSION: No active disease. Electronically Signed   By: Helyn Numbers M.D.   On: 12/10/2023 01:08      IMPRESSION AND PLAN:  Assessment and Plan: * Benign recurrent aseptic meningitis - The patient will be admitted to a medical telemetry bed. - We will continue antibiotic therapy with IV Rocephin and vancomycin. - We will continue her Zovirax for recent history of disseminated herpes simplex 1. - We will continue hydration with IV normal saline. - Pain management will be provided. - This could be related to her IVIG injection. - IR consult to be obtained for lumbar puncture.  She had a previous 3 dry lumbar punctures including 1 with IR.  Migraine - We will continue her Topamax and Maxalt.  GERD without esophagitis - We will continue PPI therapy  Dyslipidemia - We will continue statin therapy.  Depression - We will continue Zoloft.       DVT prophylaxis: Lovenox.  Advanced Care Planning:  Code Status: full code.  Family Communication:  The plan of care was discussed in details with the patient (and family). I answered all questions. The patient agreed to proceed with the above mentioned plan. Further management will depend upon hospital course. Disposition Plan: Back to previous home environment Consults called: Interventional radiology All the records are reviewed and case discussed with ED provider.  Status is: Inpatient    At the time of the admission, it appears that the appropriate admission status for this patient is inpatient.  This is judged to be reasonable and necessary in  order to provide the required intensity of service to ensure the patient's safety given the presenting symptoms, physical exam findings and initial radiographic and laboratory data in the context of comorbid conditions.  The patient requires inpatient status due to high intensity of service, high risk of further deterioration and high frequency of  surveillance required.  I certify that at the time of admission, it is my clinical judgment that the patient will require inpatient hospital care extending more than 2 midnights.                            Dispo: The patient is from: Home              Anticipated d/c is to: Home              Patient currently is not medically stable to d/c.              Difficult to place patient: No  Hannah Beat M.D on 12/10/2023 at 7:14 AM  [facility]   From 7 PM-7 AM, contact night-coverage [email or web address]  CC: Primary care physician; [facility]

## 2023-12-10 NOTE — ED Notes (Signed)
Due medications not yet verified by pharmacy.

## 2023-12-10 NOTE — ED Notes (Signed)
Pt trying to eat. Still nauseous.

## 2023-12-10 NOTE — ED Notes (Signed)
Called pharmacy about un-verified medications. Pharmacy to send tech to the ED to complete med rec.

## 2023-12-10 NOTE — Progress Notes (Signed)
ED Pharmacy Antimicrobial Sign Off An antibiotic consult was received from an ED provider for Acyclovir per pharmacy dosing for herpes encephalitis. A chart review was completed to assess appropriateness.   The following one time order(s) were placed:  --Acyclovir 10 mg/kg (IBW) x 1 dose --NS infusion @ 125 mL/hr x 8 hours  Further antibiotic/antimicrobial and/or pharmacy consults should be ordered by the admitting provider if indicated.   Thank you for allowing pharmacy to be a part of this patient's care.   Thank you, Otelia Sergeant, PharmD, [facility] 12/10/2023 4:57 AM

## 2023-12-10 NOTE — Significant Event (Addendum)
Got report that patients BP remains low. Started midodrine.Continue maintenance iv fluids,checking manual BP. Can give bolus if needed. We also need to minimize narcotics.Patient is asymptomatic.  Addendum: Patient BP continued to remain low.Case discussed with Dr Dgayli,critical care. Low BP suspected to be from inflammatory response to IV IG.Stopping fluids and starting on low dose pressors.She will be transferred to stepdown

## 2023-12-10 NOTE — ED Triage Notes (Signed)
Pt reports h/a and fever that onset today. Reports received IVIG on Friday. States hx of meningitis after those infusions and that the pain and symptoms today feel the same. Reports light sensitivity and pain in her neck. Pt is alert and oriented. Breathing unlabored speaking in full sentences. Symmetric chest rise and fall.

## 2023-12-11 ENCOUNTER — Inpatient Hospital Stay: Payer: Medicaid Other | Admitting: Radiology

## 2023-12-11 DIAGNOSIS — G009 Bacterial meningitis, unspecified: Secondary | ICD-10-CM | POA: Diagnosis not present

## 2023-12-11 DIAGNOSIS — G032 Benign recurrent meningitis [Mollaret]: Secondary | ICD-10-CM | POA: Diagnosis not present

## 2023-12-11 HISTORY — PX: IR LUMBAR PUNCTURE: IMG944

## 2023-12-11 LAB — PROTEIN AND GLUCOSE, CSF
Glucose, CSF: 53 mg/dL (ref 40–70)
Total  Protein, CSF: 77 mg/dL — ABNORMAL HIGH (ref 15–45)

## 2023-12-11 LAB — CBC
HCT: 27.6 % — ABNORMAL LOW (ref 36.0–46.0)
Hemoglobin: 9.1 g/dL — ABNORMAL LOW (ref 12.0–15.0)
MCH: 31.1 pg (ref 26.0–34.0)
MCHC: 33 g/dL (ref 30.0–36.0)
MCV: 94.2 fL (ref 80.0–100.0)
Platelets: 132 10*3/uL — ABNORMAL LOW (ref 150–400)
RBC: 2.93 MIL/uL — ABNORMAL LOW (ref 3.87–5.11)
RDW: 16.2 % — ABNORMAL HIGH (ref 11.5–15.5)
WBC: 4.6 10*3/uL (ref 4.0–10.5)
nRBC: 0 % (ref 0.0–0.2)

## 2023-12-11 LAB — PROCALCITONIN: Procalcitonin: <0.1 ng/mL

## 2023-12-11 LAB — CSF CELL COUNT WITH DIFFERENTIAL
Eosinophils, CSF: 0 %
Lymphs, CSF: 23 %
Monocyte-Macrophage-Spinal Fluid: 12 %
RBC Count, CSF: 2752 /mm3 — ABNORMAL HIGH (ref 0–3)
Segmented Neutrophils-CSF: 65 %
Tube #: 3
WBC, CSF: 924 /mm3 (ref 0–5)

## 2023-12-11 LAB — BASIC METABOLIC PANEL
Anion gap: 8 (ref 5–15)
BUN: <5 mg/dL — ABNORMAL LOW (ref 6–20)
CO2: 22 mmol/L (ref 22–32)
Calcium: 7.8 mg/dL — ABNORMAL LOW (ref 8.9–10.3)
Chloride: 110 mmol/L (ref 98–111)
Creatinine, Ser: 0.76 mg/dL (ref 0.44–1.00)
GFR, Estimated: >60 mL/min (ref >60)
Glucose, Bld: 115 mg/dL — ABNORMAL HIGH (ref 70–99)
Potassium: 3.6 mmol/L (ref 3.5–5.1)
Sodium: 140 mmol/L (ref 135–145)

## 2023-12-11 LAB — PROTIME-INR
INR: 1.2 (ref 0.8–1.2)
Prothrombin Time: 15.8 s — ABNORMAL HIGH (ref 11.4–15.2)

## 2023-12-11 LAB — MENINGITIS/ENCEPHALITIS PANEL (CSF)

## 2023-12-11 LAB — GLUCOSE, CAPILLARY: Glucose-Capillary: 102 mg/dL — ABNORMAL HIGH (ref 70–99)

## 2023-12-11 LAB — MAGNESIUM: Magnesium: 1.7 mg/dL (ref 1.7–2.4)

## 2023-12-11 LAB — CORTISOL-AM, BLOOD: Cortisol - AM: 0.5 ug/dL — ABNORMAL LOW (ref 6.7–22.6)

## 2023-12-11 LAB — PATHOLOGIST SMEAR REVIEW

## 2023-12-11 LAB — MRSA NEXT GEN BY PCR, NASAL: MRSA by PCR Next Gen: NOT DETECTED

## 2023-12-11 MED ORDER — HYDROMORPHONE HCL 1 MG/ML IJ SOLN
1.0000 mg | INTRAMUSCULAR | Status: DC | PRN
  Administered 2023-12-11 – 2023-12-15 (×20): 1 mg via INTRAVENOUS
  Filled 2023-12-11 (×20): qty 1

## 2023-12-11 MED ORDER — LIDOCAINE HCL 1 % IJ SOLN
6.0000 mL | Freq: Once | INTRAMUSCULAR | Status: AC
  Administered 2023-12-11: 6 mL via INTRADERMAL

## 2023-12-11 MED ORDER — CHLORHEXIDINE GLUCONATE CLOTH 2 % EX PADS
6.0000 | MEDICATED_PAD | Freq: Every day | CUTANEOUS | Status: DC
  Administered 2023-12-11 – 2023-12-14 (×4): 6 via TOPICAL

## 2023-12-11 MED ORDER — OXYCODONE HCL 5 MG PO TABS
15.0000 mg | ORAL_TABLET | ORAL | Status: DC | PRN
  Administered 2023-12-11 – 2023-12-15 (×18): 15 mg via ORAL
  Filled 2023-12-11 (×18): qty 3

## 2023-12-11 MED ORDER — VALACYCLOVIR HCL 500 MG PO TABS
500.0000 mg | ORAL_TABLET | Freq: Two times a day (BID) | ORAL | Status: DC
  Administered 2023-12-11 – 2023-12-15 (×8): 500 mg via ORAL
  Filled 2023-12-11 (×8): qty 1

## 2023-12-11 MED ORDER — PROCHLORPERAZINE EDISYLATE 10 MG/2ML IJ SOLN
10.0000 mg | Freq: Four times a day (QID) | INTRAMUSCULAR | Status: DC | PRN
  Administered 2023-12-11 – 2023-12-14 (×5): 10 mg via INTRAVENOUS
  Filled 2023-12-11 (×7): qty 2

## 2023-12-11 MED ORDER — LORAZEPAM 2 MG/ML IJ SOLN
1.0000 mg | Freq: Once | INTRAMUSCULAR | Status: AC
  Administered 2023-12-11: 1 mg via INTRAVENOUS
  Filled 2023-12-11: qty 1

## 2023-12-11 MED ORDER — ORAL CARE MOUTH RINSE: 15.0000 mL | OROMUCOSAL | Status: DC | PRN

## 2023-12-11 MED ORDER — LIDOCAINE HCL 1 % IJ SOLN
INTRAMUSCULAR | Status: AC
  Filled 2023-12-11: qty 20

## 2023-12-11 MED ORDER — MAGNESIUM SULFATE 2 GM/50ML IV SOLN
2.0000 g | Freq: Once | INTRAVENOUS | Status: AC
  Administered 2023-12-11: 2 g via INTRAVENOUS
  Filled 2023-12-11: qty 50

## 2023-12-11 NOTE — Plan of Care (Signed)

## 2023-12-11 NOTE — ED Notes (Signed)
SBAR report given to ICU RN, Nallely.

## 2023-12-11 NOTE — Consult Note (Addendum)
NAME: [name] Horn  DOB: 07/13/1976  MRN: [id number]  Date/Time: 12/11/2023 6:14 PM  REQUESTING PROVIDER: Dr.Adhikari Subjective:  REASON FOR CONSULT: Meninigits ? [name] Horn is a 47 y.o. 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 As she had a prior episode like this before she came to the ED on 12/15 midnight. Vitals in the ED  12/10/23 00:24  BP 128/68  Temp 102.7 F (39.3 C) !  Pulse Rate 120 !  Resp 19  SpO2 97 %    Latest Reference Range & Units 12/10/23 01:05  WBC 4.0 - 10.5 K/uL 5.7  Hemoglobin 12.0 - 15.0 g/dL 62.9 (L)  HCT 52.8 - 41.3 % 34.0 (L)  Platelets 150 - 400 K/uL 171  Creatinine 0.44 - 1.00 mg/dL 2.44   She was started on triple antibiotics for possible meningitis/encephalitis  I am asked to see her for the same  LP done today  Husband at bed side and he is able to give good history as well  Pt is very tired    Medical history Pt was in [facility] between 5/6-5/11 for head ache and fever. She had temp of 101 at home but in [facility] the Tmax was 100.5 once.  During that time she had LP and that showed 652 WBC ( 74% N) protein of 76 , blood glucose of 53  ME panel was negative for ( crypto, HSV, CMV, VZV, Enterovirus, HHV6, Listeria, ecoli, H influenza, human parechovirus, pneumococcus, meningococcus, GBS,) Bacterial culture and fungal culture of csf were negative blood  autoimmune workup  was negative Pt initially was on Triple antibiotics  for a day or so and it was Dc)  Ct head, CTV, MRI were all negative- she was seen by neurologist as well So it was thought the aseptic meninigits was due to IVIG which she last received  on May 3rd with underlying migraine Paraneoplastic antibodies were sent in blood and they have come back neg as well After discharge she was fine for 2 weeks On 05/19/23 she saw  immunologist and c/o headache and was given a 5 day course of  prednisone and also fluconazole for yeast infection  As per [facility] note from 5/24 "she was evaluated by Dr. Wyline Mood (A/I) and completed a thorough initial investigation with normal immunoglobulin, CH50, respiratory burst, CD19, CD3, CD4, CD8, NK cells. Negative HIV, Hep B&C, normal tetanus and diptheria titers. 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   Herpes labialis 08/12/2020   Hypokalemia 05/01/2023   IBD (inflammatory bowel disease)    Intertrigo 06/22/2020   Leukopenia 07/06/2021   Lymphedema    Malaise 12/30/2020   RA (rheumatoid arthritis) (HCC)    Rheumatoid arthritis (HCC) 08/12/2020   Transaminitis 08/12/2020   Uterine cancer [facility])     Past Surgical History:  Procedure Laterality Date   APPENDECTOMY     cheek biopsy  11/2020   CHOLECYSTECTOMY     ESOPHAGOGASTRODUODENOSCOPY N/A 04/30/2017   Procedure: ESOPHAGOGASTRODUODENOSCOPY (EGD);  Surgeon: Dorena Cookey, MD;  Location: [facility] ENDOSCOPY;  Service:  Endoscopy;  Laterality: N/A;   HERNIA REPAIR     x2   IR LUMBAR PUNCTURE  12/11/2023   KNEE SURGERY     x3   MINIMALLY INVASIVE FORAMINOTOMY CERVICAL SPINE     C6-T1, Nitka   SAVORY DILATION N/A 04/30/2017   Procedure: SAVORY DILATION;  Surgeon: Dorena Cookey, MD;  Location: [facility] ENDOSCOPY;  Service: Endoscopy;  Laterality: N/A;   TONSILLECTOMY     TOTAL ABDOMINAL HYSTERECTOMY     Sarcoma, s/p XRT    Social History   Socioeconomic History   Marital status: Married    Spouse name: Not on file   Number of children: Not on file   Years of education: Not on file   Highest education level: Not on file  Occupational History   Not on file  Tobacco Use   Smoking status: Never    Passive exposure: Yes   Smokeless tobacco: Never  Vaping Use   Vaping status: Never Used  Substance and Sexual Activity   Alcohol use: Not Currently   Drug use: Yes    Comment: prescribed oxycodone   Sexual activity: Yes    Partners: Male    Birth control/protection: None  Other Topics Concern   Not on file  Social History Narrative   Not on file   Social Drivers of Health   Financial Resource Strain: Patient Declined (11/21/2023)   Received from [facility]   Overall Financial Resource Strain (CARDIA)    Difficulty of Paying Living Expenses: Patient declined  Recent Concern: Financial Resource Strain - Medium Risk (08/26/2023)   Received from [facility]   Overall Financial Resource Strain (CARDIA)    Difficulty of Paying Living Expenses: Somewhat hard  Food Insecurity: No Food Insecurity (12/11/2023)   Hunger Vital Sign    Worried About Running Out of Food in the Last Year: Never true    Ran Out of Food in the Last Year: Never true  Transportation Needs: No Transportation Needs (12/11/2023)   PRAPARE - Administrator, Civil Service (Medical): No    Lack of Transportation (Non-Medical): No  Physical Activity: Not on file  Stress: Not on file   Social Connections: Unknown (05/06/2022)   Received from [facility]   Social Network    Social Network: Not on file  Intimate Partner Violence: Not At Risk (12/11/2023)   Humiliation, Afraid, Rape, and Kick questionnaire    Fear of Current or Ex-Partner: No    Emotionally Abused: No    Physically Abused: No    Sexually Abused: No    Family History  Problem Relation Age of Onset   Hashimoto's thyroiditis Mother    Eczema Mother    Angioedema Mother    Colonic polyp Mother    Throat cancer Father    Arrhythmia Father    Angioedema Maternal Grandfather  Hypertension Other    Hyperlipidemia Other    Colon cancer Other        grandmother   Allergies  Allergen Reactions   Cyanoacrylate Hives   Other Itching and Rash    Dermabond surgical glue.  Dermabond surgical glue-blisters   Sulfa Antibiotics Anaphylaxis, Rash, Shortness Of Breath and Swelling    Angioedema (also)   Sulfonamide Derivatives Hives, Shortness Of Breath and Swelling    TONGUE SWELLS   Benadryl [Diphenhydramine Hcl] Other (See Comments)    Hyperactivity    Sulfamethoxazole     Other Reaction(s): Angioedema   Diphenhydramine    Diphenhydramine Hcl     Other Reaction(s): Other (See Comments)  HYPERACTIVITY, RESTLESS LEG   Silicone Rash    Watch band   I? Current Facility-Administered Medications  Medication Dose Route Frequency Provider Last Rate Last Admin   0.9 %  sodium chloride infusion   Intravenous Continuous Burnadette Pop, MD 125 mL/hr at 12/11/23 1600 Infusion Verify at 12/11/23 1600   0.9 %  sodium chloride infusion  250 mL Intravenous Continuous Raechel Chute, MD 10 mL/hr at 12/11/23 1600 Infusion Verify at 12/11/23 1600   acetaminophen (TYLENOL) tablet 650 mg  650 mg Oral Q6H PRN Mansy, Jan A, MD   650 mg at 12/10/23 1350   Or   acetaminophen (TYLENOL) suppository 650 mg  650 mg Rectal Q6H PRN Mansy, Jan A, MD       acyclovir (ZOVIRAX) 660 mg in dextrose 5 % 250 mL  IVPB  660 mg Intravenous Q8H Meegan, Eryn, RPH 263.2 mL/hr at 12/11/23 1811 660 mg at 12/11/23 1811   albuterol (PROVENTIL) (2.5 MG/3ML) 0.083% nebulizer solution 2.5 mg  2.5 mg Nebulization Q4H PRN Mansy, Vernetta Honey, MD       butalbital-acetaminophen-caffeine (FIORICET) [phone] MG per tablet 1 tablet  1 tablet Oral Q6H PRN Burnadette Pop, MD   1 tablet at 12/11/23 0228   cefTRIAXone (ROCEPHIN) 2 g in sodium chloride 0.9 % 100 mL IVPB  2 g Intravenous Q12H Mansy, Jan A, MD 200 mL/hr at 12/11/23 1730 2 g at 12/11/23 1730   Chlorhexidine Gluconate Cloth 2 % PADS 6 each  6 each Topical Daily Burnadette Pop, MD   6 each at 12/11/23 0932   cycloSPORINE (RESTASIS) 0.05 % ophthalmic emulsion 1 drop  1 drop Both Eyes BID Mansy, Jan A, MD   1 drop at 12/11/23 0932   dicyclomine (BENTYL) capsule 10 mg  10 mg Oral Daily PRN Mansy, Jan A, MD       EPINEPHrine (EPI-PEN) injection 0.3 mg  0.3 mg Intramuscular PRN Mansy, Jan A, MD       folic acid (FOLVITE) tablet 2 mg  2 mg Oral Daily Mansy, Jan A, MD   2 mg at 12/11/23 0930   HYDROmorphone (DILAUDID) injection 1 mg  1 mg Intravenous Q4H PRN Noralee Stain, DO   1 mg at 12/11/23 1530   hydrOXYzine (ATARAX) tablet 25 mg  25 mg Oral Q12H PRN Burnadette Pop, MD       magnesium hydroxide (MILK OF MAGNESIA) suspension 30 mL  30 mL Oral Daily PRN Mansy, Jan A, MD       midodrine (PROAMATINE) tablet 10 mg  10 mg Oral TID WC Adhikari, Amrit, MD   10 mg at 12/11/23 1729   mometasone-formoterol (DULERA) 100-5 MCG/ACT inhaler 2 puff  2 puff Inhalation BID Mansy, Jan A, MD   2 puff at 12/10/23 2055   naloxone [facility]) nasal spray  4 mg/0.1 mL  1 spray Nasal Once Mansy, Jan A, MD       norepinephrine (LEVOPHED) 4mg  in (0.016 mg/mL) premix infusion  2-10 mcg/min Intravenous Titrated Raechel Chute, MD   Stopped at 12/11/23 1359   nystatin (MYCOSTATIN) 100000 UNIT/ML suspension 1,000,000 Units  10 mL Mouth/Throat TID PRN Mansy, Jan A, MD       nystatin (MYCOSTATIN/NYSTOP)  topical powder 1 Application  1 Application Topical BID PRN Mansy, Vernetta Honey, MD       Oral care mouth rinse  15 mL Mouth Rinse PRN Burnadette Pop, MD       oxyCODONE (Oxy IR/ROXICODONE) immediate release tablet 15 mg  15 mg Oral Q4H PRN Noralee Stain, DO   15 mg at 12/11/23 1109   pantoprazole (PROTONIX) EC tablet 40 mg  40 mg Oral Daily Mansy, Jan A, MD   40 mg at 12/11/23 0930   polyethylene glycol (MIRALAX / GLYCOLAX) packet 17 g  17 g Oral Daily Mansy, Jan A, MD   17 g at 12/11/23 0931   polyvinyl alcohol (LIQUIFILM TEARS) 1.4 % ophthalmic solution 1 drop  1 drop Both Eyes PRN Mansy, Jan A, MD       pravastatin (PRAVACHOL) tablet 40 mg  40 mg Oral QHS Mansy, Jan A, MD   40 mg at 12/10/23 2125   prednisoLONE acetate (PRED FORTE) 1 % ophthalmic suspension 1 drop  1 drop Left Eye PRN Mansy, Jan A, MD       prochlorperazine (COMPAZINE) injection 10 mg  10 mg Intravenous Q6H PRN Noralee Stain, DO   10 mg at 12/11/23 1126   SUMAtriptan (IMITREX) tablet 50 mg  50 mg Oral Q2H PRN Barrie Folk, RPH   50 mg at 12/10/23 2024   topiramate (TOPAMAX) tablet 100 mg  100 mg Oral Daily Mansy, Jan A, MD   100 mg at 12/11/23 0931   traZODone (DESYREL) tablet 25 mg  25 mg Oral QHS PRN Mansy, Jan A, MD       triamcinolone cream (KENALOG) 0.1 % cream 1 Application  1 Application Topical Daily PRN Mansy, Jan A, MD       vancomycin (VANCOCIN) IVPB 1000 mg/200 mL premix  1,000 mg Intravenous Q12H Burnadette Pop, MD         Abtx:  Anti-infectives (From admission, onward)    Start     Dose/Rate Route Frequency Ordered Stop   12/11/23 2000  vancomycin (VANCOCIN) IVPB 1000 mg/200 mL premix        1,000 mg 200 mL/hr over 60 Minutes Intravenous Every 12 hours 12/10/23 1243     12/11/23 0000  vancomycin (VANCOREADY) IVPB 1750 mg/350 mL  Status:  Discontinued        1,750 mg 175 mL/hr over 120 Minutes Intravenous Every 24 hours 12/10/23 0543 12/10/23 1243   12/10/23 1800  cefTRIAXone (ROCEPHIN) 2 g in sodium  chloride 0.9 % 100 mL IVPB        2 g 200 mL/hr over 30 Minutes Intravenous Every 12 hours 12/10/23 0526 12/24/23 0559   12/10/23 1800  acyclovir (ZOVIRAX) 660 mg in dextrose 5 % 250 mL IVPB        660 mg 263.2 mL/hr over 60 Minutes Intravenous Every 8 hours 12/10/23 1201     12/10/23 1000  valACYclovir (VALTREX) tablet 1,000 mg  Status:  Discontinued        1,000 mg Oral Daily 12/10/23 0705 12/10/23 1105   12/10/23 0900  acyclovir (  ZOVIRAX) 545 mg in dextrose 5 % 100 mL IVPB        10 mg/kg  54.7 kg (Ideal) 110.9 mL/hr over 60 Minutes Intravenous  Once 12/10/23 0453 12/10/23 1143   12/10/23 0700  acyclovir (ZOVIRAX) 545 mg in dextrose 5 % 100 mL IVPB  Status:  Discontinued        10 mg/kg  54.7 kg (Ideal) 110.9 mL/hr over 60 Minutes Intravenous  Once 12/10/23 0450 12/10/23 0453   12/10/23 0600  vancomycin (VANCOREADY) IVPB 1750 mg/350 mL  Status:  Discontinued        1,750 mg 175 mL/hr over 120 Minutes Intravenous  Once 12/10/23 0436 12/10/23 0453   12/10/23 0600  vancomycin (VANCOCIN) 1,750 mg in sodium chloride 0.9 % 500 mL IVPB        1,750 mg 258.8 mL/hr over 120 Minutes Intravenous  Once 12/10/23 0454 12/10/23 0751   12/10/23 0530  vancomycin (VANCOCIN) IVPB 1000 mg/200 mL premix  Status:  Discontinued        1,000 mg 200 mL/hr over 60 Minutes Intravenous  Once 12/10/23 0526 12/10/23 0534   12/10/23 0445  cefTRIAXone (ROCEPHIN) 2 g in sodium chloride 0.9 % 100 mL IVPB        2 g 200 mL/hr over 30 Minutes Intravenous  Once 12/10/23 0436 12/10/23 0507       REVIEW OF SYSTEMS:  Const: fever,  chills, negative weight loss Eyes: negative diplopia or visual changes, negative eye pain ENT: sores oral cavity Sore tongue Resp: negative cough, hemoptysis, dyspnea Cards: negative for chest pain, palpitations, lower extremity edema GU: negative for frequency, dysuria and hematuria GI: Negative for abdominal pain, diarrhea, bleeding, constipation Skin: excoriations back Heme:  negative for easy bruising and gum/nose bleeding MS:  weakness Neurolo headaches,  Psych:  anxiety, depression  Endocrine: negative for thyroid, diabetes Allergy/Immunology- negative for any medication or food allergies ? Pertinent Positives include : Objective:  VITALS:  BP 91/62   Pulse 87   Temp 99.4 F (37.4 C) (Oral)   Resp 12   Ht 5\' 4"  (1.626 m)   Wt 88 kg   SpO2 94%   BMI 33.30 kg/m  LDA Foley Central line Other drainage tubes PHYSICAL EXAM:  General: Alert, cooperative, no distress, appears stated age.  Head: Normocephalic, without obvious abnormality, atraumatic. Eyes: Conjunctivae clear, anicteric sclerae. Pupils are equal ENT Nares normal. No drainage or sinus tenderness. Lips, mucosa, and tongue normal. No Thrush Neck: Supple, symmetrical, no adenopathy, thyroid: non tender no carotid bruit and no JVD. Back: No CVA tenderness. Lungs: Clear to auscultation bilaterally. No Wheezing or Rhonchi. No rales. Heart: Regular rate and rhythm, no murmur, rub or gallop. Abdomen: Soft, non-tender,not distended. Bowel sounds normal. No masses Extremities: atraumatic, no cyanosis. No edema. No clubbing Skin: No rashes or lesions. Or bruising Lymph: Cervical, supraclavicular normal. Neurologic: Grossly non-focal Pertinent Labs Lab Results CBC    Component Value Date/Time   WBC 4.6 12/11/2023 0658   RBC 2.93 (L) 12/11/2023 0658   HGB 9.1 (L) 12/11/2023 0658   HGB 11.5 04/02/2020 1545   HCT 27.6 (L) 12/11/2023 0658   HCT 35.2 04/02/2020 1545   PLT 132 (L) 12/11/2023 0658   PLT 279 04/02/2020 1545   MCV 94.2 12/11/2023 0658   MCV 96 04/02/2020 1545   MCH 31.1 12/11/2023 0658   MCHC 33.0 12/11/2023 0658   RDW 16.2 (H) 12/11/2023 0658   RDW 12.8 04/02/2020 1545   LYMPHSABS 1.3 12/10/2023 0105  LYMPHSABS 2.5 04/02/2020 1545   MONOABS 0.6 12/10/2023 0105   EOSABS 0.5 12/10/2023 0105   EOSABS 0.5 (H) 04/02/2020 1545   BASOSABS 0.0 12/10/2023 0105   BASOSABS 0.1  04/02/2020 1545       Latest Ref Rng & Units 12/11/2023    6:58 AM 12/10/2023    9:58 AM 12/10/2023    6:16 AM  CMP  Glucose 70 - 99 mg/dL 086     BUN 6 - 20 mg/dL <5     Creatinine 5.78 - 1.00 mg/dL 4.69   6.29   Sodium 528 - 145 mmol/L 140     Potassium 3.5 - 5.1 mmol/L 3.6  3.7    Chloride 98 - 111 mmol/L 110     CO2 22 - 32 mmol/L 22     Calcium 8.9 - 10.3 mg/dL 7.8         Microbiology: Recent Results (from the past 240 hours)  Blood Culture (routine x 2)     Status: None (Preliminary result)   Collection Time: 12/10/23  1:05 AM   Specimen: BLOOD  Result Value Ref Range Status   Specimen Description BLOOD RIGHT ANTECUBITAL  Final   Special Requests   Final    BOTTLES DRAWN AEROBIC AND ANAEROBIC Blood Culture results may not be optimal due to an inadequate volume of blood received in culture bottles   Culture   Final    NO GROWTH 1 DAY Performed at [facility], [address] [place], [place] [address]    Report Status PENDING  Incomplete  Blood Culture (routine x 2)     Status: None (Preliminary result)   Collection Time: 12/10/23  1:05 AM   Specimen: BLOOD  Result Value Ref Range Status   Specimen Description BLOOD BLOOD RIGHT FOREARM  Final   Special Requests   Final    BOTTLES DRAWN AEROBIC AND ANAEROBIC Blood Culture adequate volume   Culture   Final    NO GROWTH 1 DAY Performed at [facility], [address], [place], [place] [address]    Report Status PENDING  Incomplete  CSF culture w Gram Stain     Status: None (Preliminary result)   Collection Time: 12/10/23 12:15 PM   Specimen: CSF; Cerebrospinal Fluid  Result Value Ref Range Status   Specimen Description CSF  Final   Special Requests CSF  Final   Gram Stain   Final    RED BLOOD CELLS WBC SEEN NO ORGANISMS SEEN Performed at [facility], [address] [place], [place] [address]    Culture PENDING  Incomplete   Report Status PENDING  Incomplete  MRSA Next  Gen by PCR, Nasal     Status: None   Collection Time: 12/11/23  2:30 AM   Specimen: Nasal Mucosa; Nasal Swab  Result Value Ref Range Status   MRSA by PCR Next Gen NOT DETECTED NOT DETECTED Final    Comment: (NOTE) The GeneXpert MRSA Assay (FDA approved for NASAL specimens only), is one component of a comprehensive MRSA colonization surveillance program. It is not intended to diagnose MRSA infection nor to guide or monitor treatment for MRSA infections. Test performance is not FDA approved in patients less than 68 years old. Performed at [facility], [address], [place], [place] [address]     Latest Reference Range & Units 12/10/23 12:15  Appearance, CSF CLEAR  HAZY !  Glucose, CSF 40 - 70 mg/dL 53  RBC Count, CSF 0 -  3 /cu mm 2,752 (H)  WBC, CSF 0 - 5 /cu mm 924 (HH)  Segmented Neutrophils-CSF % 65  Lymphs, CSF % 23  Monocyte-Macrophage-Spinal Fluid % 12  Eosinophils, CSF % 0  Color, CSF COLORLESS  PINK !  Supernatant  COLORLESS  Total  Protein, CSF 15 - 45 mg/dL 77 (H)  Tube #  3   IMAGING RESULTS: CXR no infiltrate  I have personally reviewed the films ? Impression/Recommendation ?pt presenting with fever and headache after 24 hrs of IVIG  Aseptic meningitis- benign and recurrent   Pt is on triple antibiotics being treated like bacteria meninigits and viral CSF infection CSF pleocytosis  This is 2 d episode in 6 months In May 2024 similar presentation with fever and headache and Csf analysis was negative for bacterial or viral infection by a neg ME panel. Extensive ID work up negative Paraneoplastic work up in serum was negative It was thought that her aseptic meningitis picture was due to IVIG. Extensive ID workup neg After discharge she saw her AI specialist who did not think it was due to IVIG-  Her presentation in June 2024 was similar and LP was not successful Then In Aug 2024 she had repeat LP and csf was normal  she did not get IVIG for  6  months ( May- Oct). She received a dose on 11/8 and then on  12/08/23   Await ME panel If negative will DC antibiotics and antiviral as she clinically does not fit with bacterial meningitis  Selective Immunoglobulin def- followed at [facility] normal immunoglobulin, CH50, respiratory burst, CD19, CD3, CD4, CD8, NK cells. Negative HIV, Hep B&C, normal tetanus and diptheria titers. Ms. 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 H/o migraine  Oral candidiasis  Recent HSV infection   H/o uterine sarcoma- s/p hysterectomy, radiation Will discuss with her A/I provider at [facility]  ________________________________________________ Discussed with patient, her husband in detail

## 2023-12-11 NOTE — Progress Notes (Signed)
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

## 2023-12-11 NOTE — Progress Notes (Addendum)
PROGRESS NOTE    [name] Horn  GNF:[id number] DOB: 1976-10-24 DOA: 12/10/2023 PCP: [facility]     Brief Narrative:  [name] Horn is a 47 y.o. female with medical history significant for antibody deficiency receiving IVIG monthly, rheumatoid arthritis, migraine, eosinophilic esophagitis, gastroparesis, DVT, IBD and aseptic meningitis for which she was admitted in April 2024, who presented to the emergency room with acute onset of fever and associated headache.  She was recently admitted to [facility] for disseminated herpes simplex 1 infection for a couple of weeks.  She had IVIG injection on Friday 12/13.  Early morning on Sunday 12/15, she started to have severe headaches, that was similar in presentation to her previous aseptic meningitis episode in April 2024.  Also associated nausea and vomiting as well as fevers.  Yesterday afternoon, patient became hypotensive, moved to ICU for Levophed.  CCM consulted.  New events last 24 hours / Subjective: Patient admits to headaches and nausea.  Tells me that Zofran typically does not work for her nausea, does better with Compazine or Phenergan.  Assessment & Plan:   Principal Problem:   Benign recurrent aseptic meningitis Active Problems:   Migraine   Depression   Dyslipidemia   GERD without esophagitis   Concern for recurrent aseptic meningitis with history of antibody deficiency, status post IVIG -LP 12/16 -ID consulted -On empiric vancomycin, Rocephin  Hypotension -midodrine, levophed -Appreciate CCM following  History of disseminated HSV -Zovirax -Prednisolone eyedrops  Dyslipidemia -Pravachol  Chronic pain syndrome -Uses oxycodone 15 mg every 4-6 hours  Acute urinary retention -Bladder scan revealed >999 mL.  In-N-Out cath obtained 1725ml.  Patient has urology follow-up scheduled outpatient    DVT prophylaxis: SCD   Code Status: Full Family Communication: Spouse at bedside   Disposition Plan: Home Status is: Inpatient Remains inpatient appropriate because: further work up, requiring levophed     Antimicrobials:  Anti-infectives (From admission, onward)    Start     Dose/Rate Route Frequency Ordered Stop   12/11/23 2000  vancomycin (VANCOCIN) IVPB 1000 mg/200 mL premix        1,000 mg 200 mL/hr over 60 Minutes Intravenous Every 12 hours 12/10/23 1243     12/11/23 0000  vancomycin (VANCOREADY) IVPB 1750 mg/350 mL  Status:  Discontinued        1,750 mg 175 mL/hr over 120 Minutes Intravenous Every 24 hours 12/10/23 0543 12/10/23 1243   12/10/23 1800  cefTRIAXone (ROCEPHIN) 2 g in sodium chloride 0.9 % 100 mL IVPB        2 g 200 mL/hr over 30 Minutes Intravenous Every 12 hours 12/10/23 0526 12/24/23 0559   12/10/23 1800  acyclovir (ZOVIRAX) 660 mg in dextrose 5 % 250 mL IVPB        660 mg 263.2 mL/hr over 60 Minutes Intravenous Every 8 hours 12/10/23 1201     12/10/23 1000  valACYclovir (VALTREX) tablet 1,000 mg  Status:  Discontinued        1,000 mg Oral Daily 12/10/23 0705 12/10/23 1105   12/10/23 0900  acyclovir (ZOVIRAX) 545 mg in dextrose 5 % 100 mL IVPB        10 mg/kg  54.7 kg (Ideal) 110.9 mL/hr over 60 Minutes Intravenous  Once 12/10/23 0453 12/10/23 1143   12/10/23 0700  acyclovir (ZOVIRAX) 545 mg in dextrose 5 % 100 mL IVPB  Status:  Discontinued        10  mg/kg  54.7 kg (Ideal) 110.9 mL/hr over 60  Minutes Intravenous  Once 12/10/23 0450 12/10/23 0453   12/10/23 0600  vancomycin (VANCOREADY) IVPB 1750 mg/350 mL  Status:  Discontinued        1,750 mg 175 mL/hr over 120 Minutes Intravenous  Once 12/10/23 0436 12/10/23 0453   12/10/23 0600  vancomycin (VANCOCIN) 1,750 mg in sodium chloride 0.9 % 500 mL IVPB        1,750 mg 258.8 mL/hr over 120 Minutes Intravenous  Once 12/10/23 0454 12/10/23 0751   12/10/23 0530  vancomycin (VANCOCIN) IVPB 1000 mg/200 mL premix  Status:  Discontinued        1,000 mg 200 mL/hr over 60 Minutes Intravenous  Once  12/10/23 0526 12/10/23 0534   12/10/23 0445  cefTRIAXone (ROCEPHIN) 2 g in sodium chloride 0.9 % 100 mL IVPB        2 g 200 mL/hr over 30 Minutes Intravenous  Once 12/10/23 0436 12/10/23 0507        Objective: Vitals:   12/11/23 1245 12/11/23 1300 12/11/23 1315 12/11/23 1330  BP: 120/78 121/73 122/70 99/67  Pulse: 73 78 78 78  Resp:  12 13 12   Temp:      TempSrc:      SpO2: 95% 94% 92% 92%  Weight:      Height:        Intake/Output Summary (Last 24 hours) at 12/11/2023 1337 Last data filed at 12/11/2023 1258 Gross per 24 hour  Intake 5084.54 ml  Output 1725 ml  Net 3359.54 ml   Filed Weights   12/11/23 0155  Weight: 88 kg    Examination:  General exam: Appears calm and comfortable  Respiratory system: Clear to auscultation. Respiratory effort normal. No respiratory distress. No conversational dyspnea.  Cardiovascular system: S1 & S2 heard, RRR. No murmurs. No pedal edema. Gastrointestinal system: Abdomen is nondistended, soft and nontender. Normal bowel sounds heard. Central nervous system: Alert and oriented. No focal neurological deficits. Speech clear.  Extremities: Symmetric in appearance  Skin: No rashes, lesions or ulcers on exposed skin  Psychiatry: Judgement and insight appear normal. Mood & affect appropriate.   Data Reviewed: I have personally reviewed following labs and imaging studies  CBC: Recent Labs  Lab 12/10/23 0105 12/10/23 0616 12/11/23 0658  WBC 5.7 6.2 4.6  NEUTROABS 3.3  --   --   HGB 11.5* 10.1* 9.1*  HCT 34.0* 29.9* 27.6*  MCV 93.7 94.6 94.2  PLT 171 153 132*   Basic Metabolic Panel: Recent Labs  Lab 12/10/23 0104 12/10/23 0105 12/10/23 0616 12/10/23 0958 12/11/23 0658  NA  --  139  --   --  140  K  --  2.6*  --  3.7 3.6  CL  --  99  --   --  110  CO2  --  28  --   --  22  GLUCOSE  --  117*  --   --  115*  BUN  --  <5*  --   --  <5*  CREATININE  --  0.88 0.88  --  0.76  CALCIUM  --  8.9  --   --  7.8*  MG 1.3*  --   --    --  1.7   GFR: Estimated Creatinine Clearance: 91.3 mL/min (by C-G formula based on SCr of 0.76 mg/dL). Liver Function Tests: Recent Labs  Lab 12/10/23 0105  AST 93*  ALT 78*  ALKPHOS 97  BILITOT 0.9  PROT 8.1  ALBUMIN 3.7   No results for  input(s): "LIPASE", "AMYLASE" in the last 168 hours. No results for input(s): "AMMONIA" in the last 168 hours. Coagulation Profile: Recent Labs  Lab 12/10/23 0105 12/11/23 0658  INR 1.2 1.2   Cardiac Enzymes: No results for input(s): "CKTOTAL", "CKMB", "CKMBINDEX", "TROPONINI" in the last 168 hours. BNP (last 3 results) No results for input(s): "PROBNP" in the last 8760 hours. HbA1C: No results for input(s): "HGBA1C" in the last 72 hours. CBG: Recent Labs  Lab 12/11/23 0157  GLUCAP 102*   Lipid Profile: No results for input(s): "CHOL", "HDL", "LDLCALC", "TRIG", "CHOLHDL", "LDLDIRECT" in the last 72 hours. Thyroid Function Tests: No results for input(s): "TSH", "T4TOTAL", "FREET4", "T3FREE", "THYROIDAB" in the last 72 hours. Anemia Panel: No results for input(s): "VITAMINB12", "FOLATE", "FERRITIN", "TIBC", "IRON", "RETICCTPCT" in the last 72 hours. Sepsis Labs: Recent Labs  Lab 12/10/23 0105 12/10/23 1837 12/10/23 2142 12/11/23 0658  PROCALCITON <0.10  --   --  <0.10  LATICACIDVEN 1.8 1.6 1.1  --     Recent Results (from the past 240 hours)  Blood Culture (routine x 2)     Status: None (Preliminary result)   Collection Time: 12/10/23  1:05 AM   Specimen: BLOOD  Result Value Ref Range Status   Specimen Description BLOOD RIGHT ANTECUBITAL  Final   Special Requests   Final    BOTTLES DRAWN AEROBIC AND ANAEROBIC Blood Culture results may not be optimal due to an inadequate volume of blood received in culture bottles   Culture   Final    NO GROWTH 1 DAY Performed at [facility], [address] [place], [place] [address]    Report Status PENDING  Incomplete  Blood Culture (routine x 2)     Status: None  (Preliminary result)   Collection Time: 12/10/23  1:05 AM   Specimen: BLOOD  Result Value Ref Range Status   Specimen Description BLOOD BLOOD RIGHT FOREARM  Final   Special Requests   Final    BOTTLES DRAWN AEROBIC AND ANAEROBIC Blood Culture adequate volume   Culture   Final    NO GROWTH 1 DAY Performed at [facility], [address], [place], [place] [address]    Report Status PENDING  Incomplete  CSF culture w Gram Stain     Status: None (Preliminary result)   Collection Time: 12/10/23 12:15 PM   Specimen: CSF; Cerebrospinal Fluid  Result Value Ref Range Status   Specimen Description CSF  Final   Special Requests CSF  Final   Gram Stain   Final    RED BLOOD CELLS WBC SEEN NO ORGANISMS SEEN Performed at [facility], [address] [place], [place] [address]    Culture PENDING  Incomplete   Report Status PENDING  Incomplete  MRSA Next Gen by PCR, Nasal     Status: None   Collection Time: 12/11/23  2:30 AM   Specimen: Nasal Mucosa; Nasal Swab  Result Value Ref Range Status   MRSA by PCR Next Gen NOT DETECTED NOT DETECTED Final    Comment: (NOTE) The GeneXpert MRSA Assay (FDA approved for NASAL specimens only), is one component of a comprehensive MRSA colonization surveillance program. It is not intended to diagnose MRSA infection nor to guide or monitor treatment for MRSA infections. Test performance is not FDA approved in patients less than 43 years old. Performed at [facility], [address] [place], [place] [address]       Radiology Studies: IR LUMBAR PUNCTURE Result Date: 12/11/2023 CLINICAL  DATA:  Sepsis and suspected IV IG induced aseptic meningitis. Rheumatoid arthritis. EXAM: DIAGNOSTIC LUMBAR PUNCTURE UNDER FLUOROSCOPIC GUIDANCE COMPARISON:  None Available. FLUOROSCOPY: Radiation Exposure Index (as provided by the fluoroscopic device): 9.0 mGy Kerma PROCEDURE: Informed consent was obtained from the patient prior to the  procedure, including potential complications of headache, allergy, and pain. With the patient prone, the lower back was prepped with ChloraPrep. 1% Lidocaine was used for local anesthesia. Lumbar puncture was performed at the L4-5 level using a 20 gauge needle with return of slightly pink tinged CSF. 10 cc ml of CSF were obtained for laboratory studies. The patient tolerated the procedure well and there were no apparent complications. IMPRESSION: Successful L4-5 fluoroscopic lumbar puncture. Electronically Signed   By: Judie Petit.  Shick M.D.   On: 12/11/2023 13:24   DG Chest Port 1 View Result Date: 12/10/2023 CLINICAL DATA:  Sepsis EXAM: PORTABLE CHEST 1 VIEW COMPARISON:  08/25/2023 FINDINGS: The heart size and mediastinal contours are within normal limits. Noted. Both lungs are clear. The visualized skeletal structures are unremarkable. The implanted loop recorder IMPRESSION: No active disease. Electronically Signed   By: Helyn Numbers M.D.   On: 12/10/2023 01:08      Scheduled Meds:  amphetamine-dextroamphetamine  20 mg Oral TID   Chlorhexidine Gluconate Cloth  6 each Topical Daily   cycloSPORINE  1 drop Both Eyes BID   folic acid  2 mg Oral Daily   midodrine  10 mg Oral TID WC   mometasone-formoterol  2 puff Inhalation BID   naloxone  1 spray Nasal Once   pantoprazole  40 mg Oral Daily   polyethylene glycol  17 g Oral Daily   pravastatin  40 mg Oral QHS   topiramate  100 mg Oral Daily   Continuous Infusions:  sodium chloride 125 mL/hr at 12/11/23 1200   sodium chloride 10 mL/hr at 12/11/23 1200   acyclovir (ZOVIRAX) 660 mg in dextrose 5 % 250 mL IVPB 263 mL/hr at 12/11/23 1200   cefTRIAXone (ROCEPHIN)  IV Stopped (12/11/23 0557)   norepinephrine (LEVOPHED) Adult infusion 2 mcg/min (12/11/23 1200)   vancomycin       LOS: 1 day   Time spent: 45 minutes   Noralee Stain, DO [facility] 12/11/2023, 1:37 PM   Available via Epic secure chat 7am-7pm After these hours, please refer  to coverage provider listed on [email or web address]

## 2023-12-11 NOTE — Plan of Care (Signed)
  Problem: Fluid Volume: Goal: Hemodynamic stability will improve Outcome: Progressing   

## 2023-12-11 NOTE — Progress Notes (Signed)
NAME:  [name] Horn, MRN:  [id number], DOB:  [birth year], LOS: 1 ADMISSION DATE:  12/10/2023, CONSULTATION DATE:  12/10/23 REFERRING MD: Lorretta Harp, REASON FOR CONSULT:  Hypotension   HPI  47 y.o  female with complicated PMH including Malignant Uterine Sarcoma s/p total abdominal hysterectomy (TAH), urinary retention, chronic pyelonephritis, NSAT with a loop recorder, EOE, chronic cutaneous candidiasis, seropositive RA, tracheobronchomalacia, IgG subclass deficiency, keratoconjunctivitis sicca, and osteoarthritis  who presented to the ED with chief complaints of recurrent fevers post IVIG.  On review of chart, patient has had multiple admission this year with recent admission at [facility] from 11/08/23 - 11/23/23 for Mucocutaneous Rash 2/2 Disseminated HSV-1Erythema Multiforme and Sepsis 2/2 Disseminated HSV. Work up revealed + serum Abs for HSV1 and HSV2, + serum HSV PCR (was on tx ); skin biopsy with interface dermatitis c/w erythema multiforme. Dermatology, ENT, Ophthalmology, GI, oral medicine, and Infectious Disease followed while inpatient. Hard palate biopsy non-diagnostic, but negative for HSV. Pt underwent EGD with no evidence of Candida, CMV, or HSV esophagitis, although was consistent with known diagnosis of eosinophilic esophagitis. She was treated with high-dose IV acyclovir for 14 days with end of treatment 11/22/2023. At discharge she was transitioned to valacyclovir 500 mg twice daily for indefinite suppression.  Since discharge she has followed up with [facility] immunology clinic and also had IVIG treatment on 12/08/2023.   ED Course: Initial vital signs showed HR of 120 beats/minute, BP 128/68 mm Hg, the RR 19 breaths/minute, and the oxygen saturation 97% on  RA and a temperature of 102.7 F (39.3 C)  Pertinent Labs/Diagnostics Findings: Na+/ K+/Mg:139/2.6/1.3  Glucose: 11, AST/ALT:93/78 WBC: unremarkable without bands or neutrophil predominance   PCT: negative <0.10   CXR> no active disease Medication Administered in the ED: morphine (PF) 4 MG IV vancomycin IVPB 1750 mg/350 mL, ceftriaxone 2 g , LR 1,000 Ml, Droperidol  2.5 MG/ML injection 2.5 mg, ketorolac 30 MG/ML injection 15 mg, FIORICET) 50-325-40 , potassium chloride SA CR tablet 40 mEq (40 mEq Oral X2, magnesium sulfate IVPB 2 g 50 Ml, oxymetazoline 0.05 % nasal spray 1 spray (1 spray Each Nare Disposition: Admitted to stepdown unit by hospitalist service for evaluation of sepsis secondary to suspected meningitis  Past Medical History  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  Remained hypotensive despite IV fluid bolus requiring vasopressors.  PCCM consulted 12/11/23- patient is s/p LP with some blood in tap but definitely abnormal and is conistent with atypical meningitis.   Consults:  PCCM ID  Procedures:  None  Significant Diagnostic Tests:  12/15: Chest Xray> no acute cardiopulmonary process   Interim History / Subjective:      Micro Data:  12/15: Blood culture x2>  Antimicrobials:  Vancomycin 12/15> Ceftriaxone 12/15>  OBJECTIVE  Blood pressure 100/67, pulse 78, temperature 99.4 F (37.4 C), temperature source Oral, resp. rate 15, height 5\' 4"  (1.626 m), weight 88 kg, SpO2 95%.        Intake/Output Summary (Last 24 hours) at 12/11/2023 1058 Last data filed at 12/11/2023 1000 Gross per 24 hour  Intake 5170.77 ml  Output 0 ml  Net 5170.77 ml   Filed Weights   12/11/23 0155  Weight: 88 kg  Physical Examination  GENERAL: 47 year-old critically ill patient lying in the bed  EYES: PEERLA. No scleral icterus.  Extraocular muscles intact.  HEENT: Head atraumatic, normocephalic. Oropharynx and nasopharynx with multiple healing vesicles on the hard palate  NECK:  No JVD, supple  LUNGS: Normal breath sounds bilaterally.  No use of accessory muscles of respiration.  CARDIOVASCULAR: S1, S2 normal. No murmurs, rubs, or gallops.  ABDOMEN: Soft, NTND EXTREMITIES: Dependent edema of lower legs NEUROLOGIC: No focal neurological deficit appreciated. Cranial nerves are intact.  SKIN: Well-healing lesions from prior HSV vesicles (neck, upper arms)  Labs/imaging that I havepersonally reviewed  (right click and "Reselect all SmartList Selections" daily)     Labs   CBC: Recent Labs  Lab 12/10/23 0105 12/10/23 0616 12/11/23 0658  WBC 5.7 6.2 4.6  NEUTROABS 3.3  --   --   HGB 11.5* 10.1* 9.1*  HCT 34.0* 29.9* 27.6*  MCV 93.7 94.6 94.2  PLT 171 153 132*    Basic Metabolic Panel: Recent Labs  Lab 12/10/23 0104 12/10/23 0105 12/10/23 0616 12/10/23 0958 12/11/23 0658  NA  --  139  --   --  140  K  --  2.6*  --  3.7 3.6  CL  --  99  --   --  110  CO2  --  28  --   --  22  GLUCOSE  --  117*  --   --  115*  BUN  --  <5*  --   --  <5*  CREATININE  --  0.88 0.88  --  0.76  CALCIUM  --  8.9  --   --  7.8*  MG 1.3*  --   --   --  1.7   GFR: Estimated Creatinine Clearance: 91.3 mL/min (by C-G formula based on SCr of 0.76 mg/dL). Recent Labs  Lab 12/10/23 0105 12/10/23 0616 12/10/23 1837 12/10/23 2142 12/11/23 0658  PROCALCITON <0.10  --   --   --  <0.10  WBC 5.7 6.2  --   --  4.6  LATICACIDVEN 1.8  --  1.6 1.1  --     Liver Function Tests: Recent Labs  Lab 12/10/23 0105  AST 93*  ALT 78*  ALKPHOS 97  BILITOT 0.9  PROT 8.1  ALBUMIN 3.7   No results for input(s): "LIPASE", "AMYLASE" in the last 168 hours. No results for input(s): "AMMONIA" in the last 168 hours.  ABG    Component Value Date/Time   TCO2 25 04/25/2017 1429     Coagulation Profile: Recent Labs  Lab  12/10/23 0105 12/11/23 0658  INR 1.2 1.2    Cardiac Enzymes: No results for input(s): "CKTOTAL", "CKMB", "CKMBINDEX", "TROPONINI" in the last 168 hours.  HbA1C: Hgb A1c MFr Bld  Date/Time Value Ref Range Status  06/06/2023 01:46 PM 5.7 (H) 4.8 - 5.6 % Final    Comment:    (NOTE) Pre diabetes:          5.7%-6.4%  Diabetes:              >6.4%  Glycemic control for   <7.0% adults with diabetes     CBG: Recent Labs  Lab 12/11/23 0157  GLUCAP 102*    Review of Systems:    10 point ROS negative except as per HPI  Past Medical History  She,  has a past medical history of Cellulitis, leg (09/14/2021), Complication of anesthesia, COVID-19 (07/06/2021), DVT (deep venous thrombosis) (HCC), Fatigue (12/30/2020), Fever (10/05/2020),  Gastroparesis (08/07/2019), 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   Surgical History    Past Surgical History:  Procedure Laterality Date   APPENDECTOMY     cheek biopsy  11/2020   CHOLECYSTECTOMY     ESOPHAGOGASTRODUODENOSCOPY N/A 04/30/2017   Procedure: ESOPHAGOGASTRODUODENOSCOPY (EGD);  Surgeon: Dorena Cookey, MD;  Location: [facility] ENDOSCOPY;  Service: Endoscopy;  Laterality: N/A;   HERNIA REPAIR     x2   KNEE SURGERY     x3   MINIMALLY INVASIVE FORAMINOTOMY CERVICAL SPINE     C6-T1, Nitka   SAVORY DILATION N/A 04/30/2017   Procedure: SAVORY DILATION;  Surgeon: Dorena Cookey, MD;  Location: [facility] ENDOSCOPY;  Service: Endoscopy;  Laterality: N/A;   TONSILLECTOMY     TOTAL ABDOMINAL HYSTERECTOMY     Sarcoma, s/p XRT   Social History   reports that she has never smoked. She has been exposed to tobacco smoke. She has never used smokeless tobacco. She reports that she  does not currently use alcohol. She reports current drug use.   Family History   Her family history includes Angioedema in her maternal grandfather and mother; Arrhythmia in her father; Colon cancer in an other family member; Colonic polyp in her mother; Eczema in her mother; Hashimoto's thyroiditis in her mother; Hyperlipidemia in an other family member; Hypertension in an other family member; Throat cancer in her father.   Allergies Allergies  Allergen Reactions   Cyanoacrylate Hives   Other Itching and Rash    Dermabond surgical glue.  Dermabond surgical glue-blisters   Sulfa Antibiotics Anaphylaxis, Rash, Shortness Of Breath and Swelling    Angioedema (also)   Sulfonamide Derivatives Hives, Shortness Of Breath and Swelling    TONGUE SWELLS   Benadryl [Diphenhydramine Hcl] Other (See Comments)    Hyperactivity    Sulfamethoxazole     Other Reaction(s): Angioedema   Diphenhydramine    Diphenhydramine Hcl     Other Reaction(s): Other (See Comments)  HYPERACTIVITY, RESTLESS LEG   Silicone Rash    Watch band     Home Medications  Prior to Admission medications   Medication Sig Start Date End Date Taking? Authorizing Provider  albuterol (PROVENTIL) (2.5 MG/3ML) 0.083% nebulizer solution Take 3 mLs (2.5 mg total) by nebulization every 4 (four) hours as needed for wheezing or shortness of breath. 11/20/22  Yes Pahwani, Rinka R, MD  albuterol (VENTOLIN HFA) 108 (90 Base) MCG/ACT inhaler Inhale 1 puff into the lungs every 4 (four) hours as needed. 04/12/23  Yes [provider]  amphetamine-dextroamphetamine (ADDERALL) 20 MG tablet Take 20 mg by mouth 3 (three) times daily.   Yes [provider]  budesonide-formoterol (SYMBICORT) 80-4.5 MCG/ACT inhaler Inhale 2 puffs into the lungs 2 (two) times daily. Can also take Q4 hr as needed for wheezing 11/17/22  Yes Adhikari, Willia Craze, MD  cycloSPORINE (RESTASIS) 0.05 % ophthalmic emulsion Place 1 drop into both eyes 2 (two)  times daily.   Yes [provider]  dicyclomine (BENTYL) 10 MG capsule Take 10 mg by mouth daily as needed for nausea/vomiting. 01/02/19  Yes [provider]  famotidine (PEPCID) 40 MG tablet Take 40 mg by mouth at bedtime. 05/02/21  Yes [provider]  folic acid (FOLVITE) 1 MG tablet Take 2 tablets (2 mg total) by mouth daily. Patient taking differently: Take 1 mg by mouth daily.  10/30/19  Yes Deveshwar, Janalyn Rouse, MD  furosemide (LASIX) 20 MG tablet Take 60 mg by mouth 2 (two) times daily as needed for fluid or edema.   Yes [provider]  hydrOXYzine (ATARAX) 25 MG tablet Take 1 tablet (25 mg total) by mouth 2 (two) times daily. Patient taking differently: Take 25 mg by mouth 2 (two) times daily as needed for itching or anxiety. 09/21/23  Yes Loyce Dys, MD  lactulose (CHRONULAC) 10 GM/15ML solution Take 30 mLs (20 g total) by mouth daily as needed for mild constipation. 09/21/23  Yes Loyce Dys, MD  naloxone [facility]) nasal spray 4 mg/0.1 mL Place 1 spray into the nose once. 02/13/23  Yes [provider]  nystatin (MYCOSTATIN) 100000 UNIT/ML suspension Take 10 mLs (1,000,000 Units total) by mouth 3 (three) times daily. Patient taking differently: Take 10 mLs by mouth 3 (three) times daily as needed (Mouth sores). 12/07/20  Yes Randall Hiss, MD  nystatin powder Apply 1 application topically 2 (two) times daily as needed (rash).   Yes [provider]  ondansetron (ZOFRAN-ODT) 8 MG disintegrating tablet Take 1 tablet (8 mg total) by mouth every 8 (eight) hours as needed. 09/21/23  Yes Loyce Dys, MD  oxyCODONE (ROXICODONE) 15 MG immediate release tablet Take 15 mg by mouth every 4 (four) hours as needed. 12/04/23  Yes [provider]  pantoprazole (PROTONIX) 40 MG tablet Take 1 tablet (40 mg total) by mouth daily. Patient taking differently: Take 40 mg by mouth 2 (two) times daily. 09/21/23 09/20/24 Yes Djan, Scarlette Calico, MD   Polyethyl Glycol-Propyl Glycol (SYSTANE) 0.4-0.3 % GEL ophthalmic gel Place 1 application  into both eyes 2 (two) times daily as needed (Eye dryness).   Yes [provider]  Polyethyl Glycol-Propyl Glycol (SYSTANE) 0.4-0.3 % SOLN Apply 1 drop to eye in the morning, at noon, and at bedtime.   Yes [provider]  polyethylene glycol (MIRALAX / GLYCOLAX) 17 g packet Take 17 g by mouth daily. Patient taking differently: Take 17 g by mouth daily as needed for moderate constipation. 09/21/23  Yes Loyce Dys, MD  Potassium Chloride ER 20 MEQ TBCR Take 20 mEq by mouth daily as needed (when taking furosemide).   Yes [provider]  pravastatin (PRAVACHOL) 40 MG tablet Take 40 mg by mouth at bedtime.   Yes [provider]  prednisoLONE acetate (PRED FORTE) 1 % ophthalmic suspension Place 1 drop into the left eye as needed (conjunctivitis).   Yes [provider]  PRIVIGEN 40 GM/400ML SOLN Inject 40 g into the vein every 28 (twenty-eight) days. 11/02/23  Yes Shelba Flake, FNP  prochlorperazine (COMPAZINE) 10 MG tablet Take 1 tablet (10 mg total) by mouth every 6 (six) hours as needed for nausea or vomiting. 09/07/23  Yes Evon Slack, PA-C  promethazine (PHENERGAN) 25 MG tablet Take 1 tablet (25 mg total) by mouth every 6 (six) hours as needed for nausea or vomiting. 09/21/23  Yes Loyce Dys, MD  psyllium (HYDROCIL/METAMUCIL) 95 % PACK Take 1 packet by mouth daily. Patient taking differently: Take 1 packet by mouth daily as needed for moderate constipation. 09/21/23  Yes Loyce Dys, MD  rizatriptan (MAXALT-MLT) 10 MG disintegrating tablet Take 10 mg by mouth daily as needed for migraine.   Yes [provider]  tiZANidine (ZANAFLEX) 2 MG tablet Take 2 mg by mouth 3 (three) times daily. 08/28/23  Yes [provider]  topiramate (TOPAMAX)  100 MG tablet Take 100 mg by mouth daily. 06/18/19  Yes [provider]  topiramate  (TOPAMAX) 25 MG tablet Take 25 mg by mouth at bedtime.   Yes [provider]  triamcinolone lotion (KENALOG) 0.1 % Apply 1 application topically daily as needed (rash). 07/07/20  Yes [provider]  valACYclovir (VALTREX) 1000 MG tablet Take 1 tablet (1,000 mg total) by mouth daily. Patient taking differently: Take 500 mg by mouth 2 (two) times daily. 08/12/20  Yes Daiva Eves, Lisette Grinder, MD  Scheduled Meds:  amphetamine-dextroamphetamine  20 mg Oral TID   Chlorhexidine Gluconate Cloth  6 each Topical Daily   cycloSPORINE  1 drop Both Eyes BID   folic acid  2 mg Oral Daily   midodrine  10 mg Oral TID WC   mometasone-formoterol  2 puff Inhalation BID   naloxone  1 spray Nasal Once   pantoprazole  40 mg Oral Daily   polyethylene glycol  17 g Oral Daily   pravastatin  40 mg Oral QHS   topiramate  100 mg Oral Daily   Continuous Infusions:  sodium chloride 125 mL/hr at 12/11/23 1000   sodium chloride 10 mL/hr at 12/11/23 1000   acyclovir (ZOVIRAX) 660 mg in dextrose 5 % 250 mL IVPB Stopped (12/11/23 0250)   cefTRIAXone (ROCEPHIN)  IV Stopped (12/11/23 0557)   norepinephrine (LEVOPHED) Adult infusion 2 mcg/min (12/11/23 1000)   vancomycin     PRN Meds:.acetaminophen **OR** acetaminophen, albuterol, butalbital-acetaminophen-caffeine, dicyclomine, EPINEPHrine, HYDROmorphone (DILAUDID) injection, hydrOXYzine, magnesium hydroxide, nystatin, nystatin, mouth rinse, oxyCODONE, polyvinyl alcohol, prednisoLONE acetate, prochlorperazine, SUMAtriptan, traZODone, triamcinolone cream  Active Hospital Problem list   See systems below  Assessment & Plan:  #Sepsis Secondary Aseptic Meningitis -ID Consult- appreciate input  -continue current scope of therapy    #IgG Subclass Deficiency on IVIG #Seropositive RA Extensive Rheum/ID/Allergy hx. Follows with [facility] immunology and is on IVIG therapy in for IgG subclass deficiency  -Last IVIG 12/08/23  -RA previously on MTX but this was  stopped due to an episode of aseptic meningitis.   #Mild Transaminitis  Acute elevation in AST, ALT. normal alk phos and bili. Most likely 2/2 infection/sepsis. 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  -Continue Acyclovir IV -Ophthalmology at [facility] evaluated for with no concern for HSV keratitis.. -Continued home cyclosporin eye drops and Genteal eye drops per ophthalmology.   #Chronic pain -On Oxycodone at home -continued home tizanidine and PRN IV meds for adjunct pain management.   #Migraines -Continue home topiramate, Fioricet and triptan  #Lymphedema #Chronic Venous Insufficiency -Hold home Lasix   #Depression #Anxiety -Trazodone and Hydroxyzine    Best practice:  Diet:  Oral Pain/Anxiety/Delirium protocol (if indicated): No VAP protocol (if indicated): Not indicated DVT prophylaxis: LMWH GI prophylaxis: N/A Glucose control:  SSI No Central venous access:  N/A Arterial line:  N/A Foley:  N/A Mobility:  bed rest  PT consulted: N/A Last date of multidisciplinary goals of care discussion [12/15] Code Status:  full code Disposition: Stepdown   = Goals of Care = Code Status Order: FULL  Primary Emergency Contact: Quinlivan,Jason, Home Phone: [phone] Wishes to pursue full aggressive treatment and intervention options, including CPR and intubation, but goals of care will  be addressed on going with family if that should become necessary.   Critical care provider statement:   Total critical care time: 33 minutes   Performed by: Karna Christmas MD   Critical care time was exclusive of separately billable  procedures and treating other patients.   Critical care was necessary to treat or prevent imminent or life-threatening deterioration.   Critical care was time spent personally by me on the following activities: development of treatment plan with patient and/or surrogate as well as nursing, discussions with consultants, evaluation of patient's response to treatment, examination of patient, obtaining history from patient or surrogate, ordering and performing treatments and interventions, ordering and review of laboratory studies, ordering and review of radiographic studies, pulse oximetry and re-evaluation of patient's condition.    Vida Rigger, M.D.  Pulmonary & Critical Care Medicine

## 2023-12-11 NOTE — Procedures (Signed)
Interventional Radiology Procedure Note  Procedure: FLUORO LP AT L4-5     Complications: None  Estimated Blood Loss:  0  Findings: 10CC PINK TINGED CSF    Sharen Counter, MD

## 2023-12-11 NOTE — TOC Initial Note (Signed)
Transition of Care [facility]) - Initial/Assessment Note    Patient Details  Name: [name] Horn MRN: [id number] Date of Birth: Jul 31, 1976  Transition of Care [facility]) CM/SW Contact:    Liliana Cline, LCSW Phone Number: 12/11/2023, 11:57 AM  Clinical Narrative:                 Patient is off the floor. CSW spoke with patient's spouse Barbara Cower for readmission risk assessment. Patient is from home with spouse. PCP is [facility]. Pharmacy is [facility] on [address]. No HH or SNF history. Patient has leg wraps and a nebulizer at home.  TOC will follow for needs.  Expected Discharge Plan: Home/Self Care Barriers to Discharge: Continued Medical Work up   Patient Goals and CMS Choice   CMS Medicare.gov Compare Post Acute Care list provided to:: Patient Represenative (must comment) Choice offered to / list presented to : Spouse      Expected Discharge Plan and Services       Living arrangements for the past 2 months: Single Family Home                                      Prior Living Arrangements/Services Living arrangements for the past 2 months: Single Family Home Lives with:: Spouse Patient language and need for interpreter reviewed:: Yes Do you feel safe going back to the place where you live?: Yes      Need for Family Participation in Patient Care: Yes (Comment) Care giver support system in place?: Yes (comment) Current home services: DME Criminal Activity/Legal Involvement Pertinent to Current Situation/Hospitalization: No - Comment as needed  Activities of Daily Living   ADL Screening (condition at time of admission) Independently performs ADLs?: No Does the patient have a NEW difficulty with bathing/dressing/toileting/self-feeding that is expected to last >3 days?: No Does the patient have a NEW difficulty with getting in/out of bed, walking, or climbing stairs that is expected to last >3 days?: No Does the patient have a NEW difficulty with communication  that is expected to last >3 days?: No Is the patient deaf or have difficulty hearing?: No Does the patient have difficulty seeing, even when wearing glasses/contacts?: No Does the patient have difficulty concentrating, remembering, or making decisions?: No  Permission Sought/Granted                  Emotional Assessment         Alcohol / Substance Use: Not Applicable Psych Involvement: No (comment)  Admission diagnosis:  Hypokalemia [E87.6] Benign recurrent aseptic meningitis [G03.2] Bad headache [R51.9] Patient Active Problem List   Diagnosis Date Noted   Benign recurrent aseptic meningitis 12/10/2023   Depression 12/10/2023   Dyslipidemia 12/10/2023   GERD without esophagitis 12/10/2023   SIRS (systemic inflammatory response syndrome) (HCC) 08/25/2023   UTI (urinary tract infection) 08/25/2023   Recurrent headache 06/06/2023   Aseptic meningitis 05/01/2023   Sepsis (HCC) 05/01/2023   Acute bronchitis 11/11/2022   HLD (hyperlipidemia) 11/11/2022   Lymphedema 11/11/2022   Obesity with body mass index (BMI) of 30.0 to 39.9 11/11/2022   RA (rheumatoid arthritis) (HCC) 11/11/2022   Migraine 11/11/2022   Cardiac arrhythmia_nonsustained V. 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   Chronic rhinitis  04/23/2020   Adverse food reaction 04/23/2020   Eosinophilic esophagitis 04/03/2020   Oral candidiasis 04/02/2020   History of Clostridium difficile infection 04/02/2020   History of shingles 04/02/2020   History of asthma 04/02/2020   Multiple drug allergies 04/02/2020   Environmental allergies 04/02/2020   History of loop recorder 01/19/2020   PVC (premature ventricular contraction) 01/19/2020   Uterine cancer (HCC) 01/19/2020   Acute colitis 08/14/2019   Constipation    Gastroparesis 04/03/2019   Palpitations 04/10/2018   Nausea and vomiting 04/26/2017   Nausea & vomiting 04/25/2017   Chest pain 04/25/2017   Enterocolitis due to Clostridium difficile 01/04/2016   Incisional hernia without obstruction or gangrene 11/18/2015   Personal history of other diseases of the nervous system and sense organs 10/14/2015   Disease of pancreas 01/21/2015   Other specified bacterial agents as the cause of diseases classified elsewhere 01/21/2015   Radiculopathy of cervical region 12/09/2014   Brachial neuritis 12/09/2014   Cervical radicular pain 12/09/2014   Annual physical exam 01/30/2012   Melena 01/30/2012   Pain in ankle 11/24/2010   Anxiety state 10/11/2010   Acute thromboembolism of deep veins of lower extremity (HCC) 10/11/2010   Generalized anxiety disorder 10/11/2010   COSTOCHONDRITIS 03/03/2008   Cellulitis of toe 12/01/2007   MENOPAUSE, PREMATURE 07/20/2007   UTERINE CANCER, HX OF 07/16/2007   Other postprocedural status(V45.89) 07/16/2007   PCP:  [facility]:   [id number] [place], [place] - [address] AT [facility] [address] [place] [address] Phone: [phone] Fax: [phone]  [place] [facility] [address] [address] Phone: [phone] Fax: [phone]     Social Drivers of Health (SDOH) Social History: SDOH Screenings   Food  Insecurity: No Food Insecurity (12/11/2023)  Housing: High Risk (12/11/2023)  Transportation Needs: No Transportation Needs (12/11/2023)  Utilities: Not At Risk (12/11/2023)  Depression (PHQ2-9): Low Risk  (07/06/2021)  Financial Resource Strain: Patient Declined (11/21/2023)   Received from [facility]  Recent Concern: Financial Resource Strain - Medium Risk (08/26/2023)   Received from [facility]  Social Connections: Unknown (05/06/2022)   Received from [facility]  Tobacco Use: Medium Risk (12/10/2023)   SDOH Interventions:     Readmission Risk Interventions    12/11/2023   11:55 AM 09/11/2023   12:13 PM 11/13/2022   10:26 AM  Readmission Risk Prevention Plan  Transportation Screening Complete Complete Complete  PCP or Specialist Appt within 3-5 Days   Complete  HRI or Home Care Consult   Complete  Social Work Consult for Recovery Care Planning/Counseling   Complete  Palliative Care Screening   Not Applicable  Medication Review Oceanographer) Complete Complete Complete  PCP or Specialist appointment within 3-5 days of discharge Complete Complete   HRI or Home Care Consult Complete    SW Recovery Care/Counseling Consult Complete Complete   Palliative Care Screening Not Applicable Not Applicable  Skilled Nursing Facility Not Applicable Not Applicable

## 2023-12-11 NOTE — Plan of Care (Signed)
  Problem: Respiratory: Goal: Ability to maintain adequate ventilation will improve Outcome: Progressing   Problem: Education: Goal: Knowledge of General Education information will improve Description: Including pain rating scale, medication(s)/side effects and non-pharmacologic comfort measures Outcome: Progressing   Problem: Fluid Volume: Goal: Hemodynamic stability will improve Outcome: Not Progressing   Problem: Clinical Measurements: Goal: Diagnostic test results will improve Outcome: Not Progressing Goal: Signs and symptoms of infection will decrease Outcome: Not Progressing   Problem: Clinical Measurements: Goal: Ability to maintain clinical measurements within normal limits will improve Outcome: Not Progressing Goal: Will remain free from infection Outcome: Not Progressing

## 2023-12-12 DIAGNOSIS — G009 Bacterial meningitis, unspecified: Secondary | ICD-10-CM | POA: Diagnosis not present

## 2023-12-12 DIAGNOSIS — G032 Benign recurrent meningitis [Mollaret]: Secondary | ICD-10-CM | POA: Diagnosis not present

## 2023-12-12 LAB — BASIC METABOLIC PANEL
Anion gap: 7 (ref 5–15)
BUN: <5 mg/dL — ABNORMAL LOW (ref 6–20)
CO2: 22 mmol/L (ref 22–32)
Calcium: 8.4 mg/dL — ABNORMAL LOW (ref 8.9–10.3)
Chloride: 109 mmol/L (ref 98–111)
Creatinine, Ser: 0.7 mg/dL (ref 0.44–1.00)
GFR, Estimated: >60 mL/min (ref >60)
Glucose, Bld: 106 mg/dL — ABNORMAL HIGH (ref 70–99)
Potassium: 3.7 mmol/L (ref 3.5–5.1)
Sodium: 138 mmol/L (ref 135–145)

## 2023-12-12 LAB — CBC
HCT: 33.5 % — ABNORMAL LOW (ref 36.0–46.0)
Hemoglobin: 10.8 g/dL — ABNORMAL LOW (ref 12.0–15.0)
MCH: 30.8 pg (ref 26.0–34.0)
MCHC: 32.2 g/dL (ref 30.0–36.0)
MCV: 95.4 fL (ref 80.0–100.0)
Platelets: 147 10*3/uL — ABNORMAL LOW (ref 150–400)
RBC: 3.51 MIL/uL — ABNORMAL LOW (ref 3.87–5.11)
RDW: 16.4 % — ABNORMAL HIGH (ref 11.5–15.5)
WBC: 4.5 10*3/uL (ref 4.0–10.5)
nRBC: 0 % (ref 0.0–0.2)

## 2023-12-12 LAB — MAGNESIUM: Magnesium: 2.3 mg/dL (ref 1.7–2.4)

## 2023-12-12 MED ORDER — VITAMIN C 500 MG PO TABS
500.0000 mg | ORAL_TABLET | Freq: Two times a day (BID) | ORAL | Status: DC
  Administered 2023-12-12 – 2023-12-15 (×6): 500 mg via ORAL
  Filled 2023-12-12 (×6): qty 1

## 2023-12-12 MED ORDER — ENSURE ENLIVE PO LIQD
237.0000 mL | Freq: Two times a day (BID) | ORAL | Status: DC
  Administered 2023-12-12 – 2023-12-15 (×6): 237 mL via ORAL

## 2023-12-12 MED ORDER — ADULT MULTIVITAMIN W/MINERALS CH
1.0000 | ORAL_TABLET | Freq: Every day | ORAL | Status: DC
  Administered 2023-12-13 – 2023-12-15 (×3): 1 via ORAL
  Filled 2023-12-12 (×3): qty 1

## 2023-12-12 MED ORDER — ACYCLOVIR 5 % EX OINT
TOPICAL_OINTMENT | CUTANEOUS | Status: DC
  Filled 2023-12-12 (×2): qty 15

## 2023-12-12 MED ORDER — MAGIC MOUTHWASH: 5.0000 mL | Freq: Three times a day (TID) | ORAL | Status: DC | PRN

## 2023-12-12 NOTE — Progress Notes (Signed)
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

## 2023-12-12 NOTE — Progress Notes (Signed)
Initial Nutrition Assessment  DOCUMENTATION CODES:   Obesity unspecified  INTERVENTION:   Ensure Enlive po BID, each supplement provides 350 kcal and 20 grams of protein.  MVI po daily   Vitamin C 500mg  po BID   Pt at high refeed risk; recommend monitor potassium, magnesium and phosphorus labs daily until stable  Daily weights   NUTRITION DIAGNOSIS:   Inadequate oral intake related to acute illness as evidenced by per patient/family report.  GOAL:   Patient will meet greater than or equal to 90% of their needs  MONITOR:   PO intake, Supplement acceptance, Labs, Weight trends, I & O's, Skin  REASON FOR ASSESSMENT:   Malnutrition Screening Tool    ASSESSMENT:   47 y.o. female with medical history significant for malignant uterine sarcoma s/p total abdominal hysterectomy (TAH), urinary retention, chronic pyelonephritis, NSAT with a loop recorder, EOE, chronic cutaneous candidiasis, seropositive RA, tracheobronchomalacia, osteoarthritis, depression, anxiety, HLD, lymphedema, HSV, chronic pain, antibody deficiency receiving IVIG monthly, migraine, gastroparesis, DVT, IBD and aseptic meningitis who is admitted with recurrent meningitis and sepsis.  -Pt s/p LP 12/16  Met with pt in room today. Pt reports decreased oral intake for the past 3 weeks. Pt relates her decreased oral intake to a recent admission at [facility] where she reports she had herpes rash all over her face and sores all in her mouth. Pt reports that her oral intake has improved but reports that she still has an ulcer on the roof her mouth which causes her pain with eating. RD is able to visualize the ulcer on the roof of patient's mouth today. Pt reports that she has been using miracle mouthwash at home to help numb the ulcer before she eats. Pt reports that she has been drinking vanilla Ensure at home. Pt reports that she has lost 22lbs over the past few weeks. Per chart, pt appears to be down ~3lbs over the past month  is her admission weight is correct. Pt is currently eating 100% of meals. RD will add supplements and vitamins to help pt meet her estimated needs and to support wound healing. Pt is at refeed risk.    Medications reviewed and include: folic acid, midodrine, protonix, miralax  Labs reviewed: K 3.7 wnl, BUN <5(L), Mg 2.3 wnl Hgb 10.8(L), Hct 33.5(L)  NUTRITION - FOCUSED PHYSICAL EXAM:  Flowsheet Row Most Recent Value  Orbital Region No depletion  Upper Arm Region No depletion  Thoracic and Lumbar Region No depletion  Buccal Region No depletion  Temple Region No depletion  Clavicle Bone Region No depletion  Clavicle and Acromion Bone Region No depletion  Scapular Bone Region No depletion  Dorsal Hand No depletion  Patellar Region No depletion  Anterior Thigh Region No depletion  Posterior Calf Region No depletion  Edema (RD Assessment) Mild  Hair Reviewed  Eyes Reviewed  Mouth Reviewed  Skin Reviewed  Nails Reviewed   Diet Order:   Diet Order             Diet regular Room service appropriate? Yes; Fluid consistency: Thin  Diet effective now                  EDUCATION NEEDS:   Education needs have been addressed  Skin:  Skin Assessment: Reviewed RN Assessment  Last BM:  pta  Height:   Ht Readings from Last 1 Encounters:  12/11/23 5\' 4"  (1.626 m)    Weight:   Wt Readings from Last 1 Encounters:  12/11/23 88 kg  Ideal Body Weight:  54.5 kg  BMI:  Body mass index is 33.3 kg/m.  Estimated Nutritional Needs:   Kcal:  1900-2200kcal/day  Protein:  95-110g/day  Fluid:  1.7-1.9L/day  Betsey Holiday MS, RD, LDN If unable to be reached, please send secure chat to "RD inpatient" available from 8:00a-4:00p daily

## 2023-12-12 NOTE — Progress Notes (Signed)
PROGRESS NOTE    [name] Horn  DGU:[id number] DOB: 1976/03/07 DOA: 12/10/2023 PCP: [facility]     Brief Narrative:  [name] Horn is a 47 y.o. female with medical history significant for antibody deficiency receiving IVIG monthly, rheumatoid arthritis, migraine, eosinophilic esophagitis, gastroparesis, DVT, IBD and aseptic meningitis for which she was admitted in April 2024, who presented to the emergency room with acute onset of fever and associated headache.  She was recently admitted to [facility] for disseminated herpes simplex 1 infection for a couple of weeks.  She had IVIG injection on Friday 12/13.  Early morning on Sunday 12/15, she started to have severe headaches, that was similar in presentation to her previous aseptic meningitis episode in April 2024.  Also associated nausea and vomiting as well as fevers.  Yesterday afternoon, patient became hypotensive, moved to ICU for Levophed.  CCM consulted.  New events last 24 hours / Subjective: Patient was weaned off levophed yesterday afternoon with stable BP. She states that her headache has improved a little, although still having issues turning her head left/right due to pain. Afebrile.   Assessment & Plan:   Principal Problem:   Benign recurrent aseptic meningitis Active Problems:   Migraine   Depression   Dyslipidemia   GERD without esophagitis   Concern for recurrent aseptic meningitis with history of selective immunoglobulin deficiency, status post IVIG -LP 12/16  -Blood cultures negative to date  -CSF culture pending  -M/E Panel negative  -ID following   Hypotension -Levophed off -Resolved  -Appreciate CCM   History of disseminated HSV -Zovirax - was told she would be on this for life  -Prednisolone eyedrops  Dyslipidemia -Pravachol  Chronic pain syndrome -Uses oxycodone 15 mg every 4-6 hours  Acute urinary retention -Bladder scan revealed >999 mL.  In-N-Out cath obtained  1725ml.  Patient has urology follow-up scheduled outpatient    DVT prophylaxis: SCD Place and maintain sequential compression device Start: 12/11/23 1344  Code Status: Full Family Communication: None at bedside  Disposition Plan: Home Status is: Inpatient Remains inpatient appropriate because: further work up  Antimicrobials:  Anti-infectives (From admission, onward)    Start     Dose/Rate Route Frequency Ordered Stop   12/11/23 2300  valACYclovir (VALTREX) tablet 500 mg        500 mg Oral 2 times daily 12/11/23 1841     12/11/23 2000  vancomycin (VANCOCIN) IVPB 1000 mg/200 mL premix  Status:  Discontinued        1,000 mg 200 mL/hr over 60 Minutes Intravenous Every 12 hours 12/10/23 1243 12/11/23 1841   12/11/23 0000  vancomycin (VANCOREADY) IVPB 1750 mg/350 mL  Status:  Discontinued        1,750 mg 175 mL/hr over 120 Minutes Intravenous Every 24 hours 12/10/23 0543 12/10/23 1243   12/10/23 1800  cefTRIAXone (ROCEPHIN) 2 g in sodium chloride 0.9 % 100 mL IVPB  Status:  Discontinued        2 g 200 mL/hr over 30 Minutes Intravenous Every 12 hours 12/10/23 0526 12/11/23 1841   12/10/23 1800  acyclovir (ZOVIRAX) 660 mg in dextrose 5 % 250 mL IVPB  Status:  Discontinued        66 0 mg 263.2 mL/hr over 60 Minutes Intravenous Every 8 hours 12/10/23 1201 12/11/23 1841   12/10/23 1000  valACYclovir (VALTREX) tablet 1,000 mg  Status:  Discontinued        1,000 mg Oral Daily 12/10/23 0705 12/10/23 1105  12/10/23 0900  acyclovir (ZOVIRAX) 545 mg in dextrose 5 % 100 mL IVPB        10 mg/kg  54.7 kg (Ideal) 110.9 mL/hr over 60 Minutes Intravenous  Once 12/10/23 0453 12/10/23 1143   12/10/23 0700  acyclovir (ZOVIRAX) 545 mg in dextrose 5 % 100 mL IVPB  Status:  Discontinued        10 mg/kg  54.7 kg (Ideal) 110.9 mL/hr over 60 Minutes Intravenous  Once 12/10/23 0450 12/10/23 0453   12/10/23 0600  vancomycin (VANCOREADY) IVPB 1750 mg/350 mL  Status:  Discontinued        1,750 mg 175 mL/hr  over 120 Minutes Intravenous  Once 12/10/23 0436 12/10/23 0453   12/10/23 0600  vancomycin (VANCOCIN) 1,750 mg in sodium chloride 0.9 % 500 mL IVPB        1,750 mg 258.8 mL/hr over 120 Minutes Intravenous  Once 12/10/23 0454 12/10/23 0751   12/10/23 0530  vancomycin (VANCOCIN) IVPB 1000 mg/200 mL premix  Status:  Discontinued        1,000 mg 200 mL/hr over 60 Minutes Intravenous  Once 12/10/23 0526 12/10/23 0534   12/10/23 0445  cefTRIAXone (ROCEPHIN) 2 g in sodium chloride 0.9 % 100 mL IVPB        2 g 200 mL/hr over 30 Minutes Intravenous  Once 12/10/23 0436 12/10/23 0507        Objective: Vitals:   12/12/23 1000 12/12/23 1100 12/12/23 1138 12/12/23 1200  BP: 105/68 112/80  115/88  Pulse: 71 88 78 80  Resp: 10 15 12 14   Temp:      TempSrc:      SpO2: 94% 99% 96% 99%  Weight:      Height:        Intake/Output Summary (Last 24 hours) at 12/12/2023 1221 Last data filed at 12/12/2023 1025 Gross per 24 hour  Intake 3195.57 ml  Output 4825 ml  Net -1629.43 ml   Filed Weights   12/11/23 0155  Weight: 88 kg    Examination:  General exam: Appears calm and comfortable  Respiratory system: Respiratory effort normal. No respiratory distress. No conversational dyspnea.  Cardiovascular system: NSR.  Central nervous system: Alert and oriented. No focal neurological deficits. Speech clear.  Extremities: Symmetric in appearance  Psychiatry: Judgement and insight appear normal. Mood & affect appropriate.   Data Reviewed: I have personally reviewed following labs and imaging studies  CBC: Recent Labs  Lab 12/10/23 0105 12/10/23 0616 12/11/23 0658 12/12/23 0402  WBC 5.7 6.2 4.6 4.5  NEUTROABS 3.3  --   --   --   HGB 11.5* 10.1* 9.1* 10.8*  HCT 34.0* 29.9* 27.6* 33.5*  MCV 93.7 94.6 94.2 95.4  PLT 171 153 132* 147*   Basic Metabolic Panel: Recent Labs  Lab 12/10/23 0104 12/10/23 0105 12/10/23 0616 12/10/23 0958 12/11/23 0658 12/12/23 0402  NA  --  139  --   --   140 138  K  --  2.6*  --  3.7 3.6 3.7  CL  --  99  --   --  110 109  CO2  --  28  --   --  22 22  GLUCOSE  --  117*  --   --  115* 106*  BUN  --  <5*  --   --  <5* <5*  CREATININE  --  0.88 0.88  --  0.76 0.70  CALCIUM  --  8.9  --   --  7.8*  8.4*  MG 1.3*  --   --   --  1.7 2.3   GFR: Estimated Creatinine Clearance: 91.3 mL/min (by C-G formula based on SCr of 0.7 mg/dL). Liver Function Tests: Recent Labs  Lab 12/10/23 0105  AST 93*  ALT 78*  ALKPHOS 97  BILITOT 0.9  PROT 8.1  ALBUMIN 3.7   No results for input(s): "LIPASE", "AMYLASE" in the last 168 hours. No results for input(s): "AMMONIA" in the last 168 hours. Coagulation Profile: Recent Labs  Lab 12/10/23 0105 12/11/23 0658  INR 1.2 1.2   Cardiac Enzymes: No results for input(s): "CKTOTAL", "CKMB", "CKMBINDEX", "TROPONINI" in the last 168 hours. BNP (last 3 results) No results for input(s): "PROBNP" in the last 8760 hours. HbA1C: No results for input(s): "HGBA1C" in the last 72 hours. CBG: Recent Labs  Lab 12/11/23 0157  GLUCAP 102*   Lipid Profile: No results for input(s): "CHOL", "HDL", "LDLCALC", "TRIG", "CHOLHDL", "LDLDIRECT" in the last 72 hours. Thyroid Function Tests: No results for input(s): "TSH", "T4TOTAL", "FREET4", "T3FREE", "THYROIDAB" in the last 72 hours. Anemia Panel: No results for input(s): "VITAMINB12", "FOLATE", "FERRITIN", "TIBC", "IRON", "RETICCTPCT" in the last 72 hours. Sepsis Labs: Recent Labs  Lab 12/10/23 0105 12/10/23 1837 12/10/23 2142 12/11/23 0658  PROCALCITON <0.10  --   --  <0.10  LATICACIDVEN 1.8 1.6 1.1  --     Recent Results (from the past 240 hours)  Blood Culture (routine x 2)     Status: None (Preliminary result)   Collection Time: 12/10/23  1:05 AM   Specimen: BLOOD  Result Value Ref Range Status   Specimen Description BLOOD RIGHT ANTECUBITAL  Final   Special Requests   Final    BOTTLES DRAWN AEROBIC AND ANAEROBIC Blood Culture results may not be optimal  due to an inadequate volume of blood received in culture bottles   Culture   Final    NO GROWTH 2 DAYS Performed at [facility], [address], [place], [place] [address]    Report Status PENDING  Incomplete  Blood Culture (routine x 2)     Status: None (Preliminary result)   Collection Time: 12/10/23  1:05 AM   Specimen: BLOOD  Result Value Ref Range Status   Specimen Description BLOOD BLOOD RIGHT FOREARM  Final   Special Requests   Final    BOTTLES DRAWN AEROBIC AND ANAEROBIC Blood Culture adequate volume   Culture   Final    NO GROWTH 2 DAYS Performed at [facility], [address] [place], [place] [address]    Report Status PENDING  Incomplete  CSF culture w Gram Stain     Status: None (Preliminary result)   Collection Time: 12/10/23 12:15 PM   Specimen: CSF; Cerebrospinal Fluid  Result Value Ref Range Status   Specimen Description   Final    CSF Performed at [facility], [address], [place], [place] [address]    Special Requests   Final    CSF Performed at [facility], [address] [place], [place] [address]    Gram Stain   Final    RED BLOOD CELLS WBC SEEN NO ORGANISMS SEEN Performed at [facility], [address] [place], [place] [address]    Culture   Final    NO GROWTH < 24 HOURS Performed at [facility], [address], [place], [place] [address]    Report Status PENDING  Incomplete  MRSA Next Gen by PCR, Nasal  Status: None   Collection Time: 12/11/23  2:30 AM   Specimen: Nasal Mucosa; Nasal Swab  Result Value Ref Range Status   MRSA by PCR Next Gen NOT DETECTED NOT DETECTED Final    Comment: (NOTE) The GeneXpert MRSA Assay (FDA approved for NASAL specimens only), is one component of a comprehensive MRSA colonization surveillance program. It is not intended to diagnose MRSA infection nor to guide or monitor treatment for MRSA infections. Test performance is not FDA approved in  patients less than 69 years old. Performed at [facility], [address] [place], [place] [address]       Radiology Studies: IR LUMBAR PUNCTURE Result Date: 12/11/2023 CLINICAL DATA:  Sepsis and suspected IV IG induced aseptic meningitis. Rheumatoid arthritis. EXAM: DIAGNOSTIC LUMBAR PUNCTURE UNDER FLUOROSCOPIC GUIDANCE COMPARISON:  None Available. FLUOROSCOPY: Radiation Exposure Index (as provided by the fluoroscopic device): 9.0 mGy Kerma PROCEDURE: Informed consent was obtained from the patient prior to the procedure, including potential complications of headache, allergy, and pain. With the patient prone, the lower back was prepped with ChloraPrep. 1% Lidocaine was used for local anesthesia. Lumbar puncture was performed at the L4-5 level using a 20 gauge needle with return of slightly pink tinged CSF. 10 cc ml of CSF were obtained for laboratory studies. The patient tolerated the procedure well and there were no apparent complications. IMPRESSION: Successful L4-5 fluoroscopic lumbar puncture. Electronically Signed   By: Judie Petit.  Shick M.D.   On: 12/11/2023 13:24      Scheduled Meds:  acyclovir ointment   Topical Q3H   Chlorhexidine Gluconate Cloth  6 each Topical Daily   cycloSPORINE  1 drop Both Eyes BID   feeding supplement  237 mL Oral BID BM   folic acid  2 mg Oral Daily   midodrine  10 mg Oral TID WC   mometasone-formoterol  2 puff Inhalation BID   naloxone  1 spray Nasal Once   pantoprazole  40 mg Oral Daily   polyethylene glycol  17 g Oral Daily   pravastatin  40 mg Oral QHS   topiramate  100 mg Oral Daily   valACYclovir  500 mg Oral BID   Continuous Infusions:     LOS: 2 days   Time spent: 25 minutes   Noralee Stain, DO [facility] 12/12/2023, 12:21 PM   Available via Epic secure chat 7am-7pm After these hours, please refer to coverage provider listed on [email or web address]

## 2023-12-12 NOTE — Progress Notes (Signed)
NAME:  [name] Horn, MRN:  [id number], DOB:  September 04, 1976, LOS: 2 ADMISSION DATE:  12/10/2023, CONSULTATION DATE:  12/10/23 REFERRING MD: Lorretta Harp, REASON FOR CONSULT:  Hypotension   HPI  47 y.o  female with complicated PMH including Malignant Uterine Sarcoma s/p total abdominal hysterectomy (TAH), urinary retention, chronic pyelonephritis, NSAT with a loop recorder, EOE, chronic cutaneous candidiasis, seropositive RA, tracheobronchomalacia, IgG subclass deficiency, keratoconjunctivitis sicca, and osteoarthritis  who presented to the ED with chief complaints of recurrent fevers post IVIG.  On review of chart, patient has had multiple admission this year with recent admission at [facility] from 11/08/23 - 11/23/23 for Mucocutaneous Rash 2/2 Disseminated HSV-1Erythema Multiforme and Sepsis 2/2 Disseminated HSV. Work up revealed + serum Abs for HSV1 and HSV2, + serum HSV PCR (was on tx ); skin biopsy with interface dermatitis c/w erythema multiforme. Dermatology, ENT, Ophthalmology, GI, oral medicine, and Infectious Disease followed while inpatient. Hard palate biopsy non-diagnostic, but negative for HSV. Pt underwent EGD with no evidence of Candida, CMV, or HSV esophagitis, although was consistent with known diagnosis of eosinophilic esophagitis. She was treated with high-dose IV acyclovir for 14 days with end of treatment 11/22/2023. At discharge she was transitioned to valacyclovir 500 mg twice daily for indefinite suppression.  Since discharge she has followed up with [facility] and immunology clinic and also had IVIG treatment on 12/08/2023.   ED Course: Initial vital signs showed HR of 120 beats/minute, BP 128/68 mm Hg, the RR 19 breaths/minute, and the oxygen saturation 97% on  RA and a temperature of 102.7 F (39.3 C)  Pertinent Labs/Diagnostics Findings: Na+/ K+/Mg:139/2.6/1.3  Glucose: 11, AST/ALT:93/78 WBC: unremarkable without bands or neutrophil predominance   PCT: negative <0.10   CXR> no active disease Medication Administered in the ED: morphine (PF) 4 MG IV vancomycin IVPB 1750 mg/350 mL, ceftriaxone 2 g , LR 1,000 Ml, Droperidol  2.5 MG/ML injection 2.5 mg, ketorolac 30 MG/ML injection 15 mg, FIORICET) 50-325-40 , potassium chloride SA CR tablet 40 mEq (40 mEq Oral X2, magnesium sulfate IVPB 2 g 50 Ml, oxymetazoline 0.05 % nasal spray 1 spray (1 spray Each Nare Disposition: Admitted to stepdown unit by hospitalist service for evaluation of sepsis secondary to suspected meningitis  Past Medical History  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  Remained hypotensive despite IV fluid bolus requiring vasopressors.  PCCM consulted 12/11/23- patient is s/p LP with some blood in tap but definitely abnormal and is conistent with atypical meningitis.  12/12/23- s/p ID evaluation, CSF workup reviewed.   Consults:  PCCM ID  Procedures:  None  Significant Diagnostic Tests:  12/15: Chest Xray> no acute cardiopulmonary process   Interim History / Subjective:      Micro Data:  12/15: Blood culture x2>  Antimicrobials:  Vancomycin 12/15> Ceftriaxone 12/15>  OBJECTIVE  Blood pressure 122/75, pulse 66, temperature 98.1 F (36.7 C), temperature source Oral, resp. rate (!) 9, height 5\' 4"  (1.626 m), weight 88 kg, SpO2 98%.        Intake/Output Summary (Last 24 hours) at 12/12/2023 0757 Last data filed at 12/12/2023 0600 Gross per 24 hour  Intake 4054.22 ml  Output 4825 ml  Net -770.78 ml   [employer]  12/11/23 0155  Weight: 88 kg    Physical Examination  GENERAL: 47 year-old critically ill patient  lying in the bed  EYES: PEERLA. No scleral icterus. Extraocular muscles intact.  HEENT: Head atraumatic, normocephalic. Oropharynx and nasopharynx with multiple healing vesicles on the hard palate  NECK:  No JVD, supple  LUNGS: Normal breath sounds bilaterally.  No use of accessory muscles of respiration.  CARDIOVASCULAR: S1, S2 normal. No murmurs, rubs, or gallops.  ABDOMEN: Soft, NTND EXTREMITIES: Dependent edema of lower legs NEUROLOGIC: No focal neurological deficit appreciated. Cranial nerves are intact.  SKIN: Well-healing lesions from prior HSV vesicles (neck, upper arms)  Labs/imaging that I havepersonally reviewed  (right click and "Reselect all SmartList Selections" daily)     Labs   CBC: Recent Labs  Lab 12/10/23 0105 12/10/23 0616 12/11/23 0658 12/12/23 0402  WBC 5.7 6.2 4.6 4.5  NEUTROABS 3.3  --   --   --   HGB 11.5* 10.1* 9.1* 10.8*  HCT 34.0* 29.9* 27.6* 33.5*  MCV 93.7 94.6 94.2 95.4  PLT 171 153 132* 147*    Basic Metabolic Panel: Recent Labs  Lab 12/10/23 0104 12/10/23 0105 12/10/23 0616 12/10/23 0958 12/11/23 0658 12/12/23 0402  NA  --  139  --   --  140 138  K  --  2.6*  --  3.7 3.6 3.7  CL  --  99  --   --  110 109  CO2  --  28  --   --  22 22  GLUCOSE  --  117*  --   --  115* 106*  BUN  --  <5*  --   --  <5* <5*  CREATININE  --  0.88 0.88  --  0.76 0.70  CALCIUM  --  8.9  --   --  7.8* 8.4*  MG 1.3*  --   --   --  1.7 2.3   GFR: Estimated Creatinine Clearance: 91.3 mL/min (by C-G formula based on SCr of 0.7 mg/dL). Recent Labs  Lab 12/10/23 0105 12/10/23 0616 12/10/23 1837 12/10/23 2142 12/11/23 0658 12/12/23 0402  PROCALCITON <0.10  --   --   --  <0.10  --   WBC 5.7 6.2  --   --  4.6 4.5  LATICACIDVEN 1.8  --  1.6 1.1  --   --     Liver Function Tests: Recent Labs  Lab 12/10/23 0105  AST 93*  ALT 78*  ALKPHOS 97  BILITOT 0.9  PROT 8.1  ALBUMIN 3.7   No results for input(s): "LIPASE", "AMYLASE" in the last 168  hours. No results for input(s): "AMMONIA" in the last 168 hours.  ABG    Component Value Date/Time   TCO2 25 04/25/2017 1429     Coagulation Profile: Recent Labs  Lab 12/10/23 0105 12/11/23 0658  INR 1.2 1.2    Cardiac Enzymes: No results for input(s): "CKTOTAL", "CKMB", "CKMBINDEX", "TROPONINI" in the last 168 hours.  HbA1C: Hgb A1c MFr Bld  Date/Time Value Ref Range Status  06/06/2023 01:46 PM 5.7 (H) 4.8 - 5.6 % Final    Comment:    (NOTE) Pre diabetes:          5.7%-6.4%  Diabetes:              >6.4%  Glycemic control for   <7.0% adults with diabetes     CBG: Recent Labs  Lab 12/11/23 0157  GLUCAP 102*    Review of Systems:  10 point ROS negative except as per HPI  Past Medical History  She,  has a past medical history of Cellulitis, leg (09/14/2021), Complication of anesthesia, COVID-19 (07/06/2021), DVT (deep venous thrombosis) (HCC), Fatigue (12/30/2020), Fever (10/05/2020), Gastroparesis (08/07/2019), 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   Surgical History    Past Surgical History:  Procedure Laterality Date   APPENDECTOMY     cheek biopsy  11/2020   CHOLECYSTECTOMY     ESOPHAGOGASTRODUODENOSCOPY N/A 04/30/2017   Procedure: ESOPHAGOGASTRODUODENOSCOPY (EGD);  Surgeon: Dorena Cookey, MD;  Location: [facility] ENDOSCOPY;  Service: Endoscopy;  Laterality: N/A;   HERNIA REPAIR     x2   IR LUMBAR PUNCTURE  12/11/2023   KNEE SURGERY     x3   MINIMALLY INVASIVE FORAMINOTOMY CERVICAL SPINE     C6-T1, Nitka   SAVORY DILATION N/A 04/30/2017   Procedure: SAVORY DILATION;  Surgeon: Dorena Cookey, MD;  Location: [facility] ENDOSCOPY;  Service: Endoscopy;  Laterality: N/A;    TONSILLECTOMY     TOTAL ABDOMINAL HYSTERECTOMY     Sarcoma, s/p XRT   Social History   reports that she has never smoked. She has been exposed to tobacco smoke. She has never used smokeless tobacco. She reports that she does not currently use alcohol. She reports current drug use.   Family History   Her family history includes Angioedema in her maternal grandfather and mother; Arrhythmia in her father; Colon cancer in an other family member; Colonic polyp in her mother; Eczema in her mother; Hashimoto's thyroiditis in her mother; Hyperlipidemia in an other family member; Hypertension in an other family member; Throat cancer in her father.   Allergies Allergies  Allergen Reactions   Cyanoacrylate Hives   Other Itching and Rash    Dermabond surgical glue.  Dermabond surgical glue-blisters   Sulfa Antibiotics Anaphylaxis, Rash, Shortness Of Breath and Swelling    Angioedema (also)   Sulfonamide Derivatives Hives, Shortness Of Breath and Swelling    TONGUE SWELLS   Benadryl [Diphenhydramine Hcl] Other (See Comments)    Hyperactivity    Sulfamethoxazole     Other Reaction(s): Angioedema   Diphenhydramine    Diphenhydramine Hcl     Other Reaction(s): Other (See Comments)  HYPERACTIVITY, RESTLESS LEG   Silicone Rash    Watch band     Home Medications  Prior to Admission medications   Medication Sig Start Date End Date Taking? Authorizing Provider  albuterol (PROVENTIL) (2.5 MG/3ML) 0.083% nebulizer solution Take 3 mLs (2.5 mg total) by nebulization every 4 (four) hours as needed for wheezing or shortness of breath. 11/20/22  Yes Pahwani, Rinka R, MD  albuterol (VENTOLIN HFA) 108 (90 Base) MCG/ACT inhaler Inhale 1 puff into the lungs every 4 (four) hours as needed. 04/12/23  Yes [provider]  amphetamine-dextroamphetamine (ADDERALL) 20 MG tablet Take 20 mg by mouth 3 (three) times daily.   Yes [provider]  budesonide-formoterol (SYMBICORT) 80-4.5 MCG/ACT  inhaler Inhale 2 puffs into the lungs 2 (two) times daily. Can also take Q4 hr as needed for wheezing 11/17/22  Yes Adhikari, Willia Craze, MD  cycloSPORINE (RESTASIS) 0.05 % ophthalmic emulsion Place 1 drop into both eyes 2 (two) times daily.   Yes [provider]  dicyclomine (BENTYL) 10 MG capsule Take 10 mg by mouth daily as needed for nausea/vomiting. 01/02/19  Yes [provider]  famotidine (PEPCID) 40 MG tablet Take 40 mg by mouth at bedtime. 05/02/21  Yes [provider]  folic acid (FOLVITE) 1 MG tablet Take 2 tablets (2 mg total) by mouth daily. Patient taking differently: Take 1 mg by mouth daily. 10/30/19  Yes Deveshwar, Janalyn Rouse, MD  furosemide (LASIX) 20 MG tablet Take 60 mg by mouth 2 (two) times daily as needed for fluid or edema.   Yes [provider]  hydrOXYzine (ATARAX) 25 MG tablet Take 1 tablet (25 mg total) by mouth 2 (two) times daily. Patient taking differently: Take 25 mg by mouth 2 (two) times daily as needed for itching or anxiety. 09/21/23  Yes Loyce Dys, MD  lactulose (CHRONULAC) 10 GM/15ML solution Take 30 mLs (20 g total) by mouth daily as needed for mild constipation. 09/21/23  Yes Loyce Dys, MD  naloxone [facility]) nasal spray 4 mg/0.1 mL Place 1 spray into the nose once. 02/13/23  Yes [provider]  nystatin (MYCOSTATIN) 100000 UNIT/ML suspension Take 10 mLs (1,000,000 Units total) by mouth 3 (three) times daily. Patient taking differently: Take 10 mLs by mouth 3 (three) times daily as needed (Mouth sores). 12/07/20  Yes Randall Hiss, MD  nystatin powder Apply 1 application topically 2 (two) times daily as needed (rash).   Yes [provider]  ondansetron (ZOFRAN-ODT) 8 MG disintegrating tablet Take 1 tablet (8 mg total) by mouth every 8 (eight) hours as needed. 09/21/23  Yes Loyce Dys, MD  oxyCODONE (ROXICODONE) 15 MG immediate release tablet Take 15 mg by mouth every 4 (four) hours as needed. 12/04/23  Yes  [provider]  pantoprazole (PROTONIX) 40 MG tablet Take 1 tablet (40 mg total) by mouth daily. Patient taking differently: Take 40 mg by mouth 2 (two) times daily. 09/21/23 09/20/24 Yes Djan, Scarlette Calico, MD  Polyethyl Glycol-Propyl Glycol (SYSTANE) 0.4-0.3 % GEL ophthalmic gel Place 1 application  into both eyes 2 (two) times daily as needed (Eye dryness).   Yes [provider]  Polyethyl Glycol-Propyl Glycol (SYSTANE) 0.4-0.3 % SOLN Apply 1 drop to eye in the morning, at noon, and at bedtime.   Yes [provider]  polyethylene glycol (MIRALAX / GLYCOLAX) 17 g packet Take 17 g by mouth daily. Patient taking differently: Take 17 g by mouth daily as needed for moderate constipation. 09/21/23  Yes Loyce Dys, MD  Potassium Chloride ER 20 MEQ TBCR Take 20 mEq by mouth daily as needed (when taking furosemide).   Yes [provider]  pravastatin (PRAVACHOL) 40 MG tablet Take 40 mg by mouth at bedtime.   Yes [provider]  prednisoLONE acetate (PRED FORTE) 1 % ophthalmic suspension Place 1 drop into the left eye as needed (conjunctivitis).   Yes [provider]  PRIVIGEN 40 GM/400ML SOLN Inject 40 g into the vein every 28 (twenty-eight) days. 11/02/23  Yes Shelba Flake, FNP  prochlorperazine (COMPAZINE) 10 MG tablet Take 1 tablet (10 mg total) by mouth every 6 (six) hours as needed for nausea or vomiting. 09/07/23  Yes Evon Slack, PA-C  promethazine (PHENERGAN) 25 MG tablet Take 1 tablet (25 mg total) by mouth every 6 (six) hours as needed for nausea or vomiting. 09/21/23  Yes Loyce Dys, MD  psyllium (HYDROCIL/METAMUCIL) 95 % PACK Take 1 packet by mouth daily. Patient taking differently: Take 1 packet by mouth daily as needed for moderate constipation. 09/21/23  Yes Loyce Dys, MD  rizatriptan (  MAXALT-MLT) 10 MG disintegrating tablet Take 10 mg by mouth daily as needed for migraine.   Yes [provider]  tiZANidine  (ZANAFLEX) 2 MG tablet Take 2 mg by mouth 3 (three) times daily. 08/28/23  Yes [provider]  topiramate (TOPAMAX) 100 MG tablet Take 100 mg by mouth daily. 06/18/19  Yes [provider]  topiramate (TOPAMAX) 25 MG tablet Take 25 mg by mouth at bedtime.   Yes [provider]  triamcinolone lotion (KENALOG) 0.1 % Apply 1 application topically daily as needed (rash). 07/07/20  Yes [provider]  valACYclovir (VALTREX) 1000 MG tablet Take 1 tablet (1,000 mg total) by mouth daily. Patient taking differently: Take 500 mg by mouth 2 (two) times daily. 08/12/20  Yes Daiva Eves, Lisette Grinder, MD  Scheduled Meds:  Chlorhexidine Gluconate Cloth  6 each Topical Daily   cycloSPORINE  1 drop Both Eyes BID   folic acid  2 mg Oral Daily   midodrine  10 mg Oral TID WC   mometasone-formoterol  2 puff Inhalation BID   naloxone  1 spray Nasal Once   pantoprazole  40 mg Oral Daily   polyethylene glycol  17 g Oral Daily   pravastatin  40 mg Oral QHS   topiramate  100 mg Oral Daily   valACYclovir  500 mg Oral BID   Continuous Infusions:  sodium chloride 125 mL/hr at 12/12/23 0600   norepinephrine (LEVOPHED) Adult infusion Stopped (12/11/23 1359)   PRN Meds:.acetaminophen **OR** acetaminophen, albuterol, butalbital-acetaminophen-caffeine, dicyclomine, EPINEPHrine, HYDROmorphone (DILAUDID) injection, hydrOXYzine, magnesium hydroxide, nystatin, nystatin, mouth rinse, oxyCODONE, polyvinyl alcohol, prednisoLONE acetate, prochlorperazine, SUMAtriptan, traZODone, triamcinolone cream  Active Hospital Problem list   See systems below  Assessment & Plan:  #Sepsis Secondary Aseptic Meningitis -ID Consult- appreciate input  -continue current scope of therapy    #IgG Subclass Deficiency on IVIG #Seropositive RA Extensive Rheum/ID/Allergy hx. Follows with [facility] immunology and is on IVIG therapy in for IgG subclass deficiency  -Last IVIG 12/08/23  -RA previously on MTX but this was  stopped due to an episode of aseptic meningitis.   #Mild Transaminitis  Acute elevation in AST, ALT. normal alk phos and bili. Most likely 2/2 infection/sepsis. 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  -Continue Acyclovir IV -Ophthalmology at [facility] evaluated for with no concern for HSV keratitis.. -Continued home cyclosporin eye drops and Genteal eye drops per ophthalmology.   #Chronic pain -On Oxycodone at home -continued home tizanidine and PRN IV meds for adjunct pain management.   #Migraines -Continue home topiramate, Fioricet and triptan  #Lymphedema #Chronic Venous Insufficiency -Hold home Lasix   #Depression #Anxiety -Trazodone and Hydroxyzine    Best practice:  Diet:  Oral Pain/Anxiety/Delirium protocol (if indicated): No VAP protocol (if indicated): Not indicated DVT prophylaxis: LMWH GI prophylaxis: N/A Glucose control:  SSI No Central venous access:  N/A Arterial line:  N/A Foley:  N/A Mobility:  bed rest  PT consulted: N/A Last date of multidisciplinary goals of care discussion [12/15] Code Status:  full code Disposition: Stepdown   = Goals of Care = Code Status Order: FULL  Primary Emergency Contact: Mccubbin,Jason, Home Phone: [phone] Wishes to pursue full aggressive treatment and intervention options, including CPR and intubation,  but goals of care will be addressed on going with family if that should become necessary.   Critical care provider statement:   Total critical care time: 33 minutes   Performed by: Karna Christmas MD   Critical care time was exclusive of separately billable  procedures and treating other patients.   Critical care was necessary to treat or prevent imminent or life-threatening deterioration.   Critical care was time spent personally by me on the following activities: development of treatment plan with patient and/or surrogate as well as nursing, discussions with consultants, evaluation of patient's response to treatment, examination of patient, obtaining history from patient or surrogate, ordering and performing treatments and interventions, ordering and review of laboratory studies, ordering and review of radiographic studies, pulse oximetry and re-evaluation of patient's condition.    Vida Rigger, M.D.  Pulmonary & Critical Care Medicine

## 2023-12-12 NOTE — Plan of Care (Signed)
°  Problem: Coping: °Goal: Level of anxiety will decrease °Outcome: Progressing °  °

## 2023-12-13 DIAGNOSIS — G032 Benign recurrent meningitis [Mollaret]: Secondary | ICD-10-CM | POA: Diagnosis not present

## 2023-12-13 DIAGNOSIS — G009 Bacterial meningitis, unspecified: Secondary | ICD-10-CM | POA: Diagnosis not present

## 2023-12-13 DIAGNOSIS — T50905A Adverse effect of unspecified drugs, medicaments and biological substances, initial encounter: Secondary | ICD-10-CM

## 2023-12-13 LAB — BASIC METABOLIC PANEL
Anion gap: 6 (ref 5–15)
BUN: <5 mg/dL — ABNORMAL LOW (ref 6–20)
CO2: 22 mmol/L (ref 22–32)
Calcium: 8.9 mg/dL (ref 8.9–10.3)
Chloride: 109 mmol/L (ref 98–111)
Creatinine, Ser: 0.85 mg/dL (ref 0.44–1.00)
GFR, Estimated: >60 mL/min (ref >60)
Glucose, Bld: 110 mg/dL — ABNORMAL HIGH (ref 70–99)
Potassium: 3.9 mmol/L (ref 3.5–5.1)
Sodium: 137 mmol/L (ref 135–145)

## 2023-12-13 LAB — PHOSPHORUS: Phosphorus: 4.1 mg/dL (ref 2.5–4.6)

## 2023-12-13 LAB — MAGNESIUM: Magnesium: 2.1 mg/dL (ref 1.7–2.4)

## 2023-12-13 LAB — HSV 1/2 PCR (SURFACE)
HSV-1 DNA: NOT DETECTED
HSV-2 DNA: NOT DETECTED

## 2023-12-13 MED ORDER — ACETAMINOPHEN 650 MG RE SUPP: 650.0000 mg | Freq: Four times a day (QID) | RECTAL | Status: DC | PRN

## 2023-12-13 MED ORDER — CALCIUM CARBONATE ANTACID 500 MG PO CHEW
1.0000 | CHEWABLE_TABLET | Freq: Two times a day (BID) | ORAL | Status: DC | PRN
  Administered 2023-12-13: 200 mg via ORAL
  Filled 2023-12-13: qty 1

## 2023-12-13 MED ORDER — GABAPENTIN 250 MG/5ML PO SOLN
250.0000 mg | Freq: Three times a day (TID) | ORAL | Status: DC
  Administered 2023-12-13 – 2023-12-15 (×6): 250 mg via ORAL
  Filled 2023-12-13 (×9): qty 5

## 2023-12-13 MED ORDER — ACETAMINOPHEN 325 MG PO TABS: 650.0000 mg | ORAL_TABLET | Freq: Four times a day (QID) | ORAL | Status: DC | PRN

## 2023-12-13 NOTE — Progress Notes (Signed)
Date of Admission:  12/10/2023      ID: [id number] KOSMAN is a 47 y.o. female  Principal Problem:   Benign recurrent aseptic meningitis Active Problems:   Migraine   Depression   Dyslipidemia   GERD without esophagitis   Aseptic meningitis due to drug    Subjective: Pout of ICU Doing better Some headache  Medications:   acyclovir ointment   Topical Q3H   vitamin C  500 mg Oral BID   Chlorhexidine Gluconate Cloth  6 each Topical Daily   cycloSPORINE  1 drop Both Eyes BID   feeding supplement  237 mL Oral BID BM   folic acid  2 mg Oral Daily   gabapentin  250 mg Oral TID   midodrine  10 mg Oral TID WC   mometasone-formoterol  2 puff Inhalation BID   multivitamin with minerals  1 tablet Oral Daily   naloxone  1 spray Nasal Once   pantoprazole  40 mg Oral Daily   polyethylene glycol  17 g Oral Daily   pravastatin  40 mg Oral QHS   topiramate  100 mg Oral Daily   valACYclovir  500 mg Oral BID    Objective: Vital signs in last 24 hours: Patient Vitals for the past 24 hrs:  BP Temp Temp src Pulse Resp SpO2  12/13/23 1134 97/62 98.3 F (36.8 C) -- 92 17 99 %  12/13/23 0838 125/71 97.6 F (36.4 C) -- 92 18 100 %  12/13/23 0327 108/76 98 F (36.7 C) Oral 82 18 98 %  12/12/23 2318 124/78 98.4 F (36.9 C) Oral 75 18 96 %  12/12/23 1934 130/87 98.4 F (36.9 C) Oral 75 18 98 %       PHYSICAL EXAM:  General: Alert, cooperative, no distress, appears stated age. Pale and chronically ill Head: Normocephalic, without obvious abnormality, atraumatic. Eyes: Conjunctivae clear, anicteric sclerae. Pupils are equal ENT Nares normal. No drainage or sinus tenderness. Oral cavity- ulcer on the roof of the mouth -site of previous biopsy Neck: Supple, symmetrical, no adenopathy, thyroid: non tender no carotid bruit and no JVD. Back: No CVA tenderness. Lungs: Clear to auscultation bilaterally. No Wheezing or Rhonchi. No rales. Heart: Regular rate and rhythm, no murmur, rub or  gallop. Abdomen: Soft, non-tender,not distended. Bowel sounds normal. No masses Extremities: atraumatic, no cyanosis. No edema. No clubbing Skin: small excoriation like wounds on her neck and upper back Lymph: Cervical, supraclavicular normal. Neurologic: Grossly non-focal  Lab Results    Latest Ref Rng & Units 12/12/2023    4:02 AM 12/11/2023    6:58 AM 12/10/2023    6:16 AM  CBC  WBC 4.0 - 10.5 K/uL 4.5  4.6  6.2   Hemoglobin 12.0 - 15.0 g/dL 16.1  9.1  09.6   Hematocrit 36.0 - 46.0 % 33.5  27.6  29.9   Platelets 150 - 400 K/uL 147  132  153        Latest Ref Rng & Units 12/13/2023    6:05 AM 12/12/2023    4:02 AM 12/11/2023    6:58 AM  CMP  Glucose 70 - 99 mg/dL 045  409  811   BUN 6 - 20 mg/dL <5  <5  <5   Creatinine 0.44 - 1.00 mg/dL 9.14  7.82  9.56   Sodium 135 - 145 mmol/L 137  138  140   Potassium 3.5 - 5.1 mmol/L 3.9  3.7  3.6   Chloride 98 - 111  mmol/L 109  109  110   CO2 22 - 32 mmol/L 22  22  22    Calcium 8.9 - 10.3 mg/dL 8.9  8.4  7.8       Microbiology: CSF culture NG so far ME panel neg Blood culture NG so far Studies/Results: No results found.    Assessment/Plan: ?pt presenting with fever and headache after 24 hrs of IVIG   Aseptic meningitis- with csf pleocytosis benign and recurrent ME panel neg- acyclovir, ceftriaxone and vanco discontinued     This is 2 d episode in 6 months In May 2024 similar presentation with fever and headache and Csf analysis was negative for bacterial or viral infection by a neg ME panel. Extensive ID work up negative in May-June 2024 ( ME panel, crypto, toxo, bartonella, qunat gold, HIV, RPR) Paraneoplastic work up in serum was negative It was thought that her aseptic meningitis picture was due to IVIG. She is at risk because of migraine and looks like she was dehydrated as well Subcutaneous  immunoglobulin may be better as it may not cause aseptic meninigits    After discharge she saw her AI specialist who did  not think it was due to IVIG-I sent a message to her    Aug 2024 she had repeat LP and csf was normal  she did not get IVIG for  6 months ( May- Oct). She received a dose on 11/8 and then on  12/08/23      Selective Immunoglobulin def- followed at [facility] normal immunoglobulin, CH50, respiratory burst, CD19, CD3, CD4, CD8, NK cells. Negative HIV, Hep B&C, normal tetanus and diptheria titers. Ms. Grandinetti was diagnosed with specific antibody deficiency based on failure to respond to pneumovax despite receiving vaccination 6 months prior."    ? H/o migraine   Oral candidiasis   Recent HSV infection Treated with 14 days of IV acyclovir  swab of  oral mucosa on the roof of the mouth wound negative for HSV  If HSV recurs on valtrex will need phenotypic assay/culture to look for acyclovir resistance    H/o uterine sarcoma- s/p hysterectomy, radiation  Discussed the management with patient and the hospitalist  ID will sign off- call if needed

## 2023-12-13 NOTE — Progress Notes (Signed)
PROGRESS NOTE    [name] Horn  ZOX:[id number] DOB: 1976/06/22 DOA: 12/10/2023 PCP: [facility]     Brief Narrative:  [name] Horn is a 47 y.o. female with medical history significant for antibody deficiency receiving IVIG monthly, rheumatoid arthritis, migraine, eosinophilic esophagitis, gastroparesis, DVT, IBD and aseptic meningitis for which she was admitted in April 2024, who presented to the emergency room with acute onset of fever and associated headache.  She was recently admitted to [facility] for disseminated herpes simplex 1 infection for a couple of weeks.  She had IVIG injection on Friday 12/13.  Early morning on Sunday 12/15, she started to have severe headaches, that was similar in presentation to her previous aseptic meningitis episode in April 2024.  Also associated nausea and vomiting as well as fevers.  She underwent LP. On 12/16 afternoon, patient became hypotensive, moved to ICU for Levophed.  CCM consulted. Patient was weaned off levophed 12/16 and transferred to progressive unit.  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  In-N-Out cath obtained 1725ml.  Patient has urology follow-up  scheduled outpatient    DVT prophylaxis: SCD Place and maintain sequential compression device Start: 12/11/23 1344  Code Status: Full Family Communication: None at bedside  Disposition Plan: Home Status is: Inpatient Remains inpatient appropriate because: Physical therapy evaluation today  Antimicrobials:  Anti-infectives (From admission, onward)    Start     Dose/Rate Route Frequency Ordered Stop   12/11/23 2300  valACYclovir (VALTREX) tablet 500 mg        500 mg Oral 2 times daily 12/11/23 1841     12/11/23 2000  vancomycin (VANCOCIN) IVPB 1000 mg/200 mL premix  Status:  Discontinued        1,000 mg 200 mL/hr over 60 Minutes Intravenous Every 12 hours 12/10/23 1243 12/11/23 1841   12/11/23 0000  vancomycin (VANCOREADY) IVPB 1750 mg/350 mL  Status:  Discontinued        1,750 mg 175 mL/hr over 120 Minutes Intravenous Every 24 hours 12/10/23 0543 12/10/23 1243   12/10/23 1800  cefTRIAXone (ROCEPHIN) 2 g in sodium chloride 0.9 % 100 mL IVPB  Status:  Discontinued        2 g 200 mL/hr over 30 Minutes Intravenous Every 12 hours 12/10/23 0526 12/11/23 1841   12/10/23 1800  acyclovir (ZOVIRAX) 660 mg in dextrose 5 % 250 mL IVPB  Status:  Discontinued        66 0 mg 263.2 mL/hr over 60 Minutes Intravenous Every 8 hours 12/10/23 1201 12/11/23 1841   12/10/23 1000  valACYclovir (VALTREX) tablet 1,000 mg  Status:  Discontinued        1,000 mg Oral Daily 12/10/23 0705 12/10/23 1105   12/10/23 0900  acyclovir (ZOVIRAX) 545 mg in dextrose 5 %  100 mL IVPB        10 mg/kg  54.7 kg (Ideal) 110.9 mL/hr over 60 Minutes Intravenous  Once 12/10/23 0453 12/10/23 1143   12/10/23 0700  acyclovir (ZOVIRAX) 545 mg in dextrose 5 % 100 mL IVPB  Status:  Discontinued        10 mg/kg  54.7 kg (Ideal) 110.9 mL/hr over 60 Minutes Intravenous  Once 12/10/23 0450 12/10/23 0453   12/10/23 0600  vancomycin (VANCOREADY) IVPB 1750 mg/350 mL  Status:  Discontinued        1,750 mg 175 mL/hr over 120 Minutes  Intravenous  Once 12/10/23 0436 12/10/23 0453   12/10/23 0600  vancomycin (VANCOCIN) 1,750 mg in sodium chloride 0.9 % 500 mL IVPB        1,750 mg 258.8 mL/hr over 120 Minutes Intravenous  Once 12/10/23 0454 12/10/23 0751   12/10/23 0530  vancomycin (VANCOCIN) IVPB 1000 mg/200 mL premix  Status:  Discontinued        1,000 mg 200 mL/hr over 60 Minutes Intravenous  Once 12/10/23 0526 12/10/23 0534   12/10/23 0445  cefTRIAXone (ROCEPHIN) 2 g in sodium chloride 0.9 % 100 mL IVPB        2 g 200 mL/hr over 30 Minutes Intravenous  Once 12/10/23 0436 12/10/23 0507        Objective: Vitals:   12/12/23 1934 12/12/23 2318 12/13/23 0327 12/13/23 0838  BP: 130/87 124/78 108/76 125/71  Pulse: 75 75 82 92  Resp: 18 18 18 18   Temp: 98.4 F (36.9 C) 98.4 F (36.9 C) 98 F (36.7 C) 97.6 F (36.4 C)  TempSrc: Oral Oral Oral   SpO2: 98% 96% 98% 100%  Weight:      Height:        Intake/Output Summary (Last 24 hours) at 12/13/2023 1112 Last data filed at 12/13/2023 0410 Gross per 24 hour  Intake 840 ml  Output 1975 ml  Net -1135 ml   Filed Weights   12/11/23 0155  Weight: 88 kg    Examination:  General exam: Appears calm and comfortable  Respiratory system: Respiratory effort normal. No respiratory distress. No conversational dyspnea.  Cardiovascular system: RRR Central nervous system: Alert and oriented. No focal neurological deficits. Speech clear.  Extremities: Symmetric in appearance  Psychiatry: Judgement and insight appear normal. Mood & affect appropriate.   Data Reviewed: I have personally reviewed following labs and imaging studies  CBC: Recent Labs  Lab 12/10/23 0105 12/10/23 0616 12/11/23 0658 12/12/23 0402  WBC 5.7 6.2 4.6 4.5  NEUTROABS 3.3  --   --   --   HGB 11.5* 10.1* 9.1* 10.8*  HCT 34.0* 29.9* 27.6* 33.5*  MCV 93.7 94.6 94.2 95.4  PLT 171 153 132* 147*   Basic Metabolic Panel: Recent Labs  Lab 12/10/23 0104 12/10/23 0105 12/10/23 0616  12/10/23 0958 12/11/23 0658 12/12/23 0402 12/13/23 0605  NA  --  139  --   --  140 138 137  K  --  2.6*  --  3.7 3.6 3.7 3.9  CL  --  99  --   --  110 109 109  CO2  --  28  --   --  22 22 22   GLUCOSE  --  117*  --   --  115* 106* 110*  BUN  --  <5*  --   --  <5* <5* <5*  CREATININE  --  0.88 0.88  --  0.76 0.70 0.85  CALCIUM  --  8.9  --   --  7.8* 8.4* 8.9  MG 1.3*  --   --   --  1.7 2.3 2.1  PHOS  --   --   --   --   --   --  4.1   GFR: Estimated Creatinine Clearance: 85.9 mL/min (by C-G formula based on SCr of 0.85 mg/dL). Liver Function Tests: Recent Labs  Lab 12/10/23 0105  AST 93*  ALT 78*  ALKPHOS 97  BILITOT 0.9  PROT 8.1  ALBUMIN 3.7   No results for input(s): "LIPASE", "AMYLASE" in the last 168 hours. No results for input(s): "AMMONIA" in the last 168 hours. Coagulation Profile: Recent Labs  Lab 12/10/23 0105 12/11/23 0658  INR 1.2 1.2   Cardiac Enzymes: No results for input(s): "CKTOTAL", "CKMB", "CKMBINDEX", "TROPONINI" in the last 168 hours. BNP (last 3 results) No results for input(s): "PROBNP" in the last 8760 hours. HbA1C: No results for input(s): "HGBA1C" in the last 72 hours. CBG: Recent Labs  Lab 12/11/23 0157  GLUCAP 102*   Lipid Profile: No results for input(s): "CHOL", "HDL", "LDLCALC", "TRIG", "CHOLHDL", "LDLDIRECT" in the last 72 hours. Thyroid Function Tests: No results for input(s): "TSH", "T4TOTAL", "FREET4", "T3FREE", "THYROIDAB" in the last 72 hours. Anemia Panel: No results for input(s): "VITAMINB12", "FOLATE", "FERRITIN", "TIBC", "IRON", "RETICCTPCT" in the last 72 hours. Sepsis Labs: Recent Labs  Lab 12/10/23 0105 12/10/23 1837 12/10/23 2142 12/11/23 0658  PROCALCITON <0.10  --   --  <0.10  LATICACIDVEN 1.8 1.6 1.1  --     Recent Results (from the past 240 hours)  Blood Culture (routine x 2)     Status: None (Preliminary result)   Collection Time: 12/10/23  1:05 AM   Specimen: BLOOD  Result Value Ref Range Status    Specimen Description BLOOD RIGHT ANTECUBITAL  Final   Special Requests   Final    BOTTLES DRAWN AEROBIC AND ANAEROBIC Blood Culture results may not be optimal due to an inadequate volume of blood received in culture bottles   Culture   Final    NO GROWTH 3 DAYS Performed at [facility], [address] [place], [place] [address]    Report Status PENDING  Incomplete  Blood Culture (routine x 2)     Status: None (Preliminary result)   Collection Time: 12/10/23  1:05 AM   Specimen: BLOOD  Result Value Ref Range Status   Specimen Description BLOOD BLOOD RIGHT FOREARM  Final   Special Requests   Final    BOTTLES DRAWN AEROBIC AND ANAEROBIC Blood Culture adequate volume   Culture   Final    NO GROWTH 3 DAYS Performed at [facility], [address], [place], [place] [address]    Report Status PENDING  Incomplete  CSF culture w Gram Stain     Status: None (Preliminary result)   Collection Time: 12/10/23 12:15 PM   Specimen: CSF; Cerebrospinal Fluid  Result Value Ref Range Status   Specimen Description   Final    CSF Performed at [facility], [address], [place], [place] [address]    Special Requests   Final    CSF Performed at [facility], [address] [place], [place] [address]    Gram Stain   Final    RED BLOOD CELLS WBC SEEN NO ORGANISMS SEEN Performed at [facility], [address], [place], [place] [address]    Culture   Final    NO GROWTH 2  DAYS Performed at [facility], [address] [place], [place] [address]    Report Status PENDING  Incomplete  MRSA Next Gen by PCR, Nasal     Status: None   Collection Time: 12/11/23  2:30 AM   Specimen: Nasal Mucosa; Nasal Swab  Result Value Ref Range Status   MRSA by PCR Next Gen NOT DETECTED NOT DETECTED Final    Comment: (NOTE) The GeneXpert MRSA Assay (FDA approved for NASAL specimens only), is one component of a comprehensive MRSA colonization  surveillance program. It is not intended to diagnose MRSA infection nor to guide or monitor treatment for MRSA infections. Test performance is not FDA approved in patients less than 70 years old. Performed at [facility], [address] [place], [place] [address]       Radiology Studies: IR LUMBAR PUNCTURE Result Date: 12/11/2023 CLINICAL DATA:  Sepsis and suspected IV IG induced aseptic meningitis. Rheumatoid arthritis. EXAM: DIAGNOSTIC LUMBAR PUNCTURE UNDER FLUOROSCOPIC GUIDANCE COMPARISON:  None Available. FLUOROSCOPY: Radiation Exposure Index (as provided by the fluoroscopic device): 9.0 mGy Kerma PROCEDURE: Informed consent was obtained from the patient prior to the procedure, including potential complications of headache, allergy, and pain. With the patient prone, the lower back was prepped with ChloraPrep. 1% Lidocaine was used for local anesthesia. Lumbar puncture was performed at the L4-5 level using a 20 gauge needle with return of slightly pink tinged CSF. 10 cc ml of CSF were obtained for laboratory studies. The patient tolerated the procedure well and there were no apparent complications. IMPRESSION: Successful L4-5 fluoroscopic lumbar puncture. Electronically Signed   By: Judie Petit.  Shick M.D.   On: 12/11/2023 13:24      Scheduled Meds:  acyclovir ointment   Topical Q3H   vitamin C  500 mg Oral BID   Chlorhexidine Gluconate Cloth  6 each Topical Daily   cycloSPORINE  1 drop Both Eyes BID   feeding supplement  237 mL Oral BID BM   folic acid  2 mg Oral Daily   gabapentin  250 mg Oral TID   midodrine  10 mg Oral TID WC   mometasone-formoterol  2 puff Inhalation BID   multivitamin with minerals  1 tablet Oral Daily   naloxone  1 spray Nasal Once   pantoprazole  40 mg Oral Daily   polyethylene glycol  17 g Oral Daily   pravastatin  40 mg Oral QHS   topiramate  100 mg Oral Daily   valACYclovir  500 mg Oral BID   Continuous Infusions:     LOS: 3 days   Time  spent: 25 minutes   Noralee Stain, DO [facility] 12/13/2023, 11:12 AM   Available via Epic secure chat 7am-7pm After these hours, please refer to coverage provider listed on [email or web address]

## 2023-12-13 NOTE — Progress Notes (Signed)
       CROSS COVER NOTE  NAME: [name] Horn MRN: [id number] DOB : [birth year]    Concern as stated by nurse / staff   Per RN patient has developed liquid diarrhea this evening and has history of C diff     Pertinent findings on chart review:   Assessment and  Interventions   Assessment:    12/13/2023    7:48 PM 12/13/2023   11:34 AM 12/13/2023    8:38 AM  Vitals with BMI  Systolic 117 97 125  Diastolic 82 62 71  Pulse 75 92 92   Afebrile Per patient she has developed this liquid diarrhea just this evening and has become distended. She confirms + C diff 3 times in the past with each episode being more difficult to treat  Plan: Per ID no fever or leukocytosis and received miralax for 2 days so hold testing for now       Donnie Mesa NP [facility] Cross Cover 7pm-7am - check amion for availability Pager [phone]

## 2023-12-13 NOTE — Progress Notes (Signed)
Date of Admission:  12/10/2023      ID: [name] Horn is a 47 y.o. female  Principal Problem:   Benign recurrent aseptic meningitis Active Problems:   Migraine   Depression   Dyslipidemia   GERD without esophagitis    Subjective: Pt c/o blister on her lower lip Headache better No fever  Medications:   acyclovir ointment   Topical Q3H   vitamin C  500 mg Oral BID   Chlorhexidine Gluconate Cloth  6 each Topical Daily   cycloSPORINE  1 drop Both Eyes BID   feeding supplement  237 mL Oral BID BM   folic acid  2 mg Oral Daily   gabapentin  250 mg Oral TID   midodrine  10 mg Oral TID WC   mometasone-formoterol  2 puff Inhalation BID   multivitamin with minerals  1 tablet Oral Daily   naloxone  1 spray Nasal Once   pantoprazole  40 mg Oral Daily   polyethylene glycol  17 g Oral Daily   pravastatin  40 mg Oral QHS   topiramate  100 mg Oral Daily   valACYclovir  500 mg Oral BID    Objective: Vital signs in last 24 hours: Patient Vitals for the past 24 hrs:  BP Temp Temp src Pulse Resp SpO2  12/13/23 0838 125/71 97.6 F (36.4 C) -- 92 18 100 %  12/13/23 0327 108/76 98 F (36.7 C) Oral 82 18 98 %  12/12/23 2318 124/78 98.4 F (36.9 C) Oral 75 18 96 %  12/12/23 1934 130/87 98.4 F (36.9 C) Oral 75 18 98 %  12/12/23 1712 100/87 98.3 F (36.8 C) -- 98 18 100 %  12/12/23 1600 110/69 -- -- 65 12 96 %  12/12/23 1500 117/68 -- -- 74 13 96 %  12/12/23 1400 102/61 -- -- 78 11 96 %  12/12/23 1314 -- -- -- 87 13 97 %  12/12/23 1300 107/63 -- -- 80 13 96 %  12/12/23 1200 115/88 -- -- 80 14 99 %  12/12/23 1138 -- -- -- 78 12 96 %  12/12/23 1100 112/80 -- -- 88 15 99 %  12/12/23 1000 105/68 98.8 F (37.1 C) Oral 71 10 94 %  12/12/23 0900 -- -- -- 72 12 98 %       PHYSICAL EXAM:  General: Alert, cooperative, no distress, appears stated age. Pale and chronically ill Head: Normocephalic, without obvious abnormality, atraumatic. Eyes: Conjunctivae clear, anicteric  sclerae. Pupils are equal ENT Nares normal. No drainage or sinus tenderness. Oral cavity- ulcer on the roof of the mouth -site of previous biopsy  Neck: Supple, symmetrical, no adenopathy, thyroid: non tender no carotid bruit and no JVD. Back: No CVA tenderness. Lungs: Clear to auscultation bilaterally. No Wheezing or Rhonchi. No rales. Heart: Regular rate and rhythm, no murmur, rub or gallop. Abdomen: Soft, non-tender,not distended. Bowel sounds normal. No masses Extremities: atraumatic, no cyanosis. No edema. No clubbing Skin: small excoriation like wounds on her neck and upper back Lymph: Cervical, supraclavicular normal. Neurologic: Grossly non-focal  Lab Results    Latest Ref Rng & Units 12/12/2023    4:02 AM 12/11/2023    6:58 AM 12/10/2023    6:16 AM  CBC  WBC 4.0 - 10.5 K/uL 4.5  4.6  6.2   Hemoglobin 12.0 - 15.0 g/dL 42.5  9.1  95.6   Hematocrit 36.0 - 46.0 % 33.5  27.6  29.9   Platelets 150 - 400 K/uL 147  132  153        Latest Ref Rng & Units 12/13/2023    6:05 AM 12/12/2023    4:02 AM 12/11/2023    6:58 AM  CMP  Glucose 70 - 99 mg/dL 161  096  045   BUN 6 - 20 mg/dL <5  <5  <5   Creatinine 0.44 - 1.00 mg/dL 4.09  8.11  9.14   Sodium 135 - 145 mmol/L 137  138  140   Potassium 3.5 - 5.1 mmol/L 3.9  3.7  3.6   Chloride 98 - 111 mmol/L 109  109  110   CO2 22 - 32 mmol/L 22  22  22    Calcium 8.9 - 10.3 mg/dL 8.9  8.4  7.8       Microbiology: CSF culture NG so far ME panel neg Blood culture NG so far Studies/Results: IR LUMBAR PUNCTURE Result Date: 12/11/2023 CLINICAL DATA:  Sepsis and suspected IV IG induced aseptic meningitis. Rheumatoid arthritis. EXAM: DIAGNOSTIC LUMBAR PUNCTURE UNDER FLUOROSCOPIC GUIDANCE COMPARISON:  None Available. FLUOROSCOPY: Radiation Exposure Index (as provided by the fluoroscopic device): 9.0 mGy Kerma PROCEDURE: Informed consent was obtained from the patient prior to the procedure, including potential complications of headache,  allergy, and pain. With the patient prone, the lower back was prepped with ChloraPrep. 1% Lidocaine was used for local anesthesia. Lumbar puncture was performed at the L4-5 level using a 20 gauge needle with return of slightly pink tinged CSF. 10 cc ml of CSF were obtained for laboratory studies. The patient tolerated the procedure well and there were no apparent complications. IMPRESSION: Successful L4-5 fluoroscopic lumbar puncture. Electronically Signed   By: Judie Petit.  Shick M.D.   On: 12/11/2023 13:24     Assessment/Plan: ?pt presenting with fever and headache after 24 hrs of IVIG   Aseptic meningitis- with csf pleocytosis benign and recurrent ME panel neg- acyclovir, ceftriaxone and vanco discontinued May consider neuro consult to get their input      This is 2 d episode in 6 months In May 2024 similar presentation with fever and headache and Csf analysis was negative for bacterial or viral infection by a neg ME panel. Extensive ID work up negative in May-June 2024 ( ME panel, crypto, toxo, bartonella, qunat gold, HIV, RPR) Paraneoplastic work up in serum was negative It was thought that her aseptic meningitis picture was due to IVIG.    After discharge she saw her AI specialist who did not think it was due to IVIG-    Aug 2024 she had repeat LP and csf was normal  she did not get IVIG for  6 months ( May- Oct). She received a dose on 11/8 and then on  12/08/23      Selective Immunoglobulin def- followed at [facility] normal immunoglobulin, CH50, respiratory burst, CD19, CD3, CD4, CD8, NK cells. Negative HIV, Hep B&C, normal tetanus and diptheria titers. Ms. Vaquero was diagnosed with specific antibody deficiency based on failure to respond to pneumovax despite receiving vaccination 6 months prior."    ? H/o migraine   Oral candidiasis   Recent HSV infection Treated with 14 days of IV acyclovir Biopsy from mouth( hard palate) and blister on the neck positive for HSV Will swab the oral  mucosa over te ulcer to see HSV is still present Would need phenotypic assay/culture to look for acyclovir resistance the DNA swab returns positive     H/o uterine sarcoma- s/p hysterectomy, radiation  Sent a message to er immunologist  at [facility]   Discussed the management with patient and her nurse

## 2023-12-13 NOTE — Evaluation (Signed)
Physical Therapy Evaluation Patient Details Name: [name] Horn MRN: [id number] DOB: 1976-07-30 Today's Date: 12/13/2023  History of Present Illness  47yoF whocomes to [facility] on 12/13 Pt reports h/a and fever that onset today. Reports received IVIG on Friday. States hx of meningitis after those infusions and that the pain and symptoms today feel the same. Reports light sensitivity and pain in her neck.  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 Pt able to try some longer distance AMB today, still pain limited, but no physical assist needs, no LOB. Pt elects to use RW for pain control. Will continue to follow. Pt educated on HHPT services and likely rapid transition to OPPT thereafter.       If plan is discharge home, recommend the following:     Can travel by private vehicle        Equipment Recommendations None recommended by PT  Recommendations for Other Services       Functional Status Assessment Patient has had a recent decline in their functional status and demonstrates the ability to make significant improvements in function in a reasonable and predictable amount of time.     Precautions / Restrictions Precautions Precautions: Fall Restrictions Weight Bearing Restrictions Per Provider Order: No      Mobility  Bed Mobility Overal bed mobility: Modified Independent                  Transfers Overall transfer level: Modified independent Equipment used: None                    Ambulation/Gait Ambulation/Gait assistance: Supervision Gait Distance (Feet): 160  Feet Assistive device: Rolling walker (2 wheels)   Gait velocity: 0.66m/s     General Gait Details: limited by pain in head/neck; takes 2 stop breaks at 50% and 75% of journey.  Stairs            Wheelchair Mobility     Tilt Bed    Modified Rankin (Stroke Patients Only)       Balance                                             Pertinent Vitals/Pain Pain Assessment Pain Assessment: 0-10 Pain Score: 7  Pain Location: neck/head pain Pain Descriptors / Indicators: Aching Pain Intervention(s): Limited activity within patient's tolerance, Monitored during session    Home Living Family/patient expects to be discharged to:: Private residence Living Arrangements: Spouse/significant other Available Help at Discharge: Family;Available 24 hours/day Type of Home: House Home Access: Stairs to enter Entrance Stairs-Rails: Doctor, general practice of Steps: 3   Home Layout: One level Home Equipment: Rollator (4 wheels);BSC/3in1;Shower seat - built in (from a friend (PRN use for situations out of house when prolonged standing)) Additional Comments: has sequencial compression devices for BLE lymphedema management; has bed with adjustable HOB/FOB, entry steps at bed.    Prior Function               Mobility Comments: typically household distance AMB, has difficulty with prolonged standing ADLs Comments: husband  helps with hair in shower; but  otherwise dresses and toilets without assist     Extremity/Trunk Assessment                Communication      Cognition                                                General Comments      Exercises     Assessment/Plan    PT Assessment Patient needs continued PT services  PT Problem List Decreased strength;Decreased range of motion;Decreased activity tolerance;Decreased balance       PT Treatment Interventions DME instruction;Gait training;Stair  training;Functional mobility training;Therapeutic activities;Therapeutic exercise;Balance training;Patient/family education    PT Goals (Current goals can be found in the Care Plan section)  Acute Rehab PT Goals Patient Stated Goal: pain control PT Goal Formulation: With patient Time For Goal Achievement: 12/27/23 Potential to Achieve Goals: Good    Frequency Min 1X/week     Co-evaluation               AM-PAC PT "6 Clicks" Mobility  Outcome Measure Help needed turning from your back to your side while in a flat bed without using bedrails?: A Little Help needed moving from lying on your back to sitting on the side of a flat bed without using bedrails?: A Little Help needed moving to and from a bed to a chair (including a wheelchair)?: A Little Help needed standing up from a chair using your arms (e.g., wheelchair or bedside chair)?: A Little Help needed to walk in hospital room?: A Little Help needed climbing 3-5 steps with a railing? : A Little 6 Click Score: 18    End of Session Equipment Utilized During Treatment: Gait belt Activity Tolerance: Patient tolerated treatment well;No increased pain Patient left: in bed;with call bell/phone within reach Nurse Communication: Mobility status PT Visit Diagnosis: Unsteadiness on feet (R26.81);Difficulty in walking, not elsewhere classified (R26.2);Other abnormalities of gait and mobility (R26.89)    Time: 1610-9604 PT Time Calculation (min) (ACUTE ONLY): 24 min   Charges:   PT Evaluation $PT Eval High Complexity: 1 High   PT General Charges $$ ACUTE PT VISIT: 1 Visit       12:50 PM, 12/13/23 Rosamaria Lints, PT, DPT Physical Therapist - [facility]  [phone] (ASCOM)    Julus Kelley C 12/13/2023, 12:47 PM

## 2023-12-13 NOTE — Progress Notes (Signed)
PHARMACY CONSULT NOTE - FOLLOW UP  Pharmacy Consult for Electrolyte Monitoring and Replacement   Recent Labs: Potassium (mmol/L)  Date Value  12/13/2023 3.9   Magnesium (mg/dL)  Date Value  28/41/3244 2.1   Calcium (mg/dL)  Date Value  12/28/7251 8.9   Calcium, Total (PTH) (mg/dL)  Date Value  66/44/0347 8.4 (L)   Albumin (g/dL)  Date Value  42/59/5638 3.7   Phosphorus (mg/dL)  Date Value  75/64/3329 4.1   Sodium (mmol/L)  Date Value  12/13/2023 137     Assessment: 47 y.o  female with complicated PMH including Malignant Uterine Sarcoma s/p total abdominal hysterectomy (TAH), urinary retention, chronic pyelonephritis, NSAT with a loop recorder, EOE, chronic cutaneous candidiasis, seropositive RA, tracheobronchomalacia, IgG subclass deficiency, keratoconjunctivitis sicca, and osteoarthritis  who presented to the ED with chief complaints of recurrent fevers post IVIG   Goal of Therapy:  Electrolytes WNL  Plan:  No replacement needed. Pt transferred from ICU. Pharmacy will sign off. Please re-consult if needed.   Ronnald Ramp ,PharmD Clinical Pharmacist 12/13/2023 7:49 AM

## 2023-12-14 DIAGNOSIS — G030 Nonpyogenic meningitis: Secondary | ICD-10-CM

## 2023-12-14 DIAGNOSIS — T50Z15A Adverse effect of immunoglobulin, initial encounter: Secondary | ICD-10-CM

## 2023-12-14 DIAGNOSIS — B370 Candidal stomatitis: Secondary | ICD-10-CM | POA: Diagnosis not present

## 2023-12-14 DIAGNOSIS — G032 Benign recurrent meningitis [Mollaret]: Secondary | ICD-10-CM | POA: Diagnosis not present

## 2023-12-14 DIAGNOSIS — R197 Diarrhea, unspecified: Secondary | ICD-10-CM

## 2023-12-14 LAB — RPR: RPR Ser Ql: NONREACTIVE

## 2023-12-14 LAB — C DIFFICILE (CDIFF) QUICK SCRN (NO PCR REFLEX)
C Diff antigen: NEGATIVE
C Diff interpretation: NOT DETECTED
C Diff toxin: NEGATIVE

## 2023-12-14 LAB — ANCA PROFILE
Anti-MPO Antibodies: <0.2 U (ref 0.0–0.9)
Anti-PR3 Antibodies: <0.2 U (ref 0.0–0.9)
Atypical P-ANCA titer: <1:20 {titer}
C-ANCA: <1:20 {titer}
P-ANCA: <1:20 {titer}

## 2023-12-14 LAB — CSF CULTURE W GRAM STAIN

## 2023-12-14 MED ORDER — HYDRALAZINE HCL 20 MG/ML IJ SOLN: 10.0000 mg | INTRAMUSCULAR | Status: DC | PRN

## 2023-12-14 MED ORDER — TRAZODONE HCL 50 MG PO TABS: 50.0000 mg | ORAL_TABLET | Freq: Every evening | ORAL | Status: DC | PRN

## 2023-12-14 MED ORDER — GUAIFENESIN 100 MG/5ML PO LIQD: 5.0000 mL | ORAL | Status: DC | PRN

## 2023-12-14 MED ORDER — SENNOSIDES-DOCUSATE SODIUM 8.6-50 MG PO TABS: 1.0000 | ORAL_TABLET | Freq: Every evening | ORAL | Status: DC | PRN

## 2023-12-14 MED ORDER — IPRATROPIUM-ALBUTEROL 0.5-2.5 (3) MG/3ML IN SOLN: 3.0000 mL | RESPIRATORY_TRACT | Status: DC | PRN

## 2023-12-14 MED ORDER — METOPROLOL TARTRATE 5 MG/5ML IV SOLN: 5.0000 mg | INTRAVENOUS | Status: DC | PRN

## 2023-12-14 NOTE — Progress Notes (Signed)
PROGRESS NOTE    [name] Horn  VWU:[id number] DOB: Nov 12, 1976 DOA: 12/10/2023 PCP: [facility]    Brief Narrative:  47 y.o. female with medical history significant for antibody deficiency receiving IVIG monthly, rheumatoid arthritis, migraine, eosinophilic esophagitis, gastroparesis, DVT, IBD and aseptic meningitis for which she was admitted in April 2024, who presented to the emergency room with acute onset of fever and associated headache.  She was recently admitted to [facility] for disseminated herpes simplex 1 infection for a couple of weeks.  She had IVIG injection on Friday 12/13.  Early morning on "Sunday 12/15, she started to have severe headaches, that was similar in presentation to her previous aseptic meningitis episode in April 2024.  Also associated nausea and vomiting as well as fevers.   She underwent LP. On 12/16 afternoon, patient became hypotensive, moved to ICU for Levophed.  CCM consulted. Patient was weaned off levophed 12/16 and transferred to progressive unit.   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  Negative ME panel at this time.  Paraneoplastic workup has also been negative.  Recent HSV infection treated with 14 days of IV acyclovir.  No further recommendations by ID at this time   Hypotension -Levophed off -Resolved    History of disseminated HSV -Zovirax --> Valtrex empirically -Prednisolone eyedrops   Dyslipidemia -Pravachol   Chronic pain syndrome -Uses oxycodone 15 mg every 4-6 hours   Acute urinary retention -Bladder scan revealed >999 mL.  In-N-Out cath obtained 1725ml.  Patient has urology follow-up scheduled  outpatient  Diarrhea - Received MiraLAX, ID recommending holding off on testing for C. difficile.  Does have history of C. difficile in the past.  No evidence of leukocytosis, procalcitonin negative.   PT/OT-home health; f2f done     DVT prophylaxis: SCD's Code Status: Full Family Communication: None at bedside  Disposition Plan: Home Status is: Inpatient Remains inpatient appropriate because: Hopefully home with home health in next 24-48 hours  Subjective: Seen and examined at bedside, no complaints this morning besides a mild headache at times. Does report of some diarrhea but no other complaints   Examination:  General exam: Appears calm and comfortable  Respiratory system: Clear to auscultation. Respiratory effort normal. Cardiovascular system: S1 & S2 heard, RRR. No JVD, murmurs, rubs, gallops or clicks. No pedal edema. Gastrointestinal system: Abdomen is nondistended, soft and nontender. No organomegaly or masses felt. Normal bowel sounds heard. Central nervous system: Alert and oriented. No focal neurological deficits. Extremities: Symmetric 5 x 5 power. Skin: No rashes, lesions or ulcers Psychiatry: Judgement and insight appear normal. Mood & affect appropriate.                Diet Orders (From admission, onward)     Start     Ordered   12/11/23 1233  Diet regular Room service appropriate? Yes; Fluid consistency: Thin  Diet effective now       Question Answer Comment  Room service appropriate? Yes   Fluid consistency: Thin      12" /16/24 1232            Objective: Vitals:   12/13/23 1948 12/13/23 2307 12/14/23 0318 12/14/23 0937  BP: 117/82 102/70  103/64 113/75  Pulse: 75 77 93 (!) 107  Resp: 18 18 18 15   Temp: 98.3 F (36.8 C) 98.2 F (36.8 C) 98.3 F (36.8 C) (!) 97 F (36.1 C)  TempSrc:      SpO2: 100% 97% 97% 97%  Weight:      Height:        Intake/Output Summary (Last 24 hours) at 12/14/2023 1311 Last data filed at 12/14/2023  0500 Gross per 24 hour  Intake 600 ml  Output --  Net 600 ml   Filed Weights   12/11/23 0155  Weight: 88 kg    Scheduled Meds:  acyclovir ointment   Topical Q3H   vitamin C  500 mg Oral BID   Chlorhexidine Gluconate Cloth  6 each Topical Daily   cycloSPORINE  1 drop Both Eyes BID   feeding supplement  237 mL Oral BID BM   folic acid  2 mg Oral Daily   gabapentin  250 mg Oral TID   midodrine  10 mg Oral TID WC   mometasone-formoterol  2 puff Inhalation BID   multivitamin with minerals  1 tablet Oral Daily   naloxone  1 spray Nasal Once   pantoprazole  40 mg Oral Daily   polyethylene glycol  17 g Oral Daily   pravastatin  40 mg Oral QHS   topiramate  100 mg Oral Daily   valACYclovir  500 mg Oral BID   Continuous Infusions:  Nutritional status Signs/Symptoms: per patient/family report   Body mass index is 33.3 kg/m.  Data Reviewed:   CBC: Recent Labs  Lab 12/10/23 0105 12/10/23 0616 12/11/23 0658 12/12/23 0402  WBC 5.7 6.2 4.6 4.5  NEUTROABS 3.3  --   --   --   HGB 11.5* 10.1* 9.1* 10.8*  HCT 34.0* 29.9* 27.6* 33.5*  MCV 93.7 94.6 94.2 95.4  PLT 171 153 132* 147*   Basic Metabolic Panel: Recent Labs  Lab 12/10/23 0104 12/10/23 0105 12/10/23 0616 12/10/23 0958 12/11/23 0658 12/12/23 0402 12/13/23 0605  NA  --  139  --   --  140 138 137  K  --  2.6*  --  3.7 3.6 3.7 3.9  CL  --  99  --   --  110 109 109  CO2  --  28  --   --  22 22 22   GLUCOSE  --  117*  --   --  115* 106* 110*  BUN  --  <5*  --   --  <5* <5* <5*  CREATININE  --  0.88 0.88  --  0.76 0.70 0.85  CALCIUM  --  8.9  --   --  7.8* 8.4* 8.9  MG 1.3*  --   --   --  1.7 2.3 2.1  PHOS  --   --   --   --   --   --  4.1   GFR: Estimated Creatinine Clearance: 85.9 mL/min (by C-G formula based on SCr of 0.85 mg/dL). Liver Function Tests: Recent Labs  Lab 12/10/23 0105  AST 93*  ALT 78*  ALKPHOS 97  BILITOT 0.9  PROT 8.1  ALBUMIN 3.7   No results for input(s): "LIPASE", "AMYLASE" in  the last 168 hours. No results for input(s): "AMMONIA" in the last 168 hours. Coagulation Profile: Recent Labs  Lab 12/10/23 0105 12/11/23 0658  INR 1.2 1.2   Cardiac Enzymes: No results for input(s): "CKTOTAL", "CKMB", "CKMBINDEX", "TROPONINI" in the last 168 hours. BNP (  last 3 results) No results for input(s): "PROBNP" in the last 8760 hours. HbA1C: No results for input(s): "HGBA1C" in the last 72 hours. CBG: Recent Labs  Lab 12/11/23 0157  GLUCAP 102*   Lipid Profile: No results for input(s): "CHOL", "HDL", "LDLCALC", "TRIG", "CHOLHDL", "LDLDIRECT" in the last 72 hours. Thyroid Function Tests: No results for input(s): "TSH", "T4TOTAL", "FREET4", "T3FREE", "THYROIDAB" in the last 72 hours. Anemia Panel: No results for input(s): "VITAMINB12", "FOLATE", "FERRITIN", "TIBC", "IRON", "RETICCTPCT" in the last 72 hours. Sepsis Labs: Recent Labs  Lab 12/10/23 0105 12/10/23 1837 12/10/23 2142 12/11/23 0658  PROCALCITON <0.10  --   --  <0.10  LATICACIDVEN 1.8 1.6 1.1  --     Recent Results (from the past 240 hours)  Blood Culture (routine x 2)     Status: None (Preliminary result)   Collection Time: 12/10/23  1:05 AM   Specimen: BLOOD  Result Value Ref Range Status   Specimen Description BLOOD RIGHT ANTECUBITAL  Final   Special Requests   Final    BOTTLES DRAWN AEROBIC AND ANAEROBIC Blood Culture results may not be optimal due to an inadequate volume of blood received in culture bottles   Culture   Final    NO GROWTH 4 DAYS Performed at [facility] [address] [place], [place] [address]    Report Status PENDING  Incomplete  Blood Culture (routine x 2)     Status: None (Preliminary result)   Collection Time: 12/10/23  1:05 AM   Specimen: BLOOD  Result Value Ref Range Status   Specimen Description BLOOD BLOOD RIGHT FOREARM  Final   Special Requests   Final    BOTTLES DRAWN AEROBIC AND ANAEROBIC Blood Culture adequate volume   Culture   Final    NO  GROWTH 4 DAYS Performed at [facility], [address] [place], [place] [address]    Report Status PENDING  Incomplete  CSF culture w Gram Stain     Status: None   Collection Time: 12/10/23 12:15 PM   Specimen: CSF; Cerebrospinal Fluid  Result Value Ref Range Status   Specimen Description   Final    CSF Performed at [facility], [address] [place], [place] [address]    Special Requests   Final    CSF Performed at [facility], [address] [place], [place] [address]    Gram Stain   Final    RED BLOOD CELLS WBC SEEN NO ORGANISMS SEEN Performed at [facility], [address], [place], [place] [address]    Culture   Final    NO GROWTH 3 DAYS Performed at [facility], [address] [place], [place] [address]    Report Status 12/14/2023 FINAL  Final  MRSA Next Gen by PCR, Nasal     Status: None   Collection Time: 12/11/23  2:30 AM   Specimen: Nasal Mucosa; Nasal Swab  Result Value Ref Range Status   MRSA by PCR Next Gen NOT DETECTED NOT DETECTED Final    Comment: (NOTE) The GeneXpert MRSA Assay (FDA approved for NASAL specimens only), is one component of a comprehensive MRSA colonization surveillance program. It is not intended to diagnose MRSA infection nor to guide or monitor treatment for MRSA infections. Test performance is not FDA approved in patients less than 41 years old. Performed at [facility], [address] [place], [place] [address]          Radiology Studies:  No results found.         LOS: 4 days   Time spent= 35 mins    Miguel Rota, MD [facility]  If 7PM-7AM, please contact night-coverage  12/14/2023, 1:11 PM

## 2023-12-14 NOTE — Hospital Course (Addendum)
Brief Narrative:  47 y.o. female with medical history significant for antibody deficiency receiving IVIG monthly, rheumatoid arthritis, migraine, eosinophilic esophagitis, gastroparesis, DVT, IBD and aseptic meningitis for which she was admitted in April 2024, who presented to the emergency room with acute onset of fever and associated headache.  She was recently admitted to [facility] for disseminated herpes simplex 1 infection for a couple of weeks.  She had IVIG injection on Friday 12/13.  Early morning on Sunday 12/15, she started to have severe headaches, that was similar in presentation to her previous aseptic meningitis episode in April 2024.  Also associated nausea and vomiting as well as fevers.   She underwent LP. On 12/16 afternoon, patient became hypotensive, moved to ICU for Levophed.  CCM consulted. Patient was weaned off levophed 12/16 and transferred to progressive unit. Eventually she was treated with 14 days of acyclovir did well in the hospital and was cleared for discharge.  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  Negative ME panel at this time.  Paraneoplastic workup has also been negative.  Recent HSV infection treated with 14 days of IV acyclovir.  No further recommendations by ID at this time   Hypotension -Levophed off -Resolved    History of disseminated HSV -Zovirax --> Valtrex empirically -Prednisolone eyedrops   Dyslipidemia -Pravachol   Chronic pain syndrome -Uses oxycodone 15 mg every 4-6 hours   Acute urinary retention -Bladder scan revealed >999 mL.  In-N-Out cath obtained .  Patient has urology follow-up scheduled outpatient  Diarrhea -C diff Is  neg   PT/OT-home health; f53f done     DVT prophylaxis: SCD's Code Status: Full Family Communication: None at bedside  Disposition Plan: Home Status is: Inpatient Remains inpatient appropriate because: Hopefully home with home health in next 24-48 hours  Subjective: Doing well today no complaints.   Examination:  General exam: Appears calm and comfortable  Respiratory system: Clear to auscultation. Respiratory effort normal. Cardiovascular system: S1 & S2 heard, RRR. No JVD, murmurs, rubs, gallops or clicks. No pedal edema. Gastrointestinal system: Abdomen is nondistended, soft and nontender. No organomegaly or masses felt. Normal bowel sounds heard. Central nervous system: Alert and oriented. No focal neurological deficits. Extremities: Symmetric 5 x 5 power. Skin: No rashes, lesions or ulcers Psychiatry: Judgement and insight appear normal. Mood & affect appropriate.

## 2023-12-14 NOTE — Progress Notes (Signed)
Date of Admission:  12/10/2023      ID: [name] Horn is a 47 y.o. female  Principal Problem:   Benign recurrent aseptic meningitis Active Problems:   Migraine   Depression   Dyslipidemia   GERD without esophagitis   Aseptic meningitis due to drug    Subjective: Pt had 3 loose stools yesterday Had received miralax on 12/16 and 12/17 So cidd test not done last night She c/o abdominal cramps and 3 more episodes of loose stool   Medications:   acyclovir ointment   Topical Q3H   vitamin C  500 mg Oral BID   Chlorhexidine Gluconate Cloth  6 each Topical Daily   cycloSPORINE  1 drop Both Eyes BID   feeding supplement  237 mL Oral BID BM   folic acid  2 mg Oral Daily   gabapentin  250 mg Oral TID   midodrine  10 mg Oral TID WC   mometasone-formoterol  2 puff Inhalation BID   multivitamin with minerals  1 tablet Oral Daily   naloxone  1 spray Nasal Once   pantoprazole  40 mg Oral Daily   polyethylene glycol  17 g Oral Daily   pravastatin  40 mg Oral QHS   topiramate  100 mg Oral Daily   valACYclovir  500 mg Oral BID    Objective: Vital signs in last 24 hours: Patient Vitals for the past 24 hrs:  BP Temp Pulse Resp SpO2  12/14/23 1351 113/64 99 F (37.2 C) 88 16 99 %  12/14/23 0937 113/75 (!) 97 F (36.1 C) (!) 107 15 97 %  12/14/23 0318 103/64 98.3 F (36.8 C) 93 18 97 %  12/13/23 2307 102/70 98.2 F (36.8 C) 77 18 97 %  12/13/23 1948 117/82 98.3 F (36.8 C) 75 18 100 %       PHYSICAL EXAM:  General: Alert, cooperative, no distress, appears stated age. Pale and chronically ill Head: Normocephalic, without obvious abnormality, atraumatic. Eyes: Conjunctivae clear, anicteric sclerae. Pupils are equal ENT Nares normal. No drainage or sinus tenderness. Oral cavity- ulcer on the roof of the mouth -site of previous biopsy Neck: Supple, symmetrical, no adenopathy, thyroid: non tender no carotid bruit and no JVD. Back: No CVA tenderness. Lungs: Clear to  auscultation bilaterally. No Wheezing or Rhonchi. No rales. Heart: Regular rate and rhythm, no murmur, rub or gallop. Abdomen: Soft, minimally distended. Bowel sounds normal. No masses Extremities: atraumatic, no cyanosis. No edema. No clubbing Skin: small excoriation like wounds on her neck and upper back Lymph: Cervical, supraclavicular normal. Neurologic: Grossly non-focal  Lab Results    Latest Ref Rng & Units 12/12/2023    4:02 AM 12/11/2023    6:58 AM 12/10/2023    6:16 AM  CBC  WBC 4.0 - 10.5 K/uL 4.5  4.6  6.2   Hemoglobin 12.0 - 15.0 g/dL 63.8  9.1  75.6   Hematocrit 36.0 - 46.0 % 33.5  27.6  29.9   Platelets 150 - 400 K/uL 147  132  153        Latest Ref Rng & Units 12/13/2023    6:05 AM 12/12/2023    4:02 AM 12/11/2023    6:58 AM  CMP  Glucose 70 - 99 mg/dL 433  295  188   BUN 6 - 20 mg/dL <5  <5  <5   Creatinine 0.44 - 1.00 mg/dL 4.16  6.06  3.01   Sodium 135 - 145 mmol/L 137  138  140   Potassium 3.5 - 5.1 mmol/L 3.9  3.7  3.6   Chloride 98 - 111 mmol/L 109  109  110   CO2 22 - 32 mmol/L 22  22  22    Calcium 8.9 - 10.3 mg/dL 8.9  8.4  7.8       Microbiology: CSF culture NG so far ME panel neg Blood culture NG so far RPR NR  ANCA neg   Assessment/Plan: ?pt presenting with fever and headache after 24 hrs of IVIG   Aseptic meningitis- with csf pleocytosis benign and recurrent ME panel neg- acyclovir, ceftriaxone and vanco discontinued     This is 2 d episode in 6 months In May 2024 similar presentation with fever and headache and Csf analysis was negative for bacterial or viral infection by a neg ME panel. Extensive ID work up negative in May-June 2024 ( ME panel, crypto, toxo, bartonella, qunat gold, HIV, RPR) Paraneoplastic work up in serum was negative It was thought that her aseptic meningitis picture was due to IVIG. She is at risk because of migraine and looks like she was dehydrated as well Subcutaneous  immunoglobulin may be better as it may  not cause aseptic meninigits    After discharge she saw her AI specialist who did not think it was due to IVIG-I sent a message to her    Aug 2024 she had repeat LP and csf was normal  she did not get IVIG for  6 months ( May- Oct). She received a dose on 11/8 and then on  12/08/23      Selective Immunoglobulin def- followed at [facility] normal immunoglobulin, CH50, respiratory burst, CD19, CD3, CD4, CD8, NK cells. Negative HIV, Hep B&C, normal tetanus and diptheria titers. [name] Horn was diagnosed with specific antibody deficiency based on failure to respond to pneumovax despite receiving vaccination 6 months prior."    ?  New diarrhea-cdiff negative   H/o migraine   Oral candidiasis   Recent HSV infection Treated with 14 days of IV acyclovir  swab of  oral mucosa on the roof of the mouth wound negative for HSV  If HSV recurs on valtrex will need phenotypic assay/culture to look for acyclovir resistance    H/o uterine sarcoma- s/p hysterectomy, radiation  Discussed the management with patient and the hospitalist  ID will sign off- call if needed

## 2023-12-15 DIAGNOSIS — G032 Benign recurrent meningitis [Mollaret]: Secondary | ICD-10-CM | POA: Diagnosis not present

## 2023-12-15 LAB — ANTINUCLEAR ANTIBODIES, IFA: ANA Ab, IFA: NEGATIVE

## 2023-12-15 LAB — BASIC METABOLIC PANEL
Anion gap: 9 (ref 5–15)
BUN: 6 mg/dL (ref 6–20)
CO2: 24 mmol/L (ref 22–32)
Calcium: 9.2 mg/dL (ref 8.9–10.3)
Chloride: 102 mmol/L (ref 98–111)
Creatinine, Ser: 0.87 mg/dL (ref 0.44–1.00)
GFR, Estimated: >60 mL/min (ref >60)
Glucose, Bld: 105 mg/dL — ABNORMAL HIGH (ref 70–99)
Potassium: 4.4 mmol/L (ref 3.5–5.1)
Sodium: 135 mmol/L (ref 135–145)

## 2023-12-15 LAB — CULTURE, BLOOD (ROUTINE X 2)
Culture: NO GROWTH
Culture: NO GROWTH
Special Requests: ADEQUATE

## 2023-12-15 LAB — MAGNESIUM: Magnesium: 2.1 mg/dL (ref 1.7–2.4)

## 2023-12-15 MED ORDER — MIDODRINE HCL 10 MG PO TABS: 10.0000 mg | ORAL_TABLET | Freq: Three times a day (TID) | ORAL | 0 refills | Status: DC

## 2023-12-15 NOTE — Progress Notes (Signed)
PT Cancellation Note  Patient Details Name: SAANA PATTEN MRN: [id number] DOB: 20-Jan-1976   Cancelled Treatment:    Reason Eval/Treat Not Completed: Other (comment) (Author stopped by room to attempt treatment. Pt reoprts preparing for DC now. Pain improving. Mobility successful ad lib. Will start OPPT in 1-2 weeks.) Will defer PT at this time, allow for transition to home.   1:47 PM, 12/15/23 Rosamaria Lints, PT, DPT Physical Therapist - [facility]  Outpatient Physical Therapy- Main Campus [phone]     Rynell Ciotti C 12/15/2023, 1:47 PM

## 2023-12-15 NOTE — Discharge Summary (Signed)
Physician Discharge Summary  ALLURA SALAMEH WJX:[id number] DOB: 1976-04-05 DOA: 12/10/2023  PCP: [facility]  Admit date: 12/10/2023 Discharge date: 12/15/2023  Admitted From: Home Disposition: Home  Recommendations for Outpatient Follow-up:  Follow up with PCP in 1-2 weeks Please obtain BMP/CBC in one week your next doctors visit.   Discharge Condition: Stable CODE STATUS: Full code Diet recommendation: Regular  Brief/Interim Summary: Brief Narrative:  47 y.o. female with medical history significant for antibody deficiency receiving IVIG monthly, rheumatoid arthritis, migraine, eosinophilic esophagitis, gastroparesis, DVT, IBD and aseptic meningitis for which she was admitted in April 2024, who presented to the emergency room with acute onset of fever and associated headache.  She was recently admitted to [facility] for disseminated herpes simplex 1 infection for a couple of weeks.  She had IVIG injection on Friday 12/13.  Early morning on Sunday 12/15, she started to have severe headaches, that was similar in presentation to her previous aseptic meningitis episode in April 2024.  Also associated nausea and vomiting as well as fevers.   She underwent LP. On 12/16 afternoon, patient became hypotensive, moved to ICU for Levophed.  CCM consulted. Patient was weaned off levophed 12/16 and transferred to progressive unit. Eventually she was treated with 14 days of acyclovir did well in the hospital and was cleared for discharge.  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  Negative ME panel at this time.  Paraneoplastic workup has also been negative.  Recent HSV infection treated with  14 days of IV acyclovir.  No further recommendations by ID at this time   Hypotension -Levophed off -Resolved    History of disseminated HSV -Zovirax --> Valtrex empirically -Prednisolone eyedrops   Dyslipidemia -Pravachol   Chronic pain syndrome -Uses oxycodone 15 mg every 4-6 hours   Acute urinary retention -Bladder scan revealed >999 mL.  In-N-Out cath obtained .  Patient has urology follow-up scheduled outpatient  Diarrhea -C diff Is neg   PT/OT-home health; f63f done     DVT prophylaxis: SCD's Code Status: Full Family Communication: None at bedside  Disposition Plan: Home Status is: Inpatient Remains inpatient appropriate because: Hopefully home with home health in next 24-48 hours  Subjective: Doing well today no complaints.   Examination:  General exam: Appears calm and comfortable  Respiratory system: Clear to auscultation. Respiratory effort normal. Cardiovascular system: S1 & S2 heard, RRR. No JVD, murmurs, rubs, gallops or clicks. No pedal edema. Gastrointestinal system: Abdomen is nondistended, soft and nontender. No organomegaly or masses felt. Normal bowel sounds heard. Central nervous system: Alert and oriented. No focal neurological deficits. Extremities: Symmetric 5 x 5 power. Skin: No rashes, lesions or ulcers Psychiatry: Judgement and insight appear normal. Mood & affect appropriate.    Discharge Diagnoses:  Principal Problem:   Benign recurrent aseptic meningitis Active Problems:   Migraine   Depression   Dyslipidemia   GERD without esophagitis   Aseptic meningitis due to drug        Discharge Exam: Vitals:   12/15/23 0857 12/15/23 1144  BP: (!) 100/54 97/67  Pulse: 93 89  Resp: 20 20  Temp: 98.4 F (36.9 C) 98.3 F (36.8 C)  SpO2: 98% 97%  Vitals:   12/14/23 2343 12/15/23 0410 12/15/23 0857 12/15/23 1144  BP: (!) 100/56 92/62 (!) 100/54 97/67  Pulse: 80 79 93 89  Resp: 17 17 20 20   Temp: 98.3 F (36.8 C) 97.9  F (36.6 C) 98.4 F (36.9 C) 98.3 F (36.8 C)  TempSrc:    Oral  SpO2: 99% 96% 98% 97%  Weight:      Height:         Discharge Instructions   Allergies as of 12/15/2023       Reactions   Cyanoacrylate Hives   Other Itching, Rash   Dermabond surgical glue. Dermabond surgical glue-blisters   Sulfa Antibiotics Anaphylaxis, Rash, Shortness Of Breath, Swelling   Angioedema (also)   Sulfonamide Derivatives Hives, Shortness Of Breath, Swelling   TONGUE SWELLS   Benadryl [diphenhydramine Hcl] Other (See Comments)   Hyperactivity   Sulfamethoxazole    Other Reaction(s): Angioedema   Diphenhydramine    Diphenhydramine Hcl    Other Reaction(s): Other (See Comments) HYPERACTIVITY, RESTLESS LEG   Silicone Rash   Watch band        Medication List     TAKE these medications    albuterol (2.5 MG/3ML) 0.083% nebulizer solution Commonly known as: PROVENTIL Take 3 mLs (2.5 mg total) by nebulization every 4 (four) hours as needed for wheezing or shortness of breath.   albuterol 108 (90 Base) MCG/ACT inhaler Commonly known as: VENTOLIN HFA Inhale 1 puff into the lungs every 4 (four) hours as needed.   amphetamine-dextroamphetamine 20 MG tablet Commonly known as: ADDERALL Take 20 mg by mouth 3 (three) times daily.   budesonide-formoterol 80-4.5 MCG/ACT inhaler Commonly known as: Symbicort Inhale 2 puffs into the lungs 2 (two) times daily. Can also take Q4 hr as needed for wheezing   cycloSPORINE 0.05 % ophthalmic emulsion Commonly known as: RESTASIS Place 1 drop into both eyes 2 (two) times daily.   dicyclomine 10 MG capsule Commonly known as: BENTYL Take 10 mg by mouth daily as needed for nausea/vomiting.   famotidine 40 MG tablet Commonly known as: PEPCID Take 40 mg by mouth at bedtime.   folic acid 1 MG tablet Commonly known as: FOLVITE Take 2 tablets (2 mg total) by mouth daily. What changed: how much to take   furosemide 20 MG tablet Commonly known as:  LASIX Take 60 mg by mouth 2 (two) times daily as needed for fluid or edema.   gabapentin 250 MG/5ML solution Commonly known as: NEURONTIN Take by mouth 3 (three) times daily.   hydrOXYzine 25 MG tablet Commonly known as: ATARAX Take 1 tablet (25 mg total) by mouth 2 (two) times daily. What changed:  when to take this reasons to take this   lactulose 10 GM/15ML solution Commonly known as: CHRONULAC Take 30 mLs (20 g total) by mouth daily as needed for mild constipation.   midodrine 10 MG tablet Commonly known as: PROAMATINE Take 1 tablet (10 mg total) by mouth 3 (three) times daily with meals.   naloxone 4 MG/0.1ML Liqd nasal spray kit Commonly known as: NARCAN Place 1 spray into the nose once.   nystatin 100000 UNIT/ML suspension Commonly known as: MYCOSTATIN Take 10 mLs (1,000,000 Units total) by mouth 3 (three) times daily. What changed:  when to take this reasons to take this   nystatin powder Apply 1 application topically 2 (two) times daily as needed (rash).   ondansetron 8 MG disintegrating tablet Commonly known as: ZOFRAN-ODT Take 1 tablet (8 mg total) by  mouth every 8 (eight) hours as needed.   oxyCODONE 15 MG immediate release tablet Commonly known as: ROXICODONE Take 15 mg by mouth every 4 (four) hours as needed.   pantoprazole 40 MG tablet Commonly known as: Protonix Take 1 tablet (40 mg total) by mouth daily. What changed: when to take this   Polyethyl Glycol-Propyl Glycol 0.4-0.3 % Gel ophthalmic gel Commonly known as: SYSTANE Place 1 application  into both eyes 2 (two) times daily as needed (Eye dryness).   Systane 0.4-0.3 % Soln Generic drug: Polyethyl Glycol-Propyl Glycol Apply 1 drop to eye in the morning, at noon, and at bedtime.   polyethylene glycol 17 g packet Commonly known as: MIRALAX / GLYCOLAX Take 17 g by mouth daily. What changed:  when to take this reasons to take this   Potassium Chloride ER 20 MEQ Tbcr Take 20 mEq by mouth  daily as needed (when taking furosemide).   pravastatin 40 MG tablet Commonly known as: PRAVACHOL Take 40 mg by mouth at bedtime.   prednisoLONE acetate 1 % ophthalmic suspension Commonly known as: PRED FORTE Place 1 drop into the left eye as needed (conjunctivitis).   Privigen 40 GM/400ML Soln Generic drug: Immune Globulin 10% Inject 40 g into the vein every 28 (twenty-eight) days.   prochlorperazine 10 MG tablet Commonly known as: COMPAZINE Take 1 tablet (10 mg total) by mouth every 6 (six) hours as needed for nausea or vomiting.   promethazine 25 MG tablet Commonly known as: PHENERGAN Take 1 tablet (25 mg total) by mouth every 6 (six) hours as needed for nausea or vomiting.   psyllium 95 % Pack Commonly known as: HYDROCIL/METAMUCIL Take 1 packet by mouth daily. What changed:  when to take this reasons to take this   rizatriptan 10 MG disintegrating tablet Commonly known as: MAXALT-MLT Take 10 mg by mouth daily as needed for migraine.   tiZANidine 2 MG tablet Commonly known as: ZANAFLEX Take 2 mg by mouth 3 (three) times daily.   topiramate 25 MG tablet Commonly known as: TOPAMAX Take 25 mg by mouth at bedtime.   topiramate 100 MG tablet Commonly known as: TOPAMAX Take 100 mg by mouth daily.   triamcinolone lotion 0.1 % Commonly known as: KENALOG Apply 1 application topically daily as needed (rash).   valACYclovir 1000 MG tablet Commonly known as: VALTREX Take 1 tablet (1,000 mg total) by mouth daily. What changed:  how much to take when to take this        Follow-up Information     [facility] Follow up in 1 week(s).   Contact information: [address] [place] [address] [phone]                Allergies  Allergen Reactions   Cyanoacrylate Hives   Other Itching and Rash    Dermabond surgical glue.  Dermabond surgical glue-blisters   Sulfa Antibiotics Anaphylaxis, Rash, Shortness Of Breath and Swelling     Angioedema (also)   Sulfonamide Derivatives Hives, Shortness Of Breath and Swelling    TONGUE SWELLS   Benadryl [Diphenhydramine Hcl] Other (See Comments)    Hyperactivity    Sulfamethoxazole     Other Reaction(s): Angioedema   Diphenhydramine    Diphenhydramine Hcl     Other Reaction(s): Other (See Comments)  HYPERACTIVITY, RESTLESS LEG   Silicone Rash    Watch band    You were cared for by a hospitalist during your hospital stay. If you have any questions about  your discharge medications or the care you received while you were in the hospital after you are discharged, you can call the unit and asked to speak with the hospitalist on call if the hospitalist that took care of you is not available. Once you are discharged, your primary care physician will handle any further medical issues. Please note that no refills for any discharge medications will be authorized once you are discharged, as it is imperative that you return to your primary care physician (or establish a relationship with a primary care physician if you do not have one) for your aftercare needs so that they can reassess your need for medications and monitor your lab values.  You were cared for by a hospitalist during your hospital stay. If you have any questions about your discharge medications or the care you received while you were in the hospital after you are discharged, you can call the unit and asked to speak with the hospitalist on call if the hospitalist that took care of you is not available. Once you are discharged, your primary care physician will handle any further medical issues. Please note that NO REFILLS for any discharge medications will be authorized once you are discharged, as it is imperative that you return to your primary care physician (or establish a relationship with a primary care physician if you do not have one) for your aftercare needs so that they can reassess your need for medications and monitor your  lab values.  Please request your Prim.MD to go over all Hospital Tests and Procedure/Radiological results at the follow up, please get all Hospital records sent to your Prim MD by signing hospital release before you go home.  Get CBC, CMP, 2 view Chest X ray checked  by Primary MD during your next visit or SNF MD in 5-7 days ( we routinely change or add medications that can affect your baseline labs and fluid status, therefore we recommend that you get the mentioned basic workup next visit with your PCP, your PCP may decide not to get them or add new tests based on their clinical decision)  On your next visit with your primary care physician please Get Medicines reviewed and adjusted.  If you experience worsening of your admission symptoms, develop shortness of breath, life threatening emergency, suicidal or homicidal thoughts you must seek medical attention immediately by calling 911 or calling your MD immediately  if symptoms less severe.  You Must read complete instructions/literature along with all the possible adverse reactions/side effects for all the Medicines you take and that have been prescribed to you. Take any new Medicines after you have completely understood and accpet all the possible adverse reactions/side effects.   Do not drive, operate heavy machinery, perform activities at heights, swimming or participation in water activities or provide baby sitting services if your were admitted for syncope or siezures until you have seen by Primary MD or a Neurologist and advised to do so again.  Do not drive when taking Pain medications.   Procedures/Studies: IR LUMBAR PUNCTURE Result Date: 12/11/2023 CLINICAL DATA:  Sepsis and suspected IV IG induced aseptic meningitis. Rheumatoid arthritis. EXAM: DIAGNOSTIC LUMBAR PUNCTURE UNDER FLUOROSCOPIC GUIDANCE COMPARISON:  None Available. FLUOROSCOPY: Radiation Exposure Index (as provided by the fluoroscopic device): 9.0 mGy Kerma PROCEDURE:  Informed consent was obtained from the patient prior to the procedure, including potential complications of headache, allergy, and pain. With the patient prone, the lower back was prepped with ChloraPrep. 1% Lidocaine was used for  local anesthesia. Lumbar puncture was performed at the L4-5 level using a 20 gauge needle with return of slightly pink tinged CSF. 10 cc ml of CSF were obtained for laboratory studies. The patient tolerated the procedure well and there were no apparent complications. IMPRESSION: Successful L4-5 fluoroscopic lumbar puncture. Electronically Signed   By: Judie Petit.  Shick M.D.   On: 12/11/2023 13:24   DG Chest Port 1 View Result Date: 12/10/2023 CLINICAL DATA:  Sepsis EXAM: PORTABLE CHEST 1 VIEW COMPARISON:  08/25/2023 FINDINGS: The heart size and mediastinal contours are within normal limits. Noted. Both lungs are clear. The visualized skeletal structures are unremarkable. The implanted loop recorder IMPRESSION: No active disease. Electronically Signed   By: Helyn Numbers M.D.   On: 12/10/2023 01:08     The results of significant diagnostics from this hospitalization (including imaging, microbiology, ancillary and laboratory) are listed below for reference.     Microbiology: Recent Results (from the past 240 hours)  Blood Culture (routine x 2)     Status: None   Collection Time: 12/10/23  1:05 AM   Specimen: BLOOD  Result Value Ref Range Status   Specimen Description BLOOD RIGHT ANTECUBITAL  Final   Special Requests   Final    BOTTLES DRAWN AEROBIC AND ANAEROBIC Blood Culture results may not be optimal due to an inadequate volume of blood received in culture bottles   Culture   Final    NO GROWTH 5 DAYS Performed at [facility], [address], [place], [place] [address]    Report Status 12/15/2023 FINAL  Final  Blood Culture (routine x 2)     Status: None   Collection Time: 12/10/23  1:05 AM   Specimen: BLOOD  Result Value Ref Range Status   Specimen  Description BLOOD BLOOD RIGHT FOREARM  Final   Special Requests   Final    BOTTLES DRAWN AEROBIC AND ANAEROBIC Blood Culture adequate volume   Culture   Final    NO GROWTH 5 DAYS Performed at [facility], [address], [place], [place] [address]    Report Status 12/15/2023 FINAL  Final  CSF culture w Gram Stain     Status: None   Collection Time: 12/10/23 12:15 PM   Specimen: CSF; Cerebrospinal Fluid  Result Value Ref Range Status   Specimen Description   Final    CSF Performed at [facility], [address], [place], [place] [address]    Special Requests   Final    CSF Performed at [facility], [address] [place], [place] [address]    Gram Stain   Final    RED BLOOD CELLS WBC SEEN NO ORGANISMS SEEN Performed at [facility], [address] [place], [place] [address]    Culture   Final    NO GROWTH 3 DAYS Performed at [facility], [address] [place], [place] [address]    Report Status 12/14/2023 FINAL  Final  MRSA Next Gen by PCR, Nasal     Status: None   Collection Time: 12/11/23  2:30 AM   Specimen: Nasal Mucosa; Nasal Swab  Result Value Ref Range Status   MRSA by PCR Next Gen NOT DETECTED NOT DETECTED Final    Comment: (NOTE) The GeneXpert MRSA Assay (FDA approved for NASAL specimens only), is one component of a comprehensive MRSA colonization surveillance program. It is not intended to diagnose MRSA infection nor to guide or monitor treatment for MRSA infections. Test performance  is not FDA approved in patients less than 38 years old. Performed at [facility], [address] [place], [place] [address]   C Difficile Quick Screen (NO PCR Reflex)     Status: None   Collection Time: 12/14/23  5:52 PM   Specimen: STOOL  Result Value Ref Range Status   C Diff antigen NEGATIVE NEGATIVE Final   C Diff toxin NEGATIVE NEGATIVE Final   C Diff interpretation No C. difficile detected.  Final     Comment: Performed at [facility], [address] [place], [place] [address]     Labs: BNP (last 3 results) No results for input(s): "BNP" in the last 8760 hours. Basic Metabolic Panel: Recent Labs  Lab 12/10/23 0104 12/10/23 0105 12/10/23 0105 12/10/23 8469 12/10/23 0958 12/11/23 0658 12/12/23 0402 12/13/23 0605 12/15/23 0656  NA  --  139  --   --   --  140 138 137 135  K  --  2.6*   < >  --  3.7 3.6 3.7 3.9 4.4  CL  --  99  --   --   --  110 109 109 102  CO2  --  28  --   --   --  22 22 22 24   GLUCOSE  --  117*  --   --   --  115* 106* 110* 105*  BUN  --  <5*  --   --   --  <5* <5* <5* 6  CREATININE  --  0.88   < > 0.88  --  0.76 0.70 0.85 0.87  CALCIUM  --  8.9  --   --   --  7.8* 8.4* 8.9 9.2  MG 1.3*  --   --   --   --  1.7 2.3 2.1 2.1  PHOS  --   --   --   --   --   --   --  4.1  --    < > = values in this interval not displayed.   Liver Function Tests: Recent Labs  Lab 12/10/23 0105  AST 93*  ALT 78*  ALKPHOS 97  BILITOT 0.9  PROT 8.1  ALBUMIN 3.7   No results for input(s): "LIPASE", "AMYLASE" in the last 168 hours. No results for input(s): "AMMONIA" in the last 168 hours. CBC: Recent Labs  Lab 12/10/23 0105 12/10/23 0616 12/11/23 0658 12/12/23 0402  WBC 5.7 6.2 4.6 4.5  NEUTROABS 3.3  --   --   --   HGB 11.5* 10.1* 9.1* 10.8*  HCT 34.0* 29.9* 27.6* 33.5*  MCV 93.7 94.6 94.2 95.4  PLT 171 153 132* 147*   Cardiac Enzymes: No results for input(s): "CKTOTAL", "CKMB", "CKMBINDEX", "TROPONINI" in the last 168 hours. BNP: Invalid input(s): "POCBNP" CBG: Recent Labs  Lab 12/11/23 0157  GLUCAP 102*   D-Dimer No results for input(s): "DDIMER" in the last 72 hours. Hgb A1c No results for input(s): "HGBA1C" in the last 72 hours. Lipid Profile No results for input(s): "CHOL", "HDL", "LDLCALC", "TRIG", "CHOLHDL", "LDLDIRECT" in the last 72 hours. Thyroid function studies No results for input(s): "TSH", "T4TOTAL", "T3FREE", "THYROIDAB" in  the last 72 hours.  Invalid input(s): "FREET3" Anemia work up No results for input(s): "VITAMINB12", "FOLATE", "FERRITIN", "TIBC", "IRON", "RETICCTPCT" in the last 72 hours. Urinalysis    Component Value Date/Time   COLORURINE YELLOW (A) 12/10/2023 1047   APPEARANCEUR CLEAR (A) 12/10/2023 1047   LABSPEC 1.013 12/10/2023 1047   PHURINE  8.0 12/10/2023 1047   GLUCOSEU NEGATIVE 12/10/2023 1047   HGBUR NEGATIVE 12/10/2023 1047   HGBUR negative 07/25/2007 1131   BILIRUBINUR NEGATIVE 12/10/2023 1047   KETONESUR NEGATIVE 12/10/2023 1047   PROTEINUR NEGATIVE 12/10/2023 1047   UROBILINOGEN 1.0 05/27/2009 1500   NITRITE NEGATIVE 12/10/2023 1047   LEUKOCYTESUR NEGATIVE 12/10/2023 1047   Sepsis Labs Recent Labs  Lab 12/10/23 0105 12/10/23 0616 12/11/23 0658 12/12/23 0402  WBC 5.7 6.2 4.6 4.5   Microbiology Recent Results (from the past 240 hours)  Blood Culture (routine x 2)     Status: None   Collection Time: 12/10/23  1:05 AM   Specimen: BLOOD  Result Value Ref Range Status   Specimen Description BLOOD RIGHT ANTECUBITAL  Final   Special Requests   Final    BOTTLES DRAWN AEROBIC AND ANAEROBIC Blood Culture results may not be optimal due to an inadequate volume of blood received in culture bottles   Culture   Final    NO GROWTH 5 DAYS Performed at [facility], [address], [place], [place] [address]    Report Status 12/15/2023 FINAL  Final  Blood Culture (routine x 2)     Status: None   Collection Time: 12/10/23  1:05 AM   Specimen: BLOOD  Result Value Ref Range Status   Specimen Description BLOOD BLOOD RIGHT FOREARM  Final   Special Requests   Final    BOTTLES DRAWN AEROBIC AND ANAEROBIC Blood Culture adequate volume   Culture   Final    NO GROWTH 5 DAYS Performed at [facility], [address], [place], [place] [address]    Report Status 12/15/2023 FINAL  Final  CSF culture w Gram Stain     Status: None   Collection Time: 12/10/23 12:15 PM    Specimen: CSF; Cerebrospinal Fluid  Result Value Ref Range Status   Specimen Description   Final    CSF Performed at [facility], [address], [place], [place] [address]    Special Requests   Final    CSF Performed at [facility], [address] [place], [place] [address]    Gram Stain   Final    RED BLOOD CELLS WBC SEEN NO ORGANISMS SEEN Performed at [facility], [address], [place], [place] [address]    Culture   Final    NO GROWTH 3 DAYS Performed at [facility], [address] [place], [place] [address]    Report Status 12/14/2023 FINAL  Final  MRSA Next Gen by PCR, Nasal     Status: None   Collection Time: 12/11/23  2:30 AM   Specimen: Nasal Mucosa; Nasal Swab  Result Value Ref Range Status   MRSA by PCR Next Gen NOT DETECTED NOT DETECTED Final    Comment: (NOTE) The GeneXpert MRSA Assay (FDA approved for NASAL specimens only), is one component of a comprehensive MRSA colonization surveillance program. It is not intended to diagnose MRSA infection nor to guide or monitor treatment for MRSA infections. Test performance is not FDA approved in patients less than 22 years old. Performed at [facility], [address] [place], [place] [address]   C Difficile Quick Screen (NO PCR Reflex)     Status: None   Collection Time: 12/14/23  5:52 PM   Specimen: STOOL  Result Value Ref Range Status   C Diff antigen NEGATIVE NEGATIVE Final   C Diff toxin NEGATIVE NEGATIVE Final   C Diff  interpretation No C. difficile detected.  Final    Comment: Performed at [facility], [address] [place], [place] [address]     Time coordinating discharge:  I have spent 35 minutes face to face with the patient and on the ward discussing the patients care, assessment, plan and disposition with other care givers. >50% of the time was devoted counseling the patient about the risks and benefits of treatment/Discharge  disposition and coordinating care.   SIGNED:   Miguel Rota, MD  [facility] 12/15/2023, 3:43 PM   If 7PM-7AM, please contact night-coverage

## 2023-12-15 NOTE — Progress Notes (Signed)
     [facility] SERVICES REFERRAL        Occupational Therapy * Physical Therapy * Speech Therapy                           DATE 12/15/2023  PATIENT NAME [name] Horn    PATIENT MRN: [id number]        DIAGNOSIS/DIAGNOSIS CODE:   Benign recurrent aseptic meningitis /G03.2  DATE OF DISCHARGE: 12/15/2023       PRIMARY CARE PHYSICIAN:[facility] P[facility] PHONE/FAX :[phone]      Dear Provider (Name: [facility] outpatient __  Fax: [phone]   I certify that I have examined this patient and that occupational/physical/speech therapy is necessary on an outpatient basis.    The patient has expressed interest in completing their recommended course of therapy at your  location.  Once a formal order from the patient's primary care physician has been obtained, please  contact him/her to schedule an appointment for evaluation at your earliest convenience.   [ x]  Physical Therapy Evaluate and Treat  [  ]  Occupational Therapy Evaluate and Treat  [  ]  Speech Therapy Evaluate and Treat         The patient's primary care physician (listed above) must furnish and be responsible for a formal order such that the recommended services may be furnished while under the primary physician's care, and that the plan of care will be established and reviewed every 30 days (or more often if condition necessitates).

## 2023-12-15 NOTE — TOC Progression Note (Addendum)
Transition of Care [facility]) - Progression Note    Patient Details  Name: [name] Horn MRN: [id number] Date of Birth: 1976/12/21  [facility] of [facility]) CM/SW Contact  Truddie Hidden, RN Phone Number: 12/15/2023, 11:17 AM  Clinical Narrative:    Spoke with patient regarding potential discharge for today and therapy recommendations. Patient was advised of HH recommendation. She would like outpatient therapy via [employer]. Her spouse will transport her home at discharge.     Expected Discharge Plan: Home/Self Care Barriers to Discharge: Continued Medical Work up  Expected Discharge Plan and Services       Living arrangements for the past 2 months: Single Family Home                                       Social Determinants of Health (SDOH) Interventions SDOH Screenings   Food Insecurity: No Food Insecurity (12/11/2023)  Housing: High Risk (12/11/2023)  Transportation Needs: No Transportation Needs (12/11/2023)  Utilities: Not At Risk (12/11/2023)  Depression (PHQ2-9): Low Risk  (07/06/2021)  Financial Resource Strain: Patient Declined (11/21/2023)   Received from [facility]  Recent Concern: Financial Resource Strain - Medium Risk (08/26/2023)   Received from [facility]  Social Connections: Unknown (05/06/2022)   Received from [facility]  Tobacco Use: Medium Risk (12/10/2023)    Readmission Risk Interventions    12/11/2023   11:55 AM 09/11/2023   12:13 PM 11/13/2022   10:26 AM  Readmission Risk Prevention Plan  Transportation Screening Complete Complete Complete  PCP or Specialist Appt within 3-5 Days   Complete  HRI or Home Care Consult   Complete  Social Work Consult for Recovery Care Planning/Counseling   Complete  Palliative Care Screening   Not Applicable  Medication Review Oceanographer) Complete Complete Complete  PCP or Specialist appointment within 3-5 days of discharge Complete  Complete   HRI or Home Care Consult Complete    SW Recovery Care/Counseling Consult Complete Complete   Palliative Care Screening Not Applicable Not Applicable   Skilled Nursing Facility Not Applicable Not Applicable

## 2023-12-15 NOTE — TOC Transition Note (Signed)
Transition of Care [facility]) - Discharge Note   Patient Details  Name: [name] Horn MRN: [id number] Date of Birth: August 08, 1976  Transition of Care [facility]) CM/SW Contact:  Truddie Hidden, RN Phone Number: 12/15/2023, 1:40 PM   Clinical Narrative:    Patient discharging home. Outpatient therapy arranged via [facility]. TOC signing off.       Barriers to Discharge: Continued Medical Work up   Patient Goals and CMS Choice   CMS Medicare.gov Compare Post Acute Care list provided to:: Patient Represenative (must comment) Choice offered to / list presented to : Spouse      Discharge Placement                       Discharge Plan and Services Additional resources added to the After Visit Summary for                                       Social Drivers of Health (SDOH) Interventions SDOH Screenings   Food Insecurity: No Food Insecurity (12/11/2023)  Housing: High Risk (12/11/2023)  Transportation Needs: No Transportation Needs (12/11/2023)  Utilities: Not At Risk (12/11/2023)  Depression (PHQ2-9): Low Risk  (07/06/2021)  Financial Resource Strain: Patient Declined (11/21/2023)   Received from [facility]  Recent Concern: Financial Resource Strain - Medium Risk (08/26/2023)   Received from [facility]  Social Connections: Unknown (05/06/2022)   Received from [facility]  Tobacco Use: Medium Risk (12/10/2023)     Readmission Risk Interventions    12/11/2023   11:55 AM 09/11/2023   12:13 PM 11/13/2022   10:26 AM  Readmission Risk Prevention Plan  Transportation Screening Complete Complete Complete  PCP or Specialist Appt within 3-5 Days   Complete  HRI or Home Care Consult   Complete  Social Work Consult for Recovery Care Planning/Counseling   Complete  Palliative Care Screening   Not Applicable  Medication Review Oceanographer) Complete Complete Complete  PCP or Specialist appointment within 3-5  days of discharge Complete Complete   HRI or Home Care Consult Complete    SW Recovery Care/Counseling Consult Complete Complete   Palliative Care Screening Not Applicable Not Applicable   Skilled Nursing Facility Not Applicable Not Applicable

## 2023-12-18 LAB — LEPTOSPIRA AB SCREEN

## 2023-12-18 NOTE — Unmapped (Signed)
Specialty Medication(s): Dupixent    Julia Contreras has been dis-enrolled from the [employer] and [facility] specialty pharmacy services due to multiple unsuccessful outreach attempts by the pharmacy.    Additional information provided to the patient: n/a    [name], PharmD  [facility] Specialty and [facility] Specialty Pharmacist

## 2023-12-26 ENCOUNTER — Other Ambulatory Visit: Payer: Self-pay

## 2023-12-29 ENCOUNTER — Other Ambulatory Visit: Payer: Self-pay

## 2024-01-01 ENCOUNTER — Other Ambulatory Visit: Payer: Self-pay

## 2024-01-02 ENCOUNTER — Other Ambulatory Visit (HOSPITAL_COMMUNITY): Payer: Self-pay

## 2024-01-03 ENCOUNTER — Telehealth: Payer: Self-pay

## 2024-01-03 NOTE — Telephone Encounter (Signed)
 Called and spoke with patient after receiving voicemail from her. She is canceling appointment for 01/05/24 for IVIG due to current sickness. She says she is going to be follow up with her Dr. In office in 3 weeks. May possibly be starting new treatment. She advised that she will call back after follow up with Dr. Maeola!

## 2024-01-04 ENCOUNTER — Other Ambulatory Visit: Payer: Self-pay

## 2024-01-05 ENCOUNTER — Ambulatory Visit: Payer: Medicaid Other

## 2024-02-07 ENCOUNTER — Other Ambulatory Visit: Payer: Self-pay

## 2024-02-09 ENCOUNTER — Other Ambulatory Visit: Payer: Self-pay

## 2024-02-12 ENCOUNTER — Other Ambulatory Visit: Payer: Self-pay

## 2024-05-08 ENCOUNTER — Other Ambulatory Visit: Payer: Self-pay

## 2024-05-11 ENCOUNTER — Other Ambulatory Visit (HOSPITAL_COMMUNITY): Payer: Self-pay

## 2024-05-22 ENCOUNTER — Other Ambulatory Visit: Payer: Self-pay

## 2024-05-22 NOTE — Progress Notes (Signed)
 Disenrolling - Immune Globulin  last filled 12.6.24 (173 days ago). Call center unable to reach patient and appears medication was never administered at infusion center.

## 2024-07-05 ENCOUNTER — Emergency Department

## 2024-07-05 ENCOUNTER — Emergency Department
Admission: EM | Admit: 2024-07-05 | Discharge: 2024-07-05 | Disposition: A | Discharge status: Home / Self Care | Attending: Emergency Medicine | Admitting: Emergency Medicine

## 2024-07-05 ENCOUNTER — Other Ambulatory Visit: Payer: Self-pay

## 2024-07-05 DIAGNOSIS — S301XXA Contusion of abdominal wall, initial encounter: Secondary | ICD-10-CM | POA: Diagnosis not present

## 2024-07-05 DIAGNOSIS — W1809XA Striking against other object with subsequent fall, initial encounter: Secondary | ICD-10-CM | POA: Diagnosis not present

## 2024-07-05 DIAGNOSIS — W19XXXA Unspecified fall, initial encounter: Secondary | ICD-10-CM

## 2024-07-05 DIAGNOSIS — S3991XA Unspecified injury of abdomen, initial encounter: Secondary | ICD-10-CM | POA: Diagnosis present

## 2024-07-05 DIAGNOSIS — I110 Hypertensive heart disease with heart failure: Secondary | ICD-10-CM | POA: Insufficient documentation

## 2024-07-05 DIAGNOSIS — I509 Heart failure, unspecified: Secondary | ICD-10-CM | POA: Diagnosis not present

## 2024-07-05 DIAGNOSIS — S0990XA Unspecified injury of head, initial encounter: Secondary | ICD-10-CM | POA: Insufficient documentation

## 2024-07-05 DIAGNOSIS — M79672 Pain in left foot: Secondary | ICD-10-CM | POA: Diagnosis not present

## 2024-07-05 LAB — CBC
HCT: 38.2 % (ref 36.0–46.0)
Hemoglobin: 13 g/dL (ref 12.0–15.0)
MCH: 31.9 pg (ref 26.0–34.0)
MCHC: 34 g/dL (ref 30.0–36.0)
MCV: 93.9 fL (ref 80.0–100.0)
Platelets: 259 K/uL (ref 150–400)
RBC: 4.07 MIL/uL (ref 3.87–5.11)
RDW: 16.8 % — ABNORMAL HIGH (ref 11.5–15.5)
WBC: 8.1 K/uL (ref 4.0–10.5)
nRBC: 0 % (ref 0.0–0.2)

## 2024-07-05 LAB — COMPREHENSIVE METABOLIC PANEL WITH GFR
ALT: 41 U/L (ref 0–44)
AST: 42 U/L — ABNORMAL HIGH (ref 15–41)
Albumin: 4.5 g/dL (ref 3.5–5.0)
Alkaline Phosphatase: 96 U/L (ref 38–126)
Anion gap: 15 (ref 5–15)
BUN: 14 mg/dL (ref 6–20)
CO2: 24 mmol/L (ref 22–32)
Calcium: 9.3 mg/dL (ref 8.9–10.3)
Chloride: 98 mmol/L (ref 98–111)
Creatinine, Ser: 0.9 mg/dL (ref 0.44–1.00)
GFR, Estimated: >60 mL/min (ref >60)
Glucose, Bld: 133 mg/dL — ABNORMAL HIGH (ref 70–99)
Potassium: 2.8 mmol/L — ABNORMAL LOW (ref 3.5–5.1)
Sodium: 137 mmol/L (ref 135–145)
Total Bilirubin: 0.9 mg/dL (ref 0.0–1.2)
Total Protein: 7.6 g/dL (ref 6.5–8.1)

## 2024-07-05 MED ORDER — IOHEXOL 300 MG/ML  SOLN
100.0000 mL | Freq: Once | INTRAMUSCULAR | Status: AC | PRN
  Administered 2024-07-05: 100 mL via INTRAVENOUS

## 2024-07-05 MED ORDER — POTASSIUM CHLORIDE CRYS ER 20 MEQ PO TBCR
40.0000 meq | EXTENDED_RELEASE_TABLET | Freq: Once | ORAL | Status: AC
  Administered 2024-07-05: 40 meq via ORAL
  Filled 2024-07-05: qty 2

## 2024-07-05 NOTE — ED Provider Notes (Signed)
 [facility] Provider Note    Event Date/Time   First MD Initiated Contact with Patient 07/05/24 1536     (approximate)   History   Fall   HPI [name] Horn is a 47 y.o. female with history of GERD, HTN, HLD, CHF presenting today for fall.  Patient states she was getting off the toilet last night when she either tripped or got lightheaded and fell forward.  Hit her face into the door with loss of consciousness.  Not on any blood thinners.  Noted headache today along with swelling to the left side of her head.  Denies any vision changes or vomiting.  Also complaining of pain in her left foot.  Denies any other pain to her extremities.  Separately, she also noted bruising in her left lower abdomen along with pain.  The symptoms were present before the fall and she was on blood thinners while in the hospital in the past week but denies any injections in that part of her abdomen.  Chart review: Reviewed notes from recent hospitalization at [facility].     Physical Exam   Triage Vital Signs: ED Triage Vitals  Encounter Vitals Group     BP 07/05/24 1310 132/83     Girls Systolic BP Percentile --      Girls Diastolic BP Percentile --      Boys Systolic BP Percentile --      Boys Diastolic BP Percentile --      Pulse Rate 07/05/24 1310 89     Resp 07/05/24 1310 18     Temp 07/05/24 1310 98 F (36.7 C)     Temp src --      SpO2 07/05/24 1310 100 %     Weight 07/05/24 1310 183 lb (83 kg)     Height 07/05/24 1310 5' 4 (1.626 m)     Head Circumference --      Peak Flow --      Pain Score 07/05/24 1308 10     Pain Loc --      Pain Education --      Exclude from Growth Chart --     Most recent vital signs: Vitals:   07/05/24 1310 07/05/24 1718  BP: 132/83 130/80  Pulse: 89 80  Resp: 18 18  Temp: 98 F (36.7 C) 98 F (36.7 C)  SpO2: 100% 100%   I have reviewed the vital signs. General:  Awake, alert, no acute distress. Head:  Normocephalic, mild  tenderness palpation to left parietal scalp without hematoma.  No bruising to front of face. EENT:  PERRL, EOMI, Oral mucosa pink and moist, Neck is supple.  No C-spine tenderness palpation. Cardiovascular: Regular rate, 2+ distal pulses. Respiratory:  Normal respiratory effort, symmetrical expansion, no distress.  Abdomen: soft, mild TTP in LLQ with ecchymosis present, non-distended.  Extremities: Mild tenderness palpation over left dorsal midfoot.  Nontender elsewise throughout bilateral upper and lower extremities. Neuro:  Alert and oriented.  Interacting appropriately.   Skin:  Warm, dry, no rash.   Psych: Appropriate affect.    ED Results / Procedures / Treatments   Labs (all labs ordered are listed, but only abnormal results are displayed) Labs Reviewed  COMPREHENSIVE METABOLIC PANEL WITH GFR - Abnormal; Notable for the following components:      Result Value   Potassium 2.8 (*)    Glucose, Bld 133 (*)    AST 42 (*)    All other components within normal limits  CBC - Abnormal; Notable for the following components:   RDW 16.8 (*)    All other components within normal limits  URINALYSIS, ROUTINE W REFLEX MICROSCOPIC  CBG MONITORING, ED     EKG My EKG interpretation: Rate of 90, normal sinus rhythm, normal axis, normal intervals.  No acute ST elevations or depressions   RADIOLOGY Independently interpreted CT head and cervical spine with no acute pathology.  Independently interpreted x-ray of left foot with no traumatic pathology.  CT abdomen/pelvis negative   PROCEDURES:  Critical Care performed: No  Procedures   MEDICATIONS ORDERED IN ED: Medications  potassium chloride  SA (KLOR-CON  M) CR tablet 40 mEq (40 mEq Oral Given 07/05/24 1555)  iohexol  (OMNIPAQUE ) 300 MG/ML solution 100 mL (100 mLs Intravenous Contrast Given 07/05/24 1621)     IMPRESSION / MDM / ASSESSMENT AND PLAN / ED COURSE  I reviewed the triage vital signs and the nursing notes.                               Differential diagnosis includes, but is not limited to, ICH, cervical spine injury, soft tissue hematoma, acute intra-abdominal trauma versus bleeding versus diverticulitis, metatarsal fracture, soft tissue imaging of the foot  Patient's presentation is most consistent with acute complicated illness / injury requiring diagnostic workup.  Patient is a 48 year old female presenting today for fall yesterday evening with head injury as well as left foot injury.  Small hematoma to left parietal scalp and tenderness palpation over the left midfoot.  CT imaging of head and neck as well as x-ray of the left foot was ordered.  This shows no acute traumatic pathology and suspect more likely soft tissue bruising at this time.  CBC unremarkable.  CMP with mild hypokalemia at 2.8.  Her exam also does show notable bruising and tenderness palpation of the left lower quadrant but she reports this was present prior to the fall.  Was reportedly receiving anticoagulation injections in her abdomen but they were on the opposite side from this.  Will get CT imaging of the abdomen/pelvis for further evaluation.  Given potassium repletion as well.  CT abdomen/pelvis shows no acute intra-abdominal processes and likely the bruising is from medications such as blood thinners that she was getting while in the hospital.  Patient otherwise with reassuring exam and vital signs at this time and will discharge with PCP follow-up as needed.  Given strict return precautions.     FINAL CLINICAL IMPRESSION(S) / ED DIAGNOSES   Final diagnoses:  Fall, initial encounter  Injury of head, initial encounter  Left foot pain     Rx / DC Orders   ED Discharge Orders     None        Note:  This document was prepared using Dragon voice recognition software and may include unintentional dictation errors.   Malvina Alm DASEN, MD 07/05/24 1736

## 2024-07-05 NOTE — Discharge Instructions (Signed)
 CT imaging of your head and neck showed no traumatic injuries today.  X-ray of your left foot also does not show any injury.  We did CT imaging of your abdomen which shows no acute intra-abdominal processes.  The bruising is likely from the medications that were giving you while hospitalized.  Suspect likely bruising in these areas in your foot and around your scalp from the fall but nothing internally.  Your laboratory workup is largely reassuring elsewise.  Please follow-up with your primary care provider as needed.

## 2024-07-05 NOTE — ED Provider Triage Note (Signed)
 Emergency Medicine Provider Triage Evaluation Note  [name] Horn , a 48 y.o. female  was evaluated in triage.  Pt complains of headache, neck pain and dizziness after unwitnessed fall last night. Also, she was recently discharged from hospital and is concerned for infection at IV site on left arm.  Physical Exam  BP 132/83   Pulse 89   Temp 98 F (36.7 C)   Resp 18   Ht 5' 4 (1.626 m)   Wt 83 kg   SpO2 100%   BMI 31.41 kg/m  Gen:   Awake, no distress   Resp:  Normal effort  MSK:   Moves extremities without difficulty  Other:    Medical Decision Making  Medically screening exam initiated at 1:21 PM.  Appropriate orders placed.  Ouita JANICIA MONTERROSA was informed that the remainder of the evaluation will be completed by another provider, this initial triage assessment does not replace that evaluation, and the importance of remaining in the ED until their evaluation is complete.    Herlinda Kirk NOVAK, FNP 07/05/24 1455

## 2024-07-05 NOTE — ED Triage Notes (Addendum)
 Pt comes with fall that happened yesterday. Pt unsure if she passed out. Pt did hit her head. Pt states dizziness.   Pt states she hit the front and top of scalp. Pt states she hit her cheek also and some swelling to it the patient state left foot pain.   Pt also states recently in hospital for infection. Pt states she might have infection to left arm now also .

## 2024-07-23 ENCOUNTER — Ambulatory Visit: Payer: BC Managed Care – PPO | Admitting: Dermatology

## 2024-07-23 ENCOUNTER — Encounter: Payer: Self-pay | Admitting: Dermatology

## 2024-07-23 DIAGNOSIS — D492 Neoplasm of unspecified behavior of bone, soft tissue, and skin: Secondary | ICD-10-CM | POA: Diagnosis not present

## 2024-07-23 DIAGNOSIS — D2222 Melanocytic nevi of left ear and external auricular canal: Secondary | ICD-10-CM

## 2024-07-23 DIAGNOSIS — D229 Melanocytic nevi, unspecified: Secondary | ICD-10-CM

## 2024-07-23 DIAGNOSIS — B009 Herpesviral infection, unspecified: Secondary | ICD-10-CM

## 2024-07-23 HISTORY — DX: Melanocytic nevi, unspecified: D22.9

## 2024-07-23 NOTE — Progress Notes (Signed)
   New Patient Visit   Subjective  [name] Horn is a 48 y.o. female who presents for the following:   Check mole behind left ear. Changing. Darker. Concerned as has a family hx of skin cancer. No personal Hx of dysplastic nevi or skin cancer.   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 Has been on scalp, arms, face, legs, toes, in mouth. Hx of Bx on neck.   Currently on liquid oral Valacyclovir  and gabapentin .    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  HSV infections - Disseminated herpes simplex virus - Taking Valacyclovir  500 mg three times a day in liquid form due to absorption issues with pills - Feels liquid formulation is more effective      The following portions of the chart were reviewed this encounter and updated as appropriate: medications, allergies, medical history  Review of Systems:  No other skin or systemic complaints except as noted in HPI or Assessment and Plan.  Objective  Well appearing patient in no apparent distress; mood and affect are within normal limits.  A focused examination was performed of the following areas: Face, scalp, ears, arms  Relevant exam findings are noted in the Assessment and Plan.  Left Posterior Mid Helix 0.4 mm brown macule  Assessment & Plan   HSV infection, disseminated, resolved  Exam: Healing crusted erosions at face. Hyperpigmented macules at arms. Pink excoriated papules on  arms.   Chronic and persistent condition with duration or expected duration over one year. Condition is improving with treatment but not currently at goal.   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  It is infectious and can be spread from person to person by direct contact.  It is not curable, but is treatable with topical and oral medication.  Treatment Plan Continue oral Valacyclovir  suspension tid as directed by [facility].  Go to ED for severe flares     NEOPLASM OF SKIN Left Posterior Mid Helix Epidermal / dermal shaving  Lesion diameter (cm):  0.6 Informed consent: discussed and consent obtained   Patient was prepped and draped in usual sterile fashion: Area prepped with alcohol . Anesthesia: the lesion was anesthetized in a standard fashion   Anesthetic:  1% lidocaine  w/ epinephrine  1-100,000 buffered w/ 8.4% NaHCO3 Instrument used: flexible razor blade   Hemostasis achieved with: pressure, aluminum chloride and electrodesiccation   Outcome: patient tolerated procedure well   Post-procedure details: wound care instructions given   Post-procedure details comment:  Ointment and small bandage applied  Specimen 1 - Surgical pathology Differential Diagnosis: Nevus, R/O dysplastic nevus  Check Margins: Yes  Return if symptoms worsen or fail to improve.  I, Jill Parcell, CMA, am acting as scribe for Rexene Rattler, MD.   Documentation: I have reviewed the above documentation for accuracy and completeness, and I agree with the above.  Rexene Rattler, MD

## 2024-07-23 NOTE — Patient Instructions (Signed)
 Wound Care Instructions  Cleanse wound gently with soap and water once a day then pat dry with clean gauze. Apply a thin coat of Petrolatum (petroleum jelly, "Vaseline") over the wound (unless you have an allergy to this). We recommend that you use a new, sterile tube of Vaseline. Do not pick or remove scabs. Do not remove the yellow or white "healing tissue" from the base of the wound.  Cover the wound with fresh, clean, nonstick gauze and secure with paper tape. You may use Band-Aids in place of gauze and tape if the wound is small enough, but would recommend trimming much of the tape off as there is often too much. Sometimes Band-Aids can irritate the skin.  You should call the office for your biopsy report after 1 week if you have not already been contacted.  If you experience any problems, such as abnormal amounts of bleeding, swelling, significant bruising, significant pain, or evidence of infection, please call the office immediately.  FOR ADULT SURGERY PATIENTS: If you need something for pain relief you may take 1 extra strength Tylenol (acetaminophen) AND 2 Ibuprofen (200mg  each) together every 4 hours as needed for pain. (do not take these if you are allergic to them or if you have a reason you should not take them.) Typically, you may only need pain medication for 1 to 3 days.    Recommend daily broad spectrum sunscreen SPF 30+ to sun-exposed areas, reapply every 2 hours as needed. Call for new or changing lesions.  Staying in the shade or wearing long sleeves, sun glasses (UVA+UVB protection) and wide brim hats (4-inch brim around the entire circumference of the hat) are also recommended for sun protection.    Due to recent changes in healthcare laws, you may see results of your pathology and/or laboratory studies on MyChart before the doctors have had a chance to review them. We understand that in some cases there may be results that are confusing or concerning to you. Please understand  that not all results are received at the same time and often the doctors may need to interpret multiple results in order to provide you with the best plan of care or course of treatment. Therefore, we ask that you please give [place] 2 business days to thoroughly review all your results before contacting the office for clarification. Should we see a critical lab result, you will be contacted sooner.   If You Need Anything After Your Visit  If you have any questions or concerns for your doctor, please call our main line at [phone] and press option 4 to reach your doctor's medical assistant. If no one answers, please leave a voicemail as directed and we will return your call as soon as possible. Messages left after 4 pm will be answered the following business day.   You may also send [place] a message via MyChart. We typically respond to MyChart messages within 1-2 business days.  For prescription refills, please ask your pharmacy to contact our office. Our fax number is [phone].  If you have an urgent issue when the clinic is closed that cannot wait until the next business day, you can page your doctor at the number below.    Please note that while we do our best to be available for urgent issues outside of office hours, we are not available 24/7.   If you have an urgent issue and are unable to reach [place], you may choose to seek medical care at your doctor's office,  retail clinic, urgent care center, or emergency room.  If you have a medical emergency, please immediately call 911 or go to the emergency department.  Pager Numbers  - Dr. Gwen Pounds: [phone]  - Dr. Roseanne Reno: [phone]  - Dr. Katrinka Blazing: [phone]   In the event of inclement weather, please call our main line at [phone] for an update on the status of any delays or closures.  Dermatology Medication Tips: Please keep the boxes that topical medications come in in order to help keep track of the instructions about where  and how to use these. Pharmacies typically print the medication instructions only on the boxes and not directly on the medication tubes.   If your medication is too expensive, please contact our office at [phone] option 4 or send [place] a message through MyChart.   We are unable to tell what your co-pay for medications will be in advance as this is different depending on your insurance coverage. However, we may be able to find a substitute medication at lower cost or fill out paperwork to get insurance to cover a needed medication.   If a prior authorization is required to get your medication covered by your insurance company, please allow [place] 1-2 business days to complete this process.  Drug prices often vary depending on where the prescription is filled and some pharmacies may offer cheaper prices.  The website www.goodrx.com contains coupons for medications through different pharmacies. The prices here do not account for what the cost may be with help from insurance (it may be cheaper with your insurance), but the website can give you the price if you did not use any insurance.  - You can print the associated coupon and take it with your prescription to the pharmacy.  - You may also stop by our office during regular business hours and pick up a GoodRx coupon card.  - If you need your prescription sent electronically to a different pharmacy, notify our office through [facility] or by phone at [phone] option 4.     Si Usted Necesita Algo Despus de Su Visita  Tambin puede enviarnos un mensaje a travs de Clinical cytogeneticist. Por lo general respondemos a los mensajes de MyChart en el transcurso de 1 a 2 das hbiles.  Para renovar recetas, por favor pida a su farmacia que se ponga en contacto con nuestra oficina. Annie Sable de fax es [place] [phone].  Si tiene un asunto urgente cuando la clnica est cerrada y que no puede esperar hasta el siguiente da hbil, puede llamar/localizar a  su doctor(a) al nmero que aparece a continuacin.   Por favor, tenga en cuenta que aunque hacemos todo lo posible para estar disponibles para asuntos urgentes fuera del horario de [place], no estamos disponibles las 24 horas del da, los 7 [address]  [address] de la [place].   Si tiene un problema urgente y no puede comunicarse con nosotros, puede optar por buscar atencin mdica  en el consultorio de su doctor(a), en una clnica privada, en un centro de atencin urgente o en una sala de emergencias.  Si tiene Engineer, drilling, por favor llame inmediatamente al 911 o vaya a la sala de emergencias.  Nmeros de bper  - Dr. Gwen Pounds: [phone]  - Dra. Roseanne Reno: [phone]  - Dr. Katrinka Blazing: [phone]   En caso de inclemencias del tiempo, por favor llame a Lacy Duverney principal al [phone] para una actualizacin sobre el [place] de cualquier retraso o cierre.  Consejos para la medicacin en dermatologa: Por  favor, guarde las cajas en las que vienen los medicamentos de uso tpico para ayudarle a seguir las instrucciones sobre dnde y cmo usarlos. Las farmacias generalmente imprimen las instrucciones del medicamento slo en las cajas y no directamente en los tubos del [place].   Si su medicamento es muy caro, por favor, pngase en contacto con Rolm Gala llamando al [phone] y presione la opcin 4 o envenos un mensaje a travs de Clinical cytogeneticist.   No podemos decirle cul ser su copago por los medicamentos por adelantado ya que esto es diferente dependiendo de la cobertura de su seguro. Sin embargo, es posible que podamos encontrar un medicamento sustituto a Audiological scientist un formulario para que el seguro cubra el medicamento que se considera necesario.   Si se requiere una autorizacin previa para que su compaa de seguros [place] su medicamento, por favor permtanos de 1 a 2 das hbiles para completar [address]  Los precios de los medicamentos varan con frecuencia dependiendo  del Environmental consultant de dnde se surte la receta y alguna farmacias pueden ofrecer precios ms baratos.  El sitio web www.goodrx.com tiene cupones para medicamentos de Health and safety inspector. Los precios aqu no tienen en cuenta lo que podra costar con la ayuda del seguro (puede ser ms barato con su seguro), pero el sitio web puede darle el precio si no utiliz Tourist information centre manager.  - Puede imprimir el cupn correspondiente y llevarlo con su receta a la farmacia.  - Tambin puede pasar por nuestra oficina durante el horario de atencin regular y Education officer, museum una tarjeta de cupones de GoodRx.  - Si necesita que su receta se enve electrnicamente a una farmacia diferente, informe a nuestra oficina a travs de MyChart de [place] o por telfono llamando al [phone] y presione la opcin 4.

## 2024-07-26 ENCOUNTER — Other Ambulatory Visit: Payer: Self-pay

## 2024-07-26 ENCOUNTER — Emergency Department
Admission: EM | Admit: 2024-07-26 | Discharge: 2024-07-26 | Disposition: A | Discharge status: Home / Self Care | Attending: Emergency Medicine | Admitting: Emergency Medicine

## 2024-07-26 DIAGNOSIS — L299 Pruritus, unspecified: Secondary | ICD-10-CM | POA: Diagnosis not present

## 2024-07-26 DIAGNOSIS — L2989 Other pruritus: Secondary | ICD-10-CM

## 2024-07-26 DIAGNOSIS — R509 Fever, unspecified: Secondary | ICD-10-CM | POA: Diagnosis present

## 2024-07-26 MED ORDER — MUPIROCIN 2 % EX OINT: 1.0000 | TOPICAL_OINTMENT | Freq: Two times a day (BID) | CUTANEOUS | 0 refills | Status: AC

## 2024-07-26 NOTE — ED Provider Notes (Signed)
 [facility] Provider Note    Event Date/Time   First MD Initiated Contact with Patient 07/26/24 1728     (approximate)   History   Fever   HPI  [name] Horn is a 48 y.o. 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  States there is one in her scalp, several on her face and one on her back.  Felt like the one on her back had pus in it.  No fever or chills.  States her temp at home was 99.  Did call her doctor and they told her to come the ER to see if she needed to be evaluated and admitted for IV valacyclovir       Physical Exam   Triage Vital Signs: ED Triage Vitals [07/26/24 1708]  Encounter Vitals Group     BP (!) 121/91     Girls Systolic BP Percentile      Girls Diastolic BP Percentile      Boys Systolic BP Percentile      Boys Diastolic BP Percentile      Pulse Rate 84     Resp 17     Temp 98.4 F (36.9 C)     Temp Source Oral     SpO2 98 %     Weight 182 lb 15.7 oz (83 kg)     Height 5' 4 (1.626 m)     Head Circumference      Peak Flow      Pain Score 0     Pain Loc      Pain Education      Exclude from Growth Chart     Most recent vital signs: Vitals:   07/26/24 1708 07/26/24 1841  BP: (!) 121/91   Pulse: 84   Resp: 17   Temp: 98.4 F (36.9 C)   SpO2: 98% 98%     General: Awake, no distress.   CV:  Good peripheral perfusion.  Resp:  Normal effort. Abd:  No distention.   Other:  Skin with 5 areas on her face that appear to be more like scratches no herpetic lesions noted, area on her back appears to be acne like   ED Results / Procedures / Treatments   Labs (all labs ordered are listed, but only abnormal results are displayed) Labs Reviewed - No data to display   EKG     RADIOLOGY     PROCEDURES:   Procedures  Critical Care:  no Chief Complaint  Patient presents  with   Fever      MEDICATIONS ORDERED IN ED: Medications - No data to display   IMPRESSION / MDM / ASSESSMENT AND PLAN / ED COURSE  I reviewed the triage vital signs and the nursing notes.                              Differential diagnosis includes, but is not limited to, chronic rash, herpes disseminated, staph infection, wound infection  Patient's presentation is most consistent with acute illness / injury with system symptoms.   I did extensively review the patient's past medical charts.  I do see where she was admitted previously for IV valacyclovir .  I do see where they changed her to liquid valacyclovir .  I got 2 different stories from the patient saying she was taking it  once a day.  I explained to her seem to be taking it more often.  Then she states she was supposed to be taking it 3 times daily.  Still unsure if the patient has been taking her medication as prescribed.  Discussed with Dr. Willo.  He agrees there is no need for further workup as she appears to be stable, is afebrile, and rash is limited to the face.  She was given a prescription for mupirocin ointment in case secondary infection is starting.  She is to call her doctor and follow-up with her specialist.  Explained to her we do not have a dermatologist on-call in this area.  She is in agreement treatment plan.  Discharged stable condition.       FINAL CLINICAL IMPRESSION(S) / ED DIAGNOSES   Final diagnoses:  Chronic pruritic rash in adult     Rx / DC Orders   ED Discharge Orders          Ordered    mupirocin ointment (BACTROBAN) 2 %  2 times daily        07/26/24 1823             Note:  This document was prepared using Dragon voice recognition software and may include unintentional dictation errors.    Gasper Devere ORN, PA-C 07/26/24 ZELPHA Willo Dunnings, MD 07/26/24 [phone]

## 2024-07-26 NOTE — Discharge Instructions (Signed)
 Follow-up with your doctor.  Today does not appear that the rash is disseminated enough that you need to be admitted.  Also I think you should continue your valacyclovir  3 times daily as instructed.  In case there is secondary infection use the mupirocin ointment.

## 2024-07-26 NOTE — ED Triage Notes (Signed)
 Pt sts that she has herpes simplex virus and that she has been having an outbreak needs medication know for her outbreak.

## 2024-07-30 LAB — SURGICAL PATHOLOGY

## 2024-07-31 ENCOUNTER — Ambulatory Visit: Payer: Self-pay | Admitting: Dermatology

## 2024-07-31 ENCOUNTER — Encounter: Payer: Self-pay | Admitting: Dermatology

## 2024-07-31 NOTE — Telephone Encounter (Signed)
-----   Message from Rexene Rattler sent at 07/31/2024 12:31 PM EDT ----- 1. Skin, left posterior mid helix :       DYSPLASTIC NEVUS WITH MODERATE TO SEVERE ATYPIA, DEEP MARGIN INVOLVED, SEE       DESCRIPTION   Moderately to severely atypical mole, needs repeat shave removal once healed - please call patient ----- Message ----- From: Interface, Lab In [facility] Sent: 07/30/2024   5:11 PM EDT To: Rexene Rattler, MD

## 2024-07-31 NOTE — Telephone Encounter (Signed)
 Patient advised of BX result and scheduled for shave removal. aw

## 2024-07-31 NOTE — Telephone Encounter (Signed)
 Left message for patient to call back for biopsy results.

## 2024-08-01 ENCOUNTER — Emergency Department (HOSPITAL_COMMUNITY)

## 2024-08-01 ENCOUNTER — Encounter (HOSPITAL_COMMUNITY): Payer: Self-pay

## 2024-08-01 ENCOUNTER — Emergency Department (HOSPITAL_COMMUNITY)
Admission: EM | Admit: 2024-08-01 | Discharge: 2024-08-01 | Disposition: A | Discharge status: Home / Self Care | Attending: Emergency Medicine | Admitting: Emergency Medicine

## 2024-08-01 ENCOUNTER — Other Ambulatory Visit: Payer: Self-pay

## 2024-08-01 DIAGNOSIS — R000 Tachycardia, unspecified: Secondary | ICD-10-CM | POA: Diagnosis not present

## 2024-08-01 DIAGNOSIS — R519 Headache, unspecified: Secondary | ICD-10-CM | POA: Insufficient documentation

## 2024-08-01 DIAGNOSIS — R509 Fever, unspecified: Secondary | ICD-10-CM | POA: Diagnosis present

## 2024-08-01 DIAGNOSIS — K529 Noninfective gastroenteritis and colitis, unspecified: Secondary | ICD-10-CM | POA: Insufficient documentation

## 2024-08-01 LAB — CBC WITH DIFFERENTIAL/PLATELET
Abs Immature Granulocytes: 0.03 K/uL (ref 0.00–0.07)
Basophils Absolute: 0 K/uL (ref 0.0–0.1)
Basophils Relative: 0 %
Eosinophils Absolute: 0 K/uL (ref 0.0–0.5)
Eosinophils Relative: 0 %
HCT: 39.1 % (ref 36.0–46.0)
Hemoglobin: 13.6 g/dL (ref 12.0–15.0)
Immature Granulocytes: 0 %
Lymphocytes Relative: 10 %
Lymphs Abs: 0.8 K/uL (ref 0.7–4.0)
MCH: 32.3 pg (ref 26.0–34.0)
MCHC: 34.8 g/dL (ref 30.0–36.0)
MCV: 92.9 fL (ref 80.0–100.0)
Monocytes Absolute: 0.2 K/uL (ref 0.1–1.0)
Monocytes Relative: 2 %
Neutro Abs: 6.9 K/uL (ref 1.7–7.7)
Neutrophils Relative %: 88 %
Platelets: 144 K/uL — ABNORMAL LOW (ref 150–400)
RBC: 4.21 MIL/uL (ref 3.87–5.11)
RDW: 16.7 % — ABNORMAL HIGH (ref 11.5–15.5)
WBC: 8 K/uL (ref 4.0–10.5)
nRBC: 0 % (ref 0.0–0.2)

## 2024-08-01 LAB — COMPREHENSIVE METABOLIC PANEL WITH GFR
ALT: 89 U/L — ABNORMAL HIGH (ref 0–44)
AST: 68 U/L — ABNORMAL HIGH (ref 15–41)
Albumin: 3.7 g/dL (ref 3.5–5.0)
Alkaline Phosphatase: 105 U/L (ref 38–126)
Anion gap: 10 (ref 5–15)
BUN: 5 mg/dL — ABNORMAL LOW (ref 6–20)
CO2: 21 mmol/L — ABNORMAL LOW (ref 22–32)
Calcium: 9.7 mg/dL (ref 8.9–10.3)
Chloride: 108 mmol/L (ref 98–111)
Creatinine, Ser: 0.86 mg/dL (ref 0.44–1.00)
GFR, Estimated: >60 mL/min (ref >60)
Glucose, Bld: 125 mg/dL — ABNORMAL HIGH (ref 70–99)
Potassium: 3.7 mmol/L (ref 3.5–5.1)
Sodium: 139 mmol/L (ref 135–145)
Total Bilirubin: 0.7 mg/dL (ref 0.0–1.2)
Total Protein: 6.9 g/dL (ref 6.5–8.1)

## 2024-08-01 LAB — CBG MONITORING, ED: Glucose-Capillary: 134 mg/dL — ABNORMAL HIGH (ref 70–99)

## 2024-08-01 LAB — MAGNESIUM: Magnesium: 1.7 mg/dL (ref 1.7–2.4)

## 2024-08-01 MED ORDER — KETOROLAC TROMETHAMINE 15 MG/ML IJ SOLN
15.0000 mg | Freq: Once | INTRAMUSCULAR | Status: AC
  Administered 2024-08-01: 15 mg via INTRAVENOUS
  Filled 2024-08-01: qty 1

## 2024-08-01 MED ORDER — LACTATED RINGERS IV BOLUS
1000.0000 mL | Freq: Once | INTRAVENOUS | Status: AC
  Administered 2024-08-01: 1000 mL via INTRAVENOUS

## 2024-08-01 MED ORDER — ONDANSETRON HCL 4 MG/2ML IJ SOLN
4.0000 mg | Freq: Once | INTRAMUSCULAR | Status: AC
  Administered 2024-08-01: 4 mg via INTRAVENOUS
  Filled 2024-08-01: qty 2

## 2024-08-01 MED ORDER — OXYCODONE HCL 5 MG PO TABS
10.0000 mg | ORAL_TABLET | Freq: Once | ORAL | Status: AC
  Administered 2024-08-01: 10 mg via ORAL
  Filled 2024-08-01: qty 2

## 2024-08-01 MED ORDER — ACETAMINOPHEN 325 MG PO TABS
650.0000 mg | ORAL_TABLET | Freq: Once | ORAL | Status: AC
  Administered 2024-08-01: 650 mg via ORAL
  Filled 2024-08-01: qty 2

## 2024-08-01 MED ORDER — MAGNESIUM SULFATE 2 GM/50ML IV SOLN
2.0000 g | Freq: Once | INTRAVENOUS | Status: AC
  Administered 2024-08-01: 2 g via INTRAVENOUS
  Filled 2024-08-01: qty 50

## 2024-08-01 MED ORDER — MAGIC MOUTHWASH
15.0000 mL | Freq: Once | ORAL | Status: AC
  Administered 2024-08-01: 15 mL via ORAL
  Filled 2024-08-01: qty 15

## 2024-08-01 MED ORDER — DEXAMETHASONE SODIUM PHOSPHATE 10 MG/ML IJ SOLN
10.0000 mg | Freq: Once | INTRAMUSCULAR | Status: AC
  Administered 2024-08-01: 10 mg via INTRAVENOUS
  Filled 2024-08-01: qty 1

## 2024-08-01 MED ORDER — PROCHLORPERAZINE EDISYLATE 10 MG/2ML IJ SOLN
10.0000 mg | Freq: Once | INTRAMUSCULAR | Status: AC
  Administered 2024-08-01: 10 mg via INTRAVENOUS
  Filled 2024-08-01: qty 2

## 2024-08-01 NOTE — ED Provider Notes (Signed)
 [facility] AT [facility] Provider Note   CSN: [id number] Arrival date & time: 08/01/24  1526     Patient presents with: Headache and Nausea   [name] Horn is a 48 y.o. 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  Patient states that for approximately 1 week she has experienced intermittent episodes of fever with Tmax 101, nonbloody and nonbilious emesis, nausea, nonbloody diarrhea and acute headache that worsened over the past 24 hours.  Patient states that for the past 1 to 2 days she has had decreased p.o. intake and inability to take her home medications including beta-blocker and hydrocortisone secondary to episodes of nausea and emesis.  She currently endorses generalized abdominal pain, nausea and states that her disseminated HSV is flaring with rash on her face and intraoral lesions.    Headache      Prior to Admission medications   Medication Sig Start Date End Date Taking? Authorizing Provider  albuterol  (PROVENTIL ) (2.5 MG/3ML) 0.083% nebulizer solution Take 3 mLs (2.5 mg total) by nebulization every 4 (four) hours as needed for wheezing or shortness of breath. 11/20/22   Pahwani, Velna SAUNDERS, MD  albuterol  (VENTOLIN  HFA) 108 (90 Base) MCG/ACT inhaler Inhale 1 puff into the lungs every 4 (four) hours as needed. 04/12/23   [provider]  amphetamine -dextroamphetamine  (ADDERALL) 20 MG tablet Take 20 mg by mouth 3 (three) times daily.    [provider]  budesonide -formoterol  (SYMBICORT ) 80-4.5 MCG/ACT inhaler Inhale 2 puffs into the lungs 2 (two) times daily. Can also take Q4 hr as needed for wheezing  11/17/22   Jillian Buttery, MD  cycloSPORINE  (RESTASIS ) 0.05 % ophthalmic emulsion Place 1 drop into both eyes 2 (two) times daily.    [provider]  dicyclomine  (BENTYL ) 10 MG capsule Take 10 mg by mouth daily as needed for nausea/vomiting. 01/02/19   [provider]  famotidine  (PEPCID ) 40 MG tablet Take 40 mg by mouth at bedtime. 05/02/21   [provider]  folic acid  (FOLVITE ) 1 MG tablet Take 2 tablets (2 mg total) by mouth daily. Patient taking differently: Take 1 mg by mouth daily. 10/30/19   Dolphus Reiter, MD  furosemide  (LASIX ) 20 MG tablet Take 60 mg by mouth 2 (two) times daily as needed for fluid or edema.    [provider]  gabapentin  (NEURONTIN ) 250 MG/5ML solution Take by mouth 3 (three) times daily.    [provider]  hydrOXYzine  (ATARAX ) 25 MG tablet Take 1 tablet (25 mg total) by mouth 2 (two) times daily. Patient taking differently: Take 25 mg by mouth 2 (two) times daily as needed for itching or anxiety. 09/21/23   Dorinda Drue DASEN, MD  lactulose  (CHRONULAC ) 10 GM/15ML solution Take 30 mLs (20 g total) by mouth daily as needed for mild constipation. 09/21/23   Dorinda Drue DASEN, MD  midodrine  (PROAMATINE ) 10 MG tablet Take 1 tablet (10 mg total) by mouth 3 (three) times daily with meals. 12/15/23   Amin, Ankit C, MD  mupirocin  ointment (BACTROBAN ) 2 % Apply 1 Application topically 2 (two) times daily. 07/26/24   Fisher, Devere ORN, PA-C  naloxone  (NARCAN ) nasal spray 4 mg/0.1 mL Place 1 spray into the nose once. 02/13/23   [provider]  nystatin  (MYCOSTATIN ) 100000 UNIT/ML suspension Take 10 mLs (1,000,000 Units total) by mouth 3 (three) times daily. Patient taking differently: Take 10 mLs by mouth 3 (three) times daily as needed (Mouth sores). 12/07/20   Fleeta Kathie Jomarie LOISE, MD  nystatin  powder Apply 1 application topically 2 (two) times daily as needed (rash).    [provider]  ondansetron  (ZOFRAN -ODT) 8 MG  disintegrating tablet Take 1 tablet (8 mg total) by mouth every 8 (eight) hours as needed. 09/21/23   Dorinda Drue DASEN, MD  oxyCODONE  (ROXICODONE ) 15 MG immediate release tablet Take 15 mg by mouth every 4 (four) hours as needed. 12/04/23   [provider]  pantoprazole  (PROTONIX ) 40 MG tablet Take 1 tablet (40 mg total) by mouth daily. Patient taking differently: Take 40 mg by mouth 2 (two) times daily. 09/21/23 09/20/24  Dorinda Drue DASEN, MD  Polyethyl Glycol-Propyl Glycol (SYSTANE) 0.4-0.3 % GEL ophthalmic gel Place 1 application  into both eyes 2 (two) times daily as needed (Eye dryness).    [provider]  Polyethyl Glycol-Propyl Glycol (SYSTANE) 0.4-0.3 % SOLN Apply 1 drop to eye in the morning, at noon, and at bedtime.    [provider]  polyethylene glycol (MIRALAX  / GLYCOLAX ) 17 g packet Take 17 g by mouth daily. Patient taking differently: Take 17 g by mouth daily as needed for moderate constipation. 09/21/23   Dorinda Drue DASEN, MD  Potassium Chloride  ER 20 MEQ TBCR Take 20 mEq by mouth daily as needed (when taking furosemide ).    [provider]  pravastatin  (PRAVACHOL ) 40 MG tablet Take 40 mg by mouth at bedtime.    [provider]  prednisoLONE  acetate (PRED FORTE ) 1 % ophthalmic suspension Place 1 drop into the left eye as needed (conjunctivitis).    [provider]  PRIVIGEN  40 GM/400ML SOLN Inject 40 g into the vein every 28 (twenty-eight) days. 11/02/23   Prince, Katherine E, FNP  prochlorperazine  (COMPAZINE ) 10 MG tablet Take 1 tablet (10 mg total) by mouth every 6 (six) hours as needed for nausea or vomiting. 09/07/23   Charlene Debby BROCKS, PA-C  promethazine  (PHENERGAN ) 25 MG tablet Take 1 tablet (25 mg total) by mouth every 6 (six) hours as needed for nausea or vomiting. 09/21/23   Dorinda Drue DASEN, MD  psyllium (HYDROCIL/METAMUCIL) 95 % PACK Take 1 packet by mouth daily. Patient taking differently: Take 1 packet by mouth daily as needed for  moderate constipation. 09/21/23   Dorinda Drue DASEN, MD  rizatriptan  (MAXALT -MLT) 10 MG disintegrating tablet Take 10 mg by mouth daily as needed for migraine.    [provider]  tiZANidine  (ZANAFLEX ) 2 MG tablet Take 2 mg by mouth 3 (three) times daily. 08/28/23   [provider]  topiramate  (TOPAMAX ) 100 MG tablet Take 100 mg by mouth daily. 06/18/19   [provider]  topiramate  (TOPAMAX ) 25 MG tablet Take 25 mg by mouth at bedtime.    [provider]  triamcinolone  lotion (KENALOG ) 0.1 % Apply 1 application topically daily as needed (rash). 07/07/20   [provider]  valACYclovir  (VALTREX ) 1000 MG tablet Take 1 tablet (1,000 mg total) by mouth daily. Patient taking differently: Take 500 mg by mouth 2 (two) times daily. 08/12/20   Fleeta Kathie Jomarie LOISE, MD    Allergies: Cyanoacrylate, Other, Sulfa antibiotics, Sulfonamide derivatives, Benadryl  [diphenhydramine  hcl], Sulfamethoxazole, Diphenhydramine , Diphenhydramine  hcl, and Silicone    Review of Systems  Neurological:  Positive for headaches.  Updated Vital Signs BP 120/81   Pulse (!) 121   Temp 98.6 F (37 C) (Oral)   Resp 15   Ht 5' 4 (1.626 m)   Wt 83.9 kg   SpO2 99%   BMI 31.76 kg/m   Physical Exam Vitals reviewed.  Constitutional:      General: She is not in acute distress. HENT:     Head: Normocephalic and atraumatic.     Mouth/Throat:     Comments: Intraoral/subligual shallow ulcerations that are tender to touch with erythematous base Neck:     Comments: No meningeal signs on examination and patient has full range of motion without pain or neck stiffness Cardiovascular:     Rate and Rhythm: Regular rhythm. Tachycardia present.     Heart sounds: Normal heart sounds. No murmur heard. Pulmonary:     Effort: Pulmonary effort is normal. No tachypnea.     Breath sounds: Normal breath sounds. No decreased air movement. No wheezing, rhonchi or rales.  Abdominal:     General:  There is no distension.     Palpations: Abdomen is soft.     Tenderness: There is generalized abdominal tenderness. There is no guarding or rebound.  Musculoskeletal:     Cervical back: Full passive range of motion without pain and normal range of motion. No spinous process tenderness or muscular tenderness.     Right lower leg: No edema.     Left lower leg: No edema.     Comments: Spontaneous movement of bilateral upper and lower extremities  Skin:    Comments: Disseminated facial and upper extremity rash with vesicular lesions of differing healing courses without evidence of secondary bacterial infection including erythema or purulent drainage  Neurological:     Mental Status: She is alert.  Psychiatric:        Behavior: Behavior is cooperative.     (all labs ordered are listed, but only abnormal results are displayed) Labs Reviewed  CBC WITH DIFFERENTIAL/PLATELET - Abnormal; Notable for the following components:      Result Value   RDW 16.7 (*)    Platelets 144 (*)    All other components within normal limits  COMPREHENSIVE METABOLIC PANEL WITH GFR - Abnormal; Notable for the following components:   CO2 21 (*)    Glucose, Bld 125 (*)    BUN 5 (*)    AST 68 (*)    ALT 89 (*)    All other components within normal limits  CBG MONITORING, ED - Abnormal; Notable for the following components:   Glucose-Capillary 134 (*)    All other components within normal limits  MAGNESIUM     EKG: EKG Interpretation Date/Time:  Thursday August 01 2024 15:46:19 EDT Ventricular Rate:  130 PR Interval:  107 QRS Duration:  78 QT Interval:  303 QTC Calculation: 446 R Axis:   -10  Text Interpretation: Sinus tachycardia Consider right atrial enlargement Abnormal R-wave progression, late transition Since last tracing rate faster Otherwise no significant change Confirmed by Emil Share [id number] on 08/01/2024 5:38:41 PM  Radiology: DG Chest 2 View Result Date: 08/01/2024 CLINICAL DATA:  Headache.  EXAM: CHEST - 2 VIEW COMPARISON:  December 10, 2023. FINDINGS: The heart size and mediastinal contours are within normal limits. Both lungs are clear. The visualized skeletal structures are unremarkable. IMPRESSION: No active cardiopulmonary disease. Electronically Signed   By: Lynwood Landy Raddle M.D.   On: 08/01/2024 16:24     Procedures   Medications Ordered in the ED  lactated ringers  bolus 1,000 mL (0 mLs Intravenous Stopped 08/01/24 1832)  acetaminophen  (TYLENOL ) tablet 650 mg (650 mg Oral Given 08/01/24 1716)  prochlorperazine  (COMPAZINE ) injection 10 mg (10 mg Intravenous Given 08/01/24 1714)  ondansetron  (ZOFRAN ) injection 4 mg (4 mg Intravenous Given 08/01/24 1711)  ketorolac  (TORADOL ) 15 MG/ML injection 15 mg (15 mg Intravenous Given 08/01/24 1713)  dexamethasone  (DECADRON ) injection 10 mg (10 mg Intravenous Given 08/01/24 1712)  magnesium  sulfate IVPB 2 g 50 mL (0 g Intravenous Stopped 08/01/24 1858)  lactated ringers  bolus 1,000 mL (0 mLs Intravenous Stopped 08/01/24 2125)  oxyCODONE  (Oxy IR/ROXICODONE ) immediate release tablet 10 mg (10 mg Oral Given 08/01/24 1832)  magic mouthwash (15 mLs Oral Given 08/01/24 2209)  oxyCODONE  (Oxy IR/ROXICODONE ) immediate release tablet 10 mg (10 mg Oral Given 08/01/24 2209)    Clinical Course as of 08/01/24 2240  Thu Aug 01, 2024  1615 Chest x-ray reviewed by me: Trachea is midline, no evidence of pneumothorax.  No pleural effusion, cardiomegaly, pulmonary edema or focal consolidation. [AG]  1616 EKG reviewed by me with evidence of sinus tachycardia with normal axis and intervals.  No evidence of acute ischemic change, heart block or dysrhythmia [AG]    Clinical Course User Index [AG] Nada Chroman, DO                                 Medical Decision Making Amount and/or Complexity of Data Reviewed Labs: ordered. Radiology: ordered.  Risk OTC drugs. Prescription drug management.   On initial valuation patient is tachycardic however normotensive and  afebrile.  Differential diagnosis include gastroenteritis, gastritis, electrolyte abnormality, arrhythmia, pneumonia, pneumothorax, ICH, acute headache.  Based on patient's history and physical examination findings, will give fluid bolus as well as headache cocktail and antiemetics.  Chest x-ray obtained without evidence of pneumothorax or pneumonia or other cardiopulmonary abnormality.  EKG sinus tachycardia without evidence of acute ischemic change or dysrhythmia.  On physical examination patient does not have focal abdominal tenderness therefore do not believe further imaging is necessary at this time.  At this time believe patient's presentation is secondary to fluid losses secondary to gastroenteritis with episodes of nonbloody and nonbilious emesis and nonbloody diarrhea.  On reevaluation patient's headache is improving, will give home pain aquatics for chronic pain as well.  Patient continues to be tachycardic although improved therefore will give second fluid bolus.  Headache is not completely resolved on reevaluation therefore we will give magnesium .  Patient reevaluated after second fluid bolus and magnesium  with significant improvement in headache and tachycardia with heart rate improved to 104.  Patient is tolerating p.o. intake at this time.  At this time believe patient is safe to be discharged home as she is hemodynamically stable, afebrile, not in acute distress, tolerating p.o. and is ambulatory.  Patient has antiemetics at home however will give magic oral flush due to intraoral lesions and pain with swallowing.  Patient was advised to follow-up with her primary care provider and given strict return precautions.  She agreed and understood plan of care at time of discharge     Final diagnoses:  Gastroenteritis    ED Discharge Orders     None       Chroman Nada DO  Emergency Medicine PGY2   Nada Chroman, DO 08/01/24 2241    Emil Share, DO 08/01/24 2309

## 2024-08-01 NOTE — ED Triage Notes (Signed)
 Pt arrived with EMS transport from home with c/o HA, n/v x 1 week worse over past few days. Pt w/hx migraines & meningitis.

## 2024-08-01 NOTE — Discharge Instructions (Signed)
 Thank you for allowing us  to care for you today.  Please be sure to take your antiemetics (Zofran , Compazine , Reglan , Phenergan  as needed.  Please use the Magic mouthwash for oral pain relief.  Please be sure to follow-up with your primary care doctor  Please return to the emergency department if you have worsening pain, inability to tolerate anything by mouth secondary to nausea and vomiting, develop chest pain, shortness of breath, passout or believe you need emergent medical care  Thank you for allowing us  to care for you today we hope you feel better soon

## 2024-08-14 ENCOUNTER — Ambulatory Visit: Admitting: Dermatology

## 2024-09-10 ENCOUNTER — Emergency Department

## 2024-09-10 ENCOUNTER — Inpatient Hospital Stay
Admission: EM | Admit: 2024-09-10 | Discharge: 2024-09-14 | DRG: 392 | Disposition: A | Discharge status: Home / Self Care | Attending: Internal Medicine | Admitting: Internal Medicine

## 2024-09-10 ENCOUNTER — Other Ambulatory Visit: Payer: Self-pay

## 2024-09-10 DIAGNOSIS — I82409 Acute embolism and thrombosis of unspecified deep veins of unspecified lower extremity: Secondary | ICD-10-CM | POA: Diagnosis present

## 2024-09-10 DIAGNOSIS — Z8619 Personal history of other infectious and parasitic diseases: Secondary | ICD-10-CM | POA: Diagnosis present

## 2024-09-10 DIAGNOSIS — Z8709 Personal history of other diseases of the respiratory system: Secondary | ICD-10-CM

## 2024-09-10 DIAGNOSIS — Z86718 Personal history of other venous thrombosis and embolism: Secondary | ICD-10-CM

## 2024-09-10 DIAGNOSIS — Z7951 Long term (current) use of inhaled steroids: Secondary | ICD-10-CM

## 2024-09-10 DIAGNOSIS — Z79631 Long term (current) use of antimetabolite agent: Secondary | ICD-10-CM

## 2024-09-10 DIAGNOSIS — Z923 Personal history of irradiation: Secondary | ICD-10-CM

## 2024-09-10 DIAGNOSIS — K3184 Gastroparesis: Secondary | ICD-10-CM | POA: Diagnosis present

## 2024-09-10 DIAGNOSIS — Z87892 Personal history of anaphylaxis: Secondary | ICD-10-CM

## 2024-09-10 DIAGNOSIS — K219 Gastro-esophageal reflux disease without esophagitis: Secondary | ICD-10-CM | POA: Diagnosis present

## 2024-09-10 DIAGNOSIS — E876 Hypokalemia: Secondary | ICD-10-CM | POA: Diagnosis present

## 2024-09-10 DIAGNOSIS — Z90710 Acquired absence of both cervix and uterus: Secondary | ICD-10-CM

## 2024-09-10 DIAGNOSIS — Z8616 Personal history of COVID-19: Secondary | ICD-10-CM

## 2024-09-10 DIAGNOSIS — I959 Hypotension, unspecified: Secondary | ICD-10-CM | POA: Diagnosis present

## 2024-09-10 DIAGNOSIS — Z800 Family history of malignant neoplasm of digestive organs: Secondary | ICD-10-CM

## 2024-09-10 DIAGNOSIS — E785 Hyperlipidemia, unspecified: Secondary | ICD-10-CM | POA: Diagnosis present

## 2024-09-10 DIAGNOSIS — R1084 Generalized abdominal pain: Principal | ICD-10-CM | POA: Diagnosis present

## 2024-09-10 DIAGNOSIS — J45909 Unspecified asthma, uncomplicated: Secondary | ICD-10-CM | POA: Diagnosis present

## 2024-09-10 DIAGNOSIS — Z91048 Other nonmedicinal substance allergy status: Secondary | ICD-10-CM

## 2024-09-10 DIAGNOSIS — K529 Noninfective gastroenteritis and colitis, unspecified: Secondary | ICD-10-CM | POA: Diagnosis present

## 2024-09-10 DIAGNOSIS — R112 Nausea with vomiting, unspecified: Secondary | ICD-10-CM | POA: Diagnosis not present

## 2024-09-10 DIAGNOSIS — Z888 Allergy status to other drugs, medicaments and biological substances status: Secondary | ICD-10-CM

## 2024-09-10 DIAGNOSIS — Z79899 Other long term (current) drug therapy: Secondary | ICD-10-CM

## 2024-09-10 DIAGNOSIS — Z8249 Family history of ischemic heart disease and other diseases of the circulatory system: Secondary | ICD-10-CM

## 2024-09-10 DIAGNOSIS — Z9049 Acquired absence of other specified parts of digestive tract: Secondary | ICD-10-CM

## 2024-09-10 DIAGNOSIS — Z882 Allergy status to sulfonamides status: Secondary | ICD-10-CM

## 2024-09-10 DIAGNOSIS — Z808 Family history of malignant neoplasm of other organs or systems: Secondary | ICD-10-CM

## 2024-09-10 DIAGNOSIS — E2740 Unspecified adrenocortical insufficiency: Secondary | ICD-10-CM | POA: Diagnosis present

## 2024-09-10 DIAGNOSIS — E271 Primary adrenocortical insufficiency: Secondary | ICD-10-CM | POA: Diagnosis present

## 2024-09-10 DIAGNOSIS — R197 Diarrhea, unspecified: Secondary | ICD-10-CM | POA: Diagnosis present

## 2024-09-10 DIAGNOSIS — Z8542 Personal history of malignant neoplasm of other parts of uterus: Secondary | ICD-10-CM

## 2024-09-10 DIAGNOSIS — M069 Rheumatoid arthritis, unspecified: Secondary | ICD-10-CM | POA: Diagnosis present

## 2024-09-10 DIAGNOSIS — G894 Chronic pain syndrome: Secondary | ICD-10-CM | POA: Diagnosis present

## 2024-09-10 DIAGNOSIS — Z83719 Family history of colon polyps, unspecified: Secondary | ICD-10-CM

## 2024-09-10 LAB — COMPREHENSIVE METABOLIC PANEL WITH GFR
ALT: 40 U/L (ref 0–44)
AST: 42 U/L — ABNORMAL HIGH (ref 15–41)
Albumin: 4.1 g/dL (ref 3.5–5.0)
Alkaline Phosphatase: 103 U/L (ref 38–126)
Anion gap: 13 (ref 5–15)
BUN: 7 mg/dL (ref 6–20)
CO2: 28 mmol/L (ref 22–32)
Calcium: 9 mg/dL (ref 8.9–10.3)
Chloride: 102 mmol/L (ref 98–111)
Creatinine, Ser: 0.68 mg/dL (ref 0.44–1.00)
GFR, Estimated: >60 mL/min (ref >60)
Glucose, Bld: 104 mg/dL — ABNORMAL HIGH (ref 70–99)
Potassium: 2.9 mmol/L — ABNORMAL LOW (ref 3.5–5.1)
Sodium: 143 mmol/L (ref 135–145)
Total Bilirubin: 1.1 mg/dL (ref 0.0–1.2)
Total Protein: 7.2 g/dL (ref 6.5–8.1)

## 2024-09-10 LAB — CBC
HCT: 39.1 % (ref 36.0–46.0)
Hemoglobin: 13.7 g/dL (ref 12.0–15.0)
MCH: 33.9 pg (ref 26.0–34.0)
MCHC: 35 g/dL (ref 30.0–36.0)
MCV: 96.8 fL (ref 80.0–100.0)
Platelets: 251 K/uL (ref 150–400)
RBC: 4.04 MIL/uL (ref 3.87–5.11)
RDW: 16.6 % — ABNORMAL HIGH (ref 11.5–15.5)
WBC: 11.6 K/uL — ABNORMAL HIGH (ref 4.0–10.5)
nRBC: 0 % (ref 0.0–0.2)

## 2024-09-10 LAB — URINALYSIS, ROUTINE W REFLEX MICROSCOPIC
Bilirubin Urine: NEGATIVE
Glucose, UA: NEGATIVE mg/dL
Hgb urine dipstick: NEGATIVE
Ketones, ur: NEGATIVE mg/dL
Leukocytes,Ua: NEGATIVE
Nitrite: NEGATIVE
Protein, ur: NEGATIVE mg/dL
Specific Gravity, Urine: 1.026 (ref 1.005–1.030)
pH: 7 (ref 5.0–8.0)

## 2024-09-10 LAB — LIPASE, BLOOD: Lipase: 28 U/L (ref 11–51)

## 2024-09-10 LAB — MAGNESIUM: Magnesium: 1.2 mg/dL — ABNORMAL LOW (ref 1.7–2.4)

## 2024-09-10 LAB — HCG, QUANTITATIVE, PREGNANCY: hCG, Beta Chain, Quant, S: 2 m[IU]/mL (ref <5)

## 2024-09-10 MED ORDER — POTASSIUM CHLORIDE 10 MEQ/100ML IV SOLN
10.0000 meq | INTRAVENOUS | Status: AC
  Administered 2024-09-10 (×2): 10 meq via INTRAVENOUS
  Filled 2024-09-10 (×3): qty 100

## 2024-09-10 MED ORDER — PRAVASTATIN SODIUM 20 MG PO TABS
40.0000 mg | ORAL_TABLET | Freq: Every day | ORAL | Status: DC
  Administered 2024-09-10 – 2024-09-13 (×4): 40 mg via ORAL
  Filled 2024-09-10 (×4): qty 2

## 2024-09-10 MED ORDER — FAMOTIDINE 20 MG PO TABS
40.0000 mg | ORAL_TABLET | Freq: Every day | ORAL | Status: DC
  Administered 2024-09-10 – 2024-09-13 (×4): 40 mg via ORAL
  Filled 2024-09-10 (×4): qty 2

## 2024-09-10 MED ORDER — POTASSIUM CHLORIDE 10 MEQ/100ML IV SOLN
10.0000 meq | INTRAVENOUS | Status: AC
  Administered 2024-09-10: 10 meq via INTRAVENOUS

## 2024-09-10 MED ORDER — METHOTREXATE SODIUM 2.5 MG PO TABS
15.0000 mg | ORAL_TABLET | ORAL | Status: DC
  Administered 2024-09-13: 15 mg via ORAL
  Filled 2024-09-10: qty 6

## 2024-09-10 MED ORDER — CYCLOSPORINE 0.05 % OP EMUL
1.0000 [drp] | Freq: Two times a day (BID) | OPHTHALMIC | Status: DC
  Administered 2024-09-10 – 2024-09-13 (×8): 1 [drp] via OPHTHALMIC
  Filled 2024-09-10 (×10): qty 30

## 2024-09-10 MED ORDER — IOHEXOL 300 MG/ML  SOLN
100.0000 mL | Freq: Once | INTRAMUSCULAR | Status: AC | PRN
Start: 2024-09-10 — End: 2024-09-10
  Administered 2024-09-10: 100 mL via INTRAVENOUS

## 2024-09-10 MED ORDER — BUSPIRONE HCL 10 MG PO TABS
10.0000 mg | ORAL_TABLET | Freq: Two times a day (BID) | ORAL | Status: DC
  Administered 2024-09-10 – 2024-09-14 (×9): 10 mg via ORAL
  Filled 2024-09-10 (×5): qty 1
  Filled 2024-09-10: qty 2
  Filled 2024-09-10 (×3): qty 1

## 2024-09-10 MED ORDER — MAGNESIUM SULFATE 2 GM/50ML IV SOLN
2.0000 g | Freq: Once | INTRAVENOUS | Status: AC
  Administered 2024-09-10: 2 g via INTRAVENOUS
  Filled 2024-09-10: qty 50

## 2024-09-10 MED ORDER — ENOXAPARIN SODIUM 40 MG/0.4ML IJ SOSY
40.0000 mg | PREFILLED_SYRINGE | INTRAMUSCULAR | Status: DC
  Administered 2024-09-10 – 2024-09-14 (×5): 40 mg via SUBCUTANEOUS
  Filled 2024-09-10 (×5): qty 0.4

## 2024-09-10 MED ORDER — OXYCODONE HCL 5 MG PO TABS
15.0000 mg | ORAL_TABLET | ORAL | Status: DC | PRN
  Administered 2024-09-10 – 2024-09-14 (×18): 15 mg via ORAL
  Filled 2024-09-10 (×18): qty 3

## 2024-09-10 MED ORDER — POTASSIUM CHLORIDE CRYS ER 20 MEQ PO TBCR
40.0000 meq | EXTENDED_RELEASE_TABLET | Freq: Once | ORAL | Status: AC
Start: 2024-09-10 — End: 2024-09-10
  Administered 2024-09-10: 40 meq via ORAL
  Filled 2024-09-10: qty 2

## 2024-09-10 MED ORDER — SODIUM CHLORIDE 0.9 % IV SOLN
12.5000 mg | Freq: Four times a day (QID) | INTRAVENOUS | Status: DC | PRN
  Administered 2024-09-10 – 2024-09-12 (×2): 12.5 mg via INTRAVENOUS
  Filled 2024-09-10 (×2): qty 12.5

## 2024-09-10 MED ORDER — FOLIC ACID 1 MG PO TABS
1.0000 mg | ORAL_TABLET | Freq: Every day | ORAL | Status: DC
  Administered 2024-09-10 – 2024-09-14 (×5): 1 mg via ORAL
  Filled 2024-09-10 (×5): qty 1

## 2024-09-10 MED ORDER — MORPHINE SULFATE (PF) 4 MG/ML IV SOLN
4.0000 mg | INTRAVENOUS | Status: DC | PRN
  Administered 2024-09-10 – 2024-09-13 (×11): 4 mg via INTRAVENOUS
  Filled 2024-09-10 (×11): qty 1

## 2024-09-10 MED ORDER — ONDANSETRON HCL 4 MG/2ML IJ SOLN
4.0000 mg | Freq: Once | INTRAMUSCULAR | Status: AC
  Administered 2024-09-10: 4 mg via INTRAVENOUS
  Filled 2024-09-10: qty 2

## 2024-09-10 MED ORDER — METOCLOPRAMIDE HCL 10 MG PO TABS
10.0000 mg | ORAL_TABLET | Freq: Three times a day (TID) | ORAL | Status: DC
  Administered 2024-09-10 – 2024-09-12 (×8): 10 mg via ORAL
  Filled 2024-09-10 (×10): qty 1

## 2024-09-10 MED ORDER — PANTOPRAZOLE SODIUM 40 MG PO TBEC
40.0000 mg | DELAYED_RELEASE_TABLET | Freq: Two times a day (BID) | ORAL | Status: DC
  Administered 2024-09-10: 40 mg via ORAL
  Filled 2024-09-10: qty 1

## 2024-09-10 MED ORDER — FUROSEMIDE 40 MG PO TABS: 60.0000 mg | ORAL_TABLET | Freq: Two times a day (BID) | ORAL | Status: DC | PRN

## 2024-09-10 MED ORDER — KETOROLAC TROMETHAMINE 15 MG/ML IJ SOLN
15.0000 mg | Freq: Once | INTRAMUSCULAR | Status: AC
Start: 2024-09-10 — End: 2024-09-10
  Administered 2024-09-10: 15 mg via INTRAVENOUS
  Filled 2024-09-10: qty 1

## 2024-09-10 MED ORDER — ONDANSETRON HCL 4 MG/2ML IJ SOLN
4.0000 mg | Freq: Four times a day (QID) | INTRAMUSCULAR | Status: DC | PRN
  Administered 2024-09-10 – 2024-09-14 (×6): 4 mg via INTRAVENOUS
  Filled 2024-09-10 (×7): qty 2

## 2024-09-10 MED ORDER — ALBUTEROL SULFATE (2.5 MG/3ML) 0.083% IN NEBU
2.5000 mg | INHALATION_SOLUTION | RESPIRATORY_TRACT | Status: DC | PRN
  Administered 2024-09-11: 2.5 mg via RESPIRATORY_TRACT
  Filled 2024-09-10: qty 3

## 2024-09-10 MED ORDER — PROCHLORPERAZINE MALEATE 10 MG PO TABS: 10.0000 mg | ORAL_TABLET | Freq: Four times a day (QID) | ORAL | Status: DC | PRN

## 2024-09-10 MED ORDER — GABAPENTIN 250 MG/5ML PO SOLN
200.0000 mg | Freq: Three times a day (TID) | ORAL | Status: DC
  Administered 2024-09-10 – 2024-09-13 (×9): 200 mg via ORAL
  Filled 2024-09-10 (×11): qty 4

## 2024-09-10 MED ORDER — SODIUM CHLORIDE 0.9 % IV SOLN: INTRAVENOUS | Status: DC

## 2024-09-10 MED ORDER — HYDROCORTISONE SOD SUC (PF) 100 MG IJ SOLR
50.0000 mg | Freq: Once | INTRAMUSCULAR | Status: AC
Start: 2024-09-10 — End: 2024-09-10
  Administered 2024-09-10: 50 mg via INTRAVENOUS
  Filled 2024-09-10: qty 1

## 2024-09-10 MED ORDER — SERTRALINE HCL 50 MG PO TABS
25.0000 mg | ORAL_TABLET | Freq: Every day | ORAL | Status: DC
  Administered 2024-09-10 – 2024-09-14 (×5): 25 mg via ORAL
  Filled 2024-09-10 (×5): qty 1

## 2024-09-10 MED ORDER — METHOTREXATE SODIUM 2.5 MG PO TABS: 5.0000 mg | ORAL_TABLET | ORAL | Status: DC

## 2024-09-10 MED ORDER — LACTULOSE 10 GM/15ML PO SOLN
20.0000 g | Freq: Every day | ORAL | Status: DC | PRN
  Administered 2024-09-13: 20 g via ORAL
  Filled 2024-09-10: qty 30

## 2024-09-10 MED ORDER — SODIUM CHLORIDE 0.9 % IV BOLUS
1000.0000 mL | Freq: Once | INTRAVENOUS | Status: AC
  Administered 2024-09-10: 1000 mL via INTRAVENOUS

## 2024-09-10 MED ORDER — VALACYCLOVIR HCL 500 MG PO TABS
500.0000 mg | ORAL_TABLET | Freq: Two times a day (BID) | ORAL | Status: DC
Start: 2024-09-10 — End: 2024-09-14
  Administered 2024-09-10 – 2024-09-14 (×9): 500 mg via ORAL
  Filled 2024-09-10 (×9): qty 1

## 2024-09-10 MED ORDER — MIDODRINE HCL 5 MG PO TABS
10.0000 mg | ORAL_TABLET | Freq: Three times a day (TID) | ORAL | Status: DC
  Administered 2024-09-10 – 2024-09-14 (×11): 10 mg via ORAL
  Filled 2024-09-10 (×11): qty 2

## 2024-09-10 MED ORDER — MORPHINE SULFATE (PF) 4 MG/ML IV SOLN
4.0000 mg | Freq: Once | INTRAVENOUS | Status: AC
  Administered 2024-09-10: 4 mg via INTRAVENOUS
  Filled 2024-09-10: qty 1

## 2024-09-10 NOTE — H&P (Addendum)
 History and Physical    [name] Horn FMW:[id number] DOB: 24-Mar-1976 DOA: 09/10/2024  DOS: the patient was seen and examined on 09/10/2024  PCP: [facility]   Patient coming from: Home  I have personally briefly reviewed patient's old medical records in [facility]  Chief Complaint: Abdominal pain nausea vomiting  HPI: [name] Horn is a pleasant 48 y.o. female with medical history significant for history of DVT in 2012 s/p completion of anticoagulation treatment, adrenal insufficiency, rheumatoid arthritis, chronic colitis, herpes labialis, gastroparesis, inflammatory bowel disease who came into ED complaining of abdominal pain nausea and vomiting and diarrhea for several days.  Patient stated that her abdominal pain is crampy in nature, 8/10 in intensity, diffuse, nonradiating not able to tolerate p.o. intake.  She denies any fever or chills, denies any dysuria, hematuria, hematemesis, melena.  She denies any chest pain, palpitations, shortness of breath.  She denies any recent travel.  But she has taken doxycycline recently.  Patient is stated that she has been taking her medications as prescribed.  ED Course: Upon arrival to the ED, patient is found to be tachycardic at 115, tachypneic around 23, potassium 2.9, magnesium  1.2, leukocytes at 11.6.  Patient received 1 dose of Solu-Cortef  100 mg IV, potassium chloride , magnesium  sulfate, IV fluids, Zofran , morphine , Phenergan , ketorolac  and hospitalist service was consulted for evaluation for admission for nausea vomiting and abdominal pain.  Review of Systems:  ROS  All other systems negative except as noted in the HPI.  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   Herpes labialis 08/12/2020   Hypokalemia 05/01/2023   IBD (inflammatory bowel disease)    Intertrigo 06/22/2020   Leukopenia 07/06/2021   Lymphedema    Malaise 12/30/2020   RA (rheumatoid arthritis) (HCC)    Rheumatoid arthritis (HCC) 08/12/2020   Transaminitis 08/12/2020   Uterine cancer [facility])     Past Surgical History:  Procedure Laterality Date   APPENDECTOMY     cheek biopsy  11/2020   CHOLECYSTECTOMY     ESOPHAGOGASTRODUODENOSCOPY N/A 04/30/2017   Procedure: ESOPHAGOGASTRODUODENOSCOPY (EGD);  Surgeon: Dyane Rush, MD;  Location: [facility] ENDOSCOPY;  Service: Endoscopy;  Laterality: N/A;   HERNIA REPAIR     x2   IR LUMBAR PUNCTURE  12/11/2023   KNEE SURGERY     x3   MINIMALLY INVASIVE FORAMINOTOMY CERVICAL SPINE     C6-T1, Nitka   SAVORY DILATION N/A 04/30/2017   Procedure: SAVORY DILATION;  Surgeon: Dyane Rush, MD;  Location: [facility] ENDOSCOPY;  Service: Endoscopy;  Laterality: N/A;   TONSILLECTOMY     TOTAL ABDOMINAL HYSTERECTOMY     Sarcoma, s/p XRT     reports that she has never smoked. She has been exposed to tobacco smoke. She has never used smokeless tobacco. She reports that she does not currently use alcohol . She reports current drug use.  Allergies  Allergen Reactions   Cyanoacrylate Hives   Other Itching and Rash    Dermabond surgical glue.  Dermabond surgical glue-blisters   Sulfa Antibiotics Anaphylaxis, Rash, Shortness Of Breath and Swelling  Angioedema (also)   Sulfonamide Derivatives Hives, Shortness Of Breath and Swelling    TONGUE SWELLS   Benadryl  [Diphenhydramine  Hcl] Other (See Comments)    Hyperactivity    Sulfamethoxazole     Other Reaction(s): Angioedema   Diphenhydramine     Diphenhydramine  Hcl     Other Reaction(s): Other (See Comments)  HYPERACTIVITY, RESTLESS LEG   Silicone Rash    Watch band    Family History  Problem Relation  Age of Onset   Hashimoto's thyroiditis Mother    Eczema Mother    Angioedema Mother    Colonic polyp Mother    Throat cancer Father    Arrhythmia Father    Angioedema Maternal Grandfather    Hypertension Other    Hyperlipidemia Other    Colon cancer Other        grandmother    Prior to Admission medications   Medication Sig Start Date End Date Taking? Authorizing Provider  albuterol  (PROVENTIL ) (2.5 MG/3ML) 0.083% nebulizer solution Take 3 mLs (2.5 mg total) by nebulization every 4 (four) hours as needed for wheezing or shortness of breath. 11/20/22  Yes Pahwani, Rinka R, MD  albuterol  (VENTOLIN  HFA) 108 (90 Base) MCG/ACT inhaler Inhale 1 puff into the lungs every 4 (four) hours as needed. 04/12/23  Yes [provider]  atropine  1 % ophthalmic solution Place 1 drop into both eyes 3 (three) times daily as needed.   Yes [provider]  BELSOMRA  10 MG TABS Take 1 tablet by mouth at bedtime.   Yes [provider]  budesonide -formoterol  (SYMBICORT ) 80-4.5 MCG/ACT inhaler Inhale 2 puffs into the lungs 2 (two) times daily. Can also take Q4 hr as needed for wheezing 11/17/22  Yes Adhikari, Ivonne, MD  busPIRone  (BUSPAR ) 10 MG tablet Take 10 mg by mouth 2 (two) times daily.   Yes [provider]  Cholecalciferol 1.25 MG (50000 UT) capsule Take 50,000 Units by mouth once a week. 11/28/23 11/27/24 Yes [provider]  cycloSPORINE  (RESTASIS ) 0.05 % ophthalmic emulsion Place 1 drop into both eyes 2 (two) times daily.   Yes [provider]  dicyclomine  (BENTYL ) 10 MG capsule Take 10 mg by mouth daily as needed for nausea/vomiting. 01/02/19  Yes [provider]  famotidine  (PEPCID ) 40 MG tablet Take 40 mg by mouth at bedtime. 05/02/21  Yes [provider]  folic acid  (FOLVITE ) 1 MG tablet Take 2 tablets (2 mg total) by mouth daily. Patient taking differently: Take 1 mg by mouth daily. 10/30/19  Yes Deveshwar, Maya, MD  furosemide  (LASIX )  20 MG tablet Take 60 mg by mouth 2 (two) times daily as needed for fluid or edema.   Yes [provider]  gabapentin  (NEURONTIN ) 250 MG/5ML solution Take by mouth 3 (three) times daily.   Yes [provider]  methotrexate  (RHEUMATREX) 5 MG tablet Take 5 mg by mouth once a week. Caution: Chemotherapy. Protect from light.   Yes [provider]  naloxone  (NARCAN ) nasal spray 4 mg/0.1 mL Place 1 spray into the nose once. 02/13/23  Yes [provider]  nystatin  (MYCOSTATIN ) 100000 UNIT/ML suspension Take 10 mLs (1,000,000 Units total) by mouth 3 (three) times daily. Patient taking differently: Take 10 mLs by mouth 3 (three) times daily as needed (Mouth sores). 12/07/20  Yes Fleeta Kathie Jomarie LOISE, MD  nystatin  powder Apply 1 application topically 2 (two) times daily as needed (rash).   Yes [provider]  ondansetron  (ZOFRAN -ODT) 8 MG disintegrating tablet Take 1 tablet (8  mg total) by mouth every 8 (eight) hours as needed. 09/21/23  Yes Dorinda Drue DASEN, MD  oxyCODONE  (ROXICODONE ) 15 MG immediate release tablet Take 15 mg by mouth every 4 (four) hours as needed. 12/04/23  Yes [provider]  oxyCODONE -acetaminophen  (PERCOCET) 10-325 MG tablet Take 1 tablet by mouth 5 (five) times daily as needed.   Yes [provider]  pantoprazole  (PROTONIX ) 40 MG tablet Take 1 tablet (40 mg total) by mouth daily. Patient taking differently: Take 40 mg by mouth 2 (two) times daily. 09/21/23 09/20/24 Yes Djan, Drue DASEN, MD  Polyethyl Glycol-Propyl Glycol (SYSTANE) 0.4-0.3 % GEL ophthalmic gel Place 1 application  into both eyes 2 (two) times daily as needed (Eye dryness).   Yes [provider]  Polyethyl Glycol-Propyl Glycol (SYSTANE) 0.4-0.3 % SOLN Apply 1 drop to eye in the morning, at noon, and at bedtime.   Yes [provider]  polyethylene glycol (MIRALAX  / GLYCOLAX ) 17 g packet Take 17 g by mouth daily. Patient taking differently: Take 17 g by  mouth daily as needed for moderate constipation. 09/21/23  Yes Dorinda Drue DASEN, MD  Potassium Chloride  ER 20 MEQ TBCR Take 20 mEq by mouth daily as needed (when taking furosemide ).   Yes [provider]  prochlorperazine  (COMPAZINE ) 10 MG tablet Take 1 tablet (10 mg total) by mouth every 6 (six) hours as needed for nausea or vomiting. 09/07/23  Yes Charlene Debby BROCKS, PA-C  promethazine  (PHENERGAN ) 25 MG tablet Take 1 tablet (25 mg total) by mouth every 6 (six) hours as needed for nausea or vomiting. 09/21/23  Yes Dorinda Drue DASEN, MD  rizatriptan  (MAXALT -MLT) 10 MG disintegrating tablet Take 10 mg by mouth daily as needed for migraine.   Yes [provider]  sertraline  (ZOLOFT ) 25 MG tablet Take 25 mg by mouth daily. 03/23/24  Yes [provider]  SUMAtriptan  (IMITREX ) 25 MG tablet Take 25 mg by mouth every 2 (two) hours as needed.   Yes [provider]  tiZANidine  (ZANAFLEX ) 2 MG tablet Take 2 mg by mouth 3 (three) times daily. 08/28/23  Yes [provider]  amphetamine -dextroamphetamine  (ADDERALL) 20 MG tablet Take 20 mg by mouth 3 (three) times daily. Patient not taking: Reported on 09/10/2024    [provider]  hydrOXYzine  (ATARAX ) 25 MG tablet Take 1 tablet (25 mg total) by mouth 2 (two) times daily. Patient taking differently: Take 25 mg by mouth 2 (two) times daily as needed for itching or anxiety. 09/21/23   Dorinda Drue DASEN, MD  lactulose  (CHRONULAC ) 10 GM/15ML solution Take 30 mLs (20 g total) by mouth daily as needed for mild constipation. 09/21/23   Dorinda Drue DASEN, MD  midodrine  (PROAMATINE ) 10 MG tablet Take 1 tablet (10 mg total) by mouth 3 (three) times daily with meals. 12/15/23   Amin, Ankit C, MD  mupirocin  ointment (BACTROBAN ) 2 % Apply 1 Application topically 2 (two) times daily. 07/26/24   Fisher, Devere ORN, PA-C  pravastatin  (PRAVACHOL ) 40 MG tablet Take 40 mg by mouth at bedtime.    [provider]  prednisoLONE  acetate (PRED FORTE )  1 % ophthalmic suspension Place 1 drop into the left eye as needed (conjunctivitis).    [provider]  PRIVIGEN  40 GM/400ML SOLN Inject 40 g into the vein every 28 (twenty-eight) days. Patient not taking: Reported on 09/10/2024 11/02/23   Prince, Katherine E, FNP  psyllium (HYDROCIL/METAMUCIL) 95 % PACK Take 1 packet by mouth daily. Patient taking differently: Take 1  packet by mouth daily as needed for moderate constipation. 09/21/23   Dorinda Drue DASEN, MD  topiramate  (TOPAMAX ) 100 MG tablet Take 100 mg by mouth daily. Patient not taking: Reported on 09/10/2024 06/18/19   [provider]  topiramate  (TOPAMAX ) 25 MG tablet Take 25 mg by mouth at bedtime. Patient not taking: Reported on 09/10/2024    [provider]  triamcinolone  lotion (KENALOG ) 0.1 % Apply 1 application topically daily as needed (rash). Patient not taking: Reported on 09/10/2024 07/07/20   [provider]  valACYclovir  (VALTREX ) 1000 MG tablet Take 1 tablet (1,000 mg total) by mouth daily. Patient taking differently: Take 500 mg by mouth 2 (two) times daily. 08/12/20   Fleeta Kathie Jomarie LOISE, MD    Physical Exam: Vitals:   09/10/24 0400 09/10/24 0730 09/10/24 0757 09/10/24 0830  BP: 129/82 120/78  116/73  Pulse: 96 85  82  Resp: (!) 23 18  (!) 21  Temp:   98.4 F (36.9 C)   TempSrc:   Oral   SpO2: 97% 100%  100%  Weight:      Height:        Physical Exam   Constitutional: Alert, awake, calm, comfortable HEENT: Neck supple Respiratory: Clear to auscultation B/L, no wheezing, no rales.  Cardiovascular: Regular rate and rhythm, no murmurs / rubs / gallops. No extremity edema. 2+ pedal pulses. No carotid bruits.  Abdomen: Soft, no tenderness, Bowel sounds positive.  Musculoskeletal: no clubbing / cyanosis. Good ROM, no contractures. Normal muscle tone.  Skin: no rashes, lesions, ulcers. Neurologic: CN 2-12 grossly intact. Sensation intact, No focal deficit identified Psychiatric: Alert and  oriented x 3. Normal mood.    Labs on Admission: I have personally reviewed following labs and imaging studies  CBC: Recent Labs  Lab 09/10/24 0059  WBC 11.6*  HGB 13.7  HCT 39.1  MCV 96.8  PLT 251   Basic Metabolic Panel: Recent Labs  Lab 09/10/24 0059  NA 143  K 2.9*  CL 102  CO2 28  GLUCOSE 104*  BUN 7  CREATININE 0.68  CALCIUM  9.0  MG 1.2*   GFR: Estimated Creatinine Clearance: 89.8 mL/min (by C-G formula based on SCr of 0.68 mg/dL). Liver Function Tests: Recent Labs  Lab 09/10/24 0059  AST 42*  ALT 40  ALKPHOS 103  BILITOT 1.1  PROT 7.2  ALBUMIN 4.1   Recent Labs  Lab 09/10/24 0059  LIPASE 28   No results for input(s): AMMONIA in the last 168 hours. Coagulation Profile: No results for input(s): INR, PROTIME in the last 168 hours. Cardiac Enzymes: No results for input(s): CKTOTAL, CKMB, CKMBINDEX, TROPONINI, TROPONINIHS in the last 168 hours. BNP (last 3 results) No results for input(s): BNP in the last 8760 hours. HbA1C: No results for input(s): HGBA1C in the last 72 hours. CBG: No results for input(s): GLUCAP in the last 168 hours. Lipid Profile: No results for input(s): CHOL, HDL, LDLCALC, TRIG, CHOLHDL, LDLDIRECT in the last 72 hours. Thyroid Function Tests: No results for input(s): TSH, T4TOTAL, FREET4, T3FREE, THYROIDAB in the last 72 hours. Anemia Panel: No results for input(s): VITAMINB12, FOLATE, FERRITIN, TIBC, IRON, RETICCTPCT in the last 72 hours. Urine analysis:    Component Value Date/Time   COLORURINE COLORLESS (A) 09/10/2024 0409   APPEARANCEUR CLEAR (A) 09/10/2024 0409   LABSPEC 1.026 09/10/2024 0409   PHURINE 7.0 09/10/2024 0409   GLUCOSEU NEGATIVE 09/10/2024 0409   HGBUR NEGATIVE 09/10/2024 0409   HGBUR negative 07/25/2007 1131  BILIRUBINUR NEGATIVE 09/10/2024 0409   KETONESUR NEGATIVE 09/10/2024 0409   PROTEINUR NEGATIVE 09/10/2024 0409   UROBILINOGEN 1.0  05/27/2009 1500   NITRITE NEGATIVE 09/10/2024 0409   LEUKOCYTESUR NEGATIVE 09/10/2024 0409    Radiological Exams on Admission: I have personally reviewed images CT ABDOMEN PELVIS W CONTRAST Result Date: 09/10/2024 EXAM: CT ABDOMEN AND PELVIS WITH CONTRAST 09/10/2024 03:22:53 AM TECHNIQUE: CT of the abdomen and pelvis was performed with the administration of intravenous contrast. Multiplanar reformatted images are provided for review. Automated exposure control, iterative reconstruction, and/or weight-based adjustment of the mA/kV was utilized to reduce the radiation dose to as low as reasonably achievable. COMPARISON: None available. CLINICAL HISTORY: IBD abd pain nvd. Table formatting from the original note was not included. Images from the original note were not included. Pt reports over past few days abd pain n/v/d, pt has hx colitis. FINDINGS: LOWER CHEST: No acute abnormality. LIVER: The liver is unremarkable. GALLBLADDER AND BILE DUCTS: Status post cholecystectomy. No biliary ductal dilatation. SPLEEN: No acute abnormality. PANCREAS: No acute abnormality. ADRENAL GLANDS: No acute abnormality. KIDNEYS, URETERS AND BLADDER: No stones in the kidneys or ureters. No hydronephrosis. No perinephric or periureteral stranding. Urinary bladder is unremarkable. GI AND BOWEL: Status post appendectomy. The stomach, small bowel, and large bowel are otherwise unremarkable. PERITONEUM AND RETROPERITONEUM: No ascites. No free air. VASCULATURE: Aorta is normal in caliber. LYMPH NODES: No lymphadenopathy. REPRODUCTIVE ORGANS: Status post hysterectomy, bilateral oophorectomy and pelvic lymph node dissection. No adnexal masses are seen. BONES AND SOFT TISSUES: No acute osseous abnormality. No focal soft tissue abnormality. IMPRESSION: 1. No acute findings in the abdomen or pelvis. Electronically signed by: Dorethia Molt MD 09/10/2024 03:30 AM EDT RP Workstation: [id number]    EKG: N/A    Assessment/Plan Principal  Problem:   Intractable nausea and vomiting Active Problems:   RA (rheumatoid arthritis) (HCC)   HLD (hyperlipidemia)   History of asthma   Acute thromboembolism of deep veins of lower extremity (HCC)   Nausea & vomiting   History of shingles    Assessment and Plan: 48 year old female with multiple medical problems including but not limited to rheumatoid arthritis, Addison's disease, HLD, asthma, history of shingles, DVT who came into ED complaining of nausea vomiting abdomen pain for more than a week duration.  Patient stated that she was told that she has a history of chronic colitis that could have been the cause.  1.  Nonspecific nausea vomiting abdominal pain. - She will be placed in observation - She has multiple antinausea medication at home by mouth - CT scan of the abdomen and pelvis did not show any obstruction or colitis - Will give her n.p.o., IV fluid, antinausea medication, pain medication - Will replace electrolytes   2.  Hypokalemia/hypomagnesemia - Potassium and magnesium  has been replaced - Will check magnesium  and potassium level and replace as needed  3.  History of rheumatoid arthritis - Stable - Continue home medications including methotrexate   4.  History of shingles - Continue home dose of acyclovir   5.  Chronic pain syndrome - Patient is on gabapentin  which will be continued - Patient takes oxycodone  15 mg every 4 hours as needed, continued  6.  GERD - Continue Pepcid   7.  Hypotension - Patient is on midodrine , continue that       DVT prophylaxis: Lovenox  Code Status: Full Code Family Communication: None available Disposition Plan: Home Consults called: None Admission status: Observation, Med-Surg   Nena Rebel, MD [facility] 09/10/2024, 9:19  AM

## 2024-09-10 NOTE — ED Provider Notes (Signed)
 [facility] Provider Note    Event Date/Time   First MD Initiated Contact with Patient 09/10/24 0225     (approximate)   History   Abdominal Pain   HPI  [name] Horn is a 48 y.o. female   Past medical history of adrenal insufficiency, history of DVT, here with nausea vomiting diarrhea for several days.  Crampy abdominal pain.  Poor p.o. intake.  No fevers no chills no urinary symptoms.  Has been taking her steroids as prescribed.  No chest pain or shortness of breath.  No other acute medical complaints.  No recent foreign travel.  Has taken antibiotics doxycycline recently.  She is concerned of C. difficile as she has had this before.   External Medical Documents Reviewed: Recent endocrinology note      Physical Exam   Triage Vital Signs: ED Triage Vitals  Encounter Vitals Group     BP 09/10/24 0059 131/89     Girls Systolic BP Percentile --      Girls Diastolic BP Percentile --      Boys Systolic BP Percentile --      Boys Diastolic BP Percentile --      Pulse Rate 09/10/24 0059 (!) 115     Resp 09/10/24 0058 18     Temp 09/10/24 0058 98.6 F (37 C)     Temp src --      SpO2 09/10/24 0059 100 %     Weight 09/10/24 0057 188 lb (85.3 kg)     Height 09/10/24 0057 5' 4 (1.626 m)     Head Circumference --      Peak Flow --      Pain Score 09/10/24 0057 8     Pain Loc --      Pain Education --      Exclude from Growth Chart --     Most recent vital signs: Vitals:   09/10/24 0330 09/10/24 0400  BP: 121/82 129/82  Pulse: 88 96  Resp: 19 (!) 23  Temp:    SpO2: 100% 97%    General: Awake, no distress.  CV:  Good peripheral perfusion.  Resp:  Normal effort.  Abd:  No distention.  Other:  Normal vital signs.  Diffuse mild nonperitoneal nonfocal abdominal tenderness to palpation.   ED Results / Procedures / Treatments   Labs (all labs ordered are listed, but only abnormal results are displayed) Labs Reviewed  COMPREHENSIVE  METABOLIC PANEL WITH GFR - Abnormal; Notable for the following components:      Result Value   Potassium 2.9 (*)    Glucose, Bld 104 (*)    AST 42 (*)    All other components within normal limits  CBC - Abnormal; Notable for the following components:   WBC 11.6 (*)    RDW 16.6 (*)    All other components within normal limits  URINALYSIS, ROUTINE W REFLEX MICROSCOPIC - Abnormal; Notable for the following components:   Color, Urine COLORLESS (*)    APPearance CLEAR (*)    All other components within normal limits  MAGNESIUM  - Abnormal; Notable for the following components:   Magnesium  1.2 (*)    All other components within normal limits  GASTROINTESTINAL PANEL BY PCR, STOOL (REPLACES STOOL CULTURE)  C DIFFICILE QUICK SCREEN W PCR REFLEX    LIPASE, BLOOD  HCG, QUANTITATIVE, PREGNANCY     I ordered and reviewed the above labs they are notable for magnesium  and potassium are low.  White blood cell count 11.6   RADIOLOGY I independently reviewed and interpreted CT abdomen pelvis and see no obvious inflammatory or obstructive changes I also reviewed radiologist's formal read.   PROCEDURES:  Critical Care performed: No  Procedures   MEDICATIONS ORDERED IN ED: Medications  hydrocortisone  sodium succinate  (SOLU-CORTEF ) 100 MG injection 50 mg (has no administration in time range)  potassium chloride  SA (KLOR-CON  M) CR tablet 40 mEq (40 mEq Oral Given 09/10/24 0303)  magnesium  sulfate IVPB 2 g 50 mL (0 g Intravenous Stopped 09/10/24 0408)  sodium chloride  0.9 % bolus 1,000 mL (0 mLs Intravenous Stopped 09/10/24 0408)  ondansetron  (ZOFRAN ) injection 4 mg (4 mg Intravenous Given 09/10/24 0303)  iohexol  (OMNIPAQUE ) 300 MG/ML solution 100 mL (100 mLs Intravenous Contrast Given 09/10/24 0316)  morphine  (PF) 4 MG/ML injection 4 mg (4 mg Intravenous Given 09/10/24 0326)  ondansetron  (ZOFRAN ) injection 4 mg (4 mg Intravenous Given 09/10/24 0502)  ketorolac  (TORADOL ) 15 MG/ML injection 15 mg (15  mg Intravenous Given 09/10/24 0502)     IMPRESSION / MDM / ASSESSMENT AND PLAN / ED COURSE  I reviewed the triage vital signs and the nursing notes.                                Patient's presentation is most consistent with acute presentation with potential threat to life or bodily function.  Differential diagnosis includes, but is not limited to, gastroenteritis, colitis, intra-abdominal infection, obstruction, adrenal crisis, urinary tract infection, C. difficile colitis   The patient is on the cardiac monitor to evaluate for evidence of arrhythmia and/or significant heart rate changes.  MDM:    Nausea vomiting diarrhea abdominal cramping pain, refractory to initial antiemetic treatment persistent nausea and abdominal pain.  Although vital signs normal, she has a history of adrenal insufficiency, concern for impending adrenal crisis.  I will give her stress dose to 50 mg Cortef  at this time.  Will admit her for intractable vomiting.  C. difficile and stool collection pending stool sample by patient.  Admission.       FINAL CLINICAL IMPRESSION(S) / ED DIAGNOSES   Final diagnoses:  Generalized abdominal pain  Nausea vomiting and diarrhea  Adrenal insufficiency (HCC)     Rx / DC Orders   ED Discharge Orders     None        Note:  This document was prepared using Dragon voice recognition software and may include unintentional dictation errors.    Cyrena Mylar, MD 09/10/24 [phone]

## 2024-09-10 NOTE — ED Notes (Signed)
 UA collected and sent. Patient unable to produce any stool or bowel movement at this time. Returned to room. Reconnected to monitor. VSS, call light within reach.

## 2024-09-10 NOTE — ED Triage Notes (Signed)
 Pt reports over past few days abd pain n/v/d, pt has hx colitis.

## 2024-09-10 NOTE — Plan of Care (Signed)

## 2024-09-11 ENCOUNTER — Ambulatory Visit: Admitting: Dermatology

## 2024-09-11 DIAGNOSIS — Z7951 Long term (current) use of inhaled steroids: Secondary | ICD-10-CM | POA: Diagnosis not present

## 2024-09-11 DIAGNOSIS — K219 Gastro-esophageal reflux disease without esophagitis: Secondary | ICD-10-CM | POA: Diagnosis present

## 2024-09-11 DIAGNOSIS — M069 Rheumatoid arthritis, unspecified: Secondary | ICD-10-CM | POA: Diagnosis present

## 2024-09-11 DIAGNOSIS — J45909 Unspecified asthma, uncomplicated: Secondary | ICD-10-CM | POA: Diagnosis present

## 2024-09-11 DIAGNOSIS — G894 Chronic pain syndrome: Secondary | ICD-10-CM | POA: Diagnosis present

## 2024-09-11 DIAGNOSIS — Z79631 Long term (current) use of antimetabolite agent: Secondary | ICD-10-CM | POA: Diagnosis not present

## 2024-09-11 DIAGNOSIS — Z888 Allergy status to other drugs, medicaments and biological substances status: Secondary | ICD-10-CM | POA: Diagnosis not present

## 2024-09-11 DIAGNOSIS — Z923 Personal history of irradiation: Secondary | ICD-10-CM | POA: Diagnosis not present

## 2024-09-11 DIAGNOSIS — Z79899 Other long term (current) drug therapy: Secondary | ICD-10-CM | POA: Diagnosis not present

## 2024-09-11 DIAGNOSIS — R1084 Generalized abdominal pain: Secondary | ICD-10-CM | POA: Diagnosis present

## 2024-09-11 DIAGNOSIS — Z8249 Family history of ischemic heart disease and other diseases of the circulatory system: Secondary | ICD-10-CM | POA: Diagnosis not present

## 2024-09-11 DIAGNOSIS — E876 Hypokalemia: Secondary | ICD-10-CM | POA: Diagnosis present

## 2024-09-11 DIAGNOSIS — Z882 Allergy status to sulfonamides status: Secondary | ICD-10-CM | POA: Diagnosis not present

## 2024-09-11 DIAGNOSIS — E2740 Unspecified adrenocortical insufficiency: Secondary | ICD-10-CM | POA: Diagnosis present

## 2024-09-11 DIAGNOSIS — R112 Nausea with vomiting, unspecified: Secondary | ICD-10-CM | POA: Diagnosis present

## 2024-09-11 DIAGNOSIS — Z8616 Personal history of COVID-19: Secondary | ICD-10-CM | POA: Diagnosis not present

## 2024-09-11 DIAGNOSIS — K529 Noninfective gastroenteritis and colitis, unspecified: Secondary | ICD-10-CM | POA: Diagnosis present

## 2024-09-11 DIAGNOSIS — I959 Hypotension, unspecified: Secondary | ICD-10-CM | POA: Diagnosis present

## 2024-09-11 DIAGNOSIS — R197 Diarrhea, unspecified: Secondary | ICD-10-CM | POA: Diagnosis present

## 2024-09-11 DIAGNOSIS — K3184 Gastroparesis: Secondary | ICD-10-CM | POA: Diagnosis present

## 2024-09-11 DIAGNOSIS — Z91048 Other nonmedicinal substance allergy status: Secondary | ICD-10-CM | POA: Diagnosis not present

## 2024-09-11 DIAGNOSIS — E271 Primary adrenocortical insufficiency: Secondary | ICD-10-CM | POA: Diagnosis present

## 2024-09-11 DIAGNOSIS — E785 Hyperlipidemia, unspecified: Secondary | ICD-10-CM | POA: Diagnosis present

## 2024-09-11 LAB — CBC
HCT: 34.3 % — ABNORMAL LOW (ref 36.0–46.0)
Hemoglobin: 11.5 g/dL — ABNORMAL LOW (ref 12.0–15.0)
MCH: 33.7 pg (ref 26.0–34.0)
MCHC: 33.5 g/dL (ref 30.0–36.0)
MCV: 100.6 fL — ABNORMAL HIGH (ref 80.0–100.0)
Platelets: 200 K/uL (ref 150–400)
RBC: 3.41 MIL/uL — ABNORMAL LOW (ref 3.87–5.11)
RDW: 17.3 % — ABNORMAL HIGH (ref 11.5–15.5)
WBC: 6.7 K/uL (ref 4.0–10.5)
nRBC: 0 % (ref 0.0–0.2)

## 2024-09-11 LAB — IRON AND TIBC
Iron: 121 ug/dL (ref 28–170)
Saturation Ratios: 46 % — ABNORMAL HIGH (ref 10.4–31.8)
TIBC: 265 ug/dL (ref 250–450)
UIBC: 144 ug/dL

## 2024-09-11 LAB — BASIC METABOLIC PANEL WITH GFR
Anion gap: 9 (ref 5–15)
BUN: 9 mg/dL (ref 6–20)
CO2: 25 mmol/L (ref 22–32)
Calcium: 8.4 mg/dL — ABNORMAL LOW (ref 8.9–10.3)
Chloride: 107 mmol/L (ref 98–111)
Creatinine, Ser: 0.68 mg/dL (ref 0.44–1.00)
GFR, Estimated: >60 mL/min (ref >60)
Glucose, Bld: 89 mg/dL (ref 70–99)
Potassium: 3.5 mmol/L (ref 3.5–5.1)
Sodium: 141 mmol/L (ref 135–145)

## 2024-09-11 LAB — FERRITIN: Ferritin: 436 ng/mL — ABNORMAL HIGH (ref 11–307)

## 2024-09-11 LAB — MAGNESIUM: Magnesium: 2.5 mg/dL — ABNORMAL HIGH (ref 1.7–2.4)

## 2024-09-11 MED ORDER — SUVOREXANT 10 MG PO TABS
1.0000 | ORAL_TABLET | Freq: Every day | ORAL | Status: DC
Start: 2024-09-11 — End: 2024-09-12

## 2024-09-11 MED ORDER — DICYCLOMINE HCL 20 MG PO TABS
20.0000 mg | ORAL_TABLET | Freq: Three times a day (TID) | ORAL | Status: DC
  Administered 2024-09-11 – 2024-09-12 (×3): 20 mg via ORAL
  Filled 2024-09-11 (×5): qty 1

## 2024-09-11 MED ORDER — TIZANIDINE HCL 2 MG PO TABS
2.0000 mg | ORAL_TABLET | Freq: Three times a day (TID) | ORAL | Status: DC
  Administered 2024-09-11 – 2024-09-14 (×9): 2 mg via ORAL
  Filled 2024-09-11 (×10): qty 1

## 2024-09-11 MED ORDER — SUMATRIPTAN SUCCINATE 50 MG PO TABS
50.0000 mg | ORAL_TABLET | ORAL | Status: DC | PRN
  Administered 2024-09-12 – 2024-09-14 (×4): 50 mg via ORAL
  Filled 2024-09-11 (×5): qty 1

## 2024-09-11 MED ORDER — NYSTATIN 100000 UNIT/ML MT SUSP
5.0000 mL | Freq: Four times a day (QID) | OROMUCOSAL | Status: DC
  Administered 2024-09-11 – 2024-09-14 (×13): 500000 [IU] via ORAL
  Filled 2024-09-11 (×13): qty 5

## 2024-09-11 MED ORDER — SIMETHICONE 80 MG PO CHEW
80.0000 mg | CHEWABLE_TABLET | Freq: Four times a day (QID) | ORAL | Status: DC
  Administered 2024-09-11 – 2024-09-14 (×12): 80 mg via ORAL
  Filled 2024-09-11 (×12): qty 1

## 2024-09-11 MED ORDER — MOMETASONE FURO-FORMOTEROL FUM 100-5 MCG/ACT IN AERO
2.0000 | INHALATION_SPRAY | Freq: Two times a day (BID) | RESPIRATORY_TRACT | Status: DC
  Administered 2024-09-11 – 2024-09-14 (×6): 2 via RESPIRATORY_TRACT
  Filled 2024-09-11: qty 8.8

## 2024-09-11 MED ORDER — RIZATRIPTAN BENZOATE 10 MG PO TBDP
10.0000 mg | ORAL_TABLET | Freq: Every day | ORAL | Status: DC | PRN
Start: 2024-09-11 — End: 2024-09-11

## 2024-09-11 NOTE — Plan of Care (Signed)

## 2024-09-11 NOTE — Progress Notes (Signed)
 PROGRESS NOTE    [name] Horn  FMW:[id number] DOB: June 02, 1976 DOA: 09/10/2024 PCP: [facility]    Brief Narrative:  48 y.o. female with medical history significant for history of DVT in 2012 s/p completion of anticoagulation treatment, adrenal insufficiency, rheumatoid arthritis, chronic colitis, herpes labialis, gastroparesis, inflammatory bowel disease who came into ED complaining of abdominal pain nausea and vomiting and diarrhea for several days.  Patient stated that her abdominal pain is crampy in nature, 8/10 in intensity, diffuse, nonradiating not able to tolerate p.o. intake.  She denies any fever or chills, denies any dysuria, hematuria, hematemesis, melena.  She denies any chest pain, palpitations, shortness of breath.  She denies any recent travel.  But she has taken doxycycline recently.  Patient is stated that she has been taking her medications as prescribed.    Assessment & Plan:   Principal Problem:   Intractable nausea and vomiting Active Problems:   RA (rheumatoid arthritis) (HCC)   HLD (hyperlipidemia)   History of asthma   Acute thromboembolism of deep veins of lower extremity (HCC)   Nausea & vomiting   History of shingles  1.  Nonspecific nausea vomiting abdominal pain. Unclear etiology.  CT abdomen pelvis reassuring.  No signs of sepsis or infection.  Initially with diarrhea now that is improving as well.  Continues to endorse severe abdominal pain associated with bloating. Plan: Cautiously advance diet to full liquids 4 times daily Bentyl  4 times daily simethicone  As needed antiemetics Tentative plan discharge 9/18  2.  Hypokalemia/hypomagnesemia Monitor and replace as necessary   3.  History of rheumatoid arthritis Appears stable.  Continue methotrexate    4.  History of shingles Continue home dose of acyclovir    5.  Chronic pain syndrome Continue gabapentin  and oxycodone  per home doses   6.  GERD Continue Pepcid    7.   Hypotension Chronic.  Continue home midodrine    DVT prophylaxis: SQ Lovenox  Code Status: Full Family Communication: None Disposition Plan: Status is: Inpatient Remains inpatient appropriate because: Intractable abdominal pain.  Inability to tolerate p.o.   Level of care: Med-Surg  Consultants:  None  Procedures:  None  Antimicrobials: None   Subjective: Seen and examined.  Continues to endorse bloating and abdominal distention  Objective: Vitals:   09/10/24 1541 09/10/24 1951 09/11/24 0519 09/11/24 0729  BP: 115/61 (!) 104/56 115/79 109/62  Pulse: 73 (!) 56 64 71  Resp: 20 17 17 18   Temp: 98.1 F (36.7 C) 97.8 F (36.6 C) 97.7 F (36.5 C) 98 F (36.7 C)  TempSrc: Oral Oral Oral Oral  SpO2: 100% 93% 97% 97%  Weight:      Height:        Intake/Output Summary (Last 24 hours) at 09/11/2024 1450 Last data filed at 09/11/2024 1300 Gross per 24 hour  Intake 2678.66 ml  Output --  Net 2678.66 ml   Filed Weights   09/10/24 0057  Weight: 85.3 kg    Examination:  General exam: Appears calm and comfortable  Respiratory system: Clear to auscultation. Respiratory effort normal. Cardiovascular system: 1 S2, RRR, no murmurs, no pedal edema Gastrointestinal system: Soft, mildly distended, mild TTP, normal bowel sounds Central nervous system: Alert and oriented. No focal neurological deficits. Extremities: Symmetric 5 x 5 power. Skin: No rashes, lesions or ulcers Psychiatry: Judgement and insight appear normal. Mood & affect appropriate.     Data Reviewed: I have personally reviewed following labs and imaging studies  CBC: Recent Labs  Lab 09/10/24 0059 09/11/24  0527  WBC 11.6* 6.7  HGB 13.7 11.5*  HCT 39.1 34.3*  MCV 96.8 100.6*  PLT 251 200   Basic Metabolic Panel: Recent Labs  Lab 09/10/24 0059 09/11/24 0527  NA 143 141  K 2.9* 3.5  CL 102 107  CO2 28 25  GLUCOSE 104* 89  BUN 7 9  CREATININE 0.68 0.68  CALCIUM  9.0 8.4*  MG 1.2* 2.5*    GFR: Estimated Creatinine Clearance: 89.8 mL/min (by C-G formula based on SCr of 0.68 mg/dL). Liver Function Tests: Recent Labs  Lab 09/10/24 0059  AST 42*  ALT 40  ALKPHOS 103  BILITOT 1.1  PROT 7.2  ALBUMIN 4.1   Recent Labs  Lab 09/10/24 0059  LIPASE 28   No results for input(s): AMMONIA in the last 168 hours. Coagulation Profile: No results for input(s): INR, PROTIME in the last 168 hours. Cardiac Enzymes: No results for input(s): CKTOTAL, CKMB, CKMBINDEX, TROPONINI in the last 168 hours. BNP (last 3 results) No results for input(s): PROBNP in the last 8760 hours. HbA1C: No results for input(s): HGBA1C in the last 72 hours. CBG: No results for input(s): GLUCAP in the last 168 hours. Lipid Profile: No results for input(s): CHOL, HDL, LDLCALC, TRIG, CHOLHDL, LDLDIRECT in the last 72 hours. Thyroid Function Tests: No results for input(s): TSH, T4TOTAL, FREET4, T3FREE, THYROIDAB in the last 72 hours. Anemia Panel: Recent Labs    09/11/24 0527  FERRITIN 436*  TIBC 265  IRON 121   Sepsis Labs: No results for input(s): PROCALCITON, LATICACIDVEN in the last 168 hours.  No results found for this or any previous visit (from the past 240 hours).       Radiology Studies: CT ABDOMEN PELVIS W CONTRAST Result Date: 09/10/2024 EXAM: CT ABDOMEN AND PELVIS WITH CONTRAST 09/10/2024 03:22:53 AM TECHNIQUE: CT of the abdomen and pelvis was performed with the administration of intravenous contrast. Multiplanar reformatted images are provided for review. Automated exposure control, iterative reconstruction, and/or weight-based adjustment of the mA/kV was utilized to reduce the radiation dose to as low as reasonably achievable. COMPARISON: None available. CLINICAL HISTORY: IBD abd pain nvd. Table formatting from the original note was not included. Images from the original note were not included. Pt reports over past few days abd pain  n/v/d, pt has hx colitis. FINDINGS: LOWER CHEST: No acute abnormality. LIVER: The liver is unremarkable. GALLBLADDER AND BILE DUCTS: Status post cholecystectomy. No biliary ductal dilatation. SPLEEN: No acute abnormality. PANCREAS: No acute abnormality. ADRENAL GLANDS: No acute abnormality. KIDNEYS, URETERS AND BLADDER: No stones in the kidneys or ureters. No hydronephrosis. No perinephric or periureteral stranding. Urinary bladder is unremarkable. GI AND BOWEL: Status post appendectomy. The stomach, small bowel, and large bowel are otherwise unremarkable. PERITONEUM AND RETROPERITONEUM: No ascites. No free air. VASCULATURE: Aorta is normal in caliber. LYMPH NODES: No lymphadenopathy. REPRODUCTIVE ORGANS: Status post hysterectomy, bilateral oophorectomy and pelvic lymph node dissection. No adnexal masses are seen. BONES AND SOFT TISSUES: No acute osseous abnormality. No focal soft tissue abnormality. IMPRESSION: 1. No acute findings in the abdomen or pelvis. Electronically signed by: Dorethia Molt MD 09/10/2024 03:30 AM EDT RP Workstation: [id number]        Scheduled Meds:  busPIRone   10 mg Oral BID   cycloSPORINE   1 drop Both Eyes BID   dicyclomine   20 mg Oral TID AC & HS   enoxaparin  (LOVENOX ) injection  40 mg Subcutaneous Q24H   famotidine   40 mg Oral QHS   folic acid   1  mg Oral Daily   gabapentin   200 mg Oral Q8H   [START ON 09/13/2024] methotrexate   15 mg Oral Weekly   metoCLOPramide   10 mg Oral TID AC & HS   midodrine   10 mg Oral TID WC   mometasone -formoterol   2 puff Inhalation BID   nystatin   5 mL Oral QID   pravastatin   40 mg Oral QHS   sertraline   25 mg Oral Daily   simethicone   80 mg Oral QID   Suvorexant   1 tablet Oral QHS   tiZANidine   2 mg Oral TID   valACYclovir   500 mg Oral BID   Continuous Infusions:  promethazine  (PHENERGAN ) injection (IM or IVPB) Stopped (09/10/24 0910)     LOS: 0 days    Calvin KATHEE Robson, MD [facility]   If 7PM-7AM, please contact  night-coverage  09/11/2024, 2:50 PM

## 2024-09-12 ENCOUNTER — Inpatient Hospital Stay

## 2024-09-12 DIAGNOSIS — R112 Nausea with vomiting, unspecified: Secondary | ICD-10-CM | POA: Diagnosis not present

## 2024-09-12 MED ORDER — HYDROCORTISONE 5 MG PO TABS
5.0000 mg | ORAL_TABLET | Freq: Every day | ORAL | Status: DC
Start: 2024-09-12 — End: 2024-09-14
  Administered 2024-09-12 – 2024-09-13 (×2): 5 mg via ORAL
  Filled 2024-09-12 (×3): qty 1

## 2024-09-12 MED ORDER — ENSURE PLUS HIGH PROTEIN PO LIQD
237.0000 mL | Freq: Two times a day (BID) | ORAL | Status: DC
  Administered 2024-09-13 – 2024-09-14 (×3): 237 mL via ORAL

## 2024-09-12 MED ORDER — METOCLOPRAMIDE HCL 5 MG/ML IJ SOLN
10.0000 mg | Freq: Three times a day (TID) | INTRAMUSCULAR | Status: DC
Start: 2024-09-12 — End: 2024-09-14
  Administered 2024-09-12 – 2024-09-14 (×6): 10 mg via INTRAVENOUS
  Filled 2024-09-12 (×6): qty 2

## 2024-09-12 MED ORDER — HYDROCORTISONE 10 MG PO TABS
10.0000 mg | ORAL_TABLET | Freq: Every morning | ORAL | Status: DC
  Administered 2024-09-12 – 2024-09-14 (×3): 10 mg via ORAL
  Filled 2024-09-12 (×3): qty 1

## 2024-09-12 NOTE — TOC Initial Note (Signed)
 Transition of Care [facility]) - Initial/Assessment Note    Patient Details  Name: [name] Horn MRN: [id number] Date of Birth: [birth year]  [facility] CM/SW Contact:    Dalia GORMAN Fuse, RN Phone Number: 09/12/2024, 11:04 AM  Clinical Narrative:                    No TOC needs, please outreach if needs are identified.      Patient Goals and CMS Choice            Expected Discharge Plan and Services                                              Prior Living Arrangements/Services                       Activities of Daily Living   ADL Screening (condition at time of admission) Independently performs ADLs?: Yes (appropriate for developmental age) Is the patient deaf or have difficulty hearing?: No Does the patient have difficulty seeing, even when wearing glasses/contacts?: No Does the patient have difficulty concentrating, remembering, or making decisions?: No  Permission Sought/Granted                  Emotional Assessment              Admission diagnosis:  Adrenal insufficiency (HCC) [E27.40] Generalized abdominal pain [R10.84] Nausea vomiting and diarrhea [R11.2, R19.7] Intractable nausea and vomiting [R11.2] Patient Active Problem List   Diagnosis Date Noted   Intractable nausea and vomiting 09/10/2024   Aseptic meningitis due to drug 12/13/2023   Benign recurrent aseptic meningitis 12/10/2023   Depression 12/10/2023   Dyslipidemia 12/10/2023   GERD without esophagitis 12/10/2023   SIRS (systemic inflammatory response syndrome) (HCC) 08/25/2023   UTI (urinary tract infection) 08/25/2023   Recurrent headache 06/06/2023   Aseptic meningitis 05/01/2023   Sepsis (HCC) 05/01/2023   Acute bronchitis 11/11/2022   HLD (hyperlipidemia) 11/11/2022   Lymphedema 11/11/2022   Obesity with body mass index (BMI) of 30.0 to 39.9 11/11/2022   RA (rheumatoid arthritis) (HCC) 11/11/2022   Migraine 11/11/2022   Cardiac  arrhythmia_nonsustained V. 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   Chronic rhinitis 04/23/2020   Adverse food reaction 04/23/2020   Eosinophilic esophagitis 04/03/2020   Oral candidiasis 04/02/2020   History of Clostridium difficile infection 04/02/2020   History of shingles 04/02/2020   History of asthma 04/02/2020   Multiple drug allergies 04/02/2020   Environmental allergies 04/02/2020   History of loop recorder 01/19/2020   PVC (premature ventricular contraction) 01/19/2020   Uterine cancer (HCC) 01/19/2020   Acute colitis 08/14/2019   Constipation    Gastroparesis 04/03/2019   Palpitations 04/10/2018  Nausea and vomiting 04/26/2017   Nausea & vomiting 04/25/2017   Chest pain 04/25/2017   Enterocolitis due to Clostridium difficile 01/04/2016   Incisional hernia without obstruction or gangrene 11/18/2015   Personal history of other diseases of the nervous system and sense organs 10/14/2015   Disease of pancreas 01/21/2015   Other specified bacterial agents as the cause of diseases classified elsewhere 01/21/2015   Radiculopathy of cervical region 12/09/2014   Brachial neuritis 12/09/2014   Cervical radicular pain 12/09/2014   Annual physical exam 01/30/2012   Melena 01/30/2012   Pain in ankle  11/24/2010   Anxiety state 10/11/2010   Acute thromboembolism of deep veins of lower extremity (HCC) 10/11/2010   Generalized anxiety disorder 10/11/2010   COSTOCHONDRITIS 03/03/2008   Cellulitis of toe 12/01/2007   MENOPAUSE, PREMATURE 07/20/2007   UTERINE CANCER, HX OF 07/16/2007   Other postprocedural status(V45.89) 07/16/2007   PCP:  [facility]:   [id number] [place],  - [address] AT [facility] [address] [place] [address] Phone: [phone] Fax: [phone]  [place] [facility] [address] [place] [address] Phone: [phone] Fax: [phone]     Social Drivers of Health (SDOH) Social History: SDOH Screenings   Food Insecurity: No Food Insecurity (09/10/2024)  Recent Concern: Food Insecurity - Food Insecurity Present (06/25/2024)   Received from [facility]  Housing: Low Risk  (09/10/2024)  Recent Concern: Housing - High Risk (06/25/2024)   Received from [facility]  Transportation Needs: No Transportation Needs (09/10/2024)  Recent Concern: Transportation Needs - Unmet Transportation Needs (06/25/2024)   Received from [facility]  Utilities: Not At Risk (09/10/2024)  Recent Concern: Utilities - At Risk (06/25/2024)   Received from [facility]  Depression [phone]): Low Risk  (07/06/2021)  Financial Resource Strain: High Risk (06/25/2024)   Received from [facility]  Social Connections: Unknown (09/10/2024)  Tobacco Use: Medium Risk (09/10/2024)   SDOH Interventions:     Readmission Risk Interventions    12/11/2023   11:55 AM 09/11/2023   12:13 PM 11/13/2022   10:26 AM  Readmission Risk Prevention Plan  Transportation Screening Complete Complete Complete  PCP or Specialist Appt within 3-5 Days   Complete  HRI or Home Care Consult   Complete  Social Work  Consult for Recovery Care Planning/Counseling   Complete  Palliative Care Screening   Not Applicable  Medication Review [employer]) Complete Complete Complete  PCP or Specialist appointment within 3-5 days of discharge Complete Complete   HRI or Home Care Consult Complete    SW Recovery Care/Counseling Consult Complete Complete   Palliative Care Screening Not Applicable Not Applicable   Skilled Nursing Facility Not Applicable Not Applicable

## 2024-09-12 NOTE — Plan of Care (Signed)

## 2024-09-12 NOTE — Progress Notes (Signed)
 PROGRESS NOTE    [name] Horn  FMW:[id number] DOB: 06/24/1976 DOA: 09/10/2024 PCP: [facility]    Brief Narrative:  48 y.o. female with medical history significant for history of DVT in 2012 s/p completion of anticoagulation treatment, adrenal insufficiency, rheumatoid arthritis, chronic colitis, herpes labialis, gastroparesis, inflammatory bowel disease who came into ED complaining of abdominal pain nausea and vomiting and diarrhea for several days.  Patient stated that her abdominal pain is crampy in nature, 8/10 in intensity, diffuse, nonradiating not able to tolerate p.o. intake.  She denies any fever or chills, denies any dysuria, hematuria, hematemesis, melena.  She denies any chest pain, palpitations, shortness of breath.  She denies any recent travel.  But she has taken doxycycline recently.  Patient is stated that she has been taking her medications as prescribed.    Assessment & Plan:   Principal Problem:   Intractable nausea and vomiting Active Problems:   RA (rheumatoid arthritis) (HCC)   HLD (hyperlipidemia)   History of asthma   Acute thromboembolism of deep veins of lower extremity (HCC)   Nausea & vomiting   History of shingles  1.  Nonspecific nausea vomiting abdominal pain. Unclear etiology.  CT abdomen pelvis reassuring.  No signs of sepsis or infection.  Initially with diarrhea now that is improving as well.  Continues to endorse severe abdominal pain associated with bloating. Plan: Continue full liquid diet for now.  Discontinue Bentyl .  Continue simethicone .  Add IV Reglan  10 mg 3 times daily.  Check KUB.  2.  Hypokalemia/hypomagnesemia Monitor and replace as necessary   3.  History of rheumatoid arthritis Appears stable.  Continue methotrexate    4.  History of shingles Continue home dose of acyclovir    5.  Chronic pain syndrome Continue gabapentin  and oxycodone  per home doses   6.  GERD Continue Pepcid    7.  Hypotension Chronic.   Continue home midodrine    DVT prophylaxis: SQ Lovenox  Code Status: Full Family Communication: None Disposition Plan: Status is: Inpatient Remains inpatient appropriate because: Intractable abdominal pain.  Inability to tolerate p.o.   Level of care: Med-Surg  Consultants:  None  Procedures:  None  Antimicrobials: None   Subjective: Seen and examined.  Feels her abdominal bloating and distention is worse  Objective: Vitals:   09/11/24 1500 09/11/24 1934 09/12/24 0406 09/12/24 0824  BP: 114/64 120/75 110/73 124/86  Pulse: 63 (!) 53 64 67  Resp: 18   19  Temp: 98.1 F (36.7 C) 97.7 F (36.5 C) 98 F (36.7 C) 99 F (37.2 C)  TempSrc: Oral   Oral  SpO2: 96% 96% 96% 98%  Weight:      Height:        Intake/Output Summary (Last 24 hours) at 09/12/2024 1414 Last data filed at 09/12/2024 1300 Gross per 24 hour  Intake 840 ml  Output --  Net 840 ml   Filed Weights   09/10/24 0057  Weight: 85.3 kg    Examination:  General exam: Appears calm and comfortable  Respiratory system: Clear to auscultation. Respiratory effort normal. Cardiovascular system: 1 S2, RRR, no murmurs, no pedal edema Gastrointestinal system: Soft, mildly distended, positive bowel sounds Central nervous system: Alert and oriented. No focal neurological deficits. Extremities: Symmetric 5 x 5 power. Skin: No rashes, lesions or ulcers Psychiatry: Judgement and insight appear normal. Mood & affect appropriate.     Data Reviewed: I have personally reviewed following labs and imaging studies  CBC: Recent Labs  Lab 09/10/24 0059  09/11/24 0527  WBC 11.6* 6.7  HGB 13.7 11.5*  HCT 39.1 34.3*  MCV 96.8 100.6*  PLT 251 200   Basic Metabolic Panel: Recent Labs  Lab 09/10/24 0059 09/11/24 0527  NA 143 141  K 2.9* 3.5  CL 102 107  CO2 28 25  GLUCOSE 104* 89  BUN 7 9  CREATININE 0.68 0.68  CALCIUM  9.0 8.4*  MG 1.2* 2.5*   GFR: Estimated Creatinine Clearance: 89.8 mL/min (by C-G  formula based on SCr of 0.68 mg/dL). Liver Function Tests: Recent Labs  Lab 09/10/24 0059  AST 42*  ALT 40  ALKPHOS 103  BILITOT 1.1  PROT 7.2  ALBUMIN 4.1   Recent Labs  Lab 09/10/24 0059  LIPASE 28   No results for input(s): AMMONIA in the last 168 hours. Coagulation Profile: No results for input(s): INR, PROTIME in the last 168 hours. Cardiac Enzymes: No results for input(s): CKTOTAL, CKMB, CKMBINDEX, TROPONINI in the last 168 hours. BNP (last 3 results) No results for input(s): PROBNP in the last 8760 hours. HbA1C: No results for input(s): HGBA1C in the last 72 hours. CBG: No results for input(s): GLUCAP in the last 168 hours. Lipid Profile: No results for input(s): CHOL, HDL, LDLCALC, TRIG, CHOLHDL, LDLDIRECT in the last 72 hours. Thyroid Function Tests: No results for input(s): TSH, T4TOTAL, FREET4, T3FREE, THYROIDAB in the last 72 hours. Anemia Panel: Recent Labs    09/11/24 0527  FERRITIN 436*  TIBC 265  IRON 121   Sepsis Labs: No results for input(s): PROCALCITON, LATICACIDVEN in the last 168 hours.  No results found for this or any previous visit (from the past 240 hours).       Radiology Studies: DG Abd 1 View Result Date: 09/12/2024 CLINICAL DATA:  Abdominal pain and distension. EXAM: ABDOMEN - 1 VIEW COMPARISON:  Abdominal radiograph dated 09/11/2023 FINDINGS: With no bowel dilatation or evidence of obstruction. No free air or radiopaque calculi. Right upper quadrant cholecystectomy clips. No acute osseous pathology. IMPRESSION: Nonobstructive bowel gas pattern. Electronically Signed   By: Vanetta Chou M.D.   On: 09/12/2024 14:11        Scheduled Meds:  busPIRone   10 mg Oral BID   cycloSPORINE   1 drop Both Eyes BID   enoxaparin  (LOVENOX ) injection  40 mg Subcutaneous Q24H   famotidine   40 mg Oral QHS   folic acid   1 mg Oral Daily   gabapentin   200 mg Oral Q8H   hydrocortisone   10 mg Oral q  AM   hydrocortisone   5 mg Oral Q1500   [START ON 09/13/2024] methotrexate   15 mg Oral Weekly   metoCLOPramide  (REGLAN ) injection  10 mg Intravenous Q8H   midodrine   10 mg Oral TID WC   mometasone -formoterol   2 puff Inhalation BID   nystatin   5 mL Oral QID   pravastatin   40 mg Oral QHS   sertraline   25 mg Oral Daily   simethicone   80 mg Oral QID   tiZANidine   2 mg Oral TID   valACYclovir   500 mg Oral BID   Continuous Infusions:  promethazine  (PHENERGAN ) injection (IM or IVPB) 12.5 mg (09/12/24 0443)     LOS: 1 day    Calvin KATHEE Robson, MD [facility]   If 7PM-7AM, please contact night-coverage  09/12/2024, 2:14 PM

## 2024-09-12 NOTE — Plan of Care (Signed)
  Problem: Education: Goal: Knowledge of General Education information will improve Description: Including pain rating scale, medication(s)/side effects and non-pharmacologic comfort measures Outcome: Progressing Note: Verbalizes ability to manage health related needs    Problem: Health Behavior/Discharge Planning: Goal: Ability to manage health-related needs will improve Outcome: Progressing   Problem: Clinical Measurements: Goal: Will remain free from infection Outcome: Progressing Note: No signs or symptoms of infection Goal: Respiratory complications will improve Outcome: Progressing Note: Resp even and unlabored. No SOB. On room air   Problem: Activity: Goal: Risk for activity intolerance will decrease Outcome: Progressing Note: Ambulates to bathroom with assist of 1   Problem: Nutrition: Goal: Adequate nutrition will be maintained Outcome: Progressing Note: Remains on liquid diet. Tolerating well   Problem: Coping: Goal: Level of anxiety will decrease Outcome: Progressing Note: No signs of anxiety present   Problem: Elimination: Goal: Will not experience complications related to urinary retention Outcome: Progressing Note: No signs of urinary retention   Problem: Pain Managment: Goal: General experience of comfort will improve and/or be controlled Outcome: Progressing Note: Pain regimen in place   Problem: Skin Integrity: Goal: Risk for impaired skin integrity will decrease Outcome: Progressing Note: Skin intact

## 2024-09-13 DIAGNOSIS — R112 Nausea with vomiting, unspecified: Secondary | ICD-10-CM | POA: Diagnosis not present

## 2024-09-13 MED ORDER — GABAPENTIN 100 MG PO CAPS
200.0000 mg | ORAL_CAPSULE | Freq: Three times a day (TID) | ORAL | Status: DC
  Administered 2024-09-13 – 2024-09-14 (×3): 200 mg via ORAL
  Filled 2024-09-13 (×3): qty 2

## 2024-09-13 NOTE — TOC Progression Note (Signed)
 Transition of Care [facility]) - Progression Note    Patient Details  Name: [name] Horn MRN: [id number] Date of Birth: August 21, 1976  Transition of Care [facility]) CM/SW Contact  Alfonso Rummer, LCSW Phone Number: 09/13/2024, 11:35 AM  Clinical Narrative:     KEN DELENA Rummer completed re admit screening with pt. Ms .[name] reports she lives with in [place], [place]. PCP is Dr. Dia with [facility] in [place], [place]. Ms. [name] does not drive and relies on spouse and family for transport. [facility] on [address] in [place] is pt preferred pharmacy. Ms. [name] reports no concerns with affording medication. Pt reports having a walker, rollator and nebulizer at home. Pt reports family is supportive. LCSW A Katalyn Matin did not identify TOC needs at this time.                     Expected Discharge Plan and Services                                               Social Drivers of Health (SDOH) Interventions SDOH Screenings   Food Insecurity: No Food Insecurity (09/10/2024)  Recent Concern: Food Insecurity - Food Insecurity Present (06/25/2024)   Received from [facility]  Housing: Low Risk  (09/10/2024)  Recent Concern: Housing - High Risk (06/25/2024)   Received from [facility]  Transportation Needs: No Transportation Needs (09/10/2024)  Recent Concern: Transportation Needs - Unmet Transportation Needs (06/25/2024)   Received from [facility]  Utilities: Not At Risk (09/10/2024)  Recent Concern: Utilities - At Risk (06/25/2024)   Received from [facility]  Depression [phone]): Low Risk  (07/06/2021)  Financial Resource Strain: High Risk (06/25/2024)   Received from [facility]  Social Connections: Unknown (09/10/2024)  Tobacco Use: Medium Risk (09/10/2024)    Readmission Risk Interventions    09/13/2024   11:33 AM 12/11/2023   11:55 AM 09/11/2023   12:13 PM  Readmission Risk  Prevention Plan  Transportation Screening Complete Complete Complete  Medication Review (RN Care Manager) Complete Complete Complete  PCP or Specialist appointment within 3-5 days of discharge Complete Complete Complete  HRI or Home Care Consult Not Complete Complete   HRI or Home Care Consult Pt Refusal Comments No history of home health    SW Recovery Care/Counseling Consult Complete Complete Complete  Palliative Care Screening Not Applicable Not Applicable Not Applicable  Skilled Nursing Facility Not Applicable Not Applicable Not Applicable

## 2024-09-13 NOTE — Progress Notes (Signed)
 PROGRESS NOTE    [name] Horn  FMW:[id number] DOB: [birth year] DOA: 09/10/2024 PCP: [facility]    Brief Narrative:   48 y.o. female with medical history significant for history of DVT in 2012 s/p completion of anticoagulation treatment, adrenal insufficiency, rheumatoid arthritis, chronic colitis, herpes labialis, gastroparesis, inflammatory bowel disease who came into ED complaining of abdominal pain nausea and vomiting and diarrhea for several days.  Patient stated that her abdominal pain is crampy in nature, 8/10 in intensity, diffuse, nonradiating not able to tolerate p.o. intake.  She denies any fever or chills, denies any dysuria, hematuria, hematemesis, melena.  She denies any chest pain, palpitations, shortness of breath.  She denies any recent travel.  But she has taken doxycycline recently.  Patient is stated that she has been taking her medications as prescribed.    Assessment & Plan:   Principal Problem:   Intractable nausea and vomiting Active Problems:   RA (rheumatoid arthritis) (HCC)   HLD (hyperlipidemia)   History of asthma   Acute thromboembolism of deep veins of lower extremity (HCC)   Nausea & vomiting   History of shingles  1.  Nonspecific nausea vomiting abdominal pain. Unclear etiology.  CT abdomen pelvis reassuring.  No signs of sepsis or infection.  Initially with diarrhea now that is improving as well.  Continues to endorse severe abdominal pain associated with bloating. Plan: Advance diet to soft.  Continue simethicone .  Continue IV Reglan .  Patient clinically improving.  No need for stool studies.  Anticipate discharge 9/20.  2.  Hypokalemia/hypomagnesemia Monitor and replace as necessary   3.  History of rheumatoid arthritis Appears stable.  Continue methotrexate    4.  History of shingles Continue home dose of acyclovir    5.  Chronic pain syndrome Continue gabapentin  and oxycodone  per home doses Avoid IV narcotics   6.   GERD Continue Pepcid    7.  Hypotension Chronic.  Continue home midodrine    DVT prophylaxis: SQ Lovenox  Code Status: Full Family Communication: None Disposition Plan: Status is: Inpatient Remains inpatient appropriate because: Intractable abdominal pain.  Inability to tolerate p.o. slowly improving.  Anticipate discharge 9/20.   Level of care: Med-Surg  Consultants:  None  Procedures:  None  Antimicrobials: None   Subjective: Examined.  Bloating and abdominal distention slowly improving.  Patient had a bowel movement.  Objective: Vitals:   09/12/24 2043 09/12/24 2052 09/13/24 0544 09/13/24 0803  BP: 106/82 (!) 91/58 109/75 100/70  Pulse: (!) 51 78 (!) 52 (!) 55  Resp: 15 18 15 15   Temp: 98.2 F (36.8 C) 98.1 F (36.7 C) 98 F (36.7 C) 98.2 F (36.8 C)  TempSrc:  Oral Oral Oral  SpO2: 97% 94% 98% 98%  Weight:      Height:        Intake/Output Summary (Last 24 hours) at 09/13/2024 1247 Last data filed at 09/13/2024 0900 Gross per 24 hour  Intake 840 ml  Output --  Net 840 ml   Filed Weights   09/10/24 0057  Weight: 85.3 kg    Examination:  General exam: NAD Respiratory system: Clear to auscultation. Respiratory effort normal. Cardiovascular system: 1 S2, RRR, no murmurs, no pedal edema Gastrointestinal system: Soft, mild distention, positive bowel sounds Central nervous system: Alert and oriented. No focal neurological deficits. Extremities: Symmetric 5 x 5 power. Skin: No rashes, lesions or ulcers Psychiatry: Judgement and insight appear normal. Mood & affect appropriate.     Data Reviewed: I have personally reviewed following  labs and imaging studies  CBC: Recent Labs  Lab 09/10/24 0059 09/11/24 0527  WBC 11.6* 6.7  HGB 13.7 11.5*  HCT 39.1 34.3*  MCV 96.8 100.6*  PLT 251 200   Basic Metabolic Panel: Recent Labs  Lab 09/10/24 0059 09/11/24 0527  NA 143 141  K 2.9* 3.5  CL 102 107  CO2 28 25  GLUCOSE 104* 89  BUN 7 9   CREATININE 0.68 0.68  CALCIUM  9.0 8.4*  MG 1.2* 2.5*   GFR: Estimated Creatinine Clearance: 89.8 mL/min (by C-G formula based on SCr of 0.68 mg/dL). Liver Function Tests: Recent Labs  Lab 09/10/24 0059  AST 42*  ALT 40  ALKPHOS 103  BILITOT 1.1  PROT 7.2  ALBUMIN 4.1   Recent Labs  Lab 09/10/24 0059  LIPASE 28   No results for input(s): AMMONIA in the last 168 hours. Coagulation Profile: No results for input(s): INR, PROTIME in the last 168 hours. Cardiac Enzymes: No results for input(s): CKTOTAL, CKMB, CKMBINDEX, TROPONINI in the last 168 hours. BNP (last 3 results) No results for input(s): PROBNP in the last 8760 hours. HbA1C: No results for input(s): HGBA1C in the last 72 hours. CBG: No results for input(s): GLUCAP in the last 168 hours. Lipid Profile: No results for input(s): CHOL, HDL, LDLCALC, TRIG, CHOLHDL, LDLDIRECT in the last 72 hours. Thyroid Function Tests: No results for input(s): TSH, T4TOTAL, FREET4, T3FREE, THYROIDAB in the last 72 hours. Anemia Panel: Recent Labs    09/11/24 0527  FERRITIN 436*  TIBC 265  IRON 121   Sepsis Labs: No results for input(s): PROCALCITON, LATICACIDVEN in the last 168 hours.  No results found for this or any previous visit (from the past 240 hours).       Radiology Studies: DG Abd 1 View Result Date: 09/12/2024 CLINICAL DATA:  Abdominal pain and distension. EXAM: ABDOMEN - 1 VIEW COMPARISON:  Abdominal radiograph dated 09/11/2023 FINDINGS: With no bowel dilatation or evidence of obstruction. No free air or radiopaque calculi. Right upper quadrant cholecystectomy clips. No acute osseous pathology. IMPRESSION: Nonobstructive bowel gas pattern. Electronically Signed   By: Vanetta Chou M.D.   On: 09/12/2024 14:11        Scheduled Meds:  busPIRone   10 mg Oral BID   cycloSPORINE   1 drop Both Eyes BID   enoxaparin  (LOVENOX ) injection  40 mg Subcutaneous Q24H    famotidine   40 mg Oral QHS   feeding supplement  237 mL Oral BID BM   folic acid   1 mg Oral Daily   gabapentin   200 mg Oral Q8H   hydrocortisone   10 mg Oral q AM   hydrocortisone   5 mg Oral Q1500   methotrexate   15 mg Oral Weekly   metoCLOPramide  (REGLAN ) injection  10 mg Intravenous Q8H   midodrine   10 mg Oral TID WC   mometasone -formoterol   2 puff Inhalation BID   nystatin   5 mL Oral QID   pravastatin   40 mg Oral QHS   sertraline   25 mg Oral Daily   simethicone   80 mg Oral QID   tiZANidine   2 mg Oral TID   valACYclovir   500 mg Oral BID   Continuous Infusions:  promethazine  (PHENERGAN ) injection (IM or IVPB) 12.5 mg (09/12/24 0443)     LOS: 2 days    Calvin KATHEE Robson, MD [facility]   If 7PM-7AM, please contact night-coverage  09/13/2024, 12:47 PM

## 2024-09-13 NOTE — Plan of Care (Signed)

## 2024-09-14 DIAGNOSIS — R112 Nausea with vomiting, unspecified: Secondary | ICD-10-CM | POA: Diagnosis not present

## 2024-09-14 MED ORDER — SIMETHICONE 80 MG PO CHEW: 80.0000 mg | CHEWABLE_TABLET | Freq: Four times a day (QID) | ORAL | 0 refills | Status: AC

## 2024-09-14 NOTE — Discharge Summary (Signed)
 Physician Discharge Summary  [name] Horn FMW:[id number] DOB: 07/02/1976 DOA: 09/10/2024  PCP: [facility]  Admit date: 09/10/2024 Discharge date: 09/14/2024  Admitted From: Home Disposition:  Home  Recommendations for Outpatient Follow-up:  Follow up with PCP in 1-2 weeks   Home Health:No  Equipment/Devices:None   Discharge Condition:Stable  CODE STATUS:FULL  Diet recommendation: Reg  Brief/Interim Summary:  48 y.o. female with medical history significant for history of DVT in 2012 s/p completion of anticoagulation treatment, adrenal insufficiency, rheumatoid arthritis, chronic colitis, herpes labialis, gastroparesis, inflammatory bowel disease who came into ED complaining of abdominal pain nausea and vomiting and diarrhea for several days.  Patient stated that her abdominal pain is crampy in nature, 8/10 in intensity, diffuse, nonradiating not able to tolerate p.o. intake.  She denies any fever or chills, denies any dysuria, hematuria, hematemesis, melena.  She denies any chest pain, palpitations, shortness of breath.  She denies any recent travel.  But she has taken doxycycline recently.  Patient is stated that she has been taking her medications as prescribed.       Discharge Diagnoses:  Principal Problem:   Intractable nausea and vomiting Active Problems:   RA (rheumatoid arthritis) (HCC)   HLD (hyperlipidemia)   History of asthma   Acute thromboembolism of deep veins of lower extremity (HCC)   Nausea & vomiting   History of shingles  1.  Nonspecific nausea vomiting abdominal pain. Unclear etiology.  CT abdomen pelvis reassuring.  No signs of sepsis or infection.  Initially with diarrhea now that is improving as well.  Continues to endorse severe abdominal pain associated with bloating.  Gradually improved Plan: Patient back at baseline level function at time of discharge.  Husband at bedside at time of discharge.  Recommend soft/bland diet for period of 1  to 2 weeks post discharge.  Continue simethicone .  Can resume p.o. Reglan  10 mg 3 times daily on discharge.  Patient takes alternating Zofran  and Phenergan  for breakthrough nausea.  Okay to resume these.  No need for stool studies.    Discharge Instructions  Discharge Instructions     Diet - low sodium heart healthy   Complete by: As directed    Increase activity slowly   Complete by: As directed       Allergies as of 09/14/2024       Reactions   Cyanoacrylate Hives   Other Itching, Rash   Dermabond surgical glue. Dermabond surgical glue-blisters   Sulfa Antibiotics Anaphylaxis, Rash, Shortness Of Breath, Swelling   Angioedema (also)   Sulfonamide Derivatives Hives, Shortness Of Breath, Swelling   TONGUE SWELLS   Benadryl  [diphenhydramine  Hcl] Other (See Comments)   Hyperactivity   Sulfamethoxazole    Other Reaction(s): Angioedema   Diphenhydramine     Diphenhydramine  Hcl    Other Reaction(s): Other (See Comments) HYPERACTIVITY, RESTLESS LEG   Silicone Rash   Watch band        Medication List     TAKE these medications    albuterol  (2.5 MG/3ML) 0.083% nebulizer solution Commonly known as: PROVENTIL  Take 3 mLs (2.5 mg total) by nebulization every 4 (four) hours as needed for wheezing or shortness of breath.   albuterol  108 (90 Base) MCG/ACT inhaler Commonly known as: VENTOLIN  HFA Inhale 1 puff into the lungs every 4 (four) hours as needed.   amphetamine -dextroamphetamine  20 MG tablet Commonly known as: ADDERALL Take 20 mg by mouth 3 (three) times daily.   atropine  1 % ophthalmic solution Place 1 drop into both  eyes 3 (three) times daily as needed.   Belsomra  10 MG Tabs Generic drug: Suvorexant  Take 1 tablet by mouth at bedtime.   budesonide -formoterol  80-4.5 MCG/ACT inhaler Commonly known as: Symbicort  Inhale 2 puffs into the lungs 2 (two) times daily. Can also take Q4 hr as needed for wheezing   busPIRone  10 MG tablet Commonly known as: BUSPAR  Take  10 mg by mouth 2 (two) times daily.   Cholecalciferol 1.25 MG (50000 UT) capsule Take 50,000 Units by mouth once a week.   cycloSPORINE  0.05 % ophthalmic emulsion Commonly known as: RESTASIS  Place 1 drop into both eyes 2 (two) times daily.   dicyclomine  10 MG capsule Commonly known as: BENTYL  Take 10 mg by mouth daily as needed for nausea/vomiting.   famotidine  40 MG tablet Commonly known as: PEPCID  Take 40 mg by mouth at bedtime.   folic acid  1 MG tablet Commonly known as: FOLVITE  Take 2 tablets (2 mg total) by mouth daily. What changed: how much to take   furosemide  20 MG tablet Commonly known as: LASIX  Take 60 mg by mouth 2 (two) times daily as needed for fluid or edema.   gabapentin  250 MG/5ML solution Commonly known as: NEURONTIN  Take by mouth 3 (three) times daily.   hydrOXYzine  25 MG tablet Commonly known as: ATARAX  Take 1 tablet (25 mg total) by mouth 2 (two) times daily. What changed:  when to take this reasons to take this   lactulose  10 GM/15ML solution Commonly known as: CHRONULAC  Take 30 mLs (20 g total) by mouth daily as needed for mild constipation.   methotrexate  5 MG tablet Commonly known as: RHEUMATREX Take 5 mg by mouth once a week. Caution: Chemotherapy. Protect from light. What changed: how much to take   midodrine  10 MG tablet Commonly known as: PROAMATINE  Take 1 tablet (10 mg total) by mouth 3 (three) times daily with meals.   mupirocin  ointment 2 % Commonly known as: BACTROBAN  Apply 1 Application topically 2 (two) times daily.   naloxone  4 MG/0.1ML Liqd nasal spray kit Commonly known as: NARCAN  Place 1 spray into the nose once.   nystatin  100000 UNIT/ML suspension Commonly known as: MYCOSTATIN  Take 10 mLs (1,000,000 Units total) by mouth 3 (three) times daily. What changed:  when to take this reasons to take this   nystatin  powder Apply 1 application topically 2 (two) times daily as needed (rash).   ondansetron  8 MG  disintegrating tablet Commonly known as: ZOFRAN -ODT Take 1 tablet (8 mg total) by mouth every 8 (eight) hours as needed.   oxyCODONE  15 MG immediate release tablet Commonly known as: ROXICODONE  Take 15 mg by mouth every 4 (four) hours as needed.   oxyCODONE -acetaminophen  10-325 MG tablet Commonly known as: PERCOCET Take 1 tablet by mouth 5 (five) times daily as needed.   pantoprazole  40 MG tablet Commonly known as: Protonix  Take 1 tablet (40 mg total) by mouth daily. What changed: when to take this   Polyethyl Glycol-Propyl Glycol 0.4-0.3 % Gel ophthalmic gel Commonly known as: SYSTANE Place 1 application  into both eyes 2 (two) times daily as needed (Eye dryness).   Systane 0.4-0.3 % Soln Generic drug: Polyethyl Glycol-Propyl Glycol Apply 1 drop to eye in the morning, at noon, and at bedtime.   polyethylene glycol 17 g packet Commonly known as: MIRALAX  / GLYCOLAX  Take 17 g by mouth daily. What changed:  when to take this reasons to take this   Potassium Chloride  ER 20 MEQ Tbcr Take 20 mEq by  mouth daily as needed (when taking furosemide ).   pravastatin  40 MG tablet Commonly known as: PRAVACHOL  Take 40 mg by mouth at bedtime.   prednisoLONE  acetate 1 % ophthalmic suspension Commonly known as: PRED FORTE  Place 1 drop into the left eye as needed (conjunctivitis).   Privigen  40 GM/400ML Soln Generic drug: Immune Globulin  10% Inject 40 g into the vein every 28 (twenty-eight) days.   prochlorperazine  10 MG tablet Commonly known as: COMPAZINE  Take 1 tablet (10 mg total) by mouth every 6 (six) hours as needed for nausea or vomiting.   promethazine  25 MG tablet Commonly known as: PHENERGAN  Take 1 tablet (25 mg total) by mouth every 6 (six) hours as needed for nausea or vomiting.   psyllium 95 % Pack Commonly known as: HYDROCIL/METAMUCIL Take 1 packet by mouth daily. What changed:  when to take this reasons to take this   rizatriptan  10 MG disintegrating  tablet Commonly known as: MAXALT -MLT Take 10 mg by mouth daily as needed for migraine.   sertraline  25 MG tablet Commonly known as: ZOLOFT  Take 25 mg by mouth daily.   simethicone  80 MG chewable tablet Commonly known as: MYLICON Chew 1 tablet (80 mg total) by mouth 4 (four) times daily.   SUMAtriptan  25 MG tablet Commonly known as: IMITREX  Take 25 mg by mouth every 2 (two) hours as needed.   tiZANidine  2 MG tablet Commonly known as: ZANAFLEX  Take 2 mg by mouth 3 (three) times daily.   topiramate  25 MG tablet Commonly known as: TOPAMAX  Take 25 mg by mouth at bedtime.   topiramate  100 MG tablet Commonly known as: TOPAMAX  Take 100 mg by mouth daily.   triamcinolone  lotion 0.1 % Commonly known as: KENALOG  Apply 1 application topically daily as needed (rash).   valACYclovir  1000 MG tablet Commonly known as: VALTREX  Take 1 tablet (1,000 mg total) by mouth daily. What changed:  how much to take when to take this        Follow-up Information     [facility] Follow up.   Why: hospital follow up Contact information: [address] [facility] [place] [address] [phone]                Allergies  Allergen Reactions   Cyanoacrylate Hives   Other Itching and Rash    Dermabond surgical glue.  Dermabond surgical glue-blisters   Sulfa Antibiotics Anaphylaxis, Rash, Shortness Of Breath and Swelling    Angioedema (also)   Sulfonamide Derivatives Hives, Shortness Of Breath and Swelling    TONGUE SWELLS   Benadryl  [Diphenhydramine  Hcl] Other (See Comments)    Hyperactivity    Sulfamethoxazole     Other Reaction(s): Angioedema   Diphenhydramine     Diphenhydramine  Hcl     Other Reaction(s): Other (See Comments)  HYPERACTIVITY, RESTLESS LEG   Silicone Rash    Watch band    Consultations: None   Procedures/Studies: DG Abd 1 View Result Date: 09/12/2024 CLINICAL DATA:  Abdominal pain and distension. EXAM: ABDOMEN - 1 VIEW COMPARISON:   Abdominal radiograph dated 09/11/2023 FINDINGS: With no bowel dilatation or evidence of obstruction. No free air or radiopaque calculi. Right upper quadrant cholecystectomy clips. No acute osseous pathology. IMPRESSION: Nonobstructive bowel gas pattern. Electronically Signed   By: Vanetta Chou M.D.   On: 09/12/2024 14:11   CT ABDOMEN PELVIS W CONTRAST Result Date: 09/10/2024 EXAM: CT ABDOMEN AND PELVIS WITH CONTRAST 09/10/2024 03:22:53 AM TECHNIQUE: CT of the abdomen and pelvis was performed with the administration of  intravenous contrast. Multiplanar reformatted images are provided for review. Automated exposure control, iterative reconstruction, and/or weight-based adjustment of the mA/kV was utilized to reduce the radiation dose to as low as reasonably achievable. COMPARISON: None available. CLINICAL HISTORY: IBD abd pain nvd. Table formatting from the original note was not included. Images from the original note were not included. Pt reports over past few days abd pain n/v/d, pt has hx colitis. FINDINGS: LOWER CHEST: No acute abnormality. LIVER: The liver is unremarkable. GALLBLADDER AND BILE DUCTS: Status post cholecystectomy. No biliary ductal dilatation. SPLEEN: No acute abnormality. PANCREAS: No acute abnormality. ADRENAL GLANDS: No acute abnormality. KIDNEYS, URETERS AND BLADDER: No stones in the kidneys or ureters. No hydronephrosis. No perinephric or periureteral stranding. Urinary bladder is unremarkable. GI AND BOWEL: Status post appendectomy. The stomach, small bowel, and large bowel are otherwise unremarkable. PERITONEUM AND RETROPERITONEUM: No ascites. No free air. VASCULATURE: Aorta is normal in caliber. LYMPH NODES: No lymphadenopathy. REPRODUCTIVE ORGANS: Status post hysterectomy, bilateral oophorectomy and pelvic lymph node dissection. No adnexal masses are seen. BONES AND SOFT TISSUES: No acute osseous abnormality. No focal soft tissue abnormality. IMPRESSION: 1. No acute findings in the  abdomen or pelvis. Electronically signed by: Dorethia Molt MD 09/10/2024 03:30 AM EDT RP Workstation: [id number]      Subjective: Seen and examined the day of discharge.  Stable no distress.  Appropriate for discharge home.  Discharge Exam: Vitals:   09/14/24 0539 09/14/24 0730  BP: (!) 101/57 112/71  Pulse: (!) 58 62  Resp: 18 16  Temp: 98.4 F (36.9 C) 98 F (36.7 C)  SpO2: 99% 98%   Vitals:   09/13/24 1638 09/13/24 2011 09/14/24 0539 09/14/24 0730  BP: 129/79 (!) 142/73 (!) 101/57 112/71  Pulse: (!) 57  (!) 58 62  Resp: 19 18 18 16   Temp: 98.9 F (37.2 C) 98.4 F (36.9 C) 98.4 F (36.9 C) 98 F (36.7 C)  TempSrc: Oral   Oral  SpO2: 100% 95% 99% 98%  Weight:      Height:        General: Pt is alert, awake, not in acute distress Cardiovascular: RRR, S1/S2 +, no rubs, no gallops Respiratory: CTA bilaterally, no wheezing, no rhonchi Abdominal: Soft, NT, ND, bowel sounds + Extremities: no edema, no cyanosis    The results of significant diagnostics from this hospitalization (including imaging, microbiology, ancillary and laboratory) are listed below for reference.     Microbiology: No results found for this or any previous visit (from the past 240 hours).   Labs: BNP (last 3 results) No results for input(s): BNP in the last 8760 hours. Basic Metabolic Panel: Recent Labs  Lab 09/10/24 0059 09/11/24 0527  NA 143 141  K 2.9* 3.5  CL 102 107  CO2 28 25  GLUCOSE 104* 89  BUN 7 9  CREATININE 0.68 0.68  CALCIUM  9.0 8.4*  MG 1.2* 2.5*   Liver Function Tests: Recent Labs  Lab 09/10/24 0059  AST 42*  ALT 40  ALKPHOS 103  BILITOT 1.1  PROT 7.2  ALBUMIN 4.1   Recent Labs  Lab 09/10/24 0059  LIPASE 28   No results for input(s): AMMONIA in the last 168 hours. CBC: Recent Labs  Lab 09/10/24 0059 09/11/24 0527  WBC 11.6* 6.7  HGB 13.7 11.5*  HCT 39.1 34.3*  MCV 96.8 100.6*  PLT 251 200   Cardiac Enzymes: No results for input(s):  CKTOTAL, CKMB, CKMBINDEX, TROPONINI in the last 168 hours. BNP: Invalid input(s):  POCBNP CBG: No results for input(s): GLUCAP in the last 168 hours. D-Dimer No results for input(s): DDIMER in the last 72 hours. Hgb A1c No results for input(s): HGBA1C in the last 72 hours. Lipid Profile No results for input(s): CHOL, HDL, LDLCALC, TRIG, CHOLHDL, LDLDIRECT in the last 72 hours. Thyroid function studies No results for input(s): TSH, T4TOTAL, T3FREE, THYROIDAB in the last 72 hours.  Invalid input(s): FREET3 Anemia work up No results for input(s): VITAMINB12, FOLATE, FERRITIN, TIBC, IRON, RETICCTPCT in the last 72 hours. Urinalysis    Component Value Date/Time   COLORURINE COLORLESS (A) 09/10/2024 0409   APPEARANCEUR CLEAR (A) 09/10/2024 0409   LABSPEC 1.026 09/10/2024 0409   PHURINE 7.0 09/10/2024 0409   GLUCOSEU NEGATIVE 09/10/2024 0409   HGBUR NEGATIVE 09/10/2024 0409   HGBUR negative 07/25/2007 1131   BILIRUBINUR NEGATIVE 09/10/2024 0409   KETONESUR NEGATIVE 09/10/2024 0409   PROTEINUR NEGATIVE 09/10/2024 0409   UROBILINOGEN 1.0 05/27/2009 1500   NITRITE NEGATIVE 09/10/2024 0409   LEUKOCYTESUR NEGATIVE 09/10/2024 0409   Sepsis Labs Recent Labs  Lab 09/10/24 0059 09/11/24 0527  WBC 11.6* 6.7   Microbiology No results found for this or any previous visit (from the past 240 hours).   Time coordinating discharge: 40 minutes  SIGNED:   Calvin KATHEE Robson, MD  [facility] 09/14/2024, 1:38 PM Pager   If 7PM-7AM, please contact night-coverage

## 2024-09-17 ENCOUNTER — Other Ambulatory Visit: Payer: Self-pay

## 2024-09-17 ENCOUNTER — Emergency Department
Admission: EM | Admit: 2024-09-17 | Discharge: 2024-09-17 | Disposition: A | Discharge status: Home / Self Care | Attending: Emergency Medicine | Admitting: Emergency Medicine

## 2024-09-17 DIAGNOSIS — R112 Nausea with vomiting, unspecified: Secondary | ICD-10-CM | POA: Insufficient documentation

## 2024-09-17 DIAGNOSIS — D72829 Elevated white blood cell count, unspecified: Secondary | ICD-10-CM | POA: Diagnosis not present

## 2024-09-17 DIAGNOSIS — R1084 Generalized abdominal pain: Secondary | ICD-10-CM | POA: Diagnosis present

## 2024-09-17 LAB — CBC
HCT: 47.2 % — ABNORMAL HIGH (ref 36.0–46.0)
Hemoglobin: 16.4 g/dL — ABNORMAL HIGH (ref 12.0–15.0)
MCH: 34.5 pg — ABNORMAL HIGH (ref 26.0–34.0)
MCHC: 34.7 g/dL (ref 30.0–36.0)
MCV: 99.2 fL (ref 80.0–100.0)
Platelets: 358 K/uL (ref 150–400)
RBC: 4.76 MIL/uL (ref 3.87–5.11)
RDW: 16.7 % — ABNORMAL HIGH (ref 11.5–15.5)
WBC: 13.2 K/uL — ABNORMAL HIGH (ref 4.0–10.5)
nRBC: 0 % (ref 0.0–0.2)

## 2024-09-17 LAB — COMPREHENSIVE METABOLIC PANEL WITH GFR
ALT: 74 U/L — ABNORMAL HIGH (ref 0–44)
AST: 49 U/L — ABNORMAL HIGH (ref 15–41)
Albumin: 4.5 g/dL (ref 3.5–5.0)
Alkaline Phosphatase: 111 U/L (ref 38–126)
Anion gap: 15 (ref 5–15)
BUN: 9 mg/dL (ref 6–20)
CO2: 19 mmol/L — ABNORMAL LOW (ref 22–32)
Calcium: 10.6 mg/dL — ABNORMAL HIGH (ref 8.9–10.3)
Chloride: 106 mmol/L (ref 98–111)
Creatinine, Ser: 0.87 mg/dL (ref 0.44–1.00)
GFR, Estimated: >60 mL/min (ref >60)
Glucose, Bld: 134 mg/dL — ABNORMAL HIGH (ref 70–99)
Potassium: 4.1 mmol/L (ref 3.5–5.1)
Sodium: 140 mmol/L (ref 135–145)
Total Bilirubin: 1 mg/dL (ref 0.0–1.2)
Total Protein: 8.2 g/dL — ABNORMAL HIGH (ref 6.5–8.1)

## 2024-09-17 LAB — URINALYSIS, ROUTINE W REFLEX MICROSCOPIC
Bacteria, UA: NONE SEEN
Bilirubin Urine: NEGATIVE
Glucose, UA: NEGATIVE mg/dL
Hgb urine dipstick: NEGATIVE
Ketones, ur: NEGATIVE mg/dL
Nitrite: NEGATIVE
Protein, ur: NEGATIVE mg/dL
Specific Gravity, Urine: 1.002 — ABNORMAL LOW (ref 1.005–1.030)
pH: 7 (ref 5.0–8.0)

## 2024-09-17 LAB — CK: Total CK: 75 U/L (ref 38–234)

## 2024-09-17 LAB — MAGNESIUM: Magnesium: 2.1 mg/dL (ref 1.7–2.4)

## 2024-09-17 LAB — LIPASE, BLOOD: Lipase: 23 U/L (ref 11–51)

## 2024-09-17 MED ORDER — LACTATED RINGERS IV BOLUS
1000.0000 mL | Freq: Once | INTRAVENOUS | Status: AC
  Administered 2024-09-17: 1000 mL via INTRAVENOUS

## 2024-09-17 MED ORDER — OXYCODONE HCL 5 MG PO TABS
10.0000 mg | ORAL_TABLET | Freq: Once | ORAL | Status: AC
  Administered 2024-09-17: 10 mg via ORAL
  Filled 2024-09-17: qty 2

## 2024-09-17 MED ORDER — ONDANSETRON HCL 4 MG/2ML IJ SOLN
4.0000 mg | Freq: Once | INTRAMUSCULAR | Status: AC
  Administered 2024-09-17: 4 mg via INTRAVENOUS
  Filled 2024-09-17: qty 2

## 2024-09-17 MED ORDER — OXYCODONE HCL 5 MG PO TABS: 15.0000 mg | ORAL_TABLET | Freq: Once | ORAL | Status: DC

## 2024-09-17 MED ORDER — MORPHINE SULFATE (PF) 4 MG/ML IV SOLN
4.0000 mg | Freq: Once | INTRAVENOUS | Status: AC
  Administered 2024-09-17: 4 mg via INTRAVENOUS
  Filled 2024-09-17: qty 1

## 2024-09-17 NOTE — ED Provider Notes (Signed)
 [facility] Provider Note    Event Date/Time   First MD Initiated Contact with Patient 09/17/24 [phone]     (approximate)   History   Chief Complaint Abdominal Pain   HPI  [name] Horn is a 48 y.o. female with past medical history of rheumatoid arthritis, adrenal insufficiency, colitis, inflammatory bowel disease, and gastroparesis who presents to the ED complaining of abdominal pain.  Patient reports that she was recently admitted to the hospital for abdominal issues with vomiting and diarrhea, subsequently discharged 3 days ago after improvement in her symptoms.  Since then, she states that she has had increasing crampy pain in her abdomen similar to before with nausea and vomiting.  She states she has been unable to keep anything down during this time, although diarrhea has improved.  She has not noticed any blood in her emesis or stool, denies any fevers or flank pain, but does report urinary frequency.  She also reports severe cramping in her legs, is concerned that there is an issue with her electrolytes similar to before.     Physical Exam   Triage Vital Signs: ED Triage Vitals  Encounter Vitals Group     BP 09/17/24 0326 (!) 131/104     Girls Systolic BP Percentile --      Girls Diastolic BP Percentile --      Boys Systolic BP Percentile --      Boys Diastolic BP Percentile --      Pulse Rate 09/17/24 0326 (!) 120     Resp 09/17/24 0326 18     Temp 09/17/24 0326 98.5 F (36.9 C)     Temp src --      SpO2 09/17/24 0326 98 %     Weight 09/17/24 0325 185 lb (83.9 kg)     Height 09/17/24 0325 5' 4 (1.626 m)     Head Circumference --      Peak Flow --      Pain Score 09/17/24 0325 7     Pain Loc --      Pain Education --      Exclude from Growth Chart --     Most recent vital signs: Vitals:   09/17/24 0530 09/17/24 0600  BP: 103/83 107/84  Pulse: 94 81  Resp:  15  Temp:    SpO2: 100% 100%    Constitutional: Alert and  oriented. Eyes: Conjunctivae are normal. Head: Atraumatic. Nose: No congestion/rhinnorhea. Mouth/Throat: Mucous membranes are moist.  Cardiovascular: Normal rate, regular rhythm. Grossly normal heart sounds.  2+ radial pulses bilaterally. Respiratory: Normal respiratory effort.  No retractions. Lungs CTAB. Gastrointestinal: Soft and diffusely tender to palpation with no rebound or guarding. No distention. Musculoskeletal: No lower extremity tenderness nor edema.  Neurologic:  Normal speech and language. No gross focal neurologic deficits are appreciated.    ED Results / Procedures / Treatments   Labs (all labs ordered are listed, but only abnormal results are displayed) Labs Reviewed  COMPREHENSIVE METABOLIC PANEL WITH GFR - Abnormal; Notable for the following components:      Result Value   CO2 19 (*)    Glucose, Bld 134 (*)    Calcium  10.6 (*)    Total Protein 8.2 (*)    AST 49 (*)    ALT 74 (*)    All other components within normal limits  CBC - Abnormal; Notable for the following components:   WBC 13.2 (*)    Hemoglobin 16.4 (*)  HCT 47.2 (*)    MCH 34.5 (*)    RDW 16.7 (*)    All other components within normal limits  URINALYSIS, ROUTINE W REFLEX MICROSCOPIC - Abnormal; Notable for the following components:   Color, Urine STRAW (*)    APPearance CLEAR (*)    Specific Gravity, Urine 1.002 (*)    Leukocytes,Ua TRACE (*)    All other components within normal limits  URINE CULTURE  MAGNESIUM   CK  LIPASE, BLOOD    PROCEDURES:  Critical Care performed: No  Procedures   MEDICATIONS ORDERED IN ED: Medications  morphine  (PF) 4 MG/ML injection 4 mg (4 mg Intravenous Given 09/17/24 0355)  ondansetron  (ZOFRAN ) injection 4 mg (4 mg Intravenous Given 09/17/24 0350)  lactated ringers  bolus 1,000 mL (1,000 mLs Intravenous New Bag/Given 09/17/24 0357)  oxyCODONE  (Oxy IR/ROXICODONE ) immediate release tablet 10 mg (10 mg Oral Given 09/17/24 0539)     IMPRESSION / MDM /  ASSESSMENT AND PLAN / ED COURSE  I reviewed the triage vital signs and the nursing notes.                              48 y.o. female with past medical history of rheumatoid arthritis, adrenal insufficiency, colitis, inflammatory bowel disease, and gastroparesis who presents to the ED complaining of increasing abdominal pain over the past 2 days with nausea and vomiting.  Patient's presentation is most consistent with acute presentation with potential threat to life or bodily function.  Differential diagnosis includes, but is not limited to, colitis, gastroenteritis, gastroparesis, gastritis, pancreatitis, hepatitis, cholecystitis, biliary colic, UTI, dehydration, electrolyte abnormality.  Patient nontoxic-appearing and in no acute distress, vital signs are unremarkable.  Her abdomen is soft with diffuse tenderness.  I reviewed prior CT imaging of her abdomen from 7 days ago, which was unremarkable and I do not feel repeat imaging indicated at this time.  We will recheck labs including CBC, CMP, lipase, magnesium , and CK level.  Plan to treat symptomatically with IV morphine  and Zofran , hydrate with IV fluids and reassess.  Labs without significant anemia, leukocytosis, electrolyte abnormality, or AKI.  She does have a very mild transaminitis, however results appear similar to earlier in August and bilirubin as well as alkaline phosphatase are unremarkable.  Lipase level is delayed due to shortage of reagent required here at [facility], lipase having to be sent out to outside hospital for results.  Low suspicion for pancreatitis at this time, patient reports feeling much better on reassessment and is tolerating oral intake without difficulty.  Urinalysis with some mild signs of infection, UTI seems less likely but patient does endorse urinary frequency, will send for culture but hold off on antibiotics.  Patient is appropriate for discharge home with outpatient follow-up, reports she has follow-up with her  PCP later this week and has plenty of nausea medication available at home.  She was counseled to return to the ED for new or worsening symptoms, patient agrees with plan.      FINAL CLINICAL IMPRESSION(S) / ED DIAGNOSES   Final diagnoses:  Generalized abdominal pain  Nausea and vomiting, unspecified vomiting type     Rx / DC Orders   ED Discharge Orders     None        Note:  This document was prepared using Dragon voice recognition software and may include unintentional dictation errors.   Willo Dunnings, MD 09/17/24 [phone]

## 2024-09-17 NOTE — ED Triage Notes (Signed)
 Pt reports she was recently admitted here and discharged this past Saturday for stomach problems pt has hx colitis. Pt was noted to have low potassium and magnesium  during her prior admission. Pt reports since she was discharged inc abd pain and bilateral leg cramps. Pt denies sob or cp

## 2024-09-18 LAB — URINE CULTURE: Culture: <10000 — AB

## 2024-10-16 ENCOUNTER — Encounter: Payer: Self-pay | Admitting: Dermatology

## 2024-10-16 ENCOUNTER — Ambulatory Visit: Admitting: Dermatology

## 2024-10-16 DIAGNOSIS — D239 Other benign neoplasm of skin, unspecified: Secondary | ICD-10-CM

## 2024-10-16 DIAGNOSIS — L814 Other melanin hyperpigmentation: Secondary | ICD-10-CM

## 2024-10-16 DIAGNOSIS — D2221 Melanocytic nevi of right ear and external auricular canal: Secondary | ICD-10-CM | POA: Diagnosis not present

## 2024-10-16 NOTE — Progress Notes (Signed)
   Follow-Up Visit   Subjective  [name] Horn is a 48 y.o. female who presents for the following: shave removal of moderate to severe dysplastic nevus bx proven L post mid helix   The following portions of the chart were reviewed this encounter and updated as appropriate: medications, allergies, medical history  Review of Systems:  No other skin or systemic complaints except as noted in HPI or Assessment and Plan.  Objective  Well appearing patient in no apparent distress; mood and affect are within normal limits.   A focused examination was performed of the following areas: Left ear  Relevant exam findings are noted in the Assessment and Plan.  L post mid helix Pink bx site 0.7cm  Assessment & Plan   LENTIGINES Exam: scattered tan macules face, ears Due to sun exposure Treatment Plan: Benign-appearing, observe.  Recommend daily broad spectrum sunscreen SPF 30+ to sun-exposed areas, reapply every 2 hours as needed. Call for new or changing lesions.  Staying in the shade or wearing long sleeves, sun glasses (UVA+UVB protection) and wide brim hats (4-inch brim around the entire circumference of the hat) are also recommended for sun protection.    DYSPLASTIC NEVUS L post mid helix Bx proven moderate to severe dysplastic nevus Epidermal / dermal shaving - L post mid helix  Lesion diameter (cm):  0.7 Informed consent: discussed and consent obtained   Patient was prepped and draped in usual sterile fashion: area prepped with alcohol . Anesthesia: the lesion was anesthetized in a standard fashion   Anesthetic:  1% lidocaine  w/ epinephrine  1-100,000 buffered w/ 8.4% NaHCO3 Instrument used: flexible razor blade   Hemostasis achieved with: pressure, aluminum chloride and electrodesiccation   Outcome: patient tolerated procedure well   Post-procedure details: wound care instructions given   Post-procedure details comment:  Ointment and small bandage applied Additional details:   Post txt defect 0.7cm  Specimen 1 - Surgical pathology Differential Diagnosis: Bx proven moderate to severe dysplastic nevus  Check Margins: yes Pink bx site 0.7cm [phone]  Return in about 6 months (around 04/16/2025) for TBSE, Hx of Dysplastic nevi.  I, Grayce Saunas, RMA, am acting as scribe for Rexene Rattler, MD .   Documentation: I have reviewed the above documentation for accuracy and completeness, and I agree with the above.  Rexene Rattler, MD

## 2024-10-16 NOTE — Patient Instructions (Addendum)

## 2024-10-21 ENCOUNTER — Ambulatory Visit: Payer: Self-pay | Admitting: Dermatology

## 2024-10-21 LAB — SURGICAL PATHOLOGY

## 2024-10-22 ENCOUNTER — Encounter: Payer: Self-pay | Admitting: Dermatology

## 2024-10-22 NOTE — Telephone Encounter (Signed)
Left pt message to call for bx results/sh °

## 2024-10-22 NOTE — Telephone Encounter (Signed)
-----   Message from Rexene Rattler sent at 10/21/2024  5:24 PM EDT -----  1. Skin, left post mid helix :       NO RESIDUAL DYSPLASTIC NEVUS, MARGINS FREE   Margins free post shave removal - please call patient ----- Message ----- From: Interface, Lab In [facility] Sent: 10/21/2024   5:19 PM EDT To: Rexene Rattler, MD

## 2024-10-28 NOTE — Telephone Encounter (Signed)
 Left message advising patient shave removal of DN at left post mid helix was margins free. Patient to call back with any questions or concerns.

## 2024-12-03 ENCOUNTER — Emergency Department
Admission: EM | Admit: 2024-12-03 | Discharge: 2024-12-04 | Disposition: A | Attending: Emergency Medicine | Admitting: Emergency Medicine

## 2024-12-03 ENCOUNTER — Emergency Department

## 2024-12-03 ENCOUNTER — Other Ambulatory Visit: Payer: Self-pay

## 2024-12-03 NOTE — ED Provider Notes (Signed)
 [facility] Provider Note    Event Date/Time   First MD Initiated Contact with Patient 12/03/24 2349     (approximate)   History   Cough   HPI {Remember to add pertinent medical, surgical, social, and/or OB history to HPI:1} [name] Horn is a 48 y.o. female  RA, malignant uterine sarcoma s/p TAH, nonsustained Atrial tach with loop recorder, IgG subclass deficiency, Eosinphilic esophagitis, Seropositive RA, tracheobronchomalacia, keratoconjunctivitis        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   Herpes labialis 08/12/2020   Hypokalemia 05/01/2023   IBD (inflammatory bowel disease)    Intertrigo 06/22/2020   Leukopenia 07/06/2021   Lymphedema    Malaise 12/30/2020   RA (rheumatoid arthritis) (HCC)    Rheumatoid arthritis (HCC) 08/12/2020   Transaminitis 08/12/2020   Uterine cancer (HCC)    *** dvt   Physical Exam   Triage Vital Signs: ED Triage Vitals  Encounter Vitals Group     BP 12/03/24 2104 136/78     Girls Systolic BP Percentile --      Girls Diastolic BP Percentile --      Boys Systolic BP Percentile --      Boys Diastolic BP Percentile --      Pulse Rate 12/03/24 2104 78     Resp 12/03/24 2104 19     Temp 12/03/24 2104 97.9 F (36.6 C)     Temp src --      SpO2 12/03/24 2104 98 %     Weight 12/03/24 2103 169 lb (76.7 kg)     Height 12/03/24 2103 5' 4 (1.626 m)     Head Circumference --      Peak Flow --      Pain Score 12/03/24 2103 7     Pain Loc --      Pain Education --      Exclude from Growth Chart --     Most recent vital  signs: Vitals:   12/03/24 2104  BP: 136/78  Pulse: 78  Resp: 19  Temp: 97.9 F (36.6 C)  SpO2: 98%    {Only need to document appropriate and relevant physical exam:1} General: Awake, no distress. *** CV:  Good peripheral perfusion. *** Resp:  Normal effort. *** Abd:  No distention. *** Other:  ***   ED Results / Procedures / Treatments   Labs (all labs ordered are listed, but only abnormal results are displayed) Labs Reviewed - No data to display   EKG  ***   RADIOLOGY *** {USE THE WORD INTERPRETED!! You MUST document your own interpretation of imaging, as well as the fact that you reviewed the radiologist's report!:1}   PROCEDURES:  Critical Care performed: {CriticalCareYesNo:19197::Yes, see critical care procedure note(s),No}  Procedures   MEDICATIONS ORDERED IN ED: Medications - No data to display   IMPRESSION / MDM / ASSESSMENT AND PLAN / ED COURSE  I reviewed the triage vital signs and the nursing notes.  Differential diagnosis includes, but is not limited to, ***  Patient's presentation is most consistent with {EM COPA:27473}  *** {If the patient is on the monitor, remove the brackets and asterisks on the sentence below and remember to document it as a Procedure as well. Otherwise delete the sentence below:1} {**The patient is on the cardiac monitor to evaluate for evidence of arrhythmia and/or significant heart rate changes.**} {Remember to include, when applicable, any/all of the following data: independent review of imaging independent review of labs (comment specifically on pertinent positives and negatives) review of specific prior hospitalizations, PCP/specialist notes, etc. discuss meds given and prescribed document any discussion with consultants (including hospitalists) any clinical decision tools you used and why (PECARN, NEXUS, etc.) did you consider admitting the patient? document social  determinants of health affecting patient's care (homelessness, inability to follow up in a timely fashion, etc) document any pre-existing conditions increasing risk on current visit (e.g. diabetes and HTN increasing danger of high-risk chest pain/ACS) describes what meds you gave (especially parenteral) and why any other interventions?:1} Clinical Course as of 12/04/24 0003  Tue Dec 03, 2024  2353 Chest x-ray inter by me is clear, although there is some type of device either implanted in the left breast or overlying it [MQ]    Clinical Course User Index [MQ] Dicky Anes, MD     FINAL CLINICAL IMPRESSION(S) / ED DIAGNOSES   Final diagnoses:  None     Rx / DC Orders   ED Discharge Orders     None        Note:  This document was prepared using Dragon voice recognition software and may include unintentional dictation errors.

## 2024-12-03 NOTE — ED Triage Notes (Signed)
 Pt reports she was dx with COVID a few weeks ago and has not felt any better. Pt reports cough still and worsening sob.

## 2024-12-04 ENCOUNTER — Other Ambulatory Visit: Payer: Self-pay

## 2024-12-04 ENCOUNTER — Emergency Department

## 2024-12-04 LAB — BASIC METABOLIC PANEL WITH GFR
Anion gap: 12 (ref 5–15)
BUN: 7 mg/dL (ref 6–20)
CO2: 27 mmol/L (ref 22–32)
Calcium: 9 mg/dL (ref 8.9–10.3)
Chloride: 101 mmol/L (ref 98–111)
Creatinine, Ser: 0.7 mg/dL (ref 0.44–1.00)
GFR, Estimated: >60 mL/min (ref >60)
Glucose, Bld: 93 mg/dL (ref 70–99)
Potassium: 4 mmol/L (ref 3.5–5.1)
Sodium: 140 mmol/L (ref 135–145)

## 2024-12-04 LAB — CBC
HCT: 36.3 % (ref 36.0–46.0)
Hemoglobin: 12.7 g/dL (ref 12.0–15.0)
MCH: 35.1 pg — ABNORMAL HIGH (ref 26.0–34.0)
MCHC: 35 g/dL (ref 30.0–36.0)
MCV: 100.3 fL — ABNORMAL HIGH (ref 80.0–100.0)
Platelets: 229 K/uL (ref 150–400)
RBC: 3.62 MIL/uL — ABNORMAL LOW (ref 3.87–5.11)
RDW: 12.3 % (ref 11.5–15.5)
WBC: 10.5 K/uL (ref 4.0–10.5)
nRBC: 0 % (ref 0.0–0.2)

## 2024-12-04 LAB — RESP PANEL BY RT-PCR (RSV, FLU A&B, COVID)  RVPGX2
Influenza A by PCR: NEGATIVE
Influenza B by PCR: NEGATIVE
Resp Syncytial Virus by PCR: NEGATIVE
SARS Coronavirus 2 by RT PCR: POSITIVE — AB

## 2024-12-04 LAB — D-DIMER, QUANTITATIVE: D-Dimer, Quant: 0.95 ug{FEU}/mL — ABNORMAL HIGH (ref 0.00–0.50)

## 2024-12-04 MED ORDER — AMOXICILLIN-POT CLAVULANATE 875-125 MG PO TABS
1.0000 | ORAL_TABLET | Freq: Once | ORAL | Status: AC
  Administered 2024-12-04: 1 via ORAL
  Filled 2024-12-04: qty 1

## 2024-12-04 MED ORDER — AMOXICILLIN-POT CLAVULANATE 875-125 MG PO TABS: 1.0000 | ORAL_TABLET | Freq: Two times a day (BID) | ORAL | 0 refills | Status: AC

## 2024-12-04 MED ORDER — HYDROCODONE-ACETAMINOPHEN 5-325 MG PO TABS
1.0000 | ORAL_TABLET | Freq: Once | ORAL | Status: AC
  Administered 2024-12-04: 1 via ORAL
  Filled 2024-12-04: qty 1

## 2024-12-04 MED ORDER — IOHEXOL 350 MG/ML SOLN
75.0000 mL | Freq: Once | INTRAVENOUS | Status: AC | PRN
  Administered 2024-12-04: 55 mL via INTRAVENOUS

## 2024-12-04 MED ORDER — PROCHLORPERAZINE MALEATE 10 MG PO TABS
10.0000 mg | ORAL_TABLET | Freq: Once | ORAL | Status: AC
  Administered 2024-12-04: 10 mg via ORAL
  Filled 2024-12-04: qty 1

## 2024-12-04 MED ORDER — HYDROCORTISONE SOD SUC (PF) 100 MG IJ SOLR
50.0000 mg | Freq: Once | INTRAMUSCULAR | Status: AC
  Administered 2024-12-04: 50 mg via INTRAVENOUS
  Filled 2024-12-04: qty 1

## 2024-12-04 MED ORDER — SODIUM CHLORIDE 0.9 % IV BOLUS
500.0000 mL | Freq: Once | INTRAVENOUS | Status: AC
  Administered 2024-12-04: 500 mL via INTRAVENOUS

## 2024-12-04 MED ORDER — AZITHROMYCIN 250 MG PO TABS: ORAL_TABLET | ORAL | 0 refills | Status: DC

## 2024-12-04 NOTE — Discharge Instructions (Addendum)
 Given your medical history we will start you on 4 days of azithromycin  for treatment of potential atypical lung infection.  Continue your other current treatments.  Return to the ER right away if you start experiencing increasing shortness of breath, chest pain, high fevers, feared condition is worsening or other concerns arise.  No driving today.  You have a complicated medical illness.  Make certain you take your steroid medication as prescribed, continue your current medicines.  Follow-up closely with your specialist

## 2024-12-09 ENCOUNTER — Encounter: Payer: Self-pay | Admitting: Pulmonary Disease

## 2025-01-30 ENCOUNTER — Emergency Department

## 2025-01-30 ENCOUNTER — Other Ambulatory Visit: Payer: Self-pay

## 2025-01-30 ENCOUNTER — Emergency Department
Admission: EM | Admit: 2025-01-30 | Discharge: 2025-01-30 | Disposition: A | Discharge status: Home / Self Care | Attending: Emergency Medicine | Admitting: Emergency Medicine

## 2025-01-30 DIAGNOSIS — J40 Bronchitis, not specified as acute or chronic: Secondary | ICD-10-CM | POA: Insufficient documentation

## 2025-01-30 LAB — BASIC METABOLIC PANEL WITH GFR
Anion gap: 10 (ref 5–15)
BUN: 9 mg/dL (ref 6–20)
CO2: 29 mmol/L (ref 22–32)
Calcium: 9.5 mg/dL (ref 8.9–10.3)
Chloride: 98 mmol/L (ref 98–111)
Creatinine, Ser: 0.81 mg/dL (ref 0.44–1.00)
GFR, Estimated: >60 mL/min
Glucose, Bld: 102 mg/dL — ABNORMAL HIGH (ref 70–99)
Potassium: 4.2 mmol/L (ref 3.5–5.1)
Sodium: 137 mmol/L (ref 135–145)

## 2025-01-30 LAB — CBC
HCT: 36.4 % (ref 36.0–46.0)
Hemoglobin: 12.3 g/dL (ref 12.0–15.0)
MCH: 34.5 pg — ABNORMAL HIGH (ref 26.0–34.0)
MCHC: 33.8 g/dL (ref 30.0–36.0)
MCV: 102 fL — ABNORMAL HIGH (ref 80.0–100.0)
Platelets: 269 10*3/uL (ref 150–400)
RBC: 3.57 MIL/uL — ABNORMAL LOW (ref 3.87–5.11)
RDW: 12.7 % (ref 11.5–15.5)
WBC: 7.8 10*3/uL (ref 4.0–10.5)
nRBC: 0 % (ref 0.0–0.2)

## 2025-01-30 LAB — RESP PANEL BY RT-PCR (RSV, FLU A&B, COVID)  RVPGX2
Influenza A by PCR: NEGATIVE
Influenza B by PCR: NEGATIVE
Resp Syncytial Virus by PCR: NEGATIVE
SARS Coronavirus 2 by RT PCR: NEGATIVE

## 2025-01-30 LAB — TROPONIN T, HIGH SENSITIVITY: Troponin T High Sensitivity: 7 ng/L (ref 0–19)

## 2025-01-30 LAB — LACTIC ACID, PLASMA: Lactic Acid, Venous: 1.3 mmol/L (ref 0.5–1.9)

## 2025-01-30 MED ORDER — IPRATROPIUM-ALBUTEROL 0.5-2.5 (3) MG/3ML IN SOLN: 3.0000 mL | RESPIRATORY_TRACT | 0 refills | Status: AC | PRN

## 2025-01-30 MED ORDER — AZITHROMYCIN 250 MG PO TABS: 250.0000 mg | ORAL_TABLET | Freq: Every day | ORAL | 0 refills | Status: AC

## 2025-01-30 MED ORDER — OXYCODONE-ACETAMINOPHEN 5-325 MG PO TABS
1.0000 | ORAL_TABLET | Freq: Once | ORAL | Status: AC
  Administered 2025-01-30: 1 via ORAL
  Filled 2025-01-30: qty 1

## 2025-01-30 MED ORDER — AZITHROMYCIN 500 MG PO TABS
500.0000 mg | ORAL_TABLET | Freq: Once | ORAL | Status: AC
  Administered 2025-01-30: 500 mg via ORAL
  Filled 2025-01-30: qty 1

## 2025-01-30 MED ORDER — OXYCODONE HCL 5 MG PO TABS
5.0000 mg | ORAL_TABLET | Freq: Once | ORAL | Status: AC
  Administered 2025-01-30: 5 mg via ORAL
  Filled 2025-01-30: qty 1

## 2025-01-30 MED ORDER — IPRATROPIUM-ALBUTEROL 0.5-2.5 (3) MG/3ML IN SOLN
3.0000 mL | Freq: Once | RESPIRATORY_TRACT | Status: AC
  Administered 2025-01-30: 3 mL via RESPIRATORY_TRACT
  Filled 2025-01-30: qty 3

## 2025-01-30 NOTE — ED Provider Notes (Signed)
 "   [facility] Emergency Department Provider Note     Event Date/Time   First MD Initiated Contact with Patient 01/30/25 1658     (approximate)   History   Shortness of Breath   HPI  [name] Horn is a 49 y.o. female with a history of bronchitis, pneumonia, RA, IBD, and prior DVT not currently on anticoagulation, presents to the ED endorsing ongoing chest pain, shortness of breath, and cough.  She reports a productive cough that has been persistent for the last week.  She was recently treated for pneumonia with a superimposed COVID infection about 3 weeks prior.  Patient would also endorse some intermittent fevers with a Tmax of 102 degrees earlier in the week.  She denies any nausea, vomiting, or diaphoresis.  Physical Exam   Triage Vital Signs: ED Triage Vitals [01/30/25 1447]  Encounter Vitals Group     BP 129/88     Girls Systolic BP Percentile      Girls Diastolic BP Percentile      Boys Systolic BP Percentile      Boys Diastolic BP Percentile      Pulse Rate (!) 117     Resp (!) 21     Temp 98.4 F (36.9 C)     Temp src      SpO2 100 %     Weight 180 lb (81.6 kg)     Height 5' 4 (1.626 m)     Head Circumference      Peak Flow      Pain Score 6     Pain Loc      Pain Education      Exclude from Growth Chart     Most recent vital signs: Vitals:   01/30/25 1710 01/30/25 1840  BP: 111/76   Pulse: (!) 106   Resp: 20   Temp:  98.3 F (36.8 C)  SpO2: 99%     General Awake, no distress. NAD HEENT NCAT. PERRL. EOMI. No rhinorrhea. Mucous membranes are moist.  CV:  Good peripheral perfusion. RRR RESP:  Normal effort.  Bilateral end expiratory wheeze and rhonchi noted ABD:  No distention.    ED Results / Procedures / Treatments   Labs (all labs ordered are listed, but only abnormal results are displayed) Labs Reviewed  BASIC METABOLIC PANEL WITH GFR - Abnormal; Notable for the following components:      Result Value    Glucose, Bld 102 (*)    All other components within normal limits  CBC - Abnormal; Notable for the following components:   RBC 3.57 (*)    MCV 102.0 (*)    MCH 34.5 (*)    All other components within normal limits  RESP PANEL BY RT-PCR (RSV, FLU A&B, COVID)  RVPGX2  LACTIC ACID, PLASMA  TROPONIN T, HIGH SENSITIVITY    EKG  Vent. rate 106 BPM  PR interval 154 ms  QRS duration 74 ms  QT/QTcB 328/435 ms  P-R-T axes 35 -12 23  Sinus tachycardia  Possible Anterior infarct    RADIOLOGY  I personally viewed and evaluated these images as part of my medical decision making, as well as reviewing the written report by the radiologist.  ED Provider Interpretation: No acute findings  DG Chest 2 View Result Date: 01/30/2025 CLINICAL DATA:  Chest pain and shortness of breath. EXAM: CHEST - 2 VIEW COMPARISON:  December 03, 2024 FINDINGS: The heart size and mediastinal contours are within normal limits.  A radiopaque loop recorder device is noted. Low lung volumes are noted. Both lungs are clear. Radiopaque surgical clips are seen within the right upper quadrant. The visualized skeletal structures are unremarkable. IMPRESSION: No active cardiopulmonary disease. Electronically Signed   By: Suzen Dials M.D.   On: 01/30/2025 15:37    PROCEDURES:  Critical Care performed: No  Procedures   MEDICATIONS ORDERED IN ED: Medications  ipratropium-albuterol  (DUONEB) 0.5-2.5 (3) MG/3ML nebulizer solution 3 mL (3 mLs Nebulization Given 01/30/25 1812)  oxyCODONE -acetaminophen  (PERCOCET/ROXICET) 5-325 MG per tablet 1 tablet (1 tablet Oral Given 01/30/25 1841)    And  oxyCODONE  (Oxy IR/ROXICODONE ) immediate release tablet 5 mg (5 mg Oral Given 01/30/25 1840)  azithromycin  (ZITHROMAX ) tablet 500 mg (500 mg Oral Given 01/30/25 1841)     IMPRESSION / MDM / ASSESSMENT AND PLAN / ED COURSE  I reviewed the triage vital signs and the nursing notes.                              Differential diagnosis  includes, but is not limited to, viral syndrome, bronchitis including COPD exacerbation, pneumonia, reactive airway disease including asthma, CHF including exacerbation with or without pulmonary/interstitial edema, pneumothorax, ACS, thoracic trauma, and pulmonary embolism.  Patient's presentation is most consistent with acute complicated illness / injury requiring diagnostic workup.  Patient's diagnosis is consistent with Acute bronchitis versus subacute pneumonia.  Patient with recent exam and workup at this time.  No acute lab abnormalities are noted.  Lactic acid is normal at 1.3, and troponin is negative at 7.  Viral panel test negative at this time.  No x-ray evidence of any acute consolidation based on my interpretation.  EKG shows sinus tachycardia with no malignant arrhythmia.  Patient is satting at 100% with a reassuring respiratory rate.  I suspect the heart rate elevation, which is downtrending from triage, likely due to inflammation as well as recent DuoNeb.  Low concern for PE with a recent pneumonia and chest pain relative to coughing.  Patient will be discharged home with prescriptions for azithromycin  and DuoNeb solution.  Patient is endorsing improvement in overall symptoms.  Patient is to follow up with her primary provider as discussed, as needed or otherwise directed. Patient is given ED precautions to return to the ED for any worsening or new symptoms.  Clinical Course as of 01/30/25 2024  Thu Jan 30, 2025  8158 Patient endorsing significant proved meant after DuoNeb treatment in the ED.  She is also been given a dose of her home narcotic pain medicine. [JM]    Clinical Course User Index [JM] Shenica Holzheimer, Candida LULLA Kings, PA-C    FINAL CLINICAL IMPRESSION(S) / ED DIAGNOSES   Final diagnoses:  Bronchitis     Rx / DC Orders   ED Discharge Orders          Ordered    azithromycin  (ZITHROMAX ) 250 MG tablet  Daily        01/30/25 1823    ipratropium-albuterol  (DUONEB) 0.5-2.5  (3) MG/3ML SOLN  Every 4 hours PRN        01/30/25 1915             Note:  This document was prepared using Dragon voice recognition software and may include unintentional dictation errors.    Loyd Candida LULLA Kings, PA-C 01/30/25 2028    Arlander Charleston, MD 01/30/25 2231  "

## 2025-01-30 NOTE — ED Triage Notes (Signed)
 Pt to ED for chest pain, shob started this week. Reports PNA in December. Coughing up phlegm.

## 2025-01-30 NOTE — Discharge Instructions (Addendum)
 Your exam, labs, EKG, and chest x-ray are normal and reassuring.  No signs of an acute pneumonia.  Take the prescription antibiotic as directed.  Use the DuoNeb solution in place of the albuterol  nebulizer as directed.  Follow-up with your primary provider for ongoing evaluation.

## 2025-01-31 ENCOUNTER — Inpatient Hospital Stay: Admission: EM | Admit: 2025-01-31 | Source: Home / Self Care | Admitting: Internal Medicine

## 2025-01-31 ENCOUNTER — Encounter: Payer: Self-pay | Admitting: Emergency Medicine

## 2025-01-31 ENCOUNTER — Emergency Department

## 2025-01-31 DIAGNOSIS — J45901 Unspecified asthma with (acute) exacerbation: Secondary | ICD-10-CM

## 2025-01-31 DIAGNOSIS — J189 Pneumonia, unspecified organism: Secondary | ICD-10-CM | POA: Diagnosis present

## 2025-01-31 DIAGNOSIS — R058 Other specified cough: Secondary | ICD-10-CM

## 2025-01-31 DIAGNOSIS — E2740 Unspecified adrenocortical insufficiency: Principal | ICD-10-CM

## 2025-01-31 DIAGNOSIS — J988 Other specified respiratory disorders: Secondary | ICD-10-CM

## 2025-01-31 LAB — COMPREHENSIVE METABOLIC PANEL WITH GFR
ALT: 24 U/L (ref 0–44)
AST: 27 U/L (ref 15–41)
Albumin: 4 g/dL (ref 3.5–5.0)
Alkaline Phosphatase: 107 U/L (ref 38–126)
Anion gap: 11 (ref 5–15)
BUN: 8 mg/dL (ref 6–20)
CO2: 28 mmol/L (ref 22–32)
Calcium: 9 mg/dL (ref 8.9–10.3)
Chloride: 100 mmol/L (ref 98–111)
Creatinine, Ser: 0.69 mg/dL (ref 0.44–1.00)
GFR, Estimated: >60 mL/min
Glucose, Bld: 146 mg/dL — ABNORMAL HIGH (ref 70–99)
Potassium: 3.6 mmol/L (ref 3.5–5.1)
Sodium: 138 mmol/L (ref 135–145)
Total Bilirubin: 0.2 mg/dL (ref 0.0–1.2)
Total Protein: 6.4 g/dL — ABNORMAL LOW (ref 6.5–8.1)

## 2025-01-31 LAB — CBC WITH DIFFERENTIAL/PLATELET
Abs Immature Granulocytes: 0.03 10*3/uL (ref 0.00–0.07)
Basophils Absolute: 0 10*3/uL (ref 0.0–0.1)
Basophils Relative: 0 %
Eosinophils Absolute: 0.3 10*3/uL (ref 0.0–0.5)
Eosinophils Relative: 4 %
HCT: 34.4 % — ABNORMAL LOW (ref 36.0–46.0)
Hemoglobin: 11.7 g/dL — ABNORMAL LOW (ref 12.0–15.0)
Immature Granulocytes: 0 %
Lymphocytes Relative: 26 %
Lymphs Abs: 1.9 10*3/uL (ref 0.7–4.0)
MCH: 34.9 pg — ABNORMAL HIGH (ref 26.0–34.0)
MCHC: 34 g/dL (ref 30.0–36.0)
MCV: 102.7 fL — ABNORMAL HIGH (ref 80.0–100.0)
Monocytes Absolute: 0.4 10*3/uL (ref 0.1–1.0)
Monocytes Relative: 5 %
Neutro Abs: 4.7 10*3/uL (ref 1.7–7.7)
Neutrophils Relative %: 65 %
Platelets: 246 10*3/uL (ref 150–400)
RBC: 3.35 MIL/uL — ABNORMAL LOW (ref 3.87–5.11)
RDW: 13 % (ref 11.5–15.5)
WBC: 7.3 10*3/uL (ref 4.0–10.5)
nRBC: 0 % (ref 0.0–0.2)

## 2025-01-31 LAB — PROTIME-INR
INR: 1 (ref 0.8–1.2)
Prothrombin Time: 13.9 s (ref 11.4–15.2)

## 2025-01-31 LAB — CBC
HCT: 34.4 % — ABNORMAL LOW (ref 36.0–46.0)
Hemoglobin: 12.1 g/dL (ref 12.0–15.0)
MCH: 35.3 pg — ABNORMAL HIGH (ref 26.0–34.0)
MCHC: 35.2 g/dL (ref 30.0–36.0)
MCV: 100.3 fL — ABNORMAL HIGH (ref 80.0–100.0)
Platelets: 256 10*3/uL (ref 150–400)
RBC: 3.43 MIL/uL — ABNORMAL LOW (ref 3.87–5.11)
RDW: 13 % (ref 11.5–15.5)
WBC: 12.1 10*3/uL — ABNORMAL HIGH (ref 4.0–10.5)
nRBC: 0 % (ref 0.0–0.2)

## 2025-01-31 LAB — RESP PANEL BY RT-PCR (RSV, FLU A&B, COVID)  RVPGX2
Influenza A by PCR: NEGATIVE
Influenza B by PCR: NEGATIVE
Resp Syncytial Virus by PCR: NEGATIVE
SARS Coronavirus 2 by RT PCR: NEGATIVE

## 2025-01-31 LAB — CREATININE, SERUM
Creatinine, Ser: 0.74 mg/dL (ref 0.44–1.00)
GFR, Estimated: >60 mL/min

## 2025-01-31 LAB — GLUCOSE, CAPILLARY: Glucose-Capillary: 166 mg/dL — ABNORMAL HIGH (ref 70–99)

## 2025-01-31 LAB — LACTIC ACID, PLASMA: Lactic Acid, Venous: 1.8 mmol/L (ref 0.5–1.9)

## 2025-01-31 MED ORDER — SODIUM CHLORIDE 0.9 % IV SOLN
2.0000 g | INTRAVENOUS | Status: AC
  Filled 2025-01-31: qty 20

## 2025-01-31 MED ORDER — ACETAMINOPHEN 325 MG PO TABS: 650.0000 mg | ORAL_TABLET | Freq: Every day | ORAL | Status: AC | PRN

## 2025-01-31 MED ORDER — FOLIC ACID 1 MG PO TABS
1.0000 mg | ORAL_TABLET | Freq: Every day | ORAL | Status: AC
  Administered 2025-01-31: 1 mg via ORAL
  Filled 2025-01-31: qty 1

## 2025-01-31 MED ORDER — PANTOPRAZOLE SODIUM 40 MG PO TBEC
40.0000 mg | DELAYED_RELEASE_TABLET | Freq: Two times a day (BID) | ORAL | Status: AC
  Administered 2025-01-31 (×2): 40 mg via ORAL
  Filled 2025-01-31 (×2): qty 1

## 2025-01-31 MED ORDER — ACETAMINOPHEN 650 MG RE SUPP: 650.0000 mg | Freq: Four times a day (QID) | RECTAL | Status: AC | PRN

## 2025-01-31 MED ORDER — POLYVINYL ALCOHOL 1.4 % OP SOLN: 1.0000 [drp] | Freq: Two times a day (BID) | OPHTHALMIC | Status: AC | PRN

## 2025-01-31 MED ORDER — LORATADINE 10 MG PO TABS
10.0000 mg | ORAL_TABLET | Freq: Every day | ORAL | Status: AC
  Administered 2025-01-31: 10 mg via ORAL
  Filled 2025-01-31: qty 1

## 2025-01-31 MED ORDER — AZITHROMYCIN 500 MG PO TABS: 500.0000 mg | ORAL_TABLET | Freq: Every day | ORAL | Status: DC

## 2025-01-31 MED ORDER — OXYCODONE-ACETAMINOPHEN 10-325 MG PO TABS: 1.0000 | ORAL_TABLET | Freq: Every day | ORAL | Status: DC | PRN

## 2025-01-31 MED ORDER — OXYCODONE HCL 5 MG PO TABS
10.0000 mg | ORAL_TABLET | Freq: Once | ORAL | Status: AC
  Administered 2025-01-31: 10 mg via ORAL
  Filled 2025-01-31: qty 2

## 2025-01-31 MED ORDER — SIMETHICONE 80 MG PO CHEW
80.0000 mg | CHEWABLE_TABLET | Freq: Four times a day (QID) | ORAL | Status: AC
  Administered 2025-01-31 (×3): 80 mg via ORAL
  Filled 2025-01-31 (×5): qty 1

## 2025-01-31 MED ORDER — POLYETHYL GLYCOL-PROPYL GLYCOL 0.4-0.3 % OP GEL: 1.0000 | Freq: Two times a day (BID) | OPHTHALMIC | Status: DC | PRN

## 2025-01-31 MED ORDER — TIZANIDINE HCL 2 MG PO TABS
2.0000 mg | ORAL_TABLET | Freq: Three times a day (TID) | ORAL | Status: AC
  Administered 2025-01-31 (×2): 2 mg via ORAL
  Filled 2025-01-31 (×2): qty 1

## 2025-01-31 MED ORDER — ONDANSETRON HCL 4 MG/2ML IJ SOLN: 4.0000 mg | Freq: Four times a day (QID) | INTRAMUSCULAR | Status: AC | PRN

## 2025-01-31 MED ORDER — METHYLPREDNISOLONE SODIUM SUCC 125 MG IJ SOLR
60.0000 mg | Freq: Two times a day (BID) | INTRAMUSCULAR | Status: AC
  Administered 2025-01-31: 60 mg via INTRAVENOUS
  Filled 2025-01-31: qty 2

## 2025-01-31 MED ORDER — POLYETHYLENE GLYCOL 3350 17 G PO PACK: 17.0000 g | PACK | Freq: Every day | ORAL | Status: AC | PRN

## 2025-01-31 MED ORDER — FLUTICASONE PROPIONATE 50 MCG/ACT NA SUSP
2.0000 | Freq: Every day | NASAL | Status: AC
  Administered 2025-01-31: 2 via NASAL
  Filled 2025-01-31: qty 16

## 2025-01-31 MED ORDER — SODIUM CHLORIDE 0.9% FLUSH: 3.0000 mL | INTRAVENOUS | Status: AC | PRN

## 2025-01-31 MED ORDER — VALACYCLOVIR HCL 500 MG PO TABS
500.0000 mg | ORAL_TABLET | Freq: Two times a day (BID) | ORAL | Status: AC
  Administered 2025-01-31 (×2): 500 mg via ORAL
  Filled 2025-01-31 (×2): qty 1

## 2025-01-31 MED ORDER — GABAPENTIN 400 MG PO CAPS
400.0000 mg | ORAL_CAPSULE | Freq: Two times a day (BID) | ORAL | Status: AC
  Administered 2025-01-31 (×2): 400 mg via ORAL
  Filled 2025-01-31 (×2): qty 1

## 2025-01-31 MED ORDER — ACETAMINOPHEN 325 MG PO TABS: 325.0000 mg | ORAL_TABLET | Freq: Every day | ORAL | Status: DC | PRN

## 2025-01-31 MED ORDER — HYDROCORTISONE SOD SUC (PF) 100 MG IJ SOLR
100.0000 mg | Freq: Once | INTRAMUSCULAR | Status: AC
  Administered 2025-01-31: 100 mg via INTRAVENOUS
  Filled 2025-01-31: qty 2

## 2025-01-31 MED ORDER — TOPIRAMATE 100 MG PO TABS
100.0000 mg | ORAL_TABLET | Freq: Every day | ORAL | Status: AC
  Administered 2025-01-31: 100 mg via ORAL
  Filled 2025-01-31: qty 4

## 2025-01-31 MED ORDER — LACTULOSE 10 GM/15ML PO SOLN: 20.0000 g | Freq: Every day | ORAL | Status: AC | PRN

## 2025-01-31 MED ORDER — FAMOTIDINE 20 MG PO TABS
40.0000 mg | ORAL_TABLET | Freq: Every day | ORAL | Status: AC
  Administered 2025-01-31: 40 mg via ORAL
  Filled 2025-01-31: qty 2

## 2025-01-31 MED ORDER — IPRATROPIUM-ALBUTEROL 0.5-2.5 (3) MG/3ML IN SOLN
9.0000 mL | Freq: Once | RESPIRATORY_TRACT | Status: AC
  Administered 2025-01-31: 9 mL via RESPIRATORY_TRACT
  Filled 2025-01-31: qty 9

## 2025-01-31 MED ORDER — GUAIFENESIN ER 600 MG PO TB12
600.0000 mg | ORAL_TABLET | Freq: Two times a day (BID) | ORAL | Status: AC
  Administered 2025-01-31 (×2): 600 mg via ORAL
  Filled 2025-01-31 (×2): qty 1

## 2025-01-31 MED ORDER — NYSTATIN 100000 UNIT/ML MT SUSP: 10.0000 mL | Freq: Three times a day (TID) | OROMUCOSAL | Status: AC | PRN

## 2025-01-31 MED ORDER — SODIUM CHLORIDE 0.9 % IV SOLN: 250.0000 mL | INTRAVENOUS | Status: AC | PRN

## 2025-01-31 MED ORDER — ENOXAPARIN SODIUM 40 MG/0.4ML IJ SOSY
40.0000 mg | PREFILLED_SYRINGE | INTRAMUSCULAR | Status: AC
  Administered 2025-01-31: 40 mg via SUBCUTANEOUS
  Filled 2025-01-31: qty 0.4

## 2025-01-31 MED ORDER — PRAVASTATIN SODIUM 20 MG PO TABS
40.0000 mg | ORAL_TABLET | Freq: Every day | ORAL | Status: AC
  Administered 2025-01-31: 40 mg via ORAL
  Filled 2025-01-31: qty 2

## 2025-01-31 MED ORDER — ONDANSETRON HCL 4 MG PO TABS: 4.0000 mg | ORAL_TABLET | Freq: Four times a day (QID) | ORAL | Status: AC | PRN

## 2025-01-31 MED ORDER — SODIUM CHLORIDE 0.9 % IV SOLN
1.0000 g | Freq: Once | INTRAVENOUS | Status: AC
  Administered 2025-01-31: 1 g via INTRAVENOUS
  Filled 2025-01-31: qty 10

## 2025-01-31 MED ORDER — GABAPENTIN 250 MG/5ML PO SOLN: 400.0000 mg | Freq: Two times a day (BID) | ORAL | Status: DC

## 2025-01-31 MED ORDER — BUDESONIDE 0.25 MG/2ML IN SUSP
0.2500 mg | Freq: Two times a day (BID) | RESPIRATORY_TRACT | Status: AC
  Administered 2025-01-31 (×2): 0.25 mg via RESPIRATORY_TRACT
  Filled 2025-01-31 (×2): qty 2

## 2025-01-31 MED ORDER — LACTATED RINGERS IV BOLUS (SEPSIS)
1000.0000 mL | Freq: Once | INTRAVENOUS | Status: AC
  Administered 2025-01-31: 1000 mL via INTRAVENOUS

## 2025-01-31 MED ORDER — ALBUTEROL SULFATE (2.5 MG/3ML) 0.083% IN NEBU: 2.5000 mg | INHALATION_SOLUTION | RESPIRATORY_TRACT | Status: AC | PRN

## 2025-01-31 MED ORDER — RIZATRIPTAN BENZOATE 10 MG PO TBDP: 10.0000 mg | ORAL_TABLET | Freq: Every day | ORAL | Status: DC | PRN

## 2025-01-31 MED ORDER — HYDROXYZINE HCL 25 MG PO TABS: 25.0000 mg | ORAL_TABLET | Freq: Two times a day (BID) | ORAL | Status: AC | PRN

## 2025-01-31 MED ORDER — OXYCODONE HCL 5 MG PO TABS
10.0000 mg | ORAL_TABLET | Freq: Every day | ORAL | Status: AC | PRN
  Administered 2025-01-31: 10 mg via ORAL
  Filled 2025-01-31 (×3): qty 2

## 2025-01-31 MED ORDER — SODIUM CHLORIDE 0.9 % IV SOLN
100.0000 mg | Freq: Two times a day (BID) | INTRAVENOUS | Status: AC
  Administered 2025-01-31: 100 mg via INTRAVENOUS
  Filled 2025-01-31 (×3): qty 100

## 2025-01-31 MED ORDER — OXYMETAZOLINE HCL 0.05 % NA SOLN
1.0000 | Freq: Two times a day (BID) | NASAL | Status: AC
  Administered 2025-01-31: 1 via NASAL
  Filled 2025-01-31: qty 30

## 2025-01-31 MED ORDER — IPRATROPIUM-ALBUTEROL 0.5-2.5 (3) MG/3ML IN SOLN
3.0000 mL | Freq: Four times a day (QID) | RESPIRATORY_TRACT | Status: AC
  Administered 2025-01-31 (×2): 3 mL via RESPIRATORY_TRACT
  Filled 2025-01-31 (×2): qty 3

## 2025-01-31 MED ORDER — ACETAMINOPHEN 325 MG PO TABS: 650.0000 mg | ORAL_TABLET | Freq: Four times a day (QID) | ORAL | Status: AC | PRN

## 2025-01-31 MED ORDER — SERTRALINE HCL 50 MG PO TABS
25.0000 mg | ORAL_TABLET | Freq: Every day | ORAL | Status: AC
  Administered 2025-01-31: 25 mg via ORAL
  Filled 2025-01-31: qty 1

## 2025-01-31 MED ORDER — BENZONATATE 100 MG PO CAPS: 200.0000 mg | ORAL_CAPSULE | Freq: Three times a day (TID) | ORAL | Status: AC | PRN

## 2025-01-31 MED ORDER — OXYCODONE HCL 5 MG PO TABS: 10.0000 mg | ORAL_TABLET | Freq: Every day | ORAL | Status: DC | PRN

## 2025-01-31 MED ORDER — SODIUM CHLORIDE 0.9% FLUSH
3.0000 mL | Freq: Two times a day (BID) | INTRAVENOUS | Status: AC
  Administered 2025-01-31: 3 mL via INTRAVENOUS

## 2025-01-31 NOTE — ED Notes (Signed)
 Pt given cup of water  per pt request and RN approval.

## 2025-01-31 NOTE — ED Triage Notes (Addendum)
 Pt to triage via w/c with congested cough noted; reports was here yesterday and dx bronchitis/pneumonia; received duonebs and antibiotics but c/o increasing SHOB and nonprod cough with fever

## 2025-01-31 NOTE — ED Provider Notes (Signed)
----------------------------------------- °  7:08 AM on 01/31/2025 -----------------------------------------  Blood pressure 111/75, pulse 98, temperature 97.8 F (36.6 C), temperature source Oral, resp. rate (!) 24, height 5' 4 (1.626 m), weight 81.2 kg, SpO2 96%.  Assuming care from Dr. Cyrena.  In short, [name] Horn is a 49 y.o. female with a chief complaint of cough and shortness of breath.  Refer to the original H&P for additional details.  The current plan of care is to follow-up lab results, anticipate admission for sepsis.  ----------------------------------------- 11:25 AM on 01/31/2025 ----------------------------------------- Additional labs are unremarkable, case discussed with hospitalist for admission for further treatment of sepsis secondary to pneumonia.     Medications  lactated ringers  bolus 1,000 mL (has no administration in time range)  doxycycline  (VIBRAMYCIN ) 100 mg in sodium chloride  0.9 % 250 mL IVPB (has no administration in time range)  cefTRIAXone  (ROCEPHIN ) 1 g in sodium chloride  0.9 % 100 mL IVPB (1 g Intravenous New Bag/Given 01/31/25 9347)  hydrocortisone  sodium succinate  (SOLU-CORTEF ) 100 MG injection 100 mg (100 mg Intravenous Given 01/31/25 0645)  ipratropium-albuterol  (DUONEB) 0.5-2.5 (3) MG/3ML nebulizer solution 9 mL (9 mLs Nebulization Given 01/31/25 0651)  oxyCODONE  (Oxy IR/ROXICODONE ) immediate release tablet 10 mg (10 mg Oral Given 01/31/25 9344)     ED Discharge Orders     None      Final diagnoses:  Adrenal insufficiency  Respiratory infection  Productive cough  Asthma with acute exacerbation, unspecified asthma severity, unspecified whether persistent      Willo Dunnings, MD 01/31/25 1126

## 2025-01-31 NOTE — H&P (Addendum)
 " Telemedicine History and Physical   Referring Provider: Carlin Palin Telemedicine Provider: Donalda Applebaum MD Provider Location: [facility] Patient Location: [facility] Referring Diagnosis: PNA Patient Name and DOB verified: yes Patient consented to Telemedicine Evaluation:yes RN virtual assistant: Rock Monica RN Video encounter time and date:01/31/2025 at 11:55 am   Patient: [name] Horn DOB: 07-Feb-1976 [facility]CP: [facility]    Chief Complaint:  Chief Complaint  Patient presents with   Cough   HPI: [name] Horn is a 49 y.o. 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  Per patient-she has had a productive cough for almost 1 week-phlegm is whitish/greenish in color.  This has been associated with some intermittent fevers-as high as 102 F earlier this week.  She also acknowledges some mild exertional dyspnea with significant wheezing-patient used her inhalers/nebulizers without any significant improvement.  She presented to the ED at [facility] yesterday with these complaints-she was evaluated-given supportive care-subsequently discharged home on azithromycin .  Unfortunately-she continued to have persistent cough-continued to have worsening shortness of breath-and subjective fevers.  She subsequently returned to the ED earlier this morning-she was reimaged with a x-ray of her chest/was suggestive of pneumonia.  Given her immunocompromise status-the hospitalist service was asked to admit this patient for further evaluation and treatment.  No headache No chest pain No nausea, vomiting or diarrhea No abdominal pain Hematuria Normal hematochezia/melena or hematemesis  Review of Systems: As mentioned in the history of  present illness. All other systems reviewed and are negative. 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   Herpes labialis 08/12/2020   Hypokalemia 05/01/2023   IBD (inflammatory bowel disease)    Intertrigo 06/22/2020   Leukopenia 07/06/2021   Lymphedema    Malaise 12/30/2020   RA (rheumatoid arthritis) (HCC)    Rheumatoid arthritis (HCC) 08/12/2020   Transaminitis 08/12/2020   Uterine cancer [facility])    Past Surgical History:  Procedure Laterality Date   APPENDECTOMY     cheek biopsy  11/2020   CHOLECYSTECTOMY     ESOPHAGOGASTRODUODENOSCOPY N/A 04/30/2017   Procedure: ESOPHAGOGASTRODUODENOSCOPY (EGD);  Surgeon: Dyane Rush, MD;  Location: [facility] ENDOSCOPY;  Service: Endoscopy;  Laterality: N/A;   HERNIA REPAIR     x2   IR LUMBAR PUNCTURE  12/11/2023   KNEE SURGERY     x3   MINIMALLY INVASIVE FORAMINOTOMY CERVICAL SPINE     C6-T1, Nitka   SAVORY DILATION N/A 04/30/2017   Procedure: SAVORY DILATION;  Surgeon: Dyane Rush, MD;  Location[facility] END[facility]COPY;  Service: Endoscopy;  Laterality: N/A;   TONSILLECTOMY     TOTAL ABDOMINAL HYSTERECTOMY     Sarcoma, s/p XRT   Social History:  reports that she has never smoked. She has been exposed to tobacco smoke. She has never used smokeless tobacco. She reports that she does not currently use alcohol . She reports current drug use.  Allergies[1]  Family History  Problem Relation Age of Onset   Hashimoto's thyroiditis Mother    Eczema Mother    Angioedema Mother    Colonic polyp Mother    Throat cancer Father    Arrhythmia Father    Angioedema Maternal  Grandfather    Hypertension Other    Hyperlipidemia Other    Colon cancer Other        grandmother    Prior to Admission medications  Medication Sig Start Date End Date Taking? Authorizing Provider  albuterol  (PROVENTIL ) (2.5 MG/3ML) 0.083% nebulizer solution Take 3 mLs (2.5 mg total) by nebulization every 4 (four) hours as needed for wheezing or shortness of breath. 11/20/22   Pahwani, Velna SAUNDERS, MD  albuterol  (VENTOLIN  HFA) 108 (90 Base) MCG/ACT inhaler Inhale 1 puff into the lungs every 4 (four) hours as needed. 04/12/23   [provider]  amphetamine -dextroamphetamine  (ADDERALL) 20 MG tablet Take 20 mg by mouth 3 (three) times daily. Patient not taking: Reported on 09/10/2024    [provider]  atropine  1 % ophthalmic solution Place 1 drop into both eyes 3 (three) times daily as needed.    [provider]  azithromycin  (ZITHROMAX ) 250 MG tablet Take 1 tablet (250 mg total) by mouth daily for 4 days. 1 tab by mouth daily for 4 days 01/31/25 02/04/25  Menshew, Candida LULLA Kings, PA-C  BELSOMRA  10 MG TABS Take 1 tablet by mouth at bedtime.    [provider]  budesonide -formoterol  (SYMBICORT ) 80-4.5 MCG/ACT inhaler Inhale 2 puffs into the lungs 2 (two) times daily. Can also take Q4 hr as needed for wheezing 11/17/22   Jillian Buttery, MD  busPIRone  (BUSPAR ) 10 MG tablet Take 10 mg by mouth 2 (two) times daily.    [provider]  cycloSPORINE  (RESTASIS ) 0.05 % ophthalmic emulsion Place 1 drop into both eyes 2 (two) times daily.    [provider]  dicyclomine  (BENTYL ) 10 MG capsule Take 10 mg by mouth daily as needed for nausea/vomiting. 01/02/19   [provider]  famotidine  (PEPCID ) 40 MG tablet Take 40 mg by mouth at bedtime. 05/02/21   [provider]  folic acid  (FOLVITE ) 1 MG tablet Take 2 tablets (2 mg total) by mouth daily. Patient taking differently: Take 1 mg by mouth daily. 10/30/19   Dolphus Reiter, MD  furosemide   (LASIX ) 20 MG tablet Take 60 mg by mouth 2 (two) times daily as needed for fluid or edema.    [provider]  gabapentin  (NEURONTIN ) 250 MG/5ML solution Take by mouth 3 (three) times daily.    [provider]  hydrocortisone  (CORTEF ) 5 MG tablet Take by mouth. 10/02/24   [provider]  hydroxychloroquine (PLAQUENIL) 200 MG tablet Take by mouth daily.    [provider]  hydrOXYzine  (ATARAX ) 25 MG tablet Take 1 tablet (25 mg total) by mouth 2 (two) times daily. Patient taking differently: Take 25 mg by mouth 2 (two) times daily as needed for itching or anxiety. 09/21/23   Dorinda Drue DASEN, MD  ipratropium-albuterol  (DUONEB) 0.5-2.5 (3) MG/3ML SOLN Take 3 mLs by nebulization every 4 (four) hours as needed. 01/30/25   Menshew, Candida LULLA Kings, PA-C  lactulose  (CHRONULAC ) 10 GM/15ML solution Take 30 mLs (20 g total) by mouth daily as needed for mild constipation. 09/21/23   Dorinda Drue DASEN, MD  methotrexate  (RHEUMATREX) 5 MG tablet Take 5 mg by mouth once a week. Caution: Chemotherapy. Protect from light. Patient taking differently: Take 15  mg by mouth once a week. Caution: Chemotherapy. Protect from light.    [provider]  midodrine  (PROAMATINE ) 10 MG tablet Take 1 tablet (10 mg total) by mouth 3 (three) times daily with meals. 12/15/23   Amin, Ankit C, MD  mupirocin  ointment (BACTROBAN ) 2 % Apply 1 Application topically 2 (two) times daily. 07/26/24   Fisher, Devere ORN, PA-C  naloxone  (NARCAN ) nasal spray 4 mg/0.1 mL Place 1 spray into the nose once. 02/13/23   [provider]  nystatin  (MYCOSTATIN ) 100000 UNIT/ML suspension Take 10 mLs (1,000,000 Units total) by mouth 3 (three) times daily. Patient taking differently: Take 10 mLs by mouth 3 (three) times daily as needed (Mouth sores). 12/07/20   Fleeta Kathie Jomarie LOISE, MD  nystatin  powder Apply 1 application topically 2 (two) times daily as needed (rash).    [provider]  ondansetron  (ZOFRAN -ODT)  8 MG disintegrating tablet Take 1 tablet (8 mg total) by mouth every 8 (eight) hours as needed. 09/21/23   Dorinda Drue DASEN, MD  oxyCODONE  (ROXICODONE ) 15 MG immediate release tablet Take 15 mg by mouth every 4 (four) hours as needed. 12/04/23   [provider]  oxyCODONE -acetaminophen  (PERCOCET) 10-325 MG tablet Take 1 tablet by mouth 5 (five) times daily as needed.    [provider]  pantoprazole  (PROTONIX ) 40 MG tablet Take 1 tablet (40 mg total) by mouth daily. Patient taking differently: Take 40 mg by mouth 2 (two) times daily. 09/21/23 09/20/24  Dorinda Drue DASEN, MD  Polyethyl Glycol-Propyl Glycol (SYSTANE) 0.4-0.3 % GEL ophthalmic gel Place 1 application  into both eyes 2 (two) times daily as needed (Eye dryness).    [provider]  Polyethyl Glycol-Propyl Glycol (SYSTANE) 0.4-0.3 % SOLN Apply 1 drop to eye in the morning, at noon, and at bedtime.    [provider]  polyethylene glycol (MIRALAX  / GLYCOLAX ) 17 g packet Take 17 g by mouth daily. Patient taking differently: Take 17 g by mouth daily as needed for moderate constipation. 09/21/23   Dorinda Drue DASEN, MD  Potassium Chloride  ER 20 MEQ TBCR Take 20 mEq by mouth daily as needed (when taking furosemide ).    [provider]  pravastatin  (PRAVACHOL ) 40 MG tablet Take 40 mg by mouth at bedtime.    [provider]  prednisoLONE  acetate (PRED FORTE ) 1 % ophthalmic suspension Place 1 drop into the left eye as needed (conjunctivitis).    [provider]  PRIVIGEN  40 GM/400ML SOLN Inject 40 g into the vein every 28 (twenty-eight) days. Patient not taking: Reported on 09/10/2024 11/02/23   Prince, Katherine E, FNP  prochlorperazine  (COMPAZINE ) 10 MG tablet Take 1 tablet (10 mg total) by mouth every 6 (six) hours as needed for nausea or vomiting. 09/07/23   Charlene Debby BROCKS, PA-C  promethazine  (PHENERGAN ) 25 MG tablet Take 1 tablet (25 mg total) by mouth every 6 (six) hours as needed for nausea or  vomiting. 09/21/23   Dorinda Drue DASEN, MD  psyllium (HYDROCIL/METAMUCIL) 95 % PACK Take 1 packet by mouth daily. Patient taking differently: Take 1 packet by mouth daily as needed for moderate constipation. 09/21/23   Dorinda Drue DASEN, MD  rizatriptan  (MAXALT -MLT) 10 MG disintegrating tablet Take 10 mg by mouth daily as needed for migraine.    [provider]  sertraline  (ZOLOFT ) 25 MG tablet Take 25 mg by mouth daily. 03/23/24   [provider]  simethicone  (MYLICON) 80 MG chewable tablet Chew 1 tablet (80 mg total) by  mouth 4 (four) times daily. 09/14/24   Jhonny Calvin NOVAK, MD  SUMAtriptan  (IMITREX ) 25 MG tablet Take 25 mg by mouth every 2 (two) hours as needed.    [provider]  tiZANidine  (ZANAFLEX ) 2 MG tablet Take 2 mg by mouth 3 (three) times daily. 08/28/23   [provider]  topiramate  (TOPAMAX ) 100 MG tablet Take 100 mg by mouth daily. Patient not taking: Reported on 09/10/2024 06/18/19   [provider]  topiramate  (TOPAMAX ) 25 MG tablet Take 25 mg by mouth at bedtime. Patient not taking: Reported on 09/10/2024    [provider]  triamcinolone  lotion (KENALOG ) 0.1 % Apply 1 application topically daily as needed (rash). Patient not taking: Reported on 09/10/2024 07/07/20   [provider]  valACYclovir  (VALTREX ) 1000 MG tablet Take 1 tablet (1,000 mg total) by mouth daily. Patient taking differently: Take 500 mg by mouth 2 (two) times daily. 08/12/20   Fleeta Kathie Jomarie LOISE, MD    Physical Exam: Vitals:   01/31/25 9487 01/31/25 0630 01/31/25 0938 01/31/25 1108  BP:  111/75 121/70 103/64  Pulse:  98 91 100  Resp:  (!) 24 20 20   Temp:   98.9 F (37.2 C)   TempSrc:      SpO2:  96% 100% 95%  Weight: 81.2 kg     Height: 5' 4 (1.626 m)      Gen Exam:Alert awake-not in any distress HEENT:atraumatic, normocephalic Chest: B/L clear to auscultation anteriorly CVS:S1S2 regular Abdomen:soft non tender, non distended Extremities:no  edema Neurology: Non focal Skin: no rash  Data Reviewed:    Latest Ref Rng & Units 01/31/2025    6:14 AM 01/30/2025    2:50 PM 12/04/2024   12:38 AM  CBC  WBC 4.0 - 10.5 K/uL 7.3  7.8  10.5   Hemoglobin 12.0 - 15.0 g/dL 88.2  87.6  87.2   Hematocrit 36.0 - 46.0 % 34.4  36.4  36.3   Platelets 150 - 400 K/uL 246  269  229        Latest Ref Rng & Units 01/31/2025    8:30 AM 01/30/2025    2:50 PM 12/04/2024   12:38 AM  BMP  Glucose 70 - 99 mg/dL 853  897  93   BUN 6 - 20 mg/dL 8  9  7    Creatinine 0.44 - 1.00 mg/dL 9.30  9.18  9.29   Sodium 135 - 145 mmol/L 138  137  140   Potassium 3.5 - 5.1 mmol/L 3.6  4.2  4.0   Chloride 98 - 111 mmol/L 100  98  101   CO2 22 - 32 mmol/L 28  29  27    Calcium  8.9 - 10.3 mg/dL 9.0  9.5  9.0      Assessment and Plan: PNA Immunocompromised-on chronic steroids-methotrexate -and has a subclass of IgG deficiency and is on SQ daily infusion at home.  Appears stable nontoxic-appearing Will send out a respiratory virus panel sputum culture, urine Streptococcus/Legionella antigen. Continue Rocephin /doxycycline  that has been started here in the ED Check MRSA screen If no improvement-May need PCCM or ID input-and will need broadening of her antibiotic coverage. Will repeat chest x-ray in a.m. Follow cultures.  Asthma exacerbation History of tracheomalacia Wheezing during my virtual evaluation-confirmed by RN. Will start scheduled IV Solu-Medrol  Scheduled bronchodilators As needed antitussives Mucinex  Incentive spirometry/flutter valve.  History of RA/inflammatory bowel disease Appears stable Hold methotrexate /Plaquenil while inpatient.  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 difficile colitis Apparently on daily subcutaneous immunoglobulin therapy-followed by ID at [facility] I have discussed with pharmacy team-they will get in touch with patient-and see if she can bring her on SQ immunoglobulin that will be  resumed in the hospital.  Apparently in the past-she was on monthly regimen of immunoglobulin-however that caused aseptic meningitis. Continue as needed nystatin  Continue Valtrex  She has not had recurrent C. difficile in the past several years-minimize antibiotics exposure as much as possible-however she is presenting with fever-and persistent cough for almost 1 week.  History of adrenal insufficiency On Solu-Cortef  at home-currently will hold as patient will be on Solu-Medrol -she needs to be resumed back on Solu-Cortef  when she is more clinically stable.  History of GERD Continue both Pepcid /PPI  Chronic pain syndrome Continue gabapentin /oxycodone .  Migraine headaches Currently headache free Continue Topamax  Continue as needed triptans  HLD Statin  Mood disorder Stable Continue Zoloft    Advance Care Planning:   Code Status: Full Code   Consults: None  Family Communication: None at bedisde  Severity of Illness: The appropriate patient status for this patient is INPATIENT. Inpatient status is judged to be reasonable and necessary in order to provide the required intensity of service to ensure the patient's safety. The patient's presenting symptoms, physical exam findings, and initial radiographic and laboratory data in the context of their chronic comorbidities is felt to place them at high risk for further clinical deterioration. Furthermore, it is not anticipated that the patient will be medically stable for discharge from the hospital within 2 midnights of admission.   * I certify that at the point of admission it is my clinical judgment that the patient will require inpatient hospital care spanning beyond 2 midnights from the point of admission due to high intensity of service, high risk for further deterioration and high frequency of surveillance required.*  Author: Donalda Applebaum, MD 01/31/2025 12:14 PM  For on call review [email or web address].      [1]  Allergies Allergen  Reactions   Cyanoacrylate Hives   Dupixent [Dupilumab] Anaphylaxis   Other Itching and Rash    Dermabond surgical glue.  Dermabond surgical glue-blisters   Sulfa Antibiotics Anaphylaxis, Rash, Shortness Of Breath and Swelling    Angioedema (also)   Sulfonamide Derivatives Hives, Shortness Of Breath and Swelling    TONGUE SWELLS   Benadryl  [Diphenhydramine  Hcl] Other (See Comments)    Hyperactivity    Sulfamethoxazole     Other Reaction(s): Angioedema   Zyrtec [Cetirizine] Itching and Swelling   Diphenhydramine     Diphenhydramine  Hcl     Other Reaction(s): Other (See Comments)  HYPERACTIVITY, RESTLESS LEG   Silicone Rash    Watch band   "

## 2025-01-31 NOTE — ED Provider Notes (Signed)
 "  [facility] Provider Note    Event Date/Time   First MD Initiated Contact with Patient 01/31/25 [phone]     (approximate)   History   Cough   HPI  [name] Horn is a 49 y.o. female   Past medical history of rheumatoid arthritis on methotrexate , adrenal insufficiency on daily hydrocortisone , prior DVT without anticoagulation currently, IBD, gastroparesis here with worsening congestion, cough, shortness of breath and was seen yesterday and received DuoNebs and azithromycin  prescription but worsening symptoms despite treatment today.   She also has asthma and feels that her lungs are tight.  Independent Historian contributed to assessment above: Husband corroborates information and past medical history as above  External Medical Documents Reviewed: Recent hospital evaluation for respiratory infectious symptoms      Physical Exam   Triage Vital Signs: ED Triage Vitals  Encounter Vitals Group     BP 01/31/25 0506 (!) 134/93     Girls Systolic BP Percentile --      Girls Diastolic BP Percentile --      Boys Systolic BP Percentile --      Boys Diastolic BP Percentile --      Pulse Rate 01/31/25 0506 (!) 106     Resp 01/31/25 0506 (!) 22     Temp 01/31/25 0506 97.8 F (36.6 C)     Temp Source 01/31/25 0506 Oral     SpO2 01/31/25 0506 95 %     Weight 01/31/25 0512 179 lb (81.2 kg)     Height 01/31/25 0512 5' 4 (1.626 m)     Head Circumference --      Peak Flow --      Pain Score 01/31/25 0512 0     Pain Loc --      Pain Education --      Exclude from Growth Chart --     Most recent vital signs: Vitals:   01/31/25 0506 01/31/25 0630  BP: (!) 134/93 111/75  Pulse: (!) 106 98  Resp: (!) 22 (!) 24  Temp: 97.8 F (36.6 C)   SpO2: 95% 96%    General: Awake, no distress.  CV:  Good peripheral perfusion.  Resp:  Normal effort.  Abd:  No distention.  Other:  Very productive sounding cough.  Speaking short phrases.  Coarse lung sounds with  wheezing throughout all lung fields without focality to exam.  No peripheral edema.  No fever.  Tachycardic and tachypneic.   ED Results / Procedures / Treatments   Labs (all labs ordered are listed, but only abnormal results are displayed) Labs Reviewed  CULTURE, BLOOD (ROUTINE X 2)  CULTURE, BLOOD (ROUTINE X 2)  RESP PANEL BY RT-PCR (RSV, FLU A&B, COVID)  [id number]  LACTIC ACID, PLASMA  COMPREHENSIVE METABOLIC PANEL WITH GFR  CBC WITH DIFFERENTIAL/PLATELET  PROTIME-INR  URINALYSIS, W/ REFLEX TO CULTURE (INFECTION SUSPECTED)     EKG  ED ECG REPORT I, Ginnie Shams, the attending physician, personally viewed and interpreted this ECG.   Date: 01/31/2025  EKG Time: 0520  Rate: 100  Rhythm: sinus tachycardia  Axis: nl  Intervals: nl  ST&T Change: no stemi    RADIOLOGY I independently reviewed and interpreted chest x-ray and see no obvious focality or pneumothorax I also reviewed radiologist's formal read.   PROCEDURES:  Critical Care performed: Yes, see critical care procedure note(s)  Procedures   MEDICATIONS ORDERED IN ED: Medications  lactated ringers  bolus 1,000 mL (has no administration in time range)  doxycycline  (VIBRAMYCIN ) 100 mg in sodium chloride  0.9 % 250 mL IVPB (has no administration in time range)  cefTRIAXone  (ROCEPHIN ) 1 g in sodium chloride  0.9 % 100 mL IVPB (1 g Intravenous New Bag/Given 01/31/25 9347)  oxyCODONE  (Oxy IR/ROXICODONE ) immediate release tablet 10 mg (has no administration in time range)  hydrocortisone  sodium succinate  (SOLU-CORTEF ) 100 MG injection 100 mg (100 mg Intravenous Given 01/31/25 0645)  ipratropium-albuterol  (DUONEB) 0.5-2.5 (3) MG/3ML nebulizer solution 9 mL (9 mLs Nebulization Given 01/31/25 9348)    External physician / consultants:  I spoke with hospital medicine for admission and regarding care plan for this patient.   IMPRESSION / MDM / ASSESSMENT AND PLAN / ED COURSE  I reviewed the triage vital signs and the nursing  notes.                                Patient's presentation is most consistent with acute presentation with potential threat to life or bodily function.  Differential diagnosis includes, but is not limited to, respiratory infection, asthma exacerbation, adrenal crisis, sepsis, considered but less likely PE or ACS  The patient is on the cardiac monitor to evaluate for evidence of arrhythmia and/or significant heart rate changes.  MDM:    This is an immunocompromise patient in the adrenal sufficiency patient here with worsening respiratory infectious symptoms over the last couple of days despite treatment with azithromycin  and breathing treatments.  Given her past medical history and her vital signs modalities including increased respiratory rate and tachycardia I am concerned for sepsis.  I will order sepsis labs including blood cultures, lactic, and check her other basic blood test, chest x-ray, but treat with IV ceftriaxone  and IV doxycycline  as well as bronchodilators DuoNeb treatment and a stress dose of steroids IV Cortef  and admit her.  Considered PE given her history of DVT no longer on anticoagulation but with a very productive sounding cough today, and very frank respiratory infectious symptoms and tight lungs on auscultation I think asthma exacerbation/respiratory infection are her chief issues today.        FINAL CLINICAL IMPRESSION(S) / ED DIAGNOSES   Final diagnoses:  Adrenal insufficiency  Respiratory infection  Productive cough  Asthma with acute exacerbation, unspecified asthma severity, unspecified whether persistent     Rx / DC Orders   ED Discharge Orders     None        Note:  This document was prepared using Dragon voice recognition software and may include unintentional dictation errors.    Cyrena Mylar, MD 01/31/25 [phone]  "

## 2025-04-22 ENCOUNTER — Ambulatory Visit: Admitting: Dermatology
